# Patient Record
Sex: Female | Born: 1940 | Race: White | Hispanic: No | Marital: Single | Smoking: Former smoker
Health system: Southern US, Community
[De-identification: ages and names within clinical notes are randomized; demographics above are authoritative.]

## PROBLEM LIST (undated history)

## (undated) DIAGNOSIS — I10 Essential (primary) hypertension: Secondary | ICD-10-CM

## (undated) DIAGNOSIS — C801 Malignant (primary) neoplasm, unspecified: Secondary | ICD-10-CM

## (undated) DIAGNOSIS — C3490 Malignant neoplasm of unspecified part of unspecified bronchus or lung: Secondary | ICD-10-CM

## (undated) DIAGNOSIS — M1990 Unspecified osteoarthritis, unspecified site: Secondary | ICD-10-CM

## (undated) DIAGNOSIS — I509 Heart failure, unspecified: Secondary | ICD-10-CM

## (undated) DIAGNOSIS — J449 Chronic obstructive pulmonary disease, unspecified: Secondary | ICD-10-CM

## (undated) DIAGNOSIS — H9190 Unspecified hearing loss, unspecified ear: Secondary | ICD-10-CM

## (undated) DIAGNOSIS — K219 Gastro-esophageal reflux disease without esophagitis: Secondary | ICD-10-CM

## (undated) HISTORY — DX: Malignant (primary) neoplasm, unspecified: C80.1

## (undated) HISTORY — DX: Heart failure, unspecified: I50.9

## (undated) HISTORY — PX: CORONARY ARTERY BYPASS GRAFT: SHX141

## (undated) HISTORY — DX: Chronic obstructive pulmonary disease, unspecified: J44.9

## (undated) HISTORY — PX: CYST REMOVAL HAND: SHX6279

---

## 1991-08-01 HISTORY — PX: CARDIAC SURGERY: SHX584

## 2015-10-14 DIAGNOSIS — H905 Unspecified sensorineural hearing loss: Secondary | ICD-10-CM | POA: Insufficient documentation

## 2015-10-14 DIAGNOSIS — I25810 Atherosclerosis of coronary artery bypass graft(s) without angina pectoris: Secondary | ICD-10-CM | POA: Insufficient documentation

## 2015-10-14 DIAGNOSIS — J410 Simple chronic bronchitis: Secondary | ICD-10-CM | POA: Insufficient documentation

## 2015-12-28 DIAGNOSIS — K219 Gastro-esophageal reflux disease without esophagitis: Secondary | ICD-10-CM | POA: Insufficient documentation

## 2015-12-28 DIAGNOSIS — E669 Obesity, unspecified: Secondary | ICD-10-CM | POA: Insufficient documentation

## 2015-12-28 DIAGNOSIS — E782 Mixed hyperlipidemia: Secondary | ICD-10-CM | POA: Insufficient documentation

## 2015-12-28 DIAGNOSIS — G2581 Restless legs syndrome: Secondary | ICD-10-CM | POA: Insufficient documentation

## 2015-12-28 DIAGNOSIS — I10 Essential (primary) hypertension: Secondary | ICD-10-CM | POA: Insufficient documentation

## 2015-12-31 DIAGNOSIS — R1319 Other dysphagia: Secondary | ICD-10-CM | POA: Insufficient documentation

## 2015-12-31 DIAGNOSIS — R1310 Dysphagia, unspecified: Secondary | ICD-10-CM | POA: Insufficient documentation

## 2016-02-04 DIAGNOSIS — K449 Diaphragmatic hernia without obstruction or gangrene: Secondary | ICD-10-CM | POA: Insufficient documentation

## 2016-02-20 DIAGNOSIS — R5383 Other fatigue: Secondary | ICD-10-CM | POA: Insufficient documentation

## 2016-02-20 DIAGNOSIS — Z951 Presence of aortocoronary bypass graft: Secondary | ICD-10-CM | POA: Insufficient documentation

## 2016-02-21 DIAGNOSIS — E041 Nontoxic single thyroid nodule: Secondary | ICD-10-CM | POA: Insufficient documentation

## 2016-02-24 DIAGNOSIS — R269 Unspecified abnormalities of gait and mobility: Secondary | ICD-10-CM | POA: Insufficient documentation

## 2016-03-01 DIAGNOSIS — N183 Chronic kidney disease, stage 3 unspecified: Secondary | ICD-10-CM | POA: Insufficient documentation

## 2016-03-03 DIAGNOSIS — C7951 Secondary malignant neoplasm of bone: Secondary | ICD-10-CM | POA: Insufficient documentation

## 2016-03-08 ENCOUNTER — Other Ambulatory Visit: Payer: Self-pay

## 2016-03-08 DIAGNOSIS — C8590 Non-Hodgkin lymphoma, unspecified, unspecified site: Secondary | ICD-10-CM | POA: Insufficient documentation

## 2016-03-08 DIAGNOSIS — C8190 Hodgkin lymphoma, unspecified, unspecified site: Secondary | ICD-10-CM | POA: Insufficient documentation

## 2016-03-08 DIAGNOSIS — C8339 Diffuse large B-cell lymphoma, extranodal and solid organ sites: Secondary | ICD-10-CM | POA: Insufficient documentation

## 2016-03-28 ENCOUNTER — Encounter: Payer: Self-pay | Admitting: *Deleted

## 2016-03-30 ENCOUNTER — Inpatient Hospital Stay: Payer: Medicare HMO | Attending: Hematology and Oncology | Admitting: Hematology and Oncology

## 2016-03-30 ENCOUNTER — Inpatient Hospital Stay: Payer: Medicare HMO

## 2016-03-30 ENCOUNTER — Encounter (INDEPENDENT_AMBULATORY_CARE_PROVIDER_SITE_OTHER): Payer: Self-pay

## 2016-03-30 ENCOUNTER — Encounter: Payer: Self-pay | Admitting: Hematology and Oncology

## 2016-03-30 DIAGNOSIS — Z801 Family history of malignant neoplasm of trachea, bronchus and lung: Secondary | ICD-10-CM | POA: Diagnosis not present

## 2016-03-30 DIAGNOSIS — R410 Disorientation, unspecified: Secondary | ICD-10-CM

## 2016-03-30 DIAGNOSIS — C8510 Unspecified B-cell lymphoma, unspecified site: Secondary | ICD-10-CM | POA: Diagnosis present

## 2016-03-30 DIAGNOSIS — I2510 Atherosclerotic heart disease of native coronary artery without angina pectoris: Secondary | ICD-10-CM | POA: Diagnosis not present

## 2016-03-30 DIAGNOSIS — E041 Nontoxic single thyroid nodule: Secondary | ICD-10-CM

## 2016-03-30 DIAGNOSIS — Z87891 Personal history of nicotine dependence: Secondary | ICD-10-CM

## 2016-03-30 DIAGNOSIS — Z79899 Other long term (current) drug therapy: Secondary | ICD-10-CM

## 2016-03-30 DIAGNOSIS — R630 Anorexia: Secondary | ICD-10-CM

## 2016-03-30 DIAGNOSIS — R918 Other nonspecific abnormal finding of lung field: Secondary | ICD-10-CM | POA: Diagnosis not present

## 2016-03-30 DIAGNOSIS — M129 Arthropathy, unspecified: Secondary | ICD-10-CM | POA: Diagnosis not present

## 2016-03-30 DIAGNOSIS — Z7982 Long term (current) use of aspirin: Secondary | ICD-10-CM | POA: Diagnosis not present

## 2016-03-30 DIAGNOSIS — C8330 Diffuse large B-cell lymphoma, unspecified site: Secondary | ICD-10-CM

## 2016-03-30 DIAGNOSIS — N19 Unspecified kidney failure: Secondary | ICD-10-CM | POA: Diagnosis not present

## 2016-03-30 DIAGNOSIS — C8580 Other specified types of non-Hodgkin lymphoma, unspecified site: Secondary | ICD-10-CM

## 2016-03-30 DIAGNOSIS — R634 Abnormal weight loss: Secondary | ICD-10-CM

## 2016-03-30 DIAGNOSIS — D649 Anemia, unspecified: Secondary | ICD-10-CM

## 2016-03-30 DIAGNOSIS — E279 Disorder of adrenal gland, unspecified: Secondary | ICD-10-CM | POA: Diagnosis not present

## 2016-03-30 LAB — CBC WITH DIFFERENTIAL/PLATELET
Basophils Absolute: 0.1 10*3/uL (ref 0–0.1)
Basophils Relative: 1 %
Eosinophils Absolute: 0 10*3/uL (ref 0–0.7)
Eosinophils Relative: 0 %
HCT: 28.4 % — ABNORMAL LOW (ref 35.0–47.0)
Hemoglobin: 9.8 g/dL — ABNORMAL LOW (ref 12.0–16.0)
Lymphocytes Relative: 19 %
Lymphs Abs: 2.3 10*3/uL (ref 1.0–3.6)
MCH: 30.8 pg (ref 26.0–34.0)
MCHC: 34.4 g/dL (ref 32.0–36.0)
MCV: 89.3 fL (ref 80.0–100.0)
Monocytes Absolute: 1.2 10*3/uL — ABNORMAL HIGH (ref 0.2–0.9)
Monocytes Relative: 10 %
Neutro Abs: 8.7 10*3/uL — ABNORMAL HIGH (ref 1.4–6.5)
Neutrophils Relative %: 70 %
Platelets: 666 10*3/uL — ABNORMAL HIGH (ref 150–440)
RBC: 3.18 MIL/uL — ABNORMAL LOW (ref 3.80–5.20)
RDW: 14.6 % — ABNORMAL HIGH (ref 11.5–14.5)
WBC: 12.2 10*3/uL — ABNORMAL HIGH (ref 3.6–11.0)

## 2016-03-30 LAB — COMPREHENSIVE METABOLIC PANEL
ALT: 36 U/L (ref 14–54)
AST: 38 U/L (ref 15–41)
Albumin: 3 g/dL — ABNORMAL LOW (ref 3.5–5.0)
Alkaline Phosphatase: 116 U/L (ref 38–126)
Anion gap: 12 (ref 5–15)
BUN: 28 mg/dL — ABNORMAL HIGH (ref 6–20)
CO2: 22 mmol/L (ref 22–32)
Calcium: 10.6 mg/dL — ABNORMAL HIGH (ref 8.9–10.3)
Chloride: 97 mmol/L — ABNORMAL LOW (ref 101–111)
Creatinine, Ser: 1.2 mg/dL — ABNORMAL HIGH (ref 0.44–1.00)
GFR calc Af Amer: 51 mL/min — ABNORMAL LOW (ref >60)
GFR calc non Af Amer: 44 mL/min — ABNORMAL LOW (ref >60)
Glucose, Bld: 105 mg/dL — ABNORMAL HIGH (ref 65–99)
Potassium: 3.4 mmol/L — ABNORMAL LOW (ref 3.5–5.1)
Sodium: 131 mmol/L — ABNORMAL LOW (ref 135–145)
Total Bilirubin: 0.7 mg/dL (ref 0.3–1.2)
Total Protein: 6.8 g/dL (ref 6.5–8.1)

## 2016-03-30 LAB — LACTATE DEHYDROGENASE: LDH: 122 U/L (ref 98–192)

## 2016-03-30 LAB — APTT: aPTT: 36 seconds (ref 24–36)

## 2016-03-30 LAB — PROTIME-INR
INR: 1.01
Prothrombin Time: 13.3 seconds (ref 11.4–15.2)

## 2016-03-30 LAB — URIC ACID: Uric Acid, Serum: 4.5 mg/dL (ref 2.3–6.6)

## 2016-03-30 NOTE — Progress Notes (Signed)
.[facility] day:  03/30/2016  Chief Complaint: Toni Griffith is a 75 y.o. female with B cell lymphoma, unclassifiable, who is referred by Dr. Gwenevere Ghazi for assessment and management.  HPI:  The patient began to lose weight in 09/2015. She has lost about 52 pounds (25 - 30 pounds since 09/2015).  She denied any fevers or sweats.  She described a general decline in health.    She was admitted to [facility] in [place] from 02/20/2016 - 02/26/2016. She presented with hypercalcemia, hyperbilirubinemia, unintentional weight loss, renal failure and a 3.3 cm mass on chest x-ray.  She was felt to have lung cancer.  She had increased confusion and difficulty keeping food down for 1 1/2 months prior to admission. Initial calcium was 12. She was treated with IV fluids, calcitonin, discontinuation of hydrochlorothiazide, and Zometa.  At discharge, creatinine was 1.77, calcium 9.4, and albumin 2.9.  CBC revealed a hematocrit 26.4, hemoglobin 9.1, platelets 247,000, white count 9100.  Chest, abdomen and pelvic CT scan without contrast on 02/20/2016 revealed a 3.7 cm subpleural mass within the left lower lobe. There was a 1.1 cm right lower lobe subpleural nodule as well as a 9 mm nodule within the right upper lobe and 4 mm nodule within the lingula.  Between the left second, third, and fourth ribs there was 1.9 cm soft tissue mass. There was a 2.5 cm low-attenuation nodule in the inferior aspect of the left lobe of the thyroid.   There were prominent left axillary nodes measuring up to 1.0 cm.   There was no definite mediastinal adenopathy. There was a 5.5 x 4.6 cm cystic lesion in the left hepatic lobe.  There was a 3.1 cm nodule in the right adrenal gland. There was a 1.2 cm right retrocaval lymph node with additional prominent subcentimeter retroperitoneal nodes. There was 1.0 cm left common iliac node. There was a 6.2 x 5.2 cm soft tissue  mass within the right ilium.  Head MRI on 02/21/2016 revealed no evidence of malignancy. Thyroid ultrasound on 02/21/2016 revealed a 2.4 cm solid nodule in the inferior aspect of the left lobe  Following discharge, she was seen by Dr. Gwenevere Ghazi, pulmonologist.  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 A large pleural lesion invaded the anterior left chest wall.   There was metastasis to the superior left ocular orbit.  There was metastasis with a large lesion centered in the right iliac wing with soft tissue extension.  There was nodal metastasis with hypermetabolic right external iliac lymph nodes.  Right iliac wing bone/bone marrow biopsy on 03/08/2016 revealed B-cell lymphoma, unclassifiable, with features intermediate between diffuse large B-cell lymphoma and classical Hodgkin's lymphoma ("gray zone" lymphoma).  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  She cannot stand for long periods of time. She is somewhat unsteady on her feet, but has not fallen. She denies any balance or coordination issues. She needs help getting into the shower. She uses a shower chair.  She can dress on her own, but sometimes needs help with her pants. She spends 11 hours a day sleeping (she gets up at 8 AM and goes to sleep at 10 PM; she has an hour nap during the day). She sits most of the day and watches television.   Past Medical History:  Diagnosis Date  .  Cancer [facility])     Past Surgical History:  Procedure Laterality Date  . CARDIAC SURGERY  1993    Family History  Problem Relation Age of Onset  . Lung cancer Mother   . Lung cancer Father   . Diabetes Maternal Uncle   . Myasthenia gravis Maternal Grandmother   . Leukemia Maternal Grandfather     Social History:  reports that she has quit smoking. She has never used smokeless tobacco. She reports that she  drinks alcohol. Her drug history is not on file.  She smoked 1 1/2 packs per day for 20 years (30 pack year smoking history) until 3 months ago.  She is hearing impaired (deaf).  She lives in [place] with her boyfriend.  Her daughter lives in [place]. The patient is accompanied by her daughter [name]; mobile: [phone]; home: [phone]), son [name]), boyfriend [name]), and interpreter (sign language) today.  Allergies: No Known Allergies  Current Medications: Current Outpatient Prescriptions  Medication Sig Dispense Refill  . acetaminophen (TYLENOL) 325 MG tablet Take 650 mg by mouth.    Marland Kitchen atorvastatin (LIPITOR) 80 MG tablet Take 80 mg by mouth.    . Cholecalciferol (VITAMIN D3) 2000 units capsule Take by mouth.    Marland Kitchen FLUoxetine (PROZAC) 40 MG capsule Take 40 mg by mouth.    . Garlic 4982 MG CAPS Take by mouth.    . naproxen sodium (ANAPROX) 220 MG tablet Take 220 mg by mouth as needed.    . pantoprazole (PROTONIX) 40 MG tablet Take 40 mg by mouth.    . triamterene-hydrochlorothiazide (DYAZIDE) 37.5-25 MG capsule Take by mouth.    . vitamin E 400 UNIT capsule Take by mouth.    Marland Kitchen aspirin (GOODSENSE ASPIRIN) 325 MG tablet Take 325 mg by mouth.    Marland Kitchen aspirin EC 81 MG tablet Take by mouth.     No current facility-administered medications for this visit.     Review of Systems:  GENERAL:  Fatigue.  Minimally active.  No fevers or sweats.  Weight loss of 52 pounds (25-30 pound since 09/2015). PERFORMANCE STATUS (ECOG):  2-3 HEENT:  No visual changes, runny nose, sore throat, mouth sores or tenderness. Lungs: No shortness of breath or cough.  No hemoptysis. Cardiac:  No chest pain, palpitations, orthopnea, or PND. GI:  Nausea and vomiting, resolved.  No diarrhea, constipation, melena or hematochezia. GU:  No urgency, frequency, dysuria, or hematuria. Musculoskeletal:  Bilateral leg pain (left > right).  Pain in knees and feet.  No back pain.  No muscle tenderness. Extremities:   No pain or swelling. Skin:  No rashes or skin changes. Neuro:  No headache, numbness or weakness, balance or coordination issues. Endocrine:  No diabetes, thyroid issues, hot flashes or night sweats. Psych:  No mood changes, depression or anxiety. Pain:  No focal pain. Review of systems:  All other systems reviewed and found to be negative.  Physical Exam: Blood pressure 126/81, pulse 79, temperature 98.3 F (36.8 C), temperature source Oral, resp. rate 18, height '5\' 2"'  (1.575 m), weight 145 lb 15.1 oz (66.2 kg). GENERAL:  Elderly sitting comfortably in a wheelchair in the exam room in no acute distress.  Slow gait.  She needs assistance onto the table. MENTAL STATUS:  Alert and oriented to person, place and time. HEAD: Curly gray hair.  Normocephalic, atraumatic, face symmetric, no Cushingoid features. EYES:  Hazel eyes.  Pupils equal round and reactive to light and accomodation.  No conjunctivitis or scleral icterus.  ENT:  Oropharynx clear without lesion.  Upper dentures.  Tongue normal. Mucous membranes moist.  RESPIRATORY:  Clear to auscultation without rales, wheezes or rhonchi. CARDIOVASCULAR:  Regular rate and rhythm without murmur, rub or gallop. ABDOMEN:  Soft, non-tender, with active bowel sounds, and no appreciable hepatosplenomegaly.  No masses. SKIN:  No rashes, ulcers or lesions. EXTREMITIES: No edema, no skin discoloration or tenderness.  No palpable cords. LYMPH NODES: Small < 1 cm axillary nodes.  Shotty inguinal adenopathy.  No palpable cervical, supraclavicular adenopathy  NEUROLOGICAL: Alert & oriented, cranial nerves II-XII intact; motor strength 5/5 throughout; sensation intact; finger to nose and RAM normal; slow gait. PSYCH:  Appropriate.   No visits with results within 3 Day(s) from this visit.  Latest known visit with results is:  No results found for any previous visit.    Assessment:  Toni Griffith is a 75 y.o. female with stage IVB B-cell lymphoma,  unclassifiable, with features intermediate between diffuse large B-cell lymphoma and classical Hodgkin's lymphoma ("gray zone" lymphoma).  She underwent right iliac wing bone/bone marrow biopsy on 03/08/2016.  Chest, abdomen and pelvic CT scan without contrast on 02/20/2016 revealed a 3.7 cm subpleural mass within the left lower lobe, a 1.1 cm right lower lobe subpleural nodule , 9 mm RUL nodule, and 0.4 cm lingular nodule.  Between the left second, third, and fourth ribs there was 1.9 cm soft tissue mass. There was a 2.5 cm low-attenuation nodule in the left lobe of the thyroid.   There were prominent left axillary nodes (up to 1.0 cm).   There was no definite mediastinal adenopathy. There was a 5.5 x 4.6 cm cystic lesion in the left hepatic lobe.  There was a 3.1 cm nodule in the right adrenal gland. There was a 1.2 cm right retrocaval lymph node with additional prominent subcentimeter retroperitoneal nodes. There was 1.0 cm left common iliac node. There was a 6.2 x 5.2 cm soft tissue mass within the right ilium.  Head MRI on 02/21/2016 revealed no evidence of malignancy. 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   There was activity in the superior left ocular orbit.  There was a large lesion centered in the right iliac wing with soft tissue extension.  There were hypermetabolic right external iliac lymph nodes  She was admitted to [facility].  She had renal failure and hypercalcemia. She was treated with IVF, calcitonin and Zometa.The patient has a history of coronary artery disease s/p CABG in 1993.  She has no history of heart failure.  Symptomatically, she has lost 52 pounds (25 - 30 pounds since 09/2015).  She has had a  general decline in health.  Plan: 1.  Review diagnosis, staging, and management of lymphoma.  Discuss unusual pathology.  Slides have been requested for review and confirmation of diagnosis.  Based on bone and bone marrow disease and symptoms (weight loss), she has stage IVB disease.  Discuss treatment with RCHOP or EPOCH.  Given patient's age and performance status, discuss consideration of mini-RCHOP with dose advancement as tolerated.  Discuss possible LP to assess for CNS involvement (given diffuse large B cell lymphoma with marrow involvement) and consideration of CNS prophylaxis (MTX).    Discuss reviewing outside imaging.  Discuss  RCHOP/mini-RCHOP.  Discuss chemotherapy every 3 weeks with Neulasta support.  Side effects of chemotherapy reviewed in detail.  Schedule chemotherapy teaching.  Discuss port-a-cath placement.  Discuss issues with tumor lysis syndrome.  Discuss importance of hydration and allopurinol.  Discuss management of nausea.  2.  Labs today:  CBC with diff, CMP, LDH, uric acid, G6PD assay, hepatitis B core antibody total, hepatitis B surface antigen, hepatitis C antibody, PT, PTT. 3.  Schedule echo:  assess EF prior to chemo. 4.  Schedule port-a-cath placement 5.  Chemo class- mini RCHOP 6.  Pathology request from [facility]. 7.  RTC in 1 week for MD assess, review of testing, and RCHOP the next day.   Lequita Asal, MD  03/30/2016, 12:32 PM

## 2016-03-31 ENCOUNTER — Encounter: Payer: Self-pay | Admitting: *Deleted

## 2016-03-31 ENCOUNTER — Telehealth: Payer: Self-pay | Admitting: *Deleted

## 2016-03-31 ENCOUNTER — Other Ambulatory Visit: Payer: Self-pay | Admitting: Hematology and Oncology

## 2016-03-31 LAB — GLUCOSE 6 PHOSPHATE DEHYDROGENASE
G6PDH: 10.4 U/g{Hb} (ref 4.6–13.5)
Hemoglobin: 9.8 g/dL — ABNORMAL LOW (ref 11.1–15.9)

## 2016-03-31 LAB — HEPATITIS B SURFACE ANTIGEN: Hepatitis B Surface Ag: NEGATIVE

## 2016-03-31 LAB — HEPATITIS B CORE ANTIBODY, TOTAL: Hep B Core Total Ab: NEGATIVE

## 2016-03-31 LAB — HEPATITIS C ANTIBODY: HCV Ab: <0.1 s/co ratio (ref 0.0–0.9)

## 2016-03-31 NOTE — Telephone Encounter (Signed)
Called to report that her correct dose of ASA is 325 mg daily

## 2016-04-04 ENCOUNTER — Inpatient Hospital Stay: Payer: Medicare HMO | Attending: Hematology and Oncology

## 2016-04-04 ENCOUNTER — Other Ambulatory Visit: Payer: Self-pay | Admitting: Vascular Surgery

## 2016-04-04 ENCOUNTER — Ambulatory Visit
Admission: RE | Admit: 2016-04-04 | Discharge: 2016-04-04 | Disposition: A | Discharge status: Home / Self Care | Payer: Medicare HMO | Source: Ambulatory Visit | Attending: Hematology and Oncology | Admitting: Hematology and Oncology

## 2016-04-04 DIAGNOSIS — R509 Fever, unspecified: Secondary | ICD-10-CM | POA: Insufficient documentation

## 2016-04-04 DIAGNOSIS — C8580 Other specified types of non-Hodgkin lymphoma, unspecified site: Secondary | ICD-10-CM

## 2016-04-04 DIAGNOSIS — Z87891 Personal history of nicotine dependence: Secondary | ICD-10-CM | POA: Diagnosis not present

## 2016-04-04 DIAGNOSIS — I517 Cardiomegaly: Secondary | ICD-10-CM | POA: Insufficient documentation

## 2016-04-04 DIAGNOSIS — Z7982 Long term (current) use of aspirin: Secondary | ICD-10-CM | POA: Insufficient documentation

## 2016-04-04 DIAGNOSIS — R05 Cough: Secondary | ICD-10-CM | POA: Insufficient documentation

## 2016-04-04 DIAGNOSIS — C8518 Unspecified B-cell lymphoma, lymph nodes of multiple sites: Secondary | ICD-10-CM | POA: Insufficient documentation

## 2016-04-04 DIAGNOSIS — I2510 Atherosclerotic heart disease of native coronary artery without angina pectoris: Secondary | ICD-10-CM | POA: Insufficient documentation

## 2016-04-04 DIAGNOSIS — I340 Nonrheumatic mitral (valve) insufficiency: Secondary | ICD-10-CM | POA: Insufficient documentation

## 2016-04-04 DIAGNOSIS — I071 Rheumatic tricuspid insufficiency: Secondary | ICD-10-CM | POA: Insufficient documentation

## 2016-04-04 DIAGNOSIS — C8330 Diffuse large B-cell lymphoma, unspecified site: Secondary | ICD-10-CM

## 2016-04-04 DIAGNOSIS — Z5111 Encounter for antineoplastic chemotherapy: Secondary | ICD-10-CM | POA: Insufficient documentation

## 2016-04-04 DIAGNOSIS — H9193 Unspecified hearing loss, bilateral: Secondary | ICD-10-CM | POA: Insufficient documentation

## 2016-04-04 DIAGNOSIS — N19 Unspecified kidney failure: Secondary | ICD-10-CM | POA: Insufficient documentation

## 2016-04-04 DIAGNOSIS — Z5112 Encounter for antineoplastic immunotherapy: Secondary | ICD-10-CM | POA: Insufficient documentation

## 2016-04-04 DIAGNOSIS — Z79899 Other long term (current) drug therapy: Secondary | ICD-10-CM | POA: Insufficient documentation

## 2016-04-04 DIAGNOSIS — R918 Other nonspecific abnormal finding of lung field: Secondary | ICD-10-CM | POA: Insufficient documentation

## 2016-04-04 DIAGNOSIS — R634 Abnormal weight loss: Secondary | ICD-10-CM | POA: Insufficient documentation

## 2016-04-04 DIAGNOSIS — Z801 Family history of malignant neoplasm of trachea, bronchus and lung: Secondary | ICD-10-CM | POA: Insufficient documentation

## 2016-04-04 DIAGNOSIS — D649 Anemia, unspecified: Secondary | ICD-10-CM | POA: Insufficient documentation

## 2016-04-04 DIAGNOSIS — E876 Hypokalemia: Secondary | ICD-10-CM | POA: Insufficient documentation

## 2016-04-04 NOTE — Progress Notes (Signed)
  Echocardiogram 2D Echocardiogram has been performed.  Jennette Dubin 04/04/2016, 12:09 PM

## 2016-04-04 NOTE — Patient Instructions (Signed)
Rituximab injection What is this medicine? RITUXIMAB (ri TUX i mab) is a monoclonal antibody. It is used commonly to treat non-Hodgkin lymphoma and other conditions. It is also used to treat rheumatoid arthritis (RA). In RA, this medicine slows the inflammatory process and help reduce joint pain and swelling. This medicine is often used with other cancer or arthritis medications. This medicine may be used for other purposes; ask your health care provider or pharmacist if you have questions. What should I tell my health care provider before I take this medicine? They need to know if you have any of these conditions: -blood disorders -heart disease -history of hepatitis B -infection (especially a virus infection such as chickenpox, cold sores, or herpes) -irregular heartbeat -kidney disease -lung or breathing disease, like asthma -lupus -an unusual or allergic reaction to rituximab, mouse proteins, other medicines, foods, dyes, or preservatives -pregnant or trying to get pregnant -breast-feeding How should I use this medicine? This medicine is for infusion into a vein. It is administered in a hospital or clinic by a specially trained health care professional. A special MedGuide will be given to you by the pharmacist with each prescription and refill. Be sure to read this information carefully each time. Talk to your pediatrician regarding the use of this medicine in children. This medicine is not approved for use in children. Overdosage: If you think you have taken too much of this medicine contact a poison control center or emergency room at once. NOTE: This medicine is only for you. Do not share this medicine with others. What if I miss a dose? It is important not to miss a dose. Call your doctor or health care professional if you are unable to keep an appointment. What may interact with this medicine? -cisplatin -medicines for blood pressure -some other medicines for  arthritis -vaccines This list may not describe all possible interactions. Give your health care provider a list of all the medicines, herbs, non-prescription drugs, or dietary supplements you use. Also tell them if you smoke, drink alcohol, or use illegal drugs. Some items may interact with your medicine. What should I watch for while using this medicine? Report any side effects that you notice during your treatment right away, such as changes in your breathing, fever, chills, dizziness or lightheadedness. These effects are more common with the first dose. Visit your prescriber or health care professional for checks on your progress. You will need to have regular blood work. Report any other side effects. The side effects of this medicine can continue after you finish your treatment. Continue your course of treatment even though you feel ill unless your doctor tells you to stop. Call your doctor or health care professional for advice if you get a fever, chills or sore throat, or other symptoms of a cold or flu. Do not treat yourself. This drug decreases your body's ability to fight infections. Try to avoid being around people who are sick. This medicine may increase your risk to bruise or bleed. Call your doctor or health care professional if you notice any unusual bleeding. Be careful brushing and flossing your teeth or using a toothpick because you may get an infection or bleed more easily. If you have any dental work done, tell your dentist you are receiving this medicine. Avoid taking products that contain aspirin, acetaminophen, ibuprofen, naproxen, or ketoprofen unless instructed by your doctor. These medicines may hide a fever. Do not become pregnant while taking this medicine. Women should inform their doctor if  they wish to become pregnant or think they might be pregnant. There is a potential for serious side effects to an unborn child. Talk to your health care professional or pharmacist for more  information. Do not breast-feed an infant while taking this medicine. What side effects may I notice from receiving this medicine? Side effects that you should report to your doctor or health care professional as soon as possible: -allergic reactions like skin rash, itching or hives, swelling of the face, lips, or tongue -low blood counts - this medicine may decrease the number of white blood cells, red blood cells and platelets. You may be at increased risk for infections and bleeding. -signs of infection - fever or chills, cough, sore throat, pain or difficulty passing urine -signs of decreased platelets or bleeding - bruising, pinpoint red spots on the skin, black, tarry stools, blood in the urine -signs of decreased red blood cells - unusually weak or tired, fainting spells, lightheadedness -breathing problems -confused, not responsive -chest pain -fast, irregular heartbeat -feeling faint or lightheaded, falls -mouth sores -redness, blistering, peeling or loosening of the skin, including inside the mouth -stomach pain -swelling of the ankles, feet, or hands -trouble passing urine or change in the amount of urine Side effects that usually do not require medical attention (report to your doctor or other health care professional if they continue or are bothersome): -anxiety -headache -loss of appetite -muscle aches -nausea -night sweats This list may not describe all possible side effects. Call your doctor for medical advice about side effects. You may report side effects to FDA at 1-800-FDA-1088. Where should I keep my medicine? This drug is given in a hospital or clinic and will not be stored at home. NOTE: This sheet is a summary. It may not cover all possible information. If you have questions about this medicine, talk to your doctor, pharmacist, or health care provider.    2016, [employer]. (2014-09-23 22:30:56) Vincristine injection What is this  medicine? VINCRISTINE (vin KRIS teen) is a chemotherapy drug. It slows the growth of cancer cells. This medicine is used to treat many types of cancer like Hodgkin's disease, leukemia, non-Hodgkin's lymphoma, neuroblastoma (brain cancer), rhabdomyosarcoma, and Wilms' tumor. This medicine may be used for other purposes; ask your health care provider or pharmacist if you have questions. What should I tell my health care provider before I take this medicine? They need to know if you have any of these conditions: -blood disorders -gout -infection (especially chickenpox, cold sores, or herpes) -kidney disease -liver disease -lung disease -nervous system disease like Charcot-Marie-Tooth (CMT) -recent or ongoing radiation therapy -an unusual or allergic reaction to vincristine, other chemotherapy agents, other medicines, foods, dyes, or preservatives -pregnant or trying to get pregnant -breast-feeding How should I use this medicine? This drug is given as an infusion into a vein. It is administered in a hospital or clinic by a specially trained health care professional. If you have pain, swelling, burning, or any unusual feeling around the site of your injection, tell your health care professional right away. Talk to your pediatrician regarding the use of this medicine in children. While this drug may be prescribed for selected conditions, precautions do apply. Overdosage: If you think you have taken too much of this medicine contact a poison control center or emergency room at once. NOTE: This medicine is only for you. Do not share this medicine with others. What if I miss a dose? It is important not to miss your dose. Call  your doctor or health care professional if you are unable to keep an appointment. What may interact with this medicine? Do not take this medicine with any of the following medications: -itraconazole -mibefradil -voriconazole This medicine may also interact with the following  medications: -cyclosporine -erythromycin -fluconazole -ketoconazole -medicines for HIV like delavirdine, efavirenz, nevirapine -medicines for seizures like ethotoin, fosphenotoin, phenytoin -medicines to increase blood counts like filgrastim, pegfilgrastim, sargramostim -other chemotherapy drugs like cisplatin, L-asparaginase, methotrexate, mitomycin, paclitaxel -pegaspargase -vaccines -zalcitabine, ddC Talk to your doctor or health care professional before taking any of these medicines: -acetaminophen -aspirin -ibuprofen -ketoprofen -naproxen This list may not describe all possible interactions. Give your health care provider a list of all the medicines, herbs, non-prescription drugs, or dietary supplements you use. Also tell them if you smoke, drink alcohol, or use illegal drugs. Some items may interact with your medicine. What should I watch for while using this medicine? Your condition will be monitored carefully while you are receiving this medicine. You will need important blood work done while you are taking this medicine. This drug may make you feel generally unwell. This is not uncommon, as chemotherapy can affect healthy cells as well as cancer cells. Report any side effects. Continue your course of treatment even though you feel ill unless your doctor tells you to stop. In some cases, you may be given additional medicines to help with side effects. Follow all directions for their use. Call your doctor or health care professional for advice if you get a fever, chills or sore throat, or other symptoms of a cold or flu. Do not treat yourself. Avoid taking products that contain aspirin, acetaminophen, ibuprofen, naproxen, or ketoprofen unless instructed by your doctor. These medicines may hide a fever. Do not become pregnant while taking this medicine. Women should inform their doctor if they wish to become pregnant or think they might be pregnant. There is a potential for serious  side effects to an unborn child. Talk to your health care professional or pharmacist for more information. Do not breast-feed an infant while taking this medicine. Men may have a lower sperm count while taking this medicine. Talk to your doctor if you plan to father a child. What side effects may I notice from receiving this medicine? Side effects that you should report to your doctor or health care professional as soon as possible: -allergic reactions like skin rash, itching or hives, swelling of the face, lips, or tongue -breathing problems -confusion or changes in emotions or moods -constipation -cough -mouth sores -muscle weakness -nausea and vomiting -pain, swelling, redness or irritation at the injection site -pain, tingling, numbness in the hands or feet -problems with balance, talking, walking -seizures -stomach pain -trouble passing urine or change in the amount of urine Side effects that usually do not require medical attention (report to your doctor or health care professional if they continue or are bothersome): -diarrhea -hair loss -jaw pain -loss of appetite This list may not describe all possible side effects. Call your doctor for medical advice about side effects. You may report side effects to FDA at 1-800-FDA-1088. Where should I keep my medicine? This drug is given in a hospital or clinic and will not be stored at home. NOTE: This sheet is a summary. It may not cover all possible information. If you have questions about this medicine, talk to your doctor, pharmacist, or health care provider.    2016, [employer] Standard. (2008-04-13 17:17:13) Doxorubicin injection What is this medicine? DOXORUBICIN (dox oh  ROO bi sin) is a chemotherapy drug. It is used to treat many kinds of cancer like Hodgkin's disease, leukemia, non-Hodgkin's lymphoma, neuroblastoma, sarcoma, and Wilms' tumor. It is also used to treat bladder cancer, breast cancer, lung cancer, ovarian cancer,  stomach cancer, and thyroid cancer. This medicine may be used for other purposes; ask your health care provider or pharmacist if you have questions. What should I tell my health care provider before I take this medicine? They need to know if you have any of these conditions: -blood disorders -heart disease, recent heart attack -infection (especially a virus infection such as chickenpox, cold sores, or herpes) -irregular heartbeat -liver disease -recent or ongoing radiation therapy -an unusual or allergic reaction to doxorubicin, other chemotherapy agents, other medicines, foods, dyes, or preservatives -pregnant or trying to get pregnant -breast-feeding How should I use this medicine? This drug is given as an infusion into a vein. It is administered in a hospital or clinic by a specially trained health care professional. If you have pain, swelling, burning or any unusual feeling around the site of your injection, tell your health care professional right away. Talk to your pediatrician regarding the use of this medicine in children. Special care may be needed. Overdosage: If you think you have taken too much of this medicine contact a poison control center or emergency room at once. NOTE: This medicine is only for you. Do not share this medicine with others. What if I miss a dose? It is important not to miss your dose. Call your doctor or health care professional if you are unable to keep an appointment. What may interact with this medicine? Do not take this medicine with any of the following medications: -cisapride -droperidol -halofantrine -pimozide -zidovudine This medicine may also interact with the following medications: -chloroquine -chlorpromazine -clarithromycin -cyclophosphamide -cyclosporine -erythromycin -medicines for depression, anxiety, or psychotic disturbances -medicines for irregular heart beat like amiodarone, bepridil, dofetilide, encainide, flecainide,  propafenone, quinidine -medicines for seizures like ethotoin, fosphenytoin, phenytoin -medicines for nausea, vomiting like dolasetron, ondansetron, palonosetron -medicines to increase blood counts like filgrastim, pegfilgrastim, sargramostim -methadone -methotrexate -pentamidine -progesterone -vaccines -verapamil Talk to your doctor or health care professional before taking any of these medicines: -acetaminophen -aspirin -ibuprofen -ketoprofen -naproxen This list may not describe all possible interactions. Give your health care provider a list of all the medicines, herbs, non-prescription drugs, or dietary supplements you use. Also tell them if you smoke, drink alcohol, or use illegal drugs. Some items may interact with your medicine. What should I watch for while using this medicine? Your condition will be monitored carefully while you are receiving this medicine. You will need important blood work done while you are taking this medicine. This drug may make you feel generally unwell. This is not uncommon, as chemotherapy can affect healthy cells as well as cancer cells. Report any side effects. Continue your course of treatment even though you feel ill unless your doctor tells you to stop. Your urine may turn red for a few days after your dose. This is not blood. If your urine is dark or brown, call your doctor. In some cases, you may be given additional medicines to help with side effects. Follow all directions for their use. Call your doctor or health care professional for advice if you get a fever, chills or sore throat, or other symptoms of a cold or flu. Do not treat yourself. This drug decreases your body's ability to fight infections. Try to avoid being around people who  are sick. This medicine may increase your risk to bruise or bleed. Call your doctor or health care professional if you notice any unusual bleeding. Be careful brushing and flossing your teeth or using a toothpick  because you may get an infection or bleed more easily. If you have any dental work done, tell your dentist you are receiving this medicine. Avoid taking products that contain aspirin, acetaminophen, ibuprofen, naproxen, or ketoprofen unless instructed by your doctor. These medicines may hide a fever. Men and women of childbearing age should use effective birth control methods while using taking this medicine. Do not become pregnant while taking this medicine. There is a potential for serious side effects to an unborn child. Talk to your health care professional or pharmacist for more information. Do not breast-feed an infant while taking this medicine. Do not let others touch your urine or other body fluids for 5 days after each treatment with this medicine. Caregivers should wear latex gloves to avoid touching body fluids during this time. There is a maximum amount of this medicine you should receive throughout your life. The amount depends on the medical condition being treated and your overall health. Your doctor will watch how much of this medicine you receive in your lifetime. Tell your doctor if you have taken this medicine before. What side effects may I notice from receiving this medicine? Side effects that you should report to your doctor or health care professional as soon as possible: -allergic reactions like skin rash, itching or hives, swelling of the face, lips, or tongue -low blood counts - this medicine may decrease the number of white blood cells, red blood cells and platelets. You may be at increased risk for infections and bleeding. -signs of infection - fever or chills, cough, sore throat, pain or difficulty passing urine -signs of decreased platelets or bleeding - bruising, pinpoint red spots on the skin, black, tarry stools, blood in the urine -signs of decreased red blood cells - unusually weak or tired, fainting spells, lightheadedness -breathing problems -chest pain -fast,  irregular heartbeat -mouth sores -nausea, vomiting -pain, swelling, redness at site where injected -pain, tingling, numbness in the hands or feet -swelling of ankles, feet, or hands -unusual bleeding or bruising Side effects that usually do not require medical attention (report to your doctor or health care professional if they continue or are bothersome): -diarrhea -facial flushing -hair loss -loss of appetite -missed menstrual periods -nail discoloration or damage -red or watery eyes -red colored urine -stomach upset This list may not describe all possible side effects. Call your doctor for medical advice about side effects. You may report side effects to FDA at 1-800-FDA-1088. Where should I keep my medicine? This drug is given in a hospital or clinic and will not be stored at home. NOTE: This sheet is a summary. It may not cover all possible information. If you have questions about this medicine, talk to your doctor, pharmacist, or health care provider.    2016, [employer] Standard. (2012-11-12 09:54:34) Cyclophosphamide injection What is this medicine? CYCLOPHOSPHAMIDE (sye kloe FOSS fa mide) is a chemotherapy drug. It slows the growth of cancer cells. This medicine is used to treat many types of cancer like lymphoma, myeloma, leukemia, breast cancer, and ovarian cancer, to name a few. This medicine may be used for other purposes; ask your health care provider or pharmacist if you have questions. What should I tell my health care provider before I take this medicine? They need to know if you  have any of these conditions: -blood disorders -history of other chemotherapy -infection -kidney disease -liver disease -recent or ongoing radiation therapy -tumors in the bone marrow -an unusual or allergic reaction to cyclophosphamide, other chemotherapy, other medicines, foods, dyes, or preservatives -pregnant or trying to get pregnant -breast-feeding How should I use this  medicine? This drug is usually given as an injection into a vein or muscle or by infusion into a vein. It is administered in a hospital or clinic by a specially trained health care professional. Talk to your pediatrician regarding the use of this medicine in children. Special care may be needed. Overdosage: If you think you have taken too much of this medicine contact a poison control center or emergency room at once. NOTE: This medicine is only for you. Do not share this medicine with others. What if I miss a dose? It is important not to miss your dose. Call your doctor or health care professional if you are unable to keep an appointment. What may interact with this medicine? This medicine may interact with the following medications: -amiodarone -amphotericin B -azathioprine -certain antiviral medicines for HIV or AIDS such as protease inhibitors (e.g., indinavir, ritonavir) and zidovudine -certain blood pressure medications such as benazepril, captopril, enalapril, fosinopril, lisinopril, moexipril, monopril, perindopril, quinapril, ramipril, trandolapril -certain cancer medications such as anthracyclines (e.g., daunorubicin, doxorubicin), busulfan, cytarabine, paclitaxel, pentostatin, tamoxifen, trastuzumab -certain diuretics such as chlorothiazide, chlorthalidone, hydrochlorothiazide, indapamide, metolazone -certain medicines that treat or prevent blood clots like warfarin -certain muscle relaxants such as succinylcholine -cyclosporine -etanercept -indomethacin -medicines to increase blood counts like filgrastim, pegfilgrastim, sargramostim -medicines used as general anesthesia -metronidazole -natalizumab This list may not describe all possible interactions. Give your health care provider a list of all the medicines, herbs, non-prescription drugs, or dietary supplements you use. Also tell them if you smoke, drink alcohol, or use illegal drugs. Some items may interact with your  medicine. What should I watch for while using this medicine? Visit your doctor for checks on your progress. This drug may make you feel generally unwell. This is not uncommon, as chemotherapy can affect healthy cells as well as cancer cells. Report any side effects. Continue your course of treatment even though you feel ill unless your doctor tells you to stop. Drink water or other fluids as directed. Urinate often, even at night. In some cases, you may be given additional medicines to help with side effects. Follow all directions for their use. Call your doctor or health care professional for advice if you get a fever, chills or sore throat, or other symptoms of a cold or flu. Do not treat yourself. This drug decreases your body's ability to fight infections. Try to avoid being around people who are sick. This medicine may increase your risk to bruise or bleed. Call your doctor or health care professional if you notice any unusual bleeding. Be careful brushing and flossing your teeth or using a toothpick because you may get an infection or bleed more easily. If you have any dental work done, tell your dentist you are receiving this medicine. You may get drowsy or dizzy. Do not drive, use machinery, or do anything that needs mental alertness until you know how this medicine affects you. Do not become pregnant while taking this medicine or for 1 year after stopping it. Women should inform their doctor if they wish to become pregnant or think they might be pregnant. Men should not father a child while taking this medicine and for 4 months  after stopping it. There is a potential for serious side effects to an unborn child. Talk to your health care professional or pharmacist for more information. Do not breast-feed an infant while taking this medicine. This medicine may interfere with the ability to have a child. This medicine has caused ovarian failure in some women. This medicine has caused reduced sperm  counts in some men. You should talk with your doctor or health care professional if you are concerned about your fertility. If you are going to have surgery, tell your doctor or health care professional that you have taken this medicine. What side effects may I notice from receiving this medicine? Side effects that you should report to your doctor or health care professional as soon as possible: -allergic reactions like skin rash, itching or hives, swelling of the face, lips, or tongue -low blood counts - this medicine may decrease the number of white blood cells, red blood cells and platelets. You may be at increased risk for infections and bleeding. -signs of infection - fever or chills, cough, sore throat, pain or difficulty passing urine -signs of decreased platelets or bleeding - bruising, pinpoint red spots on the skin, black, tarry stools, blood in the urine -signs of decreased red blood cells - unusually weak or tired, fainting spells, lightheadedness -breathing problems -dark urine -dizziness -palpitations -swelling of the ankles, feet, hands -trouble passing urine or change in the amount of urine -weight gain -yellowing of the eyes or skin Side effects that usually do not require medical attention (report to your doctor or health care professional if they continue or are bothersome): -changes in nail or skin color -hair loss -missed menstrual periods -mouth sores -nausea, vomiting This list may not describe all possible side effects. Call your doctor for medical advice about side effects. You may report side effects to FDA at 1-800-FDA-1088. Where should I keep my medicine? This drug is given in a hospital or clinic and will not be stored at home. NOTE: This sheet is a summary. It may not cover all possible information. If you have questions about this medicine, talk to your doctor, pharmacist, or health care provider.    2016, Elsevier/Gold Standard. (2012-05-31 16:22:58)

## 2016-04-05 ENCOUNTER — Other Ambulatory Visit: Payer: Self-pay | Admitting: *Deleted

## 2016-04-05 DIAGNOSIS — C8580 Other specified types of non-Hodgkin lymphoma, unspecified site: Principal | ICD-10-CM

## 2016-04-05 DIAGNOSIS — C8330 Diffuse large B-cell lymphoma, unspecified site: Secondary | ICD-10-CM

## 2016-04-06 ENCOUNTER — Telehealth: Payer: Self-pay | Admitting: *Deleted

## 2016-04-06 ENCOUNTER — Other Ambulatory Visit: Payer: Self-pay | Admitting: *Deleted

## 2016-04-06 ENCOUNTER — Encounter: Payer: Self-pay | Admitting: Hematology and Oncology

## 2016-04-06 ENCOUNTER — Inpatient Hospital Stay (HOSPITAL_BASED_OUTPATIENT_CLINIC_OR_DEPARTMENT_OTHER): Payer: Medicare HMO | Admitting: Hematology and Oncology

## 2016-04-06 ENCOUNTER — Encounter: Payer: Self-pay | Admitting: *Deleted

## 2016-04-06 ENCOUNTER — Encounter
Admission: RE | Disposition: A | Discharge status: Home / Self Care | Payer: Self-pay | Source: Ambulatory Visit | Attending: Vascular Surgery

## 2016-04-06 ENCOUNTER — Inpatient Hospital Stay: Payer: Medicare HMO

## 2016-04-06 ENCOUNTER — Ambulatory Visit
Admission: RE | Admit: 2016-04-06 | Discharge: 2016-04-06 | Disposition: A | Discharge status: Home / Self Care | Payer: Medicare HMO | Source: Ambulatory Visit | Attending: Vascular Surgery | Admitting: Vascular Surgery

## 2016-04-06 VITALS — BP 133/79 | HR 77 | Temp 95.3°F | Resp 18 | Wt 144.0 lb

## 2016-04-06 DIAGNOSIS — Z87891 Personal history of nicotine dependence: Secondary | ICD-10-CM | POA: Insufficient documentation

## 2016-04-06 DIAGNOSIS — C8330 Diffuse large B-cell lymphoma, unspecified site: Secondary | ICD-10-CM

## 2016-04-06 DIAGNOSIS — R509 Fever, unspecified: Secondary | ICD-10-CM | POA: Diagnosis not present

## 2016-04-06 DIAGNOSIS — N19 Unspecified kidney failure: Secondary | ICD-10-CM | POA: Diagnosis not present

## 2016-04-06 DIAGNOSIS — Z8489 Family history of other specified conditions: Secondary | ICD-10-CM | POA: Diagnosis not present

## 2016-04-06 DIAGNOSIS — F17210 Nicotine dependence, cigarettes, uncomplicated: Secondary | ICD-10-CM | POA: Diagnosis not present

## 2016-04-06 DIAGNOSIS — I2510 Atherosclerotic heart disease of native coronary artery without angina pectoris: Secondary | ICD-10-CM

## 2016-04-06 DIAGNOSIS — C8198 Hodgkin lymphoma, unspecified, lymph nodes of multiple sites: Secondary | ICD-10-CM

## 2016-04-06 DIAGNOSIS — C8190 Hodgkin lymphoma, unspecified, unspecified site: Secondary | ICD-10-CM | POA: Insufficient documentation

## 2016-04-06 DIAGNOSIS — Z7982 Long term (current) use of aspirin: Secondary | ICD-10-CM

## 2016-04-06 DIAGNOSIS — C8518 Unspecified B-cell lymphoma, lymph nodes of multiple sites: Secondary | ICD-10-CM

## 2016-04-06 DIAGNOSIS — C8580 Other specified types of non-Hodgkin lymphoma, unspecified site: Principal | ICD-10-CM

## 2016-04-06 DIAGNOSIS — R918 Other nonspecific abnormal finding of lung field: Secondary | ICD-10-CM | POA: Diagnosis not present

## 2016-04-06 DIAGNOSIS — D649 Anemia, unspecified: Secondary | ICD-10-CM

## 2016-04-06 DIAGNOSIS — R634 Abnormal weight loss: Secondary | ICD-10-CM | POA: Diagnosis not present

## 2016-04-06 DIAGNOSIS — Z5112 Encounter for antineoplastic immunotherapy: Secondary | ICD-10-CM | POA: Diagnosis not present

## 2016-04-06 DIAGNOSIS — Z79899 Other long term (current) drug therapy: Secondary | ICD-10-CM

## 2016-04-06 DIAGNOSIS — Z806 Family history of leukemia: Secondary | ICD-10-CM | POA: Diagnosis not present

## 2016-04-06 DIAGNOSIS — R05 Cough: Secondary | ICD-10-CM | POA: Diagnosis not present

## 2016-04-06 DIAGNOSIS — Z833 Family history of diabetes mellitus: Secondary | ICD-10-CM | POA: Diagnosis not present

## 2016-04-06 DIAGNOSIS — Z801 Family history of malignant neoplasm of trachea, bronchus and lung: Secondary | ICD-10-CM | POA: Insufficient documentation

## 2016-04-06 DIAGNOSIS — E876 Hypokalemia: Secondary | ICD-10-CM | POA: Diagnosis not present

## 2016-04-06 DIAGNOSIS — H9193 Unspecified hearing loss, bilateral: Secondary | ICD-10-CM

## 2016-04-06 DIAGNOSIS — Z5111 Encounter for antineoplastic chemotherapy: Secondary | ICD-10-CM | POA: Diagnosis present

## 2016-04-06 HISTORY — PX: PERIPHERAL VASCULAR CATHETERIZATION: SHX172C

## 2016-04-06 LAB — CBC WITH DIFFERENTIAL/PLATELET
Basophils Absolute: 0.1 10*3/uL (ref 0–0.1)
Basophils Relative: 1 %
Eosinophils Absolute: 0.1 10*3/uL (ref 0–0.7)
Eosinophils Relative: 1 %
HCT: 26.3 % — ABNORMAL LOW (ref 35.0–47.0)
Hemoglobin: 9.2 g/dL — ABNORMAL LOW (ref 12.0–16.0)
Lymphocytes Relative: 22 %
Lymphs Abs: 2.4 10*3/uL (ref 1.0–3.6)
MCH: 30.9 pg (ref 26.0–34.0)
MCHC: 34.8 g/dL (ref 32.0–36.0)
MCV: 88.8 fL (ref 80.0–100.0)
Monocytes Absolute: 1.2 10*3/uL — ABNORMAL HIGH (ref 0.2–0.9)
Monocytes Relative: 10 %
Neutro Abs: 7.5 10*3/uL — ABNORMAL HIGH (ref 1.4–6.5)
Neutrophils Relative %: 66 %
Platelets: 640 10*3/uL — ABNORMAL HIGH (ref 150–440)
RBC: 2.97 MIL/uL — ABNORMAL LOW (ref 3.80–5.20)
RDW: 15 % — ABNORMAL HIGH (ref 11.5–14.5)
WBC: 11.3 10*3/uL — ABNORMAL HIGH (ref 3.6–11.0)

## 2016-04-06 LAB — COMPREHENSIVE METABOLIC PANEL
ALT: 38 U/L (ref 14–54)
AST: 40 U/L (ref 15–41)
Albumin: 2.8 g/dL — ABNORMAL LOW (ref 3.5–5.0)
Alkaline Phosphatase: 126 U/L (ref 38–126)
Anion gap: 9 (ref 5–15)
BUN: 19 mg/dL (ref 6–20)
CO2: 22 mmol/L (ref 22–32)
Calcium: 10.2 mg/dL (ref 8.9–10.3)
Chloride: 96 mmol/L — ABNORMAL LOW (ref 101–111)
Creatinine, Ser: 1.15 mg/dL — ABNORMAL HIGH (ref 0.44–1.00)
GFR calc Af Amer: 54 mL/min — ABNORMAL LOW (ref >60)
GFR calc non Af Amer: 46 mL/min — ABNORMAL LOW (ref >60)
Glucose, Bld: 98 mg/dL (ref 65–99)
Potassium: 3.6 mmol/L (ref 3.5–5.1)
Sodium: 127 mmol/L — ABNORMAL LOW (ref 135–145)
Total Bilirubin: 0.8 mg/dL (ref 0.3–1.2)
Total Protein: 6.6 g/dL (ref 6.5–8.1)

## 2016-04-06 LAB — URIC ACID: Uric Acid, Serum: 4.7 mg/dL (ref 2.3–6.6)

## 2016-04-06 LAB — SLIDE CONSULT, PATHOLOGY ARMC

## 2016-04-06 LAB — LACTATE DEHYDROGENASE: LDH: 122 U/L (ref 98–192)

## 2016-04-06 SURGERY — PORTA CATH INSERTION
Anesthesia: Moderate Sedation

## 2016-04-06 MED ORDER — FENTANYL CITRATE (PF) 100 MCG/2ML IJ SOLN
INTRAMUSCULAR | Status: AC
  Filled 2016-04-06: qty 2

## 2016-04-06 MED ORDER — LIDOCAINE-EPINEPHRINE (PF) 1 %-1:200000 IJ SOLN
INTRAMUSCULAR | Status: AC
  Filled 2016-04-06: qty 10

## 2016-04-06 MED ORDER — SODIUM CHLORIDE 0.9 % IR SOLN
Freq: Once | Status: DC
  Filled 2016-04-06 (×2): qty 2

## 2016-04-06 MED ORDER — ONDANSETRON HCL 4 MG/2ML IJ SOLN: 4.0000 mg | Freq: Four times a day (QID) | INTRAMUSCULAR | Status: DC | PRN

## 2016-04-06 MED ORDER — MIDAZOLAM HCL 2 MG/2ML IJ SOLN
INTRAMUSCULAR | Status: DC | PRN
  Administered 2016-04-06 (×2): 1 mg via INTRAVENOUS
  Administered 2016-04-06: 2 mg via INTRAVENOUS

## 2016-04-06 MED ORDER — ALLOPURINOL 300 MG PO TABS: 300.0000 mg | ORAL_TABLET | Freq: Every day | ORAL | 3 refills | Status: DC

## 2016-04-06 MED ORDER — LIDOCAINE-EPINEPHRINE (PF) 1 %-1:200000 IJ SOLN
INTRAMUSCULAR | Status: AC
  Filled 2016-04-06: qty 20

## 2016-04-06 MED ORDER — LIDOCAINE-PRILOCAINE 2.5-2.5 % EX CREA: TOPICAL_CREAM | CUTANEOUS | 1 refills | Status: DC

## 2016-04-06 MED ORDER — MIDAZOLAM HCL 5 MG/5ML IJ SOLN
INTRAMUSCULAR | Status: AC
  Filled 2016-04-06: qty 5

## 2016-04-06 MED ORDER — FENTANYL CITRATE (PF) 100 MCG/2ML IJ SOLN
INTRAMUSCULAR | Status: DC | PRN
  Administered 2016-04-06 (×2): 50 ug via INTRAVENOUS

## 2016-04-06 MED ORDER — ONDANSETRON HCL 8 MG PO TABS: 8.0000 mg | ORAL_TABLET | Freq: Two times a day (BID) | ORAL | 1 refills | Status: DC | PRN

## 2016-04-06 MED ORDER — HYDROMORPHONE HCL 1 MG/ML IJ SOLN: 1.0000 mg | Freq: Once | INTRAMUSCULAR | Status: DC

## 2016-04-06 MED ORDER — SODIUM CHLORIDE 0.9 % IV SOLN: INTRAVENOUS | Status: DC

## 2016-04-06 MED ORDER — HEPARIN (PORCINE) IN NACL 2-0.9 UNIT/ML-% IJ SOLN
INTRAMUSCULAR | Status: AC
Start: 2016-04-06 — End: 2016-04-06
  Filled 2016-04-06: qty 500

## 2016-04-06 MED ORDER — PREDNISONE 20 MG PO TABS: 40.0000 mg/m2 | ORAL_TABLET | Freq: Every day | ORAL | 5 refills | Status: DC

## 2016-04-06 MED ORDER — DEXTROSE 5 % IV SOLN: 1.5000 g | INTRAVENOUS | Status: DC

## 2016-04-06 SURGICAL SUPPLY — 10 items
BAG DECANTER STRL (MISCELLANEOUS) ×3 IMPLANT
KIT PORT POWER 8FR ISP CVUE (Catheter) ×3 IMPLANT
PACK ANGIOGRAPHY (CUSTOM PROCEDURE TRAY) ×3 IMPLANT
PAD GROUND ADULT SPLIT (MISCELLANEOUS) ×3 IMPLANT
PENCIL ELECTRO HAND CTR (MISCELLANEOUS) ×3 IMPLANT
PREP CHG 10.5 TEAL (MISCELLANEOUS) ×3 IMPLANT
SUT MNCRL AB 4-0 PS2 18 (SUTURE) ×3 IMPLANT
SUT PROLENE 0 CT 1 30 (SUTURE) ×3 IMPLANT
SUTURE VIC 3-0 (SUTURE) ×3 IMPLANT
TOWEL OR 17X26 4PK STRL BLUE (TOWEL DISPOSABLE) ×3 IMPLANT

## 2016-04-06 NOTE — Progress Notes (Signed)
Patient accompanied by her daughter and interpreter.  Daughter states patient got winded last night while taking a shower.  She is not eating well.  Also states she ran a temperature last night - 100.3, 101, 102.  Took ibuprofen and temp decreased to 99.  No temp this morning.

## 2016-04-06 NOTE — Telephone Encounter (Signed)
Called daughter to let her know that the pathologist agreed with dx. And she is ok for treatment in am. She was thankful and will be here tom.  Then dr Mike Gip said that na is low and to call daughter and let her know that pt needs to drink smart water, water with electrolytes, gatorade. When she eats let her put salt on it to get na level up. She was currently drinking gingerale and potato chips and that is good and she will get her gatorade.  Ebony Hail the rn in specials got on the phone and said that allopurinol got sent to wrong pharmacy and I called and [facility] [place].  Also she needs emla cream and it was sent in electronically and the [facility]an [facility] [place].

## 2016-04-06 NOTE — H&P (Signed)
.[facility] day:  04/06/2016  Chief Complaint: Toni Griffith is a 75 y.o. female with B cell lymphoma, unclassifiable, who is seen for review of work-up and discussion regarding direction of therapy.  HPI:  The patient was last seen in the medical oncology clinic on 03/30/2016.  At that time, she was seen for initial consultation.  Echo on 04/04/2016 revealed an EF of 55-60%.  Port-a-cath is to be placed today.    Past Medical History:  Diagnosis Date  . Cancer [facility])     Past Surgical History:  Procedure Laterality Date  . CARDIAC SURGERY  1993    Family History  Problem Relation Age of Onset  . Lung cancer Mother   . Lung cancer Father   . Diabetes Maternal Uncle   . Myasthenia gravis Maternal Grandmother   . Leukemia Maternal Grandfather     Social History:  reports that she has quit smoking. She has never used smokeless tobacco. She reports that she drinks alcohol. Her drug history is not on file.  She smoked 1 1/2 packs per day for 20 years (30 pack year smoking history) until 3 months ago.  She is hearing impaired (deaf).  She lives in [place] with her boyfriend.  Her daughter lives in [place]. The patient is accompanied by her daughter [name]; mobile: [phone]; home: [phone]), son [name]), boyfriend [name]), and interpreter (sign language) today.  Allergies: No Known Allergies  Current Medications: Current Outpatient Prescriptions  Medication Sig Dispense Refill  . acetaminophen (TYLENOL) 325 MG tablet Take 650 mg by mouth.    Marland Kitchen aspirin (GOODSENSE ASPIRIN) 325 MG tablet Take 325 mg by mouth.    Marland Kitchen atorvastatin (LIPITOR) 80 MG tablet Take 80 mg by mouth.    . B Complex Vitamins (B COMPLEX 1 PO) Take by mouth.    Marland Kitchen FLUoxetine (PROZAC) 40 MG capsule Take 40 mg by mouth.    . Garlic 2694 MG CAPS Take by mouth.    . naproxen sodium (ANAPROX) 220 MG tablet Take 220 mg by mouth as needed.    . pantoprazole  (PROTONIX) 40 MG tablet Take 40 mg by mouth.    . triamterene-hydrochlorothiazide (DYAZIDE) 37.5-25 MG capsule Take by mouth.    . vitamin B-12 (CYANOCOBALAMIN) 500 MCG tablet Take 500 mcg by mouth daily.    . vitamin E 400 UNIT capsule Take by mouth.     No current facility-administered medications for this visit.    Facility-Administered Medications Ordered in Other Visits  Medication Dose Route Frequency Provider Last Rate Last Dose  . cefUROXime (ZINACEF) 1.5 g in dextrose 5 % 50 mL IVPB  1.5 g Intravenous 30 min Pre-Op Sela Hua, PA-C        Review of Systems:  GENERAL:  Fatigue.  Minimally active.  No fevers or sweats.  Weight loss of 52 pounds (25-30 pound since 09/2015). PERFORMANCE STATUS (ECOG):  2-3 HEENT:  No visual changes, runny nose, sore throat, mouth sores or tenderness. Lungs: No shortness of breath or cough.  No hemoptysis. Cardiac:  No chest pain, palpitations, orthopnea, or PND. GI:  Nausea and vomiting, resolved.  No diarrhea, constipation, melena or hematochezia. GU:  No urgency, frequency, dysuria, or hematuria. Musculoskeletal:  Bilateral leg pain (left > right).  Pain in knees and feet.  No back pain.  No muscle tenderness. Extremities:  No pain or swelling. Skin:  No rashes or skin changes. Neuro:  No headache, numbness or  weakness, balance or coordination issues. Endocrine:  No diabetes, thyroid issues, hot flashes or night sweats. Psych:  No mood changes, depression or anxiety. Pain:  No focal pain. Review of systems:  All other systems reviewed and found to be negative.  Physical Exam: Blood pressure 133/79, pulse 77, temperature (!) 95.3 F (35.2 C), temperature source Tympanic, resp. rate 18, weight 143 lb 15.4 oz (65.3 kg). GENERAL:  Elderly sitting comfortably in a wheelchair in the exam room in no acute distress.  Slow gait.  She needs assistance onto the table. MENTAL STATUS:  Alert and oriented to person, place and time. HEAD: Curly  gray hair.  Normocephalic, atraumatic, face symmetric, no Cushingoid features. EYES:  Hazel eyes.  Pupils equal round and reactive to light and accomodation.  No conjunctivitis or scleral icterus. ENT:  Oropharynx clear without lesion.  Upper dentures.  Tongue normal. Mucous membranes moist.  RESPIRATORY:  Clear to auscultation without rales, wheezes or rhonchi. CARDIOVASCULAR:  Regular rate and rhythm without murmur, rub or gallop. ABDOMEN:  Soft, non-tender, with active bowel sounds, and no appreciable hepatosplenomegaly.  No masses. SKIN:  No rashes, ulcers or lesions. EXTREMITIES: No edema, no skin discoloration or tenderness.  No palpable cords. LYMPH NODES: Small < 1 cm axillary nodes.  Shotty inguinal adenopathy.  No palpable cervical, supraclavicular adenopathy  NEUROLOGICAL: Alert & oriented, cranial nerves II-XII intact; motor strength 5/5 throughout; sensation intact; finger to nose and RAM normal; slow gait. PSYCH:  Appropriate.   No visits with results within 3 Day(s) from this visit.  Latest known visit with results is:  Appointment on 03/30/2016  Component Date Value Ref Range Status  . WBC 03/30/2016 12.2* 3.6 - 11.0 K/uL Final  . RBC 03/30/2016 3.18* 3.80 - 5.20 MIL/uL Final  . Hemoglobin 03/30/2016 9.8* 12.0 - 16.0 g/dL Final  . HCT 03/30/2016 28.4* 35.0 - 47.0 % Final  . MCV 03/30/2016 89.3  80.0 - 100.0 fL Final  . MCH 03/30/2016 30.8  26.0 - 34.0 pg Final  . MCHC 03/30/2016 34.4  32.0 - 36.0 g/dL Final  . RDW 03/30/2016 14.6* 11.5 - 14.5 % Final  . Platelets 03/30/2016 666* 150 - 440 K/uL Final  . Neutrophils Relative % 03/30/2016 70  % Final  . Neutro Abs 03/30/2016 8.7* 1.4 - 6.5 K/uL Final  . Lymphocytes Relative 03/30/2016 19  % Final  . Lymphs Abs 03/30/2016 2.3  1.0 - 3.6 K/uL Final  . Monocytes Relative 03/30/2016 10  % Final  . Monocytes Absolute 03/30/2016 1.2* 0.2 - 0.9 K/uL Final  . Eosinophils Relative 03/30/2016 0  % Final  . Eosinophils Absolute  03/30/2016 0.0  0 - 0.7 K/uL Final  . Basophils Relative 03/30/2016 1  % Final  . Basophils Absolute 03/30/2016 0.1  0 - 0.1 K/uL Final  . Sodium 03/30/2016 131* 135 - 145 mmol/L Final  . Potassium 03/30/2016 3.4* 3.5 - 5.1 mmol/L Final  . Chloride 03/30/2016 97* 101 - 111 mmol/L Final  . CO2 03/30/2016 22  22 - 32 mmol/L Final  . Glucose, Bld 03/30/2016 105* 65 - 99 mg/dL Final  . BUN 03/30/2016 28* 6 - 20 mg/dL Final  . Creatinine, Ser 03/30/2016 1.20* 0.44 - 1.00 mg/dL Final  . Calcium 03/30/2016 10.6* 8.9 - 10.3 mg/dL Final  . Total Protein 03/30/2016 6.8  6.5 - 8.1 g/dL Final  . Albumin 03/30/2016 3.0* 3.5 - 5.0 g/dL Final  . AST 03/30/2016 38  15 - 41 U/L Final  .  ALT 03/30/2016 36  14 - 54 U/L Final  . Alkaline Phosphatase 03/30/2016 116  38 - 126 U/L Final  . Total Bilirubin 03/30/2016 0.7  0.3 - 1.2 mg/dL Final  . GFR calc non Af Amer 03/30/2016 44* >60 mL/min Final  . GFR calc Af Amer 03/30/2016 51* >60 mL/min Final   Comment: (NOTE) The eGFR has been calculated using the CKD EPI equation. This calculation has not been validated in all clinical situations. eGFR's persistently <60 mL/min signify possible Chronic Kidney Disease.   . Anion gap 03/30/2016 12  5 - 15 Final  . LDH 03/30/2016 122  98 - 192 U/L Final  . Uric Acid, Serum 03/30/2016 4.5  2.3 - 6.6 mg/dL Final  . Hemoglobin 03/31/2016 9.8* 11.1 - 15.9 g/dL Final  . G6PDH 03/31/2016 10.4  4.6 - 13.5 U/g Hb Final   Comment: (NOTE) When decreased, G-6-PD, Quant. values are associated with acute hemolytic anemia when deficient individuals are exposed to oxidative stress, such as with certain medications (e.g., primaquine), infection, or ingestion of fava beans. Caution: In patients with acute hemolysis (e.g., abnormally low RBC values), testing for G-6-PD may be falsely normal because older erythrocytes with a higher enzyme deficiency have been hemolyzed. Young erythrocytes and reticulocytes have normal or  near-normal enzyme activity. Normal values of G-6-PD may be measured for several weeks following a hemolytic event. Performed At: [facility] [place], [place] [id number] Lindon Romp MD IO:[id number]   . Hepatitis B Surface Ag 03/31/2016 Negative  Negative Final   Comment: (NOTE) Performed At: [facility] [place], [place] [id number] Lindon Romp MD [id number]   . Hep B Core Total Ab 03/31/2016 Negative  Negative Final   Comment: (NOTE) Performed At: [facility] [place], [place] [id number] Lindon Romp MD [id number]   . HCV Ab 03/31/2016 <0.1  0.0 - 0.9 s/co ratio Final   Comment: (NOTE)                                  Negative:     < 0.8                             Indeterminate: 0.8 - 0.9                                  Positive:     > 0.9 The CDC recommends that a positive HCV antibody result be followed up with a HCV Nucleic Acid Amplification test (349179). Performed At: [facility] [place], [place] [id number] Lindon Romp MD [id number]   . aPTT 03/30/2016 36  24 - 36 seconds Final  . Prothrombin Time 03/30/2016 13.3  11.4 - 15.2 seconds Final  . INR 03/30/2016 1.01   Final    Assessment:  Toni Griffith is a 75 y.o. female with stage IVB B-cell lymphoma, unclassifiable, with features intermediate between diffuse large B-cell lymphoma and classical Hodgkin's lymphoma ("gray zone" lymphoma).  She underwent right iliac wing bone/bone marrow biopsy on 03/08/2016.  Chest, abdomen and pelvic CT scan without contrast on 02/20/2016 revealed a 3.7 cm subpleural mass within the left lower lobe, a 1.1 cm right lower lobe subpleural nodule , 9 mm RUL nodule, and 0.4 cm lingular nodule.  Between the left second, third, and fourth ribs there was 1.9 cm soft tissue mass. There was a 2.5 cm low-attenuation nodule in the left lobe of the thyroid.   There were prominent  left axillary nodes (up to 1.0 cm).   There was no definite mediastinal adenopathy. There was a 5.5 x 4.6 cm cystic lesion in the left hepatic lobe.  There was a 3.1 cm nodule in the right adrenal gland. There was a 1.2 cm right retrocaval lymph node with additional prominent subcentimeter retroperitoneal nodes. There was 1.0 cm left common iliac node. There was a 6.2 x 5.2 cm soft tissue mass within the right ilium.  Head MRI on 02/21/2016 revealed no evidence of malignancy. 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   There was activity in the superior left ocular orbit.  There was a large lesion centered in the right iliac wing with soft tissue extension.  There were hypermetabolic right external iliac lymph nodes  She was admitted to [facility].  She had renal failure and hypercalcemia. She was treated with IVF, calcitonin and Zometa.The patient has a history of coronary artery disease s/p CABG in 1993.  She has no history of heart failure.  Symptomatically, she has lost 52 pounds (25 - 30 pounds since 09/2015).  She has had a general decline in health.  Plan: 1.  Review diagnosis, staging, and management of lymphoma.  Discuss unusual pathology.  Slides have been requested for review and confirmation of diagnosis.  Based on bone and bone marrow disease and symptoms (weight loss), she has stage IVB disease.  Discuss treatment with RCHOP or EPOCH.  Given patient's age and performance status, discuss consideration of mini-RCHOP with dose advancement as tolerated.  Discuss possible LP to assess for CNS involvement (given diffuse large B cell lymphoma with marrow involvement) and consideration of CNS prophylaxis (MTX).    Discuss  reviewing outside imaging.  Discuss RCHOP/mini-RCHOP.  Discuss chemotherapy every 3 weeks with Neulasta support.  Side effects of chemotherapy reviewed in detail.  Schedule chemotherapy teaching.  Discuss port-a-cath placement.  Discuss issues with tumor lysis syndrome.  Discuss importance of hydration and allopurinol.  Discuss management of nausea.  2.  Labs today:  CBC with diff, CMP, LDH, uric acid, G6PD assay, hepatitis B core antibody total, hepatitis B surface antigen, hepatitis C antibody, PT, PTT. 3.  Schedule echo:  assess EF prior to chemo. 4.  Schedule port-a-cath placement 5.  Chemo class- mini RCHOP 6.  Pathology request from [facility]. 7.  RTC in 1 week for MD assess, review of testing, and RCHOP the next day.   Lequita Asal, MD  04/06/2016, 9:33 AM

## 2016-04-06 NOTE — Op Note (Signed)
      [facility]       Operative Note  Date: 04/06/2016  Preoperative diagnosis:  1. Hodgkin's Lymphoma  Postoperative diagnosis:  Same as above  Procedures: #1. Ultrasound guidance for vascular access to the right internal jugular vein. #2. Fluoroscopic guidance for placement of catheter. #3. Placement of CT compatible Port-A-Cath, right internal jugular vein.  Surgeon: Leotis Pain, MD.   Anesthesia: Local with moderate conscious sedation for approximately 20  minutes using 4 mg of Versed and 100 mcg of Fentanyl  Fluoroscopy time: less than 1 minute  Contrast used: 0  Estimated blood loss: minimal  Indication for the procedure:  The patient is a 75 y.o.female with Hodgkin's lymphoma.  The patient needs a Port-A-Cath for durable venous access, chemotherapy, lab draws, and CT scans. We are asked to place this. Risks and benefits were discussed and informed consent was obtained.  Description of procedure: The patient was brought to the vascular and interventional radiology suite.  Moderate conscious sedation was administered throughout the procedure during a face to face encounter with the patient with my supervision of the RN administering medicines and monitoring the patient's vital signs, pulse oximetry, telemetry and mental status throughout from the start of the procedure until the patient was taken to the recovery room. The right neck chest and shoulder were sterilely prepped and draped, and a sterile surgical field was created. Ultrasound was used to help visualize a patent right internal jugular vein. This was then accessed under direct ultrasound guidance without difficulty with the Seldinger needle and a permanent image was recorded. A J-wire was placed. After skin nick and dilatation, the peel-away sheath was then placed over the wire. I then anesthetized an area under the clavicle approximately 1-2 fingerbreadths. A transverse incision was created and an  inferior pocket was created with electrocautery and blunt dissection. The port was then brought onto the field, placed into the pocket and secured to the chest wall with 2 Prolene sutures. The catheter was connected to the port and tunneled from the subclavicular incision to the access site. Fluoroscopic guidance was then used to cut the catheter to an appropriate length. The catheter was then placed through the peel-away sheath and the peel-away sheath was removed. The catheter tip was parked in excellent location under fluorocoscopic guidance in the cavoatrial junction area. The pocket was then irrigated with antibiotic impregnated saline and the wound was closed with a running 3-0 Vicryl and a 4-0 Monocryl. The access incision was closed with a single 4-0 Monocryl. The Huber needle was used to withdraw blood and flush the port with heparinized saline. Dermabond was then placed as a dressing. The patient tolerated the procedure well and was taken to the recovery room in stable condition.   DEW,JASON 04/06/2016 1:14 PM

## 2016-04-06 NOTE — H&P (Signed)
  [facility] History & Physical Update  The patient was interviewed and re-examined.  The patient's previous History and Physical has been reviewed and is unchanged.  There is no change in the plan of care. We plan to proceed with the scheduled procedure.  DEW,JASON, MD  04/06/2016, 12:03 PM

## 2016-04-06 NOTE — Discharge Instructions (Signed)

## 2016-04-07 ENCOUNTER — Encounter: Payer: Self-pay | Admitting: Vascular Surgery

## 2016-04-07 ENCOUNTER — Other Ambulatory Visit: Payer: Self-pay | Admitting: Hematology and Oncology

## 2016-04-07 ENCOUNTER — Inpatient Hospital Stay: Payer: Medicare HMO

## 2016-04-07 VITALS — BP 115/68 | HR 85 | Temp 96.6°F | Resp 18

## 2016-04-07 DIAGNOSIS — C8580 Other specified types of non-Hodgkin lymphoma, unspecified site: Principal | ICD-10-CM

## 2016-04-07 DIAGNOSIS — Z5111 Encounter for antineoplastic chemotherapy: Secondary | ICD-10-CM | POA: Diagnosis not present

## 2016-04-07 DIAGNOSIS — C8330 Diffuse large B-cell lymphoma, unspecified site: Secondary | ICD-10-CM

## 2016-04-07 MED ORDER — SODIUM CHLORIDE 0.9 % IV SOLN
Freq: Once | INTRAVENOUS | Status: AC
  Administered 2016-04-07: 10:00:00 via INTRAVENOUS
  Filled 2016-04-07: qty 1000

## 2016-04-07 MED ORDER — HEPARIN SOD (PORK) LOCK FLUSH 100 UNIT/ML IV SOLN
500.0000 [IU] | Freq: Once | INTRAVENOUS | Status: AC | PRN
  Administered 2016-04-07: 500 [IU]
  Filled 2016-04-07: qty 5

## 2016-04-07 MED ORDER — VINCRISTINE SULFATE CHEMO INJECTION 1 MG/ML
1.0000 mg | Freq: Once | INTRAVENOUS | Status: AC
  Administered 2016-04-07: 1 mg via INTRAVENOUS
  Filled 2016-04-07: qty 1

## 2016-04-07 MED ORDER — SODIUM CHLORIDE 0.9% FLUSH
10.0000 mL | INTRAVENOUS | Status: DC | PRN
  Administered 2016-04-07: 10 mL
  Filled 2016-04-07: qty 10

## 2016-04-07 MED ORDER — SODIUM CHLORIDE 0.9 % IV SOLN
375.0000 mg/m2 | Freq: Once | INTRAVENOUS | Status: AC
  Administered 2016-04-07: 600 mg via INTRAVENOUS
  Filled 2016-04-07: qty 50

## 2016-04-07 MED ORDER — DOXORUBICIN HCL CHEMO IV INJECTION 2 MG/ML
25.0000 mg/m2 | Freq: Once | INTRAVENOUS | Status: AC
Start: 2016-04-07 — End: 2016-04-07
  Administered 2016-04-07: 42 mg via INTRAVENOUS
  Filled 2016-04-07: qty 21

## 2016-04-07 MED ORDER — DIPHENHYDRAMINE HCL 25 MG PO CAPS
50.0000 mg | ORAL_CAPSULE | Freq: Once | ORAL | Status: AC
  Administered 2016-04-07: 50 mg via ORAL
  Filled 2016-04-07: qty 2

## 2016-04-07 MED ORDER — SODIUM CHLORIDE 0.9 % IV SOLN
400.0000 mg/m2 | Freq: Once | INTRAVENOUS | Status: AC
  Administered 2016-04-07: 680 mg via INTRAVENOUS
  Filled 2016-04-07: qty 34

## 2016-04-07 MED ORDER — ACETAMINOPHEN 325 MG PO TABS
650.0000 mg | ORAL_TABLET | Freq: Once | ORAL | Status: AC
  Administered 2016-04-07: 650 mg via ORAL
  Filled 2016-04-07: qty 2

## 2016-04-07 MED ORDER — PROMETHAZINE HCL 12.5 MG PO TABS: 12.5000 mg | ORAL_TABLET | Freq: Four times a day (QID) | ORAL | 0 refills | Status: DC | PRN

## 2016-04-07 MED ORDER — SODIUM CHLORIDE 0.9 % IV SOLN
10.0000 mg | Freq: Once | INTRAVENOUS | Status: AC
  Administered 2016-04-07: 10 mg via INTRAVENOUS
  Filled 2016-04-07: qty 1

## 2016-04-07 MED ORDER — PALONOSETRON HCL INJECTION 0.25 MG/5ML
0.2500 mg | Freq: Once | INTRAVENOUS | Status: AC
  Administered 2016-04-07: 0.25 mg via INTRAVENOUS
  Filled 2016-04-07: qty 5

## 2016-04-10 ENCOUNTER — Inpatient Hospital Stay: Payer: Medicare HMO

## 2016-04-10 ENCOUNTER — Other Ambulatory Visit: Payer: Self-pay

## 2016-04-10 ENCOUNTER — Other Ambulatory Visit: Payer: Self-pay | Admitting: *Deleted

## 2016-04-10 DIAGNOSIS — E876 Hypokalemia: Secondary | ICD-10-CM

## 2016-04-10 DIAGNOSIS — C8330 Diffuse large B-cell lymphoma, unspecified site: Secondary | ICD-10-CM

## 2016-04-10 DIAGNOSIS — Z5111 Encounter for antineoplastic chemotherapy: Secondary | ICD-10-CM | POA: Diagnosis not present

## 2016-04-10 DIAGNOSIS — C8580 Other specified types of non-Hodgkin lymphoma, unspecified site: Principal | ICD-10-CM

## 2016-04-10 LAB — CBC WITH DIFFERENTIAL/PLATELET
Basophils Absolute: 0 10*3/uL (ref 0–0.1)
Basophils Relative: 0 %
Eosinophils Absolute: 0 10*3/uL (ref 0–0.7)
Eosinophils Relative: 0 %
HCT: 26 % — ABNORMAL LOW (ref 35.0–47.0)
Hemoglobin: 9 g/dL — ABNORMAL LOW (ref 12.0–16.0)
Lymphocytes Relative: 18 %
Lymphs Abs: 2 10*3/uL (ref 1.0–3.6)
MCH: 30.6 pg (ref 26.0–34.0)
MCHC: 34.5 g/dL (ref 32.0–36.0)
MCV: 88.6 fL (ref 80.0–100.0)
Monocytes Absolute: 0.9 10*3/uL (ref 0.2–0.9)
Monocytes Relative: 8 %
Neutro Abs: 8.6 10*3/uL — ABNORMAL HIGH (ref 1.4–6.5)
Neutrophils Relative %: 74 %
Platelets: 575 10*3/uL — ABNORMAL HIGH (ref 150–440)
RBC: 2.93 MIL/uL — ABNORMAL LOW (ref 3.80–5.20)
RDW: 14.8 % — ABNORMAL HIGH (ref 11.5–14.5)
WBC: 11.6 10*3/uL — ABNORMAL HIGH (ref 3.6–11.0)

## 2016-04-10 LAB — COMPREHENSIVE METABOLIC PANEL
ALT: 49 U/L (ref 14–54)
AST: 44 U/L — ABNORMAL HIGH (ref 15–41)
Albumin: 2.8 g/dL — ABNORMAL LOW (ref 3.5–5.0)
Alkaline Phosphatase: 113 U/L (ref 38–126)
Anion gap: 6 (ref 5–15)
BUN: 33 mg/dL — ABNORMAL HIGH (ref 6–20)
CO2: 23 mmol/L (ref 22–32)
Calcium: 9.7 mg/dL (ref 8.9–10.3)
Chloride: 101 mmol/L (ref 101–111)
Creatinine, Ser: 1.02 mg/dL — ABNORMAL HIGH (ref 0.44–1.00)
GFR calc Af Amer: >60 mL/min (ref >60)
GFR calc non Af Amer: 54 mL/min — ABNORMAL LOW (ref >60)
Glucose, Bld: 110 mg/dL — ABNORMAL HIGH (ref 65–99)
Potassium: 3.2 mmol/L — ABNORMAL LOW (ref 3.5–5.1)
Sodium: 130 mmol/L — ABNORMAL LOW (ref 135–145)
Total Bilirubin: 0.7 mg/dL (ref 0.3–1.2)
Total Protein: 6.2 g/dL — ABNORMAL LOW (ref 6.5–8.1)

## 2016-04-10 LAB — URIC ACID: Uric Acid, Serum: 3.3 mg/dL (ref 2.3–6.6)

## 2016-04-10 MED ORDER — HEPARIN SOD (PORK) LOCK FLUSH 100 UNIT/ML IV SOLN: 500.0000 [IU] | Freq: Once | INTRAVENOUS | Status: DC | PRN

## 2016-04-10 MED ORDER — SODIUM CHLORIDE 0.9 % IV SOLN
Freq: Once | INTRAVENOUS | Status: DC
Start: 2016-04-10 — End: 2016-04-10

## 2016-04-10 MED ORDER — ZOLEDRONIC ACID 4 MG/100ML IV SOLN: 4.0000 mg | Freq: Once | INTRAVENOUS | Status: DC

## 2016-04-10 MED ORDER — POTASSIUM CHLORIDE ER 10 MEQ PO TBCR: 10.0000 meq | EXTENDED_RELEASE_TABLET | Freq: Every day | ORAL | 0 refills | Status: DC

## 2016-04-10 MED ORDER — SODIUM CHLORIDE 0.9% FLUSH
10.0000 mL | INTRAVENOUS | Status: DC | PRN
  Filled 2016-04-10: qty 10

## 2016-04-10 NOTE — Progress Notes (Signed)
Ca 9.7. No Zometa needed per Dr Mike Gip

## 2016-04-10 NOTE — Progress Notes (Signed)
Pt came over today for labs and poss. zometa but her labs did not indicate the need for zometa today, but her Potassium low and she and her son was told using the electronic sign language to let her know that she will get potassium 10 meq tablets 1 a day for 4 days and she will be given extra incase the potassium is an issue in the future that we will direct her for the future and son and pt understands directions

## 2016-04-11 LAB — PATHOLOGY

## 2016-04-13 DIAGNOSIS — C801 Malignant (primary) neoplasm, unspecified: Secondary | ICD-10-CM | POA: Insufficient documentation

## 2016-04-13 DIAGNOSIS — G63 Polyneuropathy in diseases classified elsewhere: Secondary | ICD-10-CM

## 2016-04-14 ENCOUNTER — Other Ambulatory Visit: Payer: Self-pay | Admitting: *Deleted

## 2016-04-14 DIAGNOSIS — C8330 Diffuse large B-cell lymphoma, unspecified site: Secondary | ICD-10-CM

## 2016-04-14 DIAGNOSIS — C8580 Other specified types of non-Hodgkin lymphoma, unspecified site: Principal | ICD-10-CM

## 2016-04-16 NOTE — Progress Notes (Signed)
.[facility] day:  04/06/2016  Chief Complaint: T[facility] is a 75 y.o. female with B cell lymphoma, unclassifiable.  HPI:  Patient returns to [facility] today for repeat laboratory work, further evaluation, and to assess her toleration of cycle 1 of treatment. Patient states she tolerated her treatment well without significant side effects. She does admit to some mouth sores, which are improving and nearly resolved. She has no neurologic complaints. She denies any fevers. She denies any chest pain or shortness of breath. She denies any nausea, vomiting, constipation, or diarrhea. She has no urinary complaint. Patient offers no further specific complaints today.     Past Medical History:  Diagnosis Date  . Cancer [facility])     Past Surgical History:  Procedure Laterality Date  . CARDIAC SURGERY  1993  . PERIPHERAL VASCULAR CATHETERIZATION N/A 04/06/2016   Procedure: Glori Luis Cath Insertion;  Surgeon: Algernon Huxley, MD;  Location: [facility];  Service: Cardiovascular;  Laterality: N/A;    Family History  Problem Relation Age of Onset  . Lung cancer Mother   . Lung cancer Father   . Diabetes Maternal Uncle   . Myasthenia gravis Maternal Grandmother   . Leukemia Maternal Grandfather     Social History:  reports that she has quit smoking. She has never used smokeless tobacco. She reports that she drinks alcohol. Her drug history is not on file.  She smoked 1 1/2 packs per day for 20 years (30 pack year smoking history) until 3 months ago.  She is hearing impaired (deaf).  She lives in [place] with her boyfriend.  Her daughter lives in [place]. The patient is accompanied by her daughter [name]; mobile: [phone]; home: [phone]), son [name]), boyfriend [name]), and interpreter (sign language) today.  Allergies: No Known Allergies  Current Medications: Current Outpatient Prescriptions  Medication Sig Dispense Refill  .  acetaminophen (TYLENOL) 325 MG tablet Take 650 mg by mouth.    Marland Kitchen allopurinol (ZYLOPRIM) 300 MG tablet Take 1 tablet (300 mg total) by mouth daily. 30 tablet 3  . aspirin (GOODSENSE ASPIRIN) 325 MG tablet Take 325 mg by mouth.    Marland Kitchen atorvastatin (LIPITOR) 80 MG tablet Take 80 mg by mouth.    . B Complex Vitamins (B COMPLEX 1 PO) Take by mouth.    Marland Kitchen FLUoxetine (PROZAC) 40 MG capsule Take 40 mg by mouth.    . Garlic 0630 MG CAPS Take by mouth.    . lidocaine-prilocaine (EMLA) cream Apply cream 1 hour before chemotherapy treatment and place small peive of saran wrap over cream to protect clothing 30 g 1  . naproxen sodium (ANAPROX) 220 MG tablet Take 220 mg by mouth as needed.    . ondansetron (ZOFRAN) 8 MG tablet Take 1 tablet (8 mg total) by mouth 2 (two) times daily as needed for refractory nausea / vomiting. Start on day 3 after cyclophosphamide chemotherapy. 30 tablet 1  . pantoprazole (PROTONIX) 40 MG tablet Take 40 mg by mouth.    . potassium chloride (K-DUR) 10 MEQ tablet Take 1 tablet (10 mEq total) by mouth daily. Take 1 tablet daily for 4 days and then take as directed per [name] 20 tablet 0  . predniSONE (DELTASONE) 20 MG tablet Take 3.5 tablets (70 mg total) by mouth daily with breakfast. Take on days 1-5 of chemotherapy. 20 tablet 5  . promethazine (PHENERGAN) 12.5 MG tablet Take 1 tablet (12.5 mg total) by mouth  every 6 (six) hours as needed for nausea or vomiting. 20 tablet 0  . triamterene-hydrochlorothiazide (DYAZIDE) 37.5-25 MG capsule Take by mouth.    . vitamin B-12 (CYANOCOBALAMIN) 500 MCG tablet Take 500 mcg by mouth daily.    . vitamin E 400 UNIT capsule Take by mouth.     No current facility-administered medications for this visit.     Review of Systems:  GENERAL:  Fatigue.  Minimally active.   PERFORMANCE STATUS (ECOG):  2 HEENT:  Deaf.  No visual changes, runny nose, sore throat, mouth sores or tenderness. Lungs: No shortness of breath.  Little cough.  No  hemoptysis. Cardiac:  No chest pain, palpitations, orthopnea, or PND. GI:  No nausea, vomiting, diarrhea, constipation, melena or hematochezia. GU:  No urgency, frequency, dysuria, or hematuria. Musculoskeletal:  Bilateral leg pain (left > right).  Pain in knees and feet.  No back pain.  No muscle tenderness. Extremities:  No pain or swelling. Skin:  No rashes or skin changes. Neuro:  No headache, numbness or weakness, balance or coordination issues. Endocrine:  No diabetes, thyroid issues, hot flashes or night sweats. Psych:  No mood changes, depression or anxiety. Pain:  No focal pain.  Review of systems:  All other systems reviewed and found to be negative.  Physical Exam: Blood pressure (!) 145/82, pulse (!) 103, temperature 97.4 F (36.3 C), temperature source Tympanic, resp. rate 18, weight 143 lb 15.4 oz (65.3 kg).   GENERAL:  Elderly sitting comfortably in a wheelchair in the exam room in no acute distress.  Slow gait.  MENTAL STATUS:  Alert and oriented to person, place and time. HEAD: short curly gray hair.  Normocephalic, atraumatic, face symmetric, no Cushingoid features. EYES:  Hazel eyes.  Pupils equal round and reactive to light and accomodation.  No conjunctivitis or scleral icterus. ENT:  Oropharynx clear without lesion.  Upper dentures.  Tongue normal. Mucous membranes moist.  RESPIRATORY:  Clear to auscultation without rales, wheezes or rhonchi. CARDIOVASCULAR:  Regular rate and rhythm without murmur, rub or gallop. ABDOMEN:  Soft, non-tender, with active bowel sounds, and no appreciable hepatosplenomegaly.  No masses. SKIN:  No rashes, ulcers or lesions. EXTREMITIES: No edema, no skin discoloration or tenderness.  No palpable cords. LYMPH NODES: Small < 1 cm axillary nodes.  Shotty inguinal adenopathy.  No palpable cervical, supraclavicular adenopathy  NEUROLOGICAL:  Appropriate. PSYCH:  Appropriate.   Orders Only on 04/17/2016  Component Date Value Ref Range  Status  . WBC 04/17/2016 4.1  3.6 - 11.0 K/uL Final  . RBC 04/17/2016 2.67* 3.80 - 5.20 MIL/uL Final  . Hemoglobin 04/17/2016 8.2* 12.0 - 16.0 g/dL Final  . HCT 04/17/2016 23.9* 35.0 - 47.0 % Final  . MCV 04/17/2016 89.3  80.0 - 100.0 fL Final  . MCH 04/17/2016 30.7  26.0 - 34.0 pg Final  . MCHC 04/17/2016 34.4  32.0 - 36.0 g/dL Final  . RDW 04/17/2016 14.9* 11.5 - 14.5 % Final  . Platelets 04/17/2016 265  150 - 440 K/uL Final  . Neutrophils Relative % 04/17/2016 61  % Final  . Neutro Abs 04/17/2016 2.5  1.4 - 6.5 K/uL Final  . Lymphocytes Relative 04/17/2016 28  % Final  . Lymphs Abs 04/17/2016 1.1  1.0 - 3.6 K/uL Final  . Monocytes Relative 04/17/2016 7  % Final  . Monocytes Absolute 04/17/2016 0.3  0.2 - 0.9 K/uL Final  . Eosinophils Relative 04/17/2016 2  % Final  . Eosinophils Absolute 04/17/2016 0.1  0 - 0.7 K/uL Final  . Basophils Relative 04/17/2016 2  % Final  . Basophils Absolute 04/17/2016 0.1  0 - 0.1 K/uL Final  . Ferritin 04/17/2016 444* 11 - 307 ng/mL Final  . Iron 04/17/2016 27* 28 - 170 ug/dL Final  . TIBC 04/17/2016 240* 250 - 450 ug/dL Final  . Saturation Ratios 04/17/2016 11  10.4 - 31.8 % Final  . UIBC 04/17/2016 213  ug/dL Final  . Folate 04/17/2016 10.8  >5.9 ng/mL Final  . Sodium 04/17/2016 133* 135 - 145 mmol/L Final  . Potassium 04/17/2016 3.3* 3.5 - 5.1 mmol/L Final  . Chloride 04/17/2016 102  101 - 111 mmol/L Final  . CO2 04/17/2016 23  22 - 32 mmol/L Final  . Glucose, Bld 04/17/2016 132* 65 - 99 mg/dL Final  . BUN 04/17/2016 21* 6 - 20 mg/dL Final  . Creatinine, Ser 04/17/2016 1.05* 0.44 - 1.00 mg/dL Final  . Calcium 04/17/2016 7.9* 8.9 - 10.3 mg/dL Final  . GFR calc non Af Amer 04/17/2016 52* >60 mL/min Final  . GFR calc Af Amer 04/17/2016 60* >60 mL/min Final   Comment: (NOTE) The eGFR has been calculated using the CKD EPI equation. This calculation has not been validated in all clinical situations. eGFR's persistently <60 mL/min signify possible  Chronic Kidney Disease.   . Anion gap 04/17/2016 8  5 - 15 Final  . Albumin 04/17/2016 2.9* 3.5 - 5.0 g/dL Final    Assessment:  Camilah Spillman is a 75 y.o. female with stage IVB B-cell lymphoma, unclassifiable, with features intermediate between diffuse large B-cell lymphoma and classical Hodgkin's lymphoma ("gray zone" lymphoma).  She underwent right iliac wing bone/bone marrow biopsy on 03/08/2016.  Chest, abdomen and pelvic CT scan without contrast on 02/20/2016 revealed a 3.7 cm subpleural mass within the left lower lobe, a 1.1 cm right lower lobe subpleural nodule , 9 mm RUL nodule, and 0.4 cm lingular nodule.  Between the left second, third, and fourth ribs there was 1.9 cm soft tissue mass. There was a 2.5 cm low-attenuation nodule in the left lobe of the thyroid.   There were prominent left axillary nodes (up to 1.0 cm).   There was no definite mediastinal adenopathy. There was a 5.5 x 4.6 cm cystic lesion in the left hepatic lobe.  There was a 3.1 cm nodule in the right adrenal gland. There was a 1.2 cm right retrocaval lymph node with additional prominent subcentimeter retroperitoneal nodes. There was 1.0 cm left common iliac node. There was a 6.2 x 5.2 cm soft tissue mass within the right ilium.  Head MRI on 02/21/2016 revealed no evidence of malignancy. 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   There was activity in the superior left ocular orbit.  There was a large lesion centered in the right iliac wing with soft tissue extension.  There were hypermetabolic right external iliac lymph nodes  She was [facility] to [facility].  She had renal failure and hypercalcemia. She was treated with IVF, calcitonin and  Zometa.The patient has a history of coronary artery disease s/p CABG in 1993.  She has no history of heart failure.  She is deaf and requires an interpreter.  Echo on 04/04/2016 revealed an EF of  55-60%.  Hepatitis B and C testing was negative on 03/30/2016.   G6PD assay was normal.    Plan:  1. Lymphoma: Patient tolerated her treatment well without significant side effects. Return to [facility] on April 28, 2016 for consideration of cycle 2. Plan is to give mini R-CHOP every 3 weeks with advancement of dose as tolerated. Patient will also continue 5 days of steroids with each cycle. Decision for lumbar puncture and intrathecal methotrexate has been postponed given her multiple comorbidities. 2. Hypokalemia: Patient was given dietary recommendations. 3. Hypocalcemia: Patient's calcium level corrects nearly to normal given her low albumin. 3. Anemia: Patient's hemoglobin has decreased 8.2, monitor. She may benefit from a blood transfusion and near future.   Lloyd Huger, MD  04/06/2016

## 2016-04-16 NOTE — Progress Notes (Addendum)
.[facility] day:  04/06/2016  Chief Complaint: Toni Griffith is a 75 y.o. female with B cell lymphoma, unclassifiable, who is seen prior to cycle #1 mini-RCHOP.  HPI:  The patient was last seen in the medical oncology clinic on 03/30/2016.  At that time, she was seen for initial assessment.  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  He had extensive disease in lymph nodes, bone, bone marrow, and lung.  We discussed chemotherapy options.  Decision was made to proceed with mini-CHOP and advance dose as tolerated.    She underwent laboratory testing.  CBC included a hematocrit 28.4, hemoglobin 9.8, platelets 666,000, white count 12,200 with an Donora of 8700.  Comprehensive metabolic panel included a serum of 3.4, BUN 28, creatinine 1.2, calcium 10.6 and albumin 3.0. Creatinine clearance was 44 ml/min.  LDH was 22, uric acid are 4.5, G6PD 10.4 (normal), hepatitis B surface antigen negative, hepatitis B core antibody negative, hepatitis C antibody negative. PT was 13.3 and PTT 36.  Echo on 04/04/2016 revealed an EF of 55-60%.  She has her port-a-cath placement today with Dr. Lucky Cowboy.  She attended the chemotherapy class.    Symptomatically, she has any new problems. Yesterday, she had a fever of 100.3-101.2. Feel did not feel good. She took ibuprofen which improved her temperature to 99.9. She notes no energy and a little bit listlessness. She has a little cough. She has no diarrhea. She has no other symptoms.   Past Medical History:  Diagnosis Date  . Cancer [facility])     Past Surgical History:  Procedure Laterality Date  . CARDIAC SURGERY  1993  . PERIPHERAL VASCULAR CATHETERIZATION N/A 04/06/2016   Procedure: Glori Luis Cath Insertion;  Surgeon: Algernon Huxley, MD;  Location: [facility];  Service: Cardiovascular;  Laterality: N/A;    Family History  Problem Relation Age of Onset  . Lung cancer Mother   . Lung cancer Father   . Diabetes  Maternal Uncle   . Myasthenia gravis Maternal Grandmother   . Leukemia Maternal Grandfather     Social History:  reports that she has quit smoking. She has never used smokeless tobacco. She reports that she drinks alcohol. Her drug history is not on file.  She smoked 1 1/2 packs per day for 20 years (30 pack year smoking history) until 3 months ago.  She is hearing impaired (deaf).  She lives in [place] with her boyfriend.  Her daughter lives in [place]. The patient is accompanied by her daughter [name]; mobile: [phone]; home: [phone]), son [name]), boyfriend [name]), and interpreter (sign language) today.  Allergies: No Known Allergies  Current Medications: Current Outpatient Prescriptions  Medication Sig Dispense Refill  . acetaminophen (TYLENOL) 325 MG tablet Take 650 mg by mouth.    Marland Kitchen aspirin (GOODSENSE ASPIRIN) 325 MG tablet Take 325 mg by mouth.    Marland Kitchen atorvastatin (LIPITOR) 80 MG tablet Take 80 mg by mouth.    . B Complex Vitamins (B COMPLEX 1 PO) Take by mouth.    Marland Kitchen FLUoxetine (PROZAC) 40 MG capsule Take 40 mg by mouth.    . Garlic 0254 MG CAPS Take by mouth.    . naproxen sodium (ANAPROX) 220 MG tablet Take 220 mg by mouth as needed.    . pantoprazole (PROTONIX) 40 MG tablet Take 40 mg by mouth.    . triamterene-hydrochlorothiazide (DYAZIDE) 37.5-25 MG capsule Take by mouth.    . vitamin B-12 (CYANOCOBALAMIN)  500 MCG tablet Take 500 mcg by mouth daily.    . vitamin E 400 UNIT capsule Take by mouth.    Marland Kitchen allopurinol (ZYLOPRIM) 300 MG tablet Take 1 tablet (300 mg total) by mouth daily. 30 tablet 3  . lidocaine-prilocaine (EMLA) cream Apply cream 1 hour before chemotherapy treatment and place small peive of saran wrap over cream to protect clothing 30 g 1  . ondansetron (ZOFRAN) 8 MG tablet Take 1 tablet (8 mg total) by mouth 2 (two) times daily as needed for refractory nausea / vomiting. Start on day 3 after cyclophosphamide chemotherapy. 30 tablet 1  . potassium  chloride (K-DUR) 10 MEQ tablet Take 1 tablet (10 mEq total) by mouth daily. Take 1 tablet daily for 4 days and then take as directed per [name] 20 tablet 0  . predniSONE (DELTASONE) 20 MG tablet Take 3.5 tablets (70 mg total) by mouth daily with breakfast. Take on days 1-5 of chemotherapy. 20 tablet 5  . promethazine (PHENERGAN) 12.5 MG tablet Take 1 tablet (12.5 mg total) by mouth every 6 (six) hours as needed for nausea or vomiting. 20 tablet 0   No current facility-administered medications for this visit.     Review of Systems:  GENERAL:  Fatigue.  Minimally active.  Temperature yesterday.  Weight loss of 2 pounds. PERFORMANCE STATUS (ECOG):  2-3 HEENT:  Deaf.  No visual changes, runny nose, sore throat, mouth sores or tenderness. Lungs: No shortness of breath.  Little cough.  No hemoptysis. Cardiac:  No chest pain, palpitations, orthopnea, or PND. GI:  No nausea, vomiting, diarrhea, constipation, melena or hematochezia. GU:  No urgency, frequency, dysuria, or hematuria. Musculoskeletal:  Bilateral leg pain (left > right).  Pain in knees and feet.  No back pain.  No muscle tenderness. Extremities:  No pain or swelling. Skin:  No rashes or skin changes. Neuro:  No headache, numbness or weakness, balance or coordination issues. Endocrine:  No diabetes, thyroid issues, hot flashes or night sweats. Psych:  No mood changes, depression or anxiety. Pain:  No focal pain. Review of systems:  All other systems reviewed and found to be negative.  Physical Exam: Blood pressure 133/79, pulse 77, temperature (!) 95.3 F (35.2 C), temperature source Tympanic, resp. rate 18, weight 143 lb 15.4 oz (65.3 kg). GENERAL:  Elderly sitting comfortably in a wheelchair in the exam room in no acute distress.  Slow gait.  She needs assistance onto the table. MENTAL STATUS:  Alert and oriented to person, place and time. HEAD: short curly gray hair.  Normocephalic, atraumatic, face symmetric, no Cushingoid  features. EYES:  Hazel eyes.  Pupils equal round and reactive to light and accomodation.  No conjunctivitis or scleral icterus. ENT:  Oropharynx clear without lesion.  Upper dentures.  Tongue normal. Mucous membranes moist.  RESPIRATORY:  Clear to auscultation without rales, wheezes or rhonchi. CARDIOVASCULAR:  Regular rate and rhythm without murmur, rub or gallop. ABDOMEN:  Soft, non-tender, with active bowel sounds, and no appreciable hepatosplenomegaly.  No masses. SKIN:  No rashes, ulcers or lesions. EXTREMITIES: No edema, no skin discoloration or tenderness.  No palpable cords. LYMPH NODES: Small < 1 cm axillary nodes.  Shotty inguinal adenopathy.  No palpable cervical, supraclavicular adenopathy  NEUROLOGICAL:  Appropriate. PSYCH:  Appropriate.   Appointment on 04/06/2016  Component Date Value Ref Range Status  . WBC 04/06/2016 11.3* 3.6 - 11.0 K/uL Final  . RBC 04/06/2016 2.97* 3.80 - 5.20 MIL/uL Final  . Hemoglobin 04/06/2016 9.2*  12.0 - 16.0 g/dL Final  . HCT 04/06/2016 26.3* 35.0 - 47.0 % Final  . MCV 04/06/2016 88.8  80.0 - 100.0 fL Final  . MCH 04/06/2016 30.9  26.0 - 34.0 pg Final  . MCHC 04/06/2016 34.8  32.0 - 36.0 g/dL Final  . RDW 04/06/2016 15.0* 11.5 - 14.5 % Final  . Platelets 04/06/2016 640* 150 - 440 K/uL Final  . Neutrophils Relative % 04/06/2016 66  % Final  . Neutro Abs 04/06/2016 7.5* 1.4 - 6.5 K/uL Final  . Lymphocytes Relative 04/06/2016 22  % Final  . Lymphs Abs 04/06/2016 2.4  1.0 - 3.6 K/uL Final  . Monocytes Relative 04/06/2016 10  % Final  . Monocytes Absolute 04/06/2016 1.2* 0.2 - 0.9 K/uL Final  . Eosinophils Relative 04/06/2016 1  % Final  . Eosinophils Absolute 04/06/2016 0.1  0 - 0.7 K/uL Final  . Basophils Relative 04/06/2016 1  % Final  . Basophils Absolute 04/06/2016 0.1  0 - 0.1 K/uL Final  . Sodium 04/06/2016 127* 135 - 145 mmol/L Final  . Potassium 04/06/2016 3.6  3.5 - 5.1 mmol/L Final  . Chloride 04/06/2016 96* 101 - 111 mmol/L Final   . CO2 04/06/2016 22  22 - 32 mmol/L Final  . Glucose, Bld 04/06/2016 98  65 - 99 mg/dL Final  . BUN 04/06/2016 19  6 - 20 mg/dL Final  . Creatinine, Ser 04/06/2016 1.15* 0.44 - 1.00 mg/dL Final  . Calcium 04/06/2016 10.2  8.9 - 10.3 mg/dL Final  . Total Protein 04/06/2016 6.6  6.5 - 8.1 g/dL Final  . Albumin 04/06/2016 2.8* 3.5 - 5.0 g/dL Final  . AST 04/06/2016 40  15 - 41 U/L Final  . ALT 04/06/2016 38  14 - 54 U/L Final  . Alkaline Phosphatase 04/06/2016 126  38 - 126 U/L Final  . Total Bilirubin 04/06/2016 0.8  0.3 - 1.2 mg/dL Final  . GFR calc non Af Amer 04/06/2016 46* >60 mL/min Final  . GFR calc Af Amer 04/06/2016 54* >60 mL/min Final   Comment: (NOTE) The eGFR has been calculated using the CKD EPI equation. This calculation has not been validated in all clinical situations. eGFR's persistently <60 mL/min signify possible Chronic Kidney Disease.   . Anion gap 04/06/2016 9  5 - 15 Final  . LDH 04/06/2016 122  98 - 192 U/L Final  . Uric Acid, Serum 04/06/2016 4.7  2.3 - 6.6 mg/dL Final  Office Visit on 04/06/2016  Component Date Value Ref Range Status  . Slide consult, pathology [facility] 04/06/2016    Final                   Value:Slide Consult, Pahtology [facility] CASE: [id number] PATIENT: Pearline Branum Surgical Consult Pathology Report     SPECIMEN SUBMITTED: A. Bone Core  CLINICAL HISTORY: Weight loss and hypercalcemia, found to have lung mass, PET scan shows multiple hypermetabolic lesions with involvement of lung, pleura, soft tissue of orbit, chest wall, retroperitoneum, iliac lymph node, 6 cm right iliac wing mass (site of biopsy)  PRE-OPERATIVE DIAGNOSIS: None provided  POST-OPERATIVE DIAGNOSIS: None provided  OUTSIDE ACCESSION #: [id number]  NUMBER OF BLOCKS RECEIVED: 0  NUMBER OF SLIDES RECEIVED 2     DIAGNOSIS: A. BONE, RIGHT ILIAC WING, NEEDLE CORE BIOPSIES (OUTSIDE SLIDES [id number], TAKEN 03/08/2016): - B-CELL LYMPHOMA, UNCLASSIFIABLE,  WITH FEATURES INTERMEDIATE BETWEEN DIFFUSE LARGE B-CELL LYMPHOMA AND CLASSICAL HODGKIN LYMPHOMA. - SEE HEMATOPATHOLOGY CONSULTATION REPORT MPLN [id number] FOR HISTOLOGIC DESCRIPTION, IHC AND ISH  RESULTS, AND ADDITIONAL COMMENTS                         .  Comment: The case was previously reviewed in consultation by Drs. Salome Spotted, and Dola at [facility] ([place] [place], [place].  Dr. Reuel Derby and I are in agreement that the submitted slides show histologic features consistent with the above diagnosis. No IHC slides were submitted. By report the large neoplastic cells are positive for CD20, PAX-5, EMA, CD45, CD79a, and CD30. They are negative for CD15, ALK, CD3, and pankeratin. ISH for EBER is negative. These results support the above diagnosis.   GROSS DESCRIPTION: Received from [facility], [place], [place], [place]  [address], are 2 hematoxylin and eosin-stained glass slides labeled [id number] A1-9 and A2-9, and accompanying pathology report. The slides are returned after review.  Final Diagnosis performed by Bryan Lemma, MD.  Electronically signed 04/06/2016 11:43:21AM    The electronic signature indicates that the named Attending Pathologist has evalu                         ated the specimen  Technical component performed at [facility], [address] [place], [place] [address] Lab: [phone] Dir: Darrick Penna. Evette Doffing, MD  Professional component performed at [facility], [facility], [place], [place], [place] [address] Lab: [phone] Dir: Dellia Nims. Rubinas, MD      Assessment:  Nyeshia Mysliwiec is a 75 y.o. female with stage IVB B-cell lymphoma, unclassifiable, with features intermediate between diffuse large B-cell lymphoma and classical Hodgkin's lymphoma ("gray zone" lymphoma).  She underwent right iliac wing bone/bone marrow biopsy on 03/08/2016.  Chest, abdomen and pelvic CT scan without  contrast on 02/20/2016 revealed a 3.7 cm subpleural mass within the left lower lobe, a 1.1 cm right lower lobe subpleural nodule , 9 mm RUL nodule, and 0.4 cm lingular nodule.  Between the left second, third, and fourth ribs there was 1.9 cm soft tissue mass. There was a 2.5 cm low-attenuation nodule in the left lobe of the thyroid.   There were prominent left axillary nodes (up to 1.0 cm).   There was no definite mediastinal adenopathy. There was a 5.5 x 4.6 cm cystic lesion in the left hepatic lobe.  There was a 3.1 cm nodule in the right adrenal gland. There was a 1.2 cm right retrocaval lymph node with additional prominent subcentimeter retroperitoneal nodes. There was 1.0 cm left common iliac node. There was a 6.2 x 5.2 cm soft tissue mass within the right ilium.  Head MRI on 02/21/2016 revealed no evidence of malignancy. 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   There was activity in the superior left ocular orbit.  There was a large lesion centered in the right iliac wing with soft tissue extension.  There were hypermetabolic right external iliac lymph nodes  She was admitted to [facility] in [facility].  She had renal failure and hypercalcemia. She was treated with IVF, calcitonin and Zometa.The patient has a history of coronary artery disease s/p CABG in 1993.  She has no history of heart failure.  She is deaf and requires an interpreter.  Echo on  04/04/2016 revealed an EF of 55-60%.  Hepatitis B and C testing was negative on 03/30/2016.   G6PD assay was normal.  Symptomatically, she is fatigued.  She had an isolated fever yesterday possibly secondary to tumor fever.  Exam is stable.  Plan: 1.  Labs today:  CBC with diff, CMP, LDH,  uric acid. 2.  Review echocardiogram and laboratory testing from last visit. Discuss plan for port-a-cath placement today. Await review of outside pathology regarding confirmation of diagnosis. We reviewed plan for mini-RCHOP chemotherapy every 3 weeks with advancement in dose as tolerated. She will receive Neulasta support with advancing doses if counts decline.  She will receive 5 days of steroids with each cycle (prescription sent).  Discuss tumor lysis syndrome.  Given multitude of current issues, we will postpone a lumbar puncture with intrathecal methotrexate with cycle #1.  Side effects of chemotherapy reviewed in detail. Numerous questions asked and answered.  3.  Rx:  EMLA cream, ondansetron, allopurinol, Phenergan. 4.  Rx:  prednisone x 5 days with each cycle. 5.  Phone follow-up with pathology today regarding diagnosis confirmation. 6.  RTC tomorrow for cycle #1 mini-RCHOP. 7.  RTC on 04/10/2016 for labs (BMP, uric acid). 8.  RTC on 04/17/2016 for MD assess and labs (CBC with diff, BMP, uric acid, ferritin, iron studies, B12, folate).  Addendum:  Over 40 minutes was spent with the patient.  Pathology in agreement with diagnosis.  OK to proceed with chemotherapy.   Lequita Asal, MD  04/06/2016

## 2016-04-17 ENCOUNTER — Other Ambulatory Visit: Payer: Self-pay | Admitting: *Deleted

## 2016-04-17 ENCOUNTER — Other Ambulatory Visit: Payer: Self-pay

## 2016-04-17 ENCOUNTER — Inpatient Hospital Stay (HOSPITAL_BASED_OUTPATIENT_CLINIC_OR_DEPARTMENT_OTHER): Payer: Medicare HMO | Admitting: Oncology

## 2016-04-17 ENCOUNTER — Inpatient Hospital Stay: Payer: Medicare HMO

## 2016-04-17 VITALS — BP 145/82 | HR 103 | Temp 97.4°F | Resp 18 | Wt 144.0 lb

## 2016-04-17 DIAGNOSIS — R05 Cough: Secondary | ICD-10-CM

## 2016-04-17 DIAGNOSIS — C8580 Other specified types of non-Hodgkin lymphoma, unspecified site: Principal | ICD-10-CM

## 2016-04-17 DIAGNOSIS — Z7982 Long term (current) use of aspirin: Secondary | ICD-10-CM

## 2016-04-17 DIAGNOSIS — D649 Anemia, unspecified: Secondary | ICD-10-CM | POA: Diagnosis not present

## 2016-04-17 DIAGNOSIS — Z79899 Other long term (current) drug therapy: Secondary | ICD-10-CM

## 2016-04-17 DIAGNOSIS — C8518 Unspecified B-cell lymphoma, lymph nodes of multiple sites: Secondary | ICD-10-CM

## 2016-04-17 DIAGNOSIS — C8330 Diffuse large B-cell lymphoma, unspecified site: Secondary | ICD-10-CM

## 2016-04-17 DIAGNOSIS — R918 Other nonspecific abnormal finding of lung field: Secondary | ICD-10-CM

## 2016-04-17 DIAGNOSIS — R509 Fever, unspecified: Secondary | ICD-10-CM

## 2016-04-17 DIAGNOSIS — Z5111 Encounter for antineoplastic chemotherapy: Secondary | ICD-10-CM | POA: Diagnosis not present

## 2016-04-17 DIAGNOSIS — Z87891 Personal history of nicotine dependence: Secondary | ICD-10-CM

## 2016-04-17 DIAGNOSIS — I2510 Atherosclerotic heart disease of native coronary artery without angina pectoris: Secondary | ICD-10-CM

## 2016-04-17 DIAGNOSIS — F17210 Nicotine dependence, cigarettes, uncomplicated: Secondary | ICD-10-CM

## 2016-04-17 DIAGNOSIS — H9193 Unspecified hearing loss, bilateral: Secondary | ICD-10-CM

## 2016-04-17 DIAGNOSIS — Z801 Family history of malignant neoplasm of trachea, bronchus and lung: Secondary | ICD-10-CM

## 2016-04-17 DIAGNOSIS — N19 Unspecified kidney failure: Secondary | ICD-10-CM

## 2016-04-17 DIAGNOSIS — E876 Hypokalemia: Secondary | ICD-10-CM

## 2016-04-17 DIAGNOSIS — R634 Abnormal weight loss: Secondary | ICD-10-CM

## 2016-04-17 LAB — CBC WITH DIFFERENTIAL/PLATELET
BASOS PCT: 2 %
Basophils Absolute: 0.1 10*3/uL (ref 0–0.1)
Eosinophils Absolute: 0.1 10*3/uL (ref 0–0.7)
Eosinophils Relative: 2 %
HEMATOCRIT: 23.9 % — AB (ref 35.0–47.0)
HEMOGLOBIN: 8.2 g/dL — AB (ref 12.0–16.0)
LYMPHS ABS: 1.1 10*3/uL (ref 1.0–3.6)
Lymphocytes Relative: 28 %
MCH: 30.7 pg (ref 26.0–34.0)
MCHC: 34.4 g/dL (ref 32.0–36.0)
MCV: 89.3 fL (ref 80.0–100.0)
MONOS PCT: 7 %
Monocytes Absolute: 0.3 10*3/uL (ref 0.2–0.9)
NEUTROS ABS: 2.5 10*3/uL (ref 1.4–6.5)
NEUTROS PCT: 61 %
Platelets: 265 10*3/uL (ref 150–440)
RBC: 2.67 MIL/uL — AB (ref 3.80–5.20)
RDW: 14.9 % — ABNORMAL HIGH (ref 11.5–14.5)
WBC: 4.1 10*3/uL (ref 3.6–11.0)

## 2016-04-17 LAB — IRON AND TIBC
Iron: 27 ug/dL — ABNORMAL LOW (ref 28–170)
Saturation Ratios: 11 % (ref 10.4–31.8)
TIBC: 240 ug/dL — ABNORMAL LOW (ref 250–450)
UIBC: 213 ug/dL

## 2016-04-17 LAB — VITAMIN B12: Vitamin B-12: 2409 pg/mL — ABNORMAL HIGH (ref 180–914)

## 2016-04-17 LAB — BASIC METABOLIC PANEL
Anion gap: 8 (ref 5–15)
BUN: 21 mg/dL — ABNORMAL HIGH (ref 6–20)
CO2: 23 mmol/L (ref 22–32)
Calcium: 7.9 mg/dL — ABNORMAL LOW (ref 8.9–10.3)
Chloride: 102 mmol/L (ref 101–111)
Creatinine, Ser: 1.05 mg/dL — ABNORMAL HIGH (ref 0.44–1.00)
GFR calc Af Amer: 60 mL/min — ABNORMAL LOW (ref >60)
GFR calc non Af Amer: 52 mL/min — ABNORMAL LOW (ref >60)
Glucose, Bld: 132 mg/dL — ABNORMAL HIGH (ref 65–99)
Potassium: 3.3 mmol/L — ABNORMAL LOW (ref 3.5–5.1)
Sodium: 133 mmol/L — ABNORMAL LOW (ref 135–145)

## 2016-04-17 LAB — FOLATE: Folate: 10.8 ng/mL (ref >5.9)

## 2016-04-17 LAB — ALBUMIN: Albumin: 2.9 g/dL — ABNORMAL LOW (ref 3.5–5.0)

## 2016-04-17 LAB — FERRITIN: Ferritin: 444 ng/mL — ABNORMAL HIGH (ref 11–307)

## 2016-04-17 NOTE — Progress Notes (Signed)
Patient accompanied by interpreter and caretaker who states patient had nausea yesterday along with diarrhea X 4.  Also had temp yesterday of 99.3.  States after last chemo treatment she had a "knot" come up in her stomach for about 2 days..  It has since resolved.  States she is doing well.

## 2016-04-23 ENCOUNTER — Emergency Department: Payer: Medicare HMO

## 2016-04-23 ENCOUNTER — Emergency Department
Admission: EM | Admit: 2016-04-23 | Discharge: 2016-04-24 | Disposition: A | Discharge status: Home / Self Care | Payer: Medicare HMO | Attending: Emergency Medicine | Admitting: Emergency Medicine

## 2016-04-23 ENCOUNTER — Encounter: Payer: Self-pay | Admitting: Emergency Medicine

## 2016-04-23 DIAGNOSIS — Z8572 Personal history of non-Hodgkin lymphomas: Secondary | ICD-10-CM | POA: Insufficient documentation

## 2016-04-23 DIAGNOSIS — Z791 Long term (current) use of non-steroidal anti-inflammatories (NSAID): Secondary | ICD-10-CM | POA: Insufficient documentation

## 2016-04-23 DIAGNOSIS — E860 Dehydration: Secondary | ICD-10-CM | POA: Insufficient documentation

## 2016-04-23 DIAGNOSIS — I10 Essential (primary) hypertension: Secondary | ICD-10-CM | POA: Diagnosis not present

## 2016-04-23 DIAGNOSIS — R509 Fever, unspecified: Secondary | ICD-10-CM

## 2016-04-23 DIAGNOSIS — Z7982 Long term (current) use of aspirin: Secondary | ICD-10-CM | POA: Insufficient documentation

## 2016-04-23 DIAGNOSIS — Z79899 Other long term (current) drug therapy: Secondary | ICD-10-CM | POA: Diagnosis not present

## 2016-04-23 DIAGNOSIS — Z87891 Personal history of nicotine dependence: Secondary | ICD-10-CM | POA: Insufficient documentation

## 2016-04-23 DIAGNOSIS — R112 Nausea with vomiting, unspecified: Secondary | ICD-10-CM

## 2016-04-23 HISTORY — DX: Essential (primary) hypertension: I10

## 2016-04-23 HISTORY — DX: Unspecified osteoarthritis, unspecified site: M19.90

## 2016-04-23 LAB — URINALYSIS COMPLETE WITH MICROSCOPIC (ARMC ONLY)
Bilirubin Urine: NEGATIVE
GLUCOSE, UA: NEGATIVE mg/dL
HGB URINE DIPSTICK: NEGATIVE
KETONES UR: NEGATIVE mg/dL
LEUKOCYTES UA: NEGATIVE
NITRITE: NEGATIVE
Protein, ur: 100 mg/dL — AB
SPECIFIC GRAVITY, URINE: 1.015 (ref 1.005–1.030)
pH: 5 (ref 5.0–8.0)

## 2016-04-23 LAB — COMPREHENSIVE METABOLIC PANEL
ALT: 29 U/L (ref 14–54)
AST: 30 U/L (ref 15–41)
Albumin: 3.3 g/dL — ABNORMAL LOW (ref 3.5–5.0)
Alkaline Phosphatase: 94 U/L (ref 38–126)
Anion gap: 10 (ref 5–15)
BUN: 24 mg/dL — ABNORMAL HIGH (ref 6–20)
CHLORIDE: 99 mmol/L — AB (ref 101–111)
CO2: 23 mmol/L (ref 22–32)
Calcium: 8.5 mg/dL — ABNORMAL LOW (ref 8.9–10.3)
Creatinine, Ser: 1.34 mg/dL — ABNORMAL HIGH (ref 0.44–1.00)
GFR, EST AFRICAN AMERICAN: 44 mL/min — AB (ref >60)
GFR, EST NON AFRICAN AMERICAN: 38 mL/min — AB (ref >60)
Glucose, Bld: 147 mg/dL — ABNORMAL HIGH (ref 65–99)
POTASSIUM: 2.9 mmol/L — AB (ref 3.5–5.1)
Sodium: 132 mmol/L — ABNORMAL LOW (ref 135–145)
Total Bilirubin: 1.3 mg/dL — ABNORMAL HIGH (ref 0.3–1.2)
Total Protein: 6.8 g/dL (ref 6.5–8.1)

## 2016-04-23 LAB — CBC WITH DIFFERENTIAL/PLATELET
BASOS ABS: 0.1 10*3/uL (ref 0–0.1)
Basophils Relative: 1 %
EOS PCT: 0 %
Eosinophils Absolute: 0 10*3/uL (ref 0–0.7)
HCT: 30.6 % — ABNORMAL LOW (ref 35.0–47.0)
Hemoglobin: 10.5 g/dL — ABNORMAL LOW (ref 12.0–16.0)
LYMPHS PCT: 17 %
Lymphs Abs: 0.9 10*3/uL — ABNORMAL LOW (ref 1.0–3.6)
MCH: 30.7 pg (ref 26.0–34.0)
MCHC: 34.3 g/dL (ref 32.0–36.0)
MCV: 89.4 fL (ref 80.0–100.0)
MONO ABS: 1.2 10*3/uL — AB (ref 0.2–0.9)
Monocytes Relative: 23 %
Neutro Abs: 3.2 10*3/uL (ref 1.4–6.5)
Neutrophils Relative %: 59 %
PLATELETS: 365 10*3/uL (ref 150–440)
RBC: 3.42 MIL/uL — ABNORMAL LOW (ref 3.80–5.20)
RDW: 16.1 % — AB (ref 11.5–14.5)
WBC: 5.4 10*3/uL (ref 3.6–11.0)

## 2016-04-23 MED ORDER — ACETAMINOPHEN 500 MG PO TABS
1000.0000 mg | ORAL_TABLET | Freq: Once | ORAL | Status: AC
  Administered 2016-04-23: 1000 mg via ORAL

## 2016-04-23 MED ORDER — SODIUM CHLORIDE 0.9 % IV BOLUS (SEPSIS)
1000.0000 mL | Freq: Once | INTRAVENOUS | Status: AC
  Administered 2016-04-24: 1000 mL via INTRAVENOUS

## 2016-04-23 MED ORDER — ACETAMINOPHEN 500 MG PO TABS
ORAL_TABLET | ORAL | Status: AC
  Administered 2016-04-23: 1000 mg via ORAL
  Filled 2016-04-23: qty 2

## 2016-04-23 NOTE — ED Triage Notes (Signed)
Pt presents to ED with fever of 102.8 and vomiting X2 today. Took antinausea medication about 2 hours ago which seems to be helping. Currently being tx for lymphoma at this hospital. Started 2.5 weeks ago; 1 chemo treatment so far and next is this Friday. Denies any headache, abd pain, or chest pain currently. Pt alert and calm at this time. No increased work of breathing or acute distress noted.

## 2016-04-23 NOTE — ED Notes (Signed)
Pt reports hx of lymphoma. Pt received first chemo treatment on 04/07/21. Pt due for next 04/28/16

## 2016-04-24 ENCOUNTER — Encounter: Payer: Self-pay | Admitting: Emergency Medicine

## 2016-04-24 ENCOUNTER — Telehealth: Payer: Self-pay | Admitting: *Deleted

## 2016-04-24 MED ORDER — ONDANSETRON 4 MG PO TBDP: 4.0000 mg | ORAL_TABLET | Freq: Three times a day (TID) | ORAL | 0 refills | Status: DC | PRN

## 2016-04-24 NOTE — ED Notes (Signed)
Reviewed d/c instructions, follow-up care, and prescriptions with pt. Pt communicated understanding

## 2016-04-24 NOTE — ED Notes (Signed)
Pt tolerated food/fluids with no issue. MD informed

## 2016-04-24 NOTE — Telephone Encounter (Signed)
I spoke with Toni Griffith and gave her instructions for potassium use and she read it back to me.

## 2016-04-24 NOTE — ED Provider Notes (Signed)
[facility] Emergency Department Provider Note   ____________________________________________   First MD Initiated Contact with Patient 04/23/16 2310     (approximate)  I have reviewed the triage vital signs and the nursing notes.   HISTORY  Chief Complaint Fever and Vomiting    HPI Toni Griffith is a 75 y.o. female who comes into the hospital today with fever and vomiting. The patient has a history of non-Hodgkin's lymphoma. At home she had a temperature to 102. She checked her temperature again and it was 101. They called their oncology physician on-call was told to come in and get checked out. The patient had a chemotherapy treatment on September 8 and then another one September 29 which is coming up this week. She denies any diarrhea or abdominal pain. She does have a grandson who was coughing and with runny nose but the patient has not had any sick contacts with vomiting and fever. The patient was out and about though on Thursday and Friday. She vomited twice today and it was liquid and whenever she has tried to drink. The patient hasn't eaten at all today. After her second episode of vomiting she was able to keep down some in short. She reports that when she gets up and occasionally she does feel dizzy. The patient denies any chest pain, shortness of breath, headache, blurred vision, pain with urination. She has had some dark urine today. The patient also has some occasional mild back pain but nothing worse today. She is here for evaluation.   Past Medical History:  Diagnosis Date  . Arthritis   . Cancer ([place])   . Hypertension     Patient Active Problem List   Diagnosis Date Noted  . Hypercalcemia 03/30/2016  . Anemia 03/30/2016  . 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 Hodgkin's lymphoma ([place]) 03/08/2016    Past Surgical History:  Procedure Laterality Date  . CARDIAC SURGERY  1993  . CYST REMOVAL HAND    . PERIPHERAL  VASCULAR CATHETERIZATION N/A 04/06/2016   Procedure: Glori Luis Cath Insertion;  Surgeon: Algernon Huxley, MD;  Location: [place] CV [facility];  Service: Cardiovascular;  Laterality: N/A;    Prior to Admission medications   Medication Sig Start Date End Date Taking? Authorizing Provider  acetaminophen (TYLENOL) 325 MG tablet Take 650 mg by mouth.    Historical Provider, MD  allopurinol (ZYLOPRIM) 300 MG tablet Take 1 tablet (300 mg total) by mouth daily. 04/06/16   Lequita Asal, MD  aspirin (GOODSENSE ASPIRIN) 325 MG tablet Take 325 mg by mouth.    Historical Provider, MD  atorvastatin (LIPITOR) 80 MG tablet Take 80 mg by mouth. 10/14/15 10/13/16  Historical Provider, MD  B Complex Vitamins (B COMPLEX 1 PO) Take by mouth.    Historical Provider, MD  FLUoxetine (PROZAC) 40 MG capsule Take 40 mg by mouth. 10/14/15 10/13/16  Historical Provider, MD  Garlic 0349 MG CAPS Take by mouth.    Historical Provider, MD  lidocaine-prilocaine (EMLA) cream Apply cream 1 hour before chemotherapy treatment and place small peive of saran wrap over cream to protect clothing 04/06/16   Lequita Asal, MD  naproxen sodium (ANAPROX) 220 MG tablet Take 220 mg by mouth as needed.    Historical Provider, MD  ondansetron (ZOFRAN ODT) 4 MG disintegrating tablet Take 1 tablet (4 mg total) by mouth every 8 (eight) hours as needed for nausea or vomiting. 04/24/16   Loney Hering, MD  ondansetron [facility])  8 MG tablet Take 1 tablet (8 mg total) by mouth 2 (two) times daily as needed for refractory nausea / vomiting. Start on day 3 after cyclophosphamide chemotherapy. 04/06/16   Lequita Asal, MD  pantoprazole (PROTONIX) 40 MG tablet Take 40 mg by mouth. 02/04/16   Historical Provider, MD  potassium chloride (K-DUR) 10 MEQ tablet Take 1 tablet (10 mEq total) by mouth daily. Take 1 tablet daily for 4 days and then take as directed per [name] 04/10/16   Lequita Asal, MD  predniSONE (DELTASONE) 20 MG tablet Take 3.5 tablets  (70 mg total) by mouth daily with breakfast. Take on days 1-5 of chemotherapy. 04/06/16   Lequita Asal, MD  promethazine (PHENERGAN) 12.5 MG tablet Take 1 tablet (12.5 mg total) by mouth every 6 (six) hours as needed for nausea or vomiting. 04/07/16   Lequita Asal, MD  triamterene-hydrochlorothiazide (DYAZIDE) 37.5-25 MG capsule Take by mouth. 10/14/15 10/13/16  Historical Provider, MD  vitamin B-12 (CYANOCOBALAMIN) 500 MCG tablet Take 500 mcg by mouth daily.    Historical Provider, MD  vitamin E 400 UNIT capsule Take by mouth.    Historical Provider, MD    Allergies Review of patient's allergies indicates no known allergies.  Family History  Problem Relation Age of Onset  . Lung cancer Mother   . Lung cancer Father   . Diabetes Maternal Uncle   . Myasthenia gravis Maternal Grandmother   . Leukemia Maternal Grandfather     Social History Social History  Substance Use Topics  . Smoking status: Former Research scientist (life sciences)  . Smokeless tobacco: Never Used  . Alcohol use Yes    Review of Systems Constitutional: fever Eyes: No visual changes. ENT: No sore throat. Cardiovascular: Denies chest pain. Respiratory: Denies shortness of breath. Gastrointestinal: Nausea and vomiting with No abdominal pain. No diarrhea.  No constipation. Genitourinary: Negative for dysuria. Musculoskeletal: Occasional back pain. Skin: Negative for rash. Neurological: Negative for headaches, focal weakness or numbness.  10-point ROS otherwise negative.  ____________________________________________   PHYSICAL EXAM:  VITAL SIGNS: ED Triage Vitals  Enc Vitals Group     BP 04/23/16 2032 116/62     Pulse Rate 04/23/16 2032 97     Resp 04/23/16 2032 18     Temp 04/23/16 2032 (!) 101.8 F (38.8 C)     Temp Source 04/23/16 2032 Oral     SpO2 04/23/16 2032 97 %     Weight 04/23/16 2033 144 lb (65.3 kg)     Height 04/23/16 2033 '5\' 3"'$  (1.6 m)     Head Circumference --      Peak Flow --      Pain Score  04/23/16 2033 0     Pain Loc --      Pain Edu? --      Excl. in [place]? --     Constitutional: Alert and oriented. Well appearing and in no acute distress. Eyes: Conjunctivae are normal. PERRL. EOMI. Head: Atraumatic. Nose: No congestion/rhinnorhea. Mouth/Throat: Mucous membranes are moist.  Oropharynx non-erythematous. Cardiovascular: Normal rate, regular rhythm. Grossly normal heart sounds.  Good peripheral circulation. Respiratory: Normal respiratory effort.  No retractions. Mild crackles in left base Gastrointestinal: Soft and nontender. No distention. Positive bowel sounds Musculoskeletal: No lower extremity tenderness nor edema.  . Neurologic:  Normal speech and language.  Skin:  Skin is warm, dry and intact. Marland Kitchen Psychiatric: Mood and affect are normal.   ____________________________________________   LABS (all labs ordered are listed, but only  abnormal results are displayed)  Labs Reviewed  CBC WITH DIFFERENTIAL/PLATELET - Abnormal; Notable for the following:       Result Value   RBC 3.42 (*)    Hemoglobin 10.5 (*)    HCT 30.6 (*)    RDW 16.1 (*)    Lymphs Abs 0.9 (*)    Monocytes Absolute 1.2 (*)    All other components within normal limits  COMPREHENSIVE METABOLIC PANEL - Abnormal; Notable for the following:    Sodium 132 (*)    Potassium 2.9 (*)    Chloride 99 (*)    Glucose, Bld 147 (*)    BUN 24 (*)    Creatinine, Ser 1.34 (*)    Calcium 8.5 (*)    Albumin 3.3 (*)    Total Bilirubin 1.3 (*)    GFR calc non Af Amer 38 (*)    GFR calc Af Amer 44 (*)    All other components within normal limits  URINALYSIS COMPLETEWITH MICROSCOPIC ([facility] ONLY) - Abnormal; Notable for the following:    Color, Urine YELLOW (*)    APPearance HAZY (*)    Protein, ur 100 (*)    Bacteria, UA RARE (*)    Squamous Epithelial / LPF 0-5 (*)    All other components within normal limits  URINE CULTURE  CULTURE, BLOOD (ROUTINE X 2)  CULTURE, BLOOD (ROUTINE X 2)    ____________________________________________  EKG  none ____________________________________________  RADIOLOGY  CXR ____________________________________________   PROCEDURES  Procedure(s) performed: None  Procedures  Critical Care performed: No  ____________________________________________   INITIAL IMPRESSION / ASSESSMENT AND PLAN / ED COURSE  Pertinent labs & imaging results that were available during my care of the patient were reviewed by me and considered in my medical decision making (see chart for details).  This is a 75 year old female who comes into the hospital today with a fever and vomiting. The patient has a history of lymphoma. I will give the patient a liter of normal saline as she has had some dark urine and has complained of some dizziness with standing. I will check some blood work as well as a chest x-ray. I will reassess the patient once I received the results of her blood work.  Clinical Course  Value Comment By Time  DG Chest 2 View Stable cardiomegaly. Pulmonary vascular congestion. Persistent though less conspicuous posterior lung mass.   Loney Hering, MD 09/24 2344    Patient does appear to have some dehydration with some mild elevation of her creatinine and some mild electrolyte abnormalities. The patient does not have an elevated white blood cell count and her H&H are higher than previous. I will contact the oncology physician to determine what further steps I need to take in this patient's care. Her chest x-ray does not show any pneumonia at this time.  I contacted [name] the patient's oncologist. The patient appears well and is able to take by mouth. She feels that if the patient is looking well that she does not need any antibiotics but she can follow-up in the office. She also feels that since the patient's blood work is unremarkable she can be discharged home. I discussed this with the patient's family and they have no further  questions or concerns. The patient be discharged home. ____________________________________________   FINAL CLINICAL IMPRESSION(S) / ED DIAGNOSES  Final diagnoses:  Fever, unspecified fever cause  Non-intractable vomiting with nausea, vomiting of unspecified type  Dehydration      NEW  MEDICATIONS STARTED DURING THIS VISIT:  New Prescriptions   ONDANSETRON (ZOFRAN ODT) 4 MG DISINTEGRATING TABLET    Take 1 tablet (4 mg total) by mouth every 8 (eight) hours as needed for nausea or vomiting.     Note:  This document was prepared using Dragon voice recognition software and may include unintentional dictation errors.    Loney Hering, MD 04/24/16 [phone]

## 2016-04-24 NOTE — Telephone Encounter (Signed)
Called to report that she was in ER last night and her K+ is low and she was instructed to contact Dr Mike Gip regarding this matter.   Per Dr Mike Gip, have her take 2 tabs (20 meq) today then 2 tabs (20 meq twice tomorrow for total dose of 40 meq on 9/26) the 2 tabs (20 meq) daily. I have returned call to daughter, but had to leave VM to return my call

## 2016-04-25 LAB — URINE CULTURE

## 2016-04-28 ENCOUNTER — Other Ambulatory Visit: Payer: Self-pay | Admitting: *Deleted

## 2016-04-28 ENCOUNTER — Inpatient Hospital Stay: Payer: Medicare HMO

## 2016-04-28 ENCOUNTER — Encounter: Payer: Self-pay | Admitting: Hematology and Oncology

## 2016-04-28 ENCOUNTER — Other Ambulatory Visit: Payer: Self-pay | Admitting: Hematology and Oncology

## 2016-04-28 ENCOUNTER — Inpatient Hospital Stay (HOSPITAL_BASED_OUTPATIENT_CLINIC_OR_DEPARTMENT_OTHER): Payer: Medicare HMO | Admitting: Hematology and Oncology

## 2016-04-28 VITALS — BP 109/69 | HR 83 | Temp 97.6°F | Resp 18

## 2016-04-28 VITALS — BP 117/73 | HR 81 | Temp 98.5°F | Resp 18 | Wt 147.0 lb

## 2016-04-28 DIAGNOSIS — D649 Anemia, unspecified: Secondary | ICD-10-CM | POA: Diagnosis not present

## 2016-04-28 DIAGNOSIS — E876 Hypokalemia: Secondary | ICD-10-CM | POA: Diagnosis not present

## 2016-04-28 DIAGNOSIS — R918 Other nonspecific abnormal finding of lung field: Secondary | ICD-10-CM

## 2016-04-28 DIAGNOSIS — I2510 Atherosclerotic heart disease of native coronary artery without angina pectoris: Secondary | ICD-10-CM

## 2016-04-28 DIAGNOSIS — N19 Unspecified kidney failure: Secondary | ICD-10-CM

## 2016-04-28 DIAGNOSIS — C8518 Unspecified B-cell lymphoma, lymph nodes of multiple sites: Secondary | ICD-10-CM

## 2016-04-28 DIAGNOSIS — C8580 Other specified types of non-Hodgkin lymphoma, unspecified site: Principal | ICD-10-CM

## 2016-04-28 DIAGNOSIS — C7951 Secondary malignant neoplasm of bone: Secondary | ICD-10-CM

## 2016-04-28 DIAGNOSIS — Z87891 Personal history of nicotine dependence: Secondary | ICD-10-CM

## 2016-04-28 DIAGNOSIS — R8290 Unspecified abnormal findings in urine: Secondary | ICD-10-CM

## 2016-04-28 DIAGNOSIS — Z5111 Encounter for antineoplastic chemotherapy: Secondary | ICD-10-CM | POA: Diagnosis not present

## 2016-04-28 DIAGNOSIS — C8330 Diffuse large B-cell lymphoma, unspecified site: Secondary | ICD-10-CM

## 2016-04-28 DIAGNOSIS — Z7982 Long term (current) use of aspirin: Secondary | ICD-10-CM

## 2016-04-28 DIAGNOSIS — C8198 Hodgkin lymphoma, unspecified, lymph nodes of multiple sites: Secondary | ICD-10-CM

## 2016-04-28 DIAGNOSIS — R05 Cough: Secondary | ICD-10-CM

## 2016-04-28 DIAGNOSIS — H9193 Unspecified hearing loss, bilateral: Secondary | ICD-10-CM

## 2016-04-28 DIAGNOSIS — R634 Abnormal weight loss: Secondary | ICD-10-CM

## 2016-04-28 DIAGNOSIS — Z79899 Other long term (current) drug therapy: Secondary | ICD-10-CM

## 2016-04-28 DIAGNOSIS — R509 Fever, unspecified: Secondary | ICD-10-CM

## 2016-04-28 DIAGNOSIS — Z801 Family history of malignant neoplasm of trachea, bronchus and lung: Secondary | ICD-10-CM

## 2016-04-28 LAB — COMPREHENSIVE METABOLIC PANEL
ALBUMIN: 2.5 g/dL — AB (ref 3.5–5.0)
ALK PHOS: 94 U/L (ref 38–126)
ALT: 33 U/L (ref 14–54)
AST: 31 U/L (ref 15–41)
Anion gap: 5 (ref 5–15)
BILIRUBIN TOTAL: 0.8 mg/dL (ref 0.3–1.2)
BUN: 17 mg/dL (ref 6–20)
CALCIUM: 8 mg/dL — AB (ref 8.9–10.3)
CO2: 22 mmol/L (ref 22–32)
Chloride: 104 mmol/L (ref 101–111)
Creatinine, Ser: 1.05 mg/dL — ABNORMAL HIGH (ref 0.44–1.00)
GFR calc Af Amer: 60 mL/min — ABNORMAL LOW (ref >60)
GFR, EST NON AFRICAN AMERICAN: 51 mL/min — AB (ref >60)
Glucose, Bld: 108 mg/dL — ABNORMAL HIGH (ref 65–99)
POTASSIUM: 3.8 mmol/L (ref 3.5–5.1)
Sodium: 131 mmol/L — ABNORMAL LOW (ref 135–145)
TOTAL PROTEIN: 5.7 g/dL — AB (ref 6.5–8.1)

## 2016-04-28 LAB — CULTURE, BLOOD (ROUTINE X 2): Culture: NO GROWTH

## 2016-04-28 LAB — CBC WITH DIFFERENTIAL/PLATELET
Basophils Absolute: 0.1 10*3/uL (ref 0–0.1)
Basophils Relative: 1 %
Eosinophils Absolute: 0.1 10*3/uL (ref 0–0.7)
Eosinophils Relative: 1 %
HEMATOCRIT: 23.3 % — AB (ref 35.0–47.0)
HEMOGLOBIN: 8 g/dL — AB (ref 12.0–16.0)
LYMPHS ABS: 1.8 10*3/uL (ref 1.0–3.6)
LYMPHS PCT: 18 %
MCH: 30.2 pg (ref 26.0–34.0)
MCHC: 34.3 g/dL (ref 32.0–36.0)
MCV: 88 fL (ref 80.0–100.0)
MONOS PCT: 13 %
Monocytes Absolute: 1.3 10*3/uL — ABNORMAL HIGH (ref 0.2–0.9)
NEUTROS ABS: 6.8 10*3/uL — AB (ref 1.4–6.5)
NEUTROS PCT: 67 %
Platelets: 408 10*3/uL (ref 150–440)
RBC: 2.65 MIL/uL — AB (ref 3.80–5.20)
RDW: 16 % — ABNORMAL HIGH (ref 11.5–14.5)
WBC: 10.1 10*3/uL (ref 3.6–11.0)

## 2016-04-28 LAB — URINALYSIS COMPLETE WITH MICROSCOPIC (ARMC ONLY)
Bilirubin Urine: NEGATIVE
Glucose, UA: >500 mg/dL — AB
Hgb urine dipstick: NEGATIVE
Ketones, ur: NEGATIVE mg/dL
Leukocytes, UA: NEGATIVE
Nitrite: NEGATIVE
Protein, ur: 30 mg/dL — AB
Specific Gravity, Urine: 1.014 (ref 1.005–1.030)
pH: 5 (ref 5.0–8.0)

## 2016-04-28 MED ORDER — SODIUM CHLORIDE 0.9 % IV SOLN
375.0000 mg/m2 | Freq: Once | INTRAVENOUS | Status: AC
  Administered 2016-04-28: 600 mg via INTRAVENOUS
  Filled 2016-04-28: qty 50

## 2016-04-28 MED ORDER — ACETAMINOPHEN 325 MG PO TABS
650.0000 mg | ORAL_TABLET | Freq: Once | ORAL | Status: AC
  Administered 2016-04-28: 650 mg via ORAL
  Filled 2016-04-28: qty 2

## 2016-04-28 MED ORDER — CYCLOPHOSPHAMIDE CHEMO INJECTION 1 GM
400.0000 mg/m2 | Freq: Once | INTRAMUSCULAR | Status: AC
  Administered 2016-04-28: 680 mg via INTRAVENOUS
  Filled 2016-04-28: qty 34

## 2016-04-28 MED ORDER — DOXORUBICIN HCL CHEMO IV INJECTION 2 MG/ML
25.0000 mg/m2 | Freq: Once | INTRAVENOUS | Status: AC
  Administered 2016-04-28: 42 mg via INTRAVENOUS
  Filled 2016-04-28: qty 21

## 2016-04-28 MED ORDER — PALONOSETRON HCL INJECTION 0.25 MG/5ML
0.2500 mg | Freq: Once | INTRAVENOUS | Status: AC
  Administered 2016-04-28: 0.25 mg via INTRAVENOUS
  Filled 2016-04-28: qty 5

## 2016-04-28 MED ORDER — VINCRISTINE SULFATE CHEMO INJECTION 1 MG/ML
1.0000 mg | Freq: Once | INTRAVENOUS | Status: AC
  Administered 2016-04-28: 1 mg via INTRAVENOUS
  Filled 2016-04-28: qty 1

## 2016-04-28 MED ORDER — SODIUM CHLORIDE 0.9 % IV SOLN: 375.0000 mg/m2 | Freq: Once | INTRAVENOUS | Status: DC

## 2016-04-28 MED ORDER — SODIUM CHLORIDE 0.9% FLUSH
10.0000 mL | Freq: Once | INTRAVENOUS | Status: AC
  Administered 2016-04-28: 10 mL via INTRAVENOUS
  Filled 2016-04-28: qty 10

## 2016-04-28 MED ORDER — SODIUM CHLORIDE 0.9 % IV SOLN
10.0000 mg | Freq: Once | INTRAVENOUS | Status: AC
  Administered 2016-04-28: 10 mg via INTRAVENOUS
  Filled 2016-04-28: qty 1

## 2016-04-28 MED ORDER — HEPARIN SOD (PORK) LOCK FLUSH 100 UNIT/ML IV SOLN: 500.0000 [IU] | Freq: Once | INTRAVENOUS | Status: DC | PRN

## 2016-04-28 MED ORDER — SODIUM CHLORIDE 0.9% FLUSH
10.0000 mL | INTRAVENOUS | Status: DC | PRN
  Administered 2016-04-28: 10 mL
  Filled 2016-04-28: qty 10

## 2016-04-28 MED ORDER — HEPARIN SOD (PORK) LOCK FLUSH 100 UNIT/ML IV SOLN
500.0000 [IU] | Freq: Once | INTRAVENOUS | Status: AC
  Administered 2016-04-28: 500 [IU] via INTRAVENOUS
  Filled 2016-04-28: qty 5

## 2016-04-28 MED ORDER — SODIUM CHLORIDE 0.9 % IV SOLN
Freq: Once | INTRAVENOUS | Status: AC
  Administered 2016-04-28: 12:00:00 via INTRAVENOUS
  Filled 2016-04-28: qty 1000

## 2016-04-28 MED ORDER — DIPHENHYDRAMINE HCL 25 MG PO CAPS
50.0000 mg | ORAL_CAPSULE | Freq: Once | ORAL | Status: AC
  Administered 2016-04-28: 50 mg via ORAL
  Filled 2016-04-28: qty 2

## 2016-04-28 NOTE — Progress Notes (Signed)
Patient is here for follow up.    She declines flu shot.

## 2016-04-28 NOTE — Progress Notes (Signed)
.Toni Griffith day:  04/28/2016   Chief Complaint: Toni Griffith is a 75 y.o. 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  HPI:  The patient was last seen in the medical oncology Griffith by me on 04/06/2016.  At that time, she was fatigued.  She had a low grade fever the day prior.  She had no evidence of infection.  She received cycle #1 mini-RCHOP.  She saw Dr. Grayland Ormond in my absence on 04/17/2016.  She described transient mouth sores.  CBC included a hematocrit of 23.9, hemoglobin 8.2, MCV 89.3, platelets 265,000, WBC 4100 [place] 175[address].  Potassium was 3.3.  She was seen in the ER on 04/23/2016 with fever and vomiting.  CBC revealed a hematocrit of 30.6, hemoglobin 10.5, WBC 5400 with an ANC of 3200.  She received IVF.  CXR was negative.  Urinalysis was contaminated.  Cultures were drawn (negative).  Symptomatically, she feels a little bit better than she did prior to her first cycle of chemotherapy. She denies any significant problems with her chemotherapy. She denies any urinary symptoms. She been taking her potassium.  She has some leg pain and arthritis.   Past Medical History:  Diagnosis Date  . Arthritis   . Cancer ([place])   . Hypertension     Past Surgical History:  Procedure Laterality Date  . CARDIAC SURGERY  1993  . CYST REMOVAL HAND    . PERIPHERAL VASCULAR CATHETERIZATION N/A 04/06/2016   Procedure: Glori Luis Cath Insertion;  Surgeon: Algernon Huxley, MD;  Location: [place] CV LAB;  Service: Cardiovascular;  Laterality: N/A;    Family History  Problem Relation Age of Onset  . Lung cancer Mother   . Lung cancer Father   . Diabetes Maternal Uncle   . Myasthenia gravis Maternal Grandmother   . Leukemia Maternal Grandfather     Social History:  reports that she has quit smoking. She has never used smokeless tobacco. She reports that she drinks alcohol. She reports that she does not  use drugs.  She smoked 1 1/2 packs per day for 20 years (30 pack year smoking history) until 3 months ago.  She is hearing impaired (deaf).  She lives in [place] with her boyfriend.  Her daughter lives in [place]. The patient is accompanied by her son Toni Griffith) and the interpreter (sign language) today.  Allergies: No Known Allergies  Current Medications: Current Outpatient Prescriptions  Medication Sig Dispense Refill  . acetaminophen (TYLENOL) 325 MG tablet Take 650 mg by mouth.    Marland Kitchen allopurinol (ZYLOPRIM) 300 MG tablet Take 1 tablet (300 mg total) by mouth daily. 30 tablet 3  . aspirin (GOODSENSE ASPIRIN) 325 MG tablet Take 325 mg by mouth.    Marland Kitchen atorvastatin (LIPITOR) 80 MG tablet Take 80 mg by mouth.    . B Complex Vitamins (B COMPLEX 1 PO) Take by mouth.    Marland Kitchen FLUoxetine (PROZAC) 40 MG capsule Take 40 mg by mouth.    . Garlic 2585 MG CAPS Take by mouth.    . lidocaine-prilocaine (EMLA) cream Apply cream 1 hour before chemotherapy treatment and place small peive of saran wrap over cream to protect clothing 30 g 1  . naproxen sodium (ANAPROX) 220 MG tablet Take 220 mg by mouth as needed.    . ondansetron (ZOFRAN) 8 MG tablet Take 1 tablet (8 mg total) by mouth 2 (two) times daily  as needed for refractory nausea / vomiting. Start on day 3 after cyclophosphamide chemotherapy. 30 tablet 1  . pantoprazole (PROTONIX) 40 MG tablet Take 40 mg by mouth.    . potassium chloride (K-DUR) 10 MEQ tablet Take 1 tablet (10 mEq total) by mouth daily. Take 1 tablet daily for 4 days and then take as directed per [name] 20 tablet 0  . predniSONE (DELTASONE) 20 MG tablet Take 3.5 tablets (70 mg total) by mouth daily with breakfast. Take on days 1-5 of chemotherapy. 20 tablet 5  . promethazine (PHENERGAN) 12.5 MG tablet Take 1 tablet (12.5 mg total) by mouth every 6 (six) hours as needed for nausea or vomiting. 20 tablet 0  . triamterene-hydrochlorothiazide (DYAZIDE) 37.5-25 MG capsule Take by mouth.    .  vitamin B-12 (CYANOCOBALAMIN) 500 MCG tablet Take 500 mcg by mouth daily.    . vitamin E 400 UNIT capsule Take by mouth.    . gabapentin (NEURONTIN) 100 MG capsule     . ondansetron (ZOFRAN ODT) 4 MG disintegrating tablet Take 1 tablet (4 mg total) by mouth every 8 (eight) hours as needed for nausea or vomiting. (Patient not taking: Reported on 04/28/2016) 20 tablet 0   No current facility-administered medications for this visit.    Facility-Administered Medications Ordered in Other Visits  Medication Dose Route Frequency Provider Last Rate Last Dose  . heparin lock flush 100 unit/mL  500 Units Intravenous Once Lequita Asal, MD        Review of Systems:  GENERAL:  Feels a little better.  Minimally active.  No fever since 4 days.  Weight up 4 pounds. PERFORMANCE STATUS (ECOG):  2-3 HEENT:  Deaf.  No visual changes, runny nose, sore throat, mouth sores or tenderness. Lungs: No shortness of breath or cough.  No hemoptysis. Cardiac:  No chest pain, palpitations, orthopnea, or PND. GI:  No nausea, vomiting, diarrhea, constipation, melena or hematochezia. GU:  No urgency, frequency, dysuria, or hematuria. Musculoskeletal:  Bilateral leg pain (left > right).  Pain in knees.  No back pain.  No muscle tenderness. Extremities:  No pain or swelling. Skin:  No rashes or skin changes. Neuro:  No headache, numbness or weakness, balance or coordination issues. Endocrine:  No diabetes, thyroid issues, hot flashes or night sweats. Psych:  No mood changes, depression or anxiety. Pain:  No focal pain. Review of systems:  All other systems reviewed and found to be negative.  Physical Exam: Blood pressure 117/73, pulse 81, temperature 98.5 F (36.9 C), temperature source Tympanic, resp. rate 18, weight 147 lb (66.7 kg). GENERAL:  Elderly sitting comfortably in a wheelchair in the exam room in no acute distress.  Slow gait.  She needs assistance onto the table. MENTAL STATUS:  Alert and oriented to  person, place and time. HEAD:  Wearing a head wrap with short gray hairs protruding.  Normocephalic, atraumatic, face symmetric, no Cushingoid features. EYES:  Hazel eyes.  Pupils equal round and reactive to light and accomodation.  No conjunctivitis or scleral icterus. ENT:  Oropharynx clear without lesion.  Upper dentures.  Tongue normal. Mucous membranes moist.  RESPIRATORY:  Clear to auscultation without rales, wheezes or rhonchi. CARDIOVASCULAR:  Regular rate and rhythm without murmur, rub or gallop. ABDOMEN:  Soft, non-tender, with active bowel sounds, and no appreciable hepatosplenomegaly.  No masses. SKIN:  No rashes, ulcers or lesions. EXTREMITIES: No edema, no skin discoloration or tenderness.  No palpable cords. LYMPH NODES: No palpable cervical, supraclavicular, axillary  or inguinal adenopathy  NEUROLOGICAL:  Appropriate. PSYCH:  Appropriate.   Infusion on 04/28/2016  Component Date Value Ref Range Status  . WBC 04/28/2016 10.1  3.6 - 11.0 K/uL Final  . RBC 04/28/2016 2.65* 3.80 - 5.20 MIL/uL Final  . Hemoglobin 04/28/2016 8.0* 12.0 - 16.0 g/dL Final  . HCT 04/28/2016 23.3* 35.0 - 47.0 % Final  . MCV 04/28/2016 88.0  80.0 - 100.0 fL Final  . MCH 04/28/2016 30.2  26.0 - 34.0 pg Final  . MCHC 04/28/2016 34.3  32.0 - 36.0 g/dL Final  . RDW 04/28/2016 16.0* 11.5 - 14.5 % Final  . Platelets 04/28/2016 408  150 - 440 K/uL Final  . Neutrophils Relative % 04/28/2016 67  % Final  . Neutro Abs 04/28/2016 6.8* 1.4 - 6.5 K/uL Final  . Lymphocytes Relative 04/28/2016 18  % Final  . Lymphs Abs 04/28/2016 1.8  1.0 - 3.6 K/uL Final  . Monocytes Relative 04/28/2016 13  % Final  . Monocytes Absolute 04/28/2016 1.3* 0.2 - 0.9 K/uL Final  . Eosinophils Relative 04/28/2016 1  % Final  . Eosinophils Absolute 04/28/2016 0.1  0 - 0.7 K/uL Final  . Basophils Relative 04/28/2016 1  % Final  . Basophils Absolute 04/28/2016 0.1  0 - 0.1 K/uL Final  . Sodium 04/28/2016 131* 135 - 145 mmol/L Final   . Potassium 04/28/2016 3.8  3.5 - 5.1 mmol/L Final  . Chloride 04/28/2016 104  101 - 111 mmol/L Final  . CO2 04/28/2016 22  22 - 32 mmol/L Final  . Glucose, Bld 04/28/2016 108* 65 - 99 mg/dL Final  . BUN 04/28/2016 17  6 - 20 mg/dL Final  . Creatinine, Ser 04/28/2016 1.05* 0.44 - 1.00 mg/dL Final  . Calcium 04/28/2016 8.0* 8.9 - 10.3 mg/dL Final  . Total Protein 04/28/2016 5.7* 6.5 - 8.1 g/dL Final  . Albumin 04/28/2016 2.5* 3.5 - 5.0 g/dL Final  . AST 04/28/2016 31  15 - 41 U/L Final  . ALT 04/28/2016 33  14 - 54 U/L Final  . Alkaline Phosphatase 04/28/2016 94  38 - 126 U/L Final  . Total Bilirubin 04/28/2016 0.8  0.3 - 1.2 mg/dL Final  . GFR calc non Af Amer 04/28/2016 51* >60 mL/min Final  . GFR calc Af Amer 04/28/2016 60* >60 mL/min Final   Comment: (NOTE) The eGFR has been calculated using the CKD EPI equation. This calculation has not been validated in all clinical situations. eGFR's persistently <60 mL/min signify possible Chronic Kidney Disease.   . Anion gap 04/28/2016 5  5 - 15 Final    Assessment:  Kamaryn Grimley is a 75 y.o. female with stage IVB B-cell lymphoma, unclassifiable, with features intermediate between diffuse large B-cell lymphoma and classical Hodgkin's lymphoma ("gray zone" lymphoma).  She underwent right iliac wing bone/bone marrow biopsy on 03/08/2016.  Chest, abdomen and pelvic CT scan without contrast on 02/20/2016 revealed a 3.7 cm subpleural mass within the left lower lobe, a 1.1 cm right lower lobe subpleural nodule , 9 mm RUL nodule, and 0.4 cm lingular nodule.  Between the left second, third, and fourth ribs there was 1.9 cm soft tissue mass. There was a 2.5 cm low-attenuation nodule in the left lobe of the thyroid.   There were prominent left axillary nodes (up to 1.0 cm).   There was no definite mediastinal adenopathy. There was a 5.5 x 4.6 cm cystic lesion in the left hepatic lobe.  There was a 3.1 cm nodule in the  right adrenal gland. There was a 1.2  cm right retrocaval lymph node with additional prominent subcentimeter retroperitoneal nodes. There was 1.0 cm left common iliac node. There was a 6.2 x 5.2 cm soft tissue mass within the right ilium.  Head MRI on 02/21/2016 revealed no evidence of malignancy. 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   There was activity in the superior left ocular orbit.  There was a large lesion centered in the right iliac wing with soft tissue extension.  There were hypermetabolic right external iliac lymph nodes  She was admitted to [facility] in [facility].  She had renal failure and hypercalcemia. She was treated with IVF, calcitonin and Zometa.The patient has a history of coronary artery disease s/p CABG in 1993.  She has no history of heart failure.  She is deaf and requires an interpreter.  Echo on 04/04/2016 revealed an EF of 55-60%.  Hepatitis B and C testing was negative on 03/30/2016.   G6PD assay was normal.  She is s/p 1 cycle of mini-RCHOP (04/07/2016).  She tolerated it well.  Counts were good.  Symptomatically, she is fatigued.  She had an isolated fever yesterday possibly secondary to tumor fever.  Exam is stable.  Plan: 1.  Labs today:  CBC with diff, CMP, LDH, uric acid. 2.  Cycle #2 mini-RCHOP today. 3.  Remind patient to take prednisone for 5 days. 4.  Continue allopurinol. 5.  Discuss patient's thoughts about increasing chemotherapy dosing slightly (10%).  Patient felt uncomfortable with any increases in dose.  Discus readdrressing at next visit. 6.  Discuss patient's thoughts about LP + intrathecal MTX for prophylaxis.  Patient not interested.  Discuss readdressing at next visit. 7.  Discuss borderline anemia needing  transfusion.  Patent feels fine.  Discuss recheck next week and possible transfusion. 8.  Discuss recheck UA and culture given contaminated specimen from ER. 9.  Decrease potassium to 1 pill a day. 10.  Discuss plan for Xgeva at next visit (nurse discussed). 11.  RTC on 10/04 for labs (CBC, type and hold, potassium). 12.  RTC on 10/05 for possible PRBC transfusion. 13.  RTC in 3 weeks for MD assessment, labs (CBC with diff, CMP, LDH, uric acid), Xgeva, cycle #3 mini-RCHOP.   Lequita Asal, MD  04/28/2016, 10:55 AM

## 2016-04-29 ENCOUNTER — Other Ambulatory Visit: Payer: Self-pay | Admitting: Hematology and Oncology

## 2016-04-29 DIAGNOSIS — E876 Hypokalemia: Secondary | ICD-10-CM | POA: Insufficient documentation

## 2016-04-29 LAB — URINE CULTURE: Culture: NO GROWTH

## 2016-05-03 ENCOUNTER — Encounter (INDEPENDENT_AMBULATORY_CARE_PROVIDER_SITE_OTHER): Payer: Self-pay

## 2016-05-03 ENCOUNTER — Inpatient Hospital Stay: Payer: Medicare HMO | Attending: Hematology and Oncology

## 2016-05-03 ENCOUNTER — Telehealth: Payer: Self-pay | Admitting: *Deleted

## 2016-05-03 DIAGNOSIS — R630 Anorexia: Secondary | ICD-10-CM | POA: Insufficient documentation

## 2016-05-03 DIAGNOSIS — R200 Anesthesia of skin: Secondary | ICD-10-CM | POA: Insufficient documentation

## 2016-05-03 DIAGNOSIS — I2510 Atherosclerotic heart disease of native coronary artery without angina pectoris: Secondary | ICD-10-CM | POA: Diagnosis not present

## 2016-05-03 DIAGNOSIS — Z5112 Encounter for antineoplastic immunotherapy: Secondary | ICD-10-CM | POA: Diagnosis not present

## 2016-05-03 DIAGNOSIS — D649 Anemia, unspecified: Secondary | ICD-10-CM | POA: Insufficient documentation

## 2016-05-03 DIAGNOSIS — C8580 Other specified types of non-Hodgkin lymphoma, unspecified site: Secondary | ICD-10-CM

## 2016-05-03 DIAGNOSIS — C8338 Diffuse large B-cell lymphoma, lymph nodes of multiple sites: Secondary | ICD-10-CM | POA: Insufficient documentation

## 2016-05-03 DIAGNOSIS — M129 Arthropathy, unspecified: Secondary | ICD-10-CM | POA: Insufficient documentation

## 2016-05-03 DIAGNOSIS — H9190 Unspecified hearing loss, unspecified ear: Secondary | ICD-10-CM | POA: Insufficient documentation

## 2016-05-03 DIAGNOSIS — M79606 Pain in leg, unspecified: Secondary | ICD-10-CM | POA: Insufficient documentation

## 2016-05-03 DIAGNOSIS — Z87891 Personal history of nicotine dependence: Secondary | ICD-10-CM | POA: Insufficient documentation

## 2016-05-03 DIAGNOSIS — Z5111 Encounter for antineoplastic chemotherapy: Secondary | ICD-10-CM | POA: Diagnosis present

## 2016-05-03 DIAGNOSIS — E041 Nontoxic single thyroid nodule: Secondary | ICD-10-CM | POA: Insufficient documentation

## 2016-05-03 DIAGNOSIS — R42 Dizziness and giddiness: Secondary | ICD-10-CM | POA: Insufficient documentation

## 2016-05-03 DIAGNOSIS — Z79899 Other long term (current) drug therapy: Secondary | ICD-10-CM | POA: Insufficient documentation

## 2016-05-03 DIAGNOSIS — F419 Anxiety disorder, unspecified: Secondary | ICD-10-CM | POA: Insufficient documentation

## 2016-05-03 DIAGNOSIS — I10 Essential (primary) hypertension: Secondary | ICD-10-CM | POA: Insufficient documentation

## 2016-05-03 DIAGNOSIS — Z801 Family history of malignant neoplasm of trachea, bronchus and lung: Secondary | ICD-10-CM | POA: Insufficient documentation

## 2016-05-03 DIAGNOSIS — C8330 Diffuse large B-cell lymphoma, unspecified site: Secondary | ICD-10-CM

## 2016-05-03 LAB — CBC WITH DIFFERENTIAL/PLATELET
Basophils Absolute: 0 10*3/uL (ref 0–0.1)
Basophils Relative: 0 %
Eosinophils Absolute: 0 10*3/uL (ref 0–0.7)
Eosinophils Relative: 0 %
HCT: 26.3 % — ABNORMAL LOW (ref 35.0–47.0)
Hemoglobin: 9 g/dL — ABNORMAL LOW (ref 12.0–16.0)
Lymphocytes Relative: 21 %
Lymphs Abs: 1.9 10*3/uL (ref 1.0–3.6)
MCH: 29.9 pg (ref 26.0–34.0)
MCHC: 34.1 g/dL (ref 32.0–36.0)
MCV: 87.6 fL (ref 80.0–100.0)
Monocytes Absolute: 0.2 10*3/uL (ref 0.2–0.9)
Monocytes Relative: 2 %
Neutro Abs: 7 10*3/uL — ABNORMAL HIGH (ref 1.4–6.5)
Neutrophils Relative %: 77 %
Platelets: 530 10*3/uL — ABNORMAL HIGH (ref 150–440)
RBC: 3.01 MIL/uL — ABNORMAL LOW (ref 3.80–5.20)
RDW: 16 % — ABNORMAL HIGH (ref 11.5–14.5)
WBC: 9.1 10*3/uL (ref 3.6–11.0)

## 2016-05-03 LAB — SAMPLE TO BLOOD BANK

## 2016-05-03 NOTE — Telephone Encounter (Addendum)
Called patient's daughter, [name] and LVM that patient's HGB is 9.0  Patient will not need blood transfusion tomorrow. [facility] appointment has been cancelled.

## 2016-05-04 ENCOUNTER — Other Ambulatory Visit: Payer: Self-pay | Admitting: *Deleted

## 2016-05-04 ENCOUNTER — Inpatient Hospital Stay: Payer: Medicare HMO

## 2016-05-04 MED ORDER — POTASSIUM CHLORIDE ER 10 MEQ PO TBCR: 10.0000 meq | EXTENDED_RELEASE_TABLET | Freq: Every day | ORAL | 0 refills | Status: DC

## 2016-05-04 NOTE — Telephone Encounter (Signed)
    Ref Range & Units 6d ago 11d ago   Sodium 135 - 145 mmol/L 131   132     Potassium 3.5 - 5.1 mmol/L 3.8  2.9

## 2016-05-15 ENCOUNTER — Inpatient Hospital Stay
Admission: RE | Admit: 2016-05-15 | Discharge: 2016-05-15 | Disposition: A | Discharge status: Home / Self Care | Payer: Self-pay | Source: Ambulatory Visit | Attending: Hematology and Oncology | Admitting: Hematology and Oncology

## 2016-05-15 ENCOUNTER — Other Ambulatory Visit: Payer: Self-pay | Admitting: Hematology and Oncology

## 2016-05-15 DIAGNOSIS — C3490 Malignant neoplasm of unspecified part of unspecified bronchus or lung: Secondary | ICD-10-CM

## 2016-05-19 ENCOUNTER — Inpatient Hospital Stay: Payer: Medicare HMO

## 2016-05-19 ENCOUNTER — Other Ambulatory Visit: Payer: Self-pay | Admitting: Hematology and Oncology

## 2016-05-19 ENCOUNTER — Inpatient Hospital Stay (HOSPITAL_BASED_OUTPATIENT_CLINIC_OR_DEPARTMENT_OTHER): Payer: Medicare HMO | Admitting: Internal Medicine

## 2016-05-19 ENCOUNTER — Inpatient Hospital Stay (HOSPITAL_BASED_OUTPATIENT_CLINIC_OR_DEPARTMENT_OTHER): Payer: Medicare HMO

## 2016-05-19 VITALS — BP 131/74 | HR 90 | Temp 98.2°F | Resp 18 | Wt 147.0 lb

## 2016-05-19 DIAGNOSIS — I2510 Atherosclerotic heart disease of native coronary artery without angina pectoris: Secondary | ICD-10-CM

## 2016-05-19 DIAGNOSIS — E041 Nontoxic single thyroid nodule: Secondary | ICD-10-CM | POA: Diagnosis not present

## 2016-05-19 DIAGNOSIS — I10 Essential (primary) hypertension: Secondary | ICD-10-CM

## 2016-05-19 DIAGNOSIS — M79606 Pain in leg, unspecified: Secondary | ICD-10-CM | POA: Diagnosis not present

## 2016-05-19 DIAGNOSIS — Z801 Family history of malignant neoplasm of trachea, bronchus and lung: Secondary | ICD-10-CM

## 2016-05-19 DIAGNOSIS — D649 Anemia, unspecified: Secondary | ICD-10-CM

## 2016-05-19 DIAGNOSIS — C8330 Diffuse large B-cell lymphoma, unspecified site: Secondary | ICD-10-CM

## 2016-05-19 DIAGNOSIS — C8338 Diffuse large B-cell lymphoma, lymph nodes of multiple sites: Secondary | ICD-10-CM | POA: Diagnosis not present

## 2016-05-19 DIAGNOSIS — R200 Anesthesia of skin: Secondary | ICD-10-CM

## 2016-05-19 DIAGNOSIS — Z5111 Encounter for antineoplastic chemotherapy: Secondary | ICD-10-CM | POA: Diagnosis not present

## 2016-05-19 DIAGNOSIS — F419 Anxiety disorder, unspecified: Secondary | ICD-10-CM

## 2016-05-19 DIAGNOSIS — Z87891 Personal history of nicotine dependence: Secondary | ICD-10-CM

## 2016-05-19 DIAGNOSIS — C8580 Other specified types of non-Hodgkin lymphoma, unspecified site: Secondary | ICD-10-CM

## 2016-05-19 DIAGNOSIS — C801 Malignant (primary) neoplasm, unspecified: Secondary | ICD-10-CM

## 2016-05-19 DIAGNOSIS — R630 Anorexia: Secondary | ICD-10-CM

## 2016-05-19 DIAGNOSIS — H9190 Unspecified hearing loss, unspecified ear: Secondary | ICD-10-CM

## 2016-05-19 DIAGNOSIS — C8198 Hodgkin lymphoma, unspecified, lymph nodes of multiple sites: Secondary | ICD-10-CM

## 2016-05-19 DIAGNOSIS — Z79899 Other long term (current) drug therapy: Secondary | ICD-10-CM

## 2016-05-19 DIAGNOSIS — M129 Arthropathy, unspecified: Secondary | ICD-10-CM

## 2016-05-19 LAB — COMPREHENSIVE METABOLIC PANEL
ALT: 27 U/L (ref 14–54)
AST: 38 U/L (ref 15–41)
Albumin: 2.7 g/dL — ABNORMAL LOW (ref 3.5–5.0)
Alkaline Phosphatase: 97 U/L (ref 38–126)
Anion gap: 9 (ref 5–15)
BUN: 22 mg/dL — ABNORMAL HIGH (ref 6–20)
CO2: 22 mmol/L (ref 22–32)
Calcium: 8.3 mg/dL — ABNORMAL LOW (ref 8.9–10.3)
Chloride: 100 mmol/L — ABNORMAL LOW (ref 101–111)
Creatinine, Ser: 0.93 mg/dL (ref 0.44–1.00)
GFR calc Af Amer: >60 mL/min (ref >60)
GFR calc non Af Amer: 60 mL/min — ABNORMAL LOW (ref >60)
Glucose, Bld: 138 mg/dL — ABNORMAL HIGH (ref 65–99)
Potassium: 3.4 mmol/L — ABNORMAL LOW (ref 3.5–5.1)
Sodium: 131 mmol/L — ABNORMAL LOW (ref 135–145)
Total Bilirubin: 1 mg/dL (ref 0.3–1.2)
Total Protein: 6.2 g/dL — ABNORMAL LOW (ref 6.5–8.1)

## 2016-05-19 LAB — CBC WITH DIFFERENTIAL/PLATELET
Basophils Absolute: 0.1 10*3/uL (ref 0–0.1)
Basophils Relative: 1 %
Eosinophils Absolute: 0 10*3/uL (ref 0–0.7)
Eosinophils Relative: 0 %
HCT: 25.2 % — ABNORMAL LOW (ref 35.0–47.0)
Hemoglobin: 8.7 g/dL — ABNORMAL LOW (ref 12.0–16.0)
Lymphocytes Relative: 16 %
Lymphs Abs: 1.7 10*3/uL (ref 1.0–3.6)
MCH: 30.3 pg (ref 26.0–34.0)
MCHC: 34.7 g/dL (ref 32.0–36.0)
MCV: 87.1 fL (ref 80.0–100.0)
Monocytes Absolute: 1.9 10*3/uL — ABNORMAL HIGH (ref 0.2–0.9)
Monocytes Relative: 18 %
Neutro Abs: 6.9 10*3/uL — ABNORMAL HIGH (ref 1.4–6.5)
Neutrophils Relative %: 65 %
Platelets: 405 10*3/uL (ref 150–440)
RBC: 2.89 MIL/uL — ABNORMAL LOW (ref 3.80–5.20)
RDW: 18.5 % — ABNORMAL HIGH (ref 11.5–14.5)
WBC: 10.7 10*3/uL (ref 3.6–11.0)

## 2016-05-19 LAB — URIC ACID: Uric Acid, Serum: 3.2 mg/dL (ref 2.3–6.6)

## 2016-05-19 LAB — LACTATE DEHYDROGENASE: LDH: 113 U/L (ref 98–192)

## 2016-05-19 MED ORDER — VINCRISTINE SULFATE CHEMO INJECTION 1 MG/ML
1.0000 mg | Freq: Once | INTRAVENOUS | Status: AC
  Administered 2016-05-19: 1 mg via INTRAVENOUS
  Filled 2016-05-19: qty 1

## 2016-05-19 MED ORDER — SODIUM CHLORIDE 0.9 % IV SOLN
375.0000 mg/m2 | Freq: Once | INTRAVENOUS | Status: AC
  Administered 2016-05-19: 600 mg via INTRAVENOUS
  Filled 2016-05-19: qty 50

## 2016-05-19 MED ORDER — DIPHENHYDRAMINE HCL 25 MG PO CAPS
50.0000 mg | ORAL_CAPSULE | Freq: Once | ORAL | Status: AC
  Administered 2016-05-19: 50 mg via ORAL
  Filled 2016-05-19: qty 2

## 2016-05-19 MED ORDER — DEXAMETHASONE SODIUM PHOSPHATE 10 MG/ML IJ SOLN
10.0000 mg | Freq: Once | INTRAMUSCULAR | Status: AC
  Administered 2016-05-19: 10 mg via INTRAVENOUS
  Filled 2016-05-19: qty 1

## 2016-05-19 MED ORDER — DOXORUBICIN HCL CHEMO IV INJECTION 2 MG/ML
25.0000 mg/m2 | Freq: Once | INTRAVENOUS | Status: AC
  Administered 2016-05-19: 42 mg via INTRAVENOUS
  Filled 2016-05-19: qty 21

## 2016-05-19 MED ORDER — SODIUM CHLORIDE 0.9 % IV SOLN: 375.0000 mg/m2 | Freq: Once | INTRAVENOUS | Status: DC

## 2016-05-19 MED ORDER — SODIUM CHLORIDE 0.9 % IJ SOLN
10.0000 mL | Freq: Once | INTRAMUSCULAR | Status: AC
  Administered 2016-05-19: 10 mL via INTRAVENOUS
  Filled 2016-05-19: qty 10

## 2016-05-19 MED ORDER — POTASSIUM CHLORIDE ER 10 MEQ PO TBCR: 10.0000 meq | EXTENDED_RELEASE_TABLET | Freq: Every day | ORAL | 0 refills | Status: DC

## 2016-05-19 MED ORDER — PALONOSETRON HCL INJECTION 0.25 MG/5ML
0.2500 mg | Freq: Once | INTRAVENOUS | Status: AC
  Administered 2016-05-19: 0.25 mg via INTRAVENOUS
  Filled 2016-05-19: qty 5

## 2016-05-19 MED ORDER — HEPARIN SOD (PORK) LOCK FLUSH 100 UNIT/ML IV SOLN
500.0000 [IU] | Freq: Once | INTRAVENOUS | Status: AC
  Administered 2016-05-19: 500 [IU] via INTRAVENOUS
  Filled 2016-05-19: qty 5

## 2016-05-19 MED ORDER — SODIUM CHLORIDE 0.9 % IV SOLN: 10.0000 mg | Freq: Once | INTRAVENOUS | Status: DC

## 2016-05-19 MED ORDER — ACETAMINOPHEN 325 MG PO TABS
650.0000 mg | ORAL_TABLET | Freq: Once | ORAL | Status: AC
  Administered 2016-05-19: 650 mg via ORAL
  Filled 2016-05-19: qty 2

## 2016-05-19 MED ORDER — SODIUM CHLORIDE 0.9 % IV SOLN
400.0000 mg/m2 | Freq: Once | INTRAVENOUS | Status: AC
  Administered 2016-05-19: 680 mg via INTRAVENOUS
  Filled 2016-05-19: qty 34

## 2016-05-19 MED ORDER — SODIUM CHLORIDE 0.9 % IV SOLN
Freq: Once | INTRAVENOUS | Status: AC
  Administered 2016-05-19: 11:00:00 via INTRAVENOUS
  Filled 2016-05-19: qty 1000

## 2016-05-19 NOTE — Progress Notes (Signed)
Patient is here for follow up, has leg pain at night that wakes her up. She does have arthritis

## 2016-05-26 ENCOUNTER — Ambulatory Visit: Payer: Medicare HMO

## 2016-05-26 ENCOUNTER — Inpatient Hospital Stay: Payer: Medicare HMO

## 2016-05-26 ENCOUNTER — Inpatient Hospital Stay (HOSPITAL_BASED_OUTPATIENT_CLINIC_OR_DEPARTMENT_OTHER): Payer: Medicare HMO | Admitting: Hematology and Oncology

## 2016-05-26 VITALS — BP 102/60 | HR 95 | Temp 96.7°F | Resp 18 | Wt 151.5 lb

## 2016-05-26 DIAGNOSIS — M129 Arthropathy, unspecified: Secondary | ICD-10-CM

## 2016-05-26 DIAGNOSIS — C8338 Diffuse large B-cell lymphoma, lymph nodes of multiple sites: Secondary | ICD-10-CM

## 2016-05-26 DIAGNOSIS — R630 Anorexia: Secondary | ICD-10-CM

## 2016-05-26 DIAGNOSIS — D649 Anemia, unspecified: Secondary | ICD-10-CM | POA: Diagnosis not present

## 2016-05-26 DIAGNOSIS — H9190 Unspecified hearing loss, unspecified ear: Secondary | ICD-10-CM

## 2016-05-26 DIAGNOSIS — M79606 Pain in leg, unspecified: Secondary | ICD-10-CM

## 2016-05-26 DIAGNOSIS — E041 Nontoxic single thyroid nodule: Secondary | ICD-10-CM

## 2016-05-26 DIAGNOSIS — R42 Dizziness and giddiness: Secondary | ICD-10-CM

## 2016-05-26 DIAGNOSIS — R200 Anesthesia of skin: Secondary | ICD-10-CM

## 2016-05-26 DIAGNOSIS — F419 Anxiety disorder, unspecified: Secondary | ICD-10-CM

## 2016-05-26 DIAGNOSIS — D6481 Anemia due to antineoplastic chemotherapy: Secondary | ICD-10-CM

## 2016-05-26 DIAGNOSIS — I10 Essential (primary) hypertension: Secondary | ICD-10-CM

## 2016-05-26 DIAGNOSIS — I2510 Atherosclerotic heart disease of native coronary artery without angina pectoris: Secondary | ICD-10-CM

## 2016-05-26 DIAGNOSIS — C8580 Other specified types of non-Hodgkin lymphoma, unspecified site: Principal | ICD-10-CM

## 2016-05-26 DIAGNOSIS — C7951 Secondary malignant neoplasm of bone: Secondary | ICD-10-CM

## 2016-05-26 DIAGNOSIS — C8198 Hodgkin lymphoma, unspecified, lymph nodes of multiple sites: Secondary | ICD-10-CM

## 2016-05-26 DIAGNOSIS — C8330 Diffuse large B-cell lymphoma, unspecified site: Secondary | ICD-10-CM

## 2016-05-26 DIAGNOSIS — Z87891 Personal history of nicotine dependence: Secondary | ICD-10-CM

## 2016-05-26 DIAGNOSIS — Z801 Family history of malignant neoplasm of trachea, bronchus and lung: Secondary | ICD-10-CM

## 2016-05-26 DIAGNOSIS — Z79899 Other long term (current) drug therapy: Secondary | ICD-10-CM

## 2016-05-26 DIAGNOSIS — T451X5A Adverse effect of antineoplastic and immunosuppressive drugs, initial encounter: Secondary | ICD-10-CM

## 2016-05-26 DIAGNOSIS — E876 Hypokalemia: Secondary | ICD-10-CM

## 2016-05-26 DIAGNOSIS — Z5111 Encounter for antineoplastic chemotherapy: Secondary | ICD-10-CM | POA: Diagnosis not present

## 2016-05-26 LAB — CBC WITH DIFFERENTIAL/PLATELET
BASOS ABS: 0.1 10*3/uL (ref 0–0.1)
BASOS PCT: 1 %
EOS PCT: 3 %
Eosinophils Absolute: 0.3 10*3/uL (ref 0–0.7)
HCT: 27.2 % — ABNORMAL LOW (ref 35.0–47.0)
Hemoglobin: 9.3 g/dL — ABNORMAL LOW (ref 12.0–16.0)
Lymphocytes Relative: 23 %
Lymphs Abs: 2.1 10*3/uL (ref 1.0–3.6)
MCH: 29.7 pg (ref 26.0–34.0)
MCHC: 34.2 g/dL (ref 32.0–36.0)
MCV: 86.7 fL (ref 80.0–100.0)
MONO ABS: 0.1 10*3/uL — AB (ref 0.2–0.9)
Monocytes Relative: 1 %
NEUTROS ABS: 6.5 10*3/uL (ref 1.4–6.5)
Neutrophils Relative %: 72 %
PLATELETS: 454 10*3/uL — AB (ref 150–440)
RBC: 3.14 MIL/uL — AB (ref 3.80–5.20)
RDW: 18.4 % — AB (ref 11.5–14.5)
WBC: 9.2 10*3/uL (ref 3.6–11.0)

## 2016-05-26 LAB — SAMPLE TO BLOOD BANK

## 2016-05-26 NOTE — Progress Notes (Signed)
.[facility] day:  05/26/2016   Chief Complaint: Toni Griffith is a 75 y.o. female with a gray zone lymphoma who is seen for assessment on day 8 of cycle #3 mini-RCHOP.  HPI:  The patient was last seen in the medical oncology clinic by me on 04/28/2016.  At that time, she was feeling a little bit better than prior to her first cycle of chemotherapy. She had some leg pain and arthritis.  She was seen by Dr. Bangladesh on 05/19/2016.  She received cycle #3.  During the interim, she notes that her legs have felt stiff and not comfortable.  She denies any foot drop.  She feels a little lightheaded this morning. Specifically, she notes dizziness on standing. Her oral intake in the morning is poor. She has had some lip and gum sensitivity.  She has had a little bit of numbness in the tips of her fingers.  She denies any nausea or vomiting.   Past Medical History:  Diagnosis Date  . Arthritis   . Cancer ([place])   . Hypertension     Past Surgical History:  Procedure Laterality Date  . CARDIAC SURGERY  1993  . CYST REMOVAL HAND    . PERIPHERAL VASCULAR CATHETERIZATION N/A 04/06/2016   Procedure: Glori Luis Cath Insertion;  Surgeon: Algernon Huxley, MD;  Location: [facility];  Service: Cardiovascular;  Laterality: N/A;    Family History  Problem Relation Age of Onset  . Lung cancer Mother   . Lung cancer Father   . Diabetes Maternal Uncle   . Myasthenia gravis Maternal Grandmother   . Leukemia Maternal Grandfather     Social History:  reports that she has quit smoking. She has never used smokeless tobacco. She reports that she drinks alcohol. She reports that she does not use drugs.  She smoked 1 1/2 packs per day for 20 years (30 pack year smoking history) until 3 months ago.  She is hearing impaired (deaf).  She lives in [place] with her boyfriend.  Her daughter lives in [place]. The patient is accompanied by her son [name]) and the interpreter (sign  language) today.  Allergies: No Known Allergies  Current Medications: Current Outpatient Prescriptions  Medication Sig Dispense Refill  . acetaminophen (TYLENOL) 325 MG tablet Take 650 mg by mouth.    Marland Kitchen allopurinol (ZYLOPRIM) 300 MG tablet Take 1 tablet (300 mg total) by mouth daily. 30 tablet 3  . atorvastatin (LIPITOR) 80 MG tablet Take 80 mg by mouth.    . B Complex Vitamins (B COMPLEX 1 PO) Take by mouth.    Marland Kitchen FLUoxetine (PROZAC) 40 MG capsule Take 40 mg by mouth.    . gabapentin (NEURONTIN) 100 MG capsule     . Garlic 1884 MG CAPS Take by mouth.    . lidocaine-prilocaine (EMLA) cream Apply cream 1 hour before chemotherapy treatment and place small peive of saran wrap over cream to protect clothing 30 g 1  . naproxen sodium (ANAPROX) 220 MG tablet Take 220 mg by mouth as needed.    . ondansetron (ZOFRAN ODT) 4 MG disintegrating tablet Take 1 tablet (4 mg total) by mouth every 8 (eight) hours as needed for nausea or vomiting. 20 tablet 0  . ondansetron (ZOFRAN) 8 MG tablet Take 1 tablet (8 mg total) by mouth 2 (two) times daily as needed for refractory nausea / vomiting. Start on day 3 after cyclophosphamide chemotherapy. 30 tablet 1  . pantoprazole (  PROTONIX) 40 MG tablet Take 40 mg by mouth.    . potassium chloride (K-DUR) 10 MEQ tablet Take 1 tablet (10 mEq total) by mouth daily. 30 tablet 0  . predniSONE (DELTASONE) 20 MG tablet Take 3.5 tablets (70 mg total) by mouth daily with breakfast. Take on days 1-5 of chemotherapy. 20 tablet 5  . promethazine (PHENERGAN) 12.5 MG tablet Take 1 tablet (12.5 mg total) by mouth every 6 (six) hours as needed for nausea or vomiting. 20 tablet 0  . triamterene-hydrochlorothiazide (DYAZIDE) 37.5-25 MG capsule Take by mouth.    . vitamin B-12 (CYANOCOBALAMIN) 500 MCG tablet Take 500 mcg by mouth daily.    . vitamin E 400 UNIT capsule Take by mouth.     No current facility-administered medications for this visit.     Review of Systems:  GENERAL:   Feels"ok".  No fever or chills.  Weight up 4 pounds. PERFORMANCE STATUS (ECOG):  2-3 HEENT:  Deaf.  Lips and gum sore during the interim (resolved).  No visual changes, runny nose, sore throat, mouth sores or tenderness. Lungs: No shortness of breath or cough.  No hemoptysis. Cardiac:  No chest pain, palpitations, orthopnea, or PND.  Little light headed when standing up. GI:  No nausea, vomiting, diarrhea, constipation, melena or hematochezia. GU:  No urgency, frequency, dysuria, or hematuria. Musculoskeletal: Legs feel stiff.  No back pain.  No muscle tenderness. Extremities:  No pain or swelling. Skin:  No rashes or skin changes. Neuro:  Fingertips numb (transient).  No headache, numbness or weakness, balance or coordination issues. Endocrine:  No diabetes, thyroid issues, hot flashes or night sweats. Psych:  Feels anxious when standing up.  No mood changes, depression or anxiety. Pain:  No focal pain. Review of systems:  All other systems reviewed and found to be negative.  Physical Exam: Blood pressure 102/68, pulse 78, temperature (!) 96.7 F (35.9 C), temperature source Tympanic, resp. rate 18, weight 151 lb 7.3 oz (68.7 kg). GENERAL:  Elderly sitting comfortably in a wheelchair in the exam room in no acute distress.  Slow gait.  She needs assistance onto the table. MENTAL STATUS:  Alert and oriented to person, place and time. HEAD:  Normocephalic, atraumatic, face symmetric, no Cushingoid features. EYES:  Hazel eyes.  Pupils equal round and reactive to light and accomodation.  No conjunctivitis or scleral icterus. ENT:  Oropharynx clear without lesion.  Upper dentures.  Tongue normal. Mucous membranes moist.  RESPIRATORY:  Clear to auscultation without rales, wheezes or rhonchi. CARDIOVASCULAR:  Regular rate and rhythm without murmur, rub or gallop. ABDOMEN:  Soft, non-tender, with active bowel sounds, and no appreciable hepatosplenomegaly.  No masses. SKIN:  No rashes, ulcers or  lesions. EXTREMITIES: No edema, no skin discoloration or tenderness.  No palpable cords. LYMPH NODES: No palpable cervical, supraclavicular, axillary or inguinal adenopathy  NEUROLOGICAL:  Lower extremity strength and sensation ? slightly decreased in right leg.  Bilateral patellar reflexes symmetric.  No foot drop (able to stand on heels). PSYCH:  Appropriate.   Appointment on 05/26/2016  Component Date Value Ref Range Status  . WBC 05/26/2016 9.2  3.6 - 11.0 K/uL Final  . RBC 05/26/2016 3.14* 3.80 - 5.20 MIL/uL Final  . Hemoglobin 05/26/2016 9.3* 12.0 - 16.0 g/dL Final  . HCT 05/26/2016 27.2* 35.0 - 47.0 % Final  . MCV 05/26/2016 86.7  80.0 - 100.0 fL Final  . MCH 05/26/2016 29.7  26.0 - 34.0 pg Final  . MCHC 05/26/2016 34.2  32.0 - 36.0 g/dL Final  . RDW 05/26/2016 18.4* 11.5 - 14.5 % Final  . Platelets 05/26/2016 454* 150 - 440 K/uL Final  . Neutrophils Relative % 05/26/2016 72  % Final  . Neutro Abs 05/26/2016 6.5  1.4 - 6.5 K/uL Final  . Lymphocytes Relative 05/26/2016 23  % Final  . Lymphs Abs 05/26/2016 2.1  1.0 - 3.6 K/uL Final  . Monocytes Relative 05/26/2016 1  % Final  . Monocytes Absolute 05/26/2016 0.1* 0.2 - 0.9 K/uL Final  . Eosinophils Relative 05/26/2016 3  % Final  . Eosinophils Absolute 05/26/2016 0.3  0 - 0.7 K/uL Final  . Basophils Relative 05/26/2016 1  % Final  . Basophils Absolute 05/26/2016 0.1  0 - 0.1 K/uL Final    Assessment:  Alaura Schippers is a 75 y.o. female with stage IVB B-cell lymphoma, unclassifiable, with features intermediate between diffuse large B-cell lymphoma and classical Hodgkin's lymphoma ("gray zone" lymphoma).  She underwent right iliac wing bone/bone marrow biopsy on 03/08/2016.  Chest, abdomen and pelvic CT scan without contrast on 02/20/2016 revealed a 3.7 cm subpleural mass within the left lower lobe, a 1.1 cm right lower lobe subpleural nodule , 9 mm RUL nodule, and 0.4 cm lingular nodule.  Between the left second, third, and fourth  ribs there was 1.9 cm soft tissue mass. There was a 2.5 cm low-attenuation nodule in the left lobe of the thyroid.   There were prominent left axillary nodes (up to 1.0 cm).   There was no definite mediastinal adenopathy. There was a 5.5 x 4.6 cm cystic lesion in the left hepatic lobe.  There was a 3.1 cm nodule in the right adrenal gland. There was a 1.2 cm right retrocaval lymph node with additional prominent subcentimeter retroperitoneal nodes. There was 1.0 cm left common iliac node. There was a 6.2 x 5.2 cm soft tissue mass within the right ilium.  Head MRI on 02/21/2016 revealed no evidence of malignancy. 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   There was activity in the superior left ocular orbit.  There was a large lesion centered in the right iliac wing with soft tissue extension.  There were hypermetabolic right external iliac lymph nodes  She was admitted to [facility] in [facility].  She had renal failure and hypercalcemia. She was treated with IVF, calcitonin and Zometa.The patient has a history of coronary artery disease s/p CABG in 1993.  She has no history of heart failure.  She is deaf and requires an interpreter.  Echo on 04/04/2016 revealed an EF of 55-60%.  Hepatitis B and C testing was negative on 03/30/2016.   G6PD assay was normal.  She declined LP with MTX prophylaxis.  She is day 8 s/p cycle #3 mini-RCHOP (04/07/2016 - 05/19/2016).  She has had some interval sensitivity in her gums and lip.  She has a normocytic anemia.  Labs on 04/17/2016 revealed an elevated ferritin (444), iron saturation (11%), low TIBC (240), B12 (2409), and folate (10.8).  Symptomatically, she is dizzy on standing ("feels anxious").  Exam  reveals questionable slight right lower extremity strength and sensation decrease.  Bilateral patellar reflexes are symmetric.  She has no foot drop (able to stand on heels).  Hematocrit is 27.2 (hemoglobin  9.3).  White count and ANC are normal.  Potassium was 3.4 on 05/19/2016.  Plan: 1.  Labs today:  CBC with diff, type and hold. 2.  Discuss blood counts.  Hematocrit has improved. 3.  Discuss patient's thoughts about LP for intrathecal MTX.  Patient declines for the second time. 4.  Discuss patient's thoughts about increasing chemotherapy dose.  Son interested in response to chemotherapy.  Discuss response versus durability of response.  Patient undecided. 5.  Continue allopurinol. 6.  Refill potassium 10 meq po q day. 7.  Discuss plans for Xgeva if calcium adequate. 8.  PET scan for restaging. 9.  RTC on 06/09/2016 for MD assessment, labs (CBC with diff, CMP, LDH, uric acid), review PET, and cycle #4 RCHOP   Lequita Asal, MD  05/26/2016, 9:50 AM

## 2016-05-28 ENCOUNTER — Encounter: Payer: Self-pay | Admitting: Hematology and Oncology

## 2016-05-29 ENCOUNTER — Inpatient Hospital Stay: Payer: Medicare HMO

## 2016-05-30 NOTE — Progress Notes (Signed)
  Cc: f/u for cycle 2 chemo for gray zone lymphoma- B cell lymphoma with features of both Non hodgkin and hodgkin lymphoma.   HPI:  Toni Griffith is a 75 y.o. female with stage IVB B-cell lymphoma, unclassifiable, with features intermediate between diffuse large B-cell lymphoma and classical Hodgkin's lymphoma ("gray zone" lymphoma).    Measurable disease as follows (as reviewed by [name])  Chest, abdomen and pelvic CT scan without contrast on 02/20/2016 revealed a 3.7 cm subpleural mass within the left lower lobe, a 1.1 cm right lower lobe subpleural nodule , 9 mm RUL nodule, and 0.4 cm lingular nodule.  Between the left second, third, and fourth ribs there was 1.9 cm soft tissue mass. There was a 2.5 cm low-attenuation nodule in the left lobe of the thyroid.   There were prominent left axillary nodes (up to 1.0 cm).   There was no definite mediastinal adenopathy. There was a 5.5 x 4.6 cm cystic lesion in the left hepatic lobe.  There was a 3.1 cm nodule in the right adrenal gland. There was a 1.2 cm right retrocaval lymph node with additional prominent subcentimeter retroperitoneal nodes. There was 1.0 cm left common iliac node. There was a 6.2 x 5.2 cm soft tissue mass within the right ilium.  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  There was activity in the superior left ocular orbit.  There was a large lesion centered in the right iliac wing with soft tissue extension.  There were hypermetabolic right external iliac lymph nodes  INTERVAL HISTORY: Obtained thru sign language interpretor  She received  1 cycle of mini-RCHOP on( 04/07/2016 and tolerated well  Doing well, complains of "tightness" aruund the knees, improves with  walking, no pareshesias in extremities, no nausea or vomitng, or SOB, or cough No fever, no diarrhea.  Exam shows her to be comfortable, ambulalting with help, mild pallor, no icterus, no pedal edema  Vitals are stable. Hgb 8.7, wbc 10.7 Uric acid 3.2 Cr 0.93 calcium 8.3 adjusted to albumin is wnl K 3.4 Na 131 ldh 113   Impression and Plan:  Proceed with Cycle 2 mini CHOP -r TODAY- no dose adjustments are needed today. Cbc shows mild anemia, no signs of tumor lysis. Encouraged to drink plenty of fluids by mouth No signs of peripheral neuropathy.  RTC next week for repeat cbc and possible transfusion if hgb drops further.

## 2016-06-06 ENCOUNTER — Ambulatory Visit
Admission: RE | Admit: 2016-06-06 | Discharge: 2016-06-06 | Disposition: A | Discharge status: Home / Self Care | Payer: Medicare HMO | Source: Ambulatory Visit | Attending: Hematology and Oncology | Admitting: Hematology and Oncology

## 2016-06-06 DIAGNOSIS — R911 Solitary pulmonary nodule: Secondary | ICD-10-CM | POA: Insufficient documentation

## 2016-06-06 DIAGNOSIS — C8330 Diffuse large B-cell lymphoma, unspecified site: Secondary | ICD-10-CM

## 2016-06-06 DIAGNOSIS — C8580 Other specified types of non-Hodgkin lymphoma, unspecified site: Secondary | ICD-10-CM

## 2016-06-06 DIAGNOSIS — C8198 Hodgkin lymphoma, unspecified, lymph nodes of multiple sites: Secondary | ICD-10-CM | POA: Insufficient documentation

## 2016-06-06 LAB — GLUCOSE, CAPILLARY: Glucose-Capillary: 87 mg/dL (ref 65–99)

## 2016-06-06 MED ORDER — FLUDEOXYGLUCOSE F - 18 (FDG) INJECTION
12.0000 | Freq: Once | INTRAVENOUS | Status: AC | PRN
  Administered 2016-06-06: 12.33 via INTRAVENOUS

## 2016-06-09 ENCOUNTER — Inpatient Hospital Stay: Payer: Medicare HMO

## 2016-06-09 ENCOUNTER — Other Ambulatory Visit: Payer: Self-pay | Admitting: Hematology and Oncology

## 2016-06-09 ENCOUNTER — Inpatient Hospital Stay: Payer: Medicare HMO | Attending: Hematology and Oncology | Admitting: Hematology and Oncology

## 2016-06-09 VITALS — BP 130/72 | HR 84 | Temp 96.3°F | Wt 155.6 lb

## 2016-06-09 DIAGNOSIS — I10 Essential (primary) hypertension: Secondary | ICD-10-CM | POA: Insufficient documentation

## 2016-06-09 DIAGNOSIS — C8330 Diffuse large B-cell lymphoma, unspecified site: Secondary | ICD-10-CM

## 2016-06-09 DIAGNOSIS — C8198 Hodgkin lymphoma, unspecified, lymph nodes of multiple sites: Secondary | ICD-10-CM

## 2016-06-09 DIAGNOSIS — D649 Anemia, unspecified: Secondary | ICD-10-CM | POA: Insufficient documentation

## 2016-06-09 DIAGNOSIS — R911 Solitary pulmonary nodule: Secondary | ICD-10-CM

## 2016-06-09 DIAGNOSIS — R200 Anesthesia of skin: Secondary | ICD-10-CM | POA: Diagnosis not present

## 2016-06-09 DIAGNOSIS — Z7982 Long term (current) use of aspirin: Secondary | ICD-10-CM | POA: Diagnosis not present

## 2016-06-09 DIAGNOSIS — F17210 Nicotine dependence, cigarettes, uncomplicated: Secondary | ICD-10-CM | POA: Diagnosis not present

## 2016-06-09 DIAGNOSIS — M129 Arthropathy, unspecified: Secondary | ICD-10-CM | POA: Diagnosis not present

## 2016-06-09 DIAGNOSIS — F419 Anxiety disorder, unspecified: Secondary | ICD-10-CM

## 2016-06-09 DIAGNOSIS — R42 Dizziness and giddiness: Secondary | ICD-10-CM | POA: Insufficient documentation

## 2016-06-09 DIAGNOSIS — Z79899 Other long term (current) drug therapy: Secondary | ICD-10-CM | POA: Diagnosis not present

## 2016-06-09 DIAGNOSIS — I2510 Atherosclerotic heart disease of native coronary artery without angina pectoris: Secondary | ICD-10-CM | POA: Diagnosis not present

## 2016-06-09 DIAGNOSIS — C8580 Other specified types of non-Hodgkin lymphoma, unspecified site: Secondary | ICD-10-CM | POA: Diagnosis not present

## 2016-06-09 DIAGNOSIS — Z5111 Encounter for antineoplastic chemotherapy: Secondary | ICD-10-CM | POA: Insufficient documentation

## 2016-06-09 LAB — CBC WITH DIFFERENTIAL/PLATELET
Basophils Absolute: 0.1 10*3/uL (ref 0–0.1)
Basophils Relative: 2 %
Eosinophils Absolute: 0.2 10*3/uL (ref 0–0.7)
Eosinophils Relative: 3 %
HCT: 27.9 % — ABNORMAL LOW (ref 35.0–47.0)
Hemoglobin: 9.4 g/dL — ABNORMAL LOW (ref 12.0–16.0)
Lymphocytes Relative: 25 %
Lymphs Abs: 1.7 10*3/uL (ref 1.0–3.6)
MCH: 29.7 pg (ref 26.0–34.0)
MCHC: 33.7 g/dL (ref 32.0–36.0)
MCV: 88.1 fL (ref 80.0–100.0)
Monocytes Absolute: 1 10*3/uL — ABNORMAL HIGH (ref 0.2–0.9)
Monocytes Relative: 15 %
Neutro Abs: 3.7 10*3/uL (ref 1.4–6.5)
Neutrophils Relative %: 55 %
Platelets: 354 10*3/uL (ref 150–440)
RBC: 3.17 MIL/uL — ABNORMAL LOW (ref 3.80–5.20)
RDW: 20.8 % — ABNORMAL HIGH (ref 11.5–14.5)
WBC: 6.7 10*3/uL (ref 3.6–11.0)

## 2016-06-09 LAB — LACTATE DEHYDROGENASE: LDH: 112 U/L (ref 98–192)

## 2016-06-09 LAB — COMPREHENSIVE METABOLIC PANEL
ALT: 14 U/L (ref 14–54)
AST: 21 U/L (ref 15–41)
Albumin: 3.1 g/dL — ABNORMAL LOW (ref 3.5–5.0)
Alkaline Phosphatase: 77 U/L (ref 38–126)
Anion gap: 7 (ref 5–15)
BUN: 19 mg/dL (ref 6–20)
CO2: 22 mmol/L (ref 22–32)
Calcium: 8.6 mg/dL — ABNORMAL LOW (ref 8.9–10.3)
Chloride: 104 mmol/L (ref 101–111)
Creatinine, Ser: 1.07 mg/dL — ABNORMAL HIGH (ref 0.44–1.00)
GFR calc Af Amer: 58 mL/min — ABNORMAL LOW (ref >60)
GFR calc non Af Amer: 50 mL/min — ABNORMAL LOW (ref >60)
Glucose, Bld: 111 mg/dL — ABNORMAL HIGH (ref 65–99)
Potassium: 3.6 mmol/L (ref 3.5–5.1)
Sodium: 133 mmol/L — ABNORMAL LOW (ref 135–145)
Total Bilirubin: 0.7 mg/dL (ref 0.3–1.2)
Total Protein: 6.2 g/dL — ABNORMAL LOW (ref 6.5–8.1)

## 2016-06-09 LAB — URIC ACID: Uric Acid, Serum: 3.8 mg/dL (ref 2.3–6.6)

## 2016-06-09 MED ORDER — SODIUM CHLORIDE 0.9 % IV SOLN
Freq: Once | INTRAVENOUS | Status: AC
  Administered 2016-06-09: 11:00:00 via INTRAVENOUS
  Filled 2016-06-09: qty 1000

## 2016-06-09 MED ORDER — SODIUM CHLORIDE 0.9 % IV SOLN
600.0000 mg | Freq: Once | INTRAVENOUS | Status: AC
  Administered 2016-06-09: 600 mg via INTRAVENOUS
  Filled 2016-06-09: qty 50

## 2016-06-09 MED ORDER — DOXORUBICIN HCL CHEMO IV INJECTION 2 MG/ML
25.0000 mg/m2 | Freq: Once | INTRAVENOUS | Status: AC
  Administered 2016-06-09: 42 mg via INTRAVENOUS
  Filled 2016-06-09: qty 21

## 2016-06-09 MED ORDER — PALONOSETRON HCL INJECTION 0.25 MG/5ML
0.2500 mg | Freq: Once | INTRAVENOUS | Status: AC
  Administered 2016-06-09: 0.25 mg via INTRAVENOUS
  Filled 2016-06-09: qty 5

## 2016-06-09 MED ORDER — SODIUM CHLORIDE 0.9 % IV SOLN: 10.0000 mg | Freq: Once | INTRAVENOUS | Status: DC

## 2016-06-09 MED ORDER — SODIUM CHLORIDE 0.9 % IV SOLN
400.0000 mg/m2 | Freq: Once | INTRAVENOUS | Status: AC
  Administered 2016-06-09: 680 mg via INTRAVENOUS
  Filled 2016-06-09: qty 34

## 2016-06-09 MED ORDER — SODIUM CHLORIDE 0.9% FLUSH
10.0000 mL | INTRAVENOUS | Status: DC | PRN
  Administered 2016-06-09: 10 mL
  Filled 2016-06-09: qty 10

## 2016-06-09 MED ORDER — DEXAMETHASONE SODIUM PHOSPHATE 10 MG/ML IJ SOLN
10.0000 mg | Freq: Once | INTRAMUSCULAR | Status: AC
  Administered 2016-06-09: 10 mg via INTRAVENOUS
  Filled 2016-06-09: qty 1

## 2016-06-09 MED ORDER — ACETAMINOPHEN 325 MG PO TABS
650.0000 mg | ORAL_TABLET | Freq: Once | ORAL | Status: AC
  Administered 2016-06-09: 650 mg via ORAL
  Filled 2016-06-09: qty 2

## 2016-06-09 MED ORDER — VINCRISTINE SULFATE CHEMO INJECTION 1 MG/ML
1.0000 mg | Freq: Once | INTRAVENOUS | Status: AC
  Administered 2016-06-09: 1 mg via INTRAVENOUS
  Filled 2016-06-09: qty 1

## 2016-06-09 MED ORDER — HEPARIN SOD (PORK) LOCK FLUSH 100 UNIT/ML IV SOLN
500.0000 [IU] | Freq: Once | INTRAVENOUS | Status: AC | PRN
  Administered 2016-06-09: 500 [IU]
  Filled 2016-06-09: qty 5

## 2016-06-09 MED ORDER — DIPHENHYDRAMINE HCL 25 MG PO CAPS
50.0000 mg | ORAL_CAPSULE | Freq: Once | ORAL | Status: AC
  Administered 2016-06-09: 50 mg via ORAL
  Filled 2016-06-09: qty 2

## 2016-06-09 MED ORDER — SODIUM CHLORIDE 0.9 % IV SOLN: 375.0000 mg/m2 | Freq: Once | INTRAVENOUS | Status: DC

## 2016-06-09 NOTE — Progress Notes (Signed)
.[facility] day:  06/09/2016   Chief Complaint: Toni Griffith is a 75 y.o. female with a gray zone lymphoma who is seen for assessment prior to cycle #4 mini-RCHOP.  HPI:  The patient was last seen in the medical oncology clinic on 05/26/2016.  At that time, she was day 8 s/p cycle #3 mini-RCHOP.  She was dizzy on standing ("feels anxious").  Exam revealed questionable slight right lower extremity strength and sensation decrease.  Hematocrit was 27.2 (hemoglobin 9.3).  White count and ANC were normal.  We discussed restaging PET scan.  PET scan on 06/06/2016 revealed significant interval response to therapy.  There was significant decrease in size and FDG uptake associated with left lower lobe lung lesion.  There was resolution of left-sided pleural base metastasis and left axillary and anterior mediastinal nodal metastasis.  There was resolution of previous hypermetabolic soft tissue metastasis/nodal metastasis posterior to the IVC.  There was marked improvement in hypermetabolic metastasis involving the right iliac wing.  There was persistent right upper lobe lung nodule which exhibits intense radiotracer uptake which may represent a focus of metastatic disease or synchronous primary lung neoplasm.  Symptomatically, she has done well.  She notes some "trouble with her legs".  She denies any numbness or weakness.  She notes that the cold bothers her arthritis.  She notes a little numbness in the tips of her 1st and 2nd fingers.   Past Medical History:  Diagnosis Date  . Arthritis   . Cancer ([place])   . Hypertension     Past Surgical History:  Procedure Laterality Date  . CARDIAC SURGERY  1993  . CYST REMOVAL HAND    . PERIPHERAL VASCULAR CATHETERIZATION N/A 04/06/2016   Procedure: Glori Luis Cath Insertion;  Surgeon: Algernon Huxley, MD;  Location: [facility];  Service: Cardiovascular;  Laterality: N/A;    Family History  Problem Relation Age of  Onset  . Lung cancer Mother   . Lung cancer Father   . Diabetes Maternal Uncle   . Myasthenia gravis Maternal Grandmother   . Leukemia Maternal Grandfather     Social History:  reports that she has quit smoking. She has never used smokeless tobacco. She reports that she drinks alcohol. She reports that she does not use drugs.  She smoked 1 1/2 packs per day for 20 years (30 pack year smoking history) until 3 months ago.  She is hearing impaired (deaf).  She lives in [place] with her boyfriend.  Her daughter lives in [place]. The patient is accompanied by her daughter-in-law and the interpreter (sign language) today.  Allergies: No Known Allergies  Current Medications: Current Outpatient Prescriptions  Medication Sig Dispense Refill  . acetaminophen (TYLENOL) 325 MG tablet Take 650 mg by mouth.    Marland Kitchen allopurinol (ZYLOPRIM) 300 MG tablet Take 1 tablet (300 mg total) by mouth daily. 30 tablet 3  . aspirin 325 MG tablet Take 325 mg by mouth daily.    Marland Kitchen atorvastatin (LIPITOR) 80 MG tablet Take 80 mg by mouth.    . B Complex Vitamins (B COMPLEX 1 PO) Take by mouth.    Marland Kitchen FLUoxetine (PROZAC) 40 MG capsule Take 40 mg by mouth.    . gabapentin (NEURONTIN) 100 MG capsule     . lidocaine-prilocaine (EMLA) cream Apply cream 1 hour before chemotherapy treatment and place small peive of saran wrap over cream to protect clothing 30 g 1  . naproxen sodium (ANAPROX)  220 MG tablet Take 220 mg by mouth as needed.    . ondansetron (ZOFRAN ODT) 4 MG disintegrating tablet Take 1 tablet (4 mg total) by mouth every 8 (eight) hours as needed for nausea or vomiting. 20 tablet 0  . pantoprazole (PROTONIX) 40 MG tablet Take 40 mg by mouth.    . potassium chloride (K-DUR) 10 MEQ tablet Take 1 tablet (10 mEq total) by mouth daily. 30 tablet 0  . predniSONE (DELTASONE) 20 MG tablet Take 3.5 tablets (70 mg total) by mouth daily with breakfast. Take on days 1-5 of chemotherapy. 20 tablet 5  . promethazine (PHENERGAN)  12.5 MG tablet Take 1 tablet (12.5 mg total) by mouth every 6 (six) hours as needed for nausea or vomiting. 20 tablet 0  . triamterene-hydrochlorothiazide (DYAZIDE) 37.5-25 MG capsule Take by mouth.    . vitamin B-12 (CYANOCOBALAMIN) 500 MCG tablet Take 500 mcg by mouth daily.    . vitamin E 400 UNIT capsule Take by mouth.    . Garlic 4315 MG CAPS Take by mouth.    . ondansetron (ZOFRAN) 8 MG tablet Take 1 tablet (8 mg total) by mouth 2 (two) times daily as needed for refractory nausea / vomiting. Start on day 3 after cyclophosphamide chemotherapy. (Patient not taking: Reported on 06/09/2016) 30 tablet 1   No current facility-administered medications for this visit.     Review of Systems:  GENERAL:  Feels "good".  No fever or chills.  Weight up 4 pounds. PERFORMANCE STATUS (ECOG):  2-3 HEENT:  Deaf.  No visual changes, runny nose, sore throat, mouth sores or tenderness. Lungs: No shortness of breath or cough.  No hemoptysis. Cardiac:  No chest pain, palpitations, orthopnea, or PND.  GI:  No nausea, vomiting, diarrhea, constipation, melena or hematochezia. GU:  No urgency, frequency, dysuria, or hematuria. Musculoskeletal: Legs feel stiff.  Arthritis flare with cold weather.  No back pain.  No muscle tenderness. Extremities:  No pain or swelling. Skin:  No rashes or skin changes. Neuro:  Fingertips numb (transient).  No headache, numbness or weakness, balance or coordination issues. Endocrine:  No diabetes, thyroid issues, hot flashes or night sweats. Psych:  No mood changes, depression or anxiety. Pain:  No focal pain. Review of systems:  All other systems reviewed and found to be negative.  Physical Exam: Blood pressure 130/72, pulse 84, temperature (!) 96.3 F (35.7 C), temperature source Tympanic, weight 155 lb 10.3 oz (70.6 kg). GENERAL:  Elderly sitting comfortably in a wheelchair in the exam room in no acute distress.  Slow gait.  She needs assistance onto the table. MENTAL  STATUS:  Alert and oriented to person, place and time. HEAD:  Normocephalic, atraumatic, face symmetric, no Cushingoid features. EYES:  Hazel eyes.  Pupils equal round and reactive to light and accomodation.  No conjunctivitis or scleral icterus. ENT:  Oropharynx clear without lesion.  Upper dentures.  Tongue normal. Mucous membranes moist.  RESPIRATORY:  Clear to auscultation without rales, wheezes or rhonchi. CARDIOVASCULAR:  Regular rate and rhythm without murmur, rub or gallop. ABDOMEN:  Soft, non-tender, with active bowel sounds, and no appreciable hepatosplenomegaly.  No masses. SKIN:  No rashes, ulcers or lesions. EXTREMITIES: No edema, no skin discoloration or tenderness.  No palpable cords. LYMPH NODES: No palpable cervical, supraclavicular, axillary or inguinal adenopathy  NEUROLOGICAL:  Lower extremity strength and sensation symmetric.  Bilateral patellar reflexes symmetric.  No foot drop (able to stand on heels). PSYCH:  Appropriate.   Appointment on 06/09/2016  Component Date Value Ref Range Status  . WBC 06/09/2016 6.7  3.6 - 11.0 K/uL Final  . RBC 06/09/2016 3.17* 3.80 - 5.20 MIL/uL Final  . Hemoglobin 06/09/2016 9.4* 12.0 - 16.0 g/dL Final  . HCT 06/09/2016 27.9* 35.0 - 47.0 % Final  . MCV 06/09/2016 88.1  80.0 - 100.0 fL Final  . MCH 06/09/2016 29.7  26.0 - 34.0 pg Final  . MCHC 06/09/2016 33.7  32.0 - 36.0 g/dL Final  . RDW 06/09/2016 20.8* 11.5 - 14.5 % Final  . Platelets 06/09/2016 354  150 - 440 K/uL Final  . Neutrophils Relative % 06/09/2016 55  % Final  . Neutro Abs 06/09/2016 3.7  1.4 - 6.5 K/uL Final  . Lymphocytes Relative 06/09/2016 25  % Final  . Lymphs Abs 06/09/2016 1.7  1.0 - 3.6 K/uL Final  . Monocytes Relative 06/09/2016 15  % Final  . Monocytes Absolute 06/09/2016 1.0* 0.2 - 0.9 K/uL Final  . Eosinophils Relative 06/09/2016 3  % Final  . Eosinophils Absolute 06/09/2016 0.2  0 - 0.7 K/uL Final  . Basophils Relative 06/09/2016 2  % Final  . Basophils  Absolute 06/09/2016 0.1  0 - 0.1 K/uL Final  . Sodium 06/09/2016 133* 135 - 145 mmol/L Final  . Potassium 06/09/2016 3.6  3.5 - 5.1 mmol/L Final  . Chloride 06/09/2016 104  101 - 111 mmol/L Final  . CO2 06/09/2016 22  22 - 32 mmol/L Final  . Glucose, Bld 06/09/2016 111* 65 - 99 mg/dL Final  . BUN 06/09/2016 19  6 - 20 mg/dL Final  . Creatinine, Ser 06/09/2016 1.07* 0.44 - 1.00 mg/dL Final  . Calcium 06/09/2016 8.6* 8.9 - 10.3 mg/dL Final  . Total Protein 06/09/2016 6.2* 6.5 - 8.1 g/dL Final  . Albumin 06/09/2016 3.1* 3.5 - 5.0 g/dL Final  . AST 06/09/2016 21  15 - 41 U/L Final  . ALT 06/09/2016 14  14 - 54 U/L Final  . Alkaline Phosphatase 06/09/2016 77  38 - 126 U/L Final  . Total Bilirubin 06/09/2016 0.7  0.3 - 1.2 mg/dL Final  . GFR calc non Af Amer 06/09/2016 50* >60 mL/min Final  . GFR calc Af Amer 06/09/2016 58* >60 mL/min Final   Comment: (NOTE) The eGFR has been calculated using the CKD EPI equation. This calculation has not been validated in all clinical situations. eGFR's persistently <60 mL/min signify possible Chronic Kidney Disease.   . Anion gap 06/09/2016 7  5 - 15 Final  . LDH 06/09/2016 112  98 - 192 U/L Final  . Uric Acid, Serum 06/09/2016 3.8  2.3 - 6.6 mg/dL Final    Assessment:  Toni Griffith is a 75 y.o. female with stage IVB B-cell lymphoma, unclassifiable, with features intermediate between diffuse large B-cell lymphoma and classical Hodgkin's lymphoma ("gray zone" lymphoma).  She underwent right iliac wing bone/bone marrow biopsy on 03/08/2016.  Chest, abdomen and pelvic CT scan without contrast on 02/20/2016 revealed a 3.7 cm subpleural mass within the left lower lobe, a 1.1 cm right lower lobe subpleural nodule , 9 mm RUL nodule, and 0.4 cm lingular nodule.  Between the left second, third, and fourth ribs there was 1.9 cm soft tissue mass. There was a 2.5 cm low-attenuation nodule in the left lobe of the thyroid.   There were prominent left axillary nodes (up  to 1.0 cm).   There was no definite mediastinal adenopathy. There was a 5.5 x 4.6 cm cystic lesion in the left hepatic  lobe.  There was a 3.1 cm nodule in the right adrenal gland. There was a 1.2 cm right retrocaval lymph node with additional prominent subcentimeter retroperitoneal nodes. There was 1.0 cm left common iliac node. There was a 6.2 x 5.2 cm soft tissue mass within the right ilium.  Head MRI on 02/21/2016 revealed no evidence of malignancy. 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   There was activity in the superior left ocular orbit.  There was a large lesion centered in the right iliac wing with soft tissue extension.  There were hypermetabolic right external iliac lymph nodes  She was admitted to [facility] in [facility].  She had renal failure and hypercalcemia. She was treated with IVF, calcitonin and Zometa.The patient has a history of coronary artery disease s/p CABG in 1993.  She has no history of heart failure.  She is deaf and requires an interpreter.  Echo on 04/04/2016 revealed an EF of 55-60%.  Hepatitis B and C testing was negative on 03/30/2016.   G6PD assay was normal.  She declined LP with MTX prophylaxis.  She is s/p 3 cycles of mini-RCHOP (04/07/2016 - 05/19/2016).  She has had some transient fingertip numbness.  She has a normocytic anemia.  Labs on 04/17/2016 revealed an elevated ferritin (444), iron saturation (11%), low TIBC (240), B12 (2409), and folate (10.8).  Symptomatically, she feels good.  Exam is unremarkable.  Plan: 1.  Labs today:  CBC with diff,CMP, LDH, uric acid. 2.  Discuss results of PET scan.  She is having an excellent response to therapy.  Discuss continuation of therapy and  restaging after cycle #6. 3.  Discuss right upper lobe lung nodule.  This may represent a primary lung cancer.  Discuss re-evaluating with next PET and possible future biopsy. 4.  Cycle #4 mini-RCHOP today. 5.  RTC on 06/19/2016 for labs (CBC with diff, type and hold) 6.  RTC on 06/30/2016 for MD assessment, labs (CBC with diff, CMP, LDH, uric acid), and cycle #5 mini-RCHOP.   Lequita Asal, MD  06/09/2016, 10:14 AM

## 2016-06-09 NOTE — Progress Notes (Signed)
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 Also having ringing in her ears and joint pain.  Patient is deaf.  Accompanied by daughter and sign language interpreter.

## 2016-06-10 ENCOUNTER — Encounter: Payer: Self-pay | Admitting: Hematology and Oncology

## 2016-06-19 ENCOUNTER — Inpatient Hospital Stay: Payer: Medicare HMO

## 2016-06-19 DIAGNOSIS — C8580 Other specified types of non-Hodgkin lymphoma, unspecified site: Principal | ICD-10-CM

## 2016-06-19 DIAGNOSIS — Z5111 Encounter for antineoplastic chemotherapy: Secondary | ICD-10-CM | POA: Diagnosis not present

## 2016-06-19 DIAGNOSIS — C8330 Diffuse large B-cell lymphoma, unspecified site: Secondary | ICD-10-CM

## 2016-06-19 LAB — CBC WITH DIFFERENTIAL/PLATELET
Basophils Absolute: 0.1 10*3/uL (ref 0–0.1)
Basophils Relative: 2 %
Eosinophils Absolute: 0.2 10*3/uL (ref 0–0.7)
Eosinophils Relative: 4 %
HCT: 26.9 % — ABNORMAL LOW (ref 35.0–47.0)
Hemoglobin: 9 g/dL — ABNORMAL LOW (ref 12.0–16.0)
Lymphocytes Relative: 20 %
Lymphs Abs: 1 10*3/uL (ref 1.0–3.6)
MCH: 29.5 pg (ref 26.0–34.0)
MCHC: 33.5 g/dL (ref 32.0–36.0)
MCV: 87.9 fL (ref 80.0–100.0)
Monocytes Absolute: 0.4 10*3/uL (ref 0.2–0.9)
Monocytes Relative: 8 %
Neutro Abs: 3.4 10*3/uL (ref 1.4–6.5)
Neutrophils Relative %: 66 %
Platelets: 277 10*3/uL (ref 150–440)
RBC: 3.07 MIL/uL — ABNORMAL LOW (ref 3.80–5.20)
RDW: 20.6 % — ABNORMAL HIGH (ref 11.5–14.5)
WBC: 5.2 10*3/uL (ref 3.6–11.0)

## 2016-06-19 LAB — SAMPLE TO BLOOD BANK

## 2016-06-20 ENCOUNTER — Inpatient Hospital Stay: Payer: Medicare HMO

## 2016-06-21 ENCOUNTER — Other Ambulatory Visit: Payer: Self-pay | Admitting: Hematology and Oncology

## 2016-06-29 ENCOUNTER — Other Ambulatory Visit: Payer: Self-pay | Admitting: Hematology and Oncology

## 2016-06-29 DIAGNOSIS — C8580 Other specified types of non-Hodgkin lymphoma, unspecified site: Principal | ICD-10-CM

## 2016-06-29 DIAGNOSIS — C8330 Diffuse large B-cell lymphoma, unspecified site: Secondary | ICD-10-CM

## 2016-06-30 ENCOUNTER — Inpatient Hospital Stay: Payer: Medicare HMO | Attending: Hematology and Oncology | Admitting: Hematology and Oncology

## 2016-06-30 ENCOUNTER — Inpatient Hospital Stay: Payer: Medicare HMO

## 2016-06-30 ENCOUNTER — Other Ambulatory Visit: Payer: Self-pay | Admitting: *Deleted

## 2016-06-30 ENCOUNTER — Encounter: Payer: Self-pay | Admitting: Hematology and Oncology

## 2016-06-30 ENCOUNTER — Other Ambulatory Visit: Payer: Self-pay | Admitting: Hematology and Oncology

## 2016-06-30 VITALS — BP 153/81 | HR 98 | Temp 96.7°F | Resp 18 | Wt 160.5 lb

## 2016-06-30 DIAGNOSIS — R112 Nausea with vomiting, unspecified: Secondary | ICD-10-CM | POA: Diagnosis not present

## 2016-06-30 DIAGNOSIS — Z5112 Encounter for antineoplastic immunotherapy: Secondary | ICD-10-CM | POA: Diagnosis not present

## 2016-06-30 DIAGNOSIS — C8330 Diffuse large B-cell lymphoma, unspecified site: Secondary | ICD-10-CM

## 2016-06-30 DIAGNOSIS — Z5111 Encounter for antineoplastic chemotherapy: Secondary | ICD-10-CM | POA: Diagnosis not present

## 2016-06-30 DIAGNOSIS — Z79899 Other long term (current) drug therapy: Secondary | ICD-10-CM

## 2016-06-30 DIAGNOSIS — I10 Essential (primary) hypertension: Secondary | ICD-10-CM

## 2016-06-30 DIAGNOSIS — R200 Anesthesia of skin: Secondary | ICD-10-CM | POA: Diagnosis not present

## 2016-06-30 DIAGNOSIS — E876 Hypokalemia: Secondary | ICD-10-CM | POA: Insufficient documentation

## 2016-06-30 DIAGNOSIS — Z7982 Long term (current) use of aspirin: Secondary | ICD-10-CM | POA: Diagnosis not present

## 2016-06-30 DIAGNOSIS — Z806 Family history of leukemia: Secondary | ICD-10-CM

## 2016-06-30 DIAGNOSIS — C8198 Hodgkin lymphoma, unspecified, lymph nodes of multiple sites: Secondary | ICD-10-CM

## 2016-06-30 DIAGNOSIS — M129 Arthropathy, unspecified: Secondary | ICD-10-CM | POA: Diagnosis not present

## 2016-06-30 DIAGNOSIS — C7951 Secondary malignant neoplasm of bone: Secondary | ICD-10-CM

## 2016-06-30 DIAGNOSIS — C8580 Other specified types of non-Hodgkin lymphoma, unspecified site: Principal | ICD-10-CM

## 2016-06-30 DIAGNOSIS — E041 Nontoxic single thyroid nodule: Secondary | ICD-10-CM | POA: Diagnosis not present

## 2016-06-30 DIAGNOSIS — Z801 Family history of malignant neoplasm of trachea, bronchus and lung: Secondary | ICD-10-CM | POA: Diagnosis not present

## 2016-06-30 DIAGNOSIS — D649 Anemia, unspecified: Secondary | ICD-10-CM

## 2016-06-30 DIAGNOSIS — Z87891 Personal history of nicotine dependence: Secondary | ICD-10-CM | POA: Diagnosis not present

## 2016-06-30 LAB — CBC WITH DIFFERENTIAL/PLATELET
Basophils Absolute: 0.1 10*3/uL (ref 0–0.1)
Basophils Relative: 1 %
Eosinophils Absolute: 0.3 10*3/uL (ref 0–0.7)
Eosinophils Relative: 4 %
HCT: 29.4 % — ABNORMAL LOW (ref 35.0–47.0)
Hemoglobin: 9.9 g/dL — ABNORMAL LOW (ref 12.0–16.0)
Lymphocytes Relative: 21 %
Lymphs Abs: 1.5 10*3/uL (ref 1.0–3.6)
MCH: 30 pg (ref 26.0–34.0)
MCHC: 33.6 g/dL (ref 32.0–36.0)
MCV: 89.3 fL (ref 80.0–100.0)
Monocytes Absolute: 1 10*3/uL — ABNORMAL HIGH (ref 0.2–0.9)
Monocytes Relative: 15 %
Neutro Abs: 4.1 10*3/uL (ref 1.4–6.5)
Neutrophils Relative %: 59 %
Platelets: 306 10*3/uL (ref 150–440)
RBC: 3.29 MIL/uL — ABNORMAL LOW (ref 3.80–5.20)
RDW: 21.4 % — ABNORMAL HIGH (ref 11.5–14.5)
WBC: 7 10*3/uL (ref 3.6–11.0)

## 2016-06-30 LAB — COMPREHENSIVE METABOLIC PANEL
ALT: 18 U/L (ref 14–54)
AST: 29 U/L (ref 15–41)
Albumin: 3.2 g/dL — ABNORMAL LOW (ref 3.5–5.0)
Alkaline Phosphatase: 67 U/L (ref 38–126)
Anion gap: 6 (ref 5–15)
BUN: 20 mg/dL (ref 6–20)
CO2: 25 mmol/L (ref 22–32)
Calcium: 9 mg/dL (ref 8.9–10.3)
Chloride: 103 mmol/L (ref 101–111)
Creatinine, Ser: 0.88 mg/dL (ref 0.44–1.00)
GFR calc Af Amer: >60 mL/min (ref >60)
GFR calc non Af Amer: >60 mL/min (ref >60)
Glucose, Bld: 110 mg/dL — ABNORMAL HIGH (ref 65–99)
Potassium: 3.5 mmol/L (ref 3.5–5.1)
Sodium: 134 mmol/L — ABNORMAL LOW (ref 135–145)
Total Bilirubin: 0.6 mg/dL (ref 0.3–1.2)
Total Protein: 6.1 g/dL — ABNORMAL LOW (ref 6.5–8.1)

## 2016-06-30 LAB — URIC ACID: Uric Acid, Serum: 3.5 mg/dL (ref 2.3–6.6)

## 2016-06-30 LAB — LACTATE DEHYDROGENASE: LDH: 118 U/L (ref 98–192)

## 2016-06-30 MED ORDER — ALLOPURINOL 300 MG PO TABS: 300.0000 mg | ORAL_TABLET | Freq: Every day | ORAL | 2 refills | Status: DC

## 2016-06-30 MED ORDER — SODIUM CHLORIDE 0.9 % IV SOLN
1.0000 mg | Freq: Once | INTRAVENOUS | Status: AC
  Administered 2016-06-30: 1 mg via INTRAVENOUS
  Filled 2016-06-30: qty 1

## 2016-06-30 MED ORDER — PALONOSETRON HCL INJECTION 0.25 MG/5ML
0.2500 mg | Freq: Once | INTRAVENOUS | Status: AC
  Administered 2016-06-30: 0.25 mg via INTRAVENOUS
  Filled 2016-06-30: qty 5

## 2016-06-30 MED ORDER — HEPARIN SOD (PORK) LOCK FLUSH 100 UNIT/ML IV SOLN
500.0000 [IU] | Freq: Once | INTRAVENOUS | Status: AC
  Administered 2016-06-30: 500 [IU] via INTRAVENOUS
  Filled 2016-06-30: qty 5

## 2016-06-30 MED ORDER — SODIUM CHLORIDE 0.9% FLUSH
10.0000 mL | INTRAVENOUS | Status: DC | PRN
  Administered 2016-06-30: 10 mL via INTRAVENOUS
  Filled 2016-06-30: qty 10

## 2016-06-30 MED ORDER — DEXAMETHASONE SODIUM PHOSPHATE 10 MG/ML IJ SOLN
10.0000 mg | Freq: Once | INTRAMUSCULAR | Status: AC
  Administered 2016-06-30: 10 mg via INTRAVENOUS
  Filled 2016-06-30: qty 1

## 2016-06-30 MED ORDER — ACETAMINOPHEN 325 MG PO TABS
650.0000 mg | ORAL_TABLET | Freq: Once | ORAL | Status: AC
  Administered 2016-06-30: 650 mg via ORAL
  Filled 2016-06-30: qty 2

## 2016-06-30 MED ORDER — SODIUM CHLORIDE 0.9 % IV SOLN: 10.0000 mg | Freq: Once | INTRAVENOUS | Status: DC

## 2016-06-30 MED ORDER — SODIUM CHLORIDE 0.9 % IV SOLN
400.0000 mg/m2 | Freq: Once | INTRAVENOUS | Status: AC
  Administered 2016-06-30: 680 mg via INTRAVENOUS
  Filled 2016-06-30: qty 34

## 2016-06-30 MED ORDER — DIPHENHYDRAMINE HCL 25 MG PO CAPS
50.0000 mg | ORAL_CAPSULE | Freq: Once | ORAL | Status: AC
  Administered 2016-06-30: 50 mg via ORAL
  Filled 2016-06-30: qty 2

## 2016-06-30 MED ORDER — SODIUM CHLORIDE 0.9 % IV SOLN
Freq: Once | INTRAVENOUS | Status: AC
  Administered 2016-06-30: 11:00:00 via INTRAVENOUS
  Filled 2016-06-30: qty 1000

## 2016-06-30 MED ORDER — DOXORUBICIN HCL CHEMO IV INJECTION 2 MG/ML
25.0000 mg/m2 | Freq: Once | INTRAVENOUS | Status: AC
  Administered 2016-06-30: 42 mg via INTRAVENOUS
  Filled 2016-06-30: qty 21

## 2016-06-30 MED ORDER — SODIUM CHLORIDE 0.9 % IV SOLN: 375.0000 mg/m2 | Freq: Once | INTRAVENOUS | Status: DC

## 2016-06-30 MED ORDER — SODIUM CHLORIDE 0.9 % IV SOLN
600.0000 mg | Freq: Once | INTRAVENOUS | Status: AC
  Administered 2016-06-30: 600 mg via INTRAVENOUS
  Filled 2016-06-30: qty 50

## 2016-06-30 NOTE — Progress Notes (Signed)
Patient was having nausea vomiting last night and is feeling fine this morning.

## 2016-06-30 NOTE — Progress Notes (Signed)
.[facility] day:  06/30/2016   Chief Complaint: Toni Griffith is a 75 y.o. female with a gray zone lymphoma who is seen for assessment prior to cycle #5 mini-RCHOP.  HPI:  The patient was last seen in the medical oncology clinic on 06/09/2016.  At that time, she was doing well.  She noted a little numbness in the tips of her 1st and 2nd fingers.  Interval PET scan had revealed an excellent response to therapy.  Exam was stable.  She received cycle #4 mini-RCHOP.  CBC on 06/19/2016 revealed a hematocrit of 26.9, hemoglobin 9, platelets 277,000, white count 5200 with an ANC of 3400.  The patient has done well. She had some nausea and vomiting yesterday.  She took ondansetron.  She had no diarrhea.  Today she feels fine. She notes that her legs felt stiff one day after chemotherapy. She complains of some arthritis secondary to the cold.   Past Medical History:  Diagnosis Date  . Arthritis   . Cancer ([place])   . Hypertension     Past Surgical History:  Procedure Laterality Date  . CARDIAC SURGERY  1993  . CYST REMOVAL HAND    . PERIPHERAL VASCULAR CATHETERIZATION N/A 04/06/2016   Procedure: Glori Luis Cath Insertion;  Surgeon: Algernon Huxley, MD;  Location: [facility];  Service: Cardiovascular;  Laterality: N/A;    Family History  Problem Relation Age of Onset  . Lung cancer Mother   . Lung cancer Father   . Diabetes Maternal Uncle   . Myasthenia gravis Maternal Grandmother   . Leukemia Maternal Grandfather     Social History:  reports that she has quit smoking. She has never used smokeless tobacco. She reports that she drinks alcohol. She reports that she does not use drugs.  She smoked 1 1/2 packs per day for 20 years (30 pack year smoking history) until 3 months ago.  She is hearing impaired (deaf).  She lives in [place] with her boyfriend.  Her daughter lives in [place]. The patient is accompanied by her daughter-in-law and the  interpreter (sign language) today.  Allergies: No Known Allergies  Current Medications: Current Outpatient Prescriptions  Medication Sig Dispense Refill  . acetaminophen (TYLENOL) 325 MG tablet Take 650 mg by mouth.    Marland Kitchen allopurinol (ZYLOPRIM) 300 MG tablet Take 1 tablet (300 mg total) by mouth daily. 30 tablet 2  . aspirin 325 MG tablet Take 325 mg by mouth daily.    Marland Kitchen atorvastatin (LIPITOR) 80 MG tablet Take 80 mg by mouth.    . B Complex Vitamins (B COMPLEX 1 PO) Take by mouth.    Marland Kitchen FLUoxetine (PROZAC) 40 MG capsule Take 40 mg by mouth.    . gabapentin (NEURONTIN) 100 MG capsule     . Garlic 5643 MG CAPS Take by mouth.    Marland Kitchen KLOR-CON 10 10 MEQ tablet TAKE 1 TABLET (10 MEQ TOTAL) BY MOUTH DAILY. 30 tablet 0  . lidocaine-prilocaine (EMLA) cream Apply cream 1 hour before chemotherapy treatment and place small peive of saran wrap over cream to protect clothing 30 g 1  . naproxen sodium (ANAPROX) 220 MG tablet Take 220 mg by mouth as needed.    . ondansetron (ZOFRAN ODT) 4 MG disintegrating tablet Take 1 tablet (4 mg total) by mouth every 8 (eight) hours as needed for nausea or vomiting. 20 tablet 0  . pantoprazole (PROTONIX) 40 MG tablet Take 40 mg by mouth.    Marland Kitchen  predniSONE (DELTASONE) 20 MG tablet Take 3.5 tablets (70 mg total) by mouth daily with breakfast. Take on days 1-5 of chemotherapy. 20 tablet 5  . promethazine (PHENERGAN) 12.5 MG tablet Take 1 tablet (12.5 mg total) by mouth every 6 (six) hours as needed for nausea or vomiting. 20 tablet 0  . triamterene-hydrochlorothiazide (DYAZIDE) 37.5-25 MG capsule Take by mouth.    . vitamin B-12 (CYANOCOBALAMIN) 500 MCG tablet Take 500 mcg by mouth daily.    . vitamin E 400 UNIT capsule Take by mouth.    . ondansetron (ZOFRAN) 8 MG tablet Take 1 tablet (8 mg total) by mouth 2 (two) times daily as needed for refractory nausea / vomiting. Start on day 3 after cyclophosphamide chemotherapy. (Patient not taking: Reported on 06/30/2016) 30 tablet 1    No current facility-administered medications for this visit.    Facility-Administered Medications Ordered in Other Visits  Medication Dose Route Frequency Provider Last Rate Last Dose  . heparin lock flush 100 unit/mL  500 Units Intravenous Once Lequita Asal, MD      . sodium chloride flush (NS) 0.9 % injection 10 mL  10 mL Intravenous PRN Lequita Asal, MD   10 mL at 06/30/16 0856    Review of Systems:  GENERAL:  Feels fine.  No fever or chills.  Weight up 5 pounds. PERFORMANCE STATUS (ECOG):  2 HEENT:  Deaf.  No visual changes, runny nose, sore throat, mouth sores or tenderness. Lungs: No shortness of breath or cough.  No hemoptysis. Cardiac:  No chest pain, palpitations, orthopnea, or PND.  GI:  Nausea and vomiting yesterday.  No diarrhea, constipation, melena or hematochezia. GU:  No urgency, frequency, dysuria, or hematuria. Musculoskeletal: Legs feel stiff after chemo.  Arthritis flare with cold weather.  No back pain.  No muscle tenderness. Extremities:  No pain or swelling. Skin:  No rashes or skin changes. Neuro:  Fingertips numb (transient).  No headache, numbness or weakness, balance or coordination issues. Endocrine:  No diabetes, thyroid issues, hot flashes or night sweats. Psych:  No mood changes, depression or anxiety. Pain:  No focal pain. Review of systems:  All other systems reviewed and found to be negative.  Physical Exam: Blood pressure (!) 153/81, pulse 98, temperature (!) 96.7 F (35.9 C), temperature source Tympanic, resp. rate 18, weight 160 lb 7.9 oz (72.8 kg). GENERAL:  Elderly sitting comfortably in a wheelchair in the exam room in no acute distress.  Slow gait.  She needs assistance onto the table. MENTAL STATUS:  Alert and oriented to person, place and time. HEAD:  Normocephalic, atraumatic, face symmetric, no Cushingoid features. EYES:  Hazel eyes.  Pupils equal round and reactive to light and accomodation.  No conjunctivitis or scleral  icterus. ENT:  Oropharynx clear without lesion.  Upper dentures.  Tongue normal. Mucous membranes moist.  RESPIRATORY:  Clear to auscultation without rales, wheezes or rhonchi. CARDIOVASCULAR:  Regular rate and rhythm without murmur, rub or gallop. ABDOMEN:  Soft, non-tender, with active bowel sounds, and no appreciable hepatosplenomegaly.  No masses. SKIN:  No rashes, ulcers or lesions. EXTREMITIES: No edema, no skin discoloration or tenderness.  No palpable cords. LYMPH NODES: No palpable cervical, supraclavicular, axillary or inguinal adenopathy  NEUROLOGICAL:  Lower extremity strength and sensation symmetric.  Bilateral patellar reflexes symmetric.  No foot drop (able to stand on heels). PSYCH:  Appropriate.   Infusion on 06/30/2016  Component Date Value Ref Range Status  . WBC 06/30/2016 7.0  3.6 -  11.0 K/uL Final  . RBC 06/30/2016 3.29* 3.80 - 5.20 MIL/uL Final  . Hemoglobin 06/30/2016 9.9* 12.0 - 16.0 g/dL Final  . HCT 06/30/2016 29.4* 35.0 - 47.0 % Final  . MCV 06/30/2016 89.3  80.0 - 100.0 fL Final  . MCH 06/30/2016 30.0  26.0 - 34.0 pg Final  . MCHC 06/30/2016 33.6  32.0 - 36.0 g/dL Final  . RDW 06/30/2016 21.4* 11.5 - 14.5 % Final  . Platelets 06/30/2016 306  150 - 440 K/uL Final  . Neutrophils Relative % 06/30/2016 59  % Final  . Neutro Abs 06/30/2016 4.1  1.4 - 6.5 K/uL Final  . Lymphocytes Relative 06/30/2016 21  % Final  . Lymphs Abs 06/30/2016 1.5  1.0 - 3.6 K/uL Final  . Monocytes Relative 06/30/2016 15  % Final  . Monocytes Absolute 06/30/2016 1.0* 0.2 - 0.9 K/uL Final  . Eosinophils Relative 06/30/2016 4  % Final  . Eosinophils Absolute 06/30/2016 0.3  0 - 0.7 K/uL Final  . Basophils Relative 06/30/2016 1  % Final  . Basophils Absolute 06/30/2016 0.1  0 - 0.1 K/uL Final  . Sodium 06/30/2016 134* 135 - 145 mmol/L Final  . Potassium 06/30/2016 3.5  3.5 - 5.1 mmol/L Final  . Chloride 06/30/2016 103  101 - 111 mmol/L Final  . CO2 06/30/2016 25  22 - 32 mmol/L Final   . Glucose, Bld 06/30/2016 110* 65 - 99 mg/dL Final  . BUN 06/30/2016 20  6 - 20 mg/dL Final  . Creatinine, Ser 06/30/2016 0.88  0.44 - 1.00 mg/dL Final  . Calcium 06/30/2016 9.0  8.9 - 10.3 mg/dL Final  . Total Protein 06/30/2016 6.1* 6.5 - 8.1 g/dL Final  . Albumin 06/30/2016 3.2* 3.5 - 5.0 g/dL Final  . AST 06/30/2016 29  15 - 41 U/L Final  . ALT 06/30/2016 18  14 - 54 U/L Final  . Alkaline Phosphatase 06/30/2016 67  38 - 126 U/L Final  . Total Bilirubin 06/30/2016 0.6  0.3 - 1.2 mg/dL Final  . GFR calc non Af Amer 06/30/2016 >60  >60 mL/min Final  . GFR calc Af Amer 06/30/2016 >60  >60 mL/min Final   Comment: (NOTE) The eGFR has been calculated using the CKD EPI equation. This calculation has not been validated in all clinical situations. eGFR's persistently <60 mL/min signify possible Chronic Kidney Disease.   . Anion gap 06/30/2016 6  5 - 15 Final  . LDH 06/30/2016 118  98 - 192 U/L Final  . Uric Acid, Serum 06/30/2016 3.5  2.3 - 6.6 mg/dL Final    Assessment:  Asiana Benninger is a 75 y.o. female with stage IVB B-cell lymphoma, unclassifiable, with features intermediate between diffuse large B-cell lymphoma and classical Hodgkin's lymphoma ("gray zone" lymphoma).  She underwent right iliac wing bone/bone marrow biopsy on 03/08/2016.  Chest, abdomen and pelvic CT scan without contrast on 02/20/2016 revealed a 3.7 cm subpleural mass within the left lower lobe, a 1.1 cm right lower lobe subpleural nodule , 9 mm RUL nodule, and 0.4 cm lingular nodule.  Between the left second, third, and fourth ribs there was 1.9 cm soft tissue mass. There was a 2.5 cm low-attenuation nodule in the left lobe of the thyroid.   There were prominent left axillary nodes (up to 1.0 cm).   There was no definite mediastinal adenopathy. There was a 5.5 x 4.6 cm cystic lesion in the left hepatic lobe.  There was a 3.1 cm nodule in the right  adrenal gland. There was a 1.2 cm right retrocaval lymph node with additional  prominent subcentimeter retroperitoneal nodes. There was 1.0 cm left common iliac node. There was a 6.2 x 5.2 cm soft tissue mass within the right ilium.  Head MRI on 02/21/2016 revealed no evidence of malignancy. 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   There was activity in the superior left ocular orbit.  There was a large lesion centered in the right iliac wing with soft tissue extension.  There were hypermetabolic right external iliac lymph nodes  She was admitted to [facility] in [facility].  She had renal failure and hypercalcemia. She was treated with IVF, calcitonin and Zometa.The patient has a history of coronary artery disease s/p CABG in 1993.  She has no history of heart failure.  She is deaf and requires an interpreter.  Echo on 04/04/2016 revealed an EF of 55-60%.  Hepatitis B and C testing was negative on 03/30/2016.   G6PD assay was normal.  She declined LP with MTX prophylaxis.  She is s/p 4 cycles of mini-RCHOP (04/07/2016 - 06/09/2016).  She has had some transient fingertip numbness.  PET scan on 06/06/2016 revealed significant interval response to therapy.  There was significant decrease in size and FDG uptake associated with left lower lobe lung lesion.  There was resolution of left-sided pleural base metastasis and left axillary and anterior mediastinal nodal metastasis.  There was resolution of previous hypermetabolic soft tissue metastasis/nodal metastasis posterior to the IVC.  There was marked improvement in hypermetabolic metastasis involving the right iliac wing.  There was persistent right upper lobe lung nodule which exhibits intense radiotracer uptake which may represent a focus of metastatic disease or  synchronous primary lung neoplasm.  She has a normocytic anemia.  Labs on 04/17/2016 revealed an elevated ferritin (444), iron saturation (11%), low TIBC (240), B12 (2409), and folate (10.8).  Symptomatically, she feels good.  She had some transient nausea and vomiting yesterday.  Exam is unremarkable.  Plan: 1.  Labs today:  CBC with diff,CMP, LDH, uric acid. 2.  Cycle #5 mini-RCHOP today. 3.  Anticipate restaging PET scan after 6 cycles of chemotherapy. 4.  RTC on 07/10/2016 for labs (CBC with diff, hold tube) 5.  RTC on 07/21/2016 for MD assessment, labs (CBC with diff, CMP, LDH, uric acid), cycle #6 mini-RCHOP.   Lequita Asal, MD  06/30/2016, 10:00 AM

## 2016-07-10 ENCOUNTER — Inpatient Hospital Stay: Payer: Medicare HMO

## 2016-07-10 DIAGNOSIS — C7951 Secondary malignant neoplasm of bone: Secondary | ICD-10-CM

## 2016-07-10 DIAGNOSIS — C8580 Other specified types of non-Hodgkin lymphoma, unspecified site: Principal | ICD-10-CM

## 2016-07-10 DIAGNOSIS — Z5111 Encounter for antineoplastic chemotherapy: Secondary | ICD-10-CM | POA: Diagnosis not present

## 2016-07-10 DIAGNOSIS — C8198 Hodgkin lymphoma, unspecified, lymph nodes of multiple sites: Secondary | ICD-10-CM

## 2016-07-10 DIAGNOSIS — C8330 Diffuse large B-cell lymphoma, unspecified site: Secondary | ICD-10-CM

## 2016-07-10 LAB — CBC WITH DIFFERENTIAL/PLATELET
Basophils Absolute: 0.1 10*3/uL (ref 0–0.1)
Basophils Relative: 1 %
Eosinophils Absolute: 0.2 10*3/uL (ref 0–0.7)
Eosinophils Relative: 4 %
HCT: 30.4 % — ABNORMAL LOW (ref 35.0–47.0)
Hemoglobin: 10.2 g/dL — ABNORMAL LOW (ref 12.0–16.0)
Lymphocytes Relative: 19 %
Lymphs Abs: 0.9 10*3/uL — ABNORMAL LOW (ref 1.0–3.6)
MCH: 30.2 pg (ref 26.0–34.0)
MCHC: 33.7 g/dL (ref 32.0–36.0)
MCV: 89.4 fL (ref 80.0–100.0)
Monocytes Absolute: 0.3 10*3/uL (ref 0.2–0.9)
Monocytes Relative: 5 %
Neutro Abs: 3.5 10*3/uL (ref 1.4–6.5)
Neutrophils Relative %: 71 %
Platelets: 246 10*3/uL (ref 150–440)
RBC: 3.39 MIL/uL — ABNORMAL LOW (ref 3.80–5.20)
RDW: 20.6 % — ABNORMAL HIGH (ref 11.5–14.5)
WBC: 4.9 10*3/uL (ref 3.6–11.0)

## 2016-07-10 LAB — SAMPLE TO BLOOD BANK

## 2016-07-11 ENCOUNTER — Inpatient Hospital Stay: Payer: Medicare HMO

## 2016-07-20 ENCOUNTER — Other Ambulatory Visit: Payer: Self-pay | Admitting: Hematology and Oncology

## 2016-07-21 ENCOUNTER — Other Ambulatory Visit: Payer: Self-pay | Admitting: Hematology and Oncology

## 2016-07-21 ENCOUNTER — Ambulatory Visit: Payer: Medicare HMO

## 2016-07-21 ENCOUNTER — Inpatient Hospital Stay: Payer: Medicare HMO

## 2016-07-21 ENCOUNTER — Inpatient Hospital Stay (HOSPITAL_BASED_OUTPATIENT_CLINIC_OR_DEPARTMENT_OTHER): Payer: Medicare HMO | Admitting: Oncology

## 2016-07-21 VITALS — BP 121/72 | HR 87 | Temp 98.0°F | Resp 18 | Wt 163.0 lb

## 2016-07-21 DIAGNOSIS — C8330 Diffuse large B-cell lymphoma, unspecified site: Secondary | ICD-10-CM

## 2016-07-21 DIAGNOSIS — Z87891 Personal history of nicotine dependence: Secondary | ICD-10-CM | POA: Diagnosis not present

## 2016-07-21 DIAGNOSIS — R112 Nausea with vomiting, unspecified: Secondary | ICD-10-CM | POA: Diagnosis not present

## 2016-07-21 DIAGNOSIS — Z801 Family history of malignant neoplasm of trachea, bronchus and lung: Secondary | ICD-10-CM | POA: Diagnosis not present

## 2016-07-21 DIAGNOSIS — E876 Hypokalemia: Secondary | ICD-10-CM | POA: Diagnosis not present

## 2016-07-21 DIAGNOSIS — Z79899 Other long term (current) drug therapy: Secondary | ICD-10-CM | POA: Diagnosis not present

## 2016-07-21 DIAGNOSIS — Z5111 Encounter for antineoplastic chemotherapy: Secondary | ICD-10-CM | POA: Diagnosis not present

## 2016-07-21 DIAGNOSIS — C8198 Hodgkin lymphoma, unspecified, lymph nodes of multiple sites: Secondary | ICD-10-CM

## 2016-07-21 DIAGNOSIS — C8580 Other specified types of non-Hodgkin lymphoma, unspecified site: Secondary | ICD-10-CM

## 2016-07-21 DIAGNOSIS — I10 Essential (primary) hypertension: Secondary | ICD-10-CM

## 2016-07-21 DIAGNOSIS — Z7982 Long term (current) use of aspirin: Secondary | ICD-10-CM | POA: Diagnosis not present

## 2016-07-21 DIAGNOSIS — R200 Anesthesia of skin: Secondary | ICD-10-CM | POA: Diagnosis not present

## 2016-07-21 DIAGNOSIS — E041 Nontoxic single thyroid nodule: Secondary | ICD-10-CM | POA: Diagnosis not present

## 2016-07-21 DIAGNOSIS — D649 Anemia, unspecified: Secondary | ICD-10-CM | POA: Diagnosis not present

## 2016-07-21 DIAGNOSIS — M129 Arthropathy, unspecified: Secondary | ICD-10-CM

## 2016-07-21 DIAGNOSIS — C7951 Secondary malignant neoplasm of bone: Secondary | ICD-10-CM

## 2016-07-21 DIAGNOSIS — Z806 Family history of leukemia: Secondary | ICD-10-CM

## 2016-07-21 LAB — CBC WITH DIFFERENTIAL/PLATELET
Basophils Absolute: 0.1 10*3/uL (ref 0–0.1)
Basophils Relative: 2 %
Eosinophils Absolute: 0.3 10*3/uL (ref 0–0.7)
Eosinophils Relative: 5 %
HCT: 31.4 % — ABNORMAL LOW (ref 35.0–47.0)
Hemoglobin: 10.5 g/dL — ABNORMAL LOW (ref 12.0–16.0)
Lymphocytes Relative: 24 %
Lymphs Abs: 1.2 10*3/uL (ref 1.0–3.6)
MCH: 29.8 pg (ref 26.0–34.0)
MCHC: 33.3 g/dL (ref 32.0–36.0)
MCV: 89.4 fL (ref 80.0–100.0)
Monocytes Absolute: 0.7 10*3/uL (ref 0.2–0.9)
Monocytes Relative: 14 %
Neutro Abs: 2.7 10*3/uL (ref 1.4–6.5)
Neutrophils Relative %: 55 %
Platelets: 333 10*3/uL (ref 150–440)
RBC: 3.51 MIL/uL — ABNORMAL LOW (ref 3.80–5.20)
RDW: 19.7 % — ABNORMAL HIGH (ref 11.5–14.5)
WBC: 5 10*3/uL (ref 3.6–11.0)

## 2016-07-21 LAB — COMPREHENSIVE METABOLIC PANEL
ALT: 16 U/L (ref 14–54)
AST: 25 U/L (ref 15–41)
Albumin: 3.1 g/dL — ABNORMAL LOW (ref 3.5–5.0)
Alkaline Phosphatase: 71 U/L (ref 38–126)
Anion gap: 6 (ref 5–15)
BUN: 21 mg/dL — ABNORMAL HIGH (ref 6–20)
CO2: 22 mmol/L (ref 22–32)
Calcium: 8.6 mg/dL — ABNORMAL LOW (ref 8.9–10.3)
Chloride: 107 mmol/L (ref 101–111)
Creatinine, Ser: 1.05 mg/dL — ABNORMAL HIGH (ref 0.44–1.00)
GFR calc Af Amer: 60 mL/min — ABNORMAL LOW (ref >60)
GFR calc non Af Amer: 51 mL/min — ABNORMAL LOW (ref >60)
Glucose, Bld: 150 mg/dL — ABNORMAL HIGH (ref 65–99)
Potassium: 3.3 mmol/L — ABNORMAL LOW (ref 3.5–5.1)
Sodium: 135 mmol/L (ref 135–145)
Total Bilirubin: 0.6 mg/dL (ref 0.3–1.2)
Total Protein: 6.1 g/dL — ABNORMAL LOW (ref 6.5–8.1)

## 2016-07-21 LAB — URIC ACID: Uric Acid, Serum: 3.9 mg/dL (ref 2.3–6.6)

## 2016-07-21 LAB — LACTATE DEHYDROGENASE: LDH: 119 U/L (ref 98–192)

## 2016-07-21 MED ORDER — RITUXIMAB CHEMO INJECTION 500 MG/50ML
680.0000 mg | Freq: Once | INTRAVENOUS | Status: AC
  Administered 2016-07-21: 700 mg via INTRAVENOUS
  Filled 2016-07-21: qty 50

## 2016-07-21 MED ORDER — VINCRISTINE SULFATE CHEMO INJECTION 1 MG/ML
1.0000 mg | Freq: Once | INTRAVENOUS | Status: AC
  Administered 2016-07-21: 1 mg via INTRAVENOUS
  Filled 2016-07-21: qty 1

## 2016-07-21 MED ORDER — POTASSIUM CHLORIDE ER 10 MEQ PO TBCR: 10.0000 meq | EXTENDED_RELEASE_TABLET | Freq: Every day | ORAL | 1 refills | Status: DC

## 2016-07-21 MED ORDER — DEXAMETHASONE SODIUM PHOSPHATE 10 MG/ML IJ SOLN
10.0000 mg | Freq: Once | INTRAMUSCULAR | Status: AC
  Administered 2016-07-21: 10 mg via INTRAVENOUS
  Filled 2016-07-21: qty 1

## 2016-07-21 MED ORDER — SODIUM CHLORIDE 0.9 % IV SOLN
Freq: Once | INTRAVENOUS | Status: AC
  Administered 2016-07-21: 10:00:00 via INTRAVENOUS
  Filled 2016-07-21: qty 1000

## 2016-07-21 MED ORDER — PALONOSETRON HCL INJECTION 0.25 MG/5ML
0.2500 mg | Freq: Once | INTRAVENOUS | Status: AC
Start: 2016-07-21 — End: 2016-07-21
  Administered 2016-07-21: 0.25 mg via INTRAVENOUS
  Filled 2016-07-21: qty 5

## 2016-07-21 MED ORDER — DOXORUBICIN HCL CHEMO IV INJECTION 2 MG/ML
25.0000 mg/m2 | Freq: Once | INTRAVENOUS | Status: AC
  Administered 2016-07-21: 42 mg via INTRAVENOUS
  Filled 2016-07-21: qty 21

## 2016-07-21 MED ORDER — RITUXIMAB CHEMO INJECTION 500 MG/50ML: 400.0000 mg/m2 | Freq: Once | INTRAVENOUS | Status: DC

## 2016-07-21 MED ORDER — ACETAMINOPHEN 325 MG PO TABS
650.0000 mg | ORAL_TABLET | Freq: Once | ORAL | Status: AC
  Administered 2016-07-21: 650 mg via ORAL
  Filled 2016-07-21: qty 2

## 2016-07-21 MED ORDER — DIPHENHYDRAMINE HCL 25 MG PO CAPS
50.0000 mg | ORAL_CAPSULE | Freq: Once | ORAL | Status: AC
  Administered 2016-07-21: 50 mg via ORAL
  Filled 2016-07-21: qty 2

## 2016-07-21 MED ORDER — SODIUM CHLORIDE 0.9 % IV SOLN: 10.0000 mg | Freq: Once | INTRAVENOUS | Status: DC

## 2016-07-21 MED ORDER — SODIUM CHLORIDE 0.9% FLUSH
10.0000 mL | INTRAVENOUS | Status: DC | PRN
  Administered 2016-07-21: 10 mL
  Filled 2016-07-21: qty 10

## 2016-07-21 MED ORDER — SODIUM CHLORIDE 0.9 % IV SOLN: 600.0000 mg | Freq: Once | INTRAVENOUS | Status: DC

## 2016-07-21 MED ORDER — CYCLOPHOSPHAMIDE CHEMO INJECTION 1 GM
400.0000 mg/m2 | Freq: Once | INTRAMUSCULAR | Status: AC
  Administered 2016-07-21: 680 mg via INTRAVENOUS
  Filled 2016-07-21: qty 34

## 2016-07-21 MED ORDER — SODIUM CHLORIDE 0.9 % IV SOLN
INTRAVENOUS | Status: DC
  Administered 2016-07-21: 10:00:00 via INTRAVENOUS
  Filled 2016-07-21 (×2): qty 100

## 2016-07-21 MED ORDER — HEPARIN SOD (PORK) LOCK FLUSH 100 UNIT/ML IV SOLN
500.0000 [IU] | Freq: Once | INTRAVENOUS | Status: AC | PRN
  Administered 2016-07-21: 500 [IU]
  Filled 2016-07-21: qty 5

## 2016-07-21 NOTE — Progress Notes (Signed)
Hematology/Oncology Consult note [facility]  Telephone:([phone] [phone]  Patient Care Team: Laurel Dimmer. Enid Derry, MD as PCP - General (Internal Medicine)   Name of the patient: Toni Griffith  [id number]  11/11/1940   Date of visit: 07/21/16  Diagnosis- gray zone lymphoma currently undergoing R CHOP chemotherapy  Chief complaint/ Reason for visit- on treatment assessment for chemotherapy prior to cycle #6 of R CHOP for gray zone lymphoma  Heme/Onc history:  Toni Griffith is a 75 y.o. female with stage IVB B-cell lymphoma, unclassifiable, with features intermediate between diffuse large B-cell lymphoma and classical Hodgkin's lymphoma ("gray zone" lymphoma).  She underwent right iliac wing bone/bone marrow biopsy on 03/08/2016.  Chest, abdomen and pelvic CT scan without contrast on 02/20/2016 revealed a 3.7 cm subpleural mass within the left lower lobe, a 1.1 cm right lower lobe subpleural nodule , 9 mm RUL nodule, and 0.4 cm lingular nodule.  Between the left second, third, and fourth ribs there was 1.9 cm soft tissue mass. There was a 2.5 cm low-attenuation nodule in the left lobe of the thyroid.   There were prominent left axillary nodes (up to 1.0 cm).   There was no definite mediastinal adenopathy. There was a 5.5 x 4.6 cm cystic lesion in the left hepatic lobe.  There was a 3.1 cm nodule in the right adrenal gland. There was a 1.2 cm right retrocaval lymph node with additional prominent subcentimeter retroperitoneal nodes. There was 1.0 cm left common iliac node. There was a 6.2 x 5.2 cm soft tissue mass within the right ilium.  Head MRI on 02/21/2016 revealed no evidence of malignancy. 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   There was activity in the superior left ocular orbit.  There was a large lesion centered in the right iliac wing with soft tissue extension.  There were hypermetabolic right external iliac lymph nodes  She was admitted to [facility] in [facility].  She had renal failure and hypercalcemia. She was treated with IVF, calcitonin and Zometa.The patient has a history of coronary artery disease s/p CABG in 1993.  She has no history of heart failure.  She is deaf and requires an interpreter.  Echo on 04/04/2016 revealed an EF of 55-60%.  Hepatitis B and C testing was negative on 03/30/2016.   G6PD assay was normal.  She declined LP with MTX prophylaxis.  She is s/p 4 cycles of mini-RCHOP (04/07/2016 - 06/09/2016).  She has had some transient fingertip numbness.  PET scan on 06/06/2016 revealed significant interval response to therapy.  There was significant decrease in size and FDG uptake associated with left lower lobe lung lesion.  There was resolution of left-sided pleural base metastasis and left axillary and anterior mediastinal nodal metastasis.  There was resolution of previous hypermetabolic soft tissue metastasis/nodal metastasis posterior to the IVC.  There was marked improvement in hypermetabolic metastasis involving the right iliac wing.  There was persistent right upper lobe lung nodule which exhibits intense radiotracer uptake which may represent a focus of metastatic disease or synchronous primary lung neoplasm.  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 History is obtained with the help of her daughter who used to sign  language to communicate with her today   Overall she reports doing well. Energy levels and appetite is stable. She has mild tingling and numbness in her bilateral hands but that has remained stable.  ECOG PS- 2 Pain scale- 3-chronic back pain  Opioid associated constipation-  no  Review of systems- Review of Systems  Constitutional: Negative for chills, fever, malaise/fatigue and weight loss.  HENT: Negative for congestion, ear discharge and nosebleeds.   Eyes: Negative for blurred vision.  Respiratory: Negative for cough, hemoptysis, sputum production, shortness of breath and wheezing.   Cardiovascular: Negative for chest pain, palpitations, orthopnea and claudication.  Gastrointestinal: Negative for abdominal pain, blood in stool, constipation, diarrhea, heartburn, melena, nausea and vomiting.  Genitourinary: Negative for dysuria, flank pain, frequency, hematuria and urgency.  Musculoskeletal: Positive for back pain. Negative for joint pain and myalgias.  Skin: Negative for rash.  Neurological: Negative for dizziness, tingling, focal weakness, seizures, weakness and headaches.  Endo/Heme/Allergies: Does not bruise/bleed easily.  Psychiatric/Behavioral: Negative for depression and suicidal ideas. The patient does not have insomnia.      Current treatment- status post 5 cycles of mini R CHOP  No Known Allergies   Past Medical History:  Diagnosis Date  . Arthritis   . Cancer ([place])   . Hypertension      Past Surgical History:  Procedure Laterality Date  . CARDIAC SURGERY  1993  . CYST REMOVAL HAND    . PERIPHERAL VASCULAR CATHETERIZATION N/A 04/06/2016   Procedure: Glori Luis Cath Insertion;  Surgeon: Algernon Huxley, MD;  Location: [place] [facility];  Service: Cardiovascular;  Laterality: N/A;    Social History   Social History  . Marital status: Single    Spouse name: N/A  . Number of children: N/A  . Years of education: N/A   Occupational History  . Not on file.   Social History Main Topics  . Smoking status: Former Research scientist (life sciences)  . Smokeless tobacco: Never Used  . Alcohol use Yes  . Drug use: No  . Sexual activity: Not on file   Other Topics Concern  . Not on file   Social History Narrative  . No narrative on file    Family History   Problem Relation Age of Onset  . Lung cancer Mother   . Lung cancer Father   . Diabetes Maternal Uncle   . Myasthenia gravis Maternal Grandmother   . Leukemia Maternal Grandfather      Current Outpatient Prescriptions:  .  acetaminophen (TYLENOL) 325 MG tablet, Take 650 mg by mouth., Disp: , Rfl:  .  allopurinol (ZYLOPRIM) 300 MG tablet, Take 1 tablet (300 mg total) by mouth daily., Disp: 30 tablet, Rfl: 2 .  aspirin 325 MG tablet, Take 325 mg by mouth daily., Disp: , Rfl:  .  atorvastatin (LIPITOR) 80 MG tablet, Take 80 mg by mouth., Disp: , Rfl:  .  B Complex Vitamins (B COMPLEX 1 PO), Take by mouth., Disp: , Rfl:  .  FLUoxetine (PROZAC) 40 MG capsule, Take 40 mg by mouth., Disp: , Rfl:  .  gabapentin (NEURONTIN) 100 MG capsule, , Disp: , Rfl:  .  Garlic 6767 MG CAPS, Take by mouth., Disp: , Rfl:  .  KLOR-CON 10 10 MEQ tablet, TAKE 1 TABLET (10 MEQ TOTAL) BY MOUTH DAILY., Disp: 30 tablet, Rfl: 0 .  lidocaine-prilocaine (EMLA) cream, Apply cream 1 hour before chemotherapy treatment and place small peive of saran wrap over cream to protect clothing, Disp: 30 g, Rfl:  1 .  naproxen sodium (ANAPROX) 220 MG tablet, Take 220 mg by mouth as needed., Disp: , Rfl:  .  ondansetron (ZOFRAN ODT) 4 MG disintegrating tablet, Take 1 tablet (4 mg total) by mouth every 8 (eight) hours as needed for nausea or vomiting., Disp: 20 tablet, Rfl: 0 .  ondansetron (ZOFRAN) 8 MG tablet, Take 1 tablet (8 mg total) by mouth 2 (two) times daily as needed for refractory nausea / vomiting. Start on day 3 after cyclophosphamide chemotherapy. (Patient not taking: Reported on 06/30/2016), Disp: 30 tablet, Rfl: 1 .  pantoprazole (PROTONIX) 40 MG tablet, Take 40 mg by mouth., Disp: , Rfl:  .  predniSONE (DELTASONE) 20 MG tablet, Take 3.5 tablets (70 mg total) by mouth daily with breakfast. Take on days 1-5 of chemotherapy., Disp: 20 tablet, Rfl: 5 .  promethazine (PHENERGAN) 12.5 MG tablet, Take 1 tablet (12.5 mg total) by  mouth every 6 (six) hours as needed for nausea or vomiting., Disp: 20 tablet, Rfl: 0 .  triamterene-hydrochlorothiazide (DYAZIDE) 37.5-25 MG capsule, Take by mouth., Disp: , Rfl:  .  vitamin B-12 (CYANOCOBALAMIN) 500 MCG tablet, Take 500 mcg by mouth daily., Disp: , Rfl:  .  vitamin E 400 UNIT capsule, Take by mouth., Disp: , Rfl:   Physical exam:  Vitals:   07/21/16 0857  BP: 121/72  Pulse: 87  Resp: 18  Temp: 98 F (36.7 C)  TempSrc: Tympanic  Weight: 163 lb 0.5 oz (73.9 kg)   Physical Exam  Constitutional: She is oriented to person, place, and time and well-developed, well-nourished, and in no distress.  Patient is sitting on a wheelchair but able to ambulate at baseline  HENT:  Head: Normocephalic and atraumatic.  Eyes: EOM are normal. Pupils are equal, round, and reactive to light.  Neck: Normal range of motion.  Cardiovascular: Normal rate, regular rhythm and normal heart sounds.   Pulmonary/Chest: Effort normal and breath sounds normal.  Abdominal: Soft. Bowel sounds are normal.  Neurological: She is alert and oriented to person, place, and time.  Skin: Skin is warm and dry.     CMP Latest Ref Rng & Units 06/30/2016  Glucose 65 - 99 mg/dL 110(H)  BUN 6 - 20 mg/dL 20  Creatinine 0.44 - 1.00 mg/dL 0.88  Sodium 135 - 145 mmol/L 134(L)  Potassium 3.5 - 5.1 mmol/L 3.5  Chloride 101 - 111 mmol/L 103  CO2 22 - 32 mmol/L 25  Calcium 8.9 - 10.3 mg/dL 9.0  Total Protein 6.5 - 8.1 g/dL 6.1(L)  Total Bilirubin 0.3 - 1.2 mg/dL 0.6  Alkaline Phos 38 - 126 U/L 67  AST 15 - 41 U/L 29  ALT 14 - 54 U/L 18   CBC Latest Ref Rng & Units 07/10/2016  WBC 3.6 - 11.0 K/uL 4.9  Hemoglobin 12.0 - 16.0 g/dL 10.2(L)  Hematocrit 35.0 - 47.0 % 30.4(L)  Platelets 150 - 440 K/uL 246      Assessment and plan- Patient is a 75 y.o. female with a history of gray zone lymphoma status post 5 cycles of mini R CHOP  1. Gray zone lymphoma- counts are okay to proceed with cycle #6 of mini R CHOP  today. CBC CMP LDH 10 days from now. I will order a repeat PET CT scan 2 weeks after cycle #6 of chemotherapy and she will see [name] 3 weeks from now  2. Hypokalemia likely from chemotherapy. We will refill her potassium prescription today as well as add some IV potassium  along with her chemotherapy    Visit Diagnosis 1. 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 Randa Evens, MD, MPH [facility] at [facility] Pager- [phone] 07/21/2016 7:52 AM

## 2016-07-21 NOTE — Progress Notes (Signed)
Patient is here for follow up, she is doing well, she does need a refill on her potassium

## 2016-08-01 ENCOUNTER — Encounter
Admission: RE | Admit: 2016-08-01 | Discharge: 2016-08-01 | Disposition: A | Discharge status: Home / Self Care | Payer: Medicare HMO | Source: Ambulatory Visit | Attending: Oncology | Admitting: Oncology

## 2016-08-01 ENCOUNTER — Inpatient Hospital Stay: Payer: Medicare HMO | Attending: Hematology and Oncology

## 2016-08-01 DIAGNOSIS — C8580 Other specified types of non-Hodgkin lymphoma, unspecified site: Secondary | ICD-10-CM | POA: Insufficient documentation

## 2016-08-01 DIAGNOSIS — R911 Solitary pulmonary nodule: Secondary | ICD-10-CM | POA: Insufficient documentation

## 2016-08-01 DIAGNOSIS — E041 Nontoxic single thyroid nodule: Secondary | ICD-10-CM | POA: Insufficient documentation

## 2016-08-01 DIAGNOSIS — Z7982 Long term (current) use of aspirin: Secondary | ICD-10-CM | POA: Insufficient documentation

## 2016-08-01 DIAGNOSIS — D649 Anemia, unspecified: Secondary | ICD-10-CM | POA: Diagnosis not present

## 2016-08-01 DIAGNOSIS — R200 Anesthesia of skin: Secondary | ICD-10-CM | POA: Insufficient documentation

## 2016-08-01 DIAGNOSIS — I10 Essential (primary) hypertension: Secondary | ICD-10-CM | POA: Diagnosis not present

## 2016-08-01 DIAGNOSIS — R590 Localized enlarged lymph nodes: Secondary | ICD-10-CM | POA: Insufficient documentation

## 2016-08-01 DIAGNOSIS — Z79899 Other long term (current) drug therapy: Secondary | ICD-10-CM | POA: Diagnosis not present

## 2016-08-01 DIAGNOSIS — G8929 Other chronic pain: Secondary | ICD-10-CM | POA: Diagnosis not present

## 2016-08-01 DIAGNOSIS — I2510 Atherosclerotic heart disease of native coronary artery without angina pectoris: Secondary | ICD-10-CM | POA: Insufficient documentation

## 2016-08-01 DIAGNOSIS — C8198 Hodgkin lymphoma, unspecified, lymph nodes of multiple sites: Secondary | ICD-10-CM | POA: Diagnosis present

## 2016-08-01 DIAGNOSIS — M549 Dorsalgia, unspecified: Secondary | ICD-10-CM | POA: Insufficient documentation

## 2016-08-01 DIAGNOSIS — M129 Arthropathy, unspecified: Secondary | ICD-10-CM | POA: Diagnosis not present

## 2016-08-01 DIAGNOSIS — E876 Hypokalemia: Secondary | ICD-10-CM | POA: Insufficient documentation

## 2016-08-01 DIAGNOSIS — C8588 Other specified types of non-Hodgkin lymphoma, lymph nodes of multiple sites: Secondary | ICD-10-CM | POA: Diagnosis not present

## 2016-08-01 DIAGNOSIS — Z801 Family history of malignant neoplasm of trachea, bronchus and lung: Secondary | ICD-10-CM | POA: Diagnosis not present

## 2016-08-01 DIAGNOSIS — C8330 Diffuse large B-cell lymphoma, unspecified site: Secondary | ICD-10-CM

## 2016-08-01 DIAGNOSIS — R112 Nausea with vomiting, unspecified: Secondary | ICD-10-CM | POA: Insufficient documentation

## 2016-08-01 DIAGNOSIS — Z87891 Personal history of nicotine dependence: Secondary | ICD-10-CM | POA: Diagnosis not present

## 2016-08-01 DIAGNOSIS — R202 Paresthesia of skin: Secondary | ICD-10-CM | POA: Insufficient documentation

## 2016-08-01 LAB — CBC WITH DIFFERENTIAL/PLATELET
Basophils Absolute: 0.1 10*3/uL (ref 0–0.1)
Basophils Relative: 2 %
Eosinophils Absolute: 0.1 10*3/uL (ref 0–0.7)
Eosinophils Relative: 3 %
HEMATOCRIT: 31.8 % — AB (ref 35.0–47.0)
HEMOGLOBIN: 10.6 g/dL — AB (ref 12.0–16.0)
Lymphocytes Relative: 25 %
Lymphs Abs: 1.1 10*3/uL (ref 1.0–3.6)
MCH: 29.5 pg (ref 26.0–34.0)
MCHC: 33.3 g/dL (ref 32.0–36.0)
MCV: 88.5 fL (ref 80.0–100.0)
MONOS PCT: 9 %
Monocytes Absolute: 0.4 10*3/uL (ref 0.2–0.9)
NEUTROS ABS: 2.8 10*3/uL (ref 1.4–6.5)
NEUTROS PCT: 61 %
Platelets: 241 10*3/uL (ref 150–440)
RBC: 3.6 MIL/uL — AB (ref 3.80–5.20)
RDW: 18.8 % — ABNORMAL HIGH (ref 11.5–14.5)
WBC: 4.5 10*3/uL (ref 3.6–11.0)

## 2016-08-01 LAB — SAMPLE TO BLOOD BANK

## 2016-08-01 LAB — GLUCOSE, CAPILLARY: Glucose-Capillary: 100 mg/dL — ABNORMAL HIGH (ref 65–99)

## 2016-08-01 MED ORDER — FLUDEOXYGLUCOSE F - 18 (FDG) INJECTION
13.0000 | Freq: Once | INTRAVENOUS | Status: AC | PRN
  Administered 2016-08-01: 13 via INTRAVENOUS

## 2016-08-06 ENCOUNTER — Other Ambulatory Visit: Payer: Self-pay | Admitting: Hematology and Oncology

## 2016-08-06 NOTE — Progress Notes (Signed)
.[facility] day:  08/07/2016   Chief Complaint: Toni Griffith is a 76 y.o. female with a gray zone lymphoma who is seen for review of interval PET scan and assessment following 6 cycles of  mini-RCHOP.  HPI:  The patient was last seen in the medical oncology clinic by me on 06/30/2016.  At that time, she felt good.  She had some transient nausea and vomiting.  Exam was unremarkable.  She received cycle #5 mini-RCHOP.  She saw Dr. Janese Banks in my absence on 07/21/2016.  At that time, she was doing well.  She had some mild tingling and numbness in her hands.  She had chronic back pain.  She received cycle #6 mini-RCHOP.  She underwent PET scan on 08/01/2016.  Imaging revealed no new or progressive findings.  The 9 mm right upper lobe pulmonary nodule remained hypermetabolic with a similar degree of FDG accumulation as before and no substantial interval change in size.  The previously described index lesion posterior left lower lobe measured minimally smaller on CT imaging with very low level FDG uptake.  There was no change in the appearance at site of previous disease at the right iliac wing with FDG uptake stable to slightly decreased in the interval.  Symptomatically, she feels fine.  She has been more active.  Her fingertips are a little numb.   Past Medical History:  Diagnosis Date  . Arthritis   . Cancer ([place])   . Hypertension     Past Surgical History:  Procedure Laterality Date  . CARDIAC SURGERY  1993  . CYST REMOVAL HAND    . PERIPHERAL VASCULAR CATHETERIZATION N/A 04/06/2016   Procedure: Glori Luis Cath Insertion;  Surgeon: Algernon Huxley, MD;  Location: [facility];  Service: Cardiovascular;  Laterality: N/A;    Family History  Problem Relation Age of Onset  . Lung cancer Mother   . Lung cancer Father   . Diabetes Maternal Uncle   . Myasthenia gravis Maternal Grandmother   . Leukemia Maternal Grandfather     Social History:  reports  that she has quit smoking. She has never used smokeless tobacco. She reports that she drinks alcohol. She reports that she does not use drugs.  She smoked 1 1/2 packs per day for 20 years (30 pack year smoking history) until 3 months ago.  She is hearing impaired (deaf).  She lives in [place] with her boyfriend.  Her daughter lives in [place]. The patient is accompanied by her daughter-in-law and the interpreter (sign language) today.  Allergies: No Known Allergies  Current Medications: Current Outpatient Prescriptions  Medication Sig Dispense Refill  . acetaminophen (TYLENOL) 325 MG tablet Take 650 mg by mouth.    Marland Kitchen allopurinol (ZYLOPRIM) 300 MG tablet Take 1 tablet (300 mg total) by mouth daily. 30 tablet 2  . aspirin 325 MG tablet Take 325 mg by mouth daily.    Marland Kitchen atorvastatin (LIPITOR) 80 MG tablet Take 80 mg by mouth.    . B Complex Vitamins (B COMPLEX 1 PO) Take by mouth.    Marland Kitchen FLUoxetine (PROZAC) 40 MG capsule Take 40 mg by mouth.    . gabapentin (NEURONTIN) 100 MG capsule     . KLOR-CON 10 10 MEQ tablet TAKE 1 TABLET (10 MEQ TOTAL) BY MOUTH DAILY. 30 tablet 0  . lidocaine-prilocaine (EMLA) cream Apply cream 1 hour before chemotherapy treatment and place small peive of saran wrap over cream to protect clothing  30 g 1  . naproxen sodium (ANAPROX) 220 MG tablet Take 220 mg by mouth as needed.    . pantoprazole (PROTONIX) 40 MG tablet Take 40 mg by mouth.    . potassium chloride (KLOR-CON 10) 10 MEQ tablet Take 1 tablet (10 mEq total) by mouth daily. 30 tablet 1  . predniSONE (DELTASONE) 20 MG tablet Take 3.5 tablets (70 mg total) by mouth daily with breakfast. Take on days 1-5 of chemotherapy. 20 tablet 5  . promethazine (PHENERGAN) 12.5 MG tablet Take 1 tablet (12.5 mg total) by mouth every 6 (six) hours as needed for nausea or vomiting. 20 tablet 0  . triamterene-hydrochlorothiazide (DYAZIDE) 37.5-25 MG capsule Take by mouth.    . vitamin B-12 (CYANOCOBALAMIN) 500 MCG tablet Take 500 mcg  by mouth daily.    . vitamin E 400 UNIT capsule Take by mouth.    . ondansetron (ZOFRAN ODT) 4 MG disintegrating tablet Take 1 tablet (4 mg total) by mouth every 8 (eight) hours as needed for nausea or vomiting. (Patient not taking: Reported on 08/07/2016) 20 tablet 0  . ondansetron (ZOFRAN) 8 MG tablet Take 1 tablet (8 mg total) by mouth 2 (two) times daily as needed for refractory nausea / vomiting. Start on day 3 after cyclophosphamide chemotherapy. (Patient not taking: Reported on 08/07/2016) 30 tablet 1   No current facility-administered medications for this visit.     Review of Systems:  GENERAL:  Feels fine.  More active.  No fever, chills or sweats.  Weight up 5 pounds. PERFORMANCE STATUS (ECOG):  2 HEENT:  Deaf.  No visual changes, runny nose, sore throat, mouth sores or tenderness. Lungs: No shortness of breath or cough.  No hemoptysis. Cardiac:  No chest pain, palpitations, orthopnea, or PND.  GI: No nausea, vomiting, diarrhea, constipation, melena or hematochezia. GU:  No urgency, frequency, dysuria, or hematuria. Musculoskeletal:  Arthritis flare with cold weather.  No back pain.  No muscle tenderness. Extremities:  No pain or swelling. Skin:  No rashes or skin changes. Neuro:  Little numbness in fingertips. No headache, numbness or weakness, balance or coordination issues. Endocrine:  No diabetes, thyroid issues, hot flashes or night sweats. Psych:  No mood changes, depression or anxiety. Pain:  No focal pain. Review of systems:  All other systems reviewed and found to be negative.  Physical Exam: Blood pressure 140/82, pulse 89, temperature 97.7 F (36.5 C), temperature source Tympanic, resp. rate 18, weight 165 lb 5.5 oz (75 kg). GENERAL:  Elderly woman sitting comfortably in a wheelchair in the exam room in no acute distress.  Slow gait.  She needs assistance onto the table. MENTAL STATUS:  Alert and oriented to person, place and time. HEAD:  Pearline Cables hair.  Normocephalic,  atraumatic, face symmetric, no Cushingoid features. EYES:  Hazel eyes.  Pupils equal round and reactive to light and accomodation.  No conjunctivitis or scleral icterus. ENT:  Oropharynx clear without lesion.  Upper dentures.  Tongue normal. Mucous membranes moist.  RESPIRATORY:  Clear to auscultation without rales, wheezes or rhonchi. CARDIOVASCULAR:  Regular rate and rhythm without murmur, rub or gallop. ABDOMEN:  Soft, non-tender, with active bowel sounds, and no appreciable hepatosplenomegaly.  No masses. SKIN:  No rashes, ulcers or lesions. EXTREMITIES: No edema, no skin discoloration or tenderness.  No palpable cords. LYMPH NODES: No palpable cervical, supraclavicular, axillary or inguinal adenopathy  NEUROLOGICAL:  Lower extremity strength and sensation symmetric.  Bilateral patellar reflexes symmetric.  No foot drop (able to stand  on heels). PSYCH:  Appropriate.   Appointment on 08/07/2016  Component Date Value Ref Range Status  . WBC 08/07/2016 3.8  3.6 - 11.0 K/uL Final  . RBC 08/07/2016 3.52* 3.80 - 5.20 MIL/uL Final  . Hemoglobin 08/07/2016 10.5* 12.0 - 16.0 g/dL Final  . HCT 08/07/2016 31.6* 35.0 - 47.0 % Final  . MCV 08/07/2016 89.9  80.0 - 100.0 fL Final  . MCH 08/07/2016 29.8  26.0 - 34.0 pg Final  . MCHC 08/07/2016 33.1  32.0 - 36.0 g/dL Final  . RDW 08/07/2016 19.2* 11.5 - 14.5 % Final  . Platelets 08/07/2016 318  150 - 440 K/uL Final  . Neutrophils Relative % 08/07/2016 43  % Final  . Neutro Abs 08/07/2016 1.7  1.4 - 6.5 K/uL Final  . Lymphocytes Relative 08/07/2016 31  % Final  . Lymphs Abs 08/07/2016 1.2  1.0 - 3.6 K/uL Final  . Monocytes Relative 08/07/2016 20  % Final  . Monocytes Absolute 08/07/2016 0.8  0.2 - 0.9 K/uL Final  . Eosinophils Relative 08/07/2016 3  % Final  . Eosinophils Absolute 08/07/2016 0.1  0 - 0.7 K/uL Final  . Basophils Relative 08/07/2016 3  % Final  . Basophils Absolute 08/07/2016 0.1  0 - 0.1 K/uL Final  . Sodium 08/07/2016 137  135 -  145 mmol/L Final  . Potassium 08/07/2016 3.3* 3.5 - 5.1 mmol/L Final  . Chloride 08/07/2016 105  101 - 111 mmol/L Final  . CO2 08/07/2016 25  22 - 32 mmol/L Final  . Glucose, Bld 08/07/2016 110* 65 - 99 mg/dL Final  . BUN 08/07/2016 22* 6 - 20 mg/dL Final  . Creatinine, Ser 08/07/2016 1.01* 0.44 - 1.00 mg/dL Final  . Calcium 08/07/2016 8.9  8.9 - 10.3 mg/dL Final  . Total Protein 08/07/2016 6.2* 6.5 - 8.1 g/dL Final  . Albumin 08/07/2016 3.3* 3.5 - 5.0 g/dL Final  . AST 08/07/2016 28  15 - 41 U/L Final  . ALT 08/07/2016 19  14 - 54 U/L Final  . Alkaline Phosphatase 08/07/2016 62  38 - 126 U/L Final  . Total Bilirubin 08/07/2016 0.7  0.3 - 1.2 mg/dL Final  . GFR calc non Af Amer 08/07/2016 54* >60 mL/min Final  . GFR calc Af Amer 08/07/2016 >60  >60 mL/min Final   Comment: (NOTE) The eGFR has been calculated using the CKD EPI equation. This calculation has not been validated in all clinical situations. eGFR's persistently <60 mL/min signify possible Chronic Kidney Disease.   . Anion gap 08/07/2016 7  5 - 15 Final  . LDH 08/07/2016 117  98 - 192 U/L Final    Assessment:  Toni Griffith is a 75 y.o. female with stage IVB B-cell lymphoma, unclassifiable, with features intermediate between diffuse large B-cell lymphoma and classical Hodgkin's lymphoma ("gray zone" lymphoma).  She underwent right iliac wing bone/bone marrow biopsy on 03/08/2016.  Chest, abdomen and pelvic CT scan without contrast on 02/20/2016 revealed a 3.7 cm subpleural mass within the left lower lobe, a 1.1 cm right lower lobe subpleural nodule , 9 mm RUL nodule, and 0.4 cm lingular nodule.  Between the left second, third, and fourth ribs there was 1.9 cm soft tissue mass. There was a 2.5 cm low-attenuation nodule in the left lobe of the thyroid.   There were prominent left axillary nodes (up to 1.0 cm).   There was no definite mediastinal adenopathy. There was a 5.5 x 4.6 cm cystic lesion in the left hepatic lobe.  There was  a 3.1 cm nodule in the right adrenal gland. There was a 1.2 cm right retrocaval lymph node with additional prominent subcentimeter retroperitoneal nodes. There was 1.0 cm left common iliac node. There was a 6.2 x 5.2 cm soft tissue mass within the right ilium.   Head MRI on 02/21/2016 revealed no evidence of malignancy. 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   There was activity in the superior left ocular orbit.  There was a large lesion centered in the right iliac wing with soft tissue extension.  There were hypermetabolic right external iliac lymph nodes  [facility].  She had renal failure and hypercalcemia. She was treated with IVF, calcitonin and Zometa.The patient has a history of coronary artery disease s/p CABG in 1993.  She has no history of heart failure.  She is deaf and requires an interpreter.  Echo on 04/04/2016 revealed an EF of 55-60%.  Hepatitis B and C testing was negative on 03/30/2016.   G6PD assay was normal.  She declined LP with MTX prophylaxis.  She is s/p 6 cycles of mini-RCHOP (04/07/2016 - 07/21/2016).  She has had some transient fingertip numbness.  PET scan on 06/06/2016 revealed significant interval response to therapy.  There was significant decrease in size and FDG uptake associated with left lower lobe lung lesion.  There was resolution of left-sided pleural base metastasis and left axillary and anterior mediastinal nodal metastasis.  There was resolution of previous hypermetabolic soft tissue metastasis/nodal metastasis posterior to the IVC.  There was marked improvement in hypermetabolic metastasis involving the right iliac wing.  There was persistent right upper lobe lung  nodule which exhibits intense radiotracer uptake which may represent a focus of metastatic disease or synchronous primary lung neoplasm.  PET scan on 08/01/2016 revealed no new or progressive findings.  The 9 mm RUL pulmonary nodule remained hypermetabolic (SUV 16.1, previously 7.3) and no change in size.  The index lesion in the posterior left lower lobe was minimally smaller on CT imaging with very low level FDG uptake (SUV 1.6).  There was no change in the appearance at site of previous disease at the right iliac wing (SUV 1.9 compared to 2.6 previously).  She has a normocytic anemia.  Labs on 04/17/2016 revealed an elevated ferritin (444), iron saturation (11%), low TIBC (240), B12 (2409), and folate (10.8).  Symptomatically, she feels good.  She denies any B symptoms.  She has some numbness and tingling in her fingertips.  Exam is unremarkable. She has hypokalemia (3.3).  Plan: 1.  Labs today:  CBC with diff,CMP, LDH, uric acid. 2.  Review PET scan.  Excellent response to therapy.  Disease relatively stable.  No clear benefit for additional cycles of mini-CHOP.  Unclear 9 mm RUL lesion (stable since diagnosis; doubt lymphoma; suspect different etiology-? low grade lung cancer).  Consider consolidation radiation therapy to right iliac wing. 3.  Increase potassium to 10 meq BID. 4.  Present at tumor board on 08/10/2016. 5.  Contact [facility] regarding second opinion. 6.  Contact patient with discussion from tumor board: [phone]. 7.  RTC after above.  Addendum:  Tumor board on 08/10/2016 recommended following pulmonary lesion (too small to biopsy).  Radiation to residual  bone disease (iliac wing).   Lequita Asal, MD  08/07/2016, 3:16 PM

## 2016-08-07 ENCOUNTER — Inpatient Hospital Stay: Payer: Medicare HMO

## 2016-08-07 ENCOUNTER — Inpatient Hospital Stay (HOSPITAL_BASED_OUTPATIENT_CLINIC_OR_DEPARTMENT_OTHER): Payer: Medicare HMO | Admitting: Hematology and Oncology

## 2016-08-07 VITALS — BP 117/64 | HR 89 | Temp 97.7°F | Resp 18 | Wt 165.3 lb

## 2016-08-07 DIAGNOSIS — Z7982 Long term (current) use of aspirin: Secondary | ICD-10-CM

## 2016-08-07 DIAGNOSIS — E876 Hypokalemia: Secondary | ICD-10-CM

## 2016-08-07 DIAGNOSIS — C8198 Hodgkin lymphoma, unspecified, lymph nodes of multiple sites: Secondary | ICD-10-CM

## 2016-08-07 DIAGNOSIS — I10 Essential (primary) hypertension: Secondary | ICD-10-CM

## 2016-08-07 DIAGNOSIS — D649 Anemia, unspecified: Secondary | ICD-10-CM | POA: Diagnosis not present

## 2016-08-07 DIAGNOSIS — M549 Dorsalgia, unspecified: Secondary | ICD-10-CM

## 2016-08-07 DIAGNOSIS — C8580 Other specified types of non-Hodgkin lymphoma, unspecified site: Principal | ICD-10-CM

## 2016-08-07 DIAGNOSIS — G8929 Other chronic pain: Secondary | ICD-10-CM

## 2016-08-07 DIAGNOSIS — R911 Solitary pulmonary nodule: Secondary | ICD-10-CM

## 2016-08-07 DIAGNOSIS — R590 Localized enlarged lymph nodes: Secondary | ICD-10-CM

## 2016-08-07 DIAGNOSIS — R112 Nausea with vomiting, unspecified: Secondary | ICD-10-CM | POA: Diagnosis not present

## 2016-08-07 DIAGNOSIS — C7951 Secondary malignant neoplasm of bone: Secondary | ICD-10-CM

## 2016-08-07 DIAGNOSIS — M129 Arthropathy, unspecified: Secondary | ICD-10-CM

## 2016-08-07 DIAGNOSIS — C8588 Other specified types of non-Hodgkin lymphoma, lymph nodes of multiple sites: Secondary | ICD-10-CM

## 2016-08-07 DIAGNOSIS — C8330 Diffuse large B-cell lymphoma, unspecified site: Secondary | ICD-10-CM

## 2016-08-07 DIAGNOSIS — R200 Anesthesia of skin: Secondary | ICD-10-CM

## 2016-08-07 DIAGNOSIS — I2510 Atherosclerotic heart disease of native coronary artery without angina pectoris: Secondary | ICD-10-CM

## 2016-08-07 DIAGNOSIS — Z79899 Other long term (current) drug therapy: Secondary | ICD-10-CM

## 2016-08-07 DIAGNOSIS — Z87891 Personal history of nicotine dependence: Secondary | ICD-10-CM

## 2016-08-07 DIAGNOSIS — Z801 Family history of malignant neoplasm of trachea, bronchus and lung: Secondary | ICD-10-CM

## 2016-08-07 DIAGNOSIS — E041 Nontoxic single thyroid nodule: Secondary | ICD-10-CM

## 2016-08-07 DIAGNOSIS — R202 Paresthesia of skin: Secondary | ICD-10-CM

## 2016-08-07 LAB — CBC WITH DIFFERENTIAL/PLATELET
BASOS PCT: 3 %
Basophils Absolute: 0.1 10*3/uL (ref 0–0.1)
Eosinophils Absolute: 0.1 10*3/uL (ref 0–0.7)
Eosinophils Relative: 3 %
HEMATOCRIT: 31.6 % — AB (ref 35.0–47.0)
HEMOGLOBIN: 10.5 g/dL — AB (ref 12.0–16.0)
LYMPHS ABS: 1.2 10*3/uL (ref 1.0–3.6)
LYMPHS PCT: 31 %
MCH: 29.8 pg (ref 26.0–34.0)
MCHC: 33.1 g/dL (ref 32.0–36.0)
MCV: 89.9 fL (ref 80.0–100.0)
MONO ABS: 0.8 10*3/uL (ref 0.2–0.9)
MONOS PCT: 20 %
NEUTROS PCT: 43 %
Neutro Abs: 1.7 10*3/uL (ref 1.4–6.5)
Platelets: 318 10*3/uL (ref 150–440)
RBC: 3.52 MIL/uL — ABNORMAL LOW (ref 3.80–5.20)
RDW: 19.2 % — ABNORMAL HIGH (ref 11.5–14.5)
WBC: 3.8 10*3/uL (ref 3.6–11.0)

## 2016-08-07 LAB — COMPREHENSIVE METABOLIC PANEL
ALBUMIN: 3.3 g/dL — AB (ref 3.5–5.0)
ALK PHOS: 62 U/L (ref 38–126)
ALT: 19 U/L (ref 14–54)
AST: 28 U/L (ref 15–41)
Anion gap: 7 (ref 5–15)
BILIRUBIN TOTAL: 0.7 mg/dL (ref 0.3–1.2)
BUN: 22 mg/dL — AB (ref 6–20)
CALCIUM: 8.9 mg/dL (ref 8.9–10.3)
CO2: 25 mmol/L (ref 22–32)
Chloride: 105 mmol/L (ref 101–111)
Creatinine, Ser: 1.01 mg/dL — ABNORMAL HIGH (ref 0.44–1.00)
GFR calc Af Amer: >60 mL/min (ref >60)
GFR, EST NON AFRICAN AMERICAN: 54 mL/min — AB (ref >60)
GLUCOSE: 110 mg/dL — AB (ref 65–99)
Potassium: 3.3 mmol/L — ABNORMAL LOW (ref 3.5–5.1)
Sodium: 137 mmol/L (ref 135–145)
TOTAL PROTEIN: 6.2 g/dL — AB (ref 6.5–8.1)

## 2016-08-07 LAB — LACTATE DEHYDROGENASE: LDH: 117 U/L (ref 98–192)

## 2016-08-07 MED ORDER — POTASSIUM CHLORIDE ER 10 MEQ PO TBCR: 10.0000 meq | EXTENDED_RELEASE_TABLET | Freq: Two times a day (BID) | ORAL | 0 refills | Status: DC

## 2016-08-07 NOTE — Progress Notes (Signed)
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  Also wants to know if she is to continue K+?

## 2016-08-11 ENCOUNTER — Telehealth: Payer: Self-pay | Admitting: *Deleted

## 2016-08-11 ENCOUNTER — Other Ambulatory Visit: Payer: Medicare HMO

## 2016-08-11 ENCOUNTER — Other Ambulatory Visit: Payer: Self-pay | Admitting: *Deleted

## 2016-08-11 ENCOUNTER — Ambulatory Visit: Payer: Medicare HMO | Admitting: Hematology and Oncology

## 2016-08-11 NOTE — Telephone Encounter (Signed)
Called patient's daughter [name] per [name]

## 2016-08-11 NOTE — Telephone Encounter (Signed)
Called and said that she was supposed to get a call from [place] yesterday after conference and has not heard from [place]. Pease advise

## 2016-08-11 NOTE — Telephone Encounter (Signed)
Daughter notified of tumor board recommendations of consolidated radiation therapy to the bone and to follow the lung with serial scans r/t it is too small at this time to perform biopsy.  Voiced understanding, instructed that [name] would call them back after she hears back from [facility]. Voiced understanding.

## 2016-08-21 ENCOUNTER — Telehealth: Payer: Self-pay | Admitting: *Deleted

## 2016-08-21 NOTE — Telephone Encounter (Signed)
Patients daughter called to inquire about the plan for referral to [facility].

## 2016-08-21 NOTE — Telephone Encounter (Signed)
  Toni Griffith,  Please call patient.  I am waiting to hear back from Dr. Evelene Croon. I mentioned this patient to you this morning.  M

## 2016-08-21 NOTE — Telephone Encounter (Signed)
Daughter called to inform her that [name] has called Dr. Evelene Croon at [facility] and that we will call her back at that time, voiced understanding.

## 2016-08-21 NOTE — Telephone Encounter (Signed)
[name] please advise

## 2016-08-21 NOTE — Telephone Encounter (Signed)
Called daughter and let her know that we will call her when [name] hears back from Dr. Evelene Croon.

## 2016-08-24 ENCOUNTER — Telehealth: Payer: Self-pay | Admitting: *Deleted

## 2016-08-24 NOTE — Telephone Encounter (Signed)
Called to ask Dr Mike Gip "what is the next step?" She cancelled her last appt and has nothing scheduled for the future

## 2016-08-24 NOTE — Telephone Encounter (Signed)
Called daughter to discuss that [name] hasn't heard back from [facility] and she would EMail them again and let her know about [facility] information.

## 2016-08-24 NOTE — Telephone Encounter (Signed)
Daughter informed that [name] has not heard from [facility] and that she would email again.  We will call daughter back with information from [place].

## 2016-08-30 ENCOUNTER — Telehealth: Payer: Self-pay | Admitting: *Deleted

## 2016-08-30 NOTE — Telephone Encounter (Signed)
Asking about appt for treatments, Have you heard back from [facility] yet?

## 2016-08-30 NOTE — Telephone Encounter (Signed)
Called Daughter [name] and discussed that Toni Griffith had forwarded her moms information to the [place] specialist and [name] was hoping to hear back from them soon.  We will call back with the information when [name] hears back from them.

## 2016-09-01 ENCOUNTER — Telehealth: Payer: Self-pay | Admitting: *Deleted

## 2016-09-01 NOTE — Telephone Encounter (Signed)
Asking if we have heard anything from [facility] yet. Please return call [phone]

## 2016-09-01 NOTE — Telephone Encounter (Signed)
[name] to call patient's daughter

## 2016-09-03 ENCOUNTER — Other Ambulatory Visit: Payer: Self-pay | Admitting: Hematology and Oncology

## 2016-09-04 ENCOUNTER — Telehealth: Payer: Self-pay | Admitting: Hematology and Oncology

## 2016-09-04 ENCOUNTER — Telehealth: Payer: Self-pay | Admitting: *Deleted

## 2016-09-04 NOTE — Telephone Encounter (Signed)
Called patient's daughter, [name], to ask if patient is willing to see Dr. Thera Flake @ [facility] for second opinion.  Per [name] tumor board recommends radiating the bone lesion but not the lung.  Daughter is in agreement to going to [facility] for second opinion so we can go ahead and send referral.  She wanted to know if they have an office in [place] they can go to, if so please let them know when appointment is made.  Also asked that we contact her via cell phone [phone].

## 2016-09-04 NOTE — Telephone Encounter (Signed)
Re:  [facility] Consult  I called the number for [facility], [phone], regarding the second opinion at [facility].  She was not available.  I believe I spoke to her husband.  I have been unable to connect directly to Dr. Thera Flake.  She would be willing to see the patient for second opinion.    She agrees the lung lesion likely something different.  Would observe.  She did not address consolidative radiation to remaining bone lesion. She would not pursue transplant at this point.  Lequita Asal, MD

## 2016-09-05 ENCOUNTER — Other Ambulatory Visit: Payer: Self-pay | Admitting: *Deleted

## 2016-09-05 ENCOUNTER — Encounter: Payer: Self-pay | Admitting: Hematology and Oncology

## 2016-09-05 DIAGNOSIS — C8580 Other specified types of non-Hodgkin lymphoma, unspecified site: Principal | ICD-10-CM

## 2016-09-05 DIAGNOSIS — C8330 Diffuse large B-cell lymphoma, unspecified site: Secondary | ICD-10-CM

## 2016-09-05 DIAGNOSIS — C3490 Malignant neoplasm of unspecified part of unspecified bronchus or lung: Secondary | ICD-10-CM | POA: Insufficient documentation

## 2016-09-05 NOTE — Telephone Encounter (Signed)
  She is on the main campus.  M

## 2016-09-06 NOTE — Telephone Encounter (Signed)
Melinda with [facility] called to say that Dr. Reita Chard can see patient this Friday.  Patient arrival time should be 9 am.  Rip Harbour is sending package out to patient.  Also has left message with daughter, [name] on her cell phone.

## 2016-09-06 NOTE — Telephone Encounter (Signed)
Referral faxed to Dr. Thera Flake Tuesday, February 6th.

## 2016-09-08 ENCOUNTER — Inpatient Hospital Stay: Payer: Medicare HMO

## 2016-09-14 ENCOUNTER — Other Ambulatory Visit: Payer: Self-pay | Admitting: Hematology and Oncology

## 2016-09-14 ENCOUNTER — Telehealth: Payer: Self-pay | Admitting: Hematology and Oncology

## 2016-09-14 DIAGNOSIS — C8580 Other specified types of non-Hodgkin lymphoma, unspecified site: Principal | ICD-10-CM

## 2016-09-14 DIAGNOSIS — C8330 Diffuse large B-cell lymphoma, unspecified site: Secondary | ICD-10-CM

## 2016-09-14 DIAGNOSIS — C7951 Secondary malignant neoplasm of bone: Secondary | ICD-10-CM

## 2016-09-14 NOTE — Telephone Encounter (Signed)
Re:  Direction of therapy  Message left for Hazle Quant [phone]) to return call.  Patient seen by Dr. Reita Chard.  Plan is in agreement with out prior discussions in clinic.  Lung nodule too small to biopsy.  Plan for repeat chest CT in 3 months.  Consolidative radiation planned for small area still positive on PET scan on right iliac wing.  Data is controversial with some studies showing improved overall survival (OS), while others not.    Radiation limited to the area of involvement would be unlikely to have sufficient increased toxicities.  Care to be taken to protect rest of pelvic bone to decrease the chance of MDS, hypoplastic marrow or difficulty harvesting stem cells if transplant pursued in the future.  Lequita Asal, MD

## 2016-09-15 ENCOUNTER — Telehealth: Payer: Self-pay | Admitting: *Deleted

## 2016-09-15 NOTE — Telephone Encounter (Signed)
Discussed with daughter the information from [facility] and Dr. Kem Parkinson decision to make an appt with dr. Donella Stade, daughter given information about appt, voiced understanding.

## 2016-09-26 ENCOUNTER — Ambulatory Visit
Admission: RE | Admit: 2016-09-26 | Discharge: 2016-09-26 | Disposition: A | Discharge status: Home / Self Care | Payer: Medicare HMO | Source: Ambulatory Visit | Attending: Radiation Oncology | Admitting: Radiation Oncology

## 2016-09-26 ENCOUNTER — Encounter: Payer: Self-pay | Admitting: Radiation Oncology

## 2016-09-26 VITALS — BP 143/90 | HR 94 | Temp 96.0°F | Wt 172.3 lb

## 2016-09-26 DIAGNOSIS — Z806 Family history of leukemia: Secondary | ICD-10-CM | POA: Diagnosis not present

## 2016-09-26 DIAGNOSIS — Z79899 Other long term (current) drug therapy: Secondary | ICD-10-CM | POA: Diagnosis not present

## 2016-09-26 DIAGNOSIS — C8580 Other specified types of non-Hodgkin lymphoma, unspecified site: Secondary | ICD-10-CM | POA: Insufficient documentation

## 2016-09-26 DIAGNOSIS — I10 Essential (primary) hypertension: Secondary | ICD-10-CM | POA: Insufficient documentation

## 2016-09-26 DIAGNOSIS — M129 Arthropathy, unspecified: Secondary | ICD-10-CM | POA: Insufficient documentation

## 2016-09-26 DIAGNOSIS — R634 Abnormal weight loss: Secondary | ICD-10-CM | POA: Insufficient documentation

## 2016-09-26 DIAGNOSIS — Z510 Encounter for antineoplastic radiation therapy: Secondary | ICD-10-CM | POA: Diagnosis present

## 2016-09-26 DIAGNOSIS — Z801 Family history of malignant neoplasm of trachea, bronchus and lung: Secondary | ICD-10-CM | POA: Insufficient documentation

## 2016-09-26 DIAGNOSIS — C8330 Diffuse large B-cell lymphoma, unspecified site: Secondary | ICD-10-CM

## 2016-09-26 DIAGNOSIS — Z87891 Personal history of nicotine dependence: Secondary | ICD-10-CM | POA: Diagnosis not present

## 2016-09-26 DIAGNOSIS — Z7951 Long term (current) use of inhaled steroids: Secondary | ICD-10-CM | POA: Insufficient documentation

## 2016-09-26 DIAGNOSIS — C7951 Secondary malignant neoplasm of bone: Secondary | ICD-10-CM

## 2016-09-26 DIAGNOSIS — Z7982 Long term (current) use of aspirin: Secondary | ICD-10-CM | POA: Insufficient documentation

## 2016-09-26 NOTE — Consult Note (Signed)
NEW PATIENT EVALUATION  Name: Nell Gales  MRN: [id number]  Date:   09/26/2016     DOB: 19-Aug-1940   This 76 y.o. 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  REFERRING PHYSICIAN: Dionne Bucy., MD  CHIEF COMPLAINT:  Chief Complaint  Patient presents with  . 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 On initial presentation on 02/20/2016 showed a 3.7 cm subpleural mass within the left lower lobe. There is also 1.1 cm right lower lobe subpleural nodule a 9 mm nodule in the right upper lobe. PET CT scan showing hypermetabolic left lower lobe mass with bilateral pleural and pulmonary parenchymal nodular lesions. 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 She was started on an completed 6 cycles of many R CHOP. Repeat PET CT scan on Jennifer second 2018 showed excellent response there was residual 9 mm right upper lobe hypermetabolic mass which may be a separate pulmonary carcinoma. She is seen today and is doing well she was presented at our weekly tumor conference with recommendation to treat the right iliac wing. PET CT scan showed no  change in appearance at the site of previous disease of the right iliac wing. She specifically denies fever chills or night sweats. Weight has been stable.   PLANNED TREATMENT REGIMEN: Radiation to right iliac wing  PAST MEDICAL HISTORY:  has a past medical history of Arthritis; Cancer ([place]); and Hypertension.    PAST SURGICAL HISTORY:  Past Surgical History:  Procedure Laterality Date  . CARDIAC SURGERY  1993  . CYST REMOVAL HAND    . PERIPHERAL VASCULAR CATHETERIZATION N/A 04/06/2016   Procedure: Glori Luis Cath Insertion;  Surgeon: Algernon Huxley, MD;  Location: [place] [facility];  Service: Cardiovascular;  Laterality: N/A;    FAMILY HISTORY: family history includes Diabetes in her maternal uncle; Leukemia in her maternal grandfather; Lung cancer in her father and mother; Myasthenia gravis in her maternal grandmother.  SOCIAL HISTORY:  reports that she has quit smoking. She has never used smokeless tobacco. She reports that she drinks alcohol. She reports that she does not use drugs.  ALLERGIES: Patient has no known allergies.  MEDICATIONS:  Current Outpatient Prescriptions  Medication Sig Dispense Refill  . acetaminophen (TYLENOL) 325 MG tablet Take 650 mg by mouth.    Marland Kitchen allopurinol (ZYLOPRIM) 300 MG tablet Take 1 tablet (300 mg total) by mouth daily. 30 tablet 2  . aspirin 325 MG tablet Take 325 mg by mouth daily.    Marland Kitchen atorvastatin (LIPITOR) 80 MG tablet Take 80 mg by mouth.    . B Complex Vitamins (B COMPLEX 1 PO) Take by mouth.    Marland Kitchen FLUoxetine (PROZAC) 40 MG capsule Take 40 mg by mouth.    . gabapentin (  NEURONTIN) 100 MG capsule     . KLOR-CON 10 10 MEQ tablet TAKE 1 TABLET (10 MEQ TOTAL) BY MOUTH 2 (TWO) TIMES DAILY. 60 tablet 0  . lidocaine-prilocaine (EMLA) cream Apply cream 1 hour before chemotherapy treatment and place small peive of saran wrap over cream to protect clothing 30 g 1  . naproxen sodium (ANAPROX) 220 MG tablet Take 220 mg by mouth as needed.    . ondansetron  (ZOFRAN ODT) 4 MG disintegrating tablet Take 1 tablet (4 mg total) by mouth every 8 (eight) hours as needed for nausea or vomiting. (Patient not taking: Reported on 08/07/2016) 20 tablet 0  . ondansetron (ZOFRAN) 8 MG tablet Take 1 tablet (8 mg total) by mouth 2 (two) times daily as needed for refractory nausea / vomiting. Start on day 3 after cyclophosphamide chemotherapy. (Patient not taking: Reported on 08/07/2016) 30 tablet 1  . pantoprazole (PROTONIX) 40 MG tablet Take 40 mg by mouth.    . potassium chloride (KLOR-CON 10) 10 MEQ tablet Take 1 tablet (10 mEq total) by mouth daily. 30 tablet 1  . predniSONE (DELTASONE) 20 MG tablet Take 3.5 tablets (70 mg total) by mouth daily with breakfast. Take on days 1-5 of chemotherapy. 20 tablet 5  . promethazine (PHENERGAN) 12.5 MG tablet Take 1 tablet (12.5 mg total) by mouth every 6 (six) hours as needed for nausea or vomiting. 20 tablet 0  . triamterene-hydrochlorothiazide (DYAZIDE) 37.5-25 MG capsule Take by mouth.    . vitamin B-12 (CYANOCOBALAMIN) 500 MCG tablet Take 500 mcg by mouth daily.    . vitamin E 400 UNIT capsule Take by mouth.     No current facility-administered medications for this encounter.     ECOG PERFORMANCE STATUS:  0 - Asymptomatic  REVIEW OF SYSTEMS:  Patient denies any weight loss, fatigue, weakness, fever, chills or night sweats. Patient denies any loss of vision, blurred vision. Patient denies any ringing  of the ears or hearing loss. No irregular heartbeat. Patient denies heart murmur or history of fainting. Patient denies any chest pain or pain radiating to her upper extremities. Patient denies any shortness of breath, difficulty breathing at night, cough or hemoptysis. Patient denies any swelling in the lower legs. Patient denies any nausea vomiting, vomiting of blood, or coffee ground material in the vomitus. Patient denies any stomach pain. Patient states has had normal bowel movements no significant constipation or diarrhea.  Patient denies any dysuria, hematuria or significant nocturia. Patient denies any problems walking, swelling in the joints or loss of balance. Patient denies any skin changes, loss of hair or loss of weight. Patient denies any excessive worrying or anxiety or significant depression. Patient denies any problems with insomnia. Patient denies excessive thirst, polyuria, polydipsia. Patient denies any swollen glands, patient denies easy bruising or easy bleeding. Patient denies any recent infections, allergies or URI. Patient "s visual fields have not changed significantly in recent time.    PHYSICAL EXAM: BP (!) 143/90   Pulse 94   Temp (!) 96 F (35.6 C)   Wt 172 lb 4.6 oz (78.1 kg)   BMI 30.52 kg/m  Well-developed well-nourished patient in NAD. HEENT reveals PERLA, EOMI, discs not visualized.  Oral cavity is clear. No oral mucosal lesions are identified. Neck is clear without evidence of cervical or supraclavicular adenopathy. Lungs are clear to A&P. Cardiac examination is essentially unremarkable with regular rate and rhythm without murmur rub or thrill. Abdomen is benign with no organomegaly or masses noted. Motor sensory  and DTR levels are equal and symmetric in the upper and lower extremities. Cranial nerves II through XII are grossly intact. Proprioception is intact. No peripheral adenopathy or edema is identified. No motor or sensory levels are noted. Crude visual fields are within normal range.  LABORATORY DATA: Pathology reports reviewed    RADIOLOGY RESULTS: Serial PET/CT scans reviewed   IMPRESSION: [place] zone lymphoma diffuse in 76 year old female with excellent response to R CHOP chemotherapy  PLAN: At this time I to go ahead with revision therapy to area of previous involvement of the large mass in the right iliac wing. I believe a dose of 3000 cGy over 3 weeks would be sufficient to control disease in this region. Also believe observing her right upper lobe hyperbolic mass is  indicated based on its small size. If after 6 months increases in size a needle biopsy can be obtained and it was can be treated with SB RT should be a primary lung cancer. Risks and benefits of treatment including possible diarrhea dysuria secondary to slight radiation cystitis fatigue alteration of blood counts skin reaction all were discussed in detail with the patient and her family. She has consented to treatment 5 personally ordered CT simulation.There will be extra effort by both professional staff as well as technical staff to coordinate and manage concurrent chemoradiation and ensuing side effects during her treatments.  I would like to take this opportunity to thank you for allowing me to participate in the care of your patient.Armstead Peaks., MD

## 2016-10-03 ENCOUNTER — Other Ambulatory Visit: Payer: Self-pay | Admitting: Hematology and Oncology

## 2016-10-03 ENCOUNTER — Other Ambulatory Visit: Payer: Self-pay | Admitting: *Deleted

## 2016-10-03 ENCOUNTER — Ambulatory Visit
Admission: RE | Admit: 2016-10-03 | Discharge: 2016-10-03 | Disposition: A | Discharge status: Home / Self Care | Payer: Medicare HMO | Source: Ambulatory Visit | Attending: Radiation Oncology | Admitting: Radiation Oncology

## 2016-10-03 DIAGNOSIS — C8330 Diffuse large B-cell lymphoma, unspecified site: Secondary | ICD-10-CM

## 2016-10-03 DIAGNOSIS — C8580 Other specified types of non-Hodgkin lymphoma, unspecified site: Principal | ICD-10-CM

## 2016-10-03 DIAGNOSIS — Z510 Encounter for antineoplastic radiation therapy: Secondary | ICD-10-CM | POA: Diagnosis not present

## 2016-10-04 ENCOUNTER — Other Ambulatory Visit: Payer: Self-pay | Admitting: *Deleted

## 2016-10-04 ENCOUNTER — Telehealth: Payer: Self-pay | Admitting: *Deleted

## 2016-10-04 DIAGNOSIS — C8330 Diffuse large B-cell lymphoma, unspecified site: Secondary | ICD-10-CM

## 2016-10-04 DIAGNOSIS — C8580 Other specified types of non-Hodgkin lymphoma, unspecified site: Principal | ICD-10-CM

## 2016-10-04 NOTE — Telephone Encounter (Signed)
Comprehensive metabolic panel  Order: [id number]  Status:  Final result Visible to patient:  No (Not Released) Next appt:  10/10/2016 at 11:00 AM in Radiation Oncology ([facility]) Dx:  Diffuse large cell non-Hodgkin's lymp...    Ref Range & Units 37moago 234mogo 76m49moo   Potassium 3.5 - 5.1 mmol/L 3.3   3.3   3.5

## 2016-10-05 ENCOUNTER — Telehealth: Payer: Self-pay | Admitting: *Deleted

## 2016-10-05 NOTE — Telephone Encounter (Signed)
attempted to call patient and daughter, no answer.

## 2016-10-06 ENCOUNTER — Other Ambulatory Visit: Payer: Self-pay | Admitting: *Deleted

## 2016-10-06 NOTE — Telephone Encounter (Signed)
Left message for Toni Griffith patients daughter that the refill for kclor con had been processed.

## 2016-10-09 DIAGNOSIS — Z510 Encounter for antineoplastic radiation therapy: Secondary | ICD-10-CM | POA: Diagnosis not present

## 2016-10-10 ENCOUNTER — Ambulatory Visit
Admission: RE | Admit: 2016-10-10 | Discharge: 2016-10-10 | Disposition: A | Discharge status: Home / Self Care | Payer: Medicare HMO | Source: Ambulatory Visit | Attending: Radiation Oncology | Admitting: Radiation Oncology

## 2016-10-10 DIAGNOSIS — Z510 Encounter for antineoplastic radiation therapy: Secondary | ICD-10-CM | POA: Diagnosis not present

## 2016-10-11 ENCOUNTER — Ambulatory Visit
Admission: RE | Admit: 2016-10-11 | Discharge: 2016-10-11 | Disposition: A | Discharge status: Home / Self Care | Payer: Medicare HMO | Source: Ambulatory Visit | Attending: Radiation Oncology | Admitting: Radiation Oncology

## 2016-10-11 DIAGNOSIS — Z510 Encounter for antineoplastic radiation therapy: Secondary | ICD-10-CM | POA: Diagnosis not present

## 2016-10-12 ENCOUNTER — Ambulatory Visit
Admission: RE | Admit: 2016-10-12 | Discharge: 2016-10-12 | Disposition: A | Discharge status: Home / Self Care | Payer: Medicare HMO | Source: Ambulatory Visit | Attending: Radiation Oncology | Admitting: Radiation Oncology

## 2016-10-12 DIAGNOSIS — Z510 Encounter for antineoplastic radiation therapy: Secondary | ICD-10-CM | POA: Diagnosis not present

## 2016-10-13 ENCOUNTER — Ambulatory Visit
Admission: RE | Admit: 2016-10-13 | Discharge: 2016-10-13 | Disposition: A | Discharge status: Home / Self Care | Payer: Medicare HMO | Source: Ambulatory Visit | Attending: Radiation Oncology | Admitting: Radiation Oncology

## 2016-10-13 DIAGNOSIS — Z510 Encounter for antineoplastic radiation therapy: Secondary | ICD-10-CM | POA: Diagnosis not present

## 2016-10-16 ENCOUNTER — Ambulatory Visit
Admission: RE | Admit: 2016-10-16 | Discharge: 2016-10-16 | Disposition: A | Discharge status: Home / Self Care | Payer: Medicare HMO | Source: Ambulatory Visit | Attending: Radiation Oncology | Admitting: Radiation Oncology

## 2016-10-16 DIAGNOSIS — Z510 Encounter for antineoplastic radiation therapy: Secondary | ICD-10-CM | POA: Diagnosis not present

## 2016-10-17 ENCOUNTER — Ambulatory Visit
Admission: RE | Admit: 2016-10-17 | Discharge: 2016-10-17 | Disposition: A | Discharge status: Home / Self Care | Payer: Medicare HMO | Source: Ambulatory Visit | Attending: Radiation Oncology | Admitting: Radiation Oncology

## 2016-10-17 DIAGNOSIS — Z510 Encounter for antineoplastic radiation therapy: Secondary | ICD-10-CM | POA: Diagnosis not present

## 2016-10-18 ENCOUNTER — Ambulatory Visit
Admission: RE | Admit: 2016-10-18 | Discharge: 2016-10-18 | Disposition: A | Discharge status: Home / Self Care | Payer: Medicare HMO | Source: Ambulatory Visit | Attending: Radiation Oncology | Admitting: Radiation Oncology

## 2016-10-18 DIAGNOSIS — Z510 Encounter for antineoplastic radiation therapy: Secondary | ICD-10-CM | POA: Diagnosis not present

## 2016-10-19 ENCOUNTER — Telehealth: Payer: Self-pay | Admitting: *Deleted

## 2016-10-19 ENCOUNTER — Inpatient Hospital Stay: Payer: Medicare HMO | Attending: Radiation Oncology

## 2016-10-19 ENCOUNTER — Ambulatory Visit
Admission: RE | Admit: 2016-10-19 | Discharge: 2016-10-19 | Disposition: A | Discharge status: Home / Self Care | Payer: Medicare HMO | Source: Ambulatory Visit | Attending: Radiation Oncology | Admitting: Radiation Oncology

## 2016-10-19 DIAGNOSIS — C8330 Diffuse large B-cell lymphoma, unspecified site: Secondary | ICD-10-CM | POA: Diagnosis not present

## 2016-10-19 DIAGNOSIS — C8580 Other specified types of non-Hodgkin lymphoma, unspecified site: Secondary | ICD-10-CM

## 2016-10-19 DIAGNOSIS — Z510 Encounter for antineoplastic radiation therapy: Secondary | ICD-10-CM | POA: Diagnosis not present

## 2016-10-19 DIAGNOSIS — Z95828 Presence of other vascular implants and grafts: Secondary | ICD-10-CM

## 2016-10-19 LAB — CBC
HEMATOCRIT: 32.5 % — AB (ref 35.0–47.0)
Hemoglobin: 11.1 g/dL — ABNORMAL LOW (ref 12.0–16.0)
MCH: 30.7 pg (ref 26.0–34.0)
MCHC: 34.1 g/dL (ref 32.0–36.0)
MCV: 90 fL (ref 80.0–100.0)
Platelets: 326 10*3/uL (ref 150–440)
RBC: 3.61 MIL/uL — AB (ref 3.80–5.20)
RDW: 17.3 % — AB (ref 11.5–14.5)
WBC: 7 10*3/uL (ref 3.6–11.0)

## 2016-10-19 LAB — BASIC METABOLIC PANEL
Anion gap: 5 (ref 5–15)
BUN: 21 mg/dL — ABNORMAL HIGH (ref 6–20)
CO2: 26 mmol/L (ref 22–32)
Calcium: 8.8 mg/dL — ABNORMAL LOW (ref 8.9–10.3)
Chloride: 105 mmol/L (ref 101–111)
Creatinine, Ser: 1.08 mg/dL — ABNORMAL HIGH (ref 0.44–1.00)
GFR calc Af Amer: 58 mL/min — ABNORMAL LOW (ref >60)
GFR calc non Af Amer: 50 mL/min — ABNORMAL LOW (ref >60)
Glucose, Bld: 119 mg/dL — ABNORMAL HIGH (ref 65–99)
Potassium: 3.6 mmol/L (ref 3.5–5.1)
Sodium: 136 mmol/L (ref 135–145)

## 2016-10-19 MED ORDER — HEPARIN SOD (PORK) LOCK FLUSH 100 UNIT/ML IV SOLN
500.0000 [IU] | Freq: Once | INTRAVENOUS | Status: AC
  Administered 2016-10-19: 500 [IU] via INTRAVENOUS

## 2016-10-19 MED ORDER — SODIUM CHLORIDE 0.9% FLUSH
10.0000 mL | INTRAVENOUS | Status: DC | PRN
  Administered 2016-10-19: 10 mL via INTRAVENOUS
  Filled 2016-10-19: qty 10

## 2016-10-19 NOTE — Telephone Encounter (Signed)
Returned call to [place], advised of Dr [employer] response and he stated when she went to [facility] they told her to wean off the potassium, I explained that  Dr Mike Gip felt she needs to continue it to keep it nml. He stated he will have her restart it

## 2016-10-19 NOTE — Telephone Encounter (Signed)
  Potassium normal.  If she is still taking potassium, she should continue because it is keeping her potassium normal.  Jerilynn Mages

## 2016-10-19 NOTE — Telephone Encounter (Signed)
Asking for Potassium results form today. Any Changes? Please advise   Basic metabolic panel  Order: [id number]  Status:  Final result  Visible to patient:  No (Not Released)  Next appt:  10/20/2016 at 08:15 AM in Radiation Oncology ([facility])  Dx:  Diffuse large cell non-Hodgkin's lymp...   Ref Range & Units 08:40  Sodium 135 - 145 mmol/L 136   Potassium 3.5 - 5.1 mmol/L 3.6   Chloride 101 - 111 mmol/L 105   CO2 22 - 32 mmol/L 26   Glucose, Bld 65 - 99 mg/dL 119    BUN 6 - 20 mg/dL 21    Creatinine, Ser 0.44 - 1.00 mg/dL 1.08    Calcium 8.9 - 10.3 mg/dL 8.8    GFR calc non Af Amer >60 mL/min 50    GFR calc Af Amer >60 mL/min 58

## 2016-10-20 ENCOUNTER — Ambulatory Visit
Admission: RE | Admit: 2016-10-20 | Discharge: 2016-10-20 | Disposition: A | Discharge status: Home / Self Care | Payer: Medicare HMO | Source: Ambulatory Visit | Attending: Radiation Oncology | Admitting: Radiation Oncology

## 2016-10-20 DIAGNOSIS — Z510 Encounter for antineoplastic radiation therapy: Secondary | ICD-10-CM | POA: Diagnosis not present

## 2016-10-21 ENCOUNTER — Other Ambulatory Visit: Payer: Self-pay | Admitting: Hematology and Oncology

## 2016-10-21 DIAGNOSIS — C8330 Diffuse large B-cell lymphoma, unspecified site: Secondary | ICD-10-CM

## 2016-10-21 DIAGNOSIS — C8580 Other specified types of non-Hodgkin lymphoma, unspecified site: Principal | ICD-10-CM

## 2016-10-23 ENCOUNTER — Ambulatory Visit: Payer: Medicare HMO

## 2016-10-24 ENCOUNTER — Ambulatory Visit
Admission: RE | Admit: 2016-10-24 | Discharge: 2016-10-24 | Disposition: A | Discharge status: Home / Self Care | Payer: Medicare HMO | Source: Ambulatory Visit | Attending: Radiation Oncology | Admitting: Radiation Oncology

## 2016-10-24 DIAGNOSIS — Z510 Encounter for antineoplastic radiation therapy: Secondary | ICD-10-CM | POA: Diagnosis not present

## 2016-10-25 ENCOUNTER — Ambulatory Visit
Admission: RE | Admit: 2016-10-25 | Discharge: 2016-10-25 | Disposition: A | Discharge status: Home / Self Care | Payer: Medicare HMO | Source: Ambulatory Visit | Attending: Radiation Oncology | Admitting: Radiation Oncology

## 2016-10-25 DIAGNOSIS — Z510 Encounter for antineoplastic radiation therapy: Secondary | ICD-10-CM | POA: Diagnosis not present

## 2016-10-26 ENCOUNTER — Ambulatory Visit
Admission: RE | Admit: 2016-10-26 | Discharge: 2016-10-26 | Disposition: A | Discharge status: Home / Self Care | Payer: Medicare HMO | Source: Ambulatory Visit | Attending: Radiation Oncology | Admitting: Radiation Oncology

## 2016-10-26 DIAGNOSIS — Z510 Encounter for antineoplastic radiation therapy: Secondary | ICD-10-CM | POA: Diagnosis not present

## 2016-10-27 ENCOUNTER — Ambulatory Visit
Admission: RE | Admit: 2016-10-27 | Discharge: 2016-10-27 | Disposition: A | Discharge status: Home / Self Care | Payer: Medicare HMO | Source: Ambulatory Visit | Attending: Radiation Oncology | Admitting: Radiation Oncology

## 2016-10-27 DIAGNOSIS — Z510 Encounter for antineoplastic radiation therapy: Secondary | ICD-10-CM | POA: Diagnosis not present

## 2016-10-30 ENCOUNTER — Ambulatory Visit
Admission: RE | Admit: 2016-10-30 | Discharge: 2016-10-30 | Disposition: A | Discharge status: Home / Self Care | Payer: Medicare HMO | Source: Ambulatory Visit | Attending: Radiation Oncology | Admitting: Radiation Oncology

## 2016-10-30 DIAGNOSIS — Z510 Encounter for antineoplastic radiation therapy: Secondary | ICD-10-CM | POA: Diagnosis not present

## 2016-10-31 ENCOUNTER — Ambulatory Visit: Payer: Medicare HMO

## 2016-10-31 ENCOUNTER — Ambulatory Visit
Admission: RE | Admit: 2016-10-31 | Discharge: 2016-10-31 | Disposition: A | Discharge status: Home / Self Care | Payer: Medicare HMO | Source: Ambulatory Visit | Attending: Radiation Oncology | Admitting: Radiation Oncology

## 2016-10-31 DIAGNOSIS — Z510 Encounter for antineoplastic radiation therapy: Secondary | ICD-10-CM | POA: Diagnosis not present

## 2016-11-01 ENCOUNTER — Ambulatory Visit
Admission: RE | Admit: 2016-11-01 | Discharge: 2016-11-01 | Disposition: A | Discharge status: Home / Self Care | Payer: Medicare HMO | Source: Ambulatory Visit | Attending: Radiation Oncology | Admitting: Radiation Oncology

## 2016-11-01 DIAGNOSIS — Z510 Encounter for antineoplastic radiation therapy: Secondary | ICD-10-CM | POA: Diagnosis not present

## 2016-11-21 ENCOUNTER — Telehealth: Payer: Self-pay | Admitting: *Deleted

## 2016-11-21 DIAGNOSIS — E876 Hypokalemia: Secondary | ICD-10-CM

## 2016-11-21 DIAGNOSIS — C8580 Other specified types of non-Hodgkin lymphoma, unspecified site: Secondary | ICD-10-CM

## 2016-11-21 NOTE — Telephone Encounter (Signed)
  Check BMP and uric acid  M

## 2016-11-21 NOTE — Telephone Encounter (Signed)
Asking if Toni Griffith needs to continue taking the Allopurinol and Potassium pills. If so, she will need a refill on each. Please advise

## 2016-11-21 NOTE — Telephone Encounter (Signed)
Appt added to 5/7 for lab draw, pt living in [facility] now and has appt 5/7 already.

## 2016-11-24 ENCOUNTER — Other Ambulatory Visit: Payer: Medicare HMO

## 2016-12-04 ENCOUNTER — Telehealth: Payer: Self-pay | Admitting: *Deleted

## 2016-12-04 ENCOUNTER — Other Ambulatory Visit: Payer: Self-pay | Admitting: *Deleted

## 2016-12-04 ENCOUNTER — Ambulatory Visit
Admission: RE | Admit: 2016-12-04 | Discharge: 2016-12-04 | Disposition: A | Discharge status: Home / Self Care | Payer: Medicare HMO | Source: Ambulatory Visit | Attending: Radiation Oncology | Admitting: Radiation Oncology

## 2016-12-04 ENCOUNTER — Inpatient Hospital Stay: Payer: Medicare HMO | Attending: Hematology and Oncology

## 2016-12-04 ENCOUNTER — Encounter: Payer: Self-pay | Admitting: Radiation Oncology

## 2016-12-04 ENCOUNTER — Ambulatory Visit: Payer: Medicare HMO | Admitting: Hematology and Oncology

## 2016-12-04 VITALS — BP 159/88 | HR 82 | Temp 97.5°F | Wt 175.5 lb

## 2016-12-04 DIAGNOSIS — C8580 Other specified types of non-Hodgkin lymphoma, unspecified site: Principal | ICD-10-CM

## 2016-12-04 DIAGNOSIS — M129 Arthropathy, unspecified: Secondary | ICD-10-CM | POA: Insufficient documentation

## 2016-12-04 DIAGNOSIS — C8510 Unspecified B-cell lymphoma, unspecified site: Secondary | ICD-10-CM | POA: Diagnosis not present

## 2016-12-04 DIAGNOSIS — I2510 Atherosclerotic heart disease of native coronary artery without angina pectoris: Secondary | ICD-10-CM | POA: Diagnosis not present

## 2016-12-04 DIAGNOSIS — R5383 Other fatigue: Secondary | ICD-10-CM | POA: Insufficient documentation

## 2016-12-04 DIAGNOSIS — Z801 Family history of malignant neoplasm of trachea, bronchus and lung: Secondary | ICD-10-CM | POA: Diagnosis not present

## 2016-12-04 DIAGNOSIS — R918 Other nonspecific abnormal finding of lung field: Secondary | ICD-10-CM | POA: Insufficient documentation

## 2016-12-04 DIAGNOSIS — Z7982 Long term (current) use of aspirin: Secondary | ICD-10-CM | POA: Diagnosis not present

## 2016-12-04 DIAGNOSIS — C8330 Diffuse large B-cell lymphoma, unspecified site: Secondary | ICD-10-CM

## 2016-12-04 DIAGNOSIS — Z79899 Other long term (current) drug therapy: Secondary | ICD-10-CM | POA: Diagnosis not present

## 2016-12-04 DIAGNOSIS — E041 Nontoxic single thyroid nodule: Secondary | ICD-10-CM | POA: Insufficient documentation

## 2016-12-04 DIAGNOSIS — Z923 Personal history of irradiation: Secondary | ICD-10-CM | POA: Diagnosis not present

## 2016-12-04 DIAGNOSIS — R200 Anesthesia of skin: Secondary | ICD-10-CM | POA: Insufficient documentation

## 2016-12-04 DIAGNOSIS — Z87891 Personal history of nicotine dependence: Secondary | ICD-10-CM | POA: Insufficient documentation

## 2016-12-04 DIAGNOSIS — R202 Paresthesia of skin: Secondary | ICD-10-CM | POA: Insufficient documentation

## 2016-12-04 DIAGNOSIS — E876 Hypokalemia: Secondary | ICD-10-CM | POA: Diagnosis not present

## 2016-12-04 DIAGNOSIS — Z806 Family history of leukemia: Secondary | ICD-10-CM | POA: Diagnosis not present

## 2016-12-04 DIAGNOSIS — I10 Essential (primary) hypertension: Secondary | ICD-10-CM | POA: Insufficient documentation

## 2016-12-04 DIAGNOSIS — D649 Anemia, unspecified: Secondary | ICD-10-CM | POA: Diagnosis not present

## 2016-12-04 DIAGNOSIS — C8335 Diffuse large B-cell lymphoma, lymph nodes of inguinal region and lower limb: Secondary | ICD-10-CM | POA: Diagnosis present

## 2016-12-04 LAB — BASIC METABOLIC PANEL
Anion gap: 8 (ref 5–15)
BUN: 26 mg/dL — ABNORMAL HIGH (ref 6–20)
CO2: 23 mmol/L (ref 22–32)
Calcium: 8.9 mg/dL (ref 8.9–10.3)
Chloride: 102 mmol/L (ref 101–111)
Creatinine, Ser: 1.15 mg/dL — ABNORMAL HIGH (ref 0.44–1.00)
GFR calc Af Amer: 53 mL/min — ABNORMAL LOW (ref >60)
GFR calc non Af Amer: 46 mL/min — ABNORMAL LOW (ref >60)
Glucose, Bld: 88 mg/dL (ref 65–99)
Potassium: 3.1 mmol/L — ABNORMAL LOW (ref 3.5–5.1)
Sodium: 133 mmol/L — ABNORMAL LOW (ref 135–145)

## 2016-12-04 LAB — URIC ACID: Uric Acid, Serum: 4.4 mg/dL (ref 2.3–6.6)

## 2016-12-04 MED ORDER — POTASSIUM CHLORIDE ER 10 MEQ PO TBCR: 10.0000 meq | EXTENDED_RELEASE_TABLET | Freq: Every day | ORAL | 1 refills | Status: DC

## 2016-12-04 MED ORDER — SODIUM CHLORIDE 0.9% FLUSH
10.0000 mL | INTRAVENOUS | Status: DC | PRN
  Administered 2016-12-04: 10 mL via INTRAVENOUS
  Filled 2016-12-04: qty 10

## 2016-12-04 MED ORDER — HEPARIN SOD (PORK) LOCK FLUSH 100 UNIT/ML IV SOLN
500.0000 [IU] | Freq: Once | INTRAVENOUS | Status: AC
  Administered 2016-12-04: 500 [IU] via INTRAVENOUS

## 2016-12-04 NOTE — Telephone Encounter (Signed)
Called daughter patty and discussed low potassium and need for supplement. kcl sent to pharmacy per orders, voiced understanding.

## 2016-12-04 NOTE — Progress Notes (Signed)
Radiation Oncology Follow up Note  Name: Toni Griffith   Date:   12/04/2016 MRN:  [id number] DOB: 11-Apr-1941    This 76 y.o. female presents to the clinic today for follow-up for involved field radiation therapy for diffuse large cell non-Hodgkin's lymphoma treated to her right iliac wing.  REFERRING PROVIDER: Dionne Bucy., MD  HPI: Patient is a 76 year old deaf female now 1 month out having been treated with involved field radiation therapy for diffuse B-cell lymphoma with a large right iliac wing lesion. Seen today in routine follow-up she states she's felt better now than she has in a long time she specifically denies diarrhea dysuria or any other GI/GU complaints. She does have a right upper lobe 9 mm lesion we are following. She specifically denies cough hemoptysis or chest tightness..  COMPLICATIONS OF TREATMENT: none  FOLLOW UP COMPLIANCE: keeps appointments   PHYSICAL EXAM:  BP (!) 159/88   Pulse 82   Temp 97.5 F (36.4 C)   Wt 175 lb 7.8 oz (79.6 kg)   BMI 31.09 kg/m  No evidence of peripheral adenopathy is identified. Well-developed well-nourished patient in NAD. HEENT reveals PERLA, EOMI, discs not visualized.  Oral cavity is clear. No oral mucosal lesions are identified. Neck is clear without evidence of cervical or supraclavicular adenopathy. Lungs are clear to A&P. Cardiac examination is essentially unremarkable with regular rate and rhythm without murmur rub or thrill. Abdomen is benign with no organomegaly or masses noted. Motor sensory and DTR levels are equal and symmetric in the upper and lower extremities. Cranial nerves II through XII are grossly intact. Proprioception is intact. No peripheral adenopathy or edema is identified. No motor or sensory levels are noted. Crude visual fields are within normal range.  RADIOLOGY RESULTS: No current films for review  PLAN: Present time patient is doing well. I'll turn follow-up care over to medical oncology. Patient will  probably need a repeat PET CT scan in the next several months and I will keep an eye on her right upper lobe nodule. I believe this is determined to be 8 lung cancer we can always do SB RT on it. Patient and family are comfortable followed by medical oncology. We'll be happy to reevaluate patient at any time should further treatment be indicated.  I would like to take this opportunity to thank you for allowing me to participate in the care of your patient.Armstead Peaks., MD

## 2016-12-18 ENCOUNTER — Encounter: Payer: Self-pay | Admitting: Hematology and Oncology

## 2016-12-18 ENCOUNTER — Inpatient Hospital Stay: Payer: Medicare HMO

## 2016-12-18 ENCOUNTER — Telehealth: Payer: Self-pay | Admitting: *Deleted

## 2016-12-18 ENCOUNTER — Inpatient Hospital Stay (HOSPITAL_BASED_OUTPATIENT_CLINIC_OR_DEPARTMENT_OTHER): Payer: Medicare HMO | Admitting: Hematology and Oncology

## 2016-12-18 VITALS — BP 131/82 | HR 80 | Temp 98.6°F | Resp 18 | Wt 176.3 lb

## 2016-12-18 DIAGNOSIS — I2510 Atherosclerotic heart disease of native coronary artery without angina pectoris: Secondary | ICD-10-CM

## 2016-12-18 DIAGNOSIS — C8510 Unspecified B-cell lymphoma, unspecified site: Secondary | ICD-10-CM

## 2016-12-18 DIAGNOSIS — M129 Arthropathy, unspecified: Secondary | ICD-10-CM

## 2016-12-18 DIAGNOSIS — Z806 Family history of leukemia: Secondary | ICD-10-CM

## 2016-12-18 DIAGNOSIS — Z7982 Long term (current) use of aspirin: Secondary | ICD-10-CM

## 2016-12-18 DIAGNOSIS — E876 Hypokalemia: Secondary | ICD-10-CM

## 2016-12-18 DIAGNOSIS — Z87891 Personal history of nicotine dependence: Secondary | ICD-10-CM | POA: Diagnosis not present

## 2016-12-18 DIAGNOSIS — R918 Other nonspecific abnormal finding of lung field: Secondary | ICD-10-CM | POA: Diagnosis not present

## 2016-12-18 DIAGNOSIS — Z79899 Other long term (current) drug therapy: Secondary | ICD-10-CM

## 2016-12-18 DIAGNOSIS — R911 Solitary pulmonary nodule: Secondary | ICD-10-CM

## 2016-12-18 DIAGNOSIS — E041 Nontoxic single thyroid nodule: Secondary | ICD-10-CM

## 2016-12-18 DIAGNOSIS — C8580 Other specified types of non-Hodgkin lymphoma, unspecified site: Principal | ICD-10-CM

## 2016-12-18 DIAGNOSIS — C8330 Diffuse large B-cell lymphoma, unspecified site: Secondary | ICD-10-CM

## 2016-12-18 DIAGNOSIS — I10 Essential (primary) hypertension: Secondary | ICD-10-CM | POA: Diagnosis not present

## 2016-12-18 DIAGNOSIS — R200 Anesthesia of skin: Secondary | ICD-10-CM | POA: Diagnosis not present

## 2016-12-18 DIAGNOSIS — R202 Paresthesia of skin: Secondary | ICD-10-CM | POA: Diagnosis not present

## 2016-12-18 DIAGNOSIS — Z801 Family history of malignant neoplasm of trachea, bronchus and lung: Secondary | ICD-10-CM

## 2016-12-18 DIAGNOSIS — R5383 Other fatigue: Secondary | ICD-10-CM | POA: Diagnosis not present

## 2016-12-18 DIAGNOSIS — C7951 Secondary malignant neoplasm of bone: Secondary | ICD-10-CM

## 2016-12-18 DIAGNOSIS — D649 Anemia, unspecified: Secondary | ICD-10-CM

## 2016-12-18 DIAGNOSIS — C8198 Hodgkin lymphoma, unspecified, lymph nodes of multiple sites: Secondary | ICD-10-CM

## 2016-12-18 LAB — CBC WITH DIFFERENTIAL/PLATELET
Basophils Absolute: 0.1 10*3/uL (ref 0–0.1)
Basophils Relative: 1 %
Eosinophils Absolute: 0.2 10*3/uL (ref 0–0.7)
Eosinophils Relative: 2 %
HCT: 34.4 % — ABNORMAL LOW (ref 35.0–47.0)
Hemoglobin: 11.9 g/dL — ABNORMAL LOW (ref 12.0–16.0)
Lymphocytes Relative: 16 %
Lymphs Abs: 1.4 10*3/uL (ref 1.0–3.6)
MCH: 31 pg (ref 26.0–34.0)
MCHC: 34.5 g/dL (ref 32.0–36.0)
MCV: 89.7 fL (ref 80.0–100.0)
Monocytes Absolute: 1 10*3/uL — ABNORMAL HIGH (ref 0.2–0.9)
Monocytes Relative: 12 %
Neutro Abs: 5.9 10*3/uL (ref 1.4–6.5)
Neutrophils Relative %: 69 %
Platelets: 330 10*3/uL (ref 150–440)
RBC: 3.83 MIL/uL (ref 3.80–5.20)
RDW: 16 % — ABNORMAL HIGH (ref 11.5–14.5)
WBC: 8.7 10*3/uL (ref 3.6–11.0)

## 2016-12-18 LAB — COMPREHENSIVE METABOLIC PANEL
ALT: 24 U/L (ref 14–54)
AST: 27 U/L (ref 15–41)
Albumin: 3.6 g/dL (ref 3.5–5.0)
Alkaline Phosphatase: 84 U/L (ref 38–126)
Anion gap: 9 (ref 5–15)
BUN: 23 mg/dL — ABNORMAL HIGH (ref 6–20)
CO2: 25 mmol/L (ref 22–32)
Calcium: 9.3 mg/dL (ref 8.9–10.3)
Chloride: 101 mmol/L (ref 101–111)
Creatinine, Ser: 1.3 mg/dL — ABNORMAL HIGH (ref 0.44–1.00)
GFR calc Af Amer: 46 mL/min — ABNORMAL LOW (ref >60)
GFR calc non Af Amer: 40 mL/min — ABNORMAL LOW (ref >60)
Glucose, Bld: 103 mg/dL — ABNORMAL HIGH (ref 65–99)
Potassium: 3.9 mmol/L (ref 3.5–5.1)
Sodium: 135 mmol/L (ref 135–145)
Total Bilirubin: 1 mg/dL (ref 0.3–1.2)
Total Protein: 6.8 g/dL (ref 6.5–8.1)

## 2016-12-18 LAB — URIC ACID: Uric Acid, Serum: 5.5 mg/dL (ref 2.3–6.6)

## 2016-12-18 LAB — LACTATE DEHYDROGENASE: LDH: 122 U/L (ref 98–192)

## 2016-12-18 MED ORDER — ALLOPURINOL 300 MG PO TABS: 300.0000 mg | ORAL_TABLET | Freq: Every day | ORAL | 5 refills | Status: DC

## 2016-12-18 NOTE — Progress Notes (Signed)
.[facility] day:  12/18/2016   Chief Complaint: Toni Griffith is a 76 y.o. female with a gray zone lymphoma who is seen for review of interval PET scan and assessment following 6 cycles of  mini-RCHOP.  HPI:  The patient was last seen in the medical oncology clinic by me on 08/07/2016.  At that time, she felt fine.  She denied any B symptoms.  She had some numbness and tingling in her fingertips.  Exam was unremarkable. She had hypokalemia (3.3).  PET scan revealed an excellent response to therapy.  We discussed the unclear 9 mm RUL lesion (stable since diagnosis; doubt lymphoma; suspect different etiology-? low grade lung cancer).  We discussed consideration of consolidation radiation therapy to right iliac wing.  She was seen at [facility] by Dr. Corey Harold for second opinion on 09/08/2016.    She saw Dr. Baruch Gouty in consultation on 09/26/2016.  Discussions were held regarding treatment of the right iliac wing with a dose of 3000 cGy over 3 weeks.  She received radiation from 10/11/2016 - 11/01/2016.  Symptomatically, she notes a little fatigue. She states she stopped her potassium after she met with Dr.Beaven. She notes a little bit of numbness and tingling in her fingertips.  She denies any foot drop. She denies any bone pain. She denies any respiratory symptoms.  She had her port flushed 2 weeks ago.   Past Medical History:  Diagnosis Date  . Arthritis   . Cancer ([place])   . Hypertension     Past Surgical History:  Procedure Laterality Date  . CARDIAC SURGERY  1993  . CYST REMOVAL HAND    . PERIPHERAL VASCULAR CATHETERIZATION N/A 04/06/2016   Procedure: Glori Luis Cath Insertion;  Surgeon: Algernon Huxley, MD;  Location: [place] [facility];  Service: Cardiovascular;  Laterality: N/A;    Family History  Problem Relation Age of Onset  . Lung cancer Mother   . Lung cancer Father   . Diabetes Maternal Uncle   . Myasthenia gravis Maternal Grandmother   .  Leukemia Maternal Grandfather     Social History:  reports that she has quit smoking. She has never used smokeless tobacco. She reports that she drinks alcohol. She reports that she does not use drugs.  She smoked 1 1/2 packs per day for 20 years (30 pack year smoking history) until 10/2015.  She is hearing impaired (deaf).  She lives in [place] with her boyfriend.  Her daughter lives in [place]. The patient is accompanied by her daughter-in-law and the interpreter (sign language) today.  Allergies: No Known Allergies  Current Medications: Current Outpatient Prescriptions  Medication Sig Dispense Refill  . aspirin 325 MG tablet Take 325 mg by mouth daily.    Marland Kitchen atorvastatin (LIPITOR) 80 MG tablet Take 80 mg by mouth.    . B Complex Vitamins (B COMPLEX 1 PO) Take by mouth.    . Cholecalciferol (VITAMIN D3) 2000 units capsule Take 2,000 Units by mouth daily.    Marland Kitchen FLUoxetine (PROZAC) 40 MG capsule Take 40 mg by mouth.    Marland Kitchen KLOR-CON 10 10 MEQ tablet TAKE 1 TABLET (10 MEQ TOTAL) BY MOUTH 2 (TWO) TIMES DAILY. 60 tablet 0  . lidocaine-prilocaine (EMLA) cream Apply cream 1 hour before chemotherapy treatment and place small peive of saran wrap over cream to protect clothing 30 g 1  . pantoprazole (PROTONIX) 40 MG tablet Take 40 mg by mouth.    . potassium  chloride (K-DUR) 10 MEQ tablet Take 1 tablet (10 mEq total) by mouth daily. Take twice daily for 3 days then once daily 30 tablet 1  . triamterene-hydrochlorothiazide (DYAZIDE) 37.5-25 MG capsule Take by mouth.    . vitamin B-12 (CYANOCOBALAMIN) 500 MCG tablet Take 500 mcg by mouth daily.    . vitamin E 400 UNIT capsule Take by mouth.    Marland Kitchen acetaminophen (TYLENOL) 325 MG tablet Take 650 mg by mouth.    Marland Kitchen allopurinol (ZYLOPRIM) 300 MG tablet Take 1 tablet (300 mg total) by mouth daily. (Patient not taking: Reported on 12/18/2016) 30 tablet 2  . allopurinol (ZYLOPRIM) 300 MG tablet TAKE 1 TABLET (300 MG TOTAL) BY MOUTH DAILY. (Patient not taking:  Reported on 12/18/2016) 30 tablet 0  . gabapentin (NEURONTIN) 100 MG capsule     . naproxen sodium (ANAPROX) 220 MG tablet Take 220 mg by mouth as needed.    . ondansetron (ZOFRAN ODT) 4 MG disintegrating tablet Take 1 tablet (4 mg total) by mouth every 8 (eight) hours as needed for nausea or vomiting. (Patient not taking: Reported on 08/07/2016) 20 tablet 0  . ondansetron (ZOFRAN) 8 MG tablet Take 1 tablet (8 mg total) by mouth 2 (two) times daily as needed for refractory nausea / vomiting. Start on day 3 after cyclophosphamide chemotherapy. (Patient not taking: Reported on 08/07/2016) 30 tablet 1  . potassium chloride (KLOR-CON 10) 10 MEQ tablet Take 1 tablet (10 mEq total) by mouth daily. (Patient not taking: Reported on 12/18/2016) 30 tablet 1  . predniSONE (DELTASONE) 20 MG tablet Take 3.5 tablets (70 mg total) by mouth daily with breakfast. Take on days 1-5 of chemotherapy. (Patient not taking: Reported on 12/18/2016) 20 tablet 5  . promethazine (PHENERGAN) 12.5 MG tablet Take 1 tablet (12.5 mg total) by mouth every 6 (six) hours as needed for nausea or vomiting. (Patient not taking: Reported on 12/18/2016) 20 tablet 0   No current facility-administered medications for this visit.     Review of Systems:  GENERAL:  Feels "a little fatigued".  No fever, chills or sweats.  Weight up 11 pounds. PERFORMANCE STATUS (ECOG):  2 HEENT:  Deaf.  No visual changes, runny nose, sore throat, mouth sores or tenderness. Lungs: No shortness of breath or cough.  No hemoptysis. Cardiac:  No chest pain, palpitations, orthopnea, or PND.  GI: No nausea, vomiting, diarrhea, constipation, melena or hematochezia. GU:  No urgency, frequency, dysuria, or hematuria. Musculoskeletal:  Arthritis.  No back pain.  No muscle tenderness. Extremities:  No pain or swelling. Skin:  No rashes or skin changes. Neuro:  Little numbness in fingertips. No foot drop.  No headache, numbness or weakness, balance or coordination  issues. Endocrine:  No diabetes, thyroid issues, hot flashes or night sweats. Psych:  No mood changes, depression or anxiety. Pain:  No focal pain. Review of systems:  All other systems reviewed and found to be negative.  Physical Exam:  Blood pressure 131/82, pulse 80, temperature 98.6 F (37 C), temperature source Tympanic, resp. rate 18, weight 176 lb 5 oz (80 kg). GENERAL:  Elderly woman sitting comfortably in a wheelchair in the exam room in no acute distress.  Slow gait.  She needs assistance onto the table. MENTAL STATUS:  Alert and oriented to person, place and time. HEAD:  Curly gray hair.  Normocephalic, atraumatic, face symmetric, no Cushingoid features. EYES:  Hazel eyes.  Pupils equal round and reactive to light and accomodation.  No conjunctivitis or scleral icterus.  ENT:  Oropharynx clear without lesion.  Upper dentures.  Tongue normal. Mucous membranes moist.  RESPIRATORY:  Clear to auscultation without rales, wheezes or rhonchi. CARDIOVASCULAR:  Regular rate and rhythm without murmur, rub or gallop. ABDOMEN:  Soft, non-tender, with active bowel sounds, and no appreciable hepatosplenomegaly.  No masses. SKIN:  No rashes, ulcers or lesions. EXTREMITIES: No edema, no skin discoloration or tenderness.  No palpable cords. LYMPH NODES: No palpable cervical, supraclavicular, axillary or inguinal adenopathy  NEUROLOGICAL:  Lower extremity strength and sensation symmetric.  Bilateral patellar reflexes symmetric.  No foot drop (able to stand on heels). PSYCH:  Appropriate.   No visits with results within 3 Day(s) from this visit.  Latest known visit with results is:  Infusion on 12/04/2016  Component Date Value Ref Range Status  . Sodium 12/04/2016 133* 135 - 145 mmol/L Final  . Potassium 12/04/2016 3.1* 3.5 - 5.1 mmol/L Final  . Chloride 12/04/2016 102  101 - 111 mmol/L Final  . CO2 12/04/2016 23  22 - 32 mmol/L Final  . Glucose, Bld 12/04/2016 88  65 - 99 mg/dL Final  . BUN  12/04/2016 26* 6 - 20 mg/dL Final  . Creatinine, Ser 12/04/2016 1.15* 0.44 - 1.00 mg/dL Final  . Calcium 12/04/2016 8.9  8.9 - 10.3 mg/dL Final  . GFR calc non Af Amer 12/04/2016 46* >60 mL/min Final  . GFR calc Af Amer 12/04/2016 53* >60 mL/min Final   Comment: (NOTE) The eGFR has been calculated using the CKD EPI equation. This calculation has not been validated in all clinical situations. eGFR's persistently <60 mL/min signify possible Chronic Kidney Disease.   . Anion gap 12/04/2016 8  5 - 15 Final  . Uric Acid, Serum 12/04/2016 4.4  2.3 - 6.6 mg/dL Final    Assessment:  Chela Sutphen is a 76 y.o. female with stage IVB B-cell lymphoma, unclassifiable, with features intermediate between diffuse large B-cell lymphoma and classical Hodgkin's lymphoma ("gray zone" lymphoma).  She underwent right iliac wing bone/bone marrow biopsy on 03/08/2016.  Chest, abdomen and pelvic CT scan without contrast on 02/20/2016 revealed a 3.7 cm subpleural mass within the left lower lobe, a 1.1 cm right lower lobe subpleural nodule , 9 mm RUL nodule, and 0.4 cm lingular nodule.  Between the left second, third, and fourth ribs there was 1.9 cm soft tissue mass. There was a 2.5 cm low-attenuation nodule in the left lobe of the thyroid.   There were prominent left axillary nodes (up to 1.0 cm).   There was no definite mediastinal adenopathy. There was a 5.5 x 4.6 cm cystic lesion in the left hepatic lobe.  There was a 3.1 cm nodule in the right adrenal gland. There was a 1.2 cm right retrocaval lymph node with additional prominent subcentimeter retroperitoneal nodes. There was 1.0 cm left common iliac node. There was a 6.2 x 5.2 cm soft tissue mass within the right ilium.   Head MRI on 02/21/2016 revealed no evidence of malignancy. 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   There was activity in the superior left ocular orbit.  There was a large lesion centered in the right iliac wing with soft tissue extension.  There were hypermetabolic right external iliac lymph nodes  She was  admitted to [facility].  She had renal failure and hypercalcemia. She was treated with IVF, calcitonin and Zometa.The patient has a history of coronary artery disease s/p CABG in 1993.  She has no history of heart failure.  She is deaf and requires an interpreter.  Echo on 04/04/2016 revealed an EF of 55-60%.  Hepatitis B and C testing was negative on 03/30/2016.   G6PD assay was normal.  She declined LP with MTX prophylaxis.  She received 6 cycles of mini-RCHOP (04/07/2016 - 07/21/2016).  She has had some transient fingertip numbness.  PET scan on 06/06/2016 revealed significant interval response to therapy.  There was significant decrease in size and FDG uptake associated with left lower lobe lung lesion.  There was resolution of left-sided pleural base metastasis and left axillary and anterior mediastinal nodal metastasis.  There was resolution of previous hypermetabolic soft tissue metastasis/nodal metastasis posterior to the IVC.  There was marked improvement in hypermetabolic metastasis involving the right iliac wing.  There was persistent right upper lobe lung nodule which exhibits intense radiotracer uptake which may represent a focus of metastatic disease or synchronous primary lung neoplasm.  PET scan on 08/01/2016 revealed no new or progressive findings.  The 9 mm RUL pulmonary nodule remained hypermetabolic (SUV 81.8, previously 7.3) and no change in size.  The index lesion in the posterior left lower lobe was minimally smaller on CT imaging with very low level FDG uptake (SUV 1.6).  There was no change in the appearance at site of previous disease at  the right iliac wing (SUV 1.9 compared to 2.6 previously).  She received radiation to the right iliac wing,  from 10/11/2016 - 11/01/2016.  She received 3000 cGy over 3 weeks.  She has a normocytic anemia.  Labs on 04/17/2016 revealed an elevated ferritin (444), iron saturation (11%), low TIBC (240), B12 (2409), and folate (10.8).  She has chronic hypokalemia.  She stopped her potassium supplementation.  Symptomatically, she feels good.  She denies any B symptoms.  She has some numbness and tingling in her fingertips.  Exam is unremarkable. She has hypokalemia (3.1).  Plan: 1.  Labs today:  CBC with diff,CMP, LDH, uric acid. 2.  PET scan on 01/29/2017 3.  Potassium 20 meq x 2 days then 10 meq a day. 4.  Port flush every 6-8 weeks (last 2 weeks ago). 5.  RTC 2nd week in 01/2017 for MD assess, labs (BMP) and review of PET scan.   Lequita Asal, MD  12/18/2016, 12:04 PM

## 2016-12-18 NOTE — Progress Notes (Signed)
Patient states her knees hurt a lot because of gout.  Asking for refill for allopurinol

## 2016-12-18 NOTE — Telephone Encounter (Signed)
-----   Message from Lequita Asal, MD sent at 12/18/2016  1:49 PM EDT ----- Regarding: Please call patient  Continue current dose of potassium (1 pill a day) as her potassium is normal.  M  ----- Message ----- From: Interface, [facility] In [place] Sent: 12/18/2016  12:51 PM To: Lequita Asal, MD

## 2016-12-18 NOTE — Telephone Encounter (Signed)
Called patient's daughter, [name] and LVM that K+ is normal.  Patient should continue current dosage of K+.  Also informed her that appointments have been mailed.

## 2017-01-27 ENCOUNTER — Other Ambulatory Visit: Payer: Self-pay | Admitting: Hematology and Oncology

## 2017-01-27 DIAGNOSIS — C8580 Other specified types of non-Hodgkin lymphoma, unspecified site: Principal | ICD-10-CM

## 2017-01-27 DIAGNOSIS — C8330 Diffuse large B-cell lymphoma, unspecified site: Secondary | ICD-10-CM

## 2017-01-29 ENCOUNTER — Ambulatory Visit
Admission: RE | Admit: 2017-01-29 | Discharge: 2017-01-29 | Disposition: A | Discharge status: Home / Self Care | Payer: Medicare HMO | Source: Ambulatory Visit | Attending: Hematology and Oncology | Admitting: Hematology and Oncology

## 2017-01-29 DIAGNOSIS — R918 Other nonspecific abnormal finding of lung field: Secondary | ICD-10-CM | POA: Insufficient documentation

## 2017-01-29 DIAGNOSIS — C8330 Diffuse large B-cell lymphoma, unspecified site: Secondary | ICD-10-CM

## 2017-01-29 DIAGNOSIS — I700 Atherosclerosis of aorta: Secondary | ICD-10-CM | POA: Insufficient documentation

## 2017-01-29 DIAGNOSIS — C8198 Hodgkin lymphoma, unspecified, lymph nodes of multiple sites: Secondary | ICD-10-CM | POA: Diagnosis present

## 2017-01-29 DIAGNOSIS — R911 Solitary pulmonary nodule: Secondary | ICD-10-CM | POA: Diagnosis present

## 2017-01-29 DIAGNOSIS — C7951 Secondary malignant neoplasm of bone: Secondary | ICD-10-CM | POA: Diagnosis not present

## 2017-01-29 DIAGNOSIS — E876 Hypokalemia: Secondary | ICD-10-CM

## 2017-01-29 DIAGNOSIS — C8580 Other specified types of non-Hodgkin lymphoma, unspecified site: Secondary | ICD-10-CM | POA: Diagnosis present

## 2017-01-29 LAB — GLUCOSE, CAPILLARY: Glucose-Capillary: 102 mg/dL — ABNORMAL HIGH (ref 65–99)

## 2017-01-29 MED ORDER — FLUDEOXYGLUCOSE F - 18 (FDG) INJECTION
13.0600 | Freq: Once | INTRAVENOUS | Status: AC | PRN
  Administered 2017-01-29: 13.06 via INTRAVENOUS

## 2017-01-29 NOTE — Telephone Encounter (Signed)
Dx:  Hypokalemia; Diffuse large cell non-H...   Ref Range & Units 69mo ago  Potassium 3.5 - 5.1 mmol/L 3.9

## 2017-02-05 ENCOUNTER — Encounter: Payer: Self-pay | Admitting: Hematology and Oncology

## 2017-02-05 ENCOUNTER — Inpatient Hospital Stay: Payer: Medicare HMO | Attending: Hematology and Oncology

## 2017-02-05 ENCOUNTER — Inpatient Hospital Stay (HOSPITAL_BASED_OUTPATIENT_CLINIC_OR_DEPARTMENT_OTHER): Payer: Medicare HMO | Admitting: Hematology and Oncology

## 2017-02-05 VITALS — BP 146/89 | HR 83 | Temp 97.9°F | Resp 18 | Wt 186.4 lb

## 2017-02-05 DIAGNOSIS — E876 Hypokalemia: Secondary | ICD-10-CM

## 2017-02-05 DIAGNOSIS — R200 Anesthesia of skin: Secondary | ICD-10-CM

## 2017-02-05 DIAGNOSIS — Z951 Presence of aortocoronary bypass graft: Secondary | ICD-10-CM | POA: Diagnosis not present

## 2017-02-05 DIAGNOSIS — I10 Essential (primary) hypertension: Secondary | ICD-10-CM | POA: Diagnosis not present

## 2017-02-05 DIAGNOSIS — R918 Other nonspecific abnormal finding of lung field: Secondary | ICD-10-CM | POA: Insufficient documentation

## 2017-02-05 DIAGNOSIS — Z87891 Personal history of nicotine dependence: Secondary | ICD-10-CM

## 2017-02-05 DIAGNOSIS — Z7982 Long term (current) use of aspirin: Secondary | ICD-10-CM | POA: Diagnosis not present

## 2017-02-05 DIAGNOSIS — C3432 Malignant neoplasm of lower lobe, left bronchus or lung: Secondary | ICD-10-CM | POA: Diagnosis not present

## 2017-02-05 DIAGNOSIS — C8580 Other specified types of non-Hodgkin lymphoma, unspecified site: Secondary | ICD-10-CM | POA: Diagnosis not present

## 2017-02-05 DIAGNOSIS — E041 Nontoxic single thyroid nodule: Secondary | ICD-10-CM | POA: Diagnosis not present

## 2017-02-05 DIAGNOSIS — Z923 Personal history of irradiation: Secondary | ICD-10-CM

## 2017-02-05 DIAGNOSIS — C8198 Hodgkin lymphoma, unspecified, lymph nodes of multiple sites: Secondary | ICD-10-CM

## 2017-02-05 DIAGNOSIS — M129 Arthropathy, unspecified: Secondary | ICD-10-CM | POA: Insufficient documentation

## 2017-02-05 DIAGNOSIS — Z806 Family history of leukemia: Secondary | ICD-10-CM | POA: Diagnosis not present

## 2017-02-05 DIAGNOSIS — C8330 Diffuse large B-cell lymphoma, unspecified site: Secondary | ICD-10-CM

## 2017-02-05 DIAGNOSIS — C7951 Secondary malignant neoplasm of bone: Secondary | ICD-10-CM

## 2017-02-05 DIAGNOSIS — I2510 Atherosclerotic heart disease of native coronary artery without angina pectoris: Secondary | ICD-10-CM | POA: Diagnosis not present

## 2017-02-05 DIAGNOSIS — Z801 Family history of malignant neoplasm of trachea, bronchus and lung: Secondary | ICD-10-CM | POA: Diagnosis not present

## 2017-02-05 DIAGNOSIS — Z79899 Other long term (current) drug therapy: Secondary | ICD-10-CM

## 2017-02-05 DIAGNOSIS — D649 Anemia, unspecified: Secondary | ICD-10-CM | POA: Diagnosis not present

## 2017-02-05 DIAGNOSIS — R911 Solitary pulmonary nodule: Secondary | ICD-10-CM

## 2017-02-05 LAB — BASIC METABOLIC PANEL
Anion gap: 8 (ref 5–15)
BUN: 30 mg/dL — ABNORMAL HIGH (ref 6–20)
CO2: 24 mmol/L (ref 22–32)
Calcium: 9 mg/dL (ref 8.9–10.3)
Chloride: 105 mmol/L (ref 101–111)
Creatinine, Ser: 1.36 mg/dL — ABNORMAL HIGH (ref 0.44–1.00)
GFR calc Af Amer: 44 mL/min — ABNORMAL LOW (ref >60)
GFR calc non Af Amer: 38 mL/min — ABNORMAL LOW (ref >60)
Glucose, Bld: 111 mg/dL — ABNORMAL HIGH (ref 65–99)
Potassium: 3.8 mmol/L (ref 3.5–5.1)
Sodium: 137 mmol/L (ref 135–145)

## 2017-02-05 MED ORDER — HEPARIN SOD (PORK) LOCK FLUSH 100 UNIT/ML IV SOLN
500.0000 [IU] | Freq: Once | INTRAVENOUS | Status: AC
  Administered 2017-02-05: 500 [IU] via INTRAVENOUS

## 2017-02-05 MED ORDER — SODIUM CHLORIDE 0.9% FLUSH
10.0000 mL | INTRAVENOUS | Status: DC | PRN
  Administered 2017-02-05: 10 mL via INTRAVENOUS
  Filled 2017-02-05: qty 10

## 2017-02-05 NOTE — Progress Notes (Signed)
.[facility] day:  02/05/2017   Chief Complaint: Toni Griffith is a 76 y.o. female with a gray zone lymphoma who is seen for review of interval PET scan and assessment following 6 cycles of mini-RCHOP.  HPI:  The patient was last seen in the medical oncology clinic by me on 12/18/2016.  At that time, she was doing well.  She denied any B symptoms.   Potassium was low.  Exam revealed no adenopathy or hepatosplenomegaly.  PET scan on 01/29/2017 revealed interval progression of hypermetabolic nodules in the right upper (13 mm compared to 9 mm; SUV 10) and left lower lobes (16 mm compared to 11 mm; SUV 7.5).  There was new hypermetabolic focus of activity along the anterior left pleura although no underlying pleural or lung mass was evident.  During the interim, she has felt fine. She denies any problems. She notes a knee problem for which she had a cortisone shot on 01/27/2017.   Past Medical History:  Diagnosis Date  . Arthritis   . Cancer ([facility])   . Hypertension     Past Surgical History:  Procedure Laterality Date  . CARDIAC SURGERY  1993  . CYST REMOVAL HAND    . PERIPHERAL VASCULAR CATHETERIZATION N/A 04/06/2016   Procedure: Glori Luis Cath Insertion;  Surgeon: Algernon Huxley, MD;  Location: [facility];  Service: Cardiovascular;  Laterality: N/A;    Family History  Problem Relation Age of Onset  . Lung cancer Mother   . Lung cancer Father   . Diabetes Maternal Uncle   . Myasthenia gravis Maternal Grandmother   . Leukemia Maternal Grandfather     Social History:  reports that she has quit smoking. She has never used smokeless tobacco. She reports that she drinks alcohol. She reports that she does not use drugs.  She smoked 1 1/2 packs per day for 20 years (30 pack year smoking history) until 10/2015.  She is hearing impaired (deaf).  She lives in [place] with her boyfriend.  Her daughter lives in [place]. The patient is accompanied by  her son, daughter, ans the interpreter (sign language) today.  Allergies: No Known Allergies  Current Medications: Current Outpatient Prescriptions  Medication Sig Dispense Refill  . acetaminophen (TYLENOL) 325 MG tablet Take 650 mg by mouth.    Marland Kitchen allopurinol (ZYLOPRIM) 300 MG tablet Take 1 tablet (300 mg total) by mouth daily. 30 tablet 5  . aspirin 325 MG tablet Take 325 mg by mouth daily.    Marland Kitchen atorvastatin (LIPITOR) 80 MG tablet Take 80 mg by mouth.    . B Complex Vitamins (B COMPLEX 1 PO) Take by mouth.    . Cholecalciferol (VITAMIN D3) 2000 units capsule Take 2,000 Units by mouth daily.    Marland Kitchen gabapentin (NEURONTIN) 100 MG capsule     . KLOR-CON 10 10 MEQ tablet TAKE 1 TABLET (10 MEQ TOTAL) BY MOUTH 2 (TWO) TIMES DAILY. 60 tablet 0  . lidocaine-prilocaine (EMLA) cream Apply cream 1 hour before chemotherapy treatment and place small peive of saran wrap over cream to protect clothing 30 g 1  . pantoprazole (PROTONIX) 40 MG tablet Take 40 mg by mouth.    . potassium chloride (KLOR-CON 10) 10 MEQ tablet Take 1 tablet (10 mEq total) by mouth daily. 30 tablet 1  . triamterene-hydrochlorothiazide (DYAZIDE) 37.5-25 MG capsule Take by mouth.    . vitamin B-12 (CYANOCOBALAMIN) 500 MCG tablet Take 500 mcg by mouth daily.    Marland Kitchen  vitamin E 400 UNIT capsule Take by mouth.    Marland Kitchen allopurinol (ZYLOPRIM) 300 MG tablet TAKE 1 TABLET (300 MG TOTAL) BY MOUTH DAILY. (Patient not taking: Reported on 12/18/2016) 30 tablet 0  . naproxen sodium (ANAPROX) 220 MG tablet Take 220 mg by mouth as needed.    . ondansetron (ZOFRAN ODT) 4 MG disintegrating tablet Take 1 tablet (4 mg total) by mouth every 8 (eight) hours as needed for nausea or vomiting. (Patient not taking: Reported on 08/07/2016) 20 tablet 0  . ondansetron (ZOFRAN) 8 MG tablet Take 1 tablet (8 mg total) by mouth 2 (two) times daily as needed for refractory nausea / vomiting. Start on day 3 after cyclophosphamide chemotherapy. (Patient not taking: Reported on  08/07/2016) 30 tablet 1   No current facility-administered medications for this visit.     Review of Systems:  GENERAL:  Feels fine. Active.  No fever, chills or sweats.  Weight up 14 pounds. PERFORMANCE STATUS (ECOG):  2 HEENT:  Deaf.  No visual changes, runny nose, sore throat, mouth sores or tenderness. Lungs: No shortness of breath or cough.  No hemoptysis. Cardiac:  No chest pain, palpitations, orthopnea, or PND.  GI: No nausea, vomiting, diarrhea, constipation, melena or hematochezia. GU:  No urgency, frequency, dysuria, or hematuria. Musculoskeletal:  Knee problems s/p cortisone injection.  No back pain.  No muscle tenderness. Extremities:  No pain or swelling. Skin:  No rashes or skin changes. Neuro:  Little numbness in fingertips. No headache, numbness or weakness, balance or coordination issues. Endocrine:  No diabetes, thyroid issues, hot flashes or night sweats. Psych:  No mood changes, depression or anxiety. Pain:  No focal pain. Review of systems:  All other systems reviewed and found to be negative.  Physical Exam:  Blood pressure (!) 146/89, pulse 83, temperature 97.9 F (36.6 C), temperature source Tympanic, resp. rate 18, weight 186 lb 6 oz (84.5 kg). GENERAL:  Elderly woman sitting comfortably in a wheelchair in the exam room in no acute distress.  Slow gait.  She needs assistance onto the table. MENTAL STATUS:  Alert and oriented to person, place and time. HEAD:  Pearline Cables hair.  Normocephalic, atraumatic, face symmetric, no Cushingoid features. EYES:  Hazel eyes.  Pupils equal round and reactive to light and accomodation.  No conjunctivitis or scleral icterus. ENT:  Oropharynx clear without lesion.  Upper dentures.  Tongue normal. Mucous membranes moist.  RESPIRATORY:  Clear to auscultation without rales, wheezes or rhonchi. CARDIOVASCULAR:  Regular rate and rhythm without murmur, rub or gallop. ABDOMEN:  Soft, non-tender, with active bowel sounds, and no appreciable  hepatosplenomegaly.  No masses. SKIN:  No rashes, ulcers or lesions. EXTREMITIES: No edema, no skin discoloration or tenderness.  No palpable cords. LYMPH NODES: No palpable cervical, supraclavicular, axillary or inguinal adenopathy  NEUROLOGICAL:  Lower extremity strength and sensation symmetric.  Bilateral patellar reflexes symmetric.  No foot drop (able to stand on heels). PSYCH:  Appropriate.   Infusion on 02/05/2017  Component Date Value Ref Range Status  . Sodium 02/05/2017 137  135 - 145 mmol/L Final  . Potassium 02/05/2017 3.8  3.5 - 5.1 mmol/L Final  . Chloride 02/05/2017 105  101 - 111 mmol/L Final  . CO2 02/05/2017 24  22 - 32 mmol/L Final  . Glucose, Bld 02/05/2017 111* 65 - 99 mg/dL Final  . BUN 02/05/2017 30* 6 - 20 mg/dL Final  . Creatinine, Ser 02/05/2017 1.36* 0.44 - 1.00 mg/dL Final  . Calcium 02/05/2017 9.0  8.9 - 10.3 mg/dL Final  . GFR calc non Af Amer 02/05/2017 38* >60 mL/min Final  . GFR calc Af Amer 02/05/2017 44* >60 mL/min Final   Comment: (NOTE) The eGFR has been calculated using the CKD EPI equation. This calculation has not been validated in all clinical situations. eGFR's persistently <60 mL/min signify possible Chronic Kidney Disease.   . Anion gap 02/05/2017 8  5 - 15 Final    Radiology studies: 02/20/2016:  Chest, abdomen and pelvic CT revealed a 3.7 cm subpleural mass within the left lower lobe, a 1.1 cm right lower lobe subpleural nodule , 9 mm RUL nodule, and 0.4 cm lingular nodule.  Between the left second, third, and fourth ribs there was 1.9 cm soft tissue mass. There was a 2.5 cm low-attenuation nodule in the left lobe of the thyroid.   There were prominent left axillary nodes (up to 1.0 cm).   There was no definite mediastinal adenopathy. There was a 5.5 x 4.6 cm cystic lesion in the left hepatic lobe.  There was a 3.1 cm nodule in the right adrenal gland. There was a 1.2 cm right retrocaval lymph node with additional prominent subcentimeter  retroperitoneal nodes. There was 1.0 cm left common iliac node. There was a 6.2 x 5.2 cm soft tissue mass within the right ilium.  02/21/2016:  Head MRI revealed no evidence of malignancy. Thyroid ultrasound on 02/21/2016 revealed a 2.4 cm solid nodule in the inferior aspect of the left lobe 03/03/2016:  PET scan revealed a hypermetabolic left lower lobe mass, bilateral pleural and pulmonary parenchymal nodular metastasis, a large pleural lesion invading the anterior left chest wall.   There was activity in the superior left ocular orbit.  There was a large lesion centered in the right iliac wing with soft tissue extension.  There were hypermetabolic right external iliac lymph nodes 06/06/2016:  PET scan revealed significant interval response to therapy.  There was significant decrease in size and FDG uptake associated with left lower lobe lung lesion.  There was resolution of left-sided pleural base metastasis and left axillary and anterior mediastinal nodal metastasis.  There was resolution of previous hypermetabolic soft tissue metastasis/nodal metastasis posterior to the IVC.  There was marked improvement in hypermetabolic metastasis involving the right iliac wing.  There was persistent right upper lobe lung nodule which exhibits intense radiotracer uptake which may represent a focus of metastatic disease or synchronous primary lung neoplasm. 06/01/2017:  PET scan revealed no new or progressive findings.  The 9 mm RUL pulmonary nodule remained hypermetabolic (SUV 85.2, previously 7.3) and no change in size.  The index lesion in the posterior left lower lobe was minimally smaller on CT imaging with very low level FDG uptake (SUV 1.6).  There was no change in the appearance at site of previous disease at the right iliac wing (SUV 1.9 compared to 2.6 previously). 01/29/2017:  PET scan revealed interval progression of hypermetabolic nodules in the right upper (13 mm compared to 9 mm; SUV 10) and left lower  lobes (16 mm compared to 11 mm; SUV 7.5).  There was new hypermetabolic focus of activity along the anterior left pleura although no underlying pleural or lung mass was evident.   Assessment:  Toni Griffith is a 76 y.o. female with stage IVB B-cell lymphoma, unclassifiable, with features intermediate between diffuse large B-cell lymphoma and classical Hodgkin's lymphoma ("gray zone" lymphoma).  She underwent right iliac wing bone/bone marrow biopsy on 03/08/2016.  Head MRI on 02/21/2016  revealed no evidence of malignancy. 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   There was activity in the superior left ocular orbit.  There was a large lesion centered in the right iliac wing with soft tissue extension.  There were hypermetabolic right external iliac lymph nodes  She was admitted to [facility] in [facility].  She had renal failure and hypercalcemia. She was treated with IVF, calcitonin and Zometa.The patient has a history of coronary artery disease s/p CABG in 1993.  She has no history of heart failure.  She is deaf and requires an interpreter.  Echo on 04/04/2016 revealed an EF of 55-60%.  Hepatitis B and C testing was negative on 03/30/2016.   G6PD assay was normal.  She declined LP with MTX prophylaxis.  She received 6 cycles of mini-RCHOP (04/07/2016 - 07/21/2016).  She has had some transient fingertip numbness.  She received radiation to the right iliac wing,  from 10/11/2016 - 11/01/2016.  She received 3000 cGy over 3 weeks.  PET scan on 01/29/2017 revealed interval progression of hypermetabolic nodules in the right upper (13 mm compared to 9 mm; SUV 10) and left lower lobes (16 mm compared to 11 mm; SUV 7.5).  There was new  hypermetabolic focus of activity along the anterior left pleura although no underlying pleural or lung mass was evident.  She has a normocytic anemia.  Labs on 04/17/2016 revealed an elevated ferritin (444), iron saturation (11%), low TIBC (240), B12 (2409), and folate (10.8).  Symptomatically, she feels good.  She denies any B symptoms.  She has some numbness and tingling in her fingertips.  Exam is unremarkable. Scans reveal 2 enlarging pulmonary nodules.  Potassium is 3.8.  Plan: 1.  Labs today:  CBC with diff,CMP, LDH, uric acid. 2.  Discuss interval PET scan and progressive disease.  Etiology of pulmonary nodules unclear. Nodules may represent recurrent lymphoma. Given her smoking history, she may have lung cancer. Discuseds obtaining a CT guided biopsy. Patient became quite upset. We spent considerable amount of time (>40 minutes) talking about her thoughts about therapy. We discussed treatment for relapsed lymphoma.  We discussed treatment of lung cancer in general term.  She stated that she had been through so much and is not sure that she wants additional treatment. After some time, she made the decision to pursue a biopsy. She will be presented at tumor board. 3.  Contact patient's daughter [name] [phone]) after tumor board. 4.  Anticipate CT guided lung biopsy. 5.  RTC after biopsy to discussion direction of therapy.    Lequita Asal, MD  02/05/2017, 4:27 PM

## 2017-02-05 NOTE — Progress Notes (Signed)
Patient wants to know if she is to continue allopurinol or should she stop it since her chemo is complete.  Patient also recently had a cortisone injection in her left knee.  Patient states she has some exertional SOB.  Patient here today for PET results.  Accompanied by her daughter, son and boyfriend as well as her interpreter.

## 2017-02-12 ENCOUNTER — Telehealth: Payer: Self-pay | Admitting: *Deleted

## 2017-02-12 NOTE — Telephone Encounter (Signed)
Called daughter to report to her that her moms ct biopsy was for Monday 7-23 at 0800 and to be npo after midnight, voiced understanding.

## 2017-02-16 ENCOUNTER — Other Ambulatory Visit: Payer: Self-pay | Admitting: Radiology

## 2017-02-19 ENCOUNTER — Other Ambulatory Visit (HOSPITAL_COMMUNITY): Payer: Self-pay | Admitting: Interventional Radiology

## 2017-02-19 ENCOUNTER — Ambulatory Visit
Admission: RE | Admit: 2017-02-19 | Discharge: 2017-02-19 | Disposition: A | Discharge status: Home / Self Care | Payer: Medicare HMO | Source: Ambulatory Visit | Attending: Interventional Radiology | Admitting: Interventional Radiology

## 2017-02-19 ENCOUNTER — Ambulatory Visit
Admission: RE | Admit: 2017-02-19 | Discharge: 2017-02-19 | Disposition: A | Discharge status: Home / Self Care | Payer: Medicare HMO | Source: Ambulatory Visit | Attending: Hematology and Oncology | Admitting: Hematology and Oncology

## 2017-02-19 DIAGNOSIS — C8580 Other specified types of non-Hodgkin lymphoma, unspecified site: Secondary | ICD-10-CM | POA: Diagnosis present

## 2017-02-19 DIAGNOSIS — Z87891 Personal history of nicotine dependence: Secondary | ICD-10-CM | POA: Diagnosis not present

## 2017-02-19 DIAGNOSIS — I10 Essential (primary) hypertension: Secondary | ICD-10-CM | POA: Insufficient documentation

## 2017-02-19 DIAGNOSIS — Z801 Family history of malignant neoplasm of trachea, bronchus and lung: Secondary | ICD-10-CM | POA: Diagnosis not present

## 2017-02-19 DIAGNOSIS — Z7982 Long term (current) use of aspirin: Secondary | ICD-10-CM | POA: Insufficient documentation

## 2017-02-19 DIAGNOSIS — Z833 Family history of diabetes mellitus: Secondary | ICD-10-CM | POA: Diagnosis not present

## 2017-02-19 DIAGNOSIS — C8260 Cutaneous follicle center lymphoma, unspecified site: Secondary | ICD-10-CM | POA: Insufficient documentation

## 2017-02-19 DIAGNOSIS — R911 Solitary pulmonary nodule: Secondary | ICD-10-CM | POA: Diagnosis not present

## 2017-02-19 DIAGNOSIS — C3432 Malignant neoplasm of lower lobe, left bronchus or lung: Secondary | ICD-10-CM | POA: Insufficient documentation

## 2017-02-19 DIAGNOSIS — Z9889 Other specified postprocedural states: Secondary | ICD-10-CM

## 2017-02-19 DIAGNOSIS — C8330 Diffuse large B-cell lymphoma, unspecified site: Secondary | ICD-10-CM

## 2017-02-19 DIAGNOSIS — Z951 Presence of aortocoronary bypass graft: Secondary | ICD-10-CM | POA: Diagnosis not present

## 2017-02-19 DIAGNOSIS — Z806 Family history of leukemia: Secondary | ICD-10-CM | POA: Diagnosis not present

## 2017-02-19 DIAGNOSIS — C8198 Hodgkin lymphoma, unspecified, lymph nodes of multiple sites: Secondary | ICD-10-CM

## 2017-02-19 DIAGNOSIS — Z79899 Other long term (current) drug therapy: Secondary | ICD-10-CM | POA: Diagnosis not present

## 2017-02-19 HISTORY — DX: Unspecified hearing loss, unspecified ear: H91.90

## 2017-02-19 LAB — CBC
HCT: 31.1 % — ABNORMAL LOW (ref 35.0–47.0)
HEMOGLOBIN: 10.7 g/dL — AB (ref 12.0–16.0)
MCH: 30.7 pg (ref 26.0–34.0)
MCHC: 34.3 g/dL (ref 32.0–36.0)
MCV: 89.4 fL (ref 80.0–100.0)
Platelets: 311 10*3/uL (ref 150–440)
RBC: 3.48 MIL/uL — AB (ref 3.80–5.20)
RDW: 17.3 % — ABNORMAL HIGH (ref 11.5–14.5)
WBC: 8.6 10*3/uL (ref 3.6–11.0)

## 2017-02-19 LAB — PROTIME-INR
INR: 1.01
Prothrombin Time: 13.3 seconds (ref 11.4–15.2)

## 2017-02-19 LAB — APTT: aPTT: 43 seconds — ABNORMAL HIGH (ref 24–36)

## 2017-02-19 MED ORDER — HYDROCODONE-ACETAMINOPHEN 5-325 MG PO TABS
1.0000 | ORAL_TABLET | Freq: Once | ORAL | Status: AC
  Administered 2017-02-19 (×2): 1 via ORAL
  Filled 2017-02-19: qty 2

## 2017-02-19 MED ORDER — HYDROCODONE-ACETAMINOPHEN 5-325 MG PO TABS
ORAL_TABLET | ORAL | Status: AC
  Filled 2017-02-19: qty 1

## 2017-02-19 MED ORDER — HEPARIN SOD (PORK) LOCK FLUSH 100 UNIT/ML IV SOLN
INTRAVENOUS | Status: AC
  Filled 2017-02-19: qty 5

## 2017-02-19 MED ORDER — MIDAZOLAM HCL 5 MG/5ML IJ SOLN
INTRAMUSCULAR | Status: AC
  Filled 2017-02-19: qty 5

## 2017-02-19 MED ORDER — MIDAZOLAM HCL 5 MG/5ML IJ SOLN
INTRAMUSCULAR | Status: AC | PRN
  Administered 2017-02-19 (×2): 0.5 mg via INTRAVENOUS
  Administered 2017-02-19: 1 mg via INTRAVENOUS

## 2017-02-19 MED ORDER — FENTANYL CITRATE (PF) 100 MCG/2ML IJ SOLN
INTRAMUSCULAR | Status: AC | PRN
  Administered 2017-02-19 (×3): 25 ug via INTRAVENOUS

## 2017-02-19 MED ORDER — FENTANYL CITRATE (PF) 100 MCG/2ML IJ SOLN
INTRAMUSCULAR | Status: AC
  Filled 2017-02-19: qty 4

## 2017-02-19 MED ORDER — SODIUM CHLORIDE 0.9 % IV SOLN
INTRAVENOUS | Status: DC
  Administered 2017-02-19: 09:00:00 via INTRAVENOUS

## 2017-02-19 NOTE — Procedures (Signed)
Interventional Radiology Procedure Note  Procedure: CT guided biopsy of LLL lung nodule  Complications: None   Estimated Blood Loss: < 10 mL  CT guided core biopsy performed of 2.8 cm LLL lung nodule.  18 G core biopsy x 3 via 17 G needle.  No PTX or hemorrhage post biopsy. Biosentry device utilized.  Venetia Night. Kathlene Cote, M.D Pager:  [phone]

## 2017-02-19 NOTE — H&P (Signed)
Chief Complaint: Patient was seen in consultation today for lung biopsy at the request of [place] C  Referring Physician(s): [place] C  Patient Status: [facility] - Out-pt  History of Present Illness: Toni Griffith is a 76 y.o. female with a history of lymphoma with enlarging bilateral hypermetabolic pulmonary nodules of RUL and LLL. LLL nodule is larger, peripheral, and measures 2.6-2.7 cm.  Presents for biopsy to determine if pulmonary nodule is lymphoma recurrence or possibly lung carcinoma.    Past Medical History:  Diagnosis Date  . Arthritis   . 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 Deaf   . Hypertension     Past Surgical History:  Procedure Laterality Date  . CARDIAC SURGERY  1993  . CORONARY ARTERY BYPASS GRAFT    . CYST REMOVAL HAND    . PERIPHERAL VASCULAR CATHETERIZATION N/A 04/06/2016   Procedure: Glori Luis Cath Insertion;  Surgeon: Algernon Huxley, MD;  Location: [facility];  Service: Cardiovascular;  Laterality: N/A;    Allergies: Patient has no known allergies.  Medications: Prior to Admission medications   Medication Sig Start Date End Date Taking? Authorizing Provider  allopurinol (ZYLOPRIM) 300 MG tablet TAKE 1 TABLET (300 MG TOTAL) BY MOUTH DAILY. 10/21/16  Yes Lequita Asal, MD  aspirin 325 MG tablet Take 325 mg by mouth daily.   Yes [provider]  atorvastatin (LIPITOR) 80 MG tablet Take 80 mg by mouth. 10/14/15 02/19/17 Yes [provider]  B Complex Vitamins (B COMPLEX 1 PO) Take by mouth.   Yes [provider]  Cholecalciferol (VITAMIN D3) 2000 units capsule Take 2,000 Units by mouth daily.   Yes [provider]  gabapentin (NEURONTIN) 100 MG capsule  04/13/16  Yes [provider]  KLOR-CON 10 10 MEQ tablet TAKE 1 TABLET (10 MEQ TOTAL) BY MOUTH 2 (TWO) TIMES DAILY. 10/06/16  Yes Lequita Asal, MD  lidocaine-prilocaine (EMLA) cream Apply cream 1 hour before chemotherapy treatment and  place small peive of saran wrap over cream to protect clothing 04/06/16  Yes Corcoran, Melissa C, MD  naproxen sodium (ANAPROX) 220 MG tablet Take 220 mg by mouth as needed.   Yes [provider]  pantoprazole (PROTONIX) 40 MG tablet Take 40 mg by mouth. 02/04/16  Yes [provider]  triamterene-hydrochlorothiazide (DYAZIDE) 37.5-25 MG capsule Take by mouth. 10/14/15 02/19/17 Yes [provider]  vitamin B-12 (CYANOCOBALAMIN) 500 MCG tablet Take 500 mcg by mouth daily.   Yes [provider]  vitamin E 400 UNIT capsule Take by mouth.   Yes [provider]  acetaminophen (TYLENOL) 325 MG tablet Take 650 mg by mouth.    [provider]  allopurinol (ZYLOPRIM) 300 MG tablet Take 1 tablet (300 mg total) by mouth daily. 12/18/16   Lequita Asal, MD  ondansetron (ZOFRAN ODT) 4 MG disintegrating tablet Take 1 tablet (4 mg total) by mouth every 8 (eight) hours as needed for nausea or vomiting. Patient not taking: Reported on 08/07/2016 04/24/16   Loney Hering, MD  ondansetron (ZOFRAN) 8 MG tablet Take 1 tablet (8 mg total) by mouth 2 (two) times daily as needed for refractory nausea / vomiting. Start on day 3 after cyclophosphamide chemotherapy. Patient not taking: Reported on 08/07/2016 04/06/16   Lequita Asal, MD  potassium chloride (KLOR-CON 10) 10 MEQ tablet Take 1 tablet (10 mEq total) by mouth daily. 07/21/16   Sindy Guadeloupe, MD     Family History  Problem Relation Age of Onset  . Lung cancer Mother   . Lung cancer Father   . Diabetes Maternal Uncle   . Myasthenia gravis Maternal Grandmother   . Leukemia Maternal Grandfather     Social History   Social History  . Marital status: Single    Spouse name: N/A  . Number of children: N/A  . Years of education: N/A   Social History Main Topics  . Smoking status: Former Smoker    Quit date: 11/21/2015  . Smokeless tobacco: Never Used  . Alcohol use Yes     Comment: rare  . Drug  use: No  . Sexual activity: No   Other Topics Concern  . None   Social History Narrative  . None    ECOG Status: 0 - Asymptomatic  Review of Systems: A 12 point ROS discussed and pertinent positives are indicated in the HPI above.  All other systems are negative.  Review of Systems  Constitutional: Negative.   HENT: Negative.   Respiratory: Negative.   Cardiovascular: Negative.   Gastrointestinal: Negative.   Genitourinary: Negative.   Musculoskeletal: Positive for arthralgias. Negative for back pain, myalgias and neck pain.       Chronic bilateral knee pain  Neurological: Negative.     Vital Signs: BP (!) 146/74   Pulse 83   Temp 98.6 F (37 C) (Oral)   Resp (!) 22   Ht 5\' 3"  (1.6 m)   SpO2 96%   Physical Exam  Constitutional: She is oriented to person, place, and time. She appears well-developed and well-nourished. No distress.  HENT:  Head: Normocephalic and atraumatic.  Neck: Neck supple. No JVD present. No tracheal deviation present.  Cardiovascular: Normal rate, regular rhythm and normal heart sounds.  Exam reveals no gallop and no friction rub.   No murmur heard. Pulmonary/Chest: Effort normal and breath sounds normal. No stridor. No respiratory distress. She has no wheezes. She has no rales.  Abdominal: Soft. Bowel sounds are normal. She exhibits no distension and no mass. There is no tenderness. There is no rebound and no guarding.  Musculoskeletal: She exhibits no edema.  Lymphadenopathy:    She has no cervical adenopathy.  Neurological: She is alert and oriented to person, place, and time.  Skin: Skin is warm and dry. She is not diaphoretic.  Vitals reviewed.   Mallampati Score:  MD Evaluation Airway: WNL Heart: WNL Abdomen: WNL Chest/ Lungs: WNL ASA  Classification: 3 Mallampati/Airway Score: One  Imaging: Nm Pet Image Restag (ps) Skull Base To Thigh  Result Date: 01/29/2017 CLINICAL DATA:  Subsequent treatment strategy for diffuse large  cell non-Hodgkin's lymphoma. EXAM: NUCLEAR MEDICINE PET SKULL BASE TO THIGH TECHNIQUE: 13.1 mCi F-18 FDG was injected intravenously. Full-ring PET imaging was performed from the skull base to thigh after the radiotracer. CT data was obtained and used for attenuation correction and anatomic localization. FASTING BLOOD GLUCOSE:  Value: 102 mg/dl COMPARISON:  08/01/2016. FINDINGS: NECK No hypermetabolic lymph nodes in the neck. CHEST Right upper lobe hypermetabolic pulmonary nodule has increased in size measuring 13 mm today compared to 9 mm on the prior study. SUV max = 10 on today's exam compared to 7 previously. Index lesion in the peripheral left lower lobe has progressed substantially in the interval. This lesion was an 11 mm cavitary abnormality on the prior study. On today's exam it is a 2.6 cm confluent soft tissue lesion with SUV max = 7.5 compared to 2.2 on the prior study. On  today's study, a focal area of hypermetabolic accumulation is identified along the antro lateral left pleura (between the anterior left third and fourth ribs. No underlying soft tissue abnormality is evident although FDG uptake in this region is 3.4. No hypermetabolic mediastinal or hilar nodes. No suspicious pulmonary nodules on the CT scan. ABDOMEN/PELVIS No abnormal hypermetabolic activity within the liver, pancreas, or spleen. Small to moderate hiatal hernia again noted. 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 There is abdominal aortic atherosclerosis without aneurysm. SKELETON No focal hypermetabolic activity to suggest skeletal metastasis. No hypermetabolic activity in the right iliac bone as noted on prior studies. Stable advanced degenerative changes in each hip. IMPRESSION: 1. Interval progression of hypermetabolic nodules in the right upper and left lower lobes. 2. New hypermetabolic focus of activity along the anterior left pleura although no underlying pleural or lung mass is evident. 3. Stable  appearance right iliac region. 4.  Aortic Atherosclerois (ICD10-170.0) Electronically Signed   By: Misty Stanley M.D.   On: 01/29/2017 13:36    Labs:  CBC:  Recent Labs  08/07/16 1437 10/19/16 0840 12/18/16 1234 02/19/17 0816  WBC 3.8 7.0 8.7 8.6  HGB 10.5* 11.1* 11.9* 10.7*  HCT 31.6* 32.5* 34.4* 31.1*  PLT 318 326 330 311    COAGS:  Recent Labs  03/30/16 1347 02/19/17 0816  INR 1.01 1.01  APTT 36 43*    BMP:  Recent Labs  10/19/16 0840 12/04/16 0950 12/18/16 1234 02/05/17 1535  NA 136 133* 135 137  K 3.6 3.1* 3.9 3.8  CL 105 102 101 105  CO2 26 23 25 24   GLUCOSE 119* 88 103* 111*  BUN 21* 26* 23* 30*  CALCIUM 8.8* 8.9 9.3 9.0  CREATININE 1.08* 1.15* 1.30* 1.36*  GFRNONAA 50* 46* 40* 38*  GFRAA 58* 53* 46* 44*    LIVER FUNCTION TESTS:  Recent Labs  06/30/16 0856 07/21/16 0820 08/07/16 1437 12/18/16 1234  BILITOT 0.6 0.6 0.7 1.0  AST 29 25 28 27   ALT 18 16 19 24   ALKPHOS 67 71 62 84  PROT 6.1* 6.1* 6.2* 6.8  ALBUMIN 3.2* 3.1* 3.3* 3.6    Assessment and Plan:  For CT guided biopsy of LLL lung nodule today.  Risks and benefits discussed with the patient and her family including, but not limited to bleeding, hemoptysis, respiratory failure requiring intubation, infection, pneumothorax requiring chest tube placement, stroke from air embolism or even death. All of the patient's questions were answered, patient is agreeable to proceed. Consent signed and in chart. A sign language interpretor was utilized today for consent and will also be utilized during the procedure.  Thank you for this interesting consult.  I greatly enjoyed meeting Toni Griffith and look forward to participating in their care.  A copy of this report was sent to the requesting provider on this date.  Electronically Signed: Azzie Roup, MD 02/19/2017, 9:28 AM   I spent a total of  30 Minutes in face to face in clinical consultation, greater than 50% of which was  counseling/coordinating care for lung biopsy.

## 2017-02-20 ENCOUNTER — Other Ambulatory Visit: Payer: Self-pay | Admitting: Pathology

## 2017-02-20 LAB — SURGICAL PATHOLOGY

## 2017-02-23 ENCOUNTER — Encounter: Payer: Self-pay | Admitting: *Deleted

## 2017-02-23 ENCOUNTER — Inpatient Hospital Stay (HOSPITAL_BASED_OUTPATIENT_CLINIC_OR_DEPARTMENT_OTHER): Payer: Medicare HMO | Admitting: Hematology and Oncology

## 2017-02-23 ENCOUNTER — Encounter: Payer: Self-pay | Admitting: Hematology and Oncology

## 2017-02-23 VITALS — BP 112/70 | HR 87 | Temp 96.2°F | Resp 18 | Wt 190.0 lb

## 2017-02-23 DIAGNOSIS — I2510 Atherosclerotic heart disease of native coronary artery without angina pectoris: Secondary | ICD-10-CM

## 2017-02-23 DIAGNOSIS — C8330 Diffuse large B-cell lymphoma, unspecified site: Secondary | ICD-10-CM

## 2017-02-23 DIAGNOSIS — R200 Anesthesia of skin: Secondary | ICD-10-CM

## 2017-02-23 DIAGNOSIS — E876 Hypokalemia: Secondary | ICD-10-CM | POA: Diagnosis not present

## 2017-02-23 DIAGNOSIS — Z801 Family history of malignant neoplasm of trachea, bronchus and lung: Secondary | ICD-10-CM

## 2017-02-23 DIAGNOSIS — C8580 Other specified types of non-Hodgkin lymphoma, unspecified site: Secondary | ICD-10-CM | POA: Diagnosis not present

## 2017-02-23 DIAGNOSIS — C3432 Malignant neoplasm of lower lobe, left bronchus or lung: Secondary | ICD-10-CM

## 2017-02-23 DIAGNOSIS — Z923 Personal history of irradiation: Secondary | ICD-10-CM

## 2017-02-23 DIAGNOSIS — Z79899 Other long term (current) drug therapy: Secondary | ICD-10-CM

## 2017-02-23 DIAGNOSIS — Z87891 Personal history of nicotine dependence: Secondary | ICD-10-CM | POA: Diagnosis not present

## 2017-02-23 DIAGNOSIS — Z951 Presence of aortocoronary bypass graft: Secondary | ICD-10-CM

## 2017-02-23 DIAGNOSIS — E041 Nontoxic single thyroid nodule: Secondary | ICD-10-CM | POA: Diagnosis not present

## 2017-02-23 DIAGNOSIS — D649 Anemia, unspecified: Secondary | ICD-10-CM

## 2017-02-23 DIAGNOSIS — I10 Essential (primary) hypertension: Secondary | ICD-10-CM

## 2017-02-23 DIAGNOSIS — M129 Arthropathy, unspecified: Secondary | ICD-10-CM | POA: Diagnosis not present

## 2017-02-23 DIAGNOSIS — R918 Other nonspecific abnormal finding of lung field: Secondary | ICD-10-CM | POA: Diagnosis not present

## 2017-02-23 DIAGNOSIS — Z7982 Long term (current) use of aspirin: Secondary | ICD-10-CM

## 2017-02-23 DIAGNOSIS — C3492 Malignant neoplasm of unspecified part of left bronchus or lung: Secondary | ICD-10-CM

## 2017-02-23 DIAGNOSIS — Z806 Family history of leukemia: Secondary | ICD-10-CM

## 2017-02-23 DIAGNOSIS — C8198 Hodgkin lymphoma, unspecified, lymph nodes of multiple sites: Secondary | ICD-10-CM

## 2017-02-23 NOTE — Progress Notes (Signed)
Patient offers no complaints today.  No interpreter today.  Will have to use telecommunication computer.  In order to have interpreter we now need it stated in MD wrap up note.

## 2017-02-23 NOTE — Progress Notes (Signed)
.[facility] day:  02/23/2017   Chief Complaint: Laryn Toni Griffith is a 76 y.o. female with a gray zone lymphoma who is seen for review of interval CT guided biopsy and discussion regarding direction of therapy.  HPI:  The patient was last seen in the medical oncology clinic on 02/05/2017.  At that time, she felt fine.  She denied any B symptoms.   PET scan revealed interval progression of hypermetabolic nodules in the right upper (13 mm compared to 9 mm; SUV 10) and left lower lobes (26 mm compared to 22 mm; SUV 7.5).  We discussed obtaining a biopsy of the larger lesion.  She underwent CT guided biopsy of the left lung nodule on 02/19/2017.  Pathology confirmed squamous cell carcinoma.  Symptomatically, she denies any complaint.   Past Medical History:  Diagnosis Date  . Arthritis   . Cancer [facility])    lymphoma-right hip  . Deaf   . Hypertension     Past Surgical History:  Procedure Laterality Date  . CARDIAC SURGERY  1993  . CORONARY ARTERY BYPASS GRAFT    . CYST REMOVAL HAND    . PERIPHERAL VASCULAR CATHETERIZATION N/A 04/06/2016   Procedure: Glori Luis Cath Insertion;  Surgeon: Algernon Huxley, MD;  Location: [facility];  Service: Cardiovascular;  Laterality: N/A;    Family History  Problem Relation Age of Onset  . Lung cancer Mother   . Lung cancer Father   . Diabetes Maternal Uncle   . Myasthenia gravis Maternal Grandmother   . Leukemia Maternal Grandfather     Social History:  reports that she quit smoking about 15 months ago. She has never used smokeless tobacco. She reports that she drinks alcohol. She reports that she does not use drugs.  She smoked 1 1/2 packs per day for 20 years (30 pack year smoking history) until 10/2015.  She is hearing impaired (deaf).  She lives in [place] with her boyfriend.  Her daughter lives in [place]. The patient is accompanied by her son, daughter, and the interpreter (sign language)  today.  Allergies: No Known Allergies  Current Medications: Current Outpatient Prescriptions  Medication Sig Dispense Refill  . acetaminophen (TYLENOL) 325 MG tablet Take 650 mg by mouth.    Marland Kitchen allopurinol (ZYLOPRIM) 300 MG tablet TAKE 1 TABLET (300 MG TOTAL) BY MOUTH DAILY. 30 tablet 0  . allopurinol (ZYLOPRIM) 300 MG tablet Take 1 tablet (300 mg total) by mouth daily. 30 tablet 5  . aspirin 325 MG tablet Take 325 mg by mouth daily.    . B Complex Vitamins (B COMPLEX 1 PO) Take by mouth.    . Cholecalciferol (VITAMIN D3) 2000 units capsule Take 2,000 Units by mouth daily.    Marland Kitchen gabapentin (NEURONTIN) 100 MG capsule     . KLOR-CON 10 10 MEQ tablet TAKE 1 TABLET (10 MEQ TOTAL) BY MOUTH 2 (TWO) TIMES DAILY. 60 tablet 0  . lidocaine-prilocaine (EMLA) cream Apply cream 1 hour before chemotherapy treatment and place small peive of saran wrap over cream to protect clothing 30 g 1  . naproxen sodium (ANAPROX) 220 MG tablet Take 220 mg by mouth as needed.    . pantoprazole (PROTONIX) 40 MG tablet Take 40 mg by mouth.    . potassium chloride (KLOR-CON 10) 10 MEQ tablet Take 1 tablet (10 mEq total) by mouth daily. 30 tablet 1  . vitamin B-12 (CYANOCOBALAMIN) 500 MCG tablet Take 500 mcg by mouth daily.    Marland Kitchen  vitamin E 400 UNIT capsule Take by mouth.    Marland Kitchen atorvastatin (LIPITOR) 80 MG tablet Take 80 mg by mouth.    . ondansetron (ZOFRAN ODT) 4 MG disintegrating tablet Take 1 tablet (4 mg total) by mouth every 8 (eight) hours as needed for nausea or vomiting. (Patient not taking: Reported on 08/07/2016) 20 tablet 0  . ondansetron (ZOFRAN) 8 MG tablet Take 1 tablet (8 mg total) by mouth 2 (two) times daily as needed for refractory nausea / vomiting. Start on day 3 after cyclophosphamide chemotherapy. (Patient not taking: Reported on 08/07/2016) 30 tablet 1  . triamterene-hydrochlorothiazide (DYAZIDE) 37.5-25 MG capsule Take by mouth.     No current facility-administered medications for this visit.     Review  of Systems:  GENERAL:  Feels fine. No fever, chills or sweats.  Weight up 4 pounds. PERFORMANCE STATUS (ECOG):  2 HEENT:  Deaf.  No visual changes, runny nose, sore throat, mouth sores or tenderness. Lungs: No shortness of breath or cough.  No hemoptysis. Cardiac:  No chest pain, palpitations, orthopnea, or PND.  GI: No nausea, vomiting, diarrhea, constipation, melena or hematochezia. GU:  No urgency, frequency, dysuria, or hematuria. Musculoskeletal:  Chronic knee problems.  No back pain.  No muscle tenderness. Extremities:  No pain or swelling. Skin:  No rashes or skin changes. Neuro:  Little numbness in fingertips. No headache, numbness or weakness, balance or coordination issues. Endocrine:  No diabetes, thyroid issues, hot flashes or night sweats. Psych:  No mood changes, depression or anxiety. Pain:  No focal pain. Review of systems:  All other systems reviewed and found to be negative.  Physical Exam:  Blood pressure 112/70, pulse 87, temperature (!) 96.2 F (35.7 C), temperature source Tympanic, resp. rate 18, weight 190 lb (86.2 kg). GENERAL:  Elderly woman sitting comfortably in a wheelchair in the exam room in no acute distress.  Slow gait.  She needs assistance onto the table. MENTAL STATUS:  Alert and oriented to person, place and time. HEAD:  Pearline Cables hair.  Normocephalic, atraumatic, face symmetric, no Cushingoid fe Eyes: Hazel eyes.  No conjunctivitis or scleral icterus.  RESPIRATORY:  Clear to auscultation without rales, wheezes or rhonchi. CARDIOVASCULAR:  Regular rate and rhythm without murmur, rub or gallop. EXTREMITIES: No edema, no skin discoloration or tenderness.  No palpable cords. LYMPH NODES: No palpable cervical, supraclavicular, or axillary adenopathy  NEUROLOGICAL:  Appropriate. PSYCH:  Appropriate.   No visits with results within 3 Day(s) from this visit.  Latest known visit with results is:  Hospital Outpatient Visit on 02/19/2017  Component Date Value  Ref Range Status  . aPTT 02/19/2017 43* 24 - 36 seconds Final   Comment:        IF BASELINE aPTT IS ELEVATED, SUGGEST PATIENT RISK ASSESSMENT BE USED TO DETERMINE APPROPRIATE ANTICOAGULANT THERAPY.   . WBC 02/19/2017 8.6  3.6 - 11.0 K/uL Final  . RBC 02/19/2017 3.48* 3.80 - 5.20 MIL/uL Final  . Hemoglobin 02/19/2017 10.7* 12.0 - 16.0 g/dL Final  . HCT 02/19/2017 31.1* 35.0 - 47.0 % Final  . MCV 02/19/2017 89.4  80.0 - 100.0 fL Final  . MCH 02/19/2017 30.7  26.0 - 34.0 pg Final  . MCHC 02/19/2017 34.3  32.0 - 36.0 g/dL Final  . RDW 02/19/2017 17.3* 11.5 - 14.5 % Final  . Platelets 02/19/2017 311  150 - 440 K/uL Final  . Prothrombin Time 02/19/2017 13.3  11.4 - 15.2 seconds Final  . INR 02/19/2017 1.01  Final  . SURGICAL PATHOLOGY 02/19/2017    Final                   Value:Surgical Pathology CASE: [id number] PATIENT: Brett Canales Surgical Pathology Report     SPECIMEN SUBMITTED: A. Lung, left nodule  CLINICAL HISTORY: History of treated lymphoma with enlarging RUL and LLL lung nodules, hypermetabolic by PET scan  PRE-OPERATIVE DIAGNOSIS: Pulmonary recurrence of lymphoma versus primary lung carcinoma or metastatic carcinoma  POST-OPERATIVE DIAGNOSIS: None provided.     DIAGNOSIS: A. LUNG NODULE, LEFT LOWER LOBE; CT-GUIDED BIOPSY: - SQUAMOUS CELL CARCINOMA.  Note: Sufficient material is available for additional studies.   GROSS DESCRIPTION:  A. Labeled: left lung biopsy  Tissue fragment(s): multiple  Size: aggregate, 0.8 x 0.1 x 0.1 cm  Description: pink to tan fragments in formalin, wrapped in lens paper marked orange and submitted in a mesh bag  Entirely submitted in 1 cassette(s).   B. Labeled: left lung biopsy  Tissue fragment(s): 1 core fresh  Size: 1.0 x 0.1 x 0.1 cm  Description: tan core, touch                          prep prepared, following touch preparation entirely formalin fixed, wrapped in lens paper marked orange  and submitted in a mesh bag  Entirely submitted in 1 cassette(s).    Final Diagnosis performed by Delorse Lek, MD.  Electronically signed 02/20/2017 5:03:08PM    The electronic signature indicates that the named Attending Pathologist has evaluated the specimen  Technical component performed at [facility], [address] [place], [place] [address] Lab: [phone] Dir: Darrick Penna. Evette Doffing, MD  Professional component performed at [facility], [employer], [place], [place], [place] [address] Lab: [phone] Dir: Dellia Nims. Rubinas, MD      Radiology studies: 02/20/2016:  Chest, abdomen and pelvic CT revealed a 3.7 cm subpleural mass within the left lower lobe, a 1.1 cm right lower lobe subpleural nodule , 9 mm RUL nodule, and 0.4 cm lingular nodule.  Between the left second, third, and fourth ribs there was 1.9 cm soft tissue mass. There was a 2.5 cm low-attenuation nodule in the left lobe of the thyroid.   There were prominent left axillary nodes (up to 1.0 cm).   There was no definite mediastinal adenopathy. There was a 5.5 x 4.6 cm cystic lesion in the left hepatic lobe.  There was a 3.1 cm nodule in the right adrenal gland. There was a 1.2 cm right retrocaval lymph node with additional prominent subcentimeter retroperitoneal nodes. There was 1.0 cm left common iliac node. There was a 6.2 x 5.2 cm soft tissue mass within the right ilium.  02/21/2016:  Head MRI revealed no evidence of malignancy. Thyroid ultrasound on 02/21/2016 revealed a 2.4 cm solid nodule in the inferior aspect of the left lobe 03/03/2016:  PET scan revealed a hypermetabolic left lower lobe mass, bilateral pleural and pulmonary parenchymal nodular metastasis, a large pleural lesion invading the anterior left chest wall.   There was activity in the superior left ocular orbit.  There was a large lesion centered in the right iliac wing with soft tissue extension.  There were hypermetabolic right external  iliac lymph nodes 06/06/2016:  PET scan revealed significant interval response to therapy.  There was significant decrease in size and FDG uptake associated with left lower lobe lung lesion.  There was resolution of left-sided pleural base metastasis and left axillary  and anterior mediastinal nodal metastasis.  There was resolution of previous hypermetabolic soft tissue metastasis/nodal metastasis posterior to the IVC.  There was marked improvement in hypermetabolic metastasis involving the right iliac wing.  There was persistent right upper lobe lung nodule which exhibits intense radiotracer uptake which may represent a focus of metastatic disease or synchronous primary lung neoplasm. 06/01/2017:  PET scan revealed no new or progressive findings.  The 9 mm RUL pulmonary nodule remained hypermetabolic (SUV 82.9, previously 7.3) and no change in size.  The index lesion in the posterior left lower lobe was minimally smaller on CT imaging with very low level FDG uptake (SUV 1.6).  There was no change in the appearance at site of previous disease at the right iliac wing (SUV 1.9 compared to 2.6 previously). 01/29/2017:  PET scan revealed interval progression of hypermetabolic nodules in the right upper (13 mm compared to 9 mm; SUV 10) and left lower lobes (26 mm compared to 22 mm; SUV 7.5).  There was new hypermetabolic focus of activity along the anterior left pleura although no underlying pleural or lung mass was evident.   Assessment:  Latosha Gaylord is a 76 y.o. female with stage IVB B-cell lymphoma, unclassifiable, with features intermediate between diffuse large B-cell lymphoma and classical Hodgkin's lymphoma ("gray zone" lymphoma).  She underwent right iliac wing bone/bone marrow biopsy on 03/08/2016.  Head MRI on 02/21/2016 revealed no evidence of malignancy. Thyroid ultrasound on 02/21/2016 revealed a 2.4 cm solid nodule in the inferior aspect of the left lobe  PET scan on 03/03/2016 revealed a  hypermetabolic left lower lobe mass, bilateral pleural and pulmonary parenchymal nodular metastasis, a large pleural lesion invading the anterior left chest wall.   There was activity in the superior left ocular orbit.  There was a large lesion centered in the right iliac wing with soft tissue extension.  There were hypermetabolic right external iliac lymph nodes  She was admitted to [facility] in [facility].  She had renal failure and hypercalcemia. She was treated with IVF, calcitonin and Zometa.The patient has a history of coronary artery disease s/p CABG in 1993.  She has no history of heart failure.  She is deaf and requires an interpreter.  Echo on 04/04/2016 revealed an EF of 55-60%.  Hepatitis B and C testing was negative on 03/30/2016.   G6PD assay was normal.  She declined LP with MTX prophylaxis.  She received 6 cycles of mini-RCHOP (04/07/2016 - 07/21/2016).  She has had some transient fingertip numbness.  She received radiation to the right iliac wing,  from 10/11/2016 - 11/01/2016.  She received 3000 cGy over 3 weeks.  PET scan on 01/29/2017 revealed interval progression of hypermetabolic nodules in the right upper (13 mm compared to 9 mm; SUV 10) and left lower lobes (26 mm compared to 22 mm; SUV 7.5).  There was new hypermetabolic focus of activity along the anterior left pleura although no underlying pleural or lung mass was evident.  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  She has a normocytic anemia.  Labs on 04/17/2016 revealed an elevated ferritin (444), iron saturation (11%), low TIBC (240), B12 (2409), and folate (10.8).  Symptomatically, she feels good.  She denies any B symptoms.  Exam is unremarkable. Scans reveal 2 enlarging pulmonary nodules.  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  Plan: 1.  Request video interpreter for sign language. 2.  Discuss results  of left lower lobe  lung nodule biopsy.   Pathology revealed squamous cell lung cancer. 3.  Discuss inability to biopsy right upper lobe lesion per interventional radiology.  Discuss tumor board discussions.  Recommendation for stereotactic radiosurgery.  Unclear if she is a surgical candidate given the unclear right lung nodule and a history of stage IV lymphoma.  Offered evaluation by thoracic surgery.  Patient declines.  Consult radiation oncology. 4.  Consult radiation oncology. 5.  Port flush every 4-6 weeks. 6.  RTC for MD assessment after completion of radiation. 7.  At next port flush, check labs (CBC with diff, BMP, uric acid). 8.  Anticipate next PET scan on 08/01/2017.   Lequita Asal, MD  02/23/2017, 10:46 AM

## 2017-02-23 NOTE — Progress Notes (Signed)
  Oncology Nurse Navigator Documentation  Navigator Location: [facility] (02/23/17 1100)   )Navigator Encounter Type: Follow-up Appt;Diagnostic Results (02/23/17 1100)   Abnormal Finding Date: 01/29/17 (02/23/17 1100) Confirmed Diagnosis Date: 02/20/17 (02/23/17 1100)                 Treatment Phase: Pre-Tx/Tx Discussion (02/23/17 1100) Barriers/Navigation Needs: No barriers at this time (02/23/17 1100)   Interventions: Coordination of Care (02/23/17 1100)   Coordination of Care: Appts (02/23/17 1100)        Acuity: Level 2 (02/23/17 1100)   Acuity Level 2: Initial guidance, education and coordination as needed;Educational needs (02/23/17 1100)    met with patient during follow up visit with [name] to review pathology results and discuss treatment planning. All questions answered at the time of visit via video interpreter. Education materials and info regarding supportive services given to pt and family. Reviewed upcoming appts and confirmed interpreter will be present for next appt. Contact info given and informed pt and family to contact me if has any further questions or needs. Pt and family verbalized understanding.   Time Spent with Patient: 60 (02/23/17 1100)

## 2017-02-26 LAB — PATHOLOGY

## 2017-03-07 ENCOUNTER — Encounter: Payer: Self-pay | Admitting: *Deleted

## 2017-03-07 ENCOUNTER — Ambulatory Visit
Admission: RE | Admit: 2017-03-07 | Discharge: 2017-03-07 | Disposition: A | Discharge status: Home / Self Care | Payer: Medicare HMO | Source: Ambulatory Visit | Attending: Radiation Oncology | Admitting: Radiation Oncology

## 2017-03-07 ENCOUNTER — Encounter: Payer: Self-pay | Admitting: Radiation Oncology

## 2017-03-07 VITALS — BP 130/89 | HR 82 | Temp 97.9°F | Resp 18 | Wt 192.7 lb

## 2017-03-07 DIAGNOSIS — C3432 Malignant neoplasm of lower lobe, left bronchus or lung: Secondary | ICD-10-CM | POA: Insufficient documentation

## 2017-03-07 DIAGNOSIS — Z923 Personal history of irradiation: Secondary | ICD-10-CM | POA: Diagnosis not present

## 2017-03-07 DIAGNOSIS — C8332 Diffuse large B-cell lymphoma, intrathoracic lymph nodes: Secondary | ICD-10-CM | POA: Diagnosis not present

## 2017-03-07 DIAGNOSIS — Z510 Encounter for antineoplastic radiation therapy: Secondary | ICD-10-CM | POA: Diagnosis not present

## 2017-03-07 DIAGNOSIS — Z87891 Personal history of nicotine dependence: Secondary | ICD-10-CM | POA: Diagnosis not present

## 2017-03-07 DIAGNOSIS — C3492 Malignant neoplasm of unspecified part of left bronchus or lung: Secondary | ICD-10-CM

## 2017-03-07 NOTE — Progress Notes (Signed)
Radiation Oncology Follow up Note Old patient new area  Name: Toni Griffith   Date:   03/07/2017 MRN:  [id number] DOB: 05/24/1941    This 76 y.o. female presents to the clinic today for evaluation for SB RT to right lung.  REFERRING PROVIDER: Dionne Bucy., MD  HPI: patient is a 76 year old deaf female well-known to department having been treated back in April 2018 with involved field radiation therapy to reduce for diffuse B-cell lymphoma with a large right iliac wing lesion..she is been followed with PET/CT scans and is doing fairly well and is asymptomatic. She's been noticed to have progression of hypermetabolic nodules in her right and left lung and recently CT-guided biopsy of left lower lobe lesion was positive for squamous cell carcinoma.does have a smaller lesion in the right upper lobe.treatment options have been discussed with medical oncology and she seen today for consideration of SB RT to the left lower lobe lesion.patient specifically denies cough hemoptysis or chest tightness.she is really having no B symptoms at this time or residual problems withlower urinary tract symptoms or diarrhea.  COMPLICATIONS OF TREATMENT: none  FOLLOW UP COMPLIANCE: keeps appointments   PHYSICAL EXAM:  BP 130/89   Pulse 82   Temp 97.9 F (36.6 C)   Resp 18   Wt 192 lb 10.9 oz (87.4 kg)   BMI 34.13 kg/m  Well-developed well-nourished patient in NAD. HEENT reveals PERLA, EOMI, discs not visualized.  Oral cavity is clear. No oral mucosal lesions are identified. Neck is clear without evidence of cervical or supraclavicular adenopathy. Lungs are clear to A&P. Cardiac examination is essentially unremarkable with regular rate and rhythm without murmur rub or thrill. Abdomen is benign with no organomegaly or masses noted. Motor sensory and DTR levels are equal and symmetric in the upper and lower extremities. Cranial nerves II through XII are grossly intact. Proprioception is intact. No peripheral  adenopathy or edema is identified. No motor or sensory levels are noted. Crude visual fields are within normal range.  RADIOLOGY RESULTS: serial PET/CT scans are reviewed  PLAN: at this time like to go ahead with SB RT treatments to her left lower lobe squamous cell carcinoma biopsy proven. Would plan on delivering 5000 cGy in 5 fractions using for D treatment planning. I personally ordered CT simulation for early next week. Once I have completed SB RT will give the patient short treatment break and would go ahead and treat her right upper lobe lesion again with SB RT treatment. Based on the hypermetabolic activity and review with radiology believe also this is a non-small cell lung cancer and would be amenable to treatment. Patient and her family through interpreter of sign language agree with my treatment plan.  I would like to take this opportunity to thank you for allowing me to participate in the care of your patient.Armstead Peaks., MD

## 2017-03-08 NOTE — Progress Notes (Signed)
  Oncology Nurse Navigator Documentation  Navigator Location: [facility] (03/07/17 1100)   )Navigator Encounter Type: Initial RadOnc (03/07/17 1100)                     Patient Visit Type: PCHEKB (03/07/17 1100) Treatment Phase: Pre-Tx/Tx Discussion (03/07/17 1100) Barriers/Navigation Needs: Coordination of Care (03/07/17 1100)   Interventions: Coordination of Care (03/07/17 1100)   Coordination of Care: Appts (03/07/17 1100)     met with patient during initial radiation consultation with Dr. Baruch Gouty. All questions answered at the time of visit. Interpreter present during visit. No further questions or needs. Family and patient have contact info and were instructed to call me with further needs or questions. Pt and family verbalized understanding.             Time Spent with Patient: 60 (03/07/17 1100)

## 2017-03-13 ENCOUNTER — Ambulatory Visit
Admission: RE | Admit: 2017-03-13 | Discharge: 2017-03-13 | Disposition: A | Discharge status: Home / Self Care | Payer: Medicare HMO | Source: Ambulatory Visit | Attending: Radiation Oncology | Admitting: Radiation Oncology

## 2017-03-13 DIAGNOSIS — Z510 Encounter for antineoplastic radiation therapy: Secondary | ICD-10-CM | POA: Diagnosis not present

## 2017-03-22 DIAGNOSIS — Z510 Encounter for antineoplastic radiation therapy: Secondary | ICD-10-CM | POA: Diagnosis not present

## 2017-03-26 ENCOUNTER — Ambulatory Visit
Admission: RE | Admit: 2017-03-26 | Discharge: 2017-03-26 | Disposition: A | Discharge status: Home / Self Care | Payer: Medicare HMO | Source: Ambulatory Visit | Attending: Radiation Oncology | Admitting: Radiation Oncology

## 2017-03-26 DIAGNOSIS — Z510 Encounter for antineoplastic radiation therapy: Secondary | ICD-10-CM | POA: Diagnosis not present

## 2017-03-28 ENCOUNTER — Ambulatory Visit
Admission: RE | Admit: 2017-03-28 | Discharge: 2017-03-28 | Disposition: A | Discharge status: Home / Self Care | Payer: Medicare HMO | Source: Ambulatory Visit | Attending: Radiation Oncology | Admitting: Radiation Oncology

## 2017-03-28 DIAGNOSIS — Z510 Encounter for antineoplastic radiation therapy: Secondary | ICD-10-CM | POA: Diagnosis not present

## 2017-04-03 ENCOUNTER — Ambulatory Visit
Admission: RE | Admit: 2017-04-03 | Discharge: 2017-04-03 | Disposition: A | Discharge status: Home / Self Care | Payer: Medicare HMO | Source: Ambulatory Visit | Attending: Radiation Oncology | Admitting: Radiation Oncology

## 2017-04-03 DIAGNOSIS — Z510 Encounter for antineoplastic radiation therapy: Secondary | ICD-10-CM | POA: Diagnosis not present

## 2017-04-04 ENCOUNTER — Ambulatory Visit: Payer: Medicare HMO

## 2017-04-05 ENCOUNTER — Inpatient Hospital Stay: Payer: Medicare HMO | Attending: Hematology and Oncology

## 2017-04-05 ENCOUNTER — Ambulatory Visit
Admission: RE | Admit: 2017-04-05 | Discharge: 2017-04-05 | Disposition: A | Discharge status: Home / Self Care | Payer: Medicare HMO | Source: Ambulatory Visit | Attending: Radiation Oncology | Admitting: Radiation Oncology

## 2017-04-05 DIAGNOSIS — R918 Other nonspecific abnormal finding of lung field: Secondary | ICD-10-CM | POA: Diagnosis not present

## 2017-04-05 DIAGNOSIS — I10 Essential (primary) hypertension: Secondary | ICD-10-CM | POA: Insufficient documentation

## 2017-04-05 DIAGNOSIS — M129 Arthropathy, unspecified: Secondary | ICD-10-CM | POA: Diagnosis not present

## 2017-04-05 DIAGNOSIS — D649 Anemia, unspecified: Secondary | ICD-10-CM | POA: Insufficient documentation

## 2017-04-05 DIAGNOSIS — I2510 Atherosclerotic heart disease of native coronary artery without angina pectoris: Secondary | ICD-10-CM | POA: Diagnosis not present

## 2017-04-05 DIAGNOSIS — Z7982 Long term (current) use of aspirin: Secondary | ICD-10-CM | POA: Diagnosis not present

## 2017-04-05 DIAGNOSIS — C3432 Malignant neoplasm of lower lobe, left bronchus or lung: Secondary | ICD-10-CM | POA: Insufficient documentation

## 2017-04-05 DIAGNOSIS — Z79899 Other long term (current) drug therapy: Secondary | ICD-10-CM | POA: Insufficient documentation

## 2017-04-05 DIAGNOSIS — Z510 Encounter for antineoplastic radiation therapy: Secondary | ICD-10-CM | POA: Diagnosis not present

## 2017-04-05 DIAGNOSIS — Z923 Personal history of irradiation: Secondary | ICD-10-CM | POA: Insufficient documentation

## 2017-04-05 DIAGNOSIS — Z8572 Personal history of non-Hodgkin lymphomas: Secondary | ICD-10-CM | POA: Diagnosis not present

## 2017-04-05 DIAGNOSIS — Z87891 Personal history of nicotine dependence: Secondary | ICD-10-CM | POA: Diagnosis not present

## 2017-04-05 DIAGNOSIS — Z95828 Presence of other vascular implants and grafts: Secondary | ICD-10-CM

## 2017-04-05 DIAGNOSIS — C8580 Other specified types of non-Hodgkin lymphoma, unspecified site: Secondary | ICD-10-CM

## 2017-04-05 DIAGNOSIS — R200 Anesthesia of skin: Secondary | ICD-10-CM | POA: Diagnosis not present

## 2017-04-05 DIAGNOSIS — C3492 Malignant neoplasm of unspecified part of left bronchus or lung: Secondary | ICD-10-CM

## 2017-04-05 DIAGNOSIS — C8330 Diffuse large B-cell lymphoma, unspecified site: Secondary | ICD-10-CM

## 2017-04-05 DIAGNOSIS — C8198 Hodgkin lymphoma, unspecified, lymph nodes of multiple sites: Secondary | ICD-10-CM

## 2017-04-05 LAB — BASIC METABOLIC PANEL
Anion gap: 8 (ref 5–15)
BUN: 32 mg/dL — ABNORMAL HIGH (ref 6–20)
CO2: 24 mmol/L (ref 22–32)
Calcium: 9.4 mg/dL (ref 8.9–10.3)
Chloride: 102 mmol/L (ref 101–111)
Creatinine, Ser: 1.2 mg/dL — ABNORMAL HIGH (ref 0.44–1.00)
GFR calc Af Amer: 51 mL/min — ABNORMAL LOW (ref >60)
GFR calc non Af Amer: 44 mL/min — ABNORMAL LOW (ref >60)
Glucose, Bld: 105 mg/dL — ABNORMAL HIGH (ref 65–99)
Potassium: 4 mmol/L (ref 3.5–5.1)
Sodium: 134 mmol/L — ABNORMAL LOW (ref 135–145)

## 2017-04-05 LAB — CBC WITH DIFFERENTIAL/PLATELET
Basophils Absolute: 0.1 10*3/uL (ref 0–0.1)
Basophils Relative: 1 %
Eosinophils Absolute: 0.3 10*3/uL (ref 0–0.7)
Eosinophils Relative: 4 %
HCT: 33.3 % — ABNORMAL LOW (ref 35.0–47.0)
Hemoglobin: 11.2 g/dL — ABNORMAL LOW (ref 12.0–16.0)
Lymphocytes Relative: 16 %
Lymphs Abs: 1.4 10*3/uL (ref 1.0–3.6)
MCH: 29.8 pg (ref 26.0–34.0)
MCHC: 33.7 g/dL (ref 32.0–36.0)
MCV: 88.5 fL (ref 80.0–100.0)
Monocytes Absolute: 1.2 10*3/uL — ABNORMAL HIGH (ref 0.2–0.9)
Monocytes Relative: 14 %
Neutro Abs: 5.5 10*3/uL (ref 1.4–6.5)
Neutrophils Relative %: 65 %
Platelets: 320 10*3/uL (ref 150–440)
RBC: 3.76 MIL/uL — ABNORMAL LOW (ref 3.80–5.20)
RDW: 17.4 % — ABNORMAL HIGH (ref 11.5–14.5)
WBC: 8.4 10*3/uL (ref 3.6–11.0)

## 2017-04-05 LAB — URIC ACID: Uric Acid, Serum: 4.2 mg/dL (ref 2.3–6.6)

## 2017-04-05 MED ORDER — HEPARIN SOD (PORK) LOCK FLUSH 100 UNIT/ML IV SOLN
500.0000 [IU] | Freq: Once | INTRAVENOUS | Status: AC
  Administered 2017-04-05: 500 [IU] via INTRAVENOUS

## 2017-04-05 MED ORDER — SODIUM CHLORIDE 0.9% FLUSH
10.0000 mL | INTRAVENOUS | Status: AC | PRN
  Administered 2017-04-05: 10 mL via INTRAVENOUS
  Filled 2017-04-05: qty 10

## 2017-04-06 ENCOUNTER — Inpatient Hospital Stay: Payer: Medicare HMO

## 2017-04-09 ENCOUNTER — Ambulatory Visit
Admission: RE | Admit: 2017-04-09 | Discharge: 2017-04-09 | Disposition: A | Discharge status: Home / Self Care | Payer: Medicare HMO | Source: Ambulatory Visit | Attending: Radiation Oncology | Admitting: Radiation Oncology

## 2017-04-09 DIAGNOSIS — Z510 Encounter for antineoplastic radiation therapy: Secondary | ICD-10-CM | POA: Diagnosis not present

## 2017-04-11 ENCOUNTER — Ambulatory Visit: Payer: Medicare HMO

## 2017-04-16 ENCOUNTER — Other Ambulatory Visit: Payer: Self-pay | Admitting: *Deleted

## 2017-04-16 ENCOUNTER — Inpatient Hospital Stay (HOSPITAL_BASED_OUTPATIENT_CLINIC_OR_DEPARTMENT_OTHER): Payer: Medicare HMO | Admitting: Hematology and Oncology

## 2017-04-16 ENCOUNTER — Encounter: Payer: Self-pay | Admitting: Hematology and Oncology

## 2017-04-16 VITALS — BP 140/82 | HR 93 | Temp 97.7°F | Resp 18 | Wt 194.7 lb

## 2017-04-16 DIAGNOSIS — C3432 Malignant neoplasm of lower lobe, left bronchus or lung: Secondary | ICD-10-CM

## 2017-04-16 DIAGNOSIS — M129 Arthropathy, unspecified: Secondary | ICD-10-CM

## 2017-04-16 DIAGNOSIS — R918 Other nonspecific abnormal finding of lung field: Secondary | ICD-10-CM | POA: Diagnosis not present

## 2017-04-16 DIAGNOSIS — Z79899 Other long term (current) drug therapy: Secondary | ICD-10-CM

## 2017-04-16 DIAGNOSIS — Z7982 Long term (current) use of aspirin: Secondary | ICD-10-CM

## 2017-04-16 DIAGNOSIS — C8580 Other specified types of non-Hodgkin lymphoma, unspecified site: Principal | ICD-10-CM

## 2017-04-16 DIAGNOSIS — I2510 Atherosclerotic heart disease of native coronary artery without angina pectoris: Secondary | ICD-10-CM | POA: Diagnosis not present

## 2017-04-16 DIAGNOSIS — Z8572 Personal history of non-Hodgkin lymphomas: Secondary | ICD-10-CM

## 2017-04-16 DIAGNOSIS — I10 Essential (primary) hypertension: Secondary | ICD-10-CM | POA: Diagnosis not present

## 2017-04-16 DIAGNOSIS — Z87891 Personal history of nicotine dependence: Secondary | ICD-10-CM | POA: Diagnosis not present

## 2017-04-16 DIAGNOSIS — C3492 Malignant neoplasm of unspecified part of left bronchus or lung: Secondary | ICD-10-CM

## 2017-04-16 DIAGNOSIS — C8198 Hodgkin lymphoma, unspecified, lymph nodes of multiple sites: Secondary | ICD-10-CM

## 2017-04-16 DIAGNOSIS — C8330 Diffuse large B-cell lymphoma, unspecified site: Secondary | ICD-10-CM

## 2017-04-16 DIAGNOSIS — Z923 Personal history of irradiation: Secondary | ICD-10-CM

## 2017-04-16 DIAGNOSIS — D649 Anemia, unspecified: Secondary | ICD-10-CM | POA: Diagnosis not present

## 2017-04-16 DIAGNOSIS — R200 Anesthesia of skin: Secondary | ICD-10-CM | POA: Diagnosis not present

## 2017-04-16 DIAGNOSIS — C8190 Hodgkin lymphoma, unspecified, unspecified site: Secondary | ICD-10-CM

## 2017-04-16 NOTE — Progress Notes (Signed)
.[facility] day:  04/16/2017   Chief Complaint: Toni Griffith is a 76 y.o. female with a gray zone lymphoma who is seen for assessment after completion of radiation.  HPI:  The patient was last seen in the medical oncology clinic on 02/23/2017.  At that time, she felt good.  She denied any B symptoms.  Exam was unremarkable. Scans revealed 2 enlarging pulmonary nodules.  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  She was referred to radiation oncology.  She underwent SBRT 03/26/2017 - 04/09/2017.  She received 5000 cGy in 5 fractions to the left lower lobe lesion.  Plan was for her to have a break then receive SBRT to the right upper lobe lesion. Patient planned to see Dr. Donella Stade on 05/18/2017 for a follow up visit to discuss further treatment.   Symptomatically, she is doing well. She denies fevers, sweats, and weight loss.  Her energy is "good". She reports intermittent difficulties with her breathing. This is mostly exertional. Patient eating well; weight up 4 pounds since last visit.    Past Medical History:  Diagnosis Date  . Arthritis   . Cancer [facility])    lymphoma-right hip  . Deaf   . Hypertension     Past Surgical History:  Procedure Laterality Date  . CARDIAC SURGERY  1993  . CORONARY ARTERY BYPASS GRAFT    . CYST REMOVAL HAND    . PERIPHERAL VASCULAR CATHETERIZATION N/A 04/06/2016   Procedure: Glori Luis Cath Insertion;  Surgeon: Algernon Huxley, MD;  Location: [facility];  Service: Cardiovascular;  Laterality: N/A;    Family History  Problem Relation Age of Onset  . Lung cancer Mother   . Lung cancer Father   . Diabetes Maternal Uncle   . Myasthenia gravis Maternal Grandmother   . Leukemia Maternal Grandfather     Social History:  reports that she quit smoking about 16 months ago. She has never used smokeless tobacco. She reports that she drinks alcohol. She reports that she does not use  drugs.  She smoked 1 1/2 packs per day for 20 years (30 pack year smoking history) until 10/2015.  She is hearing impaired (deaf).  She lives in [place] with her boyfriend.  Her daughter lives in [place]. The patient is accompanied by her boyfriend, son, daughter, and the interpreter (sign language) today.  Allergies: No Known Allergies  Current Medications: Current Outpatient Prescriptions  Medication Sig Dispense Refill  . acetaminophen (TYLENOL) 325 MG tablet Take 650 mg by mouth.    Marland Kitchen allopurinol (ZYLOPRIM) 300 MG tablet Take 1 tablet (300 mg total) by mouth daily. 30 tablet 5  . aspirin 325 MG tablet Take 325 mg by mouth daily.    . B Complex Vitamins (B COMPLEX 1 PO) Take by mouth.    . Cholecalciferol (VITAMIN D3) 2000 units capsule Take 2,000 Units by mouth daily.    Marland Kitchen KLOR-CON 10 10 MEQ tablet TAKE 1 TABLET (10 MEQ TOTAL) BY MOUTH 2 (TWO) TIMES DAILY. 60 tablet 0  . pantoprazole (PROTONIX) 40 MG tablet Take 40 mg by mouth.    . potassium chloride (KLOR-CON M10) 10 MEQ tablet TAKE 1 TABLET TWICE DAILY FOR 3 DAYS THEN ONCE DAILY    . vitamin B-12 (CYANOCOBALAMIN) 500 MCG tablet Take 500 mcg by mouth daily.    . vitamin E 400 UNIT capsule Take by mouth.    Marland Kitchen atorvastatin (LIPITOR) 80 MG  tablet Take 80 mg by mouth.    . gabapentin (NEURONTIN) 100 MG capsule     . lidocaine-prilocaine (EMLA) cream Apply cream 1 hour before chemotherapy treatment and place small peive of saran wrap over cream to protect clothing (Patient not taking: Reported on 04/16/2017) 30 g 1  . naproxen sodium (ANAPROX) 220 MG tablet Take 220 mg by mouth as needed.    . ondansetron (ZOFRAN ODT) 4 MG disintegrating tablet Take 1 tablet (4 mg total) by mouth every 8 (eight) hours as needed for nausea or vomiting. (Patient not taking: Reported on 08/07/2016) 20 tablet 0  . ondansetron (ZOFRAN) 8 MG tablet Take 1 tablet (8 mg total) by mouth 2 (two) times daily as needed for refractory nausea / vomiting. Start on day 3 after  cyclophosphamide chemotherapy. (Patient not taking: Reported on 08/07/2016) 30 tablet 1  . triamterene-hydrochlorothiazide (DYAZIDE) 37.5-25 MG capsule Take by mouth.     No current facility-administered medications for this visit.    Facility-Administered Medications Ordered in Other Visits  Medication Dose Route Frequency Provider Last Rate Last Dose  . sodium chloride flush (NS) 0.9 % injection 10 mL  10 mL Intravenous PRN Lequita Asal, MD   10 mL at 04/05/17 1034    Review of Systems:  GENERAL:  Feels good. No fever, chills or sweats.  Weight up 4 pounds. PERFORMANCE STATUS (ECOG):  2 HEENT:  Deaf.  No visual changes, runny nose, sore throat, mouth sores or tenderness. Lungs: Shortness of breath with exertion.  No cough.  No hemoptysis. Cardiac:  No chest pain, palpitations, orthopnea, or PND.  GI:  Eating well.  No nausea, vomiting, diarrhea, constipation, melena or hematochezia. GU:  No urgency, frequency, dysuria, or hematuria. Musculoskeletal:  Chronic knee problems.  No back pain.  No muscle tenderness. Extremities:  No pain or swelling. Skin:  No rashes or skin changes. Neuro:  Little numbness in fingertips. No headache, numbness or weakness, balance or coordination issues. Endocrine:  No diabetes, thyroid issues, hot flashes or night sweats. Psych:  No mood changes, depression or anxiety. Pain:  No focal pain. Review of systems:  All other systems reviewed and found to be negative.  Physical Exam:  Blood pressure 140/82, pulse 93, temperature 97.7 F (36.5 C), resp. rate 18, weight 194 lb 11.2 oz (88.3 kg). GENERAL:  Elderly woman sitting comfortably in the exam room in no acute distress. She needs assistance onto the table. MENTAL STATUS:  Alert and oriented to person, place and time. HEAD:  Pearline Cables hair.  Normocephalic, atraumatic, face symmetric, no Cushingoid features. EYES:  Hazel eyes.  Pupils equal round and reactive to light and accomodation.  No conjunctivitis  or scleral icterus. ENT:  Oropharynx clear without lesion.  Upper dentures.  Tongue normal. Mucous membranes moist.  RESPIRATORY:  Clear to auscultation without rales, wheezes or rhonchi. CARDIOVASCULAR:  Regular rate and rhythm without murmur, rub or gallop. ABDOMEN:  Ventral hernia.  Soft, non-tender, with active bowel sounds, and no appreciable hepatosplenomegaly.  No masses. SKIN:  No rashes, ulcers or lesions. EXTREMITIES: No edema, no skin discoloration or tenderness.  No palpable cords. LYMPH NODES: No palpable cervical, supraclavicular, axillary or inguinal adenopathy  NEUROLOGICAL:  Lower extremity strength and sensation symmetric.  No foot drop. PSYCH:  Appropriate.   No visits with results within 3 Day(s) from this visit.  Latest known visit with results is:  Infusion on 04/05/2017  Component Date Value Ref Range Status  . WBC 04/05/2017 8.4  3.6 - 11.0 K/uL Final  . RBC 04/05/2017 3.76* 3.80 - 5.20 MIL/uL Final  . Hemoglobin 04/05/2017 11.2* 12.0 - 16.0 g/dL Final  . HCT 04/05/2017 33.3* 35.0 - 47.0 % Final  . MCV 04/05/2017 88.5  80.0 - 100.0 fL Final  . MCH 04/05/2017 29.8  26.0 - 34.0 pg Final  . MCHC 04/05/2017 33.7  32.0 - 36.0 g/dL Final  . RDW 04/05/2017 17.4* 11.5 - 14.5 % Final  . Platelets 04/05/2017 320  150 - 440 K/uL Final  . Neutrophils Relative % 04/05/2017 65  % Final  . Neutro Abs 04/05/2017 5.5  1.4 - 6.5 K/uL Final  . Lymphocytes Relative 04/05/2017 16  % Final  . Lymphs Abs 04/05/2017 1.4  1.0 - 3.6 K/uL Final  . Monocytes Relative 04/05/2017 14  % Final  . Monocytes Absolute 04/05/2017 1.2* 0.2 - 0.9 K/uL Final  . Eosinophils Relative 04/05/2017 4  % Final  . Eosinophils Absolute 04/05/2017 0.3  0 - 0.7 K/uL Final  . Basophils Relative 04/05/2017 1  % Final  . Basophils Absolute 04/05/2017 0.1  0 - 0.1 K/uL Final  . Sodium 04/05/2017 134* 135 - 145 mmol/L Final  . Potassium 04/05/2017 4.0  3.5 - 5.1 mmol/L Final  . Chloride 04/05/2017 102  101 -  111 mmol/L Final  . CO2 04/05/2017 24  22 - 32 mmol/L Final  . Glucose, Bld 04/05/2017 105* 65 - 99 mg/dL Final  . BUN 04/05/2017 32* 6 - 20 mg/dL Final  . Creatinine, Ser 04/05/2017 1.20* 0.44 - 1.00 mg/dL Final  . Calcium 04/05/2017 9.4  8.9 - 10.3 mg/dL Final  . GFR calc non Af Amer 04/05/2017 44* >60 mL/min Final  . GFR calc Af Amer 04/05/2017 51* >60 mL/min Final   Comment: (NOTE) The eGFR has been calculated using the CKD EPI equation. This calculation has not been validated in all clinical situations. eGFR's persistently <60 mL/min signify possible Chronic Kidney Disease.   . Anion gap 04/05/2017 8  5 - 15 Final  . Uric Acid, Serum 04/05/2017 4.2  2.3 - 6.6 mg/dL Final    Radiology studies: 02/20/2016:  Chest, abdomen and pelvic CT revealed a 3.7 cm subpleural mass within the left lower lobe, a 1.1 cm right lower lobe subpleural nodule , 9 mm RUL nodule, and 0.4 cm lingular nodule.  Between the left second, third, and fourth ribs there was 1.9 cm soft tissue mass. There was a 2.5 cm low-attenuation nodule in the left lobe of the thyroid.   There were prominent left axillary nodes (up to 1.0 cm).   There was no definite mediastinal adenopathy. There was a 5.5 x 4.6 cm cystic lesion in the left hepatic lobe.  There was a 3.1 cm nodule in the right adrenal gland. There was a 1.2 cm right retrocaval lymph node with additional prominent subcentimeter retroperitoneal nodes. There was 1.0 cm left common iliac node. There was a 6.2 x 5.2 cm soft tissue mass within the right ilium.  02/21/2016:  Head MRI revealed no evidence of malignancy. Thyroid ultrasound on 02/21/2016 revealed a 2.4 cm solid nodule in the inferior aspect of the left lobe 03/03/2016:  PET scan revealed a hypermetabolic left lower lobe mass, bilateral pleural and pulmonary parenchymal nodular metastasis, a large pleural lesion invading the anterior left chest wall.   There was activity in the superior left ocular orbit.  There  was a large lesion centered in the right iliac wing with soft tissue extension.  There were hypermetabolic right external iliac lymph nodes 06/06/2016:  PET scan revealed significant interval response to therapy.  There was significant decrease in size and FDG uptake associated with left lower lobe lung lesion.  There was resolution of left-sided pleural base metastasis and left axillary and anterior mediastinal nodal metastasis.  There was resolution of previous hypermetabolic soft tissue metastasis/nodal metastasis posterior to the IVC.  There was marked improvement in hypermetabolic metastasis involving the right iliac wing.  There was persistent right upper lobe lung nodule which exhibits intense radiotracer uptake which may represent a focus of metastatic disease or synchronous primary lung neoplasm. 06/01/2017:  PET scan revealed no new or progressive findings.  The 9 mm RUL pulmonary nodule remained hypermetabolic (SUV 65.4, previously 7.3) and no change in size.  The index lesion in the posterior left lower lobe was minimally smaller on CT imaging with very low level FDG uptake (SUV 1.6).  There was no change in the appearance at site of previous disease at the right iliac wing (SUV 1.9 compared to 2.6 previously). 01/29/2017:  PET scan revealed interval progression of hypermetabolic nodules in the right upper (13 mm compared to 9 mm; SUV 10) and left lower lobes (26 mm compared to 22 mm; SUV 7.5).  There was new hypermetabolic focus of activity along the anterior left pleura although no underlying pleural or lung mass was evident.   Assessment:  Toni Griffith is a 76 y.o. female with stage IVB B-cell lymphoma, unclassifiable, with features intermediate between diffuse large B-cell lymphoma and classical Hodgkin's lymphoma ("gray zone" lymphoma).  She underwent right iliac wing bone/bone marrow biopsy on 03/08/2016.  Head MRI on 02/21/2016 revealed no evidence of malignancy. Thyroid ultrasound on  02/21/2016 revealed a 2.4 cm solid nodule in the inferior aspect of the left lobe  PET scan on 03/03/2016 revealed a hypermetabolic left lower lobe mass, bilateral pleural and pulmonary parenchymal nodular metastasis, a large pleural lesion invading the anterior left chest wall.   There was activity in the superior left ocular orbit.  There was a large lesion centered in the right iliac wing with soft tissue extension.  There were hypermetabolic right external iliac lymph nodes  She was [facility] to [facility] in [facility].  She had renal failure and hypercalcemia. She was treated with IVF, calcitonin and Zometa.The patient has a history of coronary artery disease s/p CABG in 1993.  She has no history of heart failure.  She is deaf and requires an interpreter.  Echo on 04/04/2016 revealed an EF of 55-60%.  Hepatitis B and C testing was negative on 03/30/2016.   G6PD assay was normal.  She declined LP with MTX prophylaxis.  She received 6 cycles of mini-RCHOP (04/07/2016 - 07/21/2016).  She has had some transient fingertip numbness.  She received radiation to the right iliac wing,  from 10/11/2016 - 11/01/2016.  She received 3000 cGy over 3 weeks.  PET scan on 01/29/2017 revealed interval progression of hypermetabolic nodules in the right upper (13 mm compared to 9 mm; SUV 10) and left lower lobes (26 mm compared to 22 mm; SUV 7.5).  There was new hypermetabolic focus of activity along the anterior left pleura although no underlying pleural or lung mass was evident.  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  She underwent SBRT 03/26/2017 - 04/09/2017.  She received 5000 cGy in 5 fractions to the left lower lobe lesion.  She has a normocytic anemia.  Labs on 04/17/2016 revealed an elevated ferritin (444), iron saturation (11%), low TIBC (240), B12 (2409), and folate (10.8).  Symptomatically, she feels good.  She denies any B symptoms.  Exam is  unremarkable.   Plan: 1.  Obtain radiation oncology summary. 2.  Anticipate follow up PET scan 3 months after completion of radiation treatments; tentatively this will be the first part of February 2019. 3.  Continue port flushes every 4-6 weeks. 4.  Discuss flu and pneumonia vaccinations. Patient's counts are stable. Ok to proceed with both injections at this time.   5.  Follow up with Dr. Baruch Gouty on 05/18/2017 as scheduled for continued SBRT treatments.  6.  Reviewed labs from 04/05/2017; results unremarkable.  7.  Discuss plan for likely treatment of RUL lung lesion with SBRT. 8.  RTC on 05/18/2017 for labs (BMP) - already seeing Dr. Baruch Gouty and getting port flushed this day.  9.  RTC in 3 months for MD assessment and labs (CBC with diff, CMP, LDH, uric acid).   Honor Loh, NP  04/16/2017, 3:45 PM   I saw and evaluated the patient, participating in the key portions of the service and reviewing pertinent diagnostic studies and records.  I reviewed the nurse practitioner's note and agree with the findings and the plan.  The assessment and plan were discussed with the patient.  Additional diagnostic studies of PET scan are needed to assess activity of RUL lung lesion and restage lymphoma.  Imaging would change the clinical management.  Multiple questions were asked by the patient and answered.   Lequita Asal, MD 04/16/2017, 3:45 PM

## 2017-04-16 NOTE — Progress Notes (Signed)
Here for follow up. Pt is deaf and [employer] loss [employer]

## 2017-04-25 ENCOUNTER — Telehealth: Payer: Self-pay | Admitting: *Deleted

## 2017-04-25 NOTE — Telephone Encounter (Signed)
[name] advised of doctor response

## 2017-04-25 NOTE — Telephone Encounter (Signed)
Spoke with [employer]. Creatinine 1.2. Ultimately it is up to the PCP, however there is no reason from a hematological/oncological standpoint that she should not be able to take this medication.

## 2017-04-25 NOTE — Telephone Encounter (Signed)
Patient having knee pain PCP wants to know if she can take Celebrex 200 mg daily. Please advise

## 2017-04-27 DIAGNOSIS — M1712 Unilateral primary osteoarthritis, left knee: Secondary | ICD-10-CM | POA: Insufficient documentation

## 2017-05-15 ENCOUNTER — Telehealth: Payer: Self-pay | Admitting: *Deleted

## 2017-05-15 NOTE — Telephone Encounter (Signed)
tty  To report that patient is complaining of tingling in her hand over the past several days and is asking if it could be form the chemotherapy she had almost a year ago, when I told her most likely not, she asked if radiation therapy could cause it. We spoke with Toni Griffith who said not typically and suggested she call her PCP or take her to ER if her face is drooping or other symptoms are present. She said she will try to call her mother tonight on Face Time and see if there are other symptoms. I suggested she do it now and she states she will try to call her now.

## 2017-05-15 NOTE — Telephone Encounter (Signed)
  I agree.  Symptoms not due to chemotherapy or radiation.  She needs to see and or call her PCP.  She may have carpal tunnel or something more serious.  Does she have any other neurologic symptom?  M

## 2017-05-18 ENCOUNTER — Ambulatory Visit
Admission: RE | Admit: 2017-05-18 | Discharge: 2017-05-18 | Disposition: A | Discharge status: Home / Self Care | Payer: Medicare HMO | Source: Ambulatory Visit | Attending: Radiation Oncology | Admitting: Radiation Oncology

## 2017-05-18 ENCOUNTER — Encounter: Payer: Self-pay | Admitting: Radiation Oncology

## 2017-05-18 ENCOUNTER — Telehealth: Payer: Self-pay | Admitting: *Deleted

## 2017-05-18 ENCOUNTER — Inpatient Hospital Stay: Payer: Medicare HMO | Attending: Hematology and Oncology

## 2017-05-18 VITALS — BP 160/90 | HR 85 | Temp 97.2°F | Resp 20 | Wt 190.4 lb

## 2017-05-18 DIAGNOSIS — M129 Arthropathy, unspecified: Secondary | ICD-10-CM | POA: Insufficient documentation

## 2017-05-18 DIAGNOSIS — C7801 Secondary malignant neoplasm of right lung: Secondary | ICD-10-CM | POA: Insufficient documentation

## 2017-05-18 DIAGNOSIS — C8190 Hodgkin lymphoma, unspecified, unspecified site: Secondary | ICD-10-CM

## 2017-05-18 DIAGNOSIS — Z923 Personal history of irradiation: Secondary | ICD-10-CM | POA: Insufficient documentation

## 2017-05-18 DIAGNOSIS — Z8572 Personal history of non-Hodgkin lymphomas: Secondary | ICD-10-CM | POA: Diagnosis not present

## 2017-05-18 DIAGNOSIS — C3432 Malignant neoplasm of lower lobe, left bronchus or lung: Secondary | ICD-10-CM | POA: Diagnosis not present

## 2017-05-18 DIAGNOSIS — C3492 Malignant neoplasm of unspecified part of left bronchus or lung: Secondary | ICD-10-CM

## 2017-05-18 DIAGNOSIS — R200 Anesthesia of skin: Secondary | ICD-10-CM | POA: Diagnosis not present

## 2017-05-18 DIAGNOSIS — Z87891 Personal history of nicotine dependence: Secondary | ICD-10-CM | POA: Insufficient documentation

## 2017-05-18 DIAGNOSIS — Z510 Encounter for antineoplastic radiation therapy: Secondary | ICD-10-CM | POA: Diagnosis not present

## 2017-05-18 DIAGNOSIS — C8338 Diffuse large B-cell lymphoma, lymph nodes of multiple sites: Secondary | ICD-10-CM | POA: Diagnosis present

## 2017-05-18 DIAGNOSIS — Z95828 Presence of other vascular implants and grafts: Secondary | ICD-10-CM

## 2017-05-18 LAB — BASIC METABOLIC PANEL
Anion gap: 10 (ref 5–15)
BUN: 23 mg/dL — ABNORMAL HIGH (ref 6–20)
CO2: 23 mmol/L (ref 22–32)
Calcium: 8.9 mg/dL (ref 8.9–10.3)
Chloride: 97 mmol/L — ABNORMAL LOW (ref 101–111)
Creatinine, Ser: 0.94 mg/dL (ref 0.44–1.00)
GFR calc Af Amer: >60 mL/min (ref >60)
GFR calc non Af Amer: 58 mL/min — ABNORMAL LOW (ref >60)
Glucose, Bld: 111 mg/dL — ABNORMAL HIGH (ref 65–99)
Potassium: 3.9 mmol/L (ref 3.5–5.1)
Sodium: 130 mmol/L — ABNORMAL LOW (ref 135–145)

## 2017-05-18 MED ORDER — SODIUM CHLORIDE 0.9% FLUSH
10.0000 mL | INTRAVENOUS | Status: AC | PRN
  Administered 2017-05-18: 10 mL
  Filled 2017-05-18: qty 10

## 2017-05-18 MED ORDER — HEPARIN SOD (PORK) LOCK FLUSH 100 UNIT/ML IV SOLN
500.0000 [IU] | INTRAVENOUS | Status: AC | PRN
  Administered 2017-05-18: 500 [IU]

## 2017-05-18 NOTE — Progress Notes (Signed)
Radiation Oncology Follow up Note  Name: Toni Griffith   Date:   05/18/2017 MRN:  [id number] DOB: [birth year]    This 76 y.o. female presents to the clinic today for one-month follow-up status post SB RT to her left lower lobe for squamous cell carcinoma.  REFERRING PROVIDER: Dionne Bucy., MD  HPI: Patient is a deaf 76 year old female previously treated for diffuse B-cell lymphoma with a large right iliac wing lesion. She is now 1 month out SB RT to her left lower lobe for squamous cell carcinoma. She is doing well she specifically denies cough dysphasia cranial or side effects. She's having some numbness in her fingers may be related to her recent medication for arthritis. She also has a lesion in her right upper lobe which we are considering today for again SB RT.Marland Kitchen  COMPLICATIONS OF TREATMENT: none  FOLLOW UP COMPLIANCE: keeps appointments   PHYSICAL EXAM:  BP (!) 160/90   Pulse 85   Temp (!) 97.2 F (36.2 C)   Resp 20   Wt 190 lb 5.9 oz (86.4 kg)   BMI 33.72 kg/m  Well-developed well-nourished patient in NAD. HEENT reveals PERLA, EOMI, discs not visualized.  Oral cavity is clear. No oral mucosal lesions are identified. Neck is clear without evidence of cervical or supraclavicular adenopathy. Lungs are clear to A&P. Cardiac examination is essentially unremarkable with regular rate and rhythm without murmur rub or thrill. Abdomen is benign with no organomegaly or masses noted. Motor sensory and DTR levels are equal and symmetric in the upper and lower extremities. Cranial nerves II through XII are grossly intact. Proprioception is intact. No peripheral adenopathy or edema is identified. No motor or sensory levels are noted. Crude visual fields are within normal range.  RADIOLOGY RESULTS: PET CT scan is reviewed  PLAN: At this time like to go ahead with SB RT to her right upper lobe lesion. Wheeze for the motion study and I have personally ordered and scheduled CT simulation for next  week. I've again through an interpreter gone over the risks and benefits of SB RT. She is done extremely well with no side effects from prior treatment. We will proceed with second round of SB RT as described above.  I would like to take this opportunity to thank you for allowing me to participate in the care of your patient.Armstead Peaks., MD

## 2017-05-18 NOTE — Telephone Encounter (Signed)
-----   Message from Lequita Asal, MD sent at 05/18/2017 12:10 PM EDT ----- Regarding: Please call patient and send BMP to PCP  Creatinine improved.  Potassium stable.  Sodium low (130).  M  ----- Message ----- From: Interface, Lab In [place] Sent: 05/18/2017  11:48 AM To: Lequita Asal, MD

## 2017-05-18 NOTE — Telephone Encounter (Signed)
Contacted patient - spoke with daughter [name]. Lab Results provided. Will fwd labs to pcp.

## 2017-05-23 ENCOUNTER — Ambulatory Visit
Admission: RE | Admit: 2017-05-23 | Discharge: 2017-05-23 | Disposition: A | Discharge status: Home / Self Care | Payer: Medicare HMO | Source: Ambulatory Visit | Attending: Radiation Oncology | Admitting: Radiation Oncology

## 2017-05-23 DIAGNOSIS — Z510 Encounter for antineoplastic radiation therapy: Secondary | ICD-10-CM | POA: Diagnosis not present

## 2017-06-01 DIAGNOSIS — Z510 Encounter for antineoplastic radiation therapy: Secondary | ICD-10-CM | POA: Diagnosis not present

## 2017-06-03 ENCOUNTER — Other Ambulatory Visit: Payer: Self-pay | Admitting: Hematology and Oncology

## 2017-06-03 DIAGNOSIS — E876 Hypokalemia: Secondary | ICD-10-CM

## 2017-06-03 DIAGNOSIS — R911 Solitary pulmonary nodule: Secondary | ICD-10-CM

## 2017-06-03 DIAGNOSIS — C8580 Other specified types of non-Hodgkin lymphoma, unspecified site: Principal | ICD-10-CM

## 2017-06-03 DIAGNOSIS — C8330 Diffuse large B-cell lymphoma, unspecified site: Secondary | ICD-10-CM

## 2017-06-03 DIAGNOSIS — C7951 Secondary malignant neoplasm of bone: Secondary | ICD-10-CM

## 2017-06-03 DIAGNOSIS — C8198 Hodgkin lymphoma, unspecified, lymph nodes of multiple sites: Secondary | ICD-10-CM

## 2017-06-04 ENCOUNTER — Ambulatory Visit
Admission: RE | Admit: 2017-06-04 | Discharge: 2017-06-04 | Disposition: A | Discharge status: Home / Self Care | Payer: Medicare HMO | Source: Ambulatory Visit | Attending: Radiation Oncology | Admitting: Radiation Oncology

## 2017-06-04 DIAGNOSIS — Z510 Encounter for antineoplastic radiation therapy: Secondary | ICD-10-CM | POA: Diagnosis not present

## 2017-06-06 ENCOUNTER — Ambulatory Visit
Admission: RE | Admit: 2017-06-06 | Discharge: 2017-06-06 | Disposition: A | Discharge status: Home / Self Care | Payer: Medicare HMO | Source: Ambulatory Visit | Attending: Radiation Oncology | Admitting: Radiation Oncology

## 2017-06-06 DIAGNOSIS — Z510 Encounter for antineoplastic radiation therapy: Secondary | ICD-10-CM | POA: Diagnosis not present

## 2017-06-11 ENCOUNTER — Ambulatory Visit
Admission: RE | Admit: 2017-06-11 | Discharge: 2017-06-11 | Disposition: A | Discharge status: Home / Self Care | Payer: Medicare HMO | Source: Ambulatory Visit | Attending: Radiation Oncology | Admitting: Radiation Oncology

## 2017-06-11 DIAGNOSIS — Z510 Encounter for antineoplastic radiation therapy: Secondary | ICD-10-CM | POA: Diagnosis not present

## 2017-06-13 ENCOUNTER — Other Ambulatory Visit: Payer: Self-pay | Admitting: *Deleted

## 2017-06-13 ENCOUNTER — Ambulatory Visit
Admission: RE | Admit: 2017-06-13 | Discharge: 2017-06-13 | Disposition: A | Discharge status: Home / Self Care | Payer: Medicare HMO | Source: Ambulatory Visit | Attending: Radiation Oncology | Admitting: Radiation Oncology

## 2017-06-13 DIAGNOSIS — Z510 Encounter for antineoplastic radiation therapy: Secondary | ICD-10-CM | POA: Diagnosis not present

## 2017-06-13 MED ORDER — PROMETHAZINE HCL 25 MG PO TABS: 25.0000 mg | ORAL_TABLET | Freq: Four times a day (QID) | ORAL | 0 refills | Status: DC | PRN

## 2017-06-18 ENCOUNTER — Ambulatory Visit
Admission: RE | Admit: 2017-06-18 | Discharge: 2017-06-18 | Disposition: A | Discharge status: Home / Self Care | Payer: Medicare HMO | Source: Ambulatory Visit | Attending: Radiation Oncology | Admitting: Radiation Oncology

## 2017-06-18 DIAGNOSIS — Z510 Encounter for antineoplastic radiation therapy: Secondary | ICD-10-CM | POA: Diagnosis not present

## 2017-06-27 ENCOUNTER — Telehealth: Payer: Self-pay | Admitting: Hematology and Oncology

## 2017-06-27 NOTE — Telephone Encounter (Signed)
Port Flushes every 6 to 8 weeks scheduled, per Rosa/schd msg/ via [place]. Updated appts mailed to patient.   Left msg on patient's vm with updated appointments. A copy of the updated appointment schedule also mailed to patient.

## 2017-07-18 NOTE — Progress Notes (Signed)
.Toni Griffith day:  07/19/2017   Chief Complaint: Toni Griffith is a 76 y.o. female with a Toni Griffith zone lymphoma who is seen for a 3 month assessment after completion of radiation.  HPI:  The patient was last seen in the medical oncology Griffith on 04/16/2017.  At that time, patient was doing well. She denies any B symptoms or recurrent infections. Her energy had improved. She notes some intermittent difficulties with her breathing. Eating well; weight up 4 pounds. Labs from 04/05/2017 reviewed - WBC 8.4 with an [place] of 5500. Hemoglobin 11.2, hematocrit 33.3, and platelets 320,000.   Patient underwent SBRT 03/26/2017 - 04/09/2017.  She received 5000 cGy in 5 fractions to the LEFT lower lobe lesion.  Patient returned on 05/23/2017 for CT simulation related to her RIGHT upper lobe lesion. She underwent SBRT in 5 fractions from 06/04/2017 - 06/18/2017.  Symptomatically, patient doing well overall. 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 She complains of bilateral knee pain that adversely affects her quality of sleep. Patient continues to experience exertional shortness of breath, however states that is no worse than it has been in the past. Patient denies any B symptoms. There have been no interval infections. Patient continues to eat well. She denies nausea, vomiting, and changes in her bowel habits. Weight remains relatively stable; down 2 pounds since last Griffith visit.  Patient reports pain 7/10 in the Griffith today. She notes that she did not  Take all of her morning medications prior to her visit. Blood pressure slightly elevated at 156/92.  Past Medical History:  Diagnosis Date  . Arthritis   . Cancer [facility])    lymphoma-right hip  . Deaf   . Hypertension     Past Surgical History:  Procedure Laterality Date  . CARDIAC SURGERY  1993  . CORONARY ARTERY BYPASS GRAFT    . CYST REMOVAL HAND    . PERIPHERAL VASCULAR CATHETERIZATION N/A  04/06/2016   Procedure: Toni Griffith Cath Insertion;  Surgeon: Algernon Huxley, MD;  Location: [facility];  Service: Cardiovascular;  Laterality: N/A;    Family History  Problem Relation Age of Onset  . Lung cancer Mother   . Lung cancer Father   . Diabetes Maternal Uncle   . Myasthenia gravis Maternal Grandmother   . Leukemia Maternal Grandfather     Social History:  reports that she quit smoking about 19 months ago. she has never used smokeless tobacco. She reports that she drinks alcohol. She reports that she does not use drugs.  She smoked 1 1/2 packs per day for 20 years (30 pack year smoking history) until 10/2015.  She is hearing impaired (deaf).  She lives in [place] with her boyfriend.  Her daughter lives in [place]. The patient is accompanied by her boyfriend, son, daughter, and the interpreter (sign language) today.  Allergies: No Known Allergies  Current Medications: Current Outpatient Medications  Medication Sig Dispense Refill  . acetaminophen (TYLENOL) 325 MG tablet Take 650 mg by mouth.    Marland Kitchen allopurinol (ZYLOPRIM) 300 MG tablet TAKE 1 TABLET BY MOUTH EVERY DAY 30 tablet 5  . aspirin 325 MG tablet Take 325 mg by mouth daily.    Marland Kitchen atorvastatin (LIPITOR) 80 MG tablet Take 80 mg by mouth.    . B Complex Vitamins (B COMPLEX 1 PO) Take by mouth.    Marland Kitchen FLUoxetine (PROZAC) 40 MG capsule     . gabapentin (NEURONTIN) 100  MG capsule     . lidocaine-prilocaine (EMLA) cream Apply cream 1 hour before chemotherapy treatment and place small peive of saran wrap over cream to protect clothing 30 g 1  . naproxen sodium (ANAPROX) 220 MG tablet Take 220 mg by mouth as needed.    . pantoprazole (PROTONIX) 40 MG tablet Take 40 mg by mouth.    . promethazine (PHENERGAN) 25 MG tablet Take 1 tablet (25 mg total) every 6 (six) hours as needed by mouth for nausea or vomiting. 10 tablet 0  . triamterene-hydrochlorothiazide (DYAZIDE) 37.5-25 MG capsule Take by mouth.    . vitamin B-12  (CYANOCOBALAMIN) 500 MCG tablet Take 500 mcg by mouth daily.    . vitamin E 400 UNIT capsule Take by mouth.    . celecoxib (CELEBREX) 200 MG capsule     . Cholecalciferol (VITAMIN D3) 2000 units capsule Take 2,000 Units by mouth daily.    Marland Kitchen KLOR-CON 10 10 MEQ tablet TAKE 1 TABLET (10 MEQ TOTAL) BY MOUTH 2 (TWO) TIMES DAILY. (Patient not taking: Reported on 07/19/2017) 60 tablet 0  . ondansetron (ZOFRAN ODT) 4 MG disintegrating tablet Take 1 tablet (4 mg total) by mouth every 8 (eight) hours as needed for nausea or vomiting. (Patient not taking: Reported on 07/19/2017) 20 tablet 0  . ondansetron (ZOFRAN) 8 MG tablet Take 1 tablet (8 mg total) by mouth 2 (two) times daily as needed for refractory nausea / vomiting. Start on day 3 after cyclophosphamide chemotherapy. (Patient not taking: Reported on 08/07/2016) 30 tablet 1   No current facility-administered medications for this visit.    Facility-Administered Medications Ordered in Other Visits  Medication Dose Route Frequency Provider Last Rate Last Dose  . sodium chloride flush (NS) 0.9 % injection 10 mL  10 mL Intravenous PRN Lequita Asal, MD   10 mL at 04/05/17 1034    Review of Systems:  GENERAL:  Feels good. No fever, chills or sweats.  Weight down 2 pounds.  PERFORMANCE STATUS (ECOG):  2 HEENT:  Deaf.  No visual changes, runny nose, sore throat, mouth sores or tenderness. Lungs: Shortness of breath with exertion.  No cough.  No hemoptysis. Cardiac:  No chest pain, palpitations, orthopnea, or PND.  GI:  Eating well.  No nausea, vomiting, diarrhea, constipation, melena or hematochezia. GU:  No urgency, frequency, dysuria, or hematuria. Musculoskeletal:  Chronic knee problems.  No back pain.  No muscle tenderness. Extremities:  No pain or swelling. Skin:  No rashes or skin changes. Neuro:  Little numbness in fingertips. No headache, numbness or weakness, balance or coordination issues. Endocrine:  No diabetes, thyroid issues, hot  flashes or night sweats. Psych:  No mood changes, depression or anxiety. Pain:  7/10 - knees Review of systems:  All other systems reviewed and found to be negative.  Physical Exam:  Blood pressure (!) 156/92, pulse 81, temperature (!) 96.2 F (35.7 C), temperature source Tympanic, resp. rate 20, weight 192 lb 8 oz (87.3 kg). GENERAL:  Elderly woman sitting comfortably in the exam room in no acute distress. She needs assistance onto the table. MENTAL STATUS:  Alert and oriented to person, place and time. HEAD:  Pearline Cables hair.  Normocephalic, atraumatic, face symmetric, no Cushingoid features. EYES:  Hazel eyes.  Pupils equal round and reactive to light and accomodation.  No conjunctivitis or scleral icterus. ENT:  Oropharynx clear without lesion.  Upper dentures.  Tongue normal. Mucous membranes moist.  RESPIRATORY:  Clear to auscultation without rales, wheezes or  rhonchi. CARDIOVASCULAR:  Regular rate and rhythm without murmur, rub or gallop. ABDOMEN:  Ventral hernia.  Soft, non-tender, with active bowel sounds, and no appreciable hepatosplenomegaly.  No masses. SKIN:  No rashes, ulcers or lesions. EXTREMITIES: No edema, no skin discoloration or tenderness.  No palpable cords. LYMPH NODES: No palpable cervical, supraclavicular, axillary or inguinal adenopathy  NEUROLOGICAL:  Lower extremity strength and sensation symmetric.  No foot drop. PSYCH:  Appropriate.   Infusion on 07/19/2017  Component Date Value Ref Range Status  . LDH 07/19/2017 128  98 - 192 U/L Final  . Sodium 07/19/2017 133* 135 - 145 mmol/L Final  . Potassium 07/19/2017 4.0  3.5 - 5.1 mmol/L Final  . Chloride 07/19/2017 102  101 - 111 mmol/L Final  . CO2 07/19/2017 23  22 - 32 mmol/L Final  . Glucose, Bld 07/19/2017 99  65 - 99 mg/dL Final  . BUN 07/19/2017 29* 6 - 20 mg/dL Final  . Creatinine, Ser 07/19/2017 1.19* 0.44 - 1.00 mg/dL Final  . Calcium 07/19/2017 9.0  8.9 - 10.3 mg/dL Final  . Total Protein 07/19/2017 6.8   6.5 - 8.1 g/dL Final  . Albumin 07/19/2017 3.5  3.5 - 5.0 g/dL Final  . AST 07/19/2017 24  15 - 41 U/L Final  . ALT 07/19/2017 17  14 - 54 U/L Final  . Alkaline Phosphatase 07/19/2017 78  38 - 126 U/L Final  . Total Bilirubin 07/19/2017 1.0  0.3 - 1.2 mg/dL Final  . GFR calc non Af Amer 07/19/2017 44* >60 mL/min Final  . GFR calc Af Amer 07/19/2017 51* >60 mL/min Final   Comment: (NOTE) The eGFR has been calculated using the CKD EPI equation. This calculation has not been validated in all clinical situations. eGFR's persistently <60 mL/min signify possible Chronic Kidney Disease.   . Anion gap 07/19/2017 8  5 - 15 Final  . WBC 07/19/2017 8.7  3.6 - 11.0 K/uL Final  . RBC 07/19/2017 3.72* 3.80 - 5.20 MIL/uL Final  . Hemoglobin 07/19/2017 11.0* 12.0 - 16.0 g/dL Final  . HCT 07/19/2017 33.3* 35.0 - 47.0 % Final  . MCV 07/19/2017 89.7  80.0 - 100.0 fL Final  . MCH 07/19/2017 29.6  26.0 - 34.0 pg Final  . MCHC 07/19/2017 33.0  32.0 - 36.0 g/dL Final  . RDW 07/19/2017 19.2* 11.5 - 14.5 % Final  . Platelets 07/19/2017 338  150 - 440 K/uL Final  . Neutrophils Relative % 07/19/2017 66  % Final  . Neutro Abs 07/19/2017 5.7  1.4 - 6.5 K/uL Final  . Lymphocytes Relative 07/19/2017 16  % Final  . Lymphs Abs 07/19/2017 1.4  1.0 - 3.6 K/uL Final  . Monocytes Relative 07/19/2017 13  % Final  . Monocytes Absolute 07/19/2017 1.1* 0.2 - 0.9 K/uL Final  . Eosinophils Relative 07/19/2017 4  % Final  . Eosinophils Absolute 07/19/2017 0.4  0 - 0.7 K/uL Final  . Basophils Relative 07/19/2017 1  % Final  . Basophils Absolute 07/19/2017 0.1  0 - 0.1 K/uL Final    Radiology studies: 02/20/2016:  Chest, abdomen and pelvic CT revealed a 3.7 cm subpleural mass within the left lower lobe, a 1.1 cm right lower lobe subpleural nodule , 9 mm RUL nodule, and 0.4 cm lingular nodule.  Between the left second, third, and fourth ribs there was 1.9 cm soft tissue mass. There was a 2.5 cm low-attenuation nodule in the  left lobe of the thyroid.   There were prominent  left axillary nodes (up to 1.0 cm).   There was no definite mediastinal adenopathy. There was a 5.5 x 4.6 cm cystic lesion in the left hepatic lobe.  There was a 3.1 cm nodule in the right adrenal gland. There was a 1.2 cm right retrocaval lymph node with additional prominent subcentimeter retroperitoneal nodes. There was 1.0 cm left common iliac node. There was a 6.2 x 5.2 cm soft tissue mass within the right ilium.  02/21/2016:  Head MRI revealed no evidence of malignancy. Thyroid ultrasound on 02/21/2016 revealed a 2.4 cm solid nodule in the inferior aspect of the left lobe 03/03/2016:  PET scan revealed a hypermetabolic left lower lobe mass, bilateral pleural and pulmonary parenchymal nodular metastasis, a large pleural lesion invading the anterior left chest wall.   There was activity in the superior left ocular orbit.  There was a large lesion centered in the right iliac wing with soft tissue extension.  There were hypermetabolic right external iliac lymph nodes 06/06/2016:  PET scan revealed significant interval response to therapy.  There was significant decrease in size and FDG uptake associated with left lower lobe lung lesion.  There was resolution of left-sided pleural base metastasis and left axillary and anterior mediastinal nodal metastasis.  There was resolution of previous hypermetabolic soft tissue metastasis/nodal metastasis posterior to the IVC.  There was marked improvement in hypermetabolic metastasis involving the right iliac wing.  There was persistent right upper lobe lung nodule which exhibits intense radiotracer uptake which may represent a focus of metastatic disease or synchronous primary lung neoplasm. 06/01/2017:  PET scan revealed no new or progressive findings.  The 9 mm RUL pulmonary nodule remained hypermetabolic (SUV 53.6, previously 7.3) and no change in size.  The index lesion in the posterior left lower lobe was minimally  smaller on CT imaging with very low level FDG uptake (SUV 1.6).  There was no change in the appearance at site of previous disease at the right iliac wing (SUV 1.9 compared to 2.6 previously). 01/29/2017:  PET scan revealed interval progression of hypermetabolic nodules in the right upper (13 mm compared to 9 mm; SUV 10) and left lower lobes (26 mm compared to 22 mm; SUV 7.5).  There was new hypermetabolic focus of activity along the anterior left pleura although no underlying pleural or lung mass was evident.  Assessment:  Toni Griffith is a 76 y.o. 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  She underwent right iliac wing bone/bone marrow biopsy on 03/08/2016.  Head MRI on 02/21/2016 revealed no evidence of malignancy. 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   There was activity in the superior left ocular orbit.  There was a large lesion centered in the right iliac wing with soft tissue extension.  There were hypermetabolic right external iliac lymph nodes  She was admitted to [facility] in [facility].  She had renal failure and hypercalcemia. She was treated with IVF, calcitonin and Zometa.The patient has a history of coronary artery disease s/p CABG in 1993.  She has no history of heart failure.  She is deaf and requires an interpreter.  Echo on 04/04/2016 revealed an EF of 55-60%.  Hepatitis B and C testing was negative on 03/30/2016.   G6PD assay was normal.  She declined LP with MTX prophylaxis.  She received 6 cycles of mini-RCHOP (04/07/2016 - 07/21/2016).  She has had some transient fingertip  numbness.  She received radiation to the right iliac wing,  from 10/11/2016 - 11/01/2016.  She received 3000 cGy over 3 weeks.  PET scan on 01/29/2017 revealed interval progression of hypermetabolic nodules in the right upper (13 mm compared to 9 mm; SUV 10) and left lower lobes (26 mm compared to 22 mm; SUV 7.5).  There was new hypermetabolic focus of activity along the anterior left pleura although no underlying pleural or lung mass was evident.  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  She underwent SBRT 03/26/2017 - 04/09/2017.  She received 5000 cGy in 5 fractions to the left lower lobe lesion. She underwent SBRT in 5 fractions from 06/04/2017 - 06/18/2017.  She has a normocytic anemia.  Labs on 04/17/2016 revealed an elevated ferritin (444), iron saturation (11%), low TIBC (240), B12 (2409), and folate (10.8).  Symptomatically, patient feels "good" for the most part. She notes increasing neuropathy despite daily Gabapentin 100 mg. He denies any B symptoms or interval infections. Exam is unremarkable. she feels good.  She denies any B symptoms.  Exam is unremarkable. WBC 8.7 with an [place] of 5700. Hemoglobin 11.0, hematocrit 33.3, and platelets 338,000. Renal function stable; BUN 29 with a creatinine of 1.19 (CrCl 43.5). Sodium low at 133. Potassium normal at 4.0. LDH and uric acid are both normal.  Plan: 1.  Labs today: CBC with diff, CMP, LDH, uric acid   2.  Obtain radiation oncology summary. 3.  Schedule post treatment PET scan on 09/18/2017. 4.  Continue port flushes every 4-6 weeks. 5.  Discuss increasing neuropathy in hands. Will increase Gabapentin BID. New Rx sent for Gabapentin 100 mg BID (Disp #60 with 3 refills) 6.  Discuss arthritic knee pain. Pain controlled with APAP. Encouraged patient to take dose at bedtime to help with pain that is preventing sleep.  7.  Follow up with radiation oncology as already scheduled.  8.  RTC on  09/20/2017 for MD assessment, labs (CBC with diff, CMP, LDH, uric acid), and to review PET scan   Honor Loh, NP  07/19/2017, 11:47 AM

## 2017-07-19 ENCOUNTER — Inpatient Hospital Stay: Payer: Medicare HMO | Attending: Hematology and Oncology

## 2017-07-19 ENCOUNTER — Inpatient Hospital Stay: Payer: Medicare HMO | Admitting: Urgent Care

## 2017-07-19 VITALS — BP 156/92 | HR 81 | Temp 96.2°F | Resp 20 | Wt 192.5 lb

## 2017-07-19 DIAGNOSIS — I2510 Atherosclerotic heart disease of native coronary artery without angina pectoris: Secondary | ICD-10-CM

## 2017-07-19 DIAGNOSIS — M25561 Pain in right knee: Secondary | ICD-10-CM | POA: Insufficient documentation

## 2017-07-19 DIAGNOSIS — M25562 Pain in left knee: Secondary | ICD-10-CM

## 2017-07-19 DIAGNOSIS — Z923 Personal history of irradiation: Secondary | ICD-10-CM | POA: Insufficient documentation

## 2017-07-19 DIAGNOSIS — M129 Arthropathy, unspecified: Secondary | ICD-10-CM | POA: Diagnosis not present

## 2017-07-19 DIAGNOSIS — Z7982 Long term (current) use of aspirin: Secondary | ICD-10-CM | POA: Diagnosis not present

## 2017-07-19 DIAGNOSIS — I10 Essential (primary) hypertension: Secondary | ICD-10-CM | POA: Diagnosis not present

## 2017-07-19 DIAGNOSIS — Z87891 Personal history of nicotine dependence: Secondary | ICD-10-CM

## 2017-07-19 DIAGNOSIS — G629 Polyneuropathy, unspecified: Secondary | ICD-10-CM | POA: Insufficient documentation

## 2017-07-19 DIAGNOSIS — Z79899 Other long term (current) drug therapy: Secondary | ICD-10-CM

## 2017-07-19 DIAGNOSIS — R0602 Shortness of breath: Secondary | ICD-10-CM | POA: Insufficient documentation

## 2017-07-19 DIAGNOSIS — C8190 Hodgkin lymphoma, unspecified, unspecified site: Secondary | ICD-10-CM

## 2017-07-19 DIAGNOSIS — M159 Polyosteoarthritis, unspecified: Secondary | ICD-10-CM

## 2017-07-19 DIAGNOSIS — E871 Hypo-osmolality and hyponatremia: Secondary | ICD-10-CM

## 2017-07-19 DIAGNOSIS — T451X5A Adverse effect of antineoplastic and immunosuppressive drugs, initial encounter: Secondary | ICD-10-CM

## 2017-07-19 DIAGNOSIS — C8580 Other specified types of non-Hodgkin lymphoma, unspecified site: Secondary | ICD-10-CM

## 2017-07-19 DIAGNOSIS — Z801 Family history of malignant neoplasm of trachea, bronchus and lung: Secondary | ICD-10-CM

## 2017-07-19 DIAGNOSIS — D649 Anemia, unspecified: Secondary | ICD-10-CM | POA: Insufficient documentation

## 2017-07-19 DIAGNOSIS — G620 Drug-induced polyneuropathy: Secondary | ICD-10-CM

## 2017-07-19 DIAGNOSIS — C8330 Diffuse large B-cell lymphoma, unspecified site: Secondary | ICD-10-CM

## 2017-07-19 LAB — URIC ACID: Uric Acid, Serum: 4.6 mg/dL (ref 2.3–6.6)

## 2017-07-19 LAB — COMPREHENSIVE METABOLIC PANEL
ALT: 17 U/L (ref 14–54)
AST: 24 U/L (ref 15–41)
Albumin: 3.5 g/dL (ref 3.5–5.0)
Alkaline Phosphatase: 78 U/L (ref 38–126)
Anion gap: 8 (ref 5–15)
BUN: 29 mg/dL — ABNORMAL HIGH (ref 6–20)
CO2: 23 mmol/L (ref 22–32)
Calcium: 9 mg/dL (ref 8.9–10.3)
Chloride: 102 mmol/L (ref 101–111)
Creatinine, Ser: 1.19 mg/dL — ABNORMAL HIGH (ref 0.44–1.00)
GFR calc Af Amer: 51 mL/min — ABNORMAL LOW (ref >60)
GFR calc non Af Amer: 44 mL/min — ABNORMAL LOW (ref >60)
Glucose, Bld: 99 mg/dL (ref 65–99)
Potassium: 4 mmol/L (ref 3.5–5.1)
Sodium: 133 mmol/L — ABNORMAL LOW (ref 135–145)
Total Bilirubin: 1 mg/dL (ref 0.3–1.2)
Total Protein: 6.8 g/dL (ref 6.5–8.1)

## 2017-07-19 LAB — CBC WITH DIFFERENTIAL/PLATELET
Basophils Absolute: 0.1 10*3/uL (ref 0–0.1)
Basophils Relative: 1 %
Eosinophils Absolute: 0.4 10*3/uL (ref 0–0.7)
Eosinophils Relative: 4 %
HCT: 33.3 % — ABNORMAL LOW (ref 35.0–47.0)
Hemoglobin: 11 g/dL — ABNORMAL LOW (ref 12.0–16.0)
Lymphocytes Relative: 16 %
Lymphs Abs: 1.4 10*3/uL (ref 1.0–3.6)
MCH: 29.6 pg (ref 26.0–34.0)
MCHC: 33 g/dL (ref 32.0–36.0)
MCV: 89.7 fL (ref 80.0–100.0)
Monocytes Absolute: 1.1 10*3/uL — ABNORMAL HIGH (ref 0.2–0.9)
Monocytes Relative: 13 %
Neutro Abs: 5.7 10*3/uL (ref 1.4–6.5)
Neutrophils Relative %: 66 %
Platelets: 338 10*3/uL (ref 150–440)
RBC: 3.72 MIL/uL — ABNORMAL LOW (ref 3.80–5.20)
RDW: 19.2 % — ABNORMAL HIGH (ref 11.5–14.5)
WBC: 8.7 10*3/uL (ref 3.6–11.0)

## 2017-07-19 LAB — LACTATE DEHYDROGENASE: LDH: 128 U/L (ref 98–192)

## 2017-07-19 MED ORDER — SODIUM CHLORIDE 0.9% FLUSH
10.0000 mL | INTRAVENOUS | Status: AC | PRN
  Administered 2017-07-19: 10 mL
  Filled 2017-07-19: qty 10

## 2017-07-19 MED ORDER — GABAPENTIN 100 MG PO CAPS: 100.0000 mg | ORAL_CAPSULE | Freq: Two times a day (BID) | ORAL | 3 refills | Status: DC

## 2017-07-19 MED ORDER — HEPARIN SOD (PORK) LOCK FLUSH 100 UNIT/ML IV SOLN
500.0000 [IU] | INTRAVENOUS | Status: AC | PRN
  Administered 2017-07-19: 500 [IU]

## 2017-07-19 NOTE — Progress Notes (Signed)
Patient states sometimes when she goes to BR to urinate she also will have BM that "just runs out".  Otherwise no complaints today.

## 2017-08-03 ENCOUNTER — Ambulatory Visit: Payer: Medicare HMO | Admitting: Radiation Oncology

## 2017-09-18 ENCOUNTER — Encounter
Admission: RE | Admit: 2017-09-18 | Discharge: 2017-09-18 | Disposition: A | Discharge status: Home / Self Care | Payer: Medicare HMO | Source: Ambulatory Visit | Attending: Urgent Care | Admitting: Urgent Care

## 2017-09-18 DIAGNOSIS — C8330 Diffuse large B-cell lymphoma, unspecified site: Secondary | ICD-10-CM

## 2017-09-18 DIAGNOSIS — C8580 Other specified types of non-Hodgkin lymphoma, unspecified site: Secondary | ICD-10-CM | POA: Diagnosis present

## 2017-09-18 LAB — GLUCOSE, CAPILLARY: GLUCOSE-CAPILLARY: 105 mg/dL — AB (ref 65–99)

## 2017-09-18 MED ORDER — FLUDEOXYGLUCOSE F - 18 (FDG) INJECTION
11.9000 | Freq: Once | INTRAVENOUS | Status: AC
  Administered 2017-09-18: 11.9 via INTRAVENOUS

## 2017-09-20 ENCOUNTER — Inpatient Hospital Stay (HOSPITAL_BASED_OUTPATIENT_CLINIC_OR_DEPARTMENT_OTHER): Payer: Medicare HMO | Admitting: Hematology and Oncology

## 2017-09-20 ENCOUNTER — Inpatient Hospital Stay: Payer: Medicare HMO | Attending: Hematology and Oncology

## 2017-09-20 ENCOUNTER — Other Ambulatory Visit: Payer: Self-pay

## 2017-09-20 ENCOUNTER — Ambulatory Visit
Admission: RE | Admit: 2017-09-20 | Discharge: 2017-09-20 | Disposition: A | Discharge status: Home / Self Care | Payer: Medicare HMO | Source: Ambulatory Visit | Attending: Radiation Oncology | Admitting: Radiation Oncology

## 2017-09-20 ENCOUNTER — Encounter: Payer: Self-pay | Admitting: Hematology and Oncology

## 2017-09-20 VITALS — BP 141/86 | HR 80 | Temp 95.6°F | Resp 18 | Wt 191.1 lb

## 2017-09-20 VITALS — BP 122/78 | Resp 16 | Ht 63.0 in | Wt 190.7 lb

## 2017-09-20 DIAGNOSIS — D649 Anemia, unspecified: Secondary | ICD-10-CM | POA: Insufficient documentation

## 2017-09-20 DIAGNOSIS — Z79899 Other long term (current) drug therapy: Secondary | ICD-10-CM

## 2017-09-20 DIAGNOSIS — C8330 Diffuse large B-cell lymphoma, unspecified site: Secondary | ICD-10-CM | POA: Insufficient documentation

## 2017-09-20 DIAGNOSIS — C3432 Malignant neoplasm of lower lobe, left bronchus or lung: Secondary | ICD-10-CM

## 2017-09-20 DIAGNOSIS — Z87891 Personal history of nicotine dependence: Secondary | ICD-10-CM | POA: Insufficient documentation

## 2017-09-20 DIAGNOSIS — R200 Anesthesia of skin: Secondary | ICD-10-CM

## 2017-09-20 DIAGNOSIS — R918 Other nonspecific abnormal finding of lung field: Secondary | ICD-10-CM

## 2017-09-20 DIAGNOSIS — Z7982 Long term (current) use of aspirin: Secondary | ICD-10-CM | POA: Insufficient documentation

## 2017-09-20 DIAGNOSIS — C8580 Other specified types of non-Hodgkin lymphoma, unspecified site: Secondary | ICD-10-CM

## 2017-09-20 DIAGNOSIS — C3492 Malignant neoplasm of unspecified part of left bronchus or lung: Secondary | ICD-10-CM

## 2017-09-20 DIAGNOSIS — Z801 Family history of malignant neoplasm of trachea, bronchus and lung: Secondary | ICD-10-CM | POA: Diagnosis not present

## 2017-09-20 DIAGNOSIS — Z923 Personal history of irradiation: Secondary | ICD-10-CM | POA: Diagnosis not present

## 2017-09-20 DIAGNOSIS — Z95828 Presence of other vascular implants and grafts: Secondary | ICD-10-CM

## 2017-09-20 DIAGNOSIS — C8518 Unspecified B-cell lymphoma, lymph nodes of multiple sites: Secondary | ICD-10-CM

## 2017-09-20 LAB — CBC WITH DIFFERENTIAL/PLATELET
Basophils Absolute: 0.1 10*3/uL (ref 0–0.1)
Basophils Relative: 1 %
Eosinophils Absolute: 0.3 10*3/uL (ref 0–0.7)
Eosinophils Relative: 3 %
HCT: 33.3 % — ABNORMAL LOW (ref 35.0–47.0)
Hemoglobin: 11 g/dL — ABNORMAL LOW (ref 12.0–16.0)
Lymphocytes Relative: 19 %
Lymphs Abs: 1.7 10*3/uL (ref 1.0–3.6)
MCH: 29.7 pg (ref 26.0–34.0)
MCHC: 33.1 g/dL (ref 32.0–36.0)
MCV: 89.7 fL (ref 80.0–100.0)
Monocytes Absolute: 1.3 10*3/uL — ABNORMAL HIGH (ref 0.2–0.9)
Monocytes Relative: 15 %
Neutro Abs: 5.8 10*3/uL (ref 1.4–6.5)
Neutrophils Relative %: 62 %
Platelets: 317 10*3/uL (ref 150–440)
RBC: 3.71 MIL/uL — ABNORMAL LOW (ref 3.80–5.20)
RDW: 17.1 % — ABNORMAL HIGH (ref 11.5–14.5)
WBC: 9.2 10*3/uL (ref 3.6–11.0)

## 2017-09-20 LAB — COMPREHENSIVE METABOLIC PANEL
ALT: 16 U/L (ref 14–54)
AST: 24 U/L (ref 15–41)
Albumin: 3.5 g/dL (ref 3.5–5.0)
Alkaline Phosphatase: 69 U/L (ref 38–126)
Anion gap: 7 (ref 5–15)
BUN: 24 mg/dL — ABNORMAL HIGH (ref 6–20)
CO2: 24 mmol/L (ref 22–32)
Calcium: 9.2 mg/dL (ref 8.9–10.3)
Chloride: 101 mmol/L (ref 101–111)
Creatinine, Ser: 1.08 mg/dL — ABNORMAL HIGH (ref 0.44–1.00)
GFR calc Af Amer: 57 mL/min — ABNORMAL LOW (ref >60)
GFR calc non Af Amer: 49 mL/min — ABNORMAL LOW (ref >60)
Glucose, Bld: 117 mg/dL — ABNORMAL HIGH (ref 65–99)
Potassium: 4.1 mmol/L (ref 3.5–5.1)
Sodium: 132 mmol/L — ABNORMAL LOW (ref 135–145)
Total Bilirubin: 1 mg/dL (ref 0.3–1.2)
Total Protein: 6.4 g/dL — ABNORMAL LOW (ref 6.5–8.1)

## 2017-09-20 LAB — URIC ACID: Uric Acid, Serum: 4.2 mg/dL (ref 2.3–6.6)

## 2017-09-20 LAB — LACTATE DEHYDROGENASE: LDH: 120 U/L (ref 98–192)

## 2017-09-20 MED ORDER — SODIUM CHLORIDE 0.9% FLUSH
10.0000 mL | INTRAVENOUS | Status: AC | PRN
  Administered 2017-09-20: 10 mL
  Filled 2017-09-20: qty 10

## 2017-09-20 MED ORDER — HEPARIN SOD (PORK) LOCK FLUSH 100 UNIT/ML IV SOLN
500.0000 [IU] | INTRAVENOUS | Status: AC | PRN
  Administered 2017-09-20: 500 [IU]

## 2017-09-20 NOTE — Progress Notes (Signed)
.[facility] day:  09/20/2017   Chief Complaint: Toni Griffith is a 77 y.o. 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  HPI:  The patient was last seen in the medical oncology clinic on 04/16/2017.  At that time, she felt good.  She denied any B symptoms.  Exam was unremarkable.   She saw Dr. Baruch Gouty on 05/18/2017.  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  She described numbness in her fingers.  Plan was for treatment of the the RUL lesion.  She underwent SBRT (5 treatments from 05/23/2017 - 06/18/2017).  PET scan on 09/18/2017 revealed a mixed appearance, with marked improvement in the prior 2 pulmonary nodules; but with new Deauville 5 left common iliac lymph node enlargement; new Deauville 5 right paraspinal lesion; and new Deauville 4 left infrahilar nodal activity.  There was a focus of accentuated metabolic activity which had been present along the right lower submandibular gland but currently seemed to project over the subcutaneous tissues without CT correlate. Current maximum SUV 10.5, previously 9.7. I am uncertain whether this is artifact or simply a finding of lymphoma which remains relatively occult on the CT data.  There was increased accentuated activity along the left lateral spurring at L2-3. While typically such activity is inflammatory due to spurring, this had increased significantly since the prior exam and merit surveillance. There was a new 8 by 5 mm right upper lobe pulmonary nodule, Deauville 2, which also merits surveillance.  Symptomatically, patient is doing well. She denies any acute concerns today. Patient denies respiratory symptoms. She has not experienced any B symptoms or recent infections. Patient denies any appreciable adenopathy. 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 Patient notes that her LEFT flank "throbs" when she is in bed, however the pain is "fine" when she walks around.   She continues to eat well. Her weight is down 1 pound.  Patient denies pain in the clinic today.    Past Medical History:  Diagnosis Date  . Arthritis   . 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 Deaf   . Hypertension     Past Surgical History:  Procedure Laterality Date  . CARDIAC SURGERY  1993  . CORONARY ARTERY BYPASS GRAFT    . CYST REMOVAL HAND    . PERIPHERAL VASCULAR CATHETERIZATION N/A 04/06/2016   Procedure: Glori Luis Cath Insertion;  Surgeon: Algernon Huxley, MD;  Location: [facility];  Service: Cardiovascular;  Laterality: N/A;    Family History  Problem Relation Age of Onset  . Lung cancer Mother   . Lung cancer Father   . Diabetes Maternal Uncle   . Myasthenia gravis Maternal Grandmother   . Leukemia Maternal Grandfather     Social History:  reports that she quit smoking about 22 months ago. she has never used smokeless tobacco. She reports that she drinks alcohol. She reports that she does not use drugs.  She smoked 1 1/2 packs per day for 20 years (30 pack year smoking history) until 10/2015.  She is hearing impaired (deaf).  She lives in [place] with her boyfriend.  Her daughter lives in [place].  Her daughter, [name]'s contact number is [phone].  The patient is accompanied [name] and the interpreter (sign language)  today.  She has other family members on FaceTime during the visit.  Allergies: No Known Allergies  Current Medications: Current Outpatient Medications  Medication Sig Dispense Refill  . acetaminophen (TYLENOL) 325 MG tablet Take 650 mg by mouth.    Marland Kitchen allopurinol (ZYLOPRIM) 300 MG tablet TAKE 1 TABLET BY MOUTH EVERY DAY 30 tablet 5  . aspirin 325 MG tablet Take 325 mg by mouth daily.    Marland Kitchen atorvastatin (LIPITOR) 80 MG tablet Take 80 mg by mouth.    . B Complex Vitamins (B COMPLEX 1 PO) Take by  mouth.    . celecoxib (CELEBREX) 200 MG capsule     . Cholecalciferol (VITAMIN D3) 2000 units capsule Take 2,000 Units by mouth daily.    Marland Kitchen FLUoxetine (PROZAC) 40 MG capsule     . gabapentin (NEURONTIN) 100 MG capsule Take 1 capsule (100 mg total) by mouth 2 (two) times daily. 60 capsule 3  . KLOR-CON 10 10 MEQ tablet TAKE 1 TABLET (10 MEQ TOTAL) BY MOUTH 2 (TWO) TIMES DAILY. 60 tablet 0  . lidocaine-prilocaine (EMLA) cream Apply cream 1 hour before chemotherapy treatment and place small peive of saran wrap over cream to protect clothing 30 g 1  . naproxen sodium (ANAPROX) 220 MG tablet Take 220 mg by mouth as needed.    . ondansetron (ZOFRAN ODT) 4 MG disintegrating tablet Take 1 tablet (4 mg total) by mouth every 8 (eight) hours as needed for nausea or vomiting. 20 tablet 0  . ondansetron (ZOFRAN) 8 MG tablet Take 1 tablet (8 mg total) by mouth 2 (two) times daily as needed for refractory nausea / vomiting. Start on day 3 after cyclophosphamide chemotherapy. 30 tablet 1  . pantoprazole (PROTONIX) 40 MG tablet Take 40 mg by mouth.    . promethazine (PHENERGAN) 25 MG tablet Take 1 tablet (25 mg total) every 6 (six) hours as needed by mouth for nausea or vomiting. 10 tablet 0  . triamterene-hydrochlorothiazide (DYAZIDE) 37.5-25 MG capsule Take by mouth.    . vitamin B-12 (CYANOCOBALAMIN) 500 MCG tablet Take 500 mcg by mouth daily.    . vitamin E 400 UNIT capsule Take by mouth.     No current facility-administered medications for this visit.    Facility-Administered Medications Ordered in Other Visits  Medication Dose Route Frequency Provider Last Rate Last Dose  . sodium chloride flush (NS) 0.9 % injection 10 mL  10 mL Intravenous PRN Lequita Asal, MD   10 mL at 04/05/17 1034    Review of Systems:  GENERAL:  Feels good. No fever, chills or sweats.  Weight down 1 pound. PERFORMANCE STATUS (ECOG):  2 HEENT:  Deaf.  No visual changes, runny nose, sore throat, mouth sores or  tenderness. Lungs: Shortness of breath with exertion.  No cough.  No hemoptysis. Cardiac:  No chest pain, palpitations, orthopnea, or PND.  GI:  Eating well.  No nausea, vomiting, diarrhea, constipation, melena or hematochezia. GU:  No urgency, frequency, dysuria, or hematuria. Musculoskeletal:  Chronic left knee problems.  No back pain.  No muscle tenderness. Extremities:  No pain or swelling. Skin:  No rashes or skin changes. Neuro:  Little numbness in fingertips (stable). No headache, numbness or weakness, balance or coordination issues. Endocrine:  No diabetes, thyroid issues, hot flashes or night sweats. Psych:  No mood changes, depression or anxiety. Pain:  No focal pain. Review of systems:  All other systems reviewed and found to be negative.  Physical Exam:  Blood pressure Marland Kitchen)  141/86, pulse 80, temperature (!) 95.6 F (35.3 C), temperature source Tympanic, resp. rate 18, weight 191 lb 2 oz (86.7 kg). GENERAL:  Elderly woman sitting comfortably in the exam room in no acute distress. She needs assistance onto the table. MENTAL STATUS:  Alert and oriented to person, place and time. HEAD:  Curly gray hair.  Normocephalic, atraumatic, face symmetric, no Cushingoid features. EYES:  Hazel eyes.  Pupils equal round and reactive to light and accomodation.  No conjunctivitis or scleral icterus. ENT:  Oropharynx clear without lesion.  Upper dentures.  Tongue normal. Mucous membranes moist.  RESPIRATORY:  Clear to auscultation without rales, wheezes or rhonchi. CARDIOVASCULAR:  Regular rate and rhythm without murmur, rub or gallop. ABDOMEN:  Ventral hernia.  Soft, non-tender, with active bowel sounds, and no appreciable hepatosplenomegaly.  No masses. SKIN:  No rashes, ulcers or lesions. EXTREMITIES: No edema, no skin discoloration or tenderness.  No palpable cords. LYMPH NODES: Palpable 1-2 cm LEFT axillary adenopathy. No palpable cervical, supraclavicular, or inguinal adenopathy   NEUROLOGICAL:  Lower extremity strength and sensation symmetric.  No foot drop. PSYCH:  Appropriate.   Infusion on 09/20/2017  Component Date Value Ref Range Status  . WBC 09/20/2017 9.2  3.6 - 11.0 K/uL Final  . RBC 09/20/2017 3.71* 3.80 - 5.20 MIL/uL Final  . Hemoglobin 09/20/2017 11.0* 12.0 - 16.0 g/dL Final  . HCT 09/20/2017 33.3* 35.0 - 47.0 % Final  . MCV 09/20/2017 89.7  80.0 - 100.0 fL Final  . MCH 09/20/2017 29.7  26.0 - 34.0 pg Final  . MCHC 09/20/2017 33.1  32.0 - 36.0 g/dL Final  . RDW 09/20/2017 17.1* 11.5 - 14.5 % Final  . Platelets 09/20/2017 317  150 - 440 K/uL Final  . Neutrophils Relative % 09/20/2017 62  % Final  . Neutro Abs 09/20/2017 5.8  1.4 - 6.5 K/uL Final  . Lymphocytes Relative 09/20/2017 19  % Final  . Lymphs Abs 09/20/2017 1.7  1.0 - 3.6 K/uL Final  . Monocytes Relative 09/20/2017 15  % Final  . Monocytes Absolute 09/20/2017 1.3* 0.2 - 0.9 K/uL Final  . Eosinophils Relative 09/20/2017 3  % Final  . Eosinophils Absolute 09/20/2017 0.3  0 - 0.7 K/uL Final  . Basophils Relative 09/20/2017 1  % Final  . Basophils Absolute 09/20/2017 0.1  0 - 0.1 K/uL Final   Performed at [facility], [address] [place], [place] [address]  Hospital Outpatient Visit on 09/18/2017  Component Date Value Ref Range Status  . Glucose-Capillary 09/18/2017 105* 65 - 99 mg/dL Final    Radiology studies: 02/20/2016:  Chest, abdomen and pelvic CT revealed a 3.7 cm subpleural mass within the left lower lobe, a 1.1 cm right lower lobe subpleural nodule , 9 mm RUL nodule, and 0.4 cm lingular nodule.  Between the left second, third, and fourth ribs there was 1.9 cm soft tissue mass. There was a 2.5 cm low-attenuation nodule in the left lobe of the thyroid.   There were prominent left axillary nodes (up to 1.0 cm).   There was no definite mediastinal adenopathy. There was a 5.5 x 4.6 cm cystic lesion in the left hepatic lobe.  There was a 3.1 cm nodule in the right adrenal  gland. There was a 1.2 cm right retrocaval lymph node with additional prominent subcentimeter retroperitoneal nodes. There was 1.0 cm left common iliac node. There was a 6.2 x 5.2 cm soft tissue mass within the right ilium.  02/21/2016:  Head  MRI revealed no evidence of malignancy. Thyroid ultrasound on 02/21/2016 revealed a 2.4 cm solid nodule in the inferior aspect of the left lobe 03/03/2016:  PET scan revealed a hypermetabolic left lower lobe mass, bilateral pleural and pulmonary parenchymal nodular metastasis, a large pleural lesion invading the anterior left chest wall.   There was activity in the superior left ocular orbit.  There was a large lesion centered in the right iliac wing with soft tissue extension.  There were hypermetabolic right external iliac lymph nodes 06/06/2016:  PET scan revealed significant interval response to therapy.  There was significant decrease in size and FDG uptake associated with left lower lobe lung lesion.  There was resolution of left-sided pleural base metastasis and left axillary and anterior mediastinal nodal metastasis.  There was resolution of previous hypermetabolic soft tissue metastasis/nodal metastasis posterior to the IVC.  There was marked improvement in hypermetabolic metastasis involving the right iliac wing.  There was persistent right upper lobe lung nodule which exhibits intense radiotracer uptake which may represent a focus of metastatic disease or synchronous primary lung neoplasm. 06/01/2017:  PET scan revealed no new or progressive findings.  The 9 mm RUL pulmonary nodule remained hypermetabolic (SUV 40.0, previously 7.3) and no change in size.  The index lesion in the posterior left lower lobe was minimally smaller on CT imaging with very low level FDG uptake (SUV 1.6).  There was no change in the appearance at site of previous disease at the right iliac wing (SUV 1.9 compared to 2.6 previously). 01/29/2017:  PET scan revealed interval progression of  hypermetabolic nodules in the right upper (13 mm compared to 9 mm; SUV 10) and left lower lobes (26 mm compared to 22 mm; SUV 7.5).  There was new hypermetabolic focus of activity along the anterior left pleura although no underlying pleural or lung mass was evident. 09/18/2017:  PET scan revealed a mixed appearance, with marked improvement in the prior 2 pulmonary nodules; but with new Deauville 5 left common iliac lymph node enlargement; new Deauville 5 right paraspinal lesion; and new Deauville 4 left infrahilar nodal activity.  There was a focus of accentuated metabolic activity which had been present along the right lower submandibular gland but currently seemed to project over the subcutaneous tissues without CT correlate. Current maximum SUV 10.5, previously 9.7.  There was increased accentuated activity along the left lateral spurring at L2-3. While typically such activity is inflammatory due to spurring, this had increased significantly since the prior exam and merit surveillance. There was a new 8 by 5 mm right upper lobe pulmonary nodule, Deauville 2, which also merits surveillance.   Assessment:  Ilsa Bonello is a 77 y.o. female with stage IVB B-cell lymphoma, unclassifiable, with features intermediate between diffuse large B-cell lymphoma and classical Hodgkin's lymphoma ("gray zone" lymphoma).  She underwent right iliac wing bone/bone marrow biopsy on 03/08/2016.  Head MRI on 02/21/2016 revealed no evidence of malignancy. 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   There was activity in the superior left ocular orbit.  There was a large lesion centered in the right iliac wing with soft tissue extension.  There were hypermetabolic right  external iliac lymph nodes  She was admitted to [facility] in [facility].  She  had renal failure and hypercalcemia. She was treated with IVF, calcitonin and Zometa.The patient has a history of coronary artery disease s/p CABG in 1993.  She has no history of heart failure.  She is deaf and requires an interpreter.  Echo on 04/04/2016 revealed an EF of 55-60%.  Hepatitis B and C testing was negative on 03/30/2016.   G6PD assay was normal.  She declined LP with MTX prophylaxis.  She received 6 cycles of mini-RCHOP (04/07/2016 - 07/21/2016).  She has had some transient fingertip numbness.  She received radiation to the right iliac wing,  from 10/11/2016 - 11/01/2016.  She received 3000 cGy over 3 weeks.  PET scan on 01/29/2017 revealed interval progression of hypermetabolic nodules in the right upper (13 mm compared to 9 mm; SUV 10) and left lower lobes (26 mm compared to 22 mm; SUV 7.5).  There was new hypermetabolic focus of activity along the anterior left pleura although no underlying pleural or lung mass was evident.  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  She underwent SBRT 03/26/2017 - 04/09/2017.  She received 5000 cGy in 5 fractions to the left lower lobe lesion.  She underwent SBRT to a RUL lesion from 05/23/2017 - 06/18/2017.  PET scan on 09/18/2017 revealed a mixed appearance, with marked improvement in the prior 2 pulmonary nodules; but with new Deauville 5 left common iliac lymph node enlargement; new Deauville 5 right paraspinal lesion; and new Deauville 4 left infrahilar nodal activity.  There was a focus of accentuated metabolic activity which had been present along the right lower submandibular gland but currently seemed to project over the subcutaneous tissues without CT correlate. Current maximum SUV 10.5, previously 9.7. There was increased accentuated activity along the left lateral spurring at L2-3. While typically such  activity is inflammatory due to spurring, this had increased significantly since the prior exam and merit surveillance. There was a new 8 by 5 mm right upper lobe pulmonary nodule, Deauville 2.  She has a normocytic anemia.  Labs on 04/17/2016 revealed an elevated ferritin (444), iron saturation (11%), low TIBC (240), B12 (2409), and folate (10.8).  Symptomatically, she feels good.  She denies any B symptoms.  Exam reveals a single palpable LEFT axillary lymph node. Sodium low at 132.   Plan: 1.  Labs today:  CBC with diff, CMP, LDH, uric acid. 2.  Discuss interval SBRT to RUL pulmonary nodule. 3.  Discuss interval PET scan. Discuss improvement with regards to patient's SCC of the lung. Discussed progression of lymphoma and need for further treatment. Reviewed with patient that another biopsy may be required to document progressive disease.  Discuss possible treatment options that she could tolerate (Rituxan + gemcitabine or Revlimid).  Consider RCEPP, or GemOx + Rituxan or GDP + Rituxan.  Discuss referral to [facility] (Dr. Lonia Blood) for second opinion. [facility] may have protocol options available. Patient ok with her information being sent to [facility]. Asks that [facility] contact her daughter [name]) at [phone]. 4.  Continue port flushes every 4-6 weeks. 5.  RTC after patient sees Dr. Lonia Blood.  We will call her with an appointment.    Honor Loh, NP  09/20/2017, 10:57 AM   I saw and evaluated the patient, participating in the key portions of the service and reviewing pertinent diagnostic studies and records.  I reviewed the nurse practitioner's note and agree with the findings and the plan.  The assessment and plan were discussed with the patient.  More than 40 minutes were spent with the patient and her family.  Multiple questions were asked and answered.   Lequita Asal, MD  09/20/2017, 10:57 AM

## 2017-09-20 NOTE — Progress Notes (Signed)
Patient here for follow up no changes since last appt.

## 2017-09-20 NOTE — Progress Notes (Signed)
Patient here today for PET results.  Offers no complaints. Accompanied by daughter and interpreter.

## 2017-09-20 NOTE — Progress Notes (Signed)
Radiation Oncology Follow up Note  Name: Toni Griffith   Date:   09/20/2017 MRN:  [id number] DOB: 1941/01/06    This 77 y.o. female presents to the clinic today for three-month follow-up status post SB RT to her left lower lobe squamous cell carcinoma in patient also previously treated with palliation to her pelvis for diffuse B-cell lymphoma.  REFERRING PROVIDER: Dionne Bucy., MD  HPI: Patient is a 77 year old female now 3 months out having completed SB RT to her left lower lobe for a squamous cell carcinoma. She also had been previously treated to her pelvis for palliation with involvement of diffuse B-cell lymphoma. Patient is deaf she seen today and is doing well she specifically denies cough hemoptysis chest tightness fever chills or night sweats. She is accompanied by an interpreter today. She recently had a PET CT scan. Showing marked improvement in 2 pulmonary nodules. There is hypermetabolic activity in the common iliac lymph node and right paraspinal lesion and left infrahilar nodal activity. The right upper lobe nodule is not \\r  hypermetabolic on this exam.  COMPLICATIONS OF TREATMENT: none  FOLLOW UP COMPLIANCE: keeps appointments   PHYSICAL EXAM:  BP 122/78 (BP Location: Right Wrist, Patient Position: Sitting, Cuff Size: Small)   Resp 16   Ht 5\' 3"  (1.6 m)   Wt 190 lb 11.2 oz (86.5 kg)   BMI 33.78 kg/m  Well-developed well-nourished patient in NAD. HEENT reveals PERLA, EOMI, discs not visualized.  Oral cavity is clear. No oral mucosal lesions are identified. Neck is clear without evidence of cervical or supraclavicular adenopathy. Lungs are clear to A&P. Cardiac examination is essentially unremarkable with regular rate and rhythm without murmur rub or thrill. Abdomen is benign with no organomegaly or masses noted. Motor sensory and DTR levels are equal and symmetric in the upper and lower extremities. Cranial nerves II through XII are grossly intact. Proprioception is intact.  No peripheral adenopathy or edema is identified. No motor or sensory levels are noted. Crude visual fields are within normal range.  RADIOLOGY RESULTS: PET CT scan reviewed and compatible with the above-stated findings  PLAN: At this time patient has probable slight progression of her lymphoma with multiple nodal areas. The right upper lobe lung lesion I would opt to follow at this time rather than treat with SB RT. Otherwise from a chest standpoint she is stable and doing well. I've asked to see her back in 6 months for follow-up will review her CT scan of the chest at that time. Will further discuss the case with medical oncology and patient is seeing medical oncology after my appointment today. Family and patient will comprehend my treatment plan well.  I would like to take this opportunity to thank you for allowing me to participate in the care of your patient.Toni Filbert, MD

## 2017-09-21 ENCOUNTER — Telehealth: Payer: Self-pay | Admitting: *Deleted

## 2017-09-21 NOTE — Telephone Encounter (Signed)
Called patient's daughter to let her know we made the referral to [facility] today.

## 2017-09-21 NOTE — Telephone Encounter (Signed)
-----   Message from Wilburn Cornelia sent at 09/21/2017  1:44 PM EST ----- Regarding: [facility] appt? Contact: [phone] Pt daughter called and ask if we or she was to make [facility] appt for pt from yesterday visit.

## 2017-09-25 ENCOUNTER — Telehealth: Payer: Self-pay | Admitting: *Deleted

## 2017-09-25 NOTE — Telephone Encounter (Signed)
Belinda called from [facility] to let [name] know that patient has already been seen in their office (September 17, 2016) by Dr. Reita Chard.  She has made patient an appointment with him for Thursday, March 7th for a 1:30 arrival time.  She will call the patient.

## 2017-10-08 ENCOUNTER — Inpatient Hospital Stay: Payer: Medicare HMO | Admitting: Hematology and Oncology

## 2017-10-08 ENCOUNTER — Encounter: Payer: Self-pay | Admitting: Urgent Care

## 2017-10-09 ENCOUNTER — Other Ambulatory Visit: Payer: Self-pay | Admitting: Urgent Care

## 2017-10-09 DIAGNOSIS — Z8579 Personal history of other malignant neoplasms of lymphoid, hematopoietic and related tissues: Secondary | ICD-10-CM

## 2017-10-10 ENCOUNTER — Telehealth: Payer: Self-pay | Admitting: *Deleted

## 2017-10-10 NOTE — Telephone Encounter (Signed)
Called to inquire about appointment for biopsy, they have not heard anything about appointment. Please advise

## 2017-10-10 NOTE — Telephone Encounter (Signed)
[name] informed of NP response and will await return call

## 2017-10-10 NOTE — Telephone Encounter (Signed)
They have not given me an appointment as of yet. The radiologist has to "approve" the procedure before they will schedule it. We will let them know when this happens.

## 2017-10-12 ENCOUNTER — Encounter: Payer: Self-pay | Admitting: Urgent Care

## 2017-10-15 ENCOUNTER — Telehealth: Payer: Self-pay | Admitting: *Deleted

## 2017-10-15 ENCOUNTER — Other Ambulatory Visit: Payer: Self-pay | Admitting: Student

## 2017-10-15 NOTE — Telephone Encounter (Signed)
Daughter called asking of patient is to be seen by doctor this week or just have the biopsy this week and see her later.Please advise

## 2017-10-15 NOTE — Telephone Encounter (Signed)
We will plan on seeing her AFTER the biopsy. We will call her for an appointment when all of the results have been received.

## 2017-10-15 NOTE — Telephone Encounter (Signed)
[name] informed

## 2017-10-16 ENCOUNTER — Other Ambulatory Visit (HOSPITAL_COMMUNITY): Payer: Self-pay | Admitting: Interventional Radiology

## 2017-10-16 ENCOUNTER — Ambulatory Visit
Admission: RE | Admit: 2017-10-16 | Discharge: 2017-10-16 | Disposition: A | Discharge status: Home / Self Care | Payer: Medicare HMO | Source: Ambulatory Visit | Attending: Urgent Care | Admitting: Urgent Care

## 2017-10-16 DIAGNOSIS — Z8579 Personal history of other malignant neoplasms of lymphoid, hematopoietic and related tissues: Secondary | ICD-10-CM | POA: Insufficient documentation

## 2017-10-16 LAB — CBC
HCT: 34.9 % — ABNORMAL LOW (ref 35.0–47.0)
HEMOGLOBIN: 11.5 g/dL — AB (ref 12.0–16.0)
MCH: 29.3 pg (ref 26.0–34.0)
MCHC: 33.1 g/dL (ref 32.0–36.0)
MCV: 88.5 fL (ref 80.0–100.0)
PLATELETS: 356 10*3/uL (ref 150–440)
RBC: 3.94 MIL/uL (ref 3.80–5.20)
RDW: 17 % — ABNORMAL HIGH (ref 11.5–14.5)
WBC: 8.8 10*3/uL (ref 3.6–11.0)

## 2017-10-16 LAB — PROTIME-INR
INR: 0.99
Prothrombin Time: 13 seconds (ref 11.4–15.2)

## 2017-10-16 LAB — APTT: aPTT: 35 seconds (ref 24–36)

## 2017-10-16 MED ORDER — MIDAZOLAM HCL 2 MG/2ML IJ SOLN
INTRAMUSCULAR | Status: AC
  Filled 2017-10-16: qty 4

## 2017-10-16 MED ORDER — HEPARIN SOD (PORK) LOCK FLUSH 100 UNIT/ML IV SOLN
INTRAVENOUS | Status: AC
  Administered 2017-10-16: 17:00:00
  Filled 2017-10-16: qty 5

## 2017-10-16 MED ORDER — FENTANYL CITRATE (PF) 100 MCG/2ML IJ SOLN
INTRAMUSCULAR | Status: AC | PRN
  Administered 2017-10-16 (×2): 50 ug via INTRAVENOUS

## 2017-10-16 MED ORDER — ONDANSETRON 4 MG PO TBDP
4.0000 mg | ORAL_TABLET | Freq: Once | ORAL | Status: AC
  Administered 2017-10-16: 4 mg via ORAL

## 2017-10-16 MED ORDER — OXYCODONE-ACETAMINOPHEN 5-325 MG PO TABS
1.0000 | ORAL_TABLET | Freq: Once | ORAL | Status: AC
  Administered 2017-10-16: 1 via ORAL

## 2017-10-16 MED ORDER — OXYCODONE-ACETAMINOPHEN 5-325 MG PO TABS
ORAL_TABLET | ORAL | Status: AC
  Filled 2017-10-16: qty 1

## 2017-10-16 MED ORDER — FENTANYL CITRATE (PF) 100 MCG/2ML IJ SOLN
INTRAMUSCULAR | Status: AC
  Filled 2017-10-16: qty 4

## 2017-10-16 MED ORDER — ONDANSETRON 4 MG PO TBDP
ORAL_TABLET | ORAL | Status: AC
  Filled 2017-10-16: qty 1

## 2017-10-16 MED ORDER — SODIUM CHLORIDE 0.9 % IV SOLN: INTRAVENOUS | Status: DC

## 2017-10-16 MED ORDER — MIDAZOLAM HCL 2 MG/2ML IJ SOLN
INTRAMUSCULAR | Status: AC | PRN
  Administered 2017-10-16 (×2): 1 mg via INTRAVENOUS

## 2017-10-16 NOTE — OR Nursing (Signed)
Pt reports pain in lower back which is chronic which is exacerbated by procedure, shooting down left leg. Dr Barbie Banner informed. Pt also reports nausea. Percocet one tablet and Zofran under tongue dissolving tablet given per order.

## 2017-10-16 NOTE — OR Nursing (Signed)
Nausea improved.

## 2017-10-16 NOTE — Procedures (Signed)
Right paraspinal mass Bx 18 g core times three EBL 0 Comp 0

## 2017-10-16 NOTE — Discharge Instructions (Signed)
Moderate Conscious Sedation, Adult, Care After These instructions provide you with information about caring for yourself after your procedure. Your health care provider may also give you more specific instructions. Your treatment has been planned according to current medical practices, but problems sometimes occur. Call your health care provider if you have any problems or questions after your procedure. What can I expect after the procedure? After your procedure, it is common:  To feel sleepy for several hours.  To feel clumsy and have poor balance for several hours.  To have poor judgment for several hours.  To vomit if you eat too soon.  Follow these instructions at home: For at least 24 hours after the procedure:   Do not: ? Participate in activities where you could fall or become injured. ? Drive. ? Use heavy machinery. ? Drink alcohol. ? Take sleeping pills or medicines that cause drowsiness. ? Make important decisions or sign legal documents. ? Take care of children on your own.  Rest. Eating and drinking  Follow the diet recommended by your health care provider.  If you vomit: ? Drink water, juice, or soup when you can drink without vomiting. ? Make sure you have little or no nausea before eating solid foods. General instructions  Have a responsible adult stay with you until you are awake and alert.  Take over-the-counter and prescription medicines only as told by your health care provider.  If you smoke, do not smoke without supervision.  Keep all follow-up visits as told by your health care provider. This is important. Contact a health care provider if:  You keep feeling nauseous or you keep vomiting.  You feel light-headed.  You develop a rash.  You have a fever. Get help right away if:  You have trouble breathing. This information is not intended to replace advice given to you by your health care provider. Make sure you discuss any questions you have  with your health care provider. Document Released: 05/07/2013 Document Revised: 12/20/2015 Document Reviewed: 11/06/2015 Elsevier Interactive Patient Education  2018 [place]. Needle Biopsy, Care After Refer to this sheet in the next few weeks. These instructions provide you with information about caring for yourself after your procedure. Your health care provider may also give you more specific instructions. Your treatment has been planned according to current medical practices, but problems sometimes occur. Call your health care provider if you have any problems or questions after your procedure. What can I expect after the procedure? After your procedure, it is common to have soreness, bruising, or mild pain at the biopsy site. This should go away in a few days. Follow these instructions at home:  Rest as directed by your health care provider.  Take medicines only as directed by your health care provider.  There are many different ways to close and cover the biopsy site, including stitches (sutures), skin glue, and adhesive strips. Follow your health care provider's instructions about: ? Biopsy site care. ? Bandage (dressing) changes and removal. ? Biopsy site closure removal.  Check your biopsy site every day for signs of infection. Watch for: ? Redness, swelling, or pain. ? Fluid, blood, or pus. Contact a health care provider if:  You have a fever.  You have redness, swelling, or pain at the biopsy site that lasts longer than a few days.  You have fluid, blood, or pus coming from the biopsy site.  You feel nauseous.  You vomit. Get help right away if:  You have shortness of  breath.  You have trouble breathing.  You have chest pain.  You feel dizzy or you faint.  You have bleeding that does not stop with pressure or a bandage.  You cough up blood.  You have pain in your abdomen. This information is not intended to replace advice given to you by your health care  provider. Make sure you discuss any questions you have with your health care provider. Document Released: 12/01/2014 Document Revised: 12/23/2015 Document Reviewed: 07/13/2014 Elsevier Interactive Patient Education  [employer].

## 2017-10-16 NOTE — Telephone Encounter (Signed)
  Was the procedure approved?  M

## 2017-10-16 NOTE — Telephone Encounter (Signed)
Yes. The patient has her biopsy today (10/16/2017).

## 2017-10-16 NOTE — Consult Note (Signed)
Chief Complaint: Patient was seen in consultation today for No chief complaint on file.  at the request of Terrell,Grace E.  Referring Physician(s): Terrell,Grace E.  Supervising Physician: Marybelle Killings  Patient Status: [facility] - Out-pt  History of Present Illness: Toni Griffith is a 77 y.o. 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 She is referred for biopsy. Dr Annamaria Boots reviewed images with [name] and agreed to pursue a right paraspinal lesion biopsy.  Past Medical History:  Diagnosis Date  . Arthritis   . Cancer [facility])    lymphoma-right hip  . Deaf   . Hypertension     Past Surgical History:  Procedure Laterality Date  . CARDIAC SURGERY  1993  . CORONARY ARTERY BYPASS GRAFT    . CYST REMOVAL HAND    . PERIPHERAL VASCULAR CATHETERIZATION N/A 04/06/2016   Procedure: Glori Luis Cath Insertion;  Surgeon: Algernon Huxley, MD;  Location: [facility];  Service: Cardiovascular;  Laterality: N/A;    Allergies: Patient has no known allergies.  Medications: Prior to Admission medications   Medication Sig Start Date End Date Taking? Authorizing Provider  acetaminophen (TYLENOL) 325 MG tablet Take 650 mg by mouth.   Yes [provider]  allopurinol (ZYLOPRIM) 300 MG tablet TAKE 1 TABLET BY MOUTH EVERY DAY 06/03/17  Yes Corcoran, Drue Second, MD  aspirin 325 MG tablet Take 325 mg by mouth daily.   Yes [provider]  B Complex Vitamins (B COMPLEX 1 PO) Take by mouth.   Yes [provider]  Cholecalciferol (VITAMIN D3) 2000 units capsule Take 2,000 Units by mouth daily.   Yes [provider]  FLUoxetine (PROZAC) 40 MG capsule  04/20/17  Yes [provider]  gabapentin (NEURONTIN) 100 MG capsule Take 1 capsule (100 mg total) by mouth 2 (two) times daily. 07/19/17  Yes Karen Kitchens, NP  KLOR-CON 10 10 MEQ tablet TAKE 1 TABLET (10 MEQ TOTAL) BY MOUTH 2 (TWO) TIMES  DAILY. 10/06/16  Yes Lequita Asal, MD  lidocaine-prilocaine (EMLA) cream Apply cream 1 hour before chemotherapy treatment and place small peive of saran wrap over cream to protect clothing 04/06/16  Yes Corcoran, Melissa C, MD  pantoprazole (PROTONIX) 40 MG tablet Take 40 mg by mouth. 02/04/16  Yes [provider]  triamterene-hydrochlorothiazide (DYAZIDE) 37.5-25 MG capsule Take by mouth. 10/14/15 10/16/17 Yes [provider]  vitamin B-12 (CYANOCOBALAMIN) 500 MCG tablet Take 500 mcg by mouth daily.   Yes [provider]  vitamin E 400 UNIT capsule Take by mouth.   Yes [provider]  atorvastatin (LIPITOR) 80 MG tablet Take 80 mg by mouth. 10/14/15 09/20/17  [provider]  celecoxib (CELEBREX) 200 MG capsule  04/27/17   [provider]  naproxen sodium (ANAPROX) 220 MG tablet Take 220 mg by mouth as needed.    [provider]  ondansetron (ZOFRAN ODT) 4 MG disintegrating tablet Take 1 tablet (4 mg total) by mouth every 8 (eight) hours as needed for nausea or vomiting. Patient not taking: Reported on 10/16/2017 04/24/16   Loney Hering, MD  ondansetron (ZOFRAN) 8 MG tablet Take 1 tablet (8 mg total) by mouth 2 (two) times daily as needed for refractory nausea / vomiting. Start on day 3 after cyclophosphamide chemotherapy. Patient not taking: Reported on 10/16/2017 04/06/16   Lequita Asal, MD  promethazine (PHENERGAN) 25 MG tablet Take 1 tablet (25  mg total) every 6 (six) hours as needed by mouth for nausea or vomiting. Patient not taking: Reported on 10/16/2017 06/13/17   Noreene Filbert, MD     Family History  Problem Relation Age of Onset  . Lung cancer Mother   . Lung cancer Father   . Diabetes Maternal Uncle   . Myasthenia gravis Maternal Grandmother   . Leukemia Maternal Grandfather     Social History   Socioeconomic History  . Marital status: Single    Spouse name: Not on file  . Number of children: Not on file    . Years of education: Not on file  . Highest education level: Not on file  Social Needs  . Financial resource strain: Not on file  . Food insecurity - worry: Not on file  . Food insecurity - inability: Not on file  . Transportation needs - medical: Not on file  . Transportation needs - non-medical: Not on file  Occupational History  . Not on file  Tobacco Use  . Smoking status: Former Smoker    Last attempt to quit: 11/21/2015    Years since quitting: 1.9  . Smokeless tobacco: Never Used  Substance and Sexual Activity  . Alcohol use: Yes    Comment: rare  . Drug use: No  . Sexual activity: No  Other Topics Concern  . Not on file  Social History Narrative  . Not on file     Review of Systems: A 12 point ROS discussed and pertinent positives are indicated in the HPI above.  All other systems are negative.  Review of Systems  deferred  Vital Signs: BP (!) 155/90   Pulse 90   Temp 98.1 F (36.7 C) (Oral)   Resp 14   SpO2 97%   Physical Exam  Constitutional: She is oriented to person, place, and time. She appears well-developed and well-nourished.  HENT:  Head: Normocephalic and atraumatic.  Cardiovascular: Normal rate and regular rhythm.  Pulmonary/Chest: Effort normal and breath sounds normal.  Neurological: She is alert and oriented to person, place, and time.    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 EXAM: NUCLEAR MEDICINE PET SKULL BASE TO THIGH TECHNIQUE: 11.9 mCi F-18 FDG was injected intravenously. Full-ring PET imaging was performed from the skull base to thigh after the radiotracer. CT data was obtained and used for attenuation correction and anatomic localization. FASTING BLOOD GLUCOSE:  Value: 105 mg/dl COMPARISON:  Multiple exams, including 01/29/2017 FINDINGS: NECK A focus of accentuated metabolic activity in the vicinity of  the lower margin of the right submandibular gland there is a small focus of accentuated metabolic activity in the right lower neck currently projecting over the subcutaneous tissues adjacent to the lower right submandibular gland and previously in the vicinity of the lower right submandibular gland. Current maximum SUV 10.5, previous 9.7. No definite corresponding lesion on the CT data identified. Significance uncertain; if this represents otherwise occult neoplasm thinning corresponds to Deauville 5 disease. CHEST The previously hypermetabolic right upper lobe nodule measures 1.1 by 0.6 cm (formerly 1.3 by 0.9 cm) and has a maximum SUV of 1.4 (formerly 10.4). This is Deauville 2 disease. The previously hypermetabolic lingular nodule measures about 0.8 cm in diameter (formerly 2.4 by 2.3 cm) but is somewhat obscured by surrounding atelectasis. Maximum SUV 4.0, previously 7.5. This represents Deauville 4 disease. Small left infrahilar lymph nodes are  mildly hypermetabolic, maximum SUV 5.1 (previously maximum SUV in this region was 2.8). Currently compatible with Deauville 4 activity. New 0.8 by 0.5 cm right upper lobe nodule on image 71/3, maximum SUV 1.5. Compatible with Deauville 2 activity. Left thyroid goiter with prominent inferior portion of the left thyroid lobe without significant hypermetabolic activity. Moderate-sized hiatal hernia. Mild cardiomegaly. Right Port-A-Cath tip: Cavoatrial junction. Coronary, aortic arch, and branch vessel atherosclerotic vascular disease. Accentuated metabolic activity in the left subscapularis muscle, likely physiologic. Background mediastinal blood pool activity 2.6. Prior median sternotomy. ABDOMEN/PELVIS Left common iliac node 1.8 cm in diameter on image 189/3 (formerly 0.5 cm), with maximum SUV 17.0 (formerly 1.9). This represents Deauville 5 disease. A 1.3 cm focus of accentuated density in the right paraspinal region on image 165/3 adjacent to the transverse process of  L3 is newly hypermetabolic with a maximum SUV of 15.7 (Deauville 5). Background hepatic activity SUV 3.0. 5.8 by 5.2 cm partially septated cyst of the lateral segment left hepatic lobe. Bilateral adrenal adenomas, larger on the right than the left. Aortoiliac atherosclerotic vascular disease. SKELETON Activity eccentric to the left at the L2-3 level appears to correspond with localized spurring and has a maximum SUV of 6.2. Previously this region had a maximum SUV of 2.3. Prominent degenerative bilateral hip arthropathy. Bridging spurring of the right sacroiliac joint. Prominent thoracic and lumbar spondylosis. Prior median sternotomy. IMPRESSION: 1. Mixed appearance, with marked improvement in the prior 2 pulmonary nodules; but with new Deauville 5 left common iliac lymph node enlargement; new Deauville 5 right paraspinal lesion; and new Deauville 4 left infrahilar nodal activity. 2. Focus of accentuated metabolic activity has been present along the right lower submandibular gland but currently seems to project over the subcutaneous tissues without CT correlate. Current maximum SUV 10.5, previously 9.7. I am uncertain whether this is artifact or simply a finding of lymphoma which remains relatively occult on the CT data. 3. Increased accentuated activity along the left lateral spurring at L2-3. While typically such activity is inflammatory due to spurring, this has increased significantly since the prior exam and merit surveillance. 4. There is a new 8 by 5 mm right upper lobe pulmonary nodule, Deauville 2, which also merits surveillance. 5. Other imaging findings of potential clinical significance: Aortic Atherosclerosis (ICD10-I70.0). Coronary atherosclerosis. Moderate size hiatal hernia. Mild cardiomegaly. Photopenic left hepatic lobe septated cystic lesion. Bilateral adrenal adenomas. Prominent degenerative bilateral hip arthropathy. Thoracic and lumbar spondylosis. Electronically Signed   By: Van Clines M.D.   On: 09/18/2017 15:06    Labs:  CBC: Recent Labs    04/05/17 1028 07/19/17 1059 09/20/17 1031 10/16/17 1306  WBC 8.4 8.7 9.2 8.8  HGB 11.2* 11.0* 11.0* 11.5*  HCT 33.3* 33.3* 33.3* 34.9*  PLT 320 338 317 356    COAGS: Recent Labs    02/19/17 0816 10/16/17 1306  INR 1.01 0.99  APTT 43* 35    BMP: Recent Labs    04/05/17 1028 05/18/17 1116 07/19/17 1059 09/20/17 1031  NA 134* 130* 133* 132*  K 4.0 3.9 4.0 4.1  CL 102 97* 102 101  CO2 24 23 23 24   GLUCOSE 105* 111* 99 117*  BUN 32* 23* 29* 24*  CALCIUM 9.4 8.9 9.0 9.2  CREATININE 1.20* 0.94 1.19* 1.08*  GFRNONAA 44* 58* 44* 49*  GFRAA 51* >60 51* 57*    LIVER FUNCTION TESTS: Recent Labs    12/18/16 1234 07/19/17 1059 09/20/17 1031  BILITOT 1.0 1.0 1.0  AST 27  24 24  ALT 24 17 16   ALKPHOS 84 78 69  PROT 6.8 6.8 6.4*  ALBUMIN 3.6 3.5 3.5    TUMOR MARKERS: No results for input(s): AFPTM, CEA, CA199, CHROMGRNA in the last 8760 hours.  Assessment and Plan:  Right paraspinal mass. Biopsy to follow. Interpreter was present for consent.  Thank you for this interesting consult.  I greatly enjoyed meeting Toni Griffith and look forward to participating in their care.  A copy of this report was sent to the requesting provider on this date.  Electronically Signed: Deandra Goering, ART A, MD 10/16/2017, 1:49 PM   I spent a total of  40 Minutes   in face to face in clinical consultation, greater than 50% of which was counseling/coordinating care for biopsy.

## 2017-10-24 LAB — PATHOLOGY

## 2017-10-24 LAB — SURGICAL PATHOLOGY

## 2017-10-25 ENCOUNTER — Other Ambulatory Visit: Payer: Self-pay | Admitting: Hematology and Oncology

## 2017-10-25 ENCOUNTER — Telehealth: Payer: Self-pay | Admitting: *Deleted

## 2017-10-25 NOTE — Telephone Encounter (Signed)
Toni Griffith called and said that she sees on my chart that biopsy report is back and wants to know when she can get an appointment to go over them. Please advise

## 2017-10-25 NOTE — Telephone Encounter (Signed)
  Please schedule a follow-up appointment.  M

## 2017-10-26 ENCOUNTER — Telehealth: Payer: Self-pay | Admitting: *Deleted

## 2017-10-26 NOTE — Telephone Encounter (Signed)
Patients daughter called requesting results from her mother's testing this week.

## 2017-10-26 NOTE — Telephone Encounter (Signed)
  OK to provide results.  M

## 2017-10-27 ENCOUNTER — Encounter: Payer: Self-pay | Admitting: Urgent Care

## 2017-11-01 ENCOUNTER — Inpatient Hospital Stay: Payer: Medicare HMO | Attending: Hematology and Oncology | Admitting: Hematology and Oncology

## 2017-11-01 ENCOUNTER — Encounter: Payer: Self-pay | Admitting: Hematology and Oncology

## 2017-11-01 ENCOUNTER — Other Ambulatory Visit: Payer: Self-pay

## 2017-11-01 ENCOUNTER — Inpatient Hospital Stay: Payer: Medicare HMO

## 2017-11-01 VITALS — BP 141/88 | HR 85 | Temp 98.5°F | Resp 18 | Wt 191.6 lb

## 2017-11-01 DIAGNOSIS — G620 Drug-induced polyneuropathy: Secondary | ICD-10-CM | POA: Diagnosis not present

## 2017-11-01 DIAGNOSIS — C8518 Unspecified B-cell lymphoma, lymph nodes of multiple sites: Secondary | ICD-10-CM

## 2017-11-01 DIAGNOSIS — Z7189 Other specified counseling: Secondary | ICD-10-CM

## 2017-11-01 DIAGNOSIS — Z923 Personal history of irradiation: Secondary | ICD-10-CM | POA: Diagnosis not present

## 2017-11-01 DIAGNOSIS — E876 Hypokalemia: Secondary | ICD-10-CM | POA: Insufficient documentation

## 2017-11-01 DIAGNOSIS — Z801 Family history of malignant neoplasm of trachea, bronchus and lung: Secondary | ICD-10-CM | POA: Insufficient documentation

## 2017-11-01 DIAGNOSIS — Z5112 Encounter for antineoplastic immunotherapy: Secondary | ICD-10-CM | POA: Diagnosis present

## 2017-11-01 DIAGNOSIS — Z87891 Personal history of nicotine dependence: Secondary | ICD-10-CM

## 2017-11-01 DIAGNOSIS — Z7982 Long term (current) use of aspirin: Secondary | ICD-10-CM | POA: Insufficient documentation

## 2017-11-01 DIAGNOSIS — M25551 Pain in right hip: Secondary | ICD-10-CM | POA: Insufficient documentation

## 2017-11-01 DIAGNOSIS — C7951 Secondary malignant neoplasm of bone: Secondary | ICD-10-CM | POA: Insufficient documentation

## 2017-11-01 DIAGNOSIS — C3432 Malignant neoplasm of lower lobe, left bronchus or lung: Secondary | ICD-10-CM | POA: Insufficient documentation

## 2017-11-01 DIAGNOSIS — Z9221 Personal history of antineoplastic chemotherapy: Secondary | ICD-10-CM

## 2017-11-01 DIAGNOSIS — Z79899 Other long term (current) drug therapy: Secondary | ICD-10-CM | POA: Diagnosis not present

## 2017-11-01 DIAGNOSIS — Z95828 Presence of other vascular implants and grafts: Secondary | ICD-10-CM

## 2017-11-01 MED ORDER — HEPARIN SOD (PORK) LOCK FLUSH 100 UNIT/ML IV SOLN
500.0000 [IU] | INTRAVENOUS | Status: AC | PRN
  Administered 2017-11-01: 500 [IU]

## 2017-11-01 MED ORDER — SODIUM CHLORIDE 0.9% FLUSH
10.0000 mL | INTRAVENOUS | Status: AC | PRN
  Administered 2017-11-01: 10 mL
  Filled 2017-11-01: qty 10

## 2017-11-01 MED ORDER — ONDANSETRON 4 MG PO TBDP: 4.0000 mg | ORAL_TABLET | Freq: Three times a day (TID) | ORAL | 0 refills | Status: DC | PRN

## 2017-11-01 NOTE — Progress Notes (Signed)
Here for follow up. accomp by interpretor ( for deaf ) Carline  and daughter. Pt stated overall " doing pretty good, I feel normal "

## 2017-11-01 NOTE — Progress Notes (Signed)
.[facility] day:  11/01/2017   Chief Complaint: Toni Griffith is a 77 y.o. female with stage IVB gray zone lymphoma who is seen for review of interval CT guided biopsy, consult at [facility], and discussion regarding direction of therapy.  HPI:  The patient was last seen in the medical oncology clinic on 09/20/2017.  At that time, she felt good.  She denied any B symptoms.  Exam revealed a single palpable LEFT axillary lymph node. Sodium was low at 132.  PET scan suggested progression of lymphoma.  CT guided right paraspinal lesion biopsy on 10/16/2017 was compatible with involvement by the patient's previously diagnosed lymphoma.  The case was referred to [facility] hematopathology.  The neoplastic cells were largely CD30 negative.  Repeat CD30 immunohistochemistry faintly stained > 10% of the large atypical cells. CD20 stained the scattered large atypical cells and few small cells.   She was seen for second opinion at [facility] by Dr. Corey Griffith on 10/04/2017.  Treatment with brentuximab vedotin (Adcetris) was recommended.  During the interim, patient is doing well. She notes that she feels "normal". She has no acute complaints today. She denies any adenopathy. 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 Patient is eating well. Her weight remains stable. Patient denies pain in the clinic today.    Past Medical History:  Diagnosis Date  . Arthritis   . Cancer [facility])    lymphoma-right hip  . Deaf   . Hypertension     Past Surgical History:  Procedure Laterality Date  . CARDIAC SURGERY  1993  . CORONARY ARTERY BYPASS GRAFT    . CYST REMOVAL HAND    . PERIPHERAL VASCULAR CATHETERIZATION N/A 04/06/2016   Procedure: Glori Luis Cath Insertion;  Surgeon: Algernon Huxley, MD;  Location: [facility];  Service: Cardiovascular;  Laterality: N/A;    Family History  Problem Relation Age of Onset  . Lung cancer Mother   . Lung cancer Father   . Diabetes  Maternal Uncle   . Myasthenia gravis Maternal Grandmother   . Leukemia Maternal Grandfather     Social History:  reports that she quit smoking about 1 years ago. She has never used smokeless tobacco. She reports that she drinks alcohol. She reports that she does not use drugs.  She smoked 1 1/2 packs per day for 20 years (30 pack year smoking history) until 10/2015.  She is hearing impaired (deaf).  She lives in [place] with her boyfriend.  Her daughter lives in [place].  Her daughter, Toni Griffith's contact number is [phone].  The patient is accompanied Toni Griffith and the interpreter (sign language) today.  She has other family members on FaceTime during the visit.  Allergies: No Known Allergies  Current Medications: Current Outpatient Medications  Medication Sig Dispense Refill  . acetaminophen (TYLENOL) 325 MG tablet Take 650 mg by mouth.    Marland Kitchen allopurinol (ZYLOPRIM) 300 MG tablet TAKE 1 TABLET BY MOUTH EVERY DAY 30 tablet 5  . aspirin 325 MG tablet Take 325 mg by mouth daily.    Marland Kitchen atorvastatin (LIPITOR) 80 MG tablet Take 80 mg by mouth.    . B Complex Vitamins (B COMPLEX 1 PO) Take by mouth.     . Cholecalciferol (VITAMIN D3) 2000 units capsule Take 2,000 Units by mouth daily.    Marland Kitchen FLUoxetine (PROZAC) 40 MG capsule     . gabapentin (NEURONTIN) 100 MG capsule Take 1 capsule (100 mg total) by  mouth 2 (two) times daily. 60 capsule 3  . KLOR-CON 10 10 MEQ tablet TAKE 1 TABLET (10 MEQ TOTAL) BY MOUTH 2 (TWO) TIMES DAILY. 60 tablet 0  . lidocaine-prilocaine (EMLA) cream Apply cream 1 hour before chemotherapy treatment and place small peive of saran wrap over cream to protect clothing 30 g 1  . ondansetron (ZOFRAN ODT) 4 MG disintegrating tablet Take 1 tablet (4 mg total) by mouth every 8 (eight) hours as needed for nausea or vomiting. 20 tablet 0  . pantoprazole (PROTONIX) 40 MG tablet Take 40 mg by mouth.    . triamterene-hydrochlorothiazide (DYAZIDE) 37.5-25 MG capsule Take by mouth.    .  vitamin B-12 (CYANOCOBALAMIN) 500 MCG tablet Take 500 mcg by mouth daily.    . vitamin E 400 UNIT capsule Take by mouth.    . celecoxib (CELEBREX) 200 MG capsule     . naproxen sodium (ANAPROX) 220 MG tablet Take 220 mg by mouth as needed.    . promethazine (PHENERGAN) 25 MG tablet Take 1 tablet (25 mg total) every 6 (six) hours as needed by mouth for nausea or vomiting. (Patient not taking: Reported on 10/16/2017) 10 tablet 0   No current facility-administered medications for this visit.    Facility-Administered Medications Ordered in Other Visits  Medication Dose Route Frequency Provider Last Rate Last Dose  . sodium chloride flush (NS) 0.9 % injection 10 mL  10 mL Intravenous PRN Lequita Asal, MD   10 mL at 04/05/17 1034    Review of Systems:  GENERAL:  Feels "normal".  No fever, chills or sweats.  Weight stable. PERFORMANCE STATUS (ECOG):  2 HEENT:  Deaf.  No visual changes, runny nose, sore throat, mouth sores or tenderness. Lungs: Shortness of breath with exertion.  No cough.  No hemoptysis. Cardiac:  No chest pain, palpitations, orthopnea, or PND.  GI:  Eating well.  No nausea, vomiting, diarrhea, constipation, melena or hematochezia. GU:  No urgency, frequency, dysuria, or hematuria. Musculoskeletal:  Chronic left knee problems.  No back pain.  No muscle tenderness. Extremities:  No pain or swelling. Skin:  No rashes or skin changes. Neuro:  Little numbness in fingertips (stable). No headache, numbness or weakness, balance or coordination issues. Endocrine:  No diabetes, thyroid issues, hot flashes or night sweats. Psych:  No mood changes, depression or anxiety. Pain:  No focal pain. Review of systems:  All other systems reviewed and found to be negative.  Physical Exam:  Blood pressure (!) 141/88, pulse 85, temperature 98.5 F (36.9 C), temperature source Tympanic, resp. rate 18, weight 191 lb 9.6 oz (86.9 kg). GENERAL:  Elderly woman sitting comfortably in the exam  room in no acute distress. She needs assistance onto the table. MENTAL STATUS:  Alert and oriented to person, place and time. HEAD:  Curly gray hair.  Normocephalic, atraumatic, face symmetric, no Cushingoid features. EYES:  Hazel eyes.  Pupils equal round and reactive to light and accomodation.  No conjunctivitis or scleral icterus. ENT:  Oropharynx clear without lesion.  Upper dentures.  Tongue normal. Mucous membranes moist.  RESPIRATORY:  Clear to auscultation without rales, wheezes or rhonchi. CARDIOVASCULAR:  Regular rate and rhythm without murmur, rub or gallop. ABDOMEN:  Ventral hernia.  Soft, non-tender, with active bowel sounds, and no appreciable hepatosplenomegaly.  No masses. SKIN:  No rashes, ulcers or lesions. EXTREMITIES: No edema, no skin discoloration or tenderness.  No palpable cords. LYMPH NODES: Small axillary adenopathy.  No palpable cervical, supraclavicular, or inguinal  adenopathy  NEUROLOGICAL:  Appropriate. PSYCH:  Appropriate.   No visits with results within 3 Day(s) from this visit.  Latest known visit with results is:  Orders Only on 10/16/2017  Component Date Value Ref Range Status  . . 10/16/2017 Comment   Corrected   Comment: Material submitted:                                        Marland Kitchen Paraspinal lesion   . . 10/16/2017 Comment   Corrected   Comment: Clinician provided ICD-10: C85.89 - Oth types of non-hodg lymph  extrnod and solid orga   . . 10/16/2017 Comment   Corrected   Comment: Pre-operative diagnosis:                                        . None provided   . . 10/16/2017 Comment   Corrected   Comment: Post-operative diagnosis:                                       . None provided.   . . 10/16/2017 Comment   Corrected   Comment: Clinical history:                                          . History of "grey zone" lymphoma with good response to mini-RCHOP in 2017, with RT to right iliac wing in 2018. Also had lung squamous carcinoma rx with  SBRT in 2018. Now with new hypermetabolic right paraspinal lesion, left common iliac lymph node.   . . 10/16/2017 Comment   Corrected   Comment: Diagnosis: A. SOFT TISSUE MASS, RIGHT PARASPINAL; CT-GUIDED CORE BIOPSY: - COMPATIBLE WITH INVOLVEMENT BY PATIENT'S PREVIOUSLY DIAGNOSED LYMPHOMA. - SEE COMMENT. Comment: The sample contains fibroadipose tissue and muscle infiltrated by a mixed population of lymphocytes, macrophages, and scattered large cells with folded and sometimes hyperchromatic nuclei. There is no morphologic evidence of metastatic carcinoma. The case was referred to [facility] hematopathology for consultation. Dr. Trudee Kuster reviewed the slides, performed immunohistochemistry [facility]), and signed out the consultation report with the above diagnosis on 10/22/2017. She includes the following comment and IHC results in her report, [facility] accession [id number]: While the overall morphologic and immunophenotypic features are compatible with the patient's reported previously diagnosed "grey zone" lymphoma, the neoplastic cells in                           the current sample are largely CD30 negative. Repeat staining for CD30 to exclude technical artifact is pending and will be reported in an amendment. Special stains performed by [facility] on block A2: CD3: CD3 stains numerous background small cells and is negative in the large atypical cells. CD20: CD20 stains the scattered large atypical cells and few small cells. PAX-5: PAX-5 stains similarly to CD20, with generally strong staining in the large atypical cells. CD30: CD30 faintly stains few scattered larger cells. REPEAT STAIN is pending. EBV(ISH): Negative, with appropriate controls. Oct 2: Oct 2 positive in the large atypical cells and few scattered smaller cells. Bob-1: Bob-1  is faintly positive in the large atypical cells. AE1/AE3: Cytokeratin stain AE1/AE3 is negative for focal non-specific staining The  preliminary findings were discussed with [name] on 10/17/2017 and she was in agreement with the request for consultation. Flow cytometry was performed by [facility] [employer] [phone]), and by report there is no immunophenotypic evidence of a monoclonal B cell population detected; analysis was limited due to low number of B cells. MSO/10/24/2017   . . 10/16/2017 Comment:   Corrected   Comment: CASE: [id number] PATIENT: Toni Griffith Final Diagnosis performed by Bryan Lemma, MD.  Electronically signed 10/22/2017 2:50:45PM The electronic signature indicates that the named Attending Pathologist has evaluated the specimen Technical component performed at [facility], [address] [place], [place] [address] Lab: [phone] Dir: Rush Farmer, MD, MMM Professional component performed at [facility], [facility], [place], [place], [place] [address] Lab: [phone] Dir: Dellia Nims. Reuel Derby, MD ADDENDUM #1: Dr. Rodena Piety reviewed the repeat CD30 immunohistochemical stain and provided the following amended comment: The overall morphologic and immunophenotypic features are compatible with the patient's reported previously diagnosed "grey zone" lymphoma. CD30 faintly stains greater than 10% of the neoplastic cells. Addendum #1 performed by Bryan Lemma, MD.  Electronically signed 10/24/2017 12:06:29PM Technical component performed at [facility], [address], [place], [place] [address] Lab: [phone] Dir: Rush Farmer, MD, MMM Professional component performed at [facility], [facility], [place], [place], [place] [address] Lab: [phone] Dir: Dellia Nims. Rubinas, MD   . . 10/16/2017 Comment   Final   Comment: Electronically signed:                                     Marland Kitchen Vivia Ewing, MD, Pathologist   . . 10/16/2017 Comment   Corrected    Comment: Gross description:                                         . A. Labeled: Paraspinal lesion Tissue fragment(s): 3 Size: 0.6-0.8 cm in length and in diameter 0.1 Description: received in saline, 3 cores, touch prep prepared with Diff Quik stain, following a representative is submitted in RPMI media for flow cytometry, remaining tissue is formalin fixed wrapped in lens paper and submitted in a mesh bag Entirely submitted in 1-2 cassette(s). /MSO   . . 10/16/2017 Comment   Corrected   Comment: Pathologist provided ICD-10: C85.89   . . 10/16/2017 Comment   Corrected   Comment: CPT                                                        . [id number]     Radiology studies: 02/20/2016:  Chest, abdomen and pelvic CT revealed a 3.7 cm subpleural mass within the left lower lobe, a 1.1 cm right lower lobe subpleural  nodule , 9 mm RUL nodule, and 0.4 cm lingular nodule.  Between the left second, third, and fourth ribs there was 1.9 cm soft tissue mass. There was a 2.5 cm low-attenuation nodule in the left lobe of the thyroid.   There were prominent left axillary nodes (up to 1.0 cm).   There was no definite mediastinal adenopathy. There was a 5.5 x 4.6 cm cystic lesion in the left hepatic lobe.  There was a 3.1 cm nodule in the right adrenal gland. There was a 1.2 cm right retrocaval lymph node with additional prominent subcentimeter retroperitoneal nodes. There was 1.0 cm left common iliac node. There was a 6.2 x 5.2 cm soft tissue mass within the right ilium.  02/21/2016:  Head MRI revealed no evidence of malignancy. Thyroid ultrasound on 02/21/2016 revealed a 2.4 cm solid nodule in the inferior aspect of the left lobe 03/03/2016:  PET scan revealed a hypermetabolic left lower lobe mass, bilateral pleural and pulmonary parenchymal nodular metastasis, a large pleural lesion invading the anterior left chest wall.   There was activity in the superior left ocular orbit.  There was a large lesion  centered in the right iliac wing with soft tissue extension.  There were hypermetabolic right external iliac lymph nodes 06/06/2016:  PET scan revealed significant interval response to therapy.  There was significant decrease in size and FDG uptake associated with left lower lobe lung lesion.  There was resolution of left-sided pleural base metastasis and left axillary and anterior mediastinal nodal metastasis.  There was resolution of previous hypermetabolic soft tissue metastasis/nodal metastasis posterior to the IVC.  There was marked improvement in hypermetabolic metastasis involving the right iliac wing.  There was persistent right upper lobe lung nodule which exhibits intense radiotracer uptake which may represent a focus of metastatic disease or synchronous primary lung neoplasm. 06/01/2017:  PET scan revealed no new or progressive findings.  The 9 mm RUL pulmonary nodule remained hypermetabolic (SUV 85.6, previously 7.3) and no change in size.  The index lesion in the posterior left lower lobe was minimally smaller on CT imaging with very low level FDG uptake (SUV 1.6).  There was no change in the appearance at site of previous disease at the right iliac wing (SUV 1.9 compared to 2.6 previously). 01/29/2017:  PET scan revealed interval progression of hypermetabolic nodules in the right upper (13 mm compared to 9 mm; SUV 10) and left lower lobes (26 mm compared to 22 mm; SUV 7.5).  There was new hypermetabolic focus of activity along the anterior left pleura although no underlying pleural or lung mass was evident. 09/18/2017:  PET scan revealed a mixed appearance, with marked improvement in the prior 2 pulmonary nodules; but with new Deauville 5 left common iliac lymph node enlargement; new Deauville 5 right paraspinal lesion; and new Deauville 4 left infrahilar nodal activity.  There was a focus of accentuated metabolic activity which had been present along the right lower submandibular gland but  currently seemed to project over the subcutaneous tissues without CT correlate. Current maximum SUV 10.5, previously 9.7.  There was increased accentuated activity along the left lateral spurring at L2-3. While typically such activity is inflammatory due to spurring, this had increased significantly since the prior exam and merit surveillance. There was a new 8 by 5 mm right upper lobe pulmonary nodule, Deauville 2, which also merits surveillance.   Assessment:  Safa Derner is a 77 y.o. female with stage IVB B-cell lymphoma, unclassifiable, with features intermediate  between diffuse large B-cell lymphoma and classical Hodgkin's lymphoma ("gray zone" lymphoma).  She underwent right iliac wing bone/bone marrow biopsy on 03/08/2016.  Head MRI on 02/21/2016 revealed no evidence of malignancy. 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   There was activity in the superior left ocular orbit.  There was a large lesion centered in the right iliac wing with soft tissue extension.  There were hypermetabolic right external iliac lymph nodes  She was admitted to [facility] in [facility].  She had renal failure and hypercalcemia. She was treated with IVF, calcitonin and Zometa.The patient has a history of coronary artery disease s/p CABG in 1993.  She has no history of heart failure.  She is deaf and requires an interpreter.  Echo on 04/04/2016 revealed an EF of 55-60%.  Hepatitis B and C testing was negative on 03/30/2016.   G6PD assay was normal.  She declined LP with MTX prophylaxis.  She received 6 cycles of mini-RCHOP (04/07/2016 - 07/21/2016).  She has had some transient fingertip numbness.  She received radiation to the right iliac wing,  from  10/11/2016 - 11/01/2016.  She received 3000 cGy over 3 weeks.  PET scan on 01/29/2017 revealed interval progression of hypermetabolic nodules in the right upper (13 mm compared to 9 mm; SUV 10) and left lower lobes (26 mm compared to 22 mm; SUV 7.5).  There was new hypermetabolic focus of activity along the anterior left pleura although no underlying pleural or lung mass was evident.  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  She underwent SBRT 03/26/2017 - 04/09/2017.  She received 5000 cGy in 5 fractions to the left lower lobe lesion.  She underwent SBRT to a RUL lesion from 05/23/2017 - 06/18/2017.  PET scan on 09/18/2017 revealed a mixed appearance, with marked improvement in the prior 2 pulmonary nodules; but with new Deauville 5 left common iliac lymph node enlargement; new Deauville 5 right paraspinal lesion; and new Deauville 4 left infrahilar nodal activity.  There was a focus of accentuated metabolic activity which had been present along the right lower submandibular gland but currently seemed to project over the subcutaneous tissues without CT correlate. Current maximum SUV 10.5, previously 9.7. There was increased accentuated activity along the left lateral spurring at L2-3. While typically such activity is inflammatory due to spurring, this had increased significantly since the prior exam and merit surveillance. There was a new 8 by 5 mm right upper lobe pulmonary nodule, Deauville 2.  CT guided right paraspinal lesion biopsy on 10/16/2017 was compatible with involvement by the patient's previously diagnosed lymphoma.  The case was referred to [facility] hematopathology.  The neoplastic cells were largely CD30 negative.  Repeat CD30 immunohistochemistry faintly stained > 10% of the large atypical cells. CD20 stained the scattered large atypical cells and few small cells.   She has a normocytic anemia.  Labs on 04/17/2016 revealed an elevated ferritin  (444), iron saturation (11%), low TIBC (240), B12 (2409), and folate (10.8).  Symptomatically, she feels good.  She denies any B symptoms.  Exam reveals a single palpable LEFT axillary lymph node.   Plan: 1.  Labs today: HBV cAb, HBV sAg, HCV Ab, JC virus.  2.  Discuss interval parapspinal biopsy- lymphoma. 3.  Discuss [facility] consult with Dr. Corey Griffith.  Discussed recommendation of brentuximab vedotin.  No linear correlation with expression of CD30 and response to treatment. 4.  Discuss treatment using brentuximab vedotin (Adcetris) once a week every 3 weeks for 2-3 cycles, followed by re-imaging studies to determine effectiveness. If medication not demonstrating promising effects, alternate options will need to be considered. Discuss medication side effects. Written information provided today on patient's AVS for review at home.  5.  Pending approval for chemotherapy regimen. Will call patient for RTC once approved.    Honor Loh, NP  11/01/2017, 3:15 PM   I saw and evaluated the patient, participating in the key portions of the service and reviewing pertinent diagnostic studies and records.  I reviewed the nurse practitioner's note and agree with the findings and the plan.  The assessment and plan were discussed with the patient.  Multiple questions were asked and answered.   Lequita Asal, MD  11/01/2017, 3:15 PM

## 2017-11-01 NOTE — Patient Instructions (Signed)
Brentuximab vedotin solution for injection What is this medicine? BRENTUXIMAB VEDOTIN (bren TUX see mab ve DOE tin) is a monoclonal antibody and a chemotherapy drug. It is used for treating Hodgkin lymphoma and certain non-Hodgkin lymphomas, such as anaplastic large-cell lymphoma and mycosis fungoides. This medicine may be used for other purposes; ask your health care provider or pharmacist if you have questions. COMMON BRAND NAME(S): ADCETRIS What should I tell my health care provider before I take this medicine? They need to know if you have any of these conditions: -immune system problems -infection (especially a virus infection such as chickenpox, cold sores, or herpes) -kidney disease -liver disease -low blood counts, like low white cell, platelet, or red cell counts -tingling of the fingers or toes, or other nerve disorder -an unusual or allergic reaction to brentuximab vedotin, other medicines, foods, dyes, or preservatives -pregnant or trying to get pregnant -breast-feeding How should I use this medicine? This medicine is for infusion into a vein. It is given by a health care professional in a hospital or clinic setting. Talk to your pediatrician regarding the use of this medicine in children. Special care may be needed. Overdosage: If you think you have taken too much of this medicine contact a poison control center or emergency room at once. NOTE: This medicine is only for you. Do not share this medicine with others. What if I miss a dose? It is important not to miss your dose. Call your doctor or health care professional if you are unable to keep an appointment. What may interact with this medicine? This medicine may interact with the following medications: -ketoconazole -rifampin -St. John's wort; Hypericum perforatum This list may not describe all possible interactions. Give your health care provider a list of all the medicines, herbs, non-prescription drugs, or dietary  supplements you use. Also tell them if you smoke, drink alcohol, or use illegal drugs. Some items may interact with your medicine. What should I watch for while using this medicine? Visit your doctor for checks on your progress. This drug may make you feel generally unwell. Report any side effects. Continue your course of treatment even though you feel ill unless your doctor tells you to stop. Call your doctor or health care professional for advice if you get a fever, chills or sore throat, or other symptoms of a cold or flu. Do not treat yourself. This drug decreases your body's ability to fight infections. Try to avoid being around people who are sick. This medicine may increase your risk to bruise or bleed. Call your doctor or health care professional if you notice any unusual bleeding. In some patients, this medicine may cause a serious brain infection that may cause death. If you have any problems seeing, thinking, speaking, walking, or standing, tell your doctor right away. If you cannot reach your doctor, urgently seek other source of medical care. Do not become pregnant while taking this medicine or for 6 months after stopping it. Women should inform their doctor if they wish to become pregnant or think they might be pregnant. Men should not father a child while taking this medicine and for 6 months after stopping it. There is a potential for serious side effects to an unborn child. Talk to your health care professional or pharmacist for more information. Do not breast-feed an infant while taking this medicine. This may interfere with the ability to father a child. You should talk to your doctor or health care professional if you are concerned about your  fertility. What side effects may I notice from receiving this medicine? Side effects that you should report to your doctor or health care professional as soon as possible: -allergic reactions like skin rash, itching or hives, swelling of the face,  lips, or tongue -changes in emotions or moods -diarrhea -low blood counts - this medicine may decrease the number of white blood cells, red blood cells and platelets. You may be at increased risk for infections and bleeding. -pain, tingling, numbness in the hands or feet -redness, blistering, peeling or loosening of the skin, including inside the mouth -shortness of breath -signs of infection - fever or chills, cough, sore throat, pain or difficulty passing urine -signs of decreased platelets or bleeding - bruising, pinpoint red spots on the skin, black, tarry stools, blood in the urine -signs of decreased red blood cells - unusually weak or tired, fainting spells, lightheadedness -signs of liver injury like dark yellow or brown urine; general ill feeling or flu-like symptoms; light-colored stools; loss of appetite; nausea; right upper belly pain; yellowing of the eyes or skin -stomach pain -sudden numbness or weakness of the face, arm or leg -vomiting Side effects that usually do not require medical attention (report to your doctor or health care professional if they continue or are bothersome): -dizziness -headache -muscle pain -tiredness This list may not describe all possible side effects. Call your doctor for medical advice about side effects. You may report side effects to FDA at 1-800-FDA-1088. Where should I keep my medicine? This drug is given in a hospital or clinic and will not be stored at home. NOTE: This sheet is a summary. It may not cover all possible information. If you have questions about this medicine, talk to your doctor, pharmacist, or health care provider.  2018 [employer] (2016-06-09 18:06:58)

## 2017-11-01 NOTE — Progress Notes (Signed)
START OFF [facility] - Lymphoma and CLL   [id number]:Brentuximab vedotin:   A cycle is every 3 weeks:     Brentuximab vedotin   **Always confirm dose/schedule in your pharmacy ordering system**    Patient Characteristics: Disease Type: Not Applicable Disease Type: Not Applicable Disease Type: Not Applicable Intent of Therapy: Non-Curative / Palliative Intent, Discussed with Patient

## 2017-11-02 LAB — HEPATITIS B CORE ANTIBODY, TOTAL: HEP B C TOTAL AB: NEGATIVE

## 2017-11-02 LAB — HEPATITIS C ANTIBODY: HCV Ab: <0.1 s/co ratio (ref 0.0–0.9)

## 2017-11-02 LAB — HEPATITIS B SURFACE ANTIGEN: HEP B S AG: NEGATIVE

## 2017-11-05 ENCOUNTER — Encounter: Payer: Self-pay | Admitting: Urgent Care

## 2017-11-06 ENCOUNTER — Telehealth: Payer: Self-pay | Admitting: *Deleted

## 2017-11-06 NOTE — Telephone Encounter (Signed)
Per Gaspar Bidding 11/06/17 staff message,  Port labs (leave accessed)/NP/ *NEW* Brentuximab Vedotin to start on 11/16/17. Spoke with patient's daughter and made her aware of the appt. Date and Time. She stated date and time was prefect. Also a reminder letter will be mailed out.

## 2017-11-09 IMAGING — DX DG CHEST 1V PORT
1 series · 1 of 1 positions shown · non-contrast
Comparison: CT 02/19/2017 CT

CLINICAL DATA: Post left lung biopsy

EXAM:
PORTABLE CHEST 1 VIEW

[chest ap]
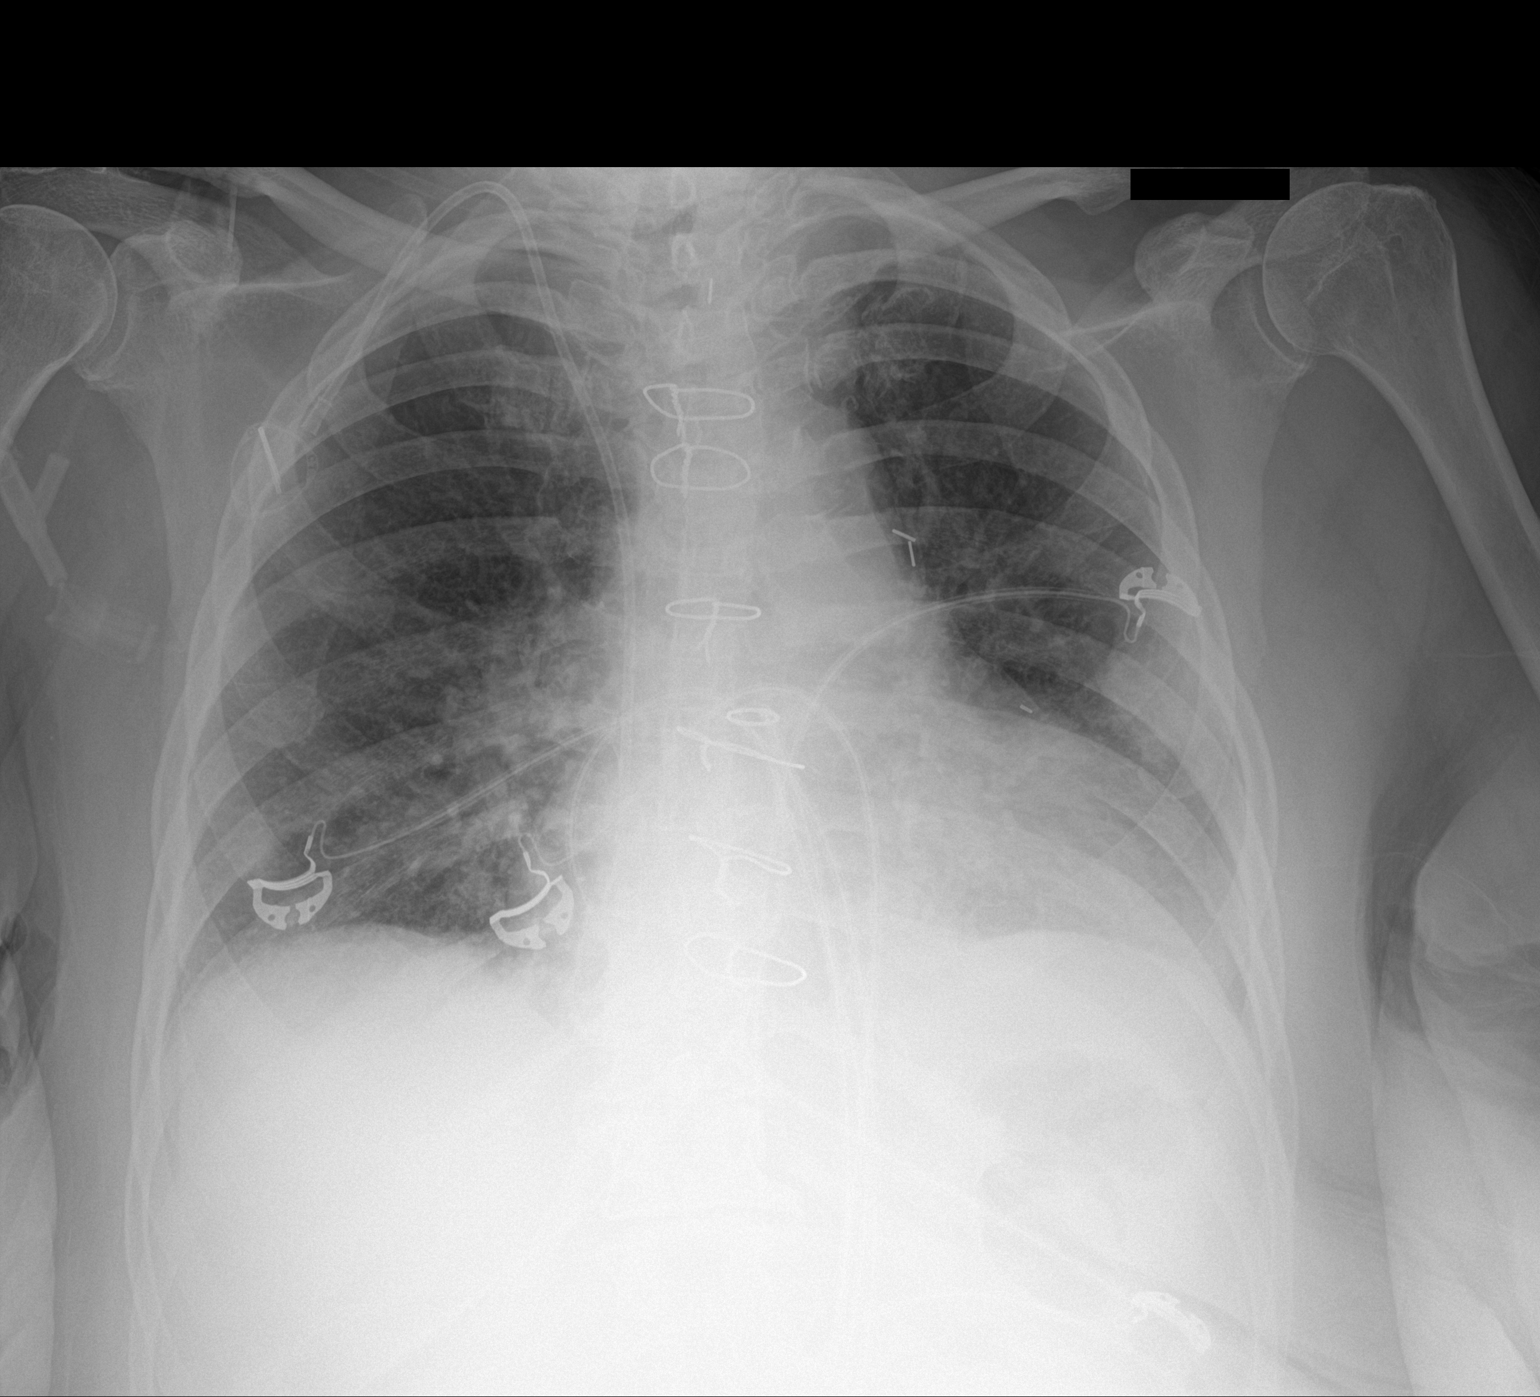

[1 of 1 positions shown; findings below may reference images not displayed]

FINDINGS: Peripheral left lung masses noted as seen on prior imaging. No
pneumothorax following biopsy. Port-A-Cath is in place with the tip
in the upper right atrium. Mild vascular congestion. Prior CABG.
IMPRESSION: No pneumothorax following left lung biopsy. Peripheral left lung
mass/ nodule again noted.

Vascular congestion.

## 2017-11-09 IMAGING — CT CT BIOPSY
2 of 3 series · 8 of 14 positions shown, 10 images · non-contrast
Comparison: none

CLINICAL DATA: History of lymphoma with enlarging right upper lobe
and left lower lobe pulmonary nodules demonstrating increased
metabolic activity by PET scan. The patient presents for biopsy of
the left lower lobe nodule.

[Series 2: i-spiral 3.0 b30f · axial · 0.77mm/px · z∈[-521,-422]mm · 5 of 51 slices shown, 7 images]
[im 9/51  soft-tissue]
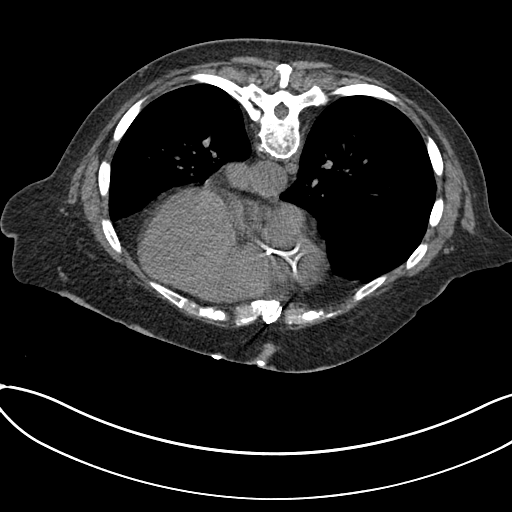
[im 9/51  bone]
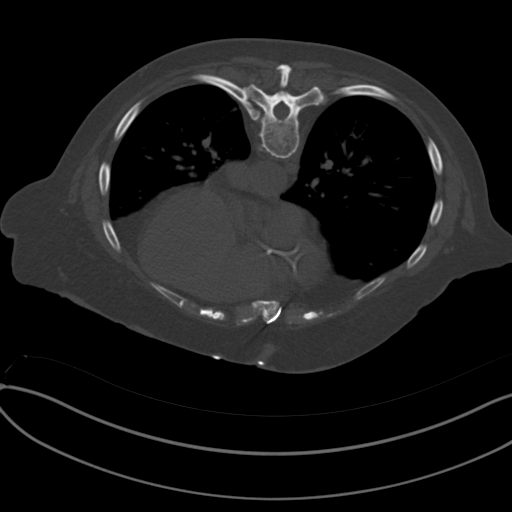
[im 17/51  bone]
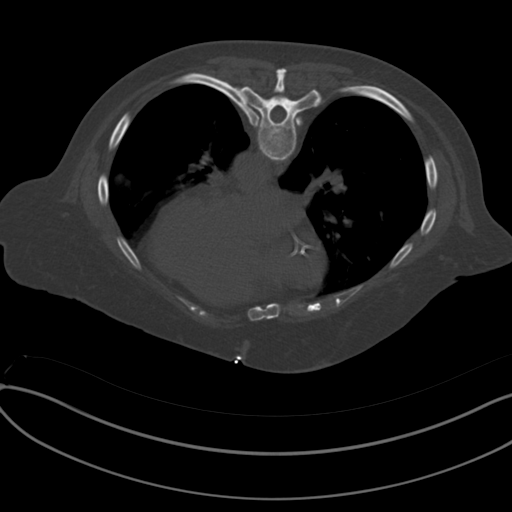
[im 26/51  bone]
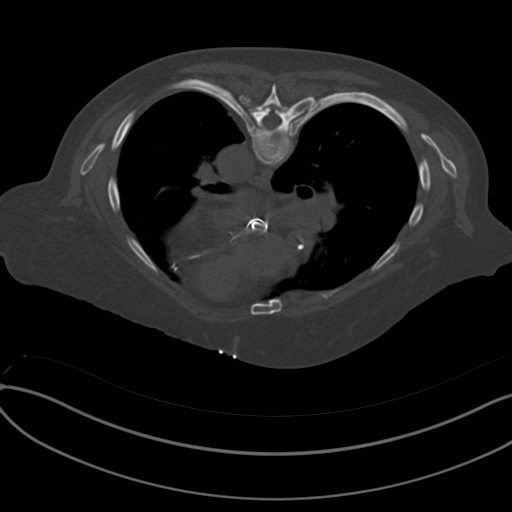
[im 34/51  bone]
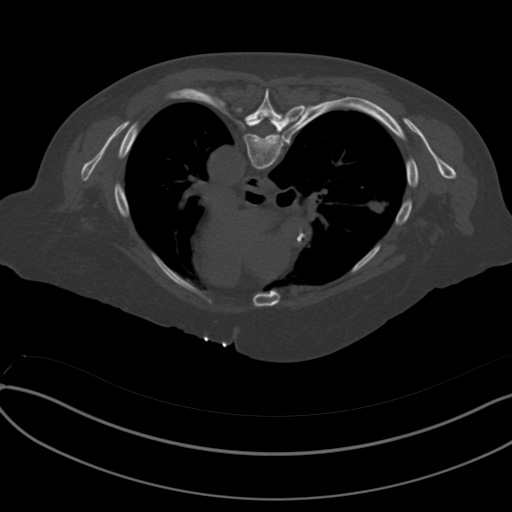
[im 42/51  soft-tissue]
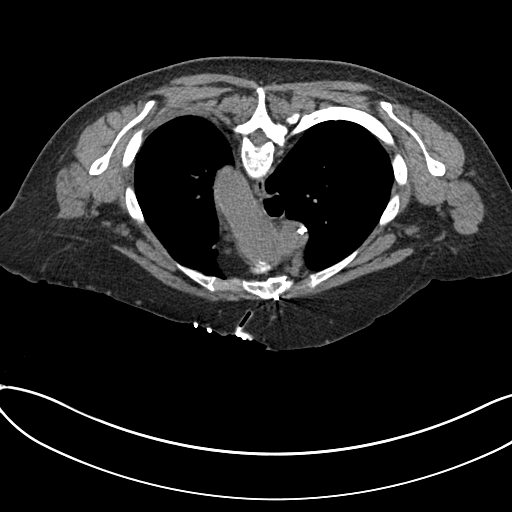
[im 42/51  bone]
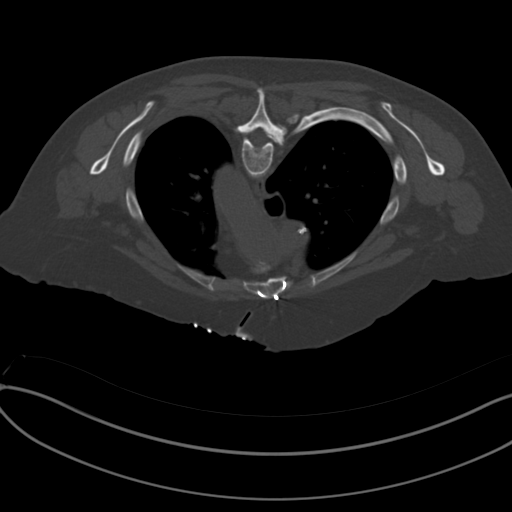

[Series 3: i-sequence 4.8 b30s · axial · 0.77mm/px · z∈[-489,-482]mm · 3 of 42 slices shown]
[im 11/42  bone]
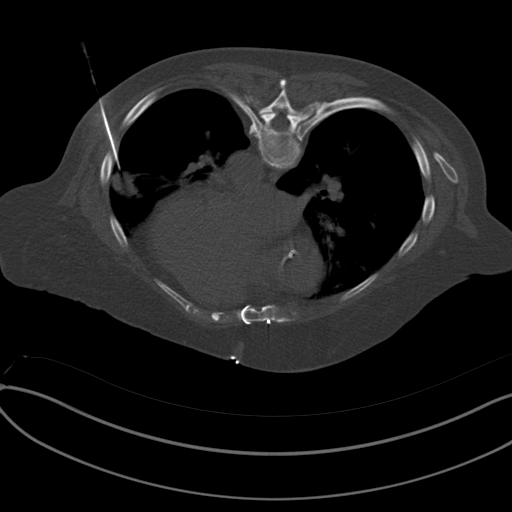
[im 21/42  bone]
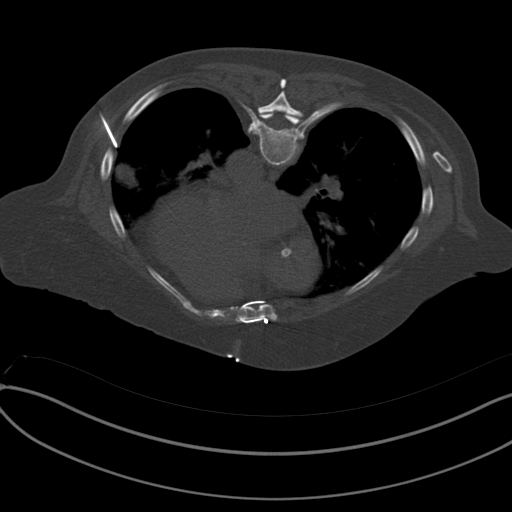
[im 31/42  bone]
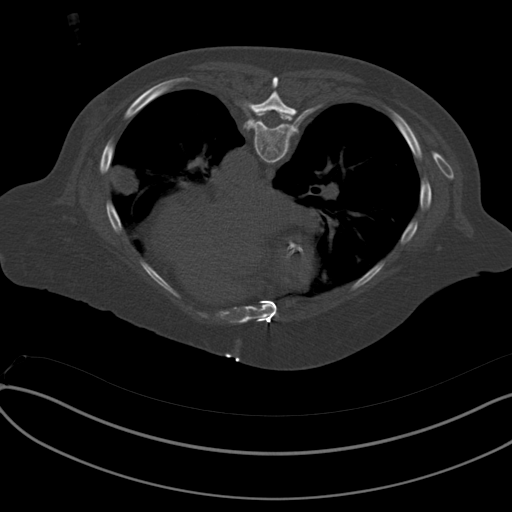

[8 of 14 positions shown; findings below may reference images not displayed]

EXAM:
CT GUIDED CORE BIOPSY OF LEFT LUNG NODULE

ANESTHESIA/SEDATION:
2.0 mg IV Versed; 75 mcg IV Fentanyl

Total Moderate Sedation Time:  26 minutes.

The patient's level of consciousness and physiologic status were
continuously monitored during the procedure by Radiology nursing.

PROCEDURE:
The procedure risks, benefits, and alternatives were explained to
the patient. Questions regarding the procedure were encouraged and
answered. The patient understands and consents to the procedure. A
time-out was performed prior to initiating the procedure.

The left posterior chest wall was prepped with chlorhexidine in a
sterile fashion, and a sterile drape was applied covering the
operative field. A sterile gown and sterile gloves were used for the
procedure. Local anesthesia was provided with 1% Lidocaine.

CT was performed in a prone position through the chest. Under CT
guidance, a 17 gauge needle was advanced into the posterolateral
aspect of the left lower lobe at the level of a pulmonary nodule.
After confirming needle tip position, a total of 3 separate core
biopsy samples were obtained utilizing an 18 gauge core biopsy
device. Two samples were submitted in formalin and a third sample
placed on Telfa soaked with saline for additional flow cytometry,
needed.

On completion of the procedure, the Biosentry device was utilized in
depositing a plug at the pleural entry site.

COMPLICATIONS:
None
FINDINGS: The 2.6 cm peripheral left lower lobe lung nodule was localized.
Solid tissue was obtained with core biopsy. Post biopsy imaging
shows no evidence of pneumothorax or pulmonary hemorrhage.
IMPRESSION: CT-guided core biopsy performed of a 2.6 cm left lower lobe lung
nodule.

## 2017-11-14 ENCOUNTER — Other Ambulatory Visit: Payer: Self-pay | Admitting: Hematology and Oncology

## 2017-11-14 ENCOUNTER — Other Ambulatory Visit: Payer: Self-pay | Admitting: *Deleted

## 2017-11-14 DIAGNOSIS — E876 Hypokalemia: Secondary | ICD-10-CM

## 2017-11-14 DIAGNOSIS — C8198 Hodgkin lymphoma, unspecified, lymph nodes of multiple sites: Secondary | ICD-10-CM

## 2017-11-14 DIAGNOSIS — R911 Solitary pulmonary nodule: Secondary | ICD-10-CM

## 2017-11-14 DIAGNOSIS — C8330 Diffuse large B-cell lymphoma, unspecified site: Secondary | ICD-10-CM

## 2017-11-14 DIAGNOSIS — C8580 Other specified types of non-Hodgkin lymphoma, unspecified site: Principal | ICD-10-CM

## 2017-11-14 DIAGNOSIS — C7951 Secondary malignant neoplasm of bone: Secondary | ICD-10-CM

## 2017-11-14 MED ORDER — GABAPENTIN 100 MG PO CAPS: 100.0000 mg | ORAL_CAPSULE | Freq: Two times a day (BID) | ORAL | 3 refills | Status: DC

## 2017-11-16 ENCOUNTER — Inpatient Hospital Stay: Payer: Medicare HMO | Admitting: Urgent Care

## 2017-11-16 ENCOUNTER — Other Ambulatory Visit: Payer: Self-pay | Admitting: Urgent Care

## 2017-11-16 ENCOUNTER — Other Ambulatory Visit: Payer: Self-pay | Admitting: Hematology and Oncology

## 2017-11-16 ENCOUNTER — Inpatient Hospital Stay: Payer: Medicare HMO | Admitting: *Deleted

## 2017-11-16 ENCOUNTER — Inpatient Hospital Stay: Payer: Medicare HMO

## 2017-11-16 DIAGNOSIS — C8518 Unspecified B-cell lymphoma, lymph nodes of multiple sites: Secondary | ICD-10-CM | POA: Diagnosis not present

## 2017-11-16 DIAGNOSIS — Z5111 Encounter for antineoplastic chemotherapy: Secondary | ICD-10-CM

## 2017-11-16 LAB — CBC WITH DIFFERENTIAL/PLATELET
BASOS ABS: 0.1 10*3/uL (ref 0–0.1)
BASOS PCT: 1 %
EOS ABS: 0.6 10*3/uL (ref 0–0.7)
EOS PCT: 8 %
HCT: 35.9 % (ref 35.0–47.0)
Hemoglobin: 11.8 g/dL — ABNORMAL LOW (ref 12.0–16.0)
LYMPHS PCT: 19 %
Lymphs Abs: 1.5 10*3/uL (ref 1.0–3.6)
MCH: 28.5 pg (ref 26.0–34.0)
MCHC: 32.8 g/dL (ref 32.0–36.0)
MCV: 87.1 fL (ref 80.0–100.0)
MONO ABS: 0.9 10*3/uL (ref 0.2–0.9)
Monocytes Relative: 11 %
Neutro Abs: 4.9 10*3/uL (ref 1.4–6.5)
Neutrophils Relative %: 61 %
PLATELETS: 406 10*3/uL (ref 150–440)
RBC: 4.12 MIL/uL (ref 3.80–5.20)
RDW: 17.7 % — AB (ref 11.5–14.5)
WBC: 7.9 10*3/uL (ref 3.6–11.0)

## 2017-11-16 LAB — COMPREHENSIVE METABOLIC PANEL
ALK PHOS: 86 U/L (ref 38–126)
ALT: 21 U/L (ref 14–54)
AST: 25 U/L (ref 15–41)
Albumin: 3.6 g/dL (ref 3.5–5.0)
Anion gap: 9 (ref 5–15)
BILIRUBIN TOTAL: 1.3 mg/dL — AB (ref 0.3–1.2)
BUN: 29 mg/dL — AB (ref 6–20)
CALCIUM: 9.3 mg/dL (ref 8.9–10.3)
CO2: 21 mmol/L — ABNORMAL LOW (ref 22–32)
Chloride: 101 mmol/L (ref 101–111)
Creatinine, Ser: 1.27 mg/dL — ABNORMAL HIGH (ref 0.44–1.00)
GFR calc Af Amer: 47 mL/min — ABNORMAL LOW (ref >60)
GFR calc non Af Amer: 41 mL/min — ABNORMAL LOW (ref >60)
Glucose, Bld: 114 mg/dL — ABNORMAL HIGH (ref 65–99)
POTASSIUM: 3.9 mmol/L (ref 3.5–5.1)
Sodium: 131 mmol/L — ABNORMAL LOW (ref 135–145)
TOTAL PROTEIN: 6.8 g/dL (ref 6.5–8.1)

## 2017-11-16 LAB — MAGNESIUM: Magnesium: 1.6 mg/dL — ABNORMAL LOW (ref 1.7–2.4)

## 2017-11-16 MED ORDER — HEPARIN SOD (PORK) LOCK FLUSH 100 UNIT/ML IV SOLN
INTRAVENOUS | Status: AC
  Filled 2017-11-16: qty 5

## 2017-11-16 NOTE — Progress Notes (Signed)
This encounter was created in error - please disregard.

## 2017-11-20 ENCOUNTER — Encounter: Payer: Self-pay | Admitting: Urgent Care

## 2017-11-20 LAB — MISC LABCORP TEST (SEND OUT)
LabCorp test name: 139370
Labcorp test code: 139370

## 2017-11-20 NOTE — Progress Notes (Signed)
[facility] day:  11/23/2017   Chief Complaint: Toni Griffith is a 77 y.o. female with stage IVB gray zone lymphoma who is seen for ssessment prior to cycle #1 brentuximab vedotin (Adcetris).  HPI:  The patient was last seen in the medical oncology clinic on 11/01/2017.  At that time, patient was doing well.  She noted that she felt "normal".  Patient denied acute complaints.  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 Exam reveals a single palpable LEFT axillary lymph node.  Discussed treatment using Adcetris.  Additional lab testing was ordered.  Patient had additional lab testing done on 11/01/2017 and preparations to begin Adcetris chemotherapy.  Hepatitis B and C serologies were found to be negative.  JC virus testing was inadvertently not sent. Blood sample recollected and sent for testing on 11/16/2017. Initiation of treatment was postponed.   JC virus testing resulted as negative on 11/20/2017. Patient was contacted to make her aware that we would be able to proceed with treatment as planned this week.   In the interim, patient has had some interval diarrhea. She notes that her diarrhea has resolved. She has had no fevers. Patient notes that she "thinks that it is her nerves". Patient has pain in her bilateral lower extremities, mostly in the thigh areas. Patient fatigues easily with ambulation. She denies recent falls. Patient denies numbness. Patient takes APAP on a PRN basis, with "sort of helps". Patient only takes 1-2 APAP tabs at bedtime citing that she "does not want to become addicted".   Patient denies B symptoms. She has no interval infections. Patient has some neuropathy in her hands (L>R) following previous chemotherapy. It is mainly in the tips of her fingers (first and second digit).  Neuropathy affects ability to sew and open jars. She is unable to comment on whether or she has issues with buttons citing that she does not wear button up  clothing. Patient is eating "ok".  Her weight is down 6 pound since her last clinic visit.    Past Medical History:  Diagnosis Date  . Arthritis   . Cancer [facility])    lymphoma-right hip  . Deaf   . Hypertension     Past Surgical History:  Procedure Laterality Date  . CARDIAC SURGERY  1993  . CORONARY ARTERY BYPASS GRAFT    . CYST REMOVAL HAND    . PERIPHERAL VASCULAR CATHETERIZATION N/A 04/06/2016   Procedure: Glori Luis Cath Insertion;  Surgeon: Algernon Huxley, MD;  Location: [facility];  Service: Cardiovascular;  Laterality: N/A;    Family History  Problem Relation Age of Onset  . Lung cancer Mother   . Lung cancer Father   . Diabetes Maternal Uncle   . Myasthenia gravis Maternal Grandmother   . Leukemia Maternal Grandfather     Social History:  reports that she quit smoking about 2 years ago. She has never used smokeless tobacco. She reports that she drinks alcohol. She reports that she does not use drugs.  She smoked 1 1/2 packs per day for 20 years (30 pack year smoking history) until 10/2015.  She is hearing impaired (deaf).  She lives in [place] with her boyfriend.  Her daughter lives in [place].  Her daughter, [name]'s contact number is [phone].  The patient is accompanied [name] and Rusty (sign language interpreter [phone]) today.   Allergies: No Known Allergies  Current Medications: Current Outpatient Medications  Medication Sig Dispense Refill  .  acetaminophen (TYLENOL) 325 MG tablet Take 650 mg by mouth.    Marland Kitchen allopurinol (ZYLOPRIM) 300 MG tablet TAKE 1 TABLET BY MOUTH EVERY DAY 30 tablet 3  . aspirin 325 MG tablet Take 325 mg by mouth daily.    . B Complex Vitamins (B COMPLEX 1 PO) Take by mouth.     . celecoxib (CELEBREX) 200 MG capsule     . Cholecalciferol (VITAMIN D3) 2000 units capsule Take 2,000 Units by mouth daily.    Marland Kitchen FLUoxetine (PROZAC) 40 MG capsule     . gabapentin (NEURONTIN) 100 MG capsule Take 1 capsule (100 mg total) by mouth 2 (two) times  daily. 60 capsule 3  . KLOR-CON 10 10 MEQ tablet TAKE 1 TABLET (10 MEQ TOTAL) BY MOUTH 2 (TWO) TIMES DAILY. 60 tablet 0  . lidocaine-prilocaine (EMLA) cream Apply cream 1 hour before chemotherapy treatment and place small peive of saran wrap over cream to protect clothing 30 g 1  . naproxen sodium (ANAPROX) 220 MG tablet Take 220 mg by mouth as needed.    . ondansetron (ZOFRAN ODT) 4 MG disintegrating tablet Take 1 tablet (4 mg total) by mouth every 8 (eight) hours as needed for nausea or vomiting. 20 tablet 0  . pantoprazole (PROTONIX) 40 MG tablet Take 40 mg by mouth.    . vitamin B-12 (CYANOCOBALAMIN) 500 MCG tablet Take 500 mcg by mouth daily.    . vitamin E 400 UNIT capsule Take by mouth.    Marland Kitchen atorvastatin (LIPITOR) 80 MG tablet Take 80 mg by mouth.    . promethazine (PHENERGAN) 25 MG tablet Take 1 tablet (25 mg total) every 6 (six) hours as needed by mouth for nausea or vomiting. (Patient not taking: Reported on 10/16/2017) 10 tablet 0  . triamterene-hydrochlorothiazide (DYAZIDE) 37.5-25 MG capsule Take by mouth.     No current facility-administered medications for this visit.    Facility-Administered Medications Ordered in Other Visits  Medication Dose Route Frequency Provider Last Rate Last Dose  . sodium chloride flush (NS) 0.9 % injection 10 mL  10 mL Intravenous PRN Lequita Asal, MD   10 mL at 04/05/17 1034    Review of Systems:  GENERAL:  Feels fine.  No fevers, sweats or weight loss.  Weight down 6 pounds. PERFORMANCE STATUS (ECOG):  2 HEENT:  Deaf.  No visual changes, runny nose, sore throat, mouth sores or tenderness. Lungs: No shortness of breath or cough.  No hemoptysis. Cardiac:  No chest pain, palpitations, orthopnea, or PND. GI:  Eating well.  Recent diarrhea, resolved.  No nausea, vomiting, diarrhea, constipation, melena or hematochezia. GU:  No urgency, frequency, dysuria, or hematuria. Musculoskeletal:  Chronic left knee problem.  Bilateral hip and upper thigh  pain (see HPI). No back pain.  No muscle tenderness. Extremities:  No pain or swelling. Skin:  No rashes or skin changes. Neuro:  Little numbness in 1st and 2nd digit (Left > right).  No headache, weakness, balance or coordination issues. Endocrine:  No diabetes, thyroid issues, hot flashes or night sweats. Psych:  No mood changes, depression or anxiety. Pain:  7/10- bilateral hips (see HPI). Review of systems:  All other systems reviewed and found to be negative.   Physical Exam:  Blood pressure (!) 142/79, pulse 94, temperature (!) 97.1 F (36.2 C), resp. rate 20, weight 185 lb 3 oz (84 kg). GENERAL:  Elderly woman man sitting comfortably in the exam room in no acute distress.  She needs assistance onto exam  table. MENTAL STATUS:  Alert and oriented to person, place and time. HEAD:  Curly gray hair.  Normocephalic, atraumatic, face symmetric, no Cushingoid features. EYES:  Hazel eyes.  Pupils equal round and reactive to light and accomodation.  No conjunctivitis or scleral icterus. ENT:  Oropharynx clear without lesion.  Tongue normal. Mucous membranes moist.  RESPIRATORY:  Clear to auscultation without rales, wheezes or rhonchi. CARDIOVASCULAR:  Regular rate and rhythm without murmur, rub or gallop. ABDOMEN:  Ventral hernia.  Soft, non-tender, with active bowel sounds, and no appreciable hepatosplenomegaly.  No masses. BACK:  No pain on palpation.  SKIN:  No rashes, ulcers or lesions. EXTREMITIES: Pain in hip joints bilaterally on movement.  No edema, no skin discoloration or tenderness.  No palpable cords. LYMPH NODES: No palpable cervical, supraclavicular, axillary or inguinal adenopathy  NEUROLOGICAL: Unremarkable. PSYCH:  Appropriate.    Results for orders placed or performed in visit on 11/23/17 (from the past 72 hour(s))  Magnesium     Status: Abnormal   Collection Time: 11/23/17  8:49 AM  Result Value Ref Range   Magnesium 1.5 (L) 1.7 - 2.4 mg/dL    Comment: Performed at  [facility], [address] [place], [place] [address]  Uric acid     Status: None   Collection Time: 11/23/17  8:49 AM  Result Value Ref Range   Uric Acid, Serum 4.2 2.3 - 6.6 mg/dL    Comment: Performed at [facility], [address] [place], [place] [address]  Lactate dehydrogenase     Status: None   Collection Time: 11/23/17  8:49 AM  Result Value Ref Range   LDH 121 98 - 192 U/L    Comment: Performed at [facility], [address] [place], [place] [address]  Comprehensive metabolic panel     Status: Abnormal   Collection Time: 11/23/17  8:49 AM  Result Value Ref Range   Sodium 137 135 - 145 mmol/L   Potassium 3.0 (L) 3.5 - 5.1 mmol/L   Chloride 105 101 - 111 mmol/L   CO2 22 22 - 32 mmol/L   Glucose, Bld 128 (H) 65 - 99 mg/dL   BUN 30 (H) 6 - 20 mg/dL   Creatinine, Ser 1.28 (H) 0.44 - 1.00 mg/dL   Calcium 9.2 8.9 - 10.3 mg/dL   Total Protein 6.6 6.5 - 8.1 g/dL   Albumin 3.6 3.5 - 5.0 g/dL   AST 30 15 - 41 U/L   ALT 19 14 - 54 U/L   Alkaline Phosphatase 84 38 - 126 U/L   Total Bilirubin 0.9 0.3 - 1.2 mg/dL   GFR calc non Af Amer 40 (L) >60 mL/min   GFR calc Af Amer 47 (L) >60 mL/min    Comment: (NOTE) The eGFR has been calculated using the CKD EPI equation. This calculation has not been validated in all clinical situations. eGFR's persistently <60 mL/min signify possible Chronic Kidney Disease.    Anion gap 10 5 - 15    Comment: Performed at [facility], [address] [place], [place] [address]  CBC with Differential/Platelet     Status: Abnormal   Collection Time: 11/23/17  8:49 AM  Result Value Ref Range   WBC 8.8 3.6 - 11.0 K/uL   RBC 3.89 3.80 - 5.20 MIL/uL   Hemoglobin 11.5 (L) 12.0 - 16.0 g/dL   HCT 34.0 (L) 35.0 - 47.0 %   MCV 87.4 80.0 - 100.0 fL   MCH 29.5 26.0 -  34.0 pg   MCHC 33.8 32.0 - 36.0 g/dL   RDW 17.5 (H) 11.5 - 14.5 %   Platelets 334 150 - 440 K/uL   Neutrophils Relative % 64 %   Neutro Abs 5.7 1.4 - 6.5  K/uL   Lymphocytes Relative 19 %   Lymphs Abs 1.6 1.0 - 3.6 K/uL   Monocytes Relative 11 %   Monocytes Absolute 1.0 (H) 0.2 - 0.9 K/uL   Eosinophils Relative 5 %   Eosinophils Absolute 0.4 0 - 0.7 K/uL   Basophils Relative 1 %   Basophils Absolute 0.1 0 - 0.1 K/uL    Comment: Performed at [facility], [address] [place], [place] [address]    Radiology studies: 02/20/2016:  Chest, abdomen and pelvic CT revealed a 3.7 cm subpleural mass within the left lower lobe, a 1.1 cm right lower lobe subpleural nodule , 9 mm RUL nodule, and 0.4 cm lingular nodule.  Between the left second, third, and fourth ribs there was 1.9 cm soft tissue mass. There was a 2.5 cm low-attenuation nodule in the left lobe of the thyroid.   There were prominent left axillary nodes (up to 1.0 cm).   There was no definite mediastinal adenopathy. There was a 5.5 x 4.6 cm cystic lesion in the left hepatic lobe.  There was a 3.1 cm nodule in the right adrenal gland. There was a 1.2 cm right retrocaval lymph node with additional prominent subcentimeter retroperitoneal nodes. There was 1.0 cm left common iliac node. There was a 6.2 x 5.2 cm soft tissue mass within the right ilium.  02/21/2016:  Head MRI revealed no evidence of malignancy. Thyroid ultrasound on 02/21/2016 revealed a 2.4 cm solid nodule in the inferior aspect of the left lobe 03/03/2016:  PET scan revealed a hypermetabolic left lower lobe mass, bilateral pleural and pulmonary parenchymal nodular metastasis, a large pleural lesion invading the anterior left chest wall.   There was activity in the superior left ocular orbit.  There was a large lesion centered in the right iliac wing with soft tissue extension.  There were hypermetabolic right external iliac lymph nodes 06/06/2016:  PET scan revealed significant interval response to therapy.  There was significant decrease in size and FDG uptake associated with left lower lobe lung lesion.  There was resolution of  left-sided pleural base metastasis and left axillary and anterior mediastinal nodal metastasis.  There was resolution of previous hypermetabolic soft tissue metastasis/nodal metastasis posterior to the IVC.  There was marked improvement in hypermetabolic metastasis involving the right iliac wing.  There was persistent right upper lobe lung nodule which exhibits intense radiotracer uptake which may represent a focus of metastatic disease or synchronous primary lung neoplasm. 06/01/2017:  PET scan revealed no new or progressive findings.  The 9 mm RUL pulmonary nodule remained hypermetabolic (SUV 10.2, previously 7.3) and no change in size.  The index lesion in the posterior left lower lobe was minimally smaller on CT imaging with very low level FDG uptake (SUV 1.6).  There was no change in the appearance at site of previous disease at the right iliac wing (SUV 1.9 compared to 2.6 previously). 01/29/2017:  PET scan revealed interval progression of hypermetabolic nodules in the right upper (13 mm compared to 9 mm; SUV 10) and left lower lobes (26 mm compared to 22 mm; SUV 7.5).  There was new hypermetabolic focus of activity along the anterior left pleura although no underlying pleural or lung mass was evident. 09/18/2017:  PET scan revealed a mixed appearance, with marked improvement in the prior 2 pulmonary nodules; but with new Deauville 5 left common iliac lymph node enlargement; new Deauville 5 right paraspinal lesion; and new Deauville 4 left infrahilar nodal activity.  There was a focus of accentuated metabolic activity which had been present along the right lower submandibular gland but currently seemed to project over the subcutaneous tissues without CT correlate. Current maximum SUV 10.5, previously 9.7.  There was increased accentuated activity along the left lateral spurring at L2-3. While typically such activity is inflammatory due to spurring, this had increased significantly since the prior exam and  merit surveillance. There was a new 8 by 5 mm right upper lobe pulmonary nodule, Deauville 2, which also merits surveillance.   Assessment:  Toni Griffith is a 77 y.o. female with stage IVB B-cell lymphoma, unclassifiable, with features intermediate between diffuse large B-cell lymphoma and classical Hodgkin's lymphoma ("gray zone" lymphoma).  She underwent right iliac wing bone/bone marrow biopsy on 03/08/2016.  Head MRI on 02/21/2016 revealed no evidence of malignancy. 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   There was activity in the superior left ocular orbit.  There was a large lesion centered in the right iliac wing with soft tissue extension.  There were hypermetabolic right external iliac lymph nodes  She was admitted to [facility] in [facility].  She had renal failure and hypercalcemia. She was treated with IVF, calcitonin and Zometa.The patient has a history of coronary artery disease s/p CABG in 1993.  She has no history of heart failure.  She is deaf and requires an interpreter.  Echo on 04/04/2016 revealed an EF of 55-60%.  Hepatitis B and C testing was negative on 03/30/2016.   G6PD assay was normal.  She declined LP with MTX prophylaxis.  She received 6 cycles of mini-RCHOP (04/07/2016 - 07/21/2016).  She has had some transient fingertip numbness.  She received radiation to the right iliac wing,  from 10/11/2016 - 11/01/2016.  She received 3000 cGy over 3 weeks.  PET scan on 01/29/2017 revealed interval progression of hypermetabolic nodules in the right upper (13 mm compared to 9 mm; SUV 10) and left lower lobes (26 mm compared to 22 mm; SUV 7.5).  There was new hypermetabolic focus of activity along the anterior left  pleura although no underlying pleural or lung mass was evident.  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  She underwent SBRT 03/26/2017 - 04/09/2017.  She received 5000 cGy in 5 fractions to the left lower lobe lesion.  She underwent SBRT to a RUL lesion from 05/23/2017 - 06/18/2017.  PET scan on 09/18/2017 revealed a mixed appearance, with marked improvement in the prior 2 pulmonary nodules; but with new Deauville 5 left common iliac lymph node enlargement; new Deauville 5 right paraspinal lesion; and new Deauville 4 left infrahilar nodal activity.  There was a focus of accentuated metabolic activity which had been present along the right lower submandibular gland but currently seemed to project over the subcutaneous tissues without CT correlate. Current maximum SUV 10.5, previously 9.7. There was increased accentuated activity along the left lateral spurring at L2-3. While typically such activity is inflammatory due to spurring, this had  increased significantly since the prior exam and merit surveillance. There was a new 8 by 5 mm right upper lobe pulmonary nodule, Deauville 2.  CT guided right paraspinal lesion biopsy on 10/16/2017 was compatible with involvement by the patient's previously diagnosed lymphoma.  The case was referred to [facility] hematopathology.  The neoplastic cells were largely CD30 negative.  Repeat CD30 immunohistochemistry faintly stained > 10% of the large atypical cells. CD20 stained the scattered large atypical cells and few small cells.   She has a normocytic anemia.  Labs on 04/17/2016 revealed an elevated ferritin (444), iron saturation (11%), low TIBC (240), B12 (2409), and folate (10.8).  Symptomatically, she has pain in her hips and upper thighs. 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  She notes some recent diarrhea, which has now resolved. She denies fevers. Exam is stable.  Labs are unremarkable.    Plan: 1.    Labs today: CBC with differential, CMP, Mg, LDH, uric acid.  2.    Review hepatitis testing and JC virus negative. 3.    Review plans for treatment using brentuximab vedotin (Adcetris) once a week every 3 weeks for 2-3 cycles, followed by re-imaging studies to determine effectiveness. If medication not demonstrating promising effects, alternate options will need to be considered. Reviewed potential medication side effects in detail including PML, rash including Stevens-Johnson syndrome, neuropathy, liver function abnormalities, pancreatitis, myelosuppression, infection, and cough/shortness of breath.  Written information also provided.  Patient provides consent, via sign language interpreter, to begin treatment today as planned.  4.    Labs reviewed. Blood counts stable and adequate enough for treatment. Will proceed with cycle # 1 Adcetris today as planned.  5.    Discuss symptom management.  Patient has antiemetics at home to use on a PRN basis. Will send in Rx for hip pain: tramadol 25 - 50 mg q6h PRN (Disp #30).  6.    Discuss neutropenic precautions. Encouraged to avoid crouds and close contacts with sick individuals. Reviewed handwashing precautions.  7.    Discuss HYPOkalemia. Potassium 3.0. Will replace with 20 mEq IV KCl today. Will also Rx: K-Dur 20 mEq daily x 2 days.  8.    Discuss bilateral hips pain on exam. Will obtain diagnostic plain films today.  Consider bone scan in future if pain persists given prior history of bone metastasis. 9.    RTC on 12/03/2017 for MD assessment and labs (CBC, BMP, Mg, uric acid). 10.  RTC on 12/14/2017 for MD assessment, labs (CBC with diff, CMP, Mg, LDH, uric acid), and cycle # 2 Adcetris.    Honor Loh, NP  11/23/2017, 10:19 AM   I saw and evaluated the patient, participating in the key portions of the service and reviewing pertinent diagnostic studies and records.  I reviewed the nurse practitioner's note and agree with the findings and  the plan.  The assessment and plan were discussed with the patient.  Additional diagnostic studies of plain films are needed to clarify hip pain and would change the clinical management.  Multiple questions were asked by the patient and answered.   Nolon Stalls, MD 11/23/2017,10:19 AM

## 2017-11-20 NOTE — Progress Notes (Signed)
This encounter was created in error - please disregard.

## 2017-11-20 NOTE — Progress Notes (Deleted)
This encounter was created in error - please disregard.

## 2017-11-20 NOTE — Addendum Note (Signed)
Addended by: Honor Loh on: 11/20/2017 01:51 PM   Modules accepted: Level of Service, SmartSet

## 2017-11-20 NOTE — Addendum Note (Signed)
Addended by: Honor Loh on: 11/20/2017 01:48 PM   Modules accepted: Level of Service, SmartSet

## 2017-11-23 ENCOUNTER — Ambulatory Visit
Admission: RE | Admit: 2017-11-23 | Discharge: 2017-11-23 | Disposition: A | Discharge status: Home / Self Care | Payer: Medicare HMO | Source: Ambulatory Visit | Attending: Hematology and Oncology | Admitting: Hematology and Oncology

## 2017-11-23 ENCOUNTER — Inpatient Hospital Stay: Payer: Medicare HMO

## 2017-11-23 ENCOUNTER — Encounter: Payer: Self-pay | Admitting: Hematology and Oncology

## 2017-11-23 ENCOUNTER — Inpatient Hospital Stay (HOSPITAL_BASED_OUTPATIENT_CLINIC_OR_DEPARTMENT_OTHER): Payer: Medicare HMO | Admitting: Hematology and Oncology

## 2017-11-23 ENCOUNTER — Other Ambulatory Visit: Payer: Self-pay | Admitting: Urgent Care

## 2017-11-23 ENCOUNTER — Other Ambulatory Visit: Payer: Self-pay | Admitting: Hematology and Oncology

## 2017-11-23 ENCOUNTER — Ambulatory Visit
Admission: RE | Admit: 2017-11-23 | Discharge: 2017-11-23 | Disposition: A | Discharge status: Home / Self Care | Payer: Medicare HMO | Source: Ambulatory Visit | Attending: Urgent Care | Admitting: Urgent Care

## 2017-11-23 VITALS — BP 142/79 | HR 94 | Temp 97.1°F | Resp 20 | Wt 185.2 lb

## 2017-11-23 DIAGNOSIS — C8518 Unspecified B-cell lymphoma, lymph nodes of multiple sites: Secondary | ICD-10-CM

## 2017-11-23 DIAGNOSIS — Z9221 Personal history of antineoplastic chemotherapy: Secondary | ICD-10-CM

## 2017-11-23 DIAGNOSIS — G620 Drug-induced polyneuropathy: Secondary | ICD-10-CM

## 2017-11-23 DIAGNOSIS — T451X5A Adverse effect of antineoplastic and immunosuppressive drugs, initial encounter: Secondary | ICD-10-CM

## 2017-11-23 DIAGNOSIS — C7951 Secondary malignant neoplasm of bone: Secondary | ICD-10-CM

## 2017-11-23 DIAGNOSIS — M25551 Pain in right hip: Secondary | ICD-10-CM | POA: Diagnosis not present

## 2017-11-23 DIAGNOSIS — Z7982 Long term (current) use of aspirin: Secondary | ICD-10-CM

## 2017-11-23 DIAGNOSIS — Z87891 Personal history of nicotine dependence: Secondary | ICD-10-CM

## 2017-11-23 DIAGNOSIS — G894 Chronic pain syndrome: Secondary | ICD-10-CM

## 2017-11-23 DIAGNOSIS — E876 Hypokalemia: Secondary | ICD-10-CM

## 2017-11-23 DIAGNOSIS — C8590 Non-Hodgkin lymphoma, unspecified, unspecified site: Secondary | ICD-10-CM

## 2017-11-23 DIAGNOSIS — Z5112 Encounter for antineoplastic immunotherapy: Secondary | ICD-10-CM | POA: Insufficient documentation

## 2017-11-23 DIAGNOSIS — M160 Bilateral primary osteoarthritis of hip: Secondary | ICD-10-CM | POA: Insufficient documentation

## 2017-11-23 DIAGNOSIS — Z923 Personal history of irradiation: Secondary | ICD-10-CM

## 2017-11-23 DIAGNOSIS — Z7189 Other specified counseling: Secondary | ICD-10-CM

## 2017-11-23 DIAGNOSIS — Z801 Family history of malignant neoplasm of trachea, bronchus and lung: Secondary | ICD-10-CM

## 2017-11-23 DIAGNOSIS — C3432 Malignant neoplasm of lower lobe, left bronchus or lung: Secondary | ICD-10-CM | POA: Diagnosis not present

## 2017-11-23 DIAGNOSIS — M25552 Pain in left hip: Secondary | ICD-10-CM

## 2017-11-23 DIAGNOSIS — Z79899 Other long term (current) drug therapy: Secondary | ICD-10-CM

## 2017-11-23 LAB — CBC WITH DIFFERENTIAL/PLATELET
Basophils Absolute: 0.1 10*3/uL (ref 0–0.1)
Basophils Relative: 1 %
Eosinophils Absolute: 0.4 10*3/uL (ref 0–0.7)
Eosinophils Relative: 5 %
HCT: 34 % — ABNORMAL LOW (ref 35.0–47.0)
Hemoglobin: 11.5 g/dL — ABNORMAL LOW (ref 12.0–16.0)
Lymphocytes Relative: 19 %
Lymphs Abs: 1.6 10*3/uL (ref 1.0–3.6)
MCH: 29.5 pg (ref 26.0–34.0)
MCHC: 33.8 g/dL (ref 32.0–36.0)
MCV: 87.4 fL (ref 80.0–100.0)
Monocytes Absolute: 1 10*3/uL — ABNORMAL HIGH (ref 0.2–0.9)
Monocytes Relative: 11 %
Neutro Abs: 5.7 10*3/uL (ref 1.4–6.5)
Neutrophils Relative %: 64 %
Platelets: 334 10*3/uL (ref 150–440)
RBC: 3.89 MIL/uL (ref 3.80–5.20)
RDW: 17.5 % — ABNORMAL HIGH (ref 11.5–14.5)
WBC: 8.8 10*3/uL (ref 3.6–11.0)

## 2017-11-23 LAB — COMPREHENSIVE METABOLIC PANEL
ALT: 19 U/L (ref 14–54)
AST: 30 U/L (ref 15–41)
Albumin: 3.6 g/dL (ref 3.5–5.0)
Alkaline Phosphatase: 84 U/L (ref 38–126)
Anion gap: 10 (ref 5–15)
BUN: 30 mg/dL — ABNORMAL HIGH (ref 6–20)
CO2: 22 mmol/L (ref 22–32)
Calcium: 9.2 mg/dL (ref 8.9–10.3)
Chloride: 105 mmol/L (ref 101–111)
Creatinine, Ser: 1.28 mg/dL — ABNORMAL HIGH (ref 0.44–1.00)
GFR calc Af Amer: 47 mL/min — ABNORMAL LOW (ref >60)
GFR calc non Af Amer: 40 mL/min — ABNORMAL LOW (ref >60)
Glucose, Bld: 128 mg/dL — ABNORMAL HIGH (ref 65–99)
Potassium: 3 mmol/L — ABNORMAL LOW (ref 3.5–5.1)
Sodium: 137 mmol/L (ref 135–145)
Total Bilirubin: 0.9 mg/dL (ref 0.3–1.2)
Total Protein: 6.6 g/dL (ref 6.5–8.1)

## 2017-11-23 LAB — URIC ACID: Uric Acid, Serum: 4.2 mg/dL (ref 2.3–6.6)

## 2017-11-23 LAB — LACTATE DEHYDROGENASE: LDH: 121 U/L (ref 98–192)

## 2017-11-23 LAB — MAGNESIUM: Magnesium: 1.5 mg/dL — ABNORMAL LOW (ref 1.7–2.4)

## 2017-11-23 MED ORDER — TRAMADOL HCL 50 MG PO TABS
25.0000 mg | ORAL_TABLET | Freq: Once | ORAL | Status: AC
  Administered 2017-11-23: 25 mg via ORAL
  Filled 2017-11-23: qty 1

## 2017-11-23 MED ORDER — POTASSIUM CHLORIDE 2 MEQ/ML IV SOLN: Freq: Once | INTRAVENOUS | Status: DC

## 2017-11-23 MED ORDER — DEXAMETHASONE SODIUM PHOSPHATE 10 MG/ML IJ SOLN
10.0000 mg | Freq: Once | INTRAMUSCULAR | Status: AC
  Administered 2017-11-23: 10 mg via INTRAVENOUS
  Filled 2017-11-23: qty 1

## 2017-11-23 MED ORDER — SODIUM CHLORIDE 0.9 % IV SOLN
150.0000 mg | Freq: Once | INTRAVENOUS | Status: AC
  Administered 2017-11-23: 150 mg via INTRAVENOUS
  Filled 2017-11-23: qty 30

## 2017-11-23 MED ORDER — ACETAMINOPHEN 325 MG PO TABS
650.0000 mg | ORAL_TABLET | Freq: Once | ORAL | Status: AC
  Administered 2017-11-23: 650 mg via ORAL
  Filled 2017-11-23: qty 2

## 2017-11-23 MED ORDER — SODIUM CHLORIDE 0.9 % IV SOLN
20.0000 meq | Freq: Once | INTRAVENOUS | Status: AC
  Administered 2017-11-23: 20 meq via INTRAVENOUS
  Filled 2017-11-23: qty 10

## 2017-11-23 MED ORDER — SODIUM CHLORIDE 0.9 % IV SOLN: 10.0000 mg | Freq: Once | INTRAVENOUS | Status: DC

## 2017-11-23 MED ORDER — SODIUM CHLORIDE 0.9 % IV SOLN
Freq: Once | INTRAVENOUS | Status: AC
  Administered 2017-11-23: 11:00:00 via INTRAVENOUS
  Filled 2017-11-23: qty 1000

## 2017-11-23 MED ORDER — DIPHENHYDRAMINE HCL 50 MG/ML IJ SOLN
50.0000 mg | Freq: Once | INTRAMUSCULAR | Status: AC
  Administered 2017-11-23: 50 mg via INTRAVENOUS
  Filled 2017-11-23: qty 1

## 2017-11-23 MED ORDER — TRAMADOL HCL 50 MG PO TABS: ORAL_TABLET | ORAL | 0 refills | Status: DC

## 2017-11-23 MED ORDER — POTASSIUM CHLORIDE CRYS ER 20 MEQ PO TBCR: 20.0000 meq | EXTENDED_RELEASE_TABLET | Freq: Every day | ORAL | 0 refills | Status: DC

## 2017-11-23 MED ORDER — HEPARIN SOD (PORK) LOCK FLUSH 100 UNIT/ML IV SOLN
500.0000 [IU] | Freq: Once | INTRAVENOUS | Status: AC | PRN
  Administered 2017-11-23: 500 [IU]
  Filled 2017-11-23: qty 5

## 2017-11-23 NOTE — Patient Instructions (Signed)
Brentuximab vedotin solution for injection What is this medicine? BRENTUXIMAB VEDOTIN (bren TUX see mab ve DOE tin) is a monoclonal antibody and a chemotherapy drug. It is used for treating Hodgkin lymphoma and certain non-Hodgkin lymphomas, such as anaplastic large-cell lymphoma and mycosis fungoides. This medicine may be used for other purposes; ask your health care provider or pharmacist if you have questions. COMMON BRAND NAME(S): ADCETRIS What should I tell my health care provider before I take this medicine? They need to know if you have any of these conditions: -immune system problems -infection (especially a virus infection such as chickenpox, cold sores, or herpes) -kidney disease -liver disease -low blood counts, like low white cell, platelet, or red cell counts -tingling of the fingers or toes, or other nerve disorder -an unusual or allergic reaction to brentuximab vedotin, other medicines, foods, dyes, or preservatives -pregnant or trying to get pregnant -breast-feeding How should I use this medicine? This medicine is for infusion into a vein. It is given by a health care professional in a hospital or clinic setting. Talk to your pediatrician regarding the use of this medicine in children. Special care may be needed. Overdosage: If you think you have taken too much of this medicine contact a poison control center or emergency room at once. NOTE: This medicine is only for you. Do not share this medicine with others. What if I miss a dose? It is important not to miss your dose. Call your doctor or health care professional if you are unable to keep an appointment. What may interact with this medicine? This medicine may interact with the following medications: -ketoconazole -rifampin -St. John's wort; Hypericum perforatum This list may not describe all possible interactions. Give your health care provider a list of all the medicines, herbs, non-prescription drugs, or dietary  supplements you use. Also tell them if you smoke, drink alcohol, or use illegal drugs. Some items may interact with your medicine. What should I watch for while using this medicine? Visit your doctor for checks on your progress. This drug may make you feel generally unwell. Report any side effects. Continue your course of treatment even though you feel ill unless your doctor tells you to stop. Call your doctor or health care professional for advice if you get a fever, chills or sore throat, or other symptoms of a cold or flu. Do not treat yourself. This drug decreases your body's ability to fight infections. Try to avoid being around people who are sick. This medicine may increase your risk to bruise or bleed. Call your doctor or health care professional if you notice any unusual bleeding. In some patients, this medicine may cause a serious brain infection that may cause death. If you have any problems seeing, thinking, speaking, walking, or standing, tell your doctor right away. If you cannot reach your doctor, urgently seek other source of medical care. Do not become pregnant while taking this medicine or for 6 months after stopping it. Women should inform their doctor if they wish to become pregnant or think they might be pregnant. Men should not father a child while taking this medicine and for 6 months after stopping it. There is a potential for serious side effects to an unborn child. Talk to your health care professional or pharmacist for more information. Do not breast-feed an infant while taking this medicine. This may interfere with the ability to father a child. You should talk to your doctor or health care professional if you are concerned about your  fertility. What side effects may I notice from receiving this medicine? Side effects that you should report to your doctor or health care professional as soon as possible: -allergic reactions like skin rash, itching or hives, swelling of the face,  lips, or tongue -changes in emotions or moods -diarrhea -low blood counts - this medicine may decrease the number of white blood cells, red blood cells and platelets. You may be at increased risk for infections and bleeding. -pain, tingling, numbness in the hands or feet -redness, blistering, peeling or loosening of the skin, including inside the mouth -shortness of breath -signs of infection - fever or chills, cough, sore throat, pain or difficulty passing urine -signs of decreased platelets or bleeding - bruising, pinpoint red spots on the skin, black, tarry stools, blood in the urine -signs of decreased red blood cells - unusually weak or tired, fainting spells, lightheadedness -signs of liver injury like dark yellow or brown urine; general ill feeling or flu-like symptoms; light-colored stools; loss of appetite; nausea; right upper belly pain; yellowing of the eyes or skin -stomach pain -sudden numbness or weakness of the face, arm or leg -vomiting Side effects that usually do not require medical attention (report to your doctor or health care professional if they continue or are bothersome): -dizziness -headache -muscle pain -tiredness This list may not describe all possible side effects. Call your doctor for medical advice about side effects. You may report side effects to FDA at 1-800-FDA-1088. Where should I keep my medicine? This drug is given in a hospital or clinic and will not be stored at home. NOTE: This sheet is a summary. It may not cover all possible information. If you have questions about this medicine, talk to your doctor, pharmacist, or health care provider.  2018 [employer] (2016-06-09 18:06:58)

## 2017-11-23 NOTE — Progress Notes (Signed)
Per pt request, please call with X-ray results, [name]., NP notified. Pt and her daughter also request that Benadryl dosage be decreased for next tx. Pt complained on restless legs/knees aching after administration, provider notified. Pt and daughter also informed via interpretor that they should discuss this with provider during next visit, understanding verbalized on both parties. Pt reports prior to completing tx today that her legs feel better after pain meds given.

## 2017-11-23 NOTE — Progress Notes (Signed)
Patient here today for initiation of new chemotherapy treatment.  Offers no complaints today except for bilateral upper leg pain.  Accompanied by her daughter today.  We used video interpreter today - Rusty - [phone].

## 2017-11-24 DIAGNOSIS — G620 Drug-induced polyneuropathy: Secondary | ICD-10-CM | POA: Insufficient documentation

## 2017-11-24 DIAGNOSIS — M25552 Pain in left hip: Secondary | ICD-10-CM

## 2017-11-24 DIAGNOSIS — M25551 Pain in right hip: Secondary | ICD-10-CM | POA: Insufficient documentation

## 2017-11-24 DIAGNOSIS — T451X5A Adverse effect of antineoplastic and immunosuppressive drugs, initial encounter: Secondary | ICD-10-CM

## 2017-11-25 ENCOUNTER — Other Ambulatory Visit: Payer: Self-pay | Admitting: Hematology and Oncology

## 2017-11-25 DIAGNOSIS — E876 Hypokalemia: Secondary | ICD-10-CM

## 2017-11-26 ENCOUNTER — Inpatient Hospital Stay: Payer: Medicare HMO

## 2017-11-26 ENCOUNTER — Encounter: Payer: Self-pay | Admitting: Urgent Care

## 2017-11-26 DIAGNOSIS — E876 Hypokalemia: Secondary | ICD-10-CM

## 2017-11-26 DIAGNOSIS — C8518 Unspecified B-cell lymphoma, lymph nodes of multiple sites: Secondary | ICD-10-CM | POA: Diagnosis not present

## 2017-11-26 LAB — BASIC METABOLIC PANEL
Anion gap: 8 (ref 5–15)
BUN: 25 mg/dL — ABNORMAL HIGH (ref 6–20)
CO2: 24 mmol/L (ref 22–32)
Calcium: 9.1 mg/dL (ref 8.9–10.3)
Chloride: 101 mmol/L (ref 101–111)
Creatinine, Ser: 1.34 mg/dL — ABNORMAL HIGH (ref 0.44–1.00)
GFR calc Af Amer: 44 mL/min — ABNORMAL LOW (ref >60)
GFR calc non Af Amer: 38 mL/min — ABNORMAL LOW (ref >60)
Glucose, Bld: 119 mg/dL — ABNORMAL HIGH (ref 65–99)
Potassium: 3.6 mmol/L (ref 3.5–5.1)
Sodium: 133 mmol/L — ABNORMAL LOW (ref 135–145)

## 2017-12-03 ENCOUNTER — Other Ambulatory Visit: Payer: Medicare HMO

## 2017-12-03 ENCOUNTER — Ambulatory Visit: Payer: Medicare HMO | Admitting: Hematology and Oncology

## 2017-12-06 ENCOUNTER — Telehealth: Payer: Self-pay | Admitting: *Deleted

## 2017-12-06 ENCOUNTER — Inpatient Hospital Stay (HOSPITAL_BASED_OUTPATIENT_CLINIC_OR_DEPARTMENT_OTHER): Payer: Medicare HMO | Admitting: Hematology and Oncology

## 2017-12-06 ENCOUNTER — Encounter: Payer: Self-pay | Admitting: Hematology and Oncology

## 2017-12-06 ENCOUNTER — Other Ambulatory Visit: Payer: Self-pay

## 2017-12-06 ENCOUNTER — Encounter: Payer: Self-pay | Admitting: Urgent Care

## 2017-12-06 ENCOUNTER — Other Ambulatory Visit: Payer: Self-pay | Admitting: Hematology and Oncology

## 2017-12-06 ENCOUNTER — Inpatient Hospital Stay: Payer: Medicare HMO | Attending: Hematology and Oncology

## 2017-12-06 ENCOUNTER — Inpatient Hospital Stay: Payer: Medicare HMO

## 2017-12-06 VITALS — BP 157/85 | HR 106 | Temp 96.7°F | Resp 18 | Wt 181.3 lb

## 2017-12-06 DIAGNOSIS — G620 Drug-induced polyneuropathy: Secondary | ICD-10-CM | POA: Insufficient documentation

## 2017-12-06 DIAGNOSIS — E876 Hypokalemia: Secondary | ICD-10-CM

## 2017-12-06 DIAGNOSIS — Z9221 Personal history of antineoplastic chemotherapy: Secondary | ICD-10-CM | POA: Insufficient documentation

## 2017-12-06 DIAGNOSIS — D649 Anemia, unspecified: Secondary | ICD-10-CM | POA: Diagnosis not present

## 2017-12-06 DIAGNOSIS — M25562 Pain in left knee: Secondary | ICD-10-CM | POA: Diagnosis not present

## 2017-12-06 DIAGNOSIS — Z7982 Long term (current) use of aspirin: Secondary | ICD-10-CM

## 2017-12-06 DIAGNOSIS — Z801 Family history of malignant neoplasm of trachea, bronchus and lung: Secondary | ICD-10-CM

## 2017-12-06 DIAGNOSIS — R197 Diarrhea, unspecified: Secondary | ICD-10-CM | POA: Diagnosis not present

## 2017-12-06 DIAGNOSIS — M25552 Pain in left hip: Secondary | ICD-10-CM | POA: Insufficient documentation

## 2017-12-06 DIAGNOSIS — G893 Neoplasm related pain (acute) (chronic): Secondary | ICD-10-CM | POA: Diagnosis not present

## 2017-12-06 DIAGNOSIS — C8518 Unspecified B-cell lymphoma, lymph nodes of multiple sites: Secondary | ICD-10-CM | POA: Insufficient documentation

## 2017-12-06 DIAGNOSIS — Z87891 Personal history of nicotine dependence: Secondary | ICD-10-CM | POA: Insufficient documentation

## 2017-12-06 DIAGNOSIS — Z5112 Encounter for antineoplastic immunotherapy: Secondary | ICD-10-CM | POA: Diagnosis present

## 2017-12-06 DIAGNOSIS — G629 Polyneuropathy, unspecified: Secondary | ICD-10-CM

## 2017-12-06 DIAGNOSIS — Z79899 Other long term (current) drug therapy: Secondary | ICD-10-CM

## 2017-12-06 DIAGNOSIS — M25551 Pain in right hip: Secondary | ICD-10-CM

## 2017-12-06 DIAGNOSIS — Z923 Personal history of irradiation: Secondary | ICD-10-CM | POA: Insufficient documentation

## 2017-12-06 LAB — BASIC METABOLIC PANEL
Anion gap: 11 (ref 5–15)
BUN: 21 mg/dL — ABNORMAL HIGH (ref 6–20)
CO2: 22 mmol/L (ref 22–32)
Calcium: 8.9 mg/dL (ref 8.9–10.3)
Chloride: 99 mmol/L — ABNORMAL LOW (ref 101–111)
Creatinine, Ser: 1.48 mg/dL — ABNORMAL HIGH (ref 0.44–1.00)
GFR calc Af Amer: 39 mL/min — ABNORMAL LOW (ref >60)
GFR calc non Af Amer: 34 mL/min — ABNORMAL LOW (ref >60)
Glucose, Bld: 118 mg/dL — ABNORMAL HIGH (ref 65–99)
Potassium: 3.3 mmol/L — ABNORMAL LOW (ref 3.5–5.1)
Sodium: 132 mmol/L — ABNORMAL LOW (ref 135–145)

## 2017-12-06 LAB — GASTROINTESTINAL PANEL BY PCR, STOOL (REPLACES STOOL CULTURE)
ADENOVIRUS F40/41: NOT DETECTED
Astrovirus: NOT DETECTED
CRYPTOSPORIDIUM: NOT DETECTED
CYCLOSPORA CAYETANENSIS: NOT DETECTED
Campylobacter species: NOT DETECTED
ENTAMOEBA HISTOLYTICA: NOT DETECTED
ENTEROPATHOGENIC E COLI (EPEC): NOT DETECTED
Enteroaggregative E coli (EAEC): NOT DETECTED
Enterotoxigenic E coli (ETEC): NOT DETECTED
Giardia lamblia: NOT DETECTED
Norovirus GI/GII: DETECTED — AB
Plesimonas shigelloides: NOT DETECTED
Rotavirus A: NOT DETECTED
SAPOVIRUS (I, II, IV, AND V): NOT DETECTED
Salmonella species: NOT DETECTED
Shiga like toxin producing E coli (STEC): NOT DETECTED
Shigella/Enteroinvasive E coli (EIEC): NOT DETECTED
VIBRIO CHOLERAE: NOT DETECTED
VIBRIO SPECIES: NOT DETECTED
YERSINIA ENTEROCOLITICA: NOT DETECTED

## 2017-12-06 LAB — CBC WITH DIFFERENTIAL/PLATELET
Basophils Absolute: 0.1 10*3/uL (ref 0–0.1)
Basophils Relative: 1 %
Eosinophils Absolute: 0.9 10*3/uL — ABNORMAL HIGH (ref 0–0.7)
Eosinophils Relative: 17 %
HCT: 32.9 % — ABNORMAL LOW (ref 35.0–47.0)
Hemoglobin: 11.2 g/dL — ABNORMAL LOW (ref 12.0–16.0)
Lymphocytes Relative: 22 %
Lymphs Abs: 1.2 10*3/uL (ref 1.0–3.6)
MCH: 29 pg (ref 26.0–34.0)
MCHC: 33.9 g/dL (ref 32.0–36.0)
MCV: 85.4 fL (ref 80.0–100.0)
Monocytes Absolute: 0.6 10*3/uL (ref 0.2–0.9)
Monocytes Relative: 11 %
Neutro Abs: 2.8 10*3/uL (ref 1.4–6.5)
Neutrophils Relative %: 49 %
Platelets: 451 10*3/uL — ABNORMAL HIGH (ref 150–440)
RBC: 3.86 MIL/uL (ref 3.80–5.20)
RDW: 17.8 % — ABNORMAL HIGH (ref 11.5–14.5)
WBC: 5.6 10*3/uL (ref 3.6–11.0)

## 2017-12-06 LAB — C DIFFICILE QUICK SCREEN W PCR REFLEX
C DIFFICLE (CDIFF) ANTIGEN: NEGATIVE
C Diff interpretation: NOT DETECTED
C Diff toxin: NEGATIVE

## 2017-12-06 LAB — URIC ACID: Uric Acid, Serum: 4.4 mg/dL (ref 2.3–6.6)

## 2017-12-06 LAB — VITAMIN B12: Vitamin B-12: 4121 pg/mL — ABNORMAL HIGH (ref 180–914)

## 2017-12-06 LAB — FOLATE: Folate: 12 ng/mL (ref >5.9)

## 2017-12-06 MED ORDER — POTASSIUM CHLORIDE CRYS ER 20 MEQ PO TBCR: 20.0000 meq | EXTENDED_RELEASE_TABLET | Freq: Every day | ORAL | 0 refills | Status: DC

## 2017-12-06 NOTE — Progress Notes (Signed)
Patient here for follow up. Accompanied by interpreter [place] (sign languge), son and boyfriend. States she is having Right knee pain that is keeping her up at night. Also, she is having diarrhea and is afraid to eat a lot because it might make her go more.

## 2017-12-06 NOTE — Telephone Encounter (Signed)
Called patient's daughter, [name], to inform her that her mother can stop her vitamin B-12, per [name].

## 2017-12-06 NOTE — Progress Notes (Signed)
[facility] day:  12/06/2017   Chief Complaint: Toni Griffith is a 77 y.o. female with stage IVB gray zone lymphoma who is seen for assessment on day 14 of cycle #1 brentuximab vedotin (Adcetris).  HPI:  The patient was last seen in the medical oncology clinic on 11/23/2017.  At that time, she described pain in her hips and upper thighs. 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  She noted recent diarrhea, which has had resolved. She denied fevers. Exam was stable. Potassium was 3.0.  She received cycle #1 brentuximab vedotin.  She received IV potassium as well as additional oral potassium at home.  Plain hip and pelvic films on 11/23/2017 revealed mild progressive bilateral hip osteoarthritis without evidence of acute abnormality.  During the interim, patient is doing "ok". She notes that she is not 100%, but close. She continues to have pain in her legs. Pain rated 6/10 today. Patient is using Tramadol 25 mg PRN., which improves her pain somewhat.   Patient experiencing 2-3 episodes of watery diarrhea daily; mainly after eating. 3 to 4 days prior to chemotherapy, patient had a "GI bug" that caused her to have nausea, vomiting, and diarrhea.  Patient denies bleeding; no hematochezia, melena, or gross hematuria.  Chemotherapy caused pain in her legs to worsen.   Patient is not having any B symptoms. She is eating ok. Weight is down 4 pounds.    Past Medical History:  Diagnosis Date  . Arthritis   . Cancer [facility])    lymphoma-right hip  . Deaf   . Hypertension     Past Surgical History:  Procedure Laterality Date  . CARDIAC SURGERY  1993  . CORONARY ARTERY BYPASS GRAFT    . CYST REMOVAL HAND    . PERIPHERAL VASCULAR CATHETERIZATION N/A 04/06/2016   Procedure: Glori Luis Cath Insertion;  Surgeon: Algernon Huxley, MD;  Location: [facility];  Service: Cardiovascular;  Laterality: N/A;    Family History  Problem Relation Age of  Onset  . Lung cancer Mother   . Lung cancer Father   . Diabetes Maternal Uncle   . Myasthenia gravis Maternal Grandmother   . Leukemia Maternal Grandfather     Social History:  reports that she quit smoking about 2 years ago. She has never used smokeless tobacco. She reports that she drinks alcohol. She reports that she does not use drugs.  She smoked 1 1/2 packs per day for 20 years (30 pack year smoking history) until 10/2015.  She is hearing impaired (deaf).  She lives in [place] with her boyfriend.  Her daughter lives in [place].  Her daughter, [name]'s contact number is [phone].  The patient is accompanied boyfriend, son, and ASL interpreter.   Allergies: No Known Allergies  Current Medications: Current Outpatient Medications  Medication Sig Dispense Refill  . acetaminophen (TYLENOL) 325 MG tablet Take 650 mg by mouth.    Marland Kitchen allopurinol (ZYLOPRIM) 300 MG tablet TAKE 1 TABLET BY MOUTH EVERY DAY 30 tablet 3  . aspirin 325 MG tablet Take 325 mg by mouth daily.    . B Complex Vitamins (B COMPLEX 1 PO) Take by mouth.     . celecoxib (CELEBREX) 200 MG capsule     . Cholecalciferol (VITAMIN D3) 2000 units capsule Take 2,000 Units by mouth daily.    Marland Kitchen FLUoxetine (PROZAC) 40 MG capsule     . gabapentin (NEURONTIN) 100 MG capsule  Take 1 capsule (100 mg total) by mouth 2 (two) times daily. 60 capsule 3  . KLOR-CON 10 10 MEQ tablet TAKE 1 TABLET (10 MEQ TOTAL) BY MOUTH 2 (TWO) TIMES DAILY. 60 tablet 0  . lidocaine-prilocaine (EMLA) cream Apply cream 1 hour before chemotherapy treatment and place small peive of saran wrap over cream to protect clothing 30 g 1  . naproxen sodium (ANAPROX) 220 MG tablet Take 220 mg by mouth as needed.    . ondansetron (ZOFRAN ODT) 4 MG disintegrating tablet Take 1 tablet (4 mg total) by mouth every 8 (eight) hours as needed for nausea or vomiting. 20 tablet 0  . pantoprazole (PROTONIX) 40 MG tablet Take 40 mg by mouth.    . promethazine (PHENERGAN) 25 MG  tablet Take 1 tablet (25 mg total) every 6 (six) hours as needed by mouth for nausea or vomiting. 10 tablet 0  . traMADol (ULTRAM) 50 MG tablet Take 1/2 (50m) to 1 (563m tab every 6 hours PRN pain. 30 tablet 0  . vitamin B-12 (CYANOCOBALAMIN) 500 MCG tablet Take 500 mcg by mouth daily.    . vitamin E 400 UNIT capsule Take by mouth.    . Marland Kitchentorvastatin (LIPITOR) 80 MG tablet Take 80 mg by mouth.    . potassium chloride SA (K-DUR,KLOR-CON) 20 MEQ tablet Take 1 tablet (20 mEq total) by mouth daily for 3 days. 3 tablet 0  . triamterene-hydrochlorothiazide (DYAZIDE) 37.5-25 MG capsule Take by mouth.     No current facility-administered medications for this visit.    Facility-Administered Medications Ordered in Other Visits  Medication Dose Route Frequency Provider Last Rate Last Dose  . sodium chloride flush (NS) 0.9 % injection 10 mL  10 mL Intravenous PRN CoLequita AsalMD   10 mL at 04/05/17 1034   Review of Systems  Constitutional: Negative for diaphoresis, fever, malaise/fatigue and weight loss.  HENT: Positive for hearing loss (Deaf). Negative for congestion, nosebleeds and sore throat.   Eyes: Negative.   Respiratory: Negative for cough, hemoptysis, sputum production and shortness of breath.   Cardiovascular: Negative for chest pain, palpitations, orthopnea, leg swelling and PND.  Gastrointestinal: Positive for diarrhea. Negative for abdominal pain, blood in stool, constipation, melena, nausea and vomiting.  Genitourinary: Negative for dysuria, frequency, hematuria and urgency.  Musculoskeletal: Positive for joint pain (hips and upper thighs; plain films show progressive OA). Negative for back pain, falls and myalgias.  Skin: Negative for itching and rash.  Neurological: Negative for dizziness, tremors, weakness and headaches.       Grade II neuropathy in her hands (right > left)  Endo/Heme/Allergies: Does not bruise/bleed easily.  Psychiatric/Behavioral: Negative for depression,  memory loss and suicidal ideas. The patient is not nervous/anxious and does not have insomnia.   All other systems reviewed and are negative.  Physical Exam:  Blood pressure (!) 157/85, pulse (!) 106, temperature (!) 96.7 F (35.9 C), temperature source Tympanic, resp. rate 18, weight 181 lb 4.8 oz (82.2 kg). GENERAL:  Elderly woman sitting comfortably in the exam room in no acute distress.  She needs assistance onto exam table. MENTAL STATUS:  Alert and oriented to person, place and time. HEAD:  Curly gray hair.  Normocephalic, atraumatic, face symmetric, no Cushingoid features. EYES:  Hazel eyes.  Pupils equal round and reactive to light and accomodation.  No conjunctivitis or scleral icterus. ENT:  Oropharynx clear without lesion.  Tongue normal. Mucous membranes moist.  RESPIRATORY:  Clear to auscultation without rales, wheezes or  rhonchi. CARDIOVASCULAR:  Regular rate and rhythm without murmur, rub or gallop. ABDOMEN:  Ventral hernia.  Soft, non-tender, with active bowel sounds, and no hepatosplenomegaly.  No masses. SKIN:  No rashes, ulcers or lesions. EXTREMITIES: No edema, no skin discoloration or tenderness.  No palpable cords. LYMPH NODES: No palpable cervical, supraclavicular, axillary or inguinal adenopathy  NEUROLOGICAL: Unremarkable. PSYCH:  Appropriate.    Results for orders placed or performed in visit on 12/06/17 (from the past 72 hour(s))  CBC with Differential     Status: Abnormal   Collection Time: 12/06/17  9:41 AM  Result Value Ref Range   WBC 5.6 3.6 - 11.0 K/uL   RBC 3.86 3.80 - 5.20 MIL/uL   Hemoglobin 11.2 (L) 12.0 - 16.0 g/dL   HCT 32.9 (L) 35.0 - 47.0 %   MCV 85.4 80.0 - 100.0 fL   MCH 29.0 26.0 - 34.0 pg   MCHC 33.9 32.0 - 36.0 g/dL   RDW 17.8 (H) 11.5 - 14.5 %   Platelets 451 (H) 150 - 440 K/uL   Neutrophils Relative % 49 %   Neutro Abs 2.8 1.4 - 6.5 K/uL   Lymphocytes Relative 22 %   Lymphs Abs 1.2 1.0 - 3.6 K/uL   Monocytes Relative 11 %    Monocytes Absolute 0.6 0.2 - 0.9 K/uL   Eosinophils Relative 17 %   Eosinophils Absolute 0.9 (H) 0 - 0.7 K/uL   Basophils Relative 1 %   Basophils Absolute 0.1 0 - 0.1 K/uL    Comment: Performed at [facility], [address] [place], [place] [address]  Basic metabolic panel     Status: Abnormal   Collection Time: 12/06/17  9:41 AM  Result Value Ref Range   Sodium 132 (L) 135 - 145 mmol/L   Potassium 3.3 (L) 3.5 - 5.1 mmol/L   Chloride 99 (L) 101 - 111 mmol/L   CO2 22 22 - 32 mmol/L   Glucose, Bld 118 (H) 65 - 99 mg/dL   BUN 21 (H) 6 - 20 mg/dL   Creatinine, Ser 1.48 (H) 0.44 - 1.00 mg/dL   Calcium 8.9 8.9 - 10.3 mg/dL   GFR calc non Af Amer 34 (L) >60 mL/min   GFR calc Af Amer 39 (L) >60 mL/min    Comment: (NOTE) The eGFR has been calculated using the CKD EPI equation. This calculation has not been validated in all clinical situations. eGFR's persistently <60 mL/min signify possible Chronic Kidney Disease.    Anion gap 11 5 - 15    Comment: Performed at [facility], [address], [place], [place] [address]    Radiology studies: 02/20/2016:  Chest, abdomen and pelvic CT revealed a 3.7 cm subpleural mass within the left lower lobe, a 1.1 cm right lower lobe subpleural nodule , 9 mm RUL nodule, and 0.4 cm lingular nodule.  Between the left second, third, and fourth ribs there was 1.9 cm soft tissue mass. There was a 2.5 cm low-attenuation nodule in the left lobe of the thyroid.   There were prominent left axillary nodes (up to 1.0 cm).   There was no definite mediastinal adenopathy. There was a 5.5 x 4.6 cm cystic lesion in the left hepatic lobe.  There was a 3.1 cm nodule in the right adrenal gland. There was a 1.2 cm right retrocaval lymph node with additional prominent subcentimeter retroperitoneal nodes. There was 1.0 cm left common iliac node. There was a 6.2 x 5.2 cm soft tissue mass  within the right ilium.  02/21/2016:  Head MRI revealed no evidence of  malignancy. Thyroid ultrasound on 02/21/2016 revealed a 2.4 cm solid nodule in the inferior aspect of the left lobe 03/03/2016:  PET scan revealed a hypermetabolic left lower lobe mass, bilateral pleural and pulmonary parenchymal nodular metastasis, a large pleural lesion invading the anterior left chest wall.   There was activity in the superior left ocular orbit.  There was a large lesion centered in the right iliac wing with soft tissue extension.  There were hypermetabolic right external iliac lymph nodes 06/06/2016:  PET scan revealed significant interval response to therapy.  There was significant decrease in size and FDG uptake associated with left lower lobe lung lesion.  There was resolution of left-sided pleural base metastasis and left axillary and anterior mediastinal nodal metastasis.  There was resolution of previous hypermetabolic soft tissue metastasis/nodal metastasis posterior to the IVC.  There was marked improvement in hypermetabolic metastasis involving the right iliac wing.  There was persistent right upper lobe lung nodule which exhibits intense radiotracer uptake which may represent a focus of metastatic disease or synchronous primary lung neoplasm. 06/01/2017:  PET scan revealed no new or progressive findings.  The 9 mm RUL pulmonary nodule remained hypermetabolic (SUV 51.7, previously 7.3) and no change in size.  The index lesion in the posterior left lower lobe was minimally smaller on CT imaging with very low level FDG uptake (SUV 1.6).  There was no change in the appearance at site of previous disease at the right iliac wing (SUV 1.9 compared to 2.6 previously). 01/29/2017:  PET scan revealed interval progression of hypermetabolic nodules in the right upper (13 mm compared to 9 mm; SUV 10) and left lower lobes (26 mm compared to 22 mm; SUV 7.5).  There was new hypermetabolic focus of activity along the anterior left pleura although no underlying pleural or lung mass was  evident. 09/18/2017:  PET scan revealed a mixed appearance, with marked improvement in the prior 2 pulmonary nodules; but with new Deauville 5 left common iliac lymph node enlargement; new Deauville 5 right paraspinal lesion; and new Deauville 4 left infrahilar nodal activity.  There was a focus of accentuated metabolic activity which had been present along the right lower submandibular gland but currently seemed to project over the subcutaneous tissues without CT correlate. Current maximum SUV 10.5, previously 9.7.  There was increased accentuated activity along the left lateral spurring at L2-3. While typically such activity is inflammatory due to spurring, this had increased significantly since the prior exam and merit surveillance. There was a new 8 by 5 mm right upper lobe pulmonary nodule, Deauville 2, which also merits surveillance.   Assessment:  Toni Griffith is a 77 y.o. female with stage IVB B-cell lymphoma, unclassifiable, with features intermediate between diffuse large B-cell lymphoma and classical Hodgkin's lymphoma ("gray zone" lymphoma).  She underwent right iliac wing bone/bone marrow biopsy on 03/08/2016.  Head MRI on 02/21/2016 revealed no evidence of malignancy. 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   There was activity in the superior left ocular orbit.  There was a large lesion centered in the right iliac wing with soft tissue extension.  There were hypermetabolic right external iliac lymph nodes  She was admitted  to [facility] in [place].  She had renal failure and hypercalcemia. She was treated with IVF, calcitonin and Zometa.The patient has a history of coronary artery disease s/p CABG in 1993.  She has no history  of heart failure.  She is deaf and requires an interpreter.  Echo on 04/04/2016 revealed an EF of 55-60%.  Hepatitis B and C testing was negative on 03/30/2016.   G6PD assay was normal.  She declined LP with MTX prophylaxis.  She received 6 cycles of mini-RCHOP (04/07/2016 - 07/21/2016).  She has had some transient fingertip numbness.  She received radiation to the right iliac wing,  from 10/11/2016 - 11/01/2016.  She received 3000 cGy over 3 weeks.  PET scan on 01/29/2017 revealed interval progression of hypermetabolic nodules in the right upper (13 mm compared to 9 mm; SUV 10) and left lower lobes (26 mm compared to 22 mm; SUV 7.5).  There was new hypermetabolic focus of activity along the anterior left pleura although no underlying pleural or lung mass was evident.  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  She underwent SBRT 03/26/2017 - 04/09/2017.  She received 5000 cGy in 5 fractions to the left lower lobe lesion.  She underwent SBRT to a RUL lesion from 05/23/2017 - 06/18/2017.  PET scan on 09/18/2017 revealed a mixed appearance, with marked improvement in the prior 2 pulmonary nodules; but with new Deauville 5 left common iliac lymph node enlargement; new Deauville 5 right paraspinal lesion; and new Deauville 4 left infrahilar nodal activity.  There was a focus of accentuated metabolic activity which had been present along the right lower submandibular gland but currently seemed to project over the subcutaneous tissues without CT correlate. Current maximum SUV 10.5, previously 9.7. There was increased accentuated activity along the left lateral spurring at L2-3. While typically such activity is inflammatory due to spurring, this had increased significantly since the prior exam and merit surveillance. There was a new 8 by 5 mm right upper lobe pulmonary nodule, Deauville 2.  CT guided right paraspinal lesion biopsy on 10/16/2017 was compatible  with involvement by the patient's previously diagnosed lymphoma.  The case was referred to [facility] hematopathology.  The neoplastic cells were largely CD30 negative.  Repeat CD30 immunohistochemistry faintly stained > 10% of the large atypical cells. CD20 stained the scattered large atypical cells and few small cells.   She is day 14 s/p cycle #1 brentuximab vedotin (11/23/2017).  She has a normocytic anemia.  Labs on 04/17/2016 revealed an elevated ferritin (444), iron saturation (11%), low TIBC (240), B12 (2409), and folate (10.8).  Symptomatically, she has pain in her hips and upper thighs. Plain films demonstrated progressive OA. 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  She notes recent diarrhea. She denies fevers.  Exam is stable.  Potassium is 3.3.    Plan: 1.    Labs today: CBC with diff, CMP, Mg, LDH, uric acid, B12, folate.  2.    Discuss diarrhea. Chemotherapy regimen carried a 25-30% chance of causing diarrhea. Will check stool studies (GI panel and C.diff).  If stool studies negative, patient will use loperamide PRN.  3.    Discuss pain control. Continues to have pain in hips and knees. She is using Tramadol 25 mg q6 PRN. Advised to increase to 50 mg q6 PRN.  4.    Discuss HYPOkalemia. Potassium is 3.3 today. Will increase oral KCl to 20 mEq  daily x 3 days.  5.    Discuss symptom management.  Patient has antiemetics at home to use on a PRN basis. Patient advising that the  prescribed interventions are adequate at this point. Continue all medications as previously prescribed.  6.    Discuss neutropenic precautions. Encouraged to avoid crouds and close contacts with sick individuals. Reviewed handwashing precautions.  7.    RTC on 12/14/2017 for MD assessment, labs (CBC with diff, CMP, Mg, LDH, uric acid), and cycle # 2 Adcetris.   ADDENDUM: C.diff negative. GI panel (+) for norovirus GI/GII.    Honor Loh, NP  12/06/2017, 10:23 AM   I saw and evaluated the patient,  participating in the key portions of the service and reviewing pertinent diagnostic studies and records.  I reviewed the nurse practitioner's note and agree with the findings and the plan.  The assessment and plan were discussed with the patient.  Additional diagnostic studies of plain films are needed to clarify hip pain and would change the clinical management.  Multiple questions were asked by the patient and answered.   Nolon Stalls, MD 12/06/2017,10:23 AM

## 2017-12-07 ENCOUNTER — Telehealth: Payer: Self-pay | Admitting: *Deleted

## 2017-12-07 NOTE — Telephone Encounter (Signed)
Patient's daughter called to clarify new K+ prescription.  Called her back to inform her that new rx is for 20 meq of K+ once a day for 3 days and then go back to original prescription.  Verbalized understanding.

## 2017-12-13 ENCOUNTER — Telehealth: Payer: Self-pay | Admitting: *Deleted

## 2017-12-13 NOTE — Telephone Encounter (Signed)
Called patient's daughter, Toni Griffith to inquire how her mother is feeling.  Last Friday, the patient came to the clinic with c/o nausea and diarrhea.  Stool cultures revealed noro-virus.  [name] states her mom is feeling much better.  She is eating well and having no diarrhea at this time.  I informed her that MD wants to reschedule her appointment for tomorrow, May 17 for one week to insure that the virus is completely resolved.  Daughter verbalized understanding.  Informed her that scheduling will reschedule for the following Friday and get in tough with her.

## 2017-12-14 ENCOUNTER — Telehealth: Payer: Self-pay | Admitting: *Deleted

## 2017-12-14 ENCOUNTER — Inpatient Hospital Stay: Payer: Medicare HMO

## 2017-12-14 ENCOUNTER — Inpatient Hospital Stay: Payer: Medicare HMO | Admitting: Urgent Care

## 2017-12-14 NOTE — Telephone Encounter (Signed)
Called patient's daughter, [name] to inform her that after looking @ the side effects of Adcentris, alopecia is listed as a less common side effect occurring in 10-29% of patients.   She verbalized understanding.

## 2017-12-14 NOTE — Telephone Encounter (Signed)
[name] called and reports that patient is losing her hair and that they had been told she should not lose her hair, she is asking for a return call. [phone]

## 2017-12-15 NOTE — Telephone Encounter (Signed)
  Please call patient.  Incidence is low regarding hair loss (approx 15%) with brentuximab vedotin.  M

## 2017-12-21 ENCOUNTER — Inpatient Hospital Stay: Payer: Medicare HMO

## 2017-12-21 ENCOUNTER — Encounter: Payer: Self-pay | Admitting: Hematology and Oncology

## 2017-12-21 ENCOUNTER — Inpatient Hospital Stay: Payer: Medicare HMO | Admitting: Hematology and Oncology

## 2017-12-21 VITALS — BP 124/82 | HR 88 | Temp 96.2°F | Resp 18 | Ht 63.0 in | Wt 182.3 lb

## 2017-12-21 DIAGNOSIS — C8518 Unspecified B-cell lymphoma, lymph nodes of multiple sites: Secondary | ICD-10-CM

## 2017-12-21 DIAGNOSIS — Z5112 Encounter for antineoplastic immunotherapy: Secondary | ICD-10-CM

## 2017-12-21 DIAGNOSIS — G620 Drug-induced polyneuropathy: Secondary | ICD-10-CM

## 2017-12-21 DIAGNOSIS — C8590 Non-Hodgkin lymphoma, unspecified, unspecified site: Secondary | ICD-10-CM

## 2017-12-21 DIAGNOSIS — M25552 Pain in left hip: Secondary | ICD-10-CM | POA: Diagnosis not present

## 2017-12-21 DIAGNOSIS — M25551 Pain in right hip: Secondary | ICD-10-CM

## 2017-12-21 DIAGNOSIS — Z7189 Other specified counseling: Secondary | ICD-10-CM

## 2017-12-21 DIAGNOSIS — T451X5A Adverse effect of antineoplastic and immunosuppressive drugs, initial encounter: Secondary | ICD-10-CM

## 2017-12-21 DIAGNOSIS — G893 Neoplasm related pain (acute) (chronic): Secondary | ICD-10-CM | POA: Diagnosis not present

## 2017-12-21 DIAGNOSIS — M25562 Pain in left knee: Secondary | ICD-10-CM

## 2017-12-21 DIAGNOSIS — G8929 Other chronic pain: Secondary | ICD-10-CM

## 2017-12-21 LAB — COMPREHENSIVE METABOLIC PANEL
ALT: 14 U/L (ref 14–54)
AST: 25 U/L (ref 15–41)
Albumin: 3.4 g/dL — ABNORMAL LOW (ref 3.5–5.0)
Alkaline Phosphatase: 73 U/L (ref 38–126)
Anion gap: 9 (ref 5–15)
BUN: 23 mg/dL — ABNORMAL HIGH (ref 6–20)
CO2: 23 mmol/L (ref 22–32)
Calcium: 9.1 mg/dL (ref 8.9–10.3)
Chloride: 100 mmol/L — ABNORMAL LOW (ref 101–111)
Creatinine, Ser: 1.18 mg/dL — ABNORMAL HIGH (ref 0.44–1.00)
GFR calc Af Amer: 51 mL/min — ABNORMAL LOW (ref >60)
GFR calc non Af Amer: 44 mL/min — ABNORMAL LOW (ref >60)
Glucose, Bld: 118 mg/dL — ABNORMAL HIGH (ref 65–99)
Potassium: 3.6 mmol/L (ref 3.5–5.1)
Sodium: 132 mmol/L — ABNORMAL LOW (ref 135–145)
Total Bilirubin: 1 mg/dL (ref 0.3–1.2)
Total Protein: 6.7 g/dL (ref 6.5–8.1)

## 2017-12-21 LAB — CBC WITH DIFFERENTIAL/PLATELET
Basophils Absolute: 0.1 10*3/uL (ref 0–0.1)
Basophils Relative: 1 %
Eosinophils Absolute: 0.2 10*3/uL (ref 0–0.7)
Eosinophils Relative: 3 %
HCT: 33.3 % — ABNORMAL LOW (ref 35.0–47.0)
Hemoglobin: 11.1 g/dL — ABNORMAL LOW (ref 12.0–16.0)
Lymphocytes Relative: 17 %
Lymphs Abs: 1.6 10*3/uL (ref 1.0–3.6)
MCH: 29.1 pg (ref 26.0–34.0)
MCHC: 33.4 g/dL (ref 32.0–36.0)
MCV: 87.2 fL (ref 80.0–100.0)
Monocytes Absolute: 1.4 10*3/uL — ABNORMAL HIGH (ref 0.2–0.9)
Monocytes Relative: 15 %
Neutro Abs: 6 10*3/uL (ref 1.4–6.5)
Neutrophils Relative %: 64 %
Platelets: 328 10*3/uL (ref 150–440)
RBC: 3.82 MIL/uL (ref 3.80–5.20)
RDW: 19 % — ABNORMAL HIGH (ref 11.5–14.5)
WBC: 9.4 10*3/uL (ref 3.6–11.0)

## 2017-12-21 LAB — LACTATE DEHYDROGENASE: LDH: 119 U/L (ref 98–192)

## 2017-12-21 LAB — MAGNESIUM: Magnesium: 1.7 mg/dL (ref 1.7–2.4)

## 2017-12-21 LAB — URIC ACID: Uric Acid, Serum: 4.3 mg/dL (ref 2.3–6.6)

## 2017-12-21 MED ORDER — ACYCLOVIR 5 % EX CREA: 1.0000 "application " | TOPICAL_CREAM | CUTANEOUS | 0 refills | Status: DC

## 2017-12-21 MED ORDER — SODIUM CHLORIDE 0.9% FLUSH
10.0000 mL | INTRAVENOUS | Status: DC | PRN
  Administered 2017-12-21: 10 mL via INTRAVENOUS
  Filled 2017-12-21: qty 10

## 2017-12-21 MED ORDER — DEXAMETHASONE SODIUM PHOSPHATE 10 MG/ML IJ SOLN
10.0000 mg | Freq: Once | INTRAMUSCULAR | Status: AC
  Administered 2017-12-21: 10 mg via INTRAVENOUS
  Filled 2017-12-21: qty 1

## 2017-12-21 MED ORDER — DIPHENHYDRAMINE HCL 50 MG/ML IJ SOLN
25.0000 mg | Freq: Once | INTRAMUSCULAR | Status: AC
  Administered 2017-12-21: 25 mg via INTRAVENOUS
  Filled 2017-12-21: qty 1

## 2017-12-21 MED ORDER — HEPARIN SOD (PORK) LOCK FLUSH 100 UNIT/ML IV SOLN
500.0000 [IU] | Freq: Once | INTRAVENOUS | Status: AC
  Administered 2017-12-21: 500 [IU] via INTRAVENOUS
  Filled 2017-12-21: qty 5

## 2017-12-21 MED ORDER — SODIUM CHLORIDE 0.9 % IV SOLN
Freq: Once | INTRAVENOUS | Status: AC
  Administered 2017-12-21: 10:00:00 via INTRAVENOUS
  Filled 2017-12-21: qty 1000

## 2017-12-21 MED ORDER — ACETAMINOPHEN 325 MG PO TABS
650.0000 mg | ORAL_TABLET | Freq: Once | ORAL | Status: AC
  Administered 2017-12-21: 650 mg via ORAL
  Filled 2017-12-21: qty 2

## 2017-12-21 MED ORDER — HYDROCODONE-ACETAMINOPHEN 5-325 MG PO TABS: 1.0000 | ORAL_TABLET | Freq: Four times a day (QID) | ORAL | 0 refills | Status: DC | PRN

## 2017-12-21 MED ORDER — SODIUM CHLORIDE 0.9 % IV SOLN
150.0000 mg | Freq: Once | INTRAVENOUS | Status: AC
Start: 2017-12-21 — End: 2017-12-21
  Administered 2017-12-21: 150 mg via INTRAVENOUS
  Filled 2017-12-21: qty 30

## 2017-12-21 NOTE — Progress Notes (Signed)
[facility] day:  12/21/2017   Chief Complaint: Toni Griffith is a 77 y.o. female with stage IVB gray zone lymphoma who is seen for assessment prior to cycle #2 brentuximab vedotin (Adcetris).  HPI:  The patient was last seen in the medical oncology clinic on 12/06/2017.  At that time, she was seen on day 14 s/p cycle #1 brentuximab vedotin.   She had pain in her hips and upper thighs. 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  She noted recent diarrhea.  She denied fevers.  Exam was stable.  Potassium was 3.3.    She was prescribed oral potassium x 3 days.   C.diff was negative. GI panel was (+) for norovirus GI/GII.   During the interim, she contacted the clinic regarding hair loss (incidence 15% with brentuximab vedotin). Patient is feeling "good". Her diarrhea has resolved. She denies nausea, and vomiting. Neuropathy persists in the tips of her fingers. Patient notes that she has difficulties with fine motor skills. She encounters difficulties picking up small objects. She can still manipulate buttons. She has a mouth sore.   Patient is eating well. Weight is up 1 pound.   Pain rated 7/10 in her hips and knees.    Past Medical History:  Diagnosis Date  . Arthritis   . Cancer [facility])    lymphoma-right hip  . Deaf   . Hypertension     Past Surgical History:  Procedure Laterality Date  . CARDIAC SURGERY  1993  . CORONARY ARTERY BYPASS GRAFT    . CYST REMOVAL HAND    . PERIPHERAL VASCULAR CATHETERIZATION N/A 04/06/2016   Procedure: Glori Luis Cath Insertion;  Surgeon: Algernon Huxley, MD;  Location: [facility];  Service: Cardiovascular;  Laterality: N/A;    Family History  Problem Relation Age of Onset  . Lung cancer Mother   . Lung cancer Father   . Diabetes Maternal Uncle   . Myasthenia gravis Maternal Grandmother   . Leukemia Maternal Grandfather     Social History:   reports that she quit smoking about 2 years ago. She has never used smokeless tobacco. She reports that she drinks alcohol. She reports that she does not use drugs.  She smoked 1 1/2 packs per day for 20 years (30 pack year smoking history) until 10/2015.  She is hearing impaired (deaf).  She lives in [place] with her boyfriend.  Her daughter lives in [place].  Her daughter, Toni Griffith's contact number is [phone].  The patient is accompanied boyfriend, son, and ASL interpreter.   Allergies: No Known Allergies  Current Medications: Current Outpatient Medications  Medication Sig Dispense Refill  . allopurinol (ZYLOPRIM) 300 MG tablet TAKE 1 TABLET BY MOUTH EVERY DAY 30 tablet 3  . aspirin 325 MG tablet Take 325 mg by mouth daily.    Marland Kitchen atorvastatin (LIPITOR) 80 MG tablet Take 80 mg by mouth.    . B Complex Vitamins (B COMPLEX 1 PO) Take by mouth.     . celecoxib (CELEBREX) 200 MG capsule     . Cholecalciferol (VITAMIN D3) 2000 units capsule Take 2,000 Units by mouth daily.    Marland Kitchen FLUoxetine (PROZAC) 40 MG capsule     . gabapentin (NEURONTIN) 100 MG capsule Take 1 capsule (100 mg total) by mouth 2 (two) times daily. 60 capsule 3  . pantoprazole (PROTONIX) 40 MG tablet Take 40 mg by mouth.    Marland Kitchen  potassium chloride SA (K-DUR,KLOR-CON) 20 MEQ tablet Take 1 tablet (20 mEq total) by mouth daily. Then as directed by oncology office. 10 tablet 0  . traMADol (ULTRAM) 50 MG tablet Take 1/2 (34m) to 1 (554m tab every 6 hours PRN pain. 30 tablet 0  . vitamin B-12 (CYANOCOBALAMIN) 500 MCG tablet Take 500 mcg by mouth daily.    . vitamin E 400 UNIT capsule Take by mouth.    . Marland Kitchencetaminophen (TYLENOL) 325 MG tablet Take 650 mg by mouth.    . Marland Kitchencyclovir cream (ZOVIRAX) 5 % Apply 1 application topically every 4 (four) hours. 5 g 0  . lidocaine-prilocaine (EMLA) cream Apply cream 1 hour before chemotherapy treatment and place small peive of saran wrap over cream to protect clothing (Patient not taking: Reported on  12/21/2017) 30 g 1  . naproxen sodium (ANAPROX) 220 MG tablet Take 220 mg by mouth as needed.    . ondansetron (ZOFRAN ODT) 4 MG disintegrating tablet Take 1 tablet (4 mg total) by mouth every 8 (eight) hours as needed for nausea or vomiting. (Patient not taking: Reported on 12/21/2017) 20 tablet 0  . potassium chloride SA (K-DUR,KLOR-CON) 20 MEQ tablet Take 1 tablet (20 mEq total) by mouth daily for 3 days. 3 tablet 0  . promethazine (PHENERGAN) 25 MG tablet Take 1 tablet (25 mg total) every 6 (six) hours as needed by mouth for nausea or vomiting. (Patient not taking: Reported on 12/21/2017) 10 tablet 0  . triamterene-hydrochlorothiazide (DYAZIDE) 37.5-25 MG capsule Take by mouth.     No current facility-administered medications for this visit.    Facility-Administered Medications Ordered in Other Visits  Medication Dose Route Frequency Provider Last Rate Last Dose  . heparin lock flush 100 unit/mL  500 Units Intravenous Once Jazmen Lindenbaum C, MD      . sodium chloride flush (NS) 0.9 % injection 10 mL  10 mL Intravenous PRN CoNolon Stalls, MD   10 mL at 04/05/17 1034  . sodium chloride flush (NS) 0.9 % injection 10 mL  10 mL Intravenous PRN CoLequita AsalMD   10 mL at 12/21/17 0841    Review of Systems  Constitutional: Negative for diaphoresis, fever, malaise/fatigue and weight loss (up 1 pound).  HENT: Positive for hearing loss (Deaf). Negative for congestion, nosebleeds and sore throat.   Eyes: Negative.  Negative for blurred vision, double vision, pain, discharge and redness.  Respiratory: Negative.  Negative for cough, hemoptysis, sputum production and shortness of breath.   Cardiovascular: Negative.  Negative for chest pain, palpitations, orthopnea, leg swelling and PND.  Gastrointestinal: Negative.  Negative for abdominal pain, blood in stool, constipation, diarrhea (resolved), melena, nausea and vomiting.  Genitourinary: Negative.  Negative for dysuria, frequency, hematuria  and urgency.  Musculoskeletal: Positive for joint pain (hips; left knee). Negative for back pain, falls and myalgias.  Skin: Negative for itching and rash.       Chemotherapy induced very mild hair loss  Neurological: Negative for dizziness, tremors, weakness and headaches.       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  Does not bruise/bleed easily.  Psychiatric/Behavioral: Negative for depression, memory loss and suicidal ideas. The patient is not nervous/anxious and does not have insomnia.   All other systems reviewed and are negative.  Performance status (ECOG): 2 - Symptomatic, <50% confined to bed  Physical Exam:  Blood pressure 124/82, pulse 88, temperature (!) 96.2 F (35.7 C), temperature source Tympanic, resp. rate  18, height _0  (1.6 m), weight 182 lb 4.8 oz (82.7 kg), SpO2 96 %. GENERAL:  Elderly woman sitting comfortably in the exam room in no acute distress.  She needs assistance onto the exam table. MENTAL STATUS:  Alert and oriented to person, place and time. HEAD:  Curly gray hair.  Normocephalic, atraumatic, face symmetric, no Cushingoid features. EYES:  Hazel eyes.  Pupils equal round and reactive to light and accomodation.  No conjunctivitis or scleral icterus. ENT:  Oropharynx clear without lesion.  Tongue normal. Mucous membranes moist.  RESPIRATORY:  Clear to auscultation without rales, wheezes or rhonchi. CARDIOVASCULAR:  Regular rate and rhythm without murmur, rub or gallop. ABDOMEN:  Ventral hernia.  Soft, non-tender, with active bowel sounds, and no hepatosplenomegaly.  No masses. SKIN:  No rashes, ulcers or lesions. EXTREMITIES: No edema, no skin discoloration or tenderness.  No palpable cords. LYMPH NODES: No palpable cervical, supraclavicular, axillary or inguinal adenopathy  NEUROLOGICAL: Unremarkable. PSYCH:  Appropriate.    Radiology studies: 02/20/2016:  Chest, abdomen and pelvic CT revealed a 3.7 cm subpleural  mass within the left lower lobe, a 1.1 cm right lower lobe subpleural nodule , 9 mm RUL nodule, and 0.4 cm lingular nodule.  Between the left second, third, and fourth ribs there was 1.9 cm soft tissue mass. There was a 2.5 cm low-attenuation nodule in the left lobe of the thyroid.   There were prominent left axillary nodes (up to 1.0 cm).   There was no definite mediastinal adenopathy. There was a 5.5 x 4.6 cm cystic lesion in the left hepatic lobe.  There was a 3.1 cm nodule in the right adrenal gland. There was a 1.2 cm right retrocaval lymph node with additional prominent subcentimeter retroperitoneal nodes. There was 1.0 cm left common iliac node. There was a 6.2 x 5.2 cm soft tissue mass within the right ilium.  02/21/2016:  Head MRI revealed no evidence of malignancy. Thyroid ultrasound on 02/21/2016 revealed a 2.4 cm solid nodule in the inferior aspect of the left lobe 03/03/2016:  PET scan revealed a hypermetabolic left lower lobe mass, bilateral pleural and pulmonary parenchymal nodular metastasis, a large pleural lesion invading the anterior left chest wall.   There was activity in the superior left ocular orbit.  There was a large lesion centered in the right iliac wing with soft tissue extension.  There were hypermetabolic right external iliac lymph nodes 06/06/2016:  PET scan revealed significant interval response to therapy.  There was significant decrease in size and FDG uptake associated with left lower lobe lung lesion.  There was resolution of left-sided pleural base metastasis and left axillary and anterior mediastinal nodal metastasis.  There was resolution of previous hypermetabolic soft tissue metastasis/nodal metastasis posterior to the IVC.  There was marked improvement in hypermetabolic metastasis involving the right iliac wing.  There was persistent right upper lobe lung nodule which exhibits intense radiotracer uptake which may represent a focus of metastatic disease or synchronous  primary lung neoplasm. 06/01/2017:  PET scan revealed no new or progressive findings.  The 9 mm RUL pulmonary nodule remained hypermetabolic (SUV 35.5, previously 7.3) and no change in size.  The index lesion in the posterior left lower lobe was minimally smaller on CT imaging with very low level FDG uptake (SUV 1.6).  There was no change in the appearance at site of previous disease at the right iliac wing (SUV 1.9 compared to 2.6 previously). 01/29/2017:  PET scan revealed interval progression of hypermetabolic  nodules in the right upper (13 mm compared to 9 mm; SUV 10) and left lower lobes (26 mm compared to 22 mm; SUV 7.5).  There was new hypermetabolic focus of activity along the anterior left pleura although no underlying pleural or lung mass was evident. 09/18/2017:  PET scan revealed a mixed appearance, with marked improvement in the prior 2 pulmonary nodules; but with new Deauville 5 left common iliac lymph node enlargement; new Deauville 5 right paraspinal lesion; and new Deauville 4 left infrahilar nodal activity.  There was a focus of accentuated metabolic activity which had been present along the right lower submandibular gland but currently seemed to project over the subcutaneous tissues without CT correlate. Current maximum SUV 10.5, previously 9.7.  There was increased accentuated activity along the left lateral spurring at L2-3. While typically such activity is inflammatory due to spurring, this had increased significantly since the prior exam and merit surveillance. There was a new 8 by 5 mm right upper lobe pulmonary nodule, Deauville 2, which also merits surveillance.   Assessment:  Suellyn Meenan is a 77 y.o. female with stage IVB B-cell lymphoma, unclassifiable, with features intermediate between diffuse large B-cell lymphoma and classical Hodgkin's lymphoma ("gray zone" lymphoma).  She underwent right iliac wing bone/bone marrow biopsy on 03/08/2016.  Head MRI on 02/21/2016 revealed no  evidence of malignancy. 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   There was activity in the superior left ocular orbit.  There was a large lesion centered in the right iliac wing with soft tissue extension.  There were hypermetabolic right external iliac lymph nodes  She was admitted to [facility].  She had renal failure and hypercalcemia. She was treated with IVF, calcitonin and Zometa.The patient has a history of coronary artery disease s/p CABG in 1993.  She has no history of heart failure.  She is deaf and requires an interpreter.  Echo on 04/04/2016 revealed an EF of 55-60%.  Hepatitis B and C testing was negative on 03/30/2016.   G6PD assay was normal.  She declined LP with MTX prophylaxis.  She received 6 cycles of mini-RCHOP (04/07/2016 - 07/21/2016).  She has had some transient fingertip numbness.  She received radiation to the right iliac wing,  from 10/11/2016 - 11/01/2016.  She received 3000 cGy over 3 weeks.  PET scan on 01/29/2017 revealed interval progression of hypermetabolic nodules in the right upper (13 mm compared to 9 mm; SUV 10) and left lower lobes (26 mm compared to 22 mm; SUV 7.5).  There was new hypermetabolic focus of activity along the anterior left pleura although no underlying pleural or lung mass was evident.  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  She underwent SBRT 03/26/2017 - 04/09/2017.  She received 5000 cGy in 5 fractions to the left lower lobe lesion.  She underwent SBRT to a RUL lesion from 05/23/2017 - 06/18/2017.  PET scan on 09/18/2017 revealed a mixed appearance, with marked improvement in the prior 2 pulmonary nodules; but with  new Deauville 5 left common iliac lymph node enlargement; new Deauville 5 right paraspinal lesion; and new Deauville 4 left infrahilar nodal activity.  There was a focus of accentuated metabolic activity which had  been present along the right lower submandibular gland but currently seemed to project over the subcutaneous tissues without CT correlate. Current maximum SUV 10.5, previously 9.7. There was increased accentuated activity along the left lateral spurring at L2-3. While typically such activity is inflammatory due to spurring, this had increased significantly since the prior exam and merit surveillance. There was a new 8 by 5 mm right upper lobe pulmonary nodule, Deauville 2.  CT guided right paraspinal lesion biopsy on 10/16/2017 was compatible with involvement by the patient's previously diagnosed lymphoma.  The case was referred to [facility] hematopathology.  The neoplastic cells were largely CD30 negative.  Repeat CD30 immunohistochemistry faintly stained > 10% of the large atypical cells. CD20 stained the scattered large atypical cells and few small cells.   She is day 29 s/p cycle #1 brentuximab vedotin (11/23/2017).  She has a normocytic anemia.  Labs on 04/17/2016 revealed an elevated ferritin (444), iron saturation (11%), low TIBC (240), B12 (2409), and folate (10.8).  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  B12 and folate were normal on 12/06/2017.  She was diagnosed with norovirus on 12/06/2017.  She presented with diarrhea.  Diarrhea has resolved.  Symptomatically, patient is doing well.  She has pain in her hips and left knee. 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  She denies fevers.  Exam is stable.    Plan: 1. Labs today: CBC with diff, CMP, Mg, LDH, uric acid.  2. Discuss interval norovirus.  Symptoms have resolved. 3. Begin cycle #2 brentuximab vedotin. 4. Discuss pain control. Continues to have pain in hips and knees. She is using Tramadol  50 mg q6 PRN, but it is not working. Discontinue Tramadol.   Given a current oncological diagnosis, this patient has the potential to experience significant cancer related pain. Pain 7/10 in clinic today. Benefits versus risks associated with  therapy considered. Will pursue short term pain management with opioids. Patient educated that medications should not be bitten, chewed, or crushed. Additionally, safety precautions reviewed. Patient verbalized understanding that medications should not be sold or shared, taken with alcohol, or used while driving.   Will send Rx for Norco 5/325 mg q6 PRN (Disp #30).  5. Discuss symptom management.  Patient has antiemetics at home to use on a PRN basis. Patient advising that the  prescribed interventions are adequate at this point. Continue all medications as previously prescribed.  6. Discuss neutropenic precautions. Encouraged to avoid crowds and close contacts with sick individuals. Reviewed handwashing precautions.  7. RTC on 01/11/2018 for MD assessment, labs (CBC with diff, CMP, Mg, LDH, uric acid), and cycle #3 Adcetris.    Honor Loh, NP  12/21/2017, 9:36 AM   I saw and evaluated the patient, participating in the key portions of the service and reviewing pertinent diagnostic studies and records.  I reviewed the nurse practitioner's note and agree with the findings and the plan.  The assessment and plan were discussed with the patient.  Multiple questions were asked by the patient and answered.   Nolon Stalls, MD 12/21/2017,9:36 AM

## 2017-12-23 DIAGNOSIS — M25562 Pain in left knee: Secondary | ICD-10-CM

## 2017-12-23 DIAGNOSIS — G8929 Other chronic pain: Secondary | ICD-10-CM | POA: Insufficient documentation

## 2017-12-28 ENCOUNTER — Ambulatory Visit: Payer: Medicare HMO

## 2017-12-28 ENCOUNTER — Ambulatory Visit: Payer: Medicare HMO | Admitting: Hematology and Oncology

## 2017-12-28 ENCOUNTER — Other Ambulatory Visit: Payer: Medicare HMO

## 2017-12-28 ENCOUNTER — Telehealth: Payer: Self-pay | Admitting: *Deleted

## 2017-12-28 NOTE — Telephone Encounter (Signed)
Patient's daughter called to inquire about future appointments.  Explained to her that all future appointments are listed on AVS that is given to them at the end of her last visit.

## 2018-01-01 ENCOUNTER — Encounter: Payer: Self-pay | Admitting: Nurse Practitioner

## 2018-01-01 ENCOUNTER — Inpatient Hospital Stay: Payer: Medicare HMO | Attending: Nurse Practitioner

## 2018-01-01 ENCOUNTER — Inpatient Hospital Stay: Payer: Medicare HMO

## 2018-01-01 ENCOUNTER — Other Ambulatory Visit: Payer: Self-pay

## 2018-01-01 ENCOUNTER — Telehealth: Payer: Self-pay | Admitting: *Deleted

## 2018-01-01 ENCOUNTER — Inpatient Hospital Stay (HOSPITAL_BASED_OUTPATIENT_CLINIC_OR_DEPARTMENT_OTHER): Payer: Medicare HMO | Admitting: Nurse Practitioner

## 2018-01-01 VITALS — BP 159/90 | HR 98 | Temp 98.7°F

## 2018-01-01 DIAGNOSIS — Z87891 Personal history of nicotine dependence: Secondary | ICD-10-CM | POA: Diagnosis not present

## 2018-01-01 DIAGNOSIS — Z5112 Encounter for antineoplastic immunotherapy: Secondary | ICD-10-CM | POA: Insufficient documentation

## 2018-01-01 DIAGNOSIS — R197 Diarrhea, unspecified: Secondary | ICD-10-CM | POA: Insufficient documentation

## 2018-01-01 DIAGNOSIS — D649 Anemia, unspecified: Secondary | ICD-10-CM | POA: Insufficient documentation

## 2018-01-01 DIAGNOSIS — R531 Weakness: Secondary | ICD-10-CM | POA: Diagnosis not present

## 2018-01-01 DIAGNOSIS — Z7982 Long term (current) use of aspirin: Secondary | ICD-10-CM

## 2018-01-01 DIAGNOSIS — F329 Major depressive disorder, single episode, unspecified: Secondary | ICD-10-CM | POA: Diagnosis not present

## 2018-01-01 DIAGNOSIS — R21 Rash and other nonspecific skin eruption: Secondary | ICD-10-CM | POA: Diagnosis not present

## 2018-01-01 DIAGNOSIS — R110 Nausea: Secondary | ICD-10-CM

## 2018-01-01 DIAGNOSIS — I951 Orthostatic hypotension: Secondary | ICD-10-CM | POA: Insufficient documentation

## 2018-01-01 DIAGNOSIS — E860 Dehydration: Secondary | ICD-10-CM

## 2018-01-01 DIAGNOSIS — R634 Abnormal weight loss: Secondary | ICD-10-CM | POA: Diagnosis not present

## 2018-01-01 DIAGNOSIS — C8518 Unspecified B-cell lymphoma, lymph nodes of multiple sites: Secondary | ICD-10-CM | POA: Insufficient documentation

## 2018-01-01 DIAGNOSIS — W19XXXA Unspecified fall, initial encounter: Secondary | ICD-10-CM

## 2018-01-01 DIAGNOSIS — Z79899 Other long term (current) drug therapy: Secondary | ICD-10-CM | POA: Diagnosis not present

## 2018-01-01 DIAGNOSIS — E876 Hypokalemia: Secondary | ICD-10-CM | POA: Diagnosis not present

## 2018-01-01 DIAGNOSIS — Z95828 Presence of other vascular implants and grafts: Secondary | ICD-10-CM

## 2018-01-01 LAB — CBC WITH DIFFERENTIAL/PLATELET
BASOS ABS: 0.1 10*3/uL (ref 0–0.1)
BASOS PCT: 1 %
EOS ABS: 0.1 10*3/uL (ref 0–0.7)
EOS PCT: 2 %
HEMATOCRIT: 34.5 % — AB (ref 35.0–47.0)
Hemoglobin: 11.8 g/dL — ABNORMAL LOW (ref 12.0–16.0)
Lymphocytes Relative: 21 %
Lymphs Abs: 1.3 10*3/uL (ref 1.0–3.6)
MCH: 29.4 pg (ref 26.0–34.0)
MCHC: 34.1 g/dL (ref 32.0–36.0)
MCV: 86.1 fL (ref 80.0–100.0)
MONO ABS: 0.6 10*3/uL (ref 0.2–0.9)
MONOS PCT: 10 %
NEUTROS ABS: 4.1 10*3/uL (ref 1.4–6.5)
Neutrophils Relative %: 66 %
PLATELETS: 423 10*3/uL (ref 150–440)
RBC: 4 MIL/uL (ref 3.80–5.20)
RDW: 18.4 % — AB (ref 11.5–14.5)
WBC: 6.3 10*3/uL (ref 3.6–11.0)

## 2018-01-01 LAB — COMPREHENSIVE METABOLIC PANEL
ALT: 40 U/L (ref 14–54)
ANION GAP: 12 (ref 5–15)
AST: 49 U/L — ABNORMAL HIGH (ref 15–41)
Albumin: 3.9 g/dL (ref 3.5–5.0)
Alkaline Phosphatase: 73 U/L (ref 38–126)
BUN: 20 mg/dL (ref 6–20)
CHLORIDE: 96 mmol/L — AB (ref 101–111)
CO2: 21 mmol/L — AB (ref 22–32)
CREATININE: 1.13 mg/dL — AB (ref 0.44–1.00)
Calcium: 9.6 mg/dL (ref 8.9–10.3)
GFR calc Af Amer: 54 mL/min — ABNORMAL LOW (ref >60)
GFR, EST NON AFRICAN AMERICAN: 47 mL/min — AB (ref >60)
Glucose, Bld: 140 mg/dL — ABNORMAL HIGH (ref 65–99)
POTASSIUM: 3.7 mmol/L (ref 3.5–5.1)
SODIUM: 129 mmol/L — AB (ref 135–145)
Total Bilirubin: 1.7 mg/dL — ABNORMAL HIGH (ref 0.3–1.2)
Total Protein: 7.5 g/dL (ref 6.5–8.1)

## 2018-01-01 LAB — MAGNESIUM: Magnesium: 1.9 mg/dL (ref 1.7–2.4)

## 2018-01-01 LAB — SAMPLE TO BLOOD BANK

## 2018-01-01 MED ORDER — ONDANSETRON HCL 4 MG/2ML IJ SOLN
INTRAMUSCULAR | Status: AC
  Filled 2018-01-01: qty 2

## 2018-01-01 MED ORDER — ONDANSETRON HCL 4 MG/2ML IJ SOLN
4.0000 mg | Freq: Once | INTRAMUSCULAR | Status: AC
  Administered 2018-01-01: 4 mg via INTRAVENOUS

## 2018-01-01 MED ORDER — SODIUM CHLORIDE 0.9 % IV SOLN
Freq: Once | INTRAVENOUS | Status: AC
Start: 2018-01-01 — End: 2018-01-01
  Administered 2018-01-01: 16:00:00 via INTRAVENOUS
  Filled 2018-01-01: qty 1000

## 2018-01-01 MED ORDER — HEPARIN SOD (PORK) LOCK FLUSH 100 UNIT/ML IV SOLN
500.0000 [IU] | Freq: Once | INTRAVENOUS | Status: AC
  Administered 2018-01-01: 500 [IU] via INTRAVENOUS
  Filled 2018-01-01: qty 5

## 2018-01-01 MED ORDER — SODIUM CHLORIDE 0.9% FLUSH
10.0000 mL | INTRAVENOUS | Status: DC | PRN
  Administered 2018-01-01: 10 mL via INTRAVENOUS
  Filled 2018-01-01: qty 10

## 2018-01-01 NOTE — Progress Notes (Signed)
Symptom Management [place]  Telephone:(336) [phone] Fax:(336) [phone]  Patient Care Team: Toni Griffith., MD as PCP - General (Internal Medicine) Toni Asal, MD as Medical Oncologist (Hematology and Oncology)   Name of the patient: Toni Griffith  [id number]  1940/10/22   Date of visit: 01/01/18  Diagnosis- [place]  Chief complaint/ Reason for visit- Weakness   Heme/Onc history:  Oncology History   Patient has history of stage IVB B-cell lymphoma, unclassifiable, with features intermediate between diffuse large B-cell lymphoma and classical Hodgkin's lymphoma ("gray zone" lymphoma).  She underwent right iliac wing bone/bone marrow biopsy on 03/08/2016.  Head MRI on 02/21/2016 revealed no evidence of malignancy. 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   There was activity in the superior left ocular orbit.  There was a large lesion centered in the right iliac wing with soft tissue extension.  There were hypermetabolic right external iliac lymph nodes  She was admitted to [facility] in [place].  She had renal failure and hypercalcemia. She was treated with IVF, calcitonin and Zometa.The patient has a history of coronary artery disease s/p CABG in 1993.  She has no history of heart failure.  She is deaf and requires an interpreter.  Echo on 04/04/2016 revealed an EF of 55-60%.  Hepatitis B and C testing was negative on 03/30/2016.   G6PD assay was normal.  She declined LP with MTX prophylaxis.  She received 6 cycles of mini-RCHOP (04/07/2016 - 07/21/2016).  She has had some transient fingertip numbness.  She received radiationto the right iliac wing,  from 10/11/2016 - 11/01/2016. She received 3000 cGy over 3 weeks.  PET scan on 01/29/2017 revealed interval progression of hypermetabolic nodules in the right upper (13 mm compared to 9 mm; SUV 10) and left lower lobes (26 mm compared to 22 mm; SUV 7.5).  There was new hypermetabolic focus of activity along the anterior left pleura although no underlying pleural or lung mass was evident.  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  She underwent SBRT 03/26/2017 - 04/09/2017.  She received 5000 cGy in 5 fractions to the left lower lobe lesion.  She underwent SBRT to a RUL lesion from 05/23/2017 - 06/18/2017.  PET scan on 09/18/2017 revealed a mixed appearance, with marked improvement in the prior 2 pulmonary nodules; but with new Deauville 5 left common iliac lymph node enlargement; new Deauville 5 right paraspinal lesion; and new Deauville 4 left infrahilar nodal activity.  There was a focus of accentuated metabolic activity which had been present along the right lower submandibular gland but currently seemed to project over the subcutaneous tissues without CT correlate. Current maximum SUV 10.5, previously 9.7. There was increased accentuated activity along the left lateral spurring at L2-3. While typically such activity is inflammatory due to spurring, this had increased significantly since the prior exam and merit surveillance. There was a new 8 by 5 mm right upper lobe pulmonary nodule, Deauville 2.  CT guided right paraspinal lesion biopsy on 10/16/2017 was compatible with involvement by the patient's previously diagnosed lymphoma.  The case was referred to [facility] hematopathology.  The neoplastic cells were largely CD30 negative.  Repeat CD30 immunohistochemistry  faintly stained > 10% of the large atypical cells. CD20 stained the scattered large atypical cells and few small cells.   She initiated brentuximab vedotin/Adcetris on 11/23/17.   She has a  normocytic anemia.  Labs on 04/17/2016 revealed an elevated ferritin (444), iron saturation (11%), low TIBC (240), B12 (2409), and folate (10.8).  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  B12 and folate were normal on 12/06/2017.      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[place])   11/01/2017 Initial Diagnosis    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[place])      11/04/2017 -  Chemotherapy    The patient had brentuximab vedotin (ADCETRIS) 150 mg in sodium chloride 0.9 % 100 mL chemo infusion, 155 mg, Intravenous,  Once, 2 of 4 cycles Administration: 150 mg (11/23/2017), 150 mg (12/21/2017)  for chemotherapy treatment.        Interval history- Toni Griffith presents with a report of weakness. Symptoms have been gradually worsening since her last treatment. Overnight she was ambulating to bathroom and felt her 'knee give out' while trying to sit on the toilet. She endorses a history of osteoarthritis of the left hip and is chronically weak. Associated symptoms: dizziness when changing positions. She did take her BP medicine this morning. Appetite has been reduced. Feels she's drinking normally but 'maybe not enough'.    ECOG FS:2 - Symptomatic, <50% confined to bed  Review of systems- Review of Systems  Constitutional: Positive for weight loss. Negative for chills, fever and malaise/fatigue.  HENT: Positive for hearing loss (Deaf). Negative for congestion, ear discharge, ear pain, sinus pain, sore throat and tinnitus.   Eyes: Negative.   Respiratory: Negative.  Negative for cough, sputum  production and shortness of breath.   Cardiovascular: Negative for chest pain, palpitations, orthopnea, claudication and leg swelling.  Gastrointestinal: Negative for abdominal pain, blood in stool, constipation, diarrhea, heartburn, nausea and vomiting.  Genitourinary: Negative.   Musculoskeletal: Positive for falls and joint pain (left hip (chronic and unchanged)).  Skin: Negative.   Neurological: Positive for weakness. Negative for dizziness, tingling and headaches.  Endo/Heme/Allergies: Negative.   Psychiatric/Behavioral: Negative.      Current treatment- Brentuximab Vedotin/Adcetris, D1C2 on 12/21/17  No Known Allergies   Past Medical History:  Diagnosis Date  . Arthritis   . Cancer [facility])    lymphoma-right hip  . Deaf   . Hypertension      Past Surgical History:  Procedure Laterality Date  . CARDIAC SURGERY  1993  . CORONARY ARTERY BYPASS GRAFT    . CYST REMOVAL HAND    . PERIPHERAL VASCULAR CATHETERIZATION N/A 04/06/2016   Procedure: Glori Luis Cath Insertion;  Surgeon: Algernon Huxley, MD;  Location: [facility];  Service: Cardiovascular;  Laterality: N/A;    Social History   Socioeconomic History  . Marital status: Single    Spouse name: Not on file  . Number of children: Not on file  . Years of education: Not on file  . Highest education level: Not  on file  Occupational History  . Not on file  Social Needs  . Financial resource strain: Not on file  . Food insecurity:    Worry: Not on file    Inability: Not on file  . Transportation needs:    Medical: Not on file    Non-medical: Not on file  Tobacco Use  . Smoking status: Former Smoker    Last attempt to quit: 11/21/2015    Years since quitting: 2.1  . Smokeless tobacco: Never Used  Substance and Sexual Activity  . Alcohol use: Yes    Comment: rare  . Drug use: No  . Sexual activity: Never  Lifestyle  . Physical activity:    Days per week: Not on file    Minutes per session: Not on file  .  Stress: Not on file  Relationships  . Social connections:    Talks on phone: Not on file    Gets together: Not on file    Attends religious service: Not on file    Active member of club or organization: Not on file    Attends meetings of clubs or organizations: Not on file    Relationship status: Not on file  . Intimate partner violence:    Fear of current or ex partner: Not on file    Emotionally abused: Not on file    Physically abused: Not on file    Forced sexual activity: Not on file  Other Topics Concern  . Not on file  Social History Narrative  . Not on file    Family History  Problem Relation Age of Onset  . Lung cancer Mother   . Lung cancer Father   . Diabetes Maternal Uncle   . Myasthenia gravis Maternal Grandmother   . Leukemia Maternal Grandfather      Current Outpatient Medications:  .  acetaminophen (TYLENOL) 325 MG tablet, Take 650 mg by mouth., Disp: , Rfl:  .  acyclovir cream (ZOVIRAX) 5 %, Apply 1 application topically every 4 (four) hours., Disp: 5 g, Rfl: 0 .  allopurinol (ZYLOPRIM) 300 MG tablet, TAKE 1 TABLET BY MOUTH EVERY DAY, Disp: 30 tablet, Rfl: 3 .  aspirin 325 MG tablet, Take 325 mg by mouth daily., Disp: , Rfl:  .  atorvastatin (LIPITOR) 80 MG tablet, Take 80 mg by mouth., Disp: , Rfl:  .  B Complex Vitamins (B COMPLEX 1 PO), Take by mouth. , Disp: , Rfl:  .  celecoxib (CELEBREX) 200 MG capsule, , Disp: , Rfl:  .  Cholecalciferol (VITAMIN D3) 2000 units capsule, Take 2,000 Units by mouth daily., Disp: , Rfl:  .  FLUoxetine (PROZAC) 40 MG capsule, , Disp: , Rfl:  .  gabapentin (NEURONTIN) 100 MG capsule, Take 1 capsule (100 mg total) by mouth 2 (two) times daily., Disp: 60 capsule, Rfl: 3 .  HYDROcodone-acetaminophen (NORCO) 5-325 MG tablet, Take 1 tablet by mouth every 6 (six) hours as needed for moderate pain., Disp: 30 tablet, Rfl: 0 .  lidocaine-prilocaine (EMLA) cream, Apply cream 1 hour before chemotherapy treatment and place small peive  of saran wrap over cream to protect clothing, Disp: 30 g, Rfl: 1 .  naproxen sodium (ANAPROX) 220 MG tablet, Take 220 mg by mouth as needed., Disp: , Rfl:  .  ondansetron (ZOFRAN ODT) 4 MG disintegrating tablet, Take 1 tablet (4 mg total) by mouth every 8 (eight) hours as needed for nausea or vomiting., Disp: 20 tablet, Rfl: 0 .  pantoprazole (PROTONIX) 40 MG  tablet, Take 40 mg by mouth., Disp: , Rfl:  .  potassium chloride SA (K-DUR,KLOR-CON) 20 MEQ tablet, Take 1 tablet (20 mEq total) by mouth daily. Then as directed by oncology office., Disp: 10 tablet, Rfl: 0 .  triamterene-hydrochlorothiazide (DYAZIDE) 37.5-25 MG capsule, Take by mouth., Disp: , Rfl:  .  vitamin B-12 (CYANOCOBALAMIN) 500 MCG tablet, Take 500 mcg by mouth daily., Disp: , Rfl:  .  vitamin E 400 UNIT capsule, Take by mouth., Disp: , Rfl:  No current facility-administered medications for this visit.   Facility-Administered Medications Ordered in Other Visits:  .  heparin lock flush 100 unit/mL, 500 Units, Intravenous, Once, Verlon Au, NP .  sodium chloride flush (NS) 0.9 % injection 10 mL, 10 mL, Intravenous, PRN, Mike Gip, Melissa C, MD, 10 mL at 04/05/17 1034 .  sodium chloride flush (NS) 0.9 % injection 10 mL, 10 mL, Intravenous, PRN, Verlon Au, NP, 10 mL at 01/01/18 1550  Physical exam:  Vitals:   01/01/18 1529 01/01/18 1650  BP: 118/80 (!) 159/90  Pulse: (!) 102 98  Temp: 98.7 F (37.1 C)   TempSrc: Tympanic   SpO2: 95%      Physical Exam  Constitutional: She is oriented to person, place, and time. She appears well-developed and well-nourished.  Accompanied by daughter and significant other  HENT:  Head: Atraumatic.  Nose: Nose normal.  Mouth/Throat: Oropharynx is clear and moist. No oropharyngeal exudate.  Deaf- sign language.   Eyes: Conjunctivae are normal. No scleral icterus.  Neck: Normal range of motion.  Cardiovascular: Normal rate, regular rhythm and normal heart sounds.    Pulmonary/Chest: Effort normal and breath sounds normal.  Abdominal: Soft. Bowel sounds are normal.  Musculoskeletal: She exhibits no edema, tenderness or deformity.       Left hip: She exhibits normal strength, no tenderness, no bony tenderness, no swelling and no deformity.  Neurological: She is alert and oriented to person, place, and time.  Skin: Skin is warm and dry.  Psychiatric: She has a normal mood and affect.     CMP Latest Ref Rng & Units 01/01/2018  Glucose 65 - 99 mg/dL 140(H)  BUN 6 - 20 mg/dL 20  Creatinine 0.44 - 1.00 mg/dL 1.13(H)  Sodium 135 - 145 mmol/L 129(L)  Potassium 3.5 - 5.1 mmol/L 3.7  Chloride 101 - 111 mmol/L 96(L)  CO2 22 - 32 mmol/L 21(L)  Calcium 8.9 - 10.3 mg/dL 9.6  Total Protein 6.5 - 8.1 g/dL 7.5  Total Bilirubin 0.3 - 1.2 mg/dL 1.7(H)  Alkaline Phos 38 - 126 U/L 73  AST 15 - 41 U/L 49(H)  ALT 14 - 54 U/L 40   CBC Latest Ref Rng & Units 01/01/2018  WBC 3.6 - 11.0 K/uL 6.3  Hemoglobin 12.0 - 16.0 g/dL 11.8(L)  Hematocrit 35.0 - 47.0 % 34.5(L)  Platelets 150 - 440 K/uL 423    No images are attached to the encounter.  No results found.   Assessment and plan- Patient is a 77 y.o. female with stage IVB gray zone lymphoma who presents to [facility] for weakness.    1.Stage IVB B-Cell Lymphoma- unclassifiable- features intermediate b/t diffuse large b-cell lymphoma and classical hodgkin's lymphoma (gray zone lymphoma). Currently s/p cycle 2 brentuximab vedotin.   2. Orthostatic Hypotension- significant change in vital signs from sitting to standing. Gave 1L NaCl. Vitals improved to : sitting: 160/89 (HR 106) and standing: 124/85 (97). Patient feels better. Will have her return to clinic tomorrow for additional  liter of fluids. Encouraged to increase fluid intake at home.   3. Diarrhea- Recent history of norovirus which resolved prior to recent cycle of chemotherapy. Diarrhea 1-2 times per day since recent chemo. Will re-check c diff and gi panel but low  suspicion. Likely r/t treatment. If negative would consider imodium or anti-diarrheals.   4. Osteoarthritis of left hip- fall/weakness likely due to OA of left hip. Low suspicion of fracture today. Patient is weight bearing, crossing and uncrossing legs, pulling legs to chest during exam. She was started on hydrocodone by [name] but has not been taking. Discussed refilling Tramadol but patient feels pain didn't improve with medication. Per patient, she prefers tylenol. Will defer further management to PCP and primary team.   rtc on 01/02/18 for IV fluids then again, as scheduled on 01/11/18 for labs and consideration of brentuximab vedotin by [name].   Patient advised to notify the clinic if there is no improvement in symptoms or if symptoms worsen in next 3-4 days.   Visit Diagnosis 1. B-cell lymphoma of lymph nodes of multiple regions, unspecified B-cell lymphoma type ([place])   2. Orthostatic hypotension   3. Diarrhea, unspecified type     Patient expressed understanding and was in agreement with this plan. She also understands that She can call clinic at any time with any questions, concerns, or complaints.   A total of (45) minutes of face-to-face time was spent with this patient with greater than 50% of that time in counseling and care-coordination.   Patient is deaf and uses sign language to communicate. Per patient and family request, daughter served as interpreter and was present during the history-taking and subsequent discussion (and for part of the physical exam) with this patient.  Beckey Rutter, DNP, AGNP-C [place] at [facility] [phone] (work cell) [phone] (office) 01/01/18 5:35 PM

## 2018-01-01 NOTE — Telephone Encounter (Signed)
Call from daughter asking for patient to be seen due to weakness and falling. Appointment given for 245 lab and 3 PM NP and accepted

## 2018-01-02 ENCOUNTER — Inpatient Hospital Stay: Payer: Medicare HMO

## 2018-01-02 ENCOUNTER — Other Ambulatory Visit: Payer: Self-pay

## 2018-01-02 VITALS — BP 125/77 | HR 100 | Temp 98.7°F | Resp 20

## 2018-01-02 DIAGNOSIS — R197 Diarrhea, unspecified: Secondary | ICD-10-CM

## 2018-01-02 DIAGNOSIS — E860 Dehydration: Secondary | ICD-10-CM

## 2018-01-02 DIAGNOSIS — C8518 Unspecified B-cell lymphoma, lymph nodes of multiple sites: Secondary | ICD-10-CM | POA: Diagnosis not present

## 2018-01-02 LAB — GASTROINTESTINAL PANEL BY PCR, STOOL (REPLACES STOOL CULTURE)
ADENOVIRUS F40/41: NOT DETECTED
Astrovirus: NOT DETECTED
CAMPYLOBACTER SPECIES: NOT DETECTED
CRYPTOSPORIDIUM: NOT DETECTED
Cyclospora cayetanensis: NOT DETECTED
ENTEROPATHOGENIC E COLI (EPEC): NOT DETECTED
Entamoeba histolytica: NOT DETECTED
Enteroaggregative E coli (EAEC): NOT DETECTED
Enterotoxigenic E coli (ETEC): NOT DETECTED
Giardia lamblia: NOT DETECTED
NOROVIRUS GI/GII: NOT DETECTED
PLESIMONAS SHIGELLOIDES: NOT DETECTED
ROTAVIRUS A: NOT DETECTED
SHIGELLA/ENTEROINVASIVE E COLI (EIEC): NOT DETECTED
Salmonella species: NOT DETECTED
Sapovirus (I, II, IV, and V): NOT DETECTED
Shiga like toxin producing E coli (STEC): NOT DETECTED
Vibrio cholerae: NOT DETECTED
Vibrio species: NOT DETECTED
YERSINIA ENTEROCOLITICA: NOT DETECTED

## 2018-01-02 LAB — C DIFFICILE QUICK SCREEN W PCR REFLEX
C Diff antigen: NEGATIVE
C Diff interpretation: NOT DETECTED
C Diff toxin: NEGATIVE

## 2018-01-02 MED ORDER — SODIUM CHLORIDE 0.9 % IV SOLN
Freq: Once | INTRAVENOUS | Status: AC
Start: 2018-01-02 — End: 2018-01-02
  Administered 2018-01-02: 14:00:00 via INTRAVENOUS
  Filled 2018-01-02: qty 1000

## 2018-01-02 MED ORDER — HEPARIN SOD (PORK) LOCK FLUSH 100 UNIT/ML IV SOLN
500.0000 [IU] | Freq: Once | INTRAVENOUS | Status: AC
  Administered 2018-01-02: 500 [IU] via INTRAVENOUS

## 2018-01-03 ENCOUNTER — Telehealth: Payer: Self-pay | Admitting: *Deleted

## 2018-01-03 NOTE — Telephone Encounter (Signed)
Toni Griffith called asking for results of lab done yesterday. Results negative, Toni Griffith informed

## 2018-01-07 ENCOUNTER — Ambulatory Visit
Admission: EM | Admit: 2018-01-07 | Discharge: 2018-01-07 | Disposition: A | Discharge status: Home / Self Care | Payer: Medicare HMO | Attending: Family Medicine | Admitting: Family Medicine

## 2018-01-07 ENCOUNTER — Ambulatory Visit (INDEPENDENT_AMBULATORY_CARE_PROVIDER_SITE_OTHER): Payer: Medicare HMO

## 2018-01-07 DIAGNOSIS — M19032 Primary osteoarthritis, left wrist: Secondary | ICD-10-CM | POA: Diagnosis not present

## 2018-01-07 DIAGNOSIS — S63502A Unspecified sprain of left wrist, initial encounter: Secondary | ICD-10-CM

## 2018-01-07 NOTE — ED Triage Notes (Signed)
Pt was getting up off the couch and hurts when she applied pressure to her wrist and was bent over in pain. No injury reported, hurts when she tries to move it. Never had a hx of this before. This happened about 30 mins ago. No otc meds tried.

## 2018-01-07 NOTE — ED Provider Notes (Signed)
[facility]    CSN: [id number] Arrival date & time: 01/07/18  1452     History   Chief Complaint Chief Complaint  Patient presents with  . Wrist Pain    HPI Toni Griffith is a 77 y.o. female.   77 yo female accompanied by daughter with a c/o acute left wrist pain since this morning when patient was getting up off the couch. Denies any falls, redness, rash , fevers, chills. Patient has a h/o lymphoma and current undergoing treatment for this.   The history is provided by the patient and a relative.  Wrist Pain     Past Medical History:  Diagnosis Date  . Arthritis   . Cancer [facility])    lymphoma-right hip  . Deaf   . Hypertension     Patient Active Problem List   Diagnosis Date Noted  . Chronic pain of left knee 12/23/2017  . Neuropathy 12/06/2017  . 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 Pain of both hip joints 11/24/2017  . Encounter for monoclonal antibody treatment for malignancy 11/23/2017  . Goals of care, counseling/discussion 11/01/2017  . 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[place]) 11/01/2017  . Squamous cell lung cancer ([place]) 09/05/2016  . Hypokalemia 04/29/2016  . Hypercalcemia 03/30/2016  . Anemia 03/30/2016  . Non-Hodgkin's lymphoma ([place]) 03/08/2016  . Hodgkin's lymphoma ([place]) 03/08/2016  . Bone metastasis ([place]) 03/03/2016    Past Surgical History:  Procedure Laterality Date  . CARDIAC SURGERY  1993  . CORONARY ARTERY BYPASS GRAFT    . CYST REMOVAL HAND    . PERIPHERAL VASCULAR CATHETERIZATION N/A 04/06/2016   Procedure: Glori Luis Cath Insertion;  Surgeon: Algernon Huxley, MD;  Location: [facility];  Service: Cardiovascular;  Laterality: N/A;    OB History   None      Home Medications    Prior to Admission medications   Medication Sig Start Date End Date Taking? Authorizing Provider  acetaminophen (TYLENOL) 325 MG tablet Take 650 mg by mouth.   Yes [provider]  allopurinol  (ZYLOPRIM) 300 MG tablet TAKE 1 TABLET BY MOUTH EVERY DAY 11/14/17  Yes Karen Kitchens, NP  aspirin 325 MG tablet Take 325 mg by mouth daily.   Yes [provider]  atorvastatin (LIPITOR) 80 MG tablet Take 80 mg by mouth. 10/14/15 01/07/18 Yes [provider]  B Complex Vitamins (B COMPLEX 1 PO) Take by mouth.    Yes [provider]  celecoxib (CELEBREX) 200 MG capsule  04/27/17  Yes [provider]  Cholecalciferol (VITAMIN D3) 2000 units capsule Take 2,000 Units by mouth daily.   Yes [provider]  FLUoxetine (PROZAC) 40 MG capsule  04/20/17  Yes [provider]  gabapentin (NEURONTIN) 100 MG capsule Take 1 capsule (100 mg total) by mouth 2 (two) times daily. 11/14/17  Yes Karen Kitchens, NP  HYDROcodone-acetaminophen (NORCO) 5-325 MG tablet Take 1 tablet by mouth every 6 (six) hours as needed for moderate pain. 12/21/17  Yes Karen Kitchens, NP  lidocaine-prilocaine (EMLA) cream Apply cream 1 hour before chemotherapy treatment and place small peive of saran wrap over cream to protect clothing 04/06/16  Yes Corcoran, Drue Second, MD  naproxen sodium (ANAPROX) 220 MG tablet Take 220 mg by mouth as needed.   Yes [provider]  ondansetron (ZOFRAN ODT) 4 MG disintegrating tablet Take 1 tablet (4 mg total) by mouth every 8 (eight) hours as needed for nausea or vomiting.  11/01/17  Yes Karen Kitchens, NP  pantoprazole (PROTONIX) 40 MG tablet Take 40 mg by mouth. 02/04/16  Yes [provider]  potassium chloride SA (K-DUR,KLOR-CON) 20 MEQ tablet Take 1 tablet (20 mEq total) by mouth daily. Then as directed by oncology office. 12/06/17  Yes Karen Kitchens, NP  triamterene-hydrochlorothiazide (DYAZIDE) 37.5-25 MG capsule Take by mouth. 10/14/15 01/07/18 Yes [provider]  vitamin B-12 (CYANOCOBALAMIN) 500 MCG tablet Take 500 mcg by mouth daily.   Yes [provider]  vitamin E 400 UNIT capsule Take by mouth.   Yes [provider]  acyclovir cream (ZOVIRAX) 5 % Apply 1 application topically every 4 (four) hours. 12/21/17   Karen Kitchens, NP    Family History Family History  Problem Relation Age of Onset  . Lung cancer Mother   . Lung cancer Father   . Diabetes Maternal Uncle   . Myasthenia gravis Maternal Grandmother   . Leukemia Maternal Grandfather     Social History Social History   Tobacco Use  . Smoking status: Former Smoker    Last attempt to quit: 11/21/2015    Years since quitting: 2.1  . Smokeless tobacco: Never Used  Substance Use Topics  . Alcohol use: Yes    Comment: rare  . Drug use: No     Allergies   Patient has no known allergies.   Review of Systems Review of Systems   Physical Exam Triage Vital Signs ED Triage Vitals  Enc Vitals Group     BP 01/07/18 1509 122/90     Pulse Rate 01/07/18 1509 93     Resp 01/07/18 1509 18     Temp 01/07/18 1509 98.7 F (37.1 C)     Temp Source 01/07/18 1509 Oral     SpO2 01/07/18 1509 94 %     Weight --      Height --      Head Circumference --      Peak Flow --      Pain Score 01/07/18 1511 8     Pain Loc --      Pain Edu? --      Excl. in [place]? --    No data found.  Updated Vital Signs BP 122/90 (BP Location: Right Arm)   Pulse 93   Temp 98.7 F (37.1 C) (Oral)   Resp 18   SpO2 94%   Visual Acuity Right Eye Distance:   Left Eye Distance:   Bilateral Distance:    Right Eye Near:   Left Eye Near:    Bilateral Near:     Physical Exam  Constitutional: She appears well-developed and well-nourished. No distress.  Musculoskeletal:       Left wrist: She exhibits tenderness, bony tenderness and swelling. She exhibits normal range of motion, no effusion, no crepitus, no deformity and no laceration.  Skin: She is not diaphoretic.  Nursing note and vitals reviewed.    UC Treatments / Results  Labs (all labs ordered are listed, but only abnormal results are displayed) Labs Reviewed - No data to  display  EKG None  Radiology Dg Wrist Complete Left  Result Date: 01/07/2018 CLINICAL DATA:  77 year old female with a history wrist pain EXAM: LEFT WRIST - COMPLETE 3+ VIEW COMPARISON:  None. FINDINGS: Osteopenia. No acute displaced fracture. Carpal bones maintain relative alignment. Cystic changes at the proximal scaphoid, without acute fracture identified. Degenerative changes at the first carpometacarpal joint IMPRESSION: Degenerative changes at the wrist, with  no acute bony abnormality identified. Electronically Signed   By: Corrie Mckusick D.O.   On: 01/07/2018 15:55    Procedures Procedures (including critical care time)  Medications Ordered in UC Medications - No data to display  Initial Impression / Assessment and Plan / UC Course  I have reviewed the triage vital signs and the nursing notes.  Pertinent labs & imaging results that were available during my care of the patient were reviewed by me and considered in my medical decision making (see chart for details).      Final Clinical Impressions(s) / UC Diagnoses   Final diagnoses:  Sprain of left wrist, initial encounter  Osteoarthritis of left wrist, unspecified osteoarthritis type   Discharge Instructions   None    ED Prescriptions    None     1. X-ray results and diagnosis reviewed with patient and daughter  2.wrist splint for support 3. Recommend supportive treatment with rest, ice, NSAIDS prn 4. Follow-up prn if symptoms worsen or don't improve   Controlled Substance Prescriptions [place] Controlled Substance Registry consulted? Not Applicable   Norval Gable, MD 01/07/18 1721

## 2018-01-10 ENCOUNTER — Telehealth: Payer: Self-pay | Admitting: *Deleted

## 2018-01-10 NOTE — Telephone Encounter (Signed)
Son called and states he needs a consent for patient to have a massage. Please call when note ready

## 2018-01-10 NOTE — Telephone Encounter (Signed)
We are seeing her tomorrow. We can have it ready for them at that time, or if needed earlier, I can do it at lunch. No problem at all.

## 2018-01-10 NOTE — Telephone Encounter (Signed)
Message left that she can get note tomorrow

## 2018-01-10 NOTE — Telephone Encounter (Signed)
Son informed that patient can get note tomorrow when she comes in for appointment. Had to leave message on his voice mail

## 2018-01-11 ENCOUNTER — Encounter: Payer: Self-pay | Admitting: Hematology and Oncology

## 2018-01-11 ENCOUNTER — Other Ambulatory Visit: Payer: Self-pay | Admitting: Hematology and Oncology

## 2018-01-11 ENCOUNTER — Inpatient Hospital Stay: Payer: Medicare HMO | Admitting: Hematology and Oncology

## 2018-01-11 ENCOUNTER — Inpatient Hospital Stay: Payer: Medicare HMO

## 2018-01-11 ENCOUNTER — Encounter: Payer: Self-pay | Admitting: Urgent Care

## 2018-01-11 VITALS — BP 126/82 | HR 96 | Temp 97.4°F | Resp 16 | Wt 176.8 lb

## 2018-01-11 VITALS — BP 137/81 | HR 94 | Temp 95.7°F | Resp 17

## 2018-01-11 DIAGNOSIS — E876 Hypokalemia: Secondary | ICD-10-CM | POA: Diagnosis not present

## 2018-01-11 DIAGNOSIS — R634 Abnormal weight loss: Secondary | ICD-10-CM

## 2018-01-11 DIAGNOSIS — F329 Major depressive disorder, single episode, unspecified: Secondary | ICD-10-CM | POA: Diagnosis not present

## 2018-01-11 DIAGNOSIS — R531 Weakness: Secondary | ICD-10-CM | POA: Diagnosis not present

## 2018-01-11 DIAGNOSIS — R21 Rash and other nonspecific skin eruption: Secondary | ICD-10-CM

## 2018-01-11 DIAGNOSIS — C8518 Unspecified B-cell lymphoma, lymph nodes of multiple sites: Secondary | ICD-10-CM | POA: Diagnosis not present

## 2018-01-11 DIAGNOSIS — Z5112 Encounter for antineoplastic immunotherapy: Secondary | ICD-10-CM

## 2018-01-11 DIAGNOSIS — Z7189 Other specified counseling: Secondary | ICD-10-CM

## 2018-01-11 LAB — CBC WITH DIFFERENTIAL/PLATELET
Basophils Absolute: 0.2 10*3/uL — ABNORMAL HIGH (ref 0–0.1)
Basophils Relative: 3 %
Eosinophils Absolute: 0 10*3/uL (ref 0–0.7)
Eosinophils Relative: 0 %
HCT: 32.4 % — ABNORMAL LOW (ref 35.0–47.0)
Hemoglobin: 11 g/dL — ABNORMAL LOW (ref 12.0–16.0)
Lymphocytes Relative: 27 %
Lymphs Abs: 1.3 10*3/uL (ref 1.0–3.6)
MCH: 29.6 pg (ref 26.0–34.0)
MCHC: 33.8 g/dL (ref 32.0–36.0)
MCV: 87.6 fL (ref 80.0–100.0)
Monocytes Absolute: 1 10*3/uL — ABNORMAL HIGH (ref 0.2–0.9)
Monocytes Relative: 20 %
Neutro Abs: 2.4 10*3/uL (ref 1.4–6.5)
Neutrophils Relative %: 50 %
Platelets: 372 10*3/uL (ref 150–440)
RBC: 3.7 MIL/uL — ABNORMAL LOW (ref 3.80–5.20)
RDW: 20.2 % — ABNORMAL HIGH (ref 11.5–14.5)
WBC: 4.9 10*3/uL (ref 3.6–11.0)

## 2018-01-11 LAB — MAGNESIUM: Magnesium: 1.7 mg/dL (ref 1.7–2.4)

## 2018-01-11 LAB — COMPREHENSIVE METABOLIC PANEL
ALT: 20 U/L (ref 14–54)
AST: 30 U/L (ref 15–41)
Albumin: 3.5 g/dL (ref 3.5–5.0)
Alkaline Phosphatase: 80 U/L (ref 38–126)
Anion gap: 11 (ref 5–15)
BUN: 19 mg/dL (ref 6–20)
CO2: 22 mmol/L (ref 22–32)
Calcium: 8.9 mg/dL (ref 8.9–10.3)
Chloride: 97 mmol/L — ABNORMAL LOW (ref 101–111)
Creatinine, Ser: 1.08 mg/dL — ABNORMAL HIGH (ref 0.44–1.00)
GFR calc Af Amer: 57 mL/min — ABNORMAL LOW (ref >60)
GFR calc non Af Amer: 49 mL/min — ABNORMAL LOW (ref >60)
Glucose, Bld: 134 mg/dL — ABNORMAL HIGH (ref 65–99)
Potassium: 3.1 mmol/L — ABNORMAL LOW (ref 3.5–5.1)
Sodium: 130 mmol/L — ABNORMAL LOW (ref 135–145)
Total Bilirubin: 1.1 mg/dL (ref 0.3–1.2)
Total Protein: 6.7 g/dL (ref 6.5–8.1)

## 2018-01-11 LAB — URIC ACID: Uric Acid, Serum: 4 mg/dL (ref 2.3–6.6)

## 2018-01-11 LAB — LACTATE DEHYDROGENASE: LDH: 126 U/L (ref 98–192)

## 2018-01-11 MED ORDER — POTASSIUM CHLORIDE CRYS ER 20 MEQ PO TBCR: 20.0000 meq | EXTENDED_RELEASE_TABLET | Freq: Every day | ORAL | 0 refills | Status: DC

## 2018-01-11 MED ORDER — DEXAMETHASONE SODIUM PHOSPHATE 10 MG/ML IJ SOLN
10.0000 mg | Freq: Once | INTRAMUSCULAR | Status: AC
  Administered 2018-01-11: 10 mg via INTRAVENOUS
  Filled 2018-01-11: qty 1

## 2018-01-11 MED ORDER — ACETAMINOPHEN 325 MG PO TABS
650.0000 mg | ORAL_TABLET | Freq: Once | ORAL | Status: AC
Start: 2018-01-11 — End: 2018-01-11
  Administered 2018-01-11: 650 mg via ORAL
  Filled 2018-01-11: qty 2

## 2018-01-11 MED ORDER — HEPARIN SOD (PORK) LOCK FLUSH 100 UNIT/ML IV SOLN
500.0000 [IU] | Freq: Once | INTRAVENOUS | Status: AC
  Administered 2018-01-11: 500 [IU] via INTRAVENOUS
  Filled 2018-01-11: qty 5

## 2018-01-11 MED ORDER — SODIUM CHLORIDE 0.9 % IV SOLN
150.0000 mg | Freq: Once | INTRAVENOUS | Status: AC
  Administered 2018-01-11: 150 mg via INTRAVENOUS
  Filled 2018-01-11: qty 30

## 2018-01-11 MED ORDER — HEPARIN SOD (PORK) LOCK FLUSH 100 UNIT/ML IV SOLN: 500.0000 [IU] | Freq: Once | INTRAVENOUS | Status: DC | PRN

## 2018-01-11 MED ORDER — DIPHENHYDRAMINE HCL 50 MG/ML IJ SOLN
25.0000 mg | Freq: Once | INTRAMUSCULAR | Status: AC
Start: 2018-01-11 — End: 2018-01-11
  Administered 2018-01-11: 25 mg via INTRAVENOUS
  Filled 2018-01-11: qty 1

## 2018-01-11 MED ORDER — SODIUM CHLORIDE 0.9 % IV SOLN: 10.0000 mg | Freq: Once | INTRAVENOUS | Status: DC

## 2018-01-11 MED ORDER — SODIUM CHLORIDE 0.9% FLUSH
10.0000 mL | Freq: Once | INTRAVENOUS | Status: AC
  Administered 2018-01-11: 10 mL via INTRAVENOUS
  Filled 2018-01-11: qty 10

## 2018-01-11 MED ORDER — SODIUM CHLORIDE 0.9 % IV SOLN
Freq: Once | INTRAVENOUS | Status: AC
  Administered 2018-01-11: 14:00:00 via INTRAVENOUS
  Filled 2018-01-11: qty 1000

## 2018-01-11 MED ORDER — SODIUM CHLORIDE 0.9% FLUSH
10.0000 mL | INTRAVENOUS | Status: DC | PRN
  Filled 2018-01-11: qty 10

## 2018-01-11 NOTE — Progress Notes (Signed)
Okay to proceed with treatment with 01/11/18 labs, NNO per Honor Loh NP. Pt aware of home instructions and verbalizes understanding.

## 2018-01-11 NOTE — Patient Instructions (Addendum)
Potassium is still low at 3.1. You are taking 10 mEq twice a day. I would like for you to take an additional 20 mEq tablet daily for 5 days, then resume your normal dose.   I have sent in a new prescription to your pharmacy in [place]. I am going to send in another 10 pills. This way, you can have a supply HERE for when you are at [name] house. I would encourage you to speak with Dr. Enid Derry about considering a change in your diuretic therapy, which makes you lose potassium via your urine. There are other options that may work similarly without causing your potassium to be low.   Respectfully, Honor Loh, MSN, APRN, FNP-C, CEN Oncology/Hematology Nurse Practitioner  [facility]

## 2018-01-11 NOTE — Progress Notes (Signed)
Patient is here today for a follow up.

## 2018-01-11 NOTE — Progress Notes (Signed)
[facility] day:  01/11/2018   Chief Complaint: Toni Griffith is a 77 y.o. female with stage IVB gray zone lymphoma who is seen for assessment prior to cycle #3 brentuximab vedotin (Adcetris).  HPI:  The patient was last seen in the medical oncology clinic on 12/21/2017.  At that time, she was doing well.  Norovirus symptoms had resolved.  She had pain in her hips and left knee. 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  She denied fevers.  Exam was stable.  She received cycle #2 Adcetris.  She was seen by Beckey Rutter, NP on 01/01/2018 for weakness. She was orthostatic and received IVF.  She was seen in the [facility] ER on 01/07/2018 with wrist pain.  Plain films revealed degenerative changes.  She was diagnosed with a sprain and osteoarthritis.  Recommendations were for rest, ice, and NSAIDs prn.  During the interim, patient "has not been doing well". She has had weakness and fatigue. Patient has not been eating and drinking normally citing the fact that she "just doesn't feel like eating". Weight is down 6 pounds. Patient has pain medications that "helps some". She is not taking it everyday as prescribed. She mostly has been using Tylenol arthritis. She denies pain in the clinic today.   Patient has a non-puritic rash to her to the LEFT back and buttocks that declared on Wednesday (01/09/2018).  Daughter is concerned that the patient is more depressed. She notes that the patient has been saying that she is "just tired of hurting all of the time". Patient endorses "physical pain" at night in her LEFT hip, thigh, and knee.   Patient denies that she has experienced any B symptoms. She denies any interval infections.    Past Medical History:  Diagnosis Date  . Arthritis   . Cancer [facility])    lymphoma-right hip  . Deaf   . Hypertension     Past Surgical History:  Procedure Laterality Date  . CARDIAC SURGERY  1993  . CORONARY ARTERY  BYPASS GRAFT    . CYST REMOVAL HAND    . PERIPHERAL VASCULAR CATHETERIZATION N/A 04/06/2016   Procedure: Glori Luis Cath Insertion;  Surgeon: Algernon Huxley, MD;  Location: [facility];  Service: Cardiovascular;  Laterality: N/A;    Family History  Problem Relation Age of Onset  . Lung cancer Mother   . Lung cancer Father   . Diabetes Maternal Uncle   . Myasthenia gravis Maternal Grandmother   . Leukemia Maternal Grandfather     Social History:  reports that she quit smoking about 2 years ago. She has never used smokeless tobacco. She reports that she drinks alcohol. She reports that she does not use drugs.  She smoked 1 1/2 packs per day for 20 years (30 pack year smoking history) until 10/2015.  She is hearing impaired (deaf).  She lives in [place] with her boyfriend.  Her daughter lives in [place].  Her daughter, Toni Griffith's contact number is [phone].  The patient is accompanied her daughter and Toni Griffith, the interpreter.   Allergies: No Known Allergies  Current Medications: Current Outpatient Medications  Medication Sig Dispense Refill  . acetaminophen (TYLENOL) 325 MG tablet Take 650 mg by mouth.    Marland Kitchen acyclovir cream (ZOVIRAX) 5 % Apply 1 application topically every 4 (four) hours. 5 g 0  . allopurinol (ZYLOPRIM) 300 MG tablet TAKE 1 TABLET BY MOUTH EVERY DAY 30 tablet  3  . aspirin 325 MG tablet Take 325 mg by mouth daily.    Marland Kitchen atorvastatin (LIPITOR) 80 MG tablet Take 80 mg by mouth.    . B Complex Vitamins (B COMPLEX 1 PO) Take by mouth.     . Cholecalciferol (VITAMIN D3) 2000 units capsule Take 2,000 Units by mouth daily.    Marland Kitchen FLUoxetine (PROZAC) 40 MG capsule     . HYDROcodone-acetaminophen (NORCO) 5-325 MG tablet Take 1 tablet by mouth every 6 (six) hours as needed for moderate pain. 30 tablet 0  . lidocaine-prilocaine (EMLA) cream Apply cream 1 hour before chemotherapy treatment and place small peive of saran wrap over cream to protect clothing 30 g 1  . naproxen sodium  (ANAPROX) 220 MG tablet Take 220 mg by mouth as needed.    . ondansetron (ZOFRAN ODT) 4 MG disintegrating tablet Take 1 tablet (4 mg total) by mouth every 8 (eight) hours as needed for nausea or vomiting. 20 tablet 0  . pantoprazole (PROTONIX) 40 MG tablet Take 40 mg by mouth.    . potassium chloride SA (K-DUR,KLOR-CON) 20 MEQ tablet Take 1 tablet (20 mEq total) by mouth daily. Then as directed by oncology office. 10 tablet 0  . vitamin B-12 (CYANOCOBALAMIN) 500 MCG tablet Take 500 mcg by mouth daily.    . vitamin E 400 UNIT capsule Take by mouth.    . celecoxib (CELEBREX) 200 MG capsule     . gabapentin (NEURONTIN) 100 MG capsule Take 1 capsule (100 mg total) by mouth 2 (two) times daily. (Patient not taking: Reported on 01/11/2018) 60 capsule 3  . triamterene-hydrochlorothiazide (DYAZIDE) 37.5-25 MG capsule Take by mouth.     No current facility-administered medications for this visit.    Facility-Administered Medications Ordered in Other Visits  Medication Dose Route Frequency Provider Last Rate Last Dose  . heparin lock flush 100 unit/mL  500 Units Intravenous Once Corcoran, Melissa C, MD      . sodium chloride flush (NS) 0.9 % injection 10 mL  10 mL Intravenous PRN Nolon Stalls C, MD   10 mL at 04/05/17 1034    Review of Systems  Constitutional: Positive for malaise/fatigue and weight loss (down 6 pounds). Negative for diaphoresis and fever.       Weakness and fatigue.  HENT: Positive for hearing loss (Deaf). Negative for congestion, nosebleeds and sore throat.   Eyes: Negative.  Negative for blurred vision, double vision, pain, discharge and redness.  Respiratory: Negative.  Negative for cough, hemoptysis, sputum production and shortness of breath.   Cardiovascular: Negative.  Negative for chest pain, palpitations, orthopnea, leg swelling and PND.  Gastrointestinal: Negative.  Negative for abdominal pain, blood in stool, constipation, diarrhea (resolved), melena, nausea and  vomiting.  Genitourinary: Negative.  Negative for dysuria, frequency, hematuria and urgency.  Musculoskeletal: Positive for joint pain (hips; left knee). Negative for back pain, falls and myalgias.  Skin: Positive for rash. Negative for itching.       Chemotherapy induced very mild hair loss  Neurological: Negative for dizziness, tremors, weakness and headaches.       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  Does not bruise/bleed easily.  Psychiatric/Behavioral: Positive for depression. Negative for memory loss and suicidal ideas. The patient is not nervous/anxious and does not have insomnia.   All other systems reviewed and are negative.  Performance status (ECOG): 2 - Symptomatic, <50% confined to bed  Physical Exam:  Blood pressure 126/82, pulse  96, temperature (!) 97.4 F (36.3 C), temperature source Tympanic, resp. rate 16, weight 176 lb 12.8 oz (80.2 kg). GENERAL:  Elderly woman sitting comfortably in a wheelchair in the exam room in no acute distress. MENTAL STATUS:  Alert and oriented to person, place and time. HEAD:  Curly gray hair.  Normocephalic, atraumatic, face symmetric, no Cushingoid features. EYES:  Hazel eyes.  Pupils equal round and reactive to light and accomodation.  No conjunctivitis or scleral icterus. ENT:  Oropharynx clear without lesion.  Tongue normal. Mucous membranes moist.  RESPIRATORY:  Clear to auscultation without rales, wheezes or rhonchi. CARDIOVASCULAR:  Regular rate and rhythm without murmur, rub or gallop. ABDOMEN:  Ventral hernia.  Soft, non-tender, with active bowel sounds, and no hepatosplenomegaly.  No masses. SKIN:  Right upper back and right lower back subtle erythematous maculopapular rash.  No vesicles. EXTREMITIES: No edema, no skin discoloration or tenderness.  No palpable cords. LYMPH NODES: No palpable cervical, supraclavicular, axillary or inguinal adenopathy  NEUROLOGICAL: Unremarkable. PSYCH:   Appropriate.    Radiology studies: 02/20/2016:  Chest, abdomen and pelvic CT revealed a 3.7 cm subpleural mass within the left lower lobe, a 1.1 cm right lower lobe subpleural nodule , 9 mm RUL nodule, and 0.4 cm lingular nodule.  Between the left second, third, and fourth ribs there was 1.9 cm soft tissue mass. There was a 2.5 cm low-attenuation nodule in the left lobe of the thyroid.   There were prominent left axillary nodes (up to 1.0 cm).   There was no definite mediastinal adenopathy. There was a 5.5 x 4.6 cm cystic lesion in the left hepatic lobe.  There was a 3.1 cm nodule in the right adrenal gland. There was a 1.2 cm right retrocaval lymph node with additional prominent subcentimeter retroperitoneal nodes. There was 1.0 cm left common iliac node. There was a 6.2 x 5.2 cm soft tissue mass within the right ilium.  02/21/2016:  Head MRI revealed no evidence of malignancy. Thyroid ultrasound on 02/21/2016 revealed a 2.4 cm solid nodule in the inferior aspect of the left lobe 03/03/2016:  PET scan revealed a hypermetabolic left lower lobe mass, bilateral pleural and pulmonary parenchymal nodular metastasis, a large pleural lesion invading the anterior left chest wall.   There was activity in the superior left ocular orbit.  There was a large lesion centered in the right iliac wing with soft tissue extension.  There were hypermetabolic right external iliac lymph nodes 06/06/2016:  PET scan revealed significant interval response to therapy.  There was significant decrease in size and FDG uptake associated with left lower lobe lung lesion.  There was resolution of left-sided pleural base metastasis and left axillary and anterior mediastinal nodal metastasis.  There was resolution of previous hypermetabolic soft tissue metastasis/nodal metastasis posterior to the IVC.  There was marked improvement in hypermetabolic metastasis involving the right iliac wing.  There was persistent right upper lobe lung nodule  which exhibits intense radiotracer uptake which may represent a focus of metastatic disease or synchronous primary lung neoplasm. 06/01/2017:  PET scan revealed no new or progressive findings.  The 9 mm RUL pulmonary nodule remained hypermetabolic (SUV 97.0, previously 7.3) and no change in size.  The index lesion in the posterior left lower lobe was minimally smaller on CT imaging with very low level FDG uptake (SUV 1.6).  There was no change in the appearance at site of previous disease at the right iliac wing (SUV 1.9 compared to 2.6 previously). 01/29/2017:  PET scan revealed interval progression of hypermetabolic nodules in the right upper (13 mm compared to 9 mm; SUV 10) and left lower lobes (26 mm compared to 22 mm; SUV 7.5).  There was new hypermetabolic focus of activity along the anterior left pleura although no underlying pleural or lung mass was evident. 09/18/2017:  PET scan revealed a mixed appearance, with marked improvement in the prior 2 pulmonary nodules; but with new Deauville 5 left common iliac lymph node enlargement; new Deauville 5 right paraspinal lesion; and new Deauville 4 left infrahilar nodal activity.  There was a focus of accentuated metabolic activity which had been present along the right lower submandibular gland but currently seemed to project over the subcutaneous tissues without CT correlate. Current maximum SUV 10.5, previously 9.7.  There was increased accentuated activity along the left lateral spurring at L2-3. While typically such activity is inflammatory due to spurring, this had increased significantly since the prior exam and merit surveillance. There was a new 8 by 5 mm right upper lobe pulmonary nodule, Deauville 2, which also merits surveillance.   Assessment:  Bretta Fees is a 77 y.o. female with stage IVB B-cell lymphoma, unclassifiable, with features intermediate between diffuse large B-cell lymphoma and classical Hodgkin's lymphoma ("gray zone" lymphoma).   She underwent right iliac wing bone/bone marrow biopsy on 03/08/2016.  Head MRI on 02/21/2016 revealed no evidence of malignancy. 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   There was activity in the superior left ocular orbit.  There was a large lesion centered in the right iliac wing with soft tissue extension.  There were hypermetabolic right external iliac lymph nodes  She was admitted to [facility] in [facility].  She had renal failure and hypercalcemia. She was treated with IVF, calcitonin and Zometa.The patient has a history of coronary artery disease s/p CABG in 1993.  She has no history of heart failure.  She is deaf and requires an interpreter.  Echo on 04/04/2016 revealed an EF of 55-60%.  Hepatitis B and C testing was negative on 03/30/2016.   G6PD assay was normal.  She declined LP with MTX prophylaxis.  She received 6 cycles of mini-RCHOP (04/07/2016 - 07/21/2016).  She has had some transient fingertip numbness.  She received radiation to the right iliac wing,  from 10/11/2016 - 11/01/2016.  She received 3000 cGy over 3 weeks.  PET scan on 01/29/2017 revealed interval progression of hypermetabolic nodules in the right upper (13 mm compared to 9 mm; SUV 10) and left lower lobes (26 mm compared to 22 mm; SUV 7.5).  There was new hypermetabolic focus of activity along the anterior left pleura although no underlying pleural or lung mass was evident.  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  She underwent SBRT 03/26/2017 - 04/09/2017.  She received 5000 cGy in 5 fractions to the left lower lobe lesion.  She underwent SBRT to a RUL lesion from 05/23/2017 - 06/18/2017.  PET scan on  09/18/2017 revealed a mixed appearance, with marked improvement in the prior 2 pulmonary nodules; but with new Deauville 5 left common iliac lymph node enlargement; new Deauville 5 right paraspinal lesion; and new Deauville 4 left infrahilar nodal activity.  There was a  focus of accentuated metabolic activity which had been present along the right lower submandibular gland but currently seemed to project over the subcutaneous tissues without CT correlate. Current maximum SUV 10.5, previously 9.7. There was increased accentuated activity along the left lateral spurring at L2-3. While typically such activity is inflammatory due to spurring, this had increased significantly since the prior exam and merit surveillance. There was a new 8 by 5 mm right upper lobe pulmonary nodule, Deauville 2.  CT guided right paraspinal lesion biopsy on 10/16/2017 was compatible with involvement by the patient's previously diagnosed lymphoma.  The case was referred to [facility] hematopathology.  The neoplastic cells were largely CD30 negative.  Repeat CD30 immunohistochemistry faintly stained > 10% of the large atypical cells. CD20 stained the scattered large atypical cells and few small cells.   She is s/p 2 cycles of brentuximab vedotin (11/23/2017 - 12/21/2017).  She has a normocytic anemia.  Labs on 04/17/2016 revealed an elevated ferritin (444), iron saturation (11%), low TIBC (240), B12 (2409), and folate (10.8).  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  B12 and folate were normal on 12/06/2017.  She was diagnosed with norovirus on 12/06/2017.  She presented with diarrhea.  Diarrhea has resolved.  Symptomatically, patient is fatigued. She complains of pain in her LEFT leg and knee. 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  Patient is depressed regarding her hair loss. Exam reveals a erythematous maculopapular rash to posterior torso and buttocks since Wednesday. No exudate or pruritis. WBC  is 4900 ([place] 2400). Potassium low at 3.1.  Plan: 1. Labs today: CBC with diff, CMP, Mg, LDH, uric acid.  2. Discuss rash to posterior torso and buttocks. Erythematous and non-pustular with a bilateral distribution. Patient seen by Dr. Phillip Heal today for further evaluation prior to treatment. Per dermatology, rash is not consistent with current HZV infection. Advised that it is ok to proceed with treatment as planned.  3. Labs reviewed. Blood counts stable and adequate enough for treatment. Will proceed with cycle #3 brentuximab vedotin. 4. Discuss reimaging studies prior to cycle #4. Schedule PET scan on 01/30/2018. 5. Patient is requesting to have massage.  Advised against deep tissue modality. Ok to proceed. Medical clearance letter for massage therapy provided. 6. Discuss low potassium. Potassium level 3.1 today. Patient taking KCl 10 mEQ BID. Will add an additional 20 mEq dose daily x 5 days. Encouraged patient to speak with PCP (Dr. Enid Derry) about potential change in diuretic therapy. A potassium sparing diuretic may be more beneficial for this patient.  7. Discuss nutrition and weight loss. Patient down 6 pounds. Encouraged to increase intake of protein and calorie dense foods.  8. Discuss symptom management.  Patient has antiemetics and pain medications at home to use on a PRN basis. Patient  advising that the  prescribed interventions are adequate at this point. Continue all medications as previously prescribed.  Discuss neutropenic precautions. Encouraged to avoid crowds and close contacts with sick individuals. Reviewed handwashing precautions.  RTC on 02/01/2018 for MD assessment, labs (CBC with diff, CMP, Mg, LDH, uric acid), review of imaging, and cycle #4 Adcetris.    Honor Loh, NP  01/11/2018, 9:20 AM   I saw and evaluated the patient, participating in the key portions of the service and reviewing pertinent diagnostic studies and records.  I reviewed the nurse practitioner's note and  agree with the findings and the plan.  The assessment and plan were  discussed with the patient.  Multiple questions were asked by the patient and answered.   Nolon Stalls, MD 01/11/2018,9:20 AM

## 2018-01-20 ENCOUNTER — Telehealth: Payer: Self-pay | Admitting: Oncology

## 2018-01-20 NOTE — Telephone Encounter (Signed)
daugther called pt has nausea vomiting and diarrhea.  Used ODT zofran, but throw up right after. Daughter asks if patient can try again with ODT zofran. Instruct daughter that patient may try another ODT Zofran. But if she continues have nausea/votming, she should go to ER to be evaluated. Daughter voices understanding

## 2018-01-28 ENCOUNTER — Other Ambulatory Visit: Payer: Self-pay | Admitting: *Deleted

## 2018-01-28 MED ORDER — ONDANSETRON 4 MG PO TBDP: 4.0000 mg | ORAL_TABLET | Freq: Three times a day (TID) | ORAL | 0 refills | Status: DC | PRN

## 2018-01-29 ENCOUNTER — Encounter
Admission: RE | Admit: 2018-01-29 | Discharge: 2018-01-29 | Disposition: A | Discharge status: Home / Self Care | Payer: Medicare HMO | Source: Ambulatory Visit | Attending: Urgent Care | Admitting: Urgent Care

## 2018-01-29 ENCOUNTER — Telehealth: Payer: Self-pay | Admitting: *Deleted

## 2018-01-29 DIAGNOSIS — C8518 Unspecified B-cell lymphoma, lymph nodes of multiple sites: Secondary | ICD-10-CM

## 2018-01-29 LAB — GLUCOSE, CAPILLARY: Glucose-Capillary: 129 mg/dL — ABNORMAL HIGH (ref 70–99)

## 2018-01-29 MED ORDER — FLUDEOXYGLUCOSE F - 18 (FDG) INJECTION
8.9500 | Freq: Once | INTRAVENOUS | Status: AC | PRN
  Administered 2018-01-29: 8.95 via INTRAVENOUS

## 2018-01-29 NOTE — Telephone Encounter (Signed)
There was no sign language interpreter was ordered for todays appointment, Daughter is translating for her today, but patient requests an interpreter for her appointment Friday when she gets her results

## 2018-01-29 NOTE — Telephone Encounter (Signed)
Confirmed that ASL interpreter is scheduled for appointment on 02/01/2018.

## 2018-02-01 ENCOUNTER — Inpatient Hospital Stay: Payer: Medicare HMO | Admitting: Hematology and Oncology

## 2018-02-01 ENCOUNTER — Inpatient Hospital Stay: Payer: Medicare HMO

## 2018-02-01 ENCOUNTER — Inpatient Hospital Stay
Admission: AD | Admit: 2018-02-01 | Discharge: 2018-02-03 | DRG: 641 | Disposition: A | Discharge status: Home Health | Payer: Medicare HMO | Source: Ambulatory Visit | Attending: Internal Medicine | Admitting: Internal Medicine

## 2018-02-01 ENCOUNTER — Telehealth: Payer: Self-pay | Admitting: *Deleted

## 2018-02-01 ENCOUNTER — Other Ambulatory Visit: Payer: Self-pay

## 2018-02-01 ENCOUNTER — Inpatient Hospital Stay: Payer: Medicare HMO | Attending: Hematology and Oncology

## 2018-02-01 ENCOUNTER — Encounter: Payer: Self-pay | Admitting: Hematology and Oncology

## 2018-02-01 VITALS — BP 116/73 | HR 91 | Temp 97.0°F | Wt 167.5 lb

## 2018-02-01 DIAGNOSIS — Z923 Personal history of irradiation: Secondary | ICD-10-CM

## 2018-02-01 DIAGNOSIS — C8518 Unspecified B-cell lymphoma, lymph nodes of multiple sites: Secondary | ICD-10-CM

## 2018-02-01 DIAGNOSIS — Z9221 Personal history of antineoplastic chemotherapy: Secondary | ICD-10-CM | POA: Insufficient documentation

## 2018-02-01 DIAGNOSIS — Z79899 Other long term (current) drug therapy: Secondary | ICD-10-CM | POA: Diagnosis not present

## 2018-02-01 DIAGNOSIS — H9190 Unspecified hearing loss, unspecified ear: Secondary | ICD-10-CM | POA: Diagnosis present

## 2018-02-01 DIAGNOSIS — E871 Hypo-osmolality and hyponatremia: Secondary | ICD-10-CM | POA: Diagnosis present

## 2018-02-01 DIAGNOSIS — Z801 Family history of malignant neoplasm of trachea, bronchus and lung: Secondary | ICD-10-CM | POA: Diagnosis not present

## 2018-02-01 DIAGNOSIS — E876 Hypokalemia: Secondary | ICD-10-CM | POA: Diagnosis present

## 2018-02-01 DIAGNOSIS — E785 Hyperlipidemia, unspecified: Secondary | ICD-10-CM | POA: Diagnosis present

## 2018-02-01 DIAGNOSIS — M1990 Unspecified osteoarthritis, unspecified site: Secondary | ICD-10-CM | POA: Diagnosis present

## 2018-02-01 DIAGNOSIS — I10 Essential (primary) hypertension: Secondary | ICD-10-CM | POA: Diagnosis present

## 2018-02-01 DIAGNOSIS — Z951 Presence of aortocoronary bypass graft: Secondary | ICD-10-CM | POA: Diagnosis not present

## 2018-02-01 DIAGNOSIS — R200 Anesthesia of skin: Secondary | ICD-10-CM | POA: Insufficient documentation

## 2018-02-01 DIAGNOSIS — Z7982 Long term (current) use of aspirin: Secondary | ICD-10-CM | POA: Diagnosis not present

## 2018-02-01 DIAGNOSIS — C8595 Non-Hodgkin lymphoma, unspecified, lymph nodes of inguinal region and lower limb: Secondary | ICD-10-CM | POA: Diagnosis present

## 2018-02-01 DIAGNOSIS — Z87891 Personal history of nicotine dependence: Secondary | ICD-10-CM | POA: Insufficient documentation

## 2018-02-01 DIAGNOSIS — C3492 Malignant neoplasm of unspecified part of left bronchus or lung: Secondary | ICD-10-CM | POA: Diagnosis not present

## 2018-02-01 DIAGNOSIS — C3432 Malignant neoplasm of lower lobe, left bronchus or lung: Secondary | ICD-10-CM | POA: Insufficient documentation

## 2018-02-01 DIAGNOSIS — N644 Mastodynia: Secondary | ICD-10-CM | POA: Insufficient documentation

## 2018-02-01 DIAGNOSIS — Z806 Family history of leukemia: Secondary | ICD-10-CM

## 2018-02-01 DIAGNOSIS — Z7189 Other specified counseling: Secondary | ICD-10-CM

## 2018-02-01 LAB — CBC WITH DIFFERENTIAL/PLATELET
Basophils Absolute: 0.1 10*3/uL (ref 0–0.1)
Basophils Relative: 1 %
Eosinophils Absolute: 0 10*3/uL (ref 0–0.7)
Eosinophils Relative: 0 %
HCT: 32.5 % — ABNORMAL LOW (ref 35.0–47.0)
Hemoglobin: 11.4 g/dL — ABNORMAL LOW (ref 12.0–16.0)
Lymphocytes Relative: 14 %
Lymphs Abs: 1 10*3/uL (ref 1.0–3.6)
MCH: 30.4 pg (ref 26.0–34.0)
MCHC: 35 g/dL (ref 32.0–36.0)
MCV: 87 fL (ref 80.0–100.0)
Monocytes Absolute: 1.1 10*3/uL — ABNORMAL HIGH (ref 0.2–0.9)
Monocytes Relative: 15 %
Neutro Abs: 5.3 10*3/uL (ref 1.4–6.5)
Neutrophils Relative %: 70 %
Platelets: 541 10*3/uL — ABNORMAL HIGH (ref 150–440)
RBC: 3.74 MIL/uL — ABNORMAL LOW (ref 3.80–5.20)
RDW: 20.1 % — ABNORMAL HIGH (ref 11.5–14.5)
WBC: 7.6 10*3/uL (ref 3.6–11.0)

## 2018-02-01 LAB — COMPREHENSIVE METABOLIC PANEL
ALT: 23 U/L (ref 0–44)
AST: 40 U/L (ref 15–41)
Albumin: 3.4 g/dL — ABNORMAL LOW (ref 3.5–5.0)
Alkaline Phosphatase: 90 U/L (ref 38–126)
Anion gap: 14 (ref 5–15)
BUN: 14 mg/dL (ref 8–23)
CO2: 22 mmol/L (ref 22–32)
Calcium: 8.8 mg/dL — ABNORMAL LOW (ref 8.9–10.3)
Chloride: 88 mmol/L — ABNORMAL LOW (ref 98–111)
Creatinine, Ser: 1.07 mg/dL — ABNORMAL HIGH (ref 0.44–1.00)
GFR calc Af Amer: 58 mL/min — ABNORMAL LOW (ref >60)
GFR calc non Af Amer: 50 mL/min — ABNORMAL LOW (ref >60)
Glucose, Bld: 115 mg/dL — ABNORMAL HIGH (ref 70–99)
Potassium: 3 mmol/L — ABNORMAL LOW (ref 3.5–5.1)
Sodium: 124 mmol/L — ABNORMAL LOW (ref 135–145)
Total Bilirubin: 1.3 mg/dL — ABNORMAL HIGH (ref 0.3–1.2)
Total Protein: 7.2 g/dL (ref 6.5–8.1)

## 2018-02-01 LAB — TSH: TSH: 0.833 u[IU]/mL (ref 0.350–4.500)

## 2018-02-01 LAB — LACTATE DEHYDROGENASE: LDH: 143 U/L (ref 98–192)

## 2018-02-01 LAB — MAGNESIUM: Magnesium: 2 mg/dL (ref 1.7–2.4)

## 2018-02-01 MED ORDER — VITAMIN E 180 MG (400 UNIT) PO CAPS
400.0000 [IU] | ORAL_CAPSULE | Freq: Every day | ORAL | Status: DC
  Administered 2018-02-02 – 2018-02-03 (×2): 400 [IU] via ORAL
  Filled 2018-02-01 (×3): qty 1

## 2018-02-01 MED ORDER — HEPARIN SODIUM (PORCINE) 5000 UNIT/ML IJ SOLN
5000.0000 [IU] | Freq: Three times a day (TID) | INTRAMUSCULAR | Status: DC
  Administered 2018-02-01 – 2018-02-03 (×5): 5000 [IU] via SUBCUTANEOUS
  Filled 2018-02-01 (×5): qty 1

## 2018-02-01 MED ORDER — ALLOPURINOL 300 MG PO TABS
300.0000 mg | ORAL_TABLET | Freq: Every day | ORAL | Status: DC
  Administered 2018-02-01 – 2018-02-02 (×2): 300 mg via ORAL
  Filled 2018-02-01 (×3): qty 1

## 2018-02-01 MED ORDER — HEPARIN SOD (PORK) LOCK FLUSH 100 UNIT/ML IV SOLN: 500.0000 [IU] | Freq: Once | INTRAVENOUS | Status: AC

## 2018-02-01 MED ORDER — FLUOXETINE HCL 20 MG PO CAPS
40.0000 mg | ORAL_CAPSULE | Freq: Every day | ORAL | Status: DC
  Administered 2018-02-02 – 2018-02-03 (×2): 40 mg via ORAL
  Filled 2018-02-01 (×3): qty 2

## 2018-02-01 MED ORDER — ACETAMINOPHEN 325 MG PO TABS
650.0000 mg | ORAL_TABLET | Freq: Four times a day (QID) | ORAL | Status: DC | PRN
  Administered 2018-02-01 – 2018-02-02 (×2): 650 mg via ORAL
  Filled 2018-02-01 (×2): qty 2

## 2018-02-01 MED ORDER — HYDROCODONE-ACETAMINOPHEN 5-325 MG PO TABS
1.0000 | ORAL_TABLET | Freq: Four times a day (QID) | ORAL | Status: DC | PRN
  Administered 2018-02-01 – 2018-02-02 (×2): 1 via ORAL
  Filled 2018-02-01 (×3): qty 1

## 2018-02-01 MED ORDER — PANTOPRAZOLE SODIUM 40 MG PO TBEC
40.0000 mg | DELAYED_RELEASE_TABLET | Freq: Every day | ORAL | Status: DC
  Administered 2018-02-02 – 2018-02-03 (×2): 40 mg via ORAL
  Filled 2018-02-01 (×2): qty 1

## 2018-02-01 MED ORDER — ONDANSETRON 4 MG PO TBDP
4.0000 mg | ORAL_TABLET | Freq: Three times a day (TID) | ORAL | Status: DC | PRN
  Filled 2018-02-01: qty 1

## 2018-02-01 MED ORDER — POTASSIUM CHLORIDE CRYS ER 20 MEQ PO TBCR
20.0000 meq | EXTENDED_RELEASE_TABLET | Freq: Every day | ORAL | Status: DC
  Administered 2018-02-01 – 2018-02-03 (×3): 20 meq via ORAL
  Filled 2018-02-01 (×3): qty 1

## 2018-02-01 MED ORDER — SODIUM CHLORIDE 0.9% FLUSH
10.0000 mL | Freq: Once | INTRAVENOUS | Status: AC
  Administered 2018-02-01: 10 mL via INTRAVENOUS
  Filled 2018-02-01: qty 10

## 2018-02-01 MED ORDER — GABAPENTIN 100 MG PO CAPS
100.0000 mg | ORAL_CAPSULE | Freq: Two times a day (BID) | ORAL | Status: DC
  Administered 2018-02-01 – 2018-02-03 (×4): 100 mg via ORAL
  Filled 2018-02-01 (×4): qty 1

## 2018-02-01 MED ORDER — ATORVASTATIN CALCIUM 80 MG PO TABS
80.0000 mg | ORAL_TABLET | Freq: Every day | ORAL | Status: DC
  Administered 2018-02-01 – 2018-02-02 (×2): 80 mg via ORAL
  Filled 2018-02-01: qty 1
  Filled 2018-02-01: qty 4
  Filled 2018-02-01: qty 1
  Filled 2018-02-01: qty 4
  Filled 2018-02-01: qty 1

## 2018-02-01 MED ORDER — CYANOCOBALAMIN 500 MCG PO TABS
500.0000 ug | ORAL_TABLET | Freq: Every day | ORAL | Status: DC
  Administered 2018-02-02 – 2018-02-03 (×2): 500 ug via ORAL
  Filled 2018-02-01 (×3): qty 1

## 2018-02-01 MED ORDER — DOCUSATE SODIUM 100 MG PO CAPS: 100.0000 mg | ORAL_CAPSULE | Freq: Two times a day (BID) | ORAL | Status: DC | PRN

## 2018-02-01 MED ORDER — VITAMIN D 1000 UNITS PO TABS
2000.0000 [IU] | ORAL_TABLET | Freq: Every day | ORAL | Status: DC
  Administered 2018-02-02 – 2018-02-03 (×2): 2000 [IU] via ORAL
  Filled 2018-02-01 (×2): qty 2

## 2018-02-01 MED ORDER — ASPIRIN EC 81 MG PO TBEC
81.0000 mg | DELAYED_RELEASE_TABLET | Freq: Every day | ORAL | Status: DC
  Administered 2018-02-01 – 2018-02-03 (×3): 81 mg via ORAL
  Filled 2018-02-01 (×3): qty 1

## 2018-02-01 MED ORDER — POTASSIUM CHLORIDE IN NACL 40-0.9 MEQ/L-% IV SOLN
INTRAVENOUS | Status: AC
  Administered 2018-02-01: 15:00:00 75 mL/h via INTRAVENOUS
  Filled 2018-02-01: qty 1000

## 2018-02-01 NOTE — H&P (Signed)
[facility] [place] NAME: Toni Griffith    MR#:  [id number]  DATE OF BIRTH:  [birth year]  DATE OF ADMISSION:  02/01/2018  PRIMARY CARE PHYSICIAN: Dionne Bucy., MD   REQUESTING/REFERRING PHYSICIAN: [place]  CHIEF COMPLAINT:  No chief complaint on file.   HISTORY OF PRESENT ILLNESS: Toni Griffith  is a 77 y.o. female with a known history of lymphoma, hypertension, deaf and understands sign language-had a recurrence of lymphoma after getting chemo and radiation in 2017.  She is on chemotherapy for last 2 months. She was admitted with hyponatremia secondary to persistent vomiting and diarrhea, 10 days ago at [facility].  She recovered and was advised to stop taking hydrochlorothiazide for hypertension and started on chlorthalidone. She did not had any complaints, her nausea and vomiting is getting better.  She went to oncology clinic to have her scheduled chemotherapy today.  When they checked her sodium and potassium, that was running low and so she was advised to go to the hospital as direct admission.  PAST MEDICAL HISTORY:   Past Medical History:  Diagnosis Date  . Arthritis   . Cancer [facility])    lymphoma-right hip  . Deaf   . Hypertension     PAST SURGICAL HISTORY:  Past Surgical History:  Procedure Laterality Date  . CARDIAC SURGERY  1993  . CORONARY ARTERY BYPASS GRAFT    . CYST REMOVAL HAND    . PERIPHERAL VASCULAR CATHETERIZATION N/A 04/06/2016   Procedure: Glori Luis Cath Insertion;  Surgeon: Algernon Huxley, MD;  Location: [facility];  Service: Cardiovascular;  Laterality: N/A;    SOCIAL HISTORY:  Social History   Tobacco Use  . Smoking status: Former Smoker    Last attempt to quit: 11/21/2015    Years since quitting: 2.2  . Smokeless tobacco: Never Used  Substance Use Topics  . Alcohol use: Yes    Comment: rare    FAMILY HISTORY:  Family History  Problem Relation Age of Onset  . Lung cancer  Mother   . Lung cancer Father   . Diabetes Maternal Uncle   . Myasthenia gravis Maternal Grandmother   . Leukemia Maternal Grandfather     DRUG ALLERGIES: No Known Allergies  REVIEW OF SYSTEMS:   CONSTITUTIONAL: No fever, no fatigue or weakness.  EYES: No blurred or double vision.  EARS, NOSE, AND THROAT: No tinnitus or ear pain.  RESPIRATORY: No cough, shortness of breath, wheezing or hemoptysis.  CARDIOVASCULAR: No chest pain, orthopnea, edema.  GASTROINTESTINAL: No nausea, vomiting, diarrhea or abdominal pain.  GENITOURINARY: No dysuria, hematuria.  ENDOCRINE: No polyuria, nocturia,  HEMATOLOGY: No anemia, easy bruising or bleeding SKIN: No rash or lesion. MUSCULOSKELETAL: No joint pain or arthritis.   NEUROLOGIC: No tingling, numbness, weakness.  PSYCHIATRY: No anxiety or depression.   MEDICATIONS AT HOME:  Prior to Admission medications   Medication Sig Start Date End Date Taking? Authorizing Provider  acetaminophen (TYLENOL) 325 MG tablet Take 650 mg by mouth.    [provider]  acyclovir cream (ZOVIRAX) 5 % Apply 1 application topically every 4 (four) hours. 12/21/17   Karen Kitchens, NP  allopurinol (ZYLOPRIM) 300 MG tablet TAKE 1 TABLET BY MOUTH EVERY DAY 11/14/17   Karen Kitchens, NP  aspirin EC 81 MG tablet Take 81 mg by mouth daily.    [provider]  atorvastatin (LIPITOR) 80 MG tablet Take 80 mg by mouth. 10/14/15  02/01/18  [provider]  B Complex Vitamins (B COMPLEX 1 PO) Take by mouth.     [provider]  celecoxib (CELEBREX) 200 MG capsule  04/27/17   [provider]  chlorthalidone (HYGROTON) 25 MG tablet Take by mouth. 01/16/18   [provider]  Cholecalciferol (VITAMIN D3) 2000 units capsule Take 2,000 Units by mouth daily.    [provider]  FLUoxetine (PROZAC) 40 MG capsule  04/20/17   [provider]  gabapentin (NEURONTIN) 100 MG capsule Take 1 capsule (100 mg total) by mouth 2 (two)  times daily. Patient not taking: Reported on 01/11/2018 11/14/17   Karen Kitchens, NP  HYDROcodone-acetaminophen (NORCO) 5-325 MG tablet Take 1 tablet by mouth every 6 (six) hours as needed for moderate pain. 12/21/17   Karen Kitchens, NP  lidocaine-prilocaine (EMLA) cream Apply cream 1 hour before chemotherapy treatment and place small peive of saran wrap over cream to protect clothing 04/06/16   Lequita Asal, MD  MAGNESIUM OXIDE 400 PO Take by mouth.    [provider]  naproxen sodium (ANAPROX) 220 MG tablet Take 220 mg by mouth as needed.    [provider]  ondansetron (ZOFRAN ODT) 4 MG disintegrating tablet Take 1 tablet (4 mg total) by mouth every 8 (eight) hours as needed for nausea or vomiting. 01/28/18   Lequita Asal, MD  pantoprazole (PROTONIX) 40 MG tablet Take 40 mg by mouth. 02/04/16   [provider]  potassium chloride SA (K-DUR,KLOR-CON) 20 MEQ tablet Take 1 tablet (20 mEq total) by mouth daily. Then as directed by oncology office. Patient taking differently: Take 10 mEq by mouth 2 (two) times daily. Then as directed by oncology office. 01/11/18   Karen Kitchens, NP  vitamin B-12 (CYANOCOBALAMIN) 500 MCG tablet Take 500 mcg by mouth daily.    [provider]  vitamin E 400 UNIT capsule Take by mouth.    [provider]      PHYSICAL EXAMINATION:   VITAL SIGNS: Blood pressure (!) 162/96, pulse 86, temperature 98.5 F (36.9 C), temperature source Oral, resp. rate 20, SpO2 98 %.  GENERAL:  77 y.o.-year-old patient lying in the bed with no acute distress.  EYES: Pupils equal, round, reactive to light and accommodation. No scleral icterus. Extraocular muscles intact.  HEENT: Head atraumatic, normocephalic. Oropharynx and nasopharynx clear.  NECK:  Supple, no jugular venous distention. No thyroid enlargement, no tenderness.  LUNGS: Normal breath sounds bilaterally, no wheezing, rales,rhonchi or crepitation. No use of accessory muscles  of respiration.  CARDIOVASCULAR: S1, S2 normal. No murmurs, rubs, or gallops.  ABDOMEN: Soft, nontender, nondistended. Bowel sounds present. No organomegaly or mass.  EXTREMITIES: No pedal edema, cyanosis, or clubbing.  NEUROLOGIC: Cranial nerves II through XII are intact. Muscle strength 4 /5 in all extremities. Sensation intact. Gait not checked.  PSYCHIATRIC: The patient is alert and oriented x 3.  SKIN: No obvious rash, lesion, or ulcer.   LABORATORY PANEL:   CBC Recent Labs  Lab 02/01/18 0913  WBC 7.6  HGB 11.4*  HCT 32.5*  PLT 541*  MCV 87.0  MCH 30.4  MCHC 35.0  RDW 20.1*  LYMPHSABS 1.0  MONOABS 1.1*  EOSABS 0.0  BASOSABS 0.1   ------------------------------------------------------------------------------------------------------------------  Chemistries  Recent Labs  Lab 02/01/18 0913  NA 124*  K 3.0*  CL 88*  CO2 22  GLUCOSE 115*  BUN 14  CREATININE 1.07*  CALCIUM 8.8*  MG 2.0  AST 40  ALT 23  ALKPHOS 90  BILITOT 1.3*   ------------------------------------------------------------------------------------------------------------------ estimated creatinine clearance is 45 mL/min (A) (by C-G formula based on SCr of 1.07 mg/dL (H)). ------------------------------------------------------------------------------------------------------------------ Recent Labs    02/01/18 1422  TSH 0.833     Coagulation profile No results for input(s): INR, PROTIME in the last 168 hours. ------------------------------------------------------------------------------------------------------------------- No results for input(s): DDIMER in the last 72 hours. -------------------------------------------------------------------------------------------------------------------  Cardiac Enzymes No results for input(s): CKMB, TROPONINI, MYOGLOBIN in the last 168 hours.  Invalid input(s):  CK ------------------------------------------------------------------------------------------------------------------ Invalid input(s): POCBNP  ---------------------------------------------------------------------------------------------------------------  Urinalysis    Component Value Date/Time   COLORURINE YELLOW (A) 04/28/2016 1510   APPEARANCEUR HAZY (A) 04/28/2016 1510   LABSPEC 1.014 04/28/2016 1510   PHURINE 5.0 04/28/2016 1510   GLUCOSEU >500 (A) 04/28/2016 1510   HGBUR NEGATIVE 04/28/2016 1510   BILIRUBINUR NEGATIVE 04/28/2016 1510   KETONESUR NEGATIVE 04/28/2016 1510   PROTEINUR 30 (A) 04/28/2016 1510   NITRITE NEGATIVE 04/28/2016 1510   LEUKOCYTESUR NEGATIVE 04/28/2016 1510     RADIOLOGY: No results found.  EKG: No orders found for this or any previous visit.  IMPRESSION AND PLAN:  * Recurrent hyponatremia Stop chlorthalidone. Continue IV fluids. Checked TSH, normal.  *Hypokalemia Magnesium is normal, replace oral and IV.  *Lymphoma She is currently following at cancer center and getting chemotherapy, advised to continue the same.  *Hyperlipidemia Continue atorvastatin.   All the records are reviewed and case discussed with ED provider. Management plans discussed with the patient, family and they are in agreement.  CODE STATUS: Full code.    Code Status Orders  (From admission, onward)        Start     Ordered   02/01/18 1320  Full code  Continuous     02/01/18 1319    Code Status History    This patient has a current code status but no historical code status.     Her son and daughter are present in the room during my visit. Patient was interviewed by help of sign language translator.  TOTAL TIME TAKING CARE OF THIS PATIENT: 50 minutes.    [name] M.D on 02/01/2018   Between 7am to 6pm - Pager - [phone]  After 6pm go to [email or web address] - password [email or web address] [email or web address]  Office   [phone]  CC: Primary care physician; Dionne Bucy., MD   Note: This dictation was prepared with Dragon dictation along with smaller phrase technology. Any transcriptional errors that result from this process are unintentional.

## 2018-02-01 NOTE — Plan of Care (Signed)
Pt is a direct admission from the cancer center. VSS. IVF infusing. Denies pain/n/v. Sign language interpreter utilized. Family at the bedside.

## 2018-02-01 NOTE — Progress Notes (Signed)
Family Meeting Note  Advance Directive:yes  Today a meeting took place with the Patient and son and daughter.  The following clinical team members were present during this meeting:MD  The following were discussed:Patient's diagnosis: lymphoma , Patient's progosis: Unable to determine and Goals for treatment: Full Code  Additional follow-up to be provided: Cancer center  Time spent during discussion:20 minutes  [name], MD

## 2018-02-01 NOTE — Progress Notes (Signed)
   02/01/18 1350  Clinical Encounter Type  Visited With Patient and family together  Visit Type Initial;Spiritual support  Referral From Nurse  Consult/Referral To [facility]   [place] responded to a request for information on the AD. Ch gave paperwork to PT's son and educated him on the AD process. PT is deaf and communicates through Sign Language. PT and her son was informed that a translator would be needed, other than the PT's sons. Son agreed. Ch will follow up when needed.

## 2018-02-01 NOTE — Progress Notes (Signed)
[facility] day:  02/01/2018   Chief Complaint: Toni Griffith is a 77 y.o. female with stage IVB gray zone lymphoma who is seen for review of interval PET scan and assessment prior to cycle #4 brentuximab vedotin (Adcetris).  HPI:  The patient was last seen in the medical oncology clinic on 01/11/2018.  At that time, she was fatigued. She complained of pain in her LEFT leg and knee. 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  She was depressed regarding her hair loss. Exam revealed a erythematous maculopapular rash to posterior torso and buttocks. WBC was 4900 (Harlan 2400). Potassium was low at 3.1.  She was seen by dermatology.  She was felt not to have shingles.  Her daughter states that it was a drug rash.  She received cycle #3 brentuximab vedotin.  She received IV + oral potassium.  PET scan on 01/29/2018 revealed persistent and progressive areas of hypermetabolism within the right submandibular gland, lingula of the left lung, left common iliac nodal chain, and right lumbar paraspinal soft tissues. Findings were compatible with Deauville criteria 4 and 5.  There was a new nodule is identified within the lingula which demonstrated intense uptake compatible with Deauville criteria 5.  There was aortic atherosclerosis and LAD coronary artery atherosclerotic calcifications, bilateral adrenal gland adenomas and a moderate size hiatal hernia.  The patient was admitted to [facility] at [facility] from 01/20/2018 - 01/23/2018.  She presented with nausea, vomiting, diarrhea and generalized weakness. Work-up showed hypokalemia, hypomagnesemia, dehydration and UTI.  She was treated with IV fluids and empiric IV antibiotics. Potassium and magnesium were repleted. Urine culture showed E. coli.  She was discharged on Augmentin.  Her anti-hypertensive was switched from HCTZ to chorthalidone.  Discharge sodium was 137.  She then  describes being sick again on 01/26/2018 and 01/27/2018.  She describes being unable to swallow and gagging.  She had no solids x 3 days.  She drank Gatoraide and coconut water.  She denies nausea, vomiting or diarrhea.  She has lost 9 pounds since last visit.  Symptomatically, she is feeling good.   Past Medical History:  Diagnosis Date  . Arthritis   . Cancer [facility])    lymphoma-right hip  . Deaf   . Hypertension     Past Surgical History:  Procedure Laterality Date  . CARDIAC SURGERY  1993  . CORONARY ARTERY BYPASS GRAFT    . CYST REMOVAL HAND    . PERIPHERAL VASCULAR CATHETERIZATION N/A 04/06/2016   Procedure: Glori Luis Cath Insertion;  Surgeon: Algernon Huxley, MD;  Location: [place] [facility];  Service: Cardiovascular;  Laterality: N/A;    Family History  Problem Relation Age of Onset  . Lung cancer Mother   . Lung cancer Father   . Diabetes Maternal Uncle   . Myasthenia gravis Maternal Grandmother   . Leukemia Maternal Grandfather     Social History:  reports that she quit smoking about 2 years ago. She has never used smokeless tobacco. She reports that she drinks alcohol. She reports that she does not use drugs.  She smoked 1 1/2 packs per day for 20 years (30 pack year smoking history) until 10/2015.  She is hearing impaired (deaf).  She lives in [place] with her boyfriend.  Her daughter lives in [place].  Her daughter, [name]'s contact number is [phone].  The patient is accompanied her daughter, son, and  the interpreter.  Allergies: No Known Allergies  Current Medications: No current facility-administered medications for this visit.    No current outpatient medications on file.   Facility-Administered Medications Ordered in Other Visits  Medication Dose Route Frequency Provider Last Rate Last Dose  . acetaminophen (TYLENOL) tablet 650 mg  650 mg Oral Q6H PRN [name], MD   650 mg at 02/01/18 2011  . allopurinol (ZYLOPRIM) tablet 300 mg  300 mg Oral  Daily [name], MD   300 mg at 02/01/18 2230  . aspirin EC tablet 81 mg  81 mg Oral Daily [name], MD   81 mg at 02/01/18 1515  . atorvastatin (LIPITOR) tablet 80 mg  80 mg Oral q1800 [name], MD   80 mg at 02/01/18 1716  . cholecalciferol (VITAMIN D) tablet 2,000 Units  2,000 Units Oral Daily [name], MD      . docusate sodium (COLACE) capsule 100 mg  100 mg Oral BID PRN [name], MD      . FLUoxetine (PROZAC) capsule 40 mg  40 mg Oral Daily [name], MD      . gabapentin (NEURONTIN) capsule 100 mg  100 mg Oral BID [name], MD   100 mg at 02/01/18 2007  . heparin injection 5,000 Units  5,000 Units Subcutaneous Q8H [name], MD   5,000 Units at 02/01/18 2008  . heparin lock flush 100 unit/mL  500 Units Intravenous Once Lequita Asal, MD      . HYDROcodone-acetaminophen (NORCO/VICODIN) 5-325 MG per tablet 1 tablet  1 tablet Oral Q6H PRN Max Sane, MD   1 tablet at 02/01/18 2103  . ondansetron (ZOFRAN-ODT) disintegrating tablet 4 mg  4 mg Oral Q8H PRN [name], MD      . pantoprazole (PROTONIX) EC tablet 40 mg  40 mg Oral Daily [name], MD      . potassium chloride SA (K-DUR,KLOR-CON) CR tablet 20 mEq  20 mEq Oral Daily [name], MD   20 mEq at 02/01/18 1515  . sodium chloride flush (NS) 0.9 % injection 10 mL  10 mL Intravenous PRN Nolon Stalls C, MD   10 mL at 04/05/17 1034  . vitamin B-12 (CYANOCOBALAMIN) tablet 500 mcg  500 mcg Oral Daily [name], MD      . vitamin E capsule 400 Units  400 Units Oral Daily [name], MD        Review of Systems  Constitutional: Positive for malaise/fatigue and weight loss (down 9 pounds). Negative for chills and fever.       Feeling better since discharge from the hospital.  HENT: Positive for hearing loss (Deaf). Negative for congestion, ear discharge, ear pain,  nosebleeds, sinus pain and sore throat.   Eyes: Negative.  Negative for double vision, pain and discharge.  Respiratory: Negative.  Negative for cough, hemoptysis, sputum production and shortness of breath.   Cardiovascular: Negative.  Negative for chest pain, palpitations, orthopnea, leg swelling and PND.  Gastrointestinal: Negative.  Negative for abdominal pain, blood in stool, constipation, diarrhea (resolved), melena, nausea and vomiting.  Genitourinary: Negative.  Negative for dysuria, frequency, hematuria and urgency.  Musculoskeletal: Positive for joint pain (hips; left knee). Negative for falls and myalgias.  Skin: Positive for rash. Negative for itching.       Chemotherapy induced very mild hair loss  Neurological: Negative for dizziness, focal weakness and weakness.       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  Does not bruise/bleed easily.  Psychiatric/Behavioral: Positive for depression. Negative for memory loss and suicidal ideas. The patient is not nervous/anxious and does not have insomnia.   All other systems reviewed and are negative.  Performance status (ECOG): 2 - Symptomatic, <50% confined to bed  Physical Exam:  Blood pressure 116/73, pulse 91, temperature (!) 97 F (36.1 C), temperature source Oral, weight 167 lb 8 oz (76 kg). GENERAL:  Elderly woamn sitting comfortably in a wheelchair in the exam room in no acute distress. MENTAL STATUS:  Alert and oriented to person, place and time. HEAD:  Thinning curly gray hair.  Normocephalic, atraumatic, face symmetric, no Cushingoid features. EYES:  Hazel eyes.  Pupils equal round and reactive to light and accomodation.  No conjunctivitis or scleral icterus. ENT:  Oropharynx clear without lesion.  Tongue normal. Mucous membranes moist.  RESPIRATORY:  Clear to auscultation without rales, wheezes or rhonchi. CARDIOVASCULAR:  Regular rate and rhythm without murmur, rub or gallop. ABDOMEN:   Soft, non-tender, with active bowel sounds, and no hepatosplenomegaly.  No masses. SKIN:  No rashes, ulcers or lesions. EXTREMITIES: No edema, no skin discoloration or tenderness.  No palpable cords. LYMPH NODES: No palpable cervical, supraclavicular, axillary or inguinal adenopathy  NEUROLOGICAL: Unremarkable. PSYCH:  Appropriate.    Radiology studies: 02/20/2016:  Chest, abdomen and pelvic CT revealed a 3.7 cm subpleural mass within the left lower lobe, a 1.1 cm right lower lobe subpleural nodule , 9 mm RUL nodule, and 0.4 cm lingular nodule.  Between the left second, third, and fourth ribs there was 1.9 cm soft tissue mass. There was a 2.5 cm low-attenuation nodule in the left lobe of the thyroid.   There were prominent left axillary nodes (up to 1.0 cm).   There was no definite mediastinal adenopathy. There was a 5.5 x 4.6 cm cystic lesion in the left hepatic lobe.  There was a 3.1 cm nodule in the right adrenal gland. There was a 1.2 cm right retrocaval lymph node with additional prominent subcentimeter retroperitoneal nodes. There was 1.0 cm left common iliac node. There was a 6.2 x 5.2 cm soft tissue mass within the right ilium.  02/21/2016:  Head MRI revealed no evidence of malignancy. Thyroid ultrasound on 02/21/2016 revealed a 2.4 cm solid nodule in the inferior aspect of the left lobe 03/03/2016:  PET scan revealed a hypermetabolic left lower lobe mass, bilateral pleural and pulmonary parenchymal nodular metastasis, a large pleural lesion invading the anterior left chest wall.   There was activity in the superior left ocular orbit.  There was a large lesion centered in the right iliac wing with soft tissue extension.  There were hypermetabolic right external iliac lymph nodes 06/06/2016:  PET scan revealed significant interval response to therapy.  There was significant decrease in size and FDG uptake associated with left lower lobe lung lesion.  There was resolution of left-sided pleural base  metastasis and left axillary and anterior mediastinal nodal metastasis.  There was resolution of previous hypermetabolic soft tissue metastasis/nodal metastasis posterior to the IVC.  There was marked improvement in hypermetabolic metastasis involving the right iliac wing.  There was persistent right upper lobe lung nodule which exhibits intense radiotracer uptake which may represent a focus of metastatic disease or synchronous primary lung neoplasm. 06/01/2017:  PET scan revealed no new or progressive findings.  The 9 mm RUL pulmonary nodule remained hypermetabolic (SUV 56.2, previously 7.3) and no change in size.  The index lesion in the  posterior left lower lobe was minimally smaller on CT imaging with very low level FDG uptake (SUV 1.6).  There was no change in the appearance at site of previous disease at the right iliac wing (SUV 1.9 compared to 2.6 previously). 01/29/2017:  PET scan revealed interval progression of hypermetabolic nodules in the right upper (13 mm compared to 9 mm; SUV 10) and left lower lobes (26 mm compared to 22 mm; SUV 7.5).  There was new hypermetabolic focus of activity along the anterior left pleura although no underlying pleural or lung mass was evident. 09/18/2017:  PET scan revealed a mixed appearance, with marked improvement in the prior 2 pulmonary nodules; but with new Deauville 5 left common iliac lymph node enlargement; new Deauville 5 right paraspinal lesion; and new Deauville 4 left infrahilar nodal activity.  There was a focus of accentuated metabolic activity which had been present along the right lower submandibular gland but currently seemed to project over the subcutaneous tissues without CT correlate. Current maximum SUV 10.5, previously 9.7.  There was increased accentuated activity along the left lateral spurring at L2-3. While typically such activity is inflammatory due to spurring, this had increased significantly since the prior exam and merit surveillance. There  was a new 8 by 5 mm right upper lobe pulmonary nodule, Deauville 2, which also merits surveillance. 01/29/2018:  PET scan revealed persistent and progressive areas of hypermetabolism within the right submandibular gland, lingula of the left lung, left common iliac nodal chain, and right lumbar paraspinal soft tissues. Findings were compatible with Deauville criteria 4 and 5.  There was a new nodule is identified within the lingula which demonstrated intense uptake compatible with Deauville criteria 5.    Assessment:  Jessilyn Catino is a 77 y.o. female with stage IVB B-cell lymphoma, unclassifiable, with features intermediate between diffuse large B-cell lymphoma and classical Hodgkin's lymphoma ("gray zone" lymphoma).  She underwent right iliac wing bone/bone marrow biopsy on 03/08/2016.  Head MRI on 02/21/2016 revealed no evidence of malignancy. 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   There was activity in the superior left ocular orbit.  There was a large lesion centered in the right iliac wing with soft tissue extension.  There were hypermetabolic right external iliac lymph nodes  She was [facility] to [facility] in [facility].  She had renal failure and hypercalcemia. She was treated with IVF, calcitonin and Zometa.The patient has a history of coronary artery disease s/p CABG in 1993.  She has no history of heart failure.  She is deaf and requires an interpreter.  Echo on 04/04/2016 revealed an EF of 55-60%.  Hepatitis B and C testing was negative on 03/30/2016.   G6PD assay was normal.  She declined LP with MTX prophylaxis.  She received 6 cycles of mini-RCHOP (04/07/2016 - 07/21/2016).  She has had some transient fingertip  numbness.  She received radiation to the right iliac wing,  from 10/11/2016 - 11/01/2016.  She received 3000 cGy over 3 weeks.  PET scan on 01/29/2017 revealed interval progression of hypermetabolic nodules in the right upper (13 mm compared to 9 mm; SUV 10) and left lower lobes (26 mm compared to 22 mm; SUV 7.5).  There was new hypermetabolic focus of activity along the anterior  left pleura although no underlying pleural or lung mass was evident.  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  She underwent SBRT 03/26/2017 - 04/09/2017.  She received 5000 cGy in 5 fractions to the left lower lobe lesion.  She underwent SBRT to a RUL lesion from 05/23/2017 - 06/18/2017.  PET scan on 09/18/2017 revealed a mixed appearance, with marked improvement in the prior 2 pulmonary nodules; but with new Deauville 5 left common iliac lymph node enlargement; new Deauville 5 right paraspinal lesion; and new Deauville 4 left infrahilar nodal activity.  There was a focus of accentuated metabolic activity which had been present along the right lower submandibular gland but currently seemed to project over the subcutaneous tissues without CT correlate. Current maximum SUV 10.5, previously 9.7. There was increased accentuated activity along the left lateral spurring at L2-3. While typically such activity is inflammatory due to spurring, this had increased significantly since the prior exam and merit surveillance. There was a new 8 by 5 mm right upper lobe pulmonary nodule, Deauville 2.  CT guided right paraspinal lesion biopsy on 10/16/2017 was compatible with involvement by the patient's previously diagnosed lymphoma.  The case was referred to [facility] hematopathology.  The neoplastic cells were largely CD30 negative.  Repeat CD30 immunohistochemistry faintly stained > 10% of the large atypical cells. CD20 stained the scattered large atypical cells and few small cells.   She received  3 cycles of brentuximab vedotin (11/23/2017 - 01/11/2018).  PET scan on 01/29/2018 revealed persistent and progressive areas of hypermetabolism within the right submandibular gland, lingula of the left lung, left common iliac nodal chain, and right lumbar paraspinal soft tissues. Findings were compatible with Deauville criteria 4 and 5.  There was a new nodule is identified within the lingula which demonstrated intense uptake compatible with Deauville criteria 5.   She has a normocytic anemia.  Labs on 04/17/2016 revealed an elevated ferritin (444), iron saturation (11%), low TIBC (240), B12 (2409), and folate (10.8).  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  B12 and folate were normal on 12/06/2017.  She was diagnosed with norovirus on 12/06/2017.  She presented with diarrhea.  Diarrhea has resolved.  She was admitted to [facility] at [facility] from 01/20/2018 - 01/23/2018.  She presented with nausea, vomiting, diarrhea and generalized weakness. Work-up showed hypokalemia, hypomagnesemia, dehydration and UTI.  She was treated with IV fluids and empiric IV antibiotics. Potassium and magnesium were repleted. Urine culture showed E. coli.  She was discharged on Augmentin. She was switched from HCTZ to chorthalidone.  Symptomatically, she is feeling better. She has lost 9 pounds.  Sodium is 124.  Potassium is 3.0.  Plan: 1. Labs today: CBC with diff, CMP, Mg, LDH, uric acid. 2. Review PET scan- progressive disease.  Images reviewed with patient and family.  She has progressive lymphoma (right paraspinal area previously biopsied and pathology + lymphoma).  She may also have lung cancer given the PET + lesion in the lingula of the left lung (LLL nodule previously biopsy + for squamous cell lung cancer).  Discuss presentation at tumor board on 02/07/2018.  Discuss possible biopsy(ies) for confirmation. 3. Discuss consideration of bendamustine + Rituxan for  recurrent gray zone lymphoma.  She would require dose reduction of bendamustine (70 mg/m2) as she is older than 42.  She is not a stem cell transplant candidate.  Multiple questions were asked and answered.  4. Discontinue brentuximab vedotin (Adcetris). 5. Phone follow-up with Dr. Corey Harold [phone]; [phone]). 6. Present at tumor board on 02/07/2018. 7. Discuss admission to the hospital secondary to hyponatremia and hypokalemia.  She has been dehydrated and on a thiazide-related diuretic.  A total of (40+) minutes of face-to-face time was spent with the patient with greater than 50% of that time in counseling and care-coordination.    Lequita Asal, MD  02/01/2018, 3:35 PM

## 2018-02-01 NOTE — Telephone Encounter (Signed)
Patient direct admitted to [facility].  Admitted for hyponatremia (124) and hypokalemia (3).  Report called to [facility], RN on 1-C.

## 2018-02-02 ENCOUNTER — Other Ambulatory Visit: Payer: Self-pay | Admitting: Hematology and Oncology

## 2018-02-02 LAB — BASIC METABOLIC PANEL
Anion gap: 10 (ref 5–15)
BUN: 13 mg/dL (ref 8–23)
CHLORIDE: 95 mmol/L — AB (ref 98–111)
CO2: 26 mmol/L (ref 22–32)
Calcium: 8.6 mg/dL — ABNORMAL LOW (ref 8.9–10.3)
Creatinine, Ser: 0.83 mg/dL (ref 0.44–1.00)
GFR calc non Af Amer: >60 mL/min (ref >60)
Glucose, Bld: 109 mg/dL — ABNORMAL HIGH (ref 70–99)
POTASSIUM: 3.7 mmol/L (ref 3.5–5.1)
SODIUM: 131 mmol/L — AB (ref 135–145)

## 2018-02-02 LAB — CBC
HEMATOCRIT: 32.2 % — AB (ref 35.0–47.0)
Hemoglobin: 11 g/dL — ABNORMAL LOW (ref 12.0–16.0)
MCH: 29.9 pg (ref 26.0–34.0)
MCHC: 34.1 g/dL (ref 32.0–36.0)
MCV: 87.4 fL (ref 80.0–100.0)
Platelets: 507 10*3/uL — ABNORMAL HIGH (ref 150–440)
RBC: 3.68 MIL/uL — AB (ref 3.80–5.20)
RDW: 20.1 % — ABNORMAL HIGH (ref 11.5–14.5)
WBC: 8.3 10*3/uL (ref 3.6–11.0)

## 2018-02-02 MED ORDER — SODIUM CHLORIDE 0.9 % IV SOLN
INTRAVENOUS | Status: DC
  Administered 2018-02-02: 10:00:00 via INTRAVENOUS

## 2018-02-02 MED ORDER — POTASSIUM CHLORIDE IN NACL 20-0.9 MEQ/L-% IV SOLN: INTRAVENOUS | Status: DC

## 2018-02-02 NOTE — Progress Notes (Signed)
[place] [facility] [place] NAME: Tyrone Balash    MR#:  [id number]  DATE OF BIRTH:  [birth year]  SUBJECTIVE:   Via video interpreter. Patient is deaf. Meet with increasing weakness. She was found to have low sodium and potassium at the oncology clinic. Feels a lot better today. Eating good.  REVIEW OF SYSTEMS:   Review of Systems  Constitutional: Negative for chills, fever and weight loss.  HENT: Negative for ear discharge, ear pain and nosebleeds.   Eyes: Negative for blurred vision, pain and discharge.  Respiratory: Negative for sputum production, shortness of breath, wheezing and stridor.   Cardiovascular: Negative for chest pain, palpitations, orthopnea and PND.  Gastrointestinal: Negative for abdominal pain, diarrhea, nausea and vomiting.  Genitourinary: Negative for frequency and urgency.  Musculoskeletal: Positive for joint pain. Negative for back pain.  Neurological: Negative for sensory change, speech change, focal weakness and weakness.  Psychiatric/Behavioral: Negative for depression and hallucinations. The patient is not nervous/anxious.    Tolerating Diet:yes Tolerating PT: pending  DRUG ALLERGIES:  No Known Allergies  VITALS:  Blood pressure 113/65, pulse 84, temperature 98.2 F (36.8 C), temperature source Oral, resp. rate 18, height 5\' 2"  (1.575 m), weight 76.2 kg (167 lb 14.4 oz), SpO2 97 %.  PHYSICAL EXAMINATION:   Physical Exam  GENERAL:  77 y.o.-year-old patient lying in the bed with no acute distress.  EYES: Pupils equal, round, reactive to light and accommodation. No scleral icterus. Extraocular muscles intact.  HEENT: Head atraumatic, normocephalic. Oropharynx and nasopharynx clear.  NECK:  Supple, no jugular venous distention. No thyroid enlargement, no tenderness.  LUNGS: Normal breath sounds bilaterally, no wheezing, rales, rhonchi. No use of accessory muscles of respiration.  CARDIOVASCULAR: S1, S2  normal. No murmurs, rubs, or gallops.  ABDOMEN: Soft, nontender, nondistended. Bowel sounds present. No organomegaly or mass.  EXTREMITIES: No cyanosis, clubbing or edema b/l.   Arthritis changes NEUROLOGIC: Cranial nerves II through XII are intact. No focal Motor or sensory deficits b/l.   PSYCHIATRIC:  patient is alert and oriented x 3.  SKIN: No obvious rash, lesion, or ulcer.   LABORATORY PANEL:  CBC Recent Labs  Lab 02/02/18 0547  WBC 8.3  HGB 11.0*  HCT 32.2*  PLT 507*    Chemistries  Recent Labs  Lab 02/01/18 0913 02/02/18 0547  NA 124* 131*  K 3.0* 3.7  CL 88* 95*  CO2 22 26  GLUCOSE 115* 109*  BUN 14 13  CREATININE 1.07* 0.83  CALCIUM 8.8* 8.6*  MG 2.0  --   AST 40  --   ALT 23  --   ALKPHOS 90  --   BILITOT 1.3*  --    Cardiac Enzymes No results for input(s): TROPONINI in the last 168 hours. RADIOLOGY:  No results found. ASSESSMENT AND PLAN:  Keimora Swartout  is a 77 y.o. female with a known history of lymphoma, hypertension, deaf and understands sign language-had a recurrence of lymphoma after getting chemo and radiation in 2017.  She is on chemotherapy for last 2 months. Recently started on closer [place]. She was found to have low sodium and potassium at the oncology clinical routine labs.  * Recurrent hyponatremia Stop chlorthalidone. Continue IV fluids. Checked TSH, normal. Came in with sodium of 124--- IV fluids 131 -feels a whole lot better  *Hypokalemia Magnesium is normal, replace oral and IV. -Potassium 3.7  *Lymphoma She is currently following at cancer center and getting chemotherapy, advised to  continue the same.  *Hyperlipidemia Continue atorvastatin.     Case discussed with Care Management/Social Worker. Management plans discussed with the patient, family and they are in agreement.  CODE STATUS: full  DVT Prophylaxis: lovenox  TOTAL TIME TAKING CARE OF THIS PATIENT: *30* minutes.  >50% time spent on counselling and  coordination of care  POSSIBLE D/C IN *1-2* DAYS, DEPENDING ON CLINICAL CONDITION.  Note: This dictation was prepared with Dragon dictation along with smaller phrase technology. Any transcriptional errors that result from this process are unintentional.  Fritzi Mandes M.D on 02/02/2018 at 11:48 AM  Between 7am to 6pm - Pager - [phone]  After 6pm go to [email or web address] - password [email or web address] [facility]  Office  [phone]  CC: Primary care physician; Dionne Bucy., MDPatient ID: Brett Canales, female   DOB: February 06, 1941, 77 y.o.   MRN: [id number]

## 2018-02-02 NOTE — Evaluation (Signed)
Physical Therapy Evaluation Patient Details Name: Toni Griffith MRN: [id number] DOB: 11-Jan-1941 Today's Date: 02/02/2018   History of Present Illness  Pt admitted hyponatremia. Recent admission at another health care facility for similar symptoms. HIstory includes lymphoma currently on chemo and HTN. Pt also deaf and uses sign language. Interpreter used on iPad for evaluation.   Clinical Impression  Pt is a pleasant 77 year old female who was admitted for hyponatremia. Pt performs bed mobility with mod I, transfers with cga, and ambulation with cga and RW. Pt demonstrates deficits with strength/mobility/endurance. Pt is slightly impulsive, trying to get out of bed upon arrival to use bathroom prior to PT being ready. Appears motivated to work with PT. Would benefit from skilled PT to address above deficits and promote optimal return to PLOF. Recommend transition to [facility] upon discharge from acute hospitalization.       Follow Up Recommendations Home health PT;Supervision for mobility/OOB    Equipment Recommendations  None recommended by PT    Recommendations for Other Services       Precautions / Restrictions Precautions Precautions: Fall Restrictions Weight Bearing Restrictions: No      Mobility  Bed Mobility Overal bed mobility: Modified Independent             General bed mobility comments: uses railing for transitioning to sitting at EOB. ONce seated able to sit with upright posture  Transfers Overall transfer level: Needs assistance Equipment used: Rolling walker (2 wheeled) Transfers: Sit to/from Stand Sit to Stand: Min guard         General transfer comment: first attempt with IV pole. 2nd attempt with RW with increased safe technique. UPright posture noted  Ambulation/Gait Ambulation/Gait assistance: Min guard Gait Distance (Feet): 240 Feet Assistive device: Rolling walker (2 wheeled) Gait Pattern/deviations: Step-through pattern     General Gait  Details: ambulated with slightly forward flexed posture. Cued for keeping RW close to body. Slight fatigue present with increased exertion.  Stairs            Wheelchair Mobility    Modified Rankin (Stroke Patients Only)       Balance Overall balance assessment: Needs assistance;History of Falls Sitting-balance support: Feet supported Sitting balance-Leahy Scale: Good     Standing balance support: Bilateral upper extremity supported Standing balance-Leahy Scale: Good                               Pertinent Vitals/Pain Pain Assessment: No/denies pain    Home Living Family/patient expects to be discharged to:: Private residence Living Arrangements: Spouse/significant other;Children(lives with boyfriend and then son during chemo visits) Available Help at Discharge: Family Type of Home: House Home Access: Stairs to enter Entrance Stairs-Rails: Left Entrance Stairs-Number of Steps: 2  Home Layout: One level Home Equipment: Environmental consultant - 2 wheels;Cane - single point      Prior Function Level of Independence: Independent with assistive device(s)         Comments: uses SPC for home environment and RW for community distances     Hand Dominance        Extremity/Trunk Assessment   Upper Extremity Assessment Upper Extremity Assessment: Overall WFL for tasks assessed    Lower Extremity Assessment Lower Extremity Assessment: Generalized weakness(B LE grossly 4/5 evident by functional mobility)       Communication   Communication: No difficulties  Cognition Arousal/Alertness: Awake/alert Behavior During Therapy: WFL for tasks assessed/performed Overall Cognitive Status: Within Functional  Limits for tasks assessed                                        General Comments      Exercises Other Exercises Other Exercises: Pt ambulated to bathroom with cga and IV pole. Needs supervision for hygiene and transfers. Cues for safety    Assessment/Plan    PT Assessment Patient needs continued PT services  PT Problem List Decreased strength;Decreased activity tolerance;Decreased balance;Decreased safety awareness       PT Treatment Interventions Gait training;Therapeutic exercise;Balance training    PT Goals (Current goals can be found in the Care Plan section)  Acute Rehab PT Goals Patient Stated Goal: to get stronger PT Goal Formulation: With patient Time For Goal Achievement: 02/16/18 Potential to Achieve Goals: Good    Frequency Min 2X/week   Barriers to discharge        Co-evaluation               AM-PAC PT "6 Clicks" Daily Activity  Outcome Measure Difficulty turning over in bed (including adjusting bedclothes, sheets and blankets)?: None Difficulty moving from lying on back to sitting on the side of the bed? : None Difficulty sitting down on and standing up from a chair with arms (e.g., wheelchair, bedside commode, etc,.)?: Unable Help needed moving to and from a bed to chair (including a wheelchair)?: A Little Help needed walking in hospital room?: A Little Help needed climbing 3-5 steps with a railing? : A Little 6 Click Score: 18    End of Session Equipment Utilized During Treatment: Gait belt Activity Tolerance: Patient tolerated treatment well Patient left: in chair;with chair alarm set;with family/visitor present Nurse Communication: Mobility status PT Visit Diagnosis: Muscle weakness (generalized) (M62.81);History of falling (Z91.81);Difficulty in walking, not elsewhere classified (R26.2)    Time: 8921-1941 PT Time Calculation (min) (ACUTE ONLY): 29 min   Charges:   PT Evaluation $PT Eval Low Complexity: 1 Low PT Treatments $Therapeutic Activity: 8-22 mins   PT G CodesGreggory Stallion, PT, DPT [phone]   Winona Sison 02/02/2018, 3:47 PM

## 2018-02-02 NOTE — Progress Notes (Signed)
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   [id number]:Brentuximab vedotin:   A cycle is every 3 weeks:     Brentuximab vedotin   **Always confirm dose/schedule in your pharmacy ordering system**  REASON: Disease Progression PRIOR TREATMENT: Off Pathway: Brentuximab vedotin TREATMENT RESPONSE: Progressive Disease (PD)  START OFF PATHWAY REGIMEN - Lymphoma and CLL   [id number]:Bendamustine + Rituximab (90/375) q28 Days:   A cycle is every 28 days:     Bendamustine      Rituximab   **Always confirm dose/schedule in your pharmacy ordering system**  Patient Characteristics: Disease Type: Not Applicable Disease Type: Not Applicable Disease Type: Not Applicable Intent of Therapy: Non-Curative / Palliative Intent, Discussed with Patient

## 2018-02-03 LAB — BASIC METABOLIC PANEL
ANION GAP: 6 (ref 5–15)
BUN: 16 mg/dL (ref 8–23)
CALCIUM: 8.7 mg/dL — AB (ref 8.9–10.3)
CHLORIDE: 99 mmol/L (ref 98–111)
CO2: 27 mmol/L (ref 22–32)
Creatinine, Ser: 0.86 mg/dL (ref 0.44–1.00)
GFR calc non Af Amer: >60 mL/min (ref >60)
GLUCOSE: 106 mg/dL — AB (ref 70–99)
POTASSIUM: 3.9 mmol/L (ref 3.5–5.1)
Sodium: 132 mmol/L — ABNORMAL LOW (ref 135–145)

## 2018-02-03 MED ORDER — DICLOFENAC SODIUM 1 % TD GEL: 2.0000 g | Freq: Three times a day (TID) | TRANSDERMAL | 1 refills | Status: DC | PRN

## 2018-02-03 MED ORDER — HEPARIN SOD (PORK) LOCK FLUSH 100 UNIT/ML IV SOLN
500.0000 [IU] | Freq: Once | INTRAVENOUS | Status: AC
  Administered 2018-02-03: 12:00:00 500 [IU] via INTRAVENOUS
  Filled 2018-02-03 (×2): qty 5

## 2018-02-03 MED ORDER — DICLOFENAC SODIUM 1 % TD GEL
2.0000 g | Freq: Three times a day (TID) | TRANSDERMAL | Status: DC | PRN
  Filled 2018-02-03: qty 100

## 2018-02-03 NOTE — Plan of Care (Signed)
VSS O/N, pt. Rested well, pt. Remains on RA. Pt. Able to ambulate well with front wheel walker. No care concerns at this time.

## 2018-02-03 NOTE — Progress Notes (Signed)
Pt is being discharged today, discharge instructions reviewed with her and her daughter, both verified understanding. 0 paper prescriptions were given to them. Port was deaccessed and all belongings were returned to her. She was rolled out in a wheelchair by staff.

## 2018-02-03 NOTE — Progress Notes (Signed)
Chaplain called nurse to let her know, she could not find Chaplain. Nurse said ok thank you. Engineering geologist, nurse said oh ok that is fine.

## 2018-02-03 NOTE — Discharge Summary (Signed)
[facility] [place] NAME: Toni Griffith    MR#:  [id number]  DATE OF BIRTH:  1941-03-04  DATE OF ADMISSION:  02/01/2018 ADMITTING PHYSICIAN: Toni Basta, MD  DATE OF DISCHARGE: 02/03/2018  PRIMARY CARE PHYSICIAN: Dionne Bucy., MD    ADMISSION DIAGNOSIS:  lymphoma hyopnatremia hyopkalemia  DISCHARGE DIAGNOSIS:  Electrolyte abnormality--corrected  SECONDARY DIAGNOSIS:   Past Medical History:  Diagnosis Date  . Arthritis   . Cancer [facility])    lymphoma-right hip  . Deaf   . Hypertension     HOSPITAL COURSE:   Toni a77 y.o.femalewith a known history of lymphoma, hypertension, deaf and understands sign language-had a recurrence of lymphoma after getting chemo and radiation in 2017. She is on chemotherapy for last 2 months. Recently started on closer [place]. She was found to have low sodium and potassium at the oncology clinical routine labs.  *Recurrent hyponatremia Stop chlorthalidone. Received IV fluids. Checked TSH, normal. Came in with sodium of 124--- IV fluids 131--132 -feels a whole lot better  *Hypokalemia Magnesium is normal, replace oral and IV. -Potassium 3.7  *Lymphoma She is currently following at cancer center and getting chemotherapy, advised to continue the same.  *Hyperlipidemia Continue atorvastatin.  overall improving.  HHPT arranged.D/c home   CONSULTS OBTAINED:    DRUG ALLERGIES:  No Known Allergies  DISCHARGE MEDICATIONS:   Allergies as of 02/03/2018   No Known Allergies     Medication List    STOP taking these medications   acyclovir cream 5 % Commonly known as:  ZOVIRAX   chlorthalidone 25 MG tablet Commonly known as:  HYGROTON   gabapentin 100 MG capsule Commonly known as:  NEURONTIN   potassium chloride SA 20 MEQ tablet Commonly known as:  K-DUR,KLOR-CON     TAKE these medications   acetaminophen 325 MG tablet Commonly known as:   TYLENOL Take 650 mg by mouth.   allopurinol 300 MG tablet Commonly known as:  ZYLOPRIM TAKE 1 TABLET BY MOUTH EVERY DAY   aspirin EC 81 MG tablet Take 81 mg by mouth daily.   atorvastatin 80 MG tablet Commonly known as:  LIPITOR Take 80 mg by mouth every evening.   diclofenac sodium 1 % Gel Commonly known as:  VOLTAREN Apply 2 g topically 3 (three) times daily as needed (left knee).   FLUoxetine 40 MG capsule Commonly known as:  PROZAC Take 40 mg by mouth daily.   HYDROcodone-acetaminophen 5-325 MG tablet Commonly known as:  NORCO Take 1 tablet by mouth every 6 (six) hours as needed for moderate pain.   lidocaine-prilocaine cream Commonly known as:  EMLA Apply cream 1 hour before chemotherapy treatment and place small peive of saran wrap over cream to protect clothing   ondansetron 4 MG disintegrating tablet Commonly known as:  ZOFRAN ODT Take 1 tablet (4 mg total) by mouth every 8 (eight) hours as needed for nausea or vomiting.   pantoprazole 40 MG tablet Commonly known as:  PROTONIX Take 40 mg by mouth daily.   vitamin B-12 500 MCG tablet Commonly known as:  CYANOCOBALAMIN Take 500 mcg by mouth daily.   Vitamin D3 2000 units capsule Take 2,000 Units by mouth daily.   vitamin E 400 UNIT capsule Take 400 Units by mouth daily.       If you experience worsening of your admission symptoms, develop shortness of breath, life threatening emergency, suicidal or homicidal thoughts you must seek medical attention immediately by calling  911 or calling your MD immediately  if symptoms less severe.  You Must read complete instructions/literature along with all the possible adverse reactions/side effects for all the Medicines you take and that have been prescribed to you. Take any new Medicines after you have completely understood and accept all the possible adverse reactions/side effects.   Please note  You were cared for by a hospitalist during your hospital stay. If you  have any questions about your discharge medications or the care you received while you were in the hospital after you are discharged, you can call the unit and asked to speak with the hospitalist on call if the hospitalist that took care of you is not available. Once you are discharged, your primary care physician will handle any further medical issues. Please note that NO REFILLS for any discharge medications will be authorized once you are discharged, as it is imperative that you return to your primary care physician (or establish a relationship with a primary care physician if you do not have one) for your aftercare needs so that they can reassess your need for medications and monitor your lab values. Today   SUBJECTIVE  Via Video interpreter Doing well  VITAL SIGNS:  Blood pressure (!) 158/95, pulse 87, temperature 99 F (37.2 C), temperature source Oral, resp. rate 17, height 5\' 2"  (1.575 m), weight 76.2 kg (167 lb 14.4 oz), SpO2 98 %.  I/O:    Intake/Output Summary (Last 24 hours) at 02/03/2018 0824 Last data filed at 02/02/2018 1901 Gross per 24 hour  Intake 360 ml  Output 0 ml  Net 360 ml    PHYSICAL EXAMINATION:  GENERAL:  77 y.o.-year-old patient lying in the bed with no acute distress.  EYES: Pupils equal, round, reactive to light and accommodation. No scleral icterus. Extraocular muscles intact.  HEENT: Head atraumatic, normocephalic. Oropharynx and nasopharynx clear.  NECK:  Supple, no jugular venous distention. No thyroid enlargement, no tenderness.  LUNGS: Normal breath sounds bilaterally, no wheezing, rales,rhonchi or crepitation. No use of accessory muscles of respiration.  CARDIOVASCULAR: S1, S2 normal. No murmurs, rubs, or gallops.  ABDOMEN: Soft, non-tender, non-distended. Bowel sounds present. No organomegaly or mass.  EXTREMITIES: No pedal edema, cyanosis, or clubbing.  NEUROLOGIC: Cranial nerves II through XII are intact. Muscle strength 5/5 in all extremities.  Sensation intact. Gait not checked.  PSYCHIATRIC: The patient is alert and oriented x 3.  SKIN: No obvious rash, lesion, or ulcer.   DATA REVIEW:   CBC  Recent Labs  Lab 02/02/18 0547  WBC 8.3  HGB 11.0*  HCT 32.2*  PLT 507*    Chemistries  Recent Labs  Lab 02/01/18 0913  02/03/18 0502  NA 124*   < > 132*  K 3.0*   < > 3.9  CL 88*   < > 99  CO2 22   < > 27  GLUCOSE 115*   < > 106*  BUN 14   < > 16  CREATININE 1.07*   < > 0.86  CALCIUM 8.8*   < > 8.7*  MG 2.0  --   --   AST 40  --   --   ALT 23  --   --   ALKPHOS 90  --   --   BILITOT 1.3*  --   --    < > = values in this interval not displayed.    Microbiology Results   No results found for this or any previous visit (from the past  240 hour(s)).  RADIOLOGY:  No results found.   Management plans discussed with the patient, family and they are in agreement.  CODE STATUS:     Code Status Orders  (From admission, onward)        Start     Ordered   02/01/18 1320  Full code  Continuous     02/01/18 1319    Code Status History    This patient has a current code status but no historical code status.      TOTAL TIME TAKING CARE OF THIS PATIENT: 40 minutes.    Fritzi Mandes M.D on 02/03/2018 at 8:24 AM  Between 7am to 6pm - Pager - [phone] After 6pm go to [email or web address] - password [email or web address] [facility]  Office  [phone]  CC: Primary care physician; Dionne Bucy., MD

## 2018-02-03 NOTE — Care Management Note (Signed)
Case Management Note  Patient Details  Name: Anasofia Micallef MRN: [id number] Date of Birth: 1940/09/10  Subjective/Objective:    Patient to be discharged per MD order. Orders in place for home health services. RNCM spoke with patient and daughter regarding agencies. Family tells me patient is currently open with [facility] but in [facility]. Spoke to [place] who agrees to resume services at patients new location in [place]. Patient is moving in with her son [name] [phone]. Walker in the home but no other DME is needed. Family will provide transport home today.          Ines Bloomer RN BSN RNCM [phone]   Action/Plan:   Expected Discharge Date:  02/03/18               Expected Discharge Plan:     In-House Referral:     Discharge planning Services     Post Acute Care Choice:  Resumption of Svcs/PTA Provider, Home Health Choice offered to:  Patient, Adult Children  DME Arranged:    DME Agency:     HH Arranged:  RN, PT HH Agency:  [facility]  Status of Service:  Completed, signed off  If discussed at [place] of Stay Meetings, dates discussed:    Additional Comments:  Salene Mohamud A Alcides Nutting, RN 02/03/2018, 11:08 AM

## 2018-02-03 NOTE — Progress Notes (Signed)
Chaplain received page from nurse stated PT would like to do AD. Nurse stated PT had documents already filled out, just needed Notary. Chaplain said ok, let her see if she can found notary. Nurse said ok.

## 2018-02-07 ENCOUNTER — Inpatient Hospital Stay: Payer: Medicare HMO

## 2018-02-08 ENCOUNTER — Telehealth: Payer: Self-pay | Admitting: *Deleted

## 2018-02-08 NOTE — Telephone Encounter (Signed)
Patient's daughter called to ask "what is the action plan" for her mother's treatment.

## 2018-02-08 NOTE — Telephone Encounter (Signed)
Spoke to patient's daughter and they are agreeable to go ahead with biopsy.      dhs

## 2018-02-08 NOTE — Telephone Encounter (Signed)
If they are amenable, we will proceed with biopsy of the hypermetabolic lingular lesion in her lung. She will be starting on a new chemo regimen, however we will have to wait until Mike Gip get back to plan that.   Would they like for me to set up the biopsy?

## 2018-02-12 ENCOUNTER — Telehealth: Payer: Self-pay | Admitting: *Deleted

## 2018-02-12 ENCOUNTER — Other Ambulatory Visit: Payer: Self-pay | Admitting: Urgent Care

## 2018-02-12 DIAGNOSIS — R911 Solitary pulmonary nodule: Secondary | ICD-10-CM

## 2018-02-12 NOTE — Telephone Encounter (Signed)
Called patient's daughter, [name], and LVM that she has been approved for her procedure.  Scheduled for Wednesday, July 24th.  Patient should arrive @ 9 am for 10 am procedure time.  Dr. Pascal Lux will be performing the procedure.  Interpreter has been requested.

## 2018-02-12 NOTE — Telephone Encounter (Signed)
Dr. Vernard Gambles (radiologist) called yesterday. These procedures have to be "approved" by IR prior to scheduling. He said that he would proceed with scheduling after I spoke with him and hold him what I am looking for. I just called over to scheduling and am waiting on a return call.   As it any biopsy there are complications. This one is no different. Being that we are dealing with the lung, there is risk for developing a PTX. IR will review the risks prior to the procedure, but to answer their question, there are risks.   Honor Loh, MSN, APRN, FNP-C, CEN Oncology/Hematology Nurse Practitioner  [facility] 02/12/18, 11:53 AM

## 2018-02-12 NOTE — Telephone Encounter (Signed)
Patient's daughter was notified about biopsy by Nada Boozer, RN

## 2018-02-12 NOTE — Telephone Encounter (Signed)
Patient's daughter called asking if the biopsy has been scheduled yet and she also wanted to know if it was a safe procedure. She thought she remembered someone talking about the risk of a collapsed lung? She has not heard anything about appointment. Please advise.    dhs

## 2018-02-14 ENCOUNTER — Telehealth: Payer: Self-pay | Admitting: *Deleted

## 2018-02-14 NOTE — Telephone Encounter (Signed)
Daughter called to find out how long it would be before her mother was scheduled for follow up after biopsy. Resulting usually takes two or three days appointment will be made after results are back. Daughter verbalized understanding.

## 2018-02-15 ENCOUNTER — Telehealth: Payer: Self-pay | Admitting: *Deleted

## 2018-02-15 NOTE — Telephone Encounter (Signed)
Spoke with patient's daughter regarding changing the time for patient's appointment for 7-24 to 7:30 am arrival time for 8:30 procedure time.  [name] states the time change will work out fine for them.  Central Scheduling notified of confirmation with patient.

## 2018-02-15 NOTE — Telephone Encounter (Signed)
Called patient's daughter, [name] and LVM that specials has called and asked that patient change her arrival time to 7:30 for 8:30 procedure (biopsy).

## 2018-02-16 ENCOUNTER — Other Ambulatory Visit: Payer: Self-pay | Admitting: Urgent Care

## 2018-02-16 DIAGNOSIS — E876 Hypokalemia: Secondary | ICD-10-CM

## 2018-02-16 DIAGNOSIS — C7951 Secondary malignant neoplasm of bone: Secondary | ICD-10-CM

## 2018-02-16 DIAGNOSIS — C8198 Hodgkin lymphoma, unspecified, lymph nodes of multiple sites: Secondary | ICD-10-CM

## 2018-02-16 DIAGNOSIS — C8580 Other specified types of non-Hodgkin lymphoma, unspecified site: Principal | ICD-10-CM

## 2018-02-16 DIAGNOSIS — C8330 Diffuse large B-cell lymphoma, unspecified site: Secondary | ICD-10-CM

## 2018-02-16 DIAGNOSIS — R911 Solitary pulmonary nodule: Secondary | ICD-10-CM

## 2018-02-19 ENCOUNTER — Other Ambulatory Visit: Payer: Self-pay | Admitting: Radiology

## 2018-02-19 ENCOUNTER — Other Ambulatory Visit: Payer: Self-pay | Admitting: Student

## 2018-02-20 ENCOUNTER — Ambulatory Visit
Admission: RE | Admit: 2018-02-20 | Discharge: 2018-02-20 | Disposition: A | Discharge status: Home / Self Care | Payer: Medicare HMO | Source: Ambulatory Visit | Attending: Interventional Radiology | Admitting: Interventional Radiology

## 2018-02-20 ENCOUNTER — Ambulatory Visit: Admission: RE | Admit: 2018-02-20 | Payer: Medicare HMO | Source: Ambulatory Visit

## 2018-02-20 ENCOUNTER — Ambulatory Visit
Admission: RE | Admit: 2018-02-20 | Discharge: 2018-02-20 | Disposition: A | Discharge status: Home / Self Care | Payer: Medicare HMO | Source: Ambulatory Visit | Attending: Urgent Care | Admitting: Urgent Care

## 2018-02-20 ENCOUNTER — Other Ambulatory Visit: Payer: Self-pay | Admitting: Oncology

## 2018-02-20 DIAGNOSIS — Z7982 Long term (current) use of aspirin: Secondary | ICD-10-CM | POA: Diagnosis not present

## 2018-02-20 DIAGNOSIS — Z9889 Other specified postprocedural states: Secondary | ICD-10-CM | POA: Diagnosis not present

## 2018-02-20 DIAGNOSIS — Z79899 Other long term (current) drug therapy: Secondary | ICD-10-CM | POA: Diagnosis not present

## 2018-02-20 DIAGNOSIS — I10 Essential (primary) hypertension: Secondary | ICD-10-CM | POA: Diagnosis not present

## 2018-02-20 DIAGNOSIS — Z79891 Long term (current) use of opiate analgesic: Secondary | ICD-10-CM | POA: Insufficient documentation

## 2018-02-20 DIAGNOSIS — C7802 Secondary malignant neoplasm of left lung: Secondary | ICD-10-CM | POA: Diagnosis not present

## 2018-02-20 DIAGNOSIS — M1990 Unspecified osteoarthritis, unspecified site: Secondary | ICD-10-CM | POA: Insufficient documentation

## 2018-02-20 DIAGNOSIS — Z8579 Personal history of other malignant neoplasms of lymphoid, hematopoietic and related tissues: Secondary | ICD-10-CM | POA: Diagnosis not present

## 2018-02-20 DIAGNOSIS — Z87891 Personal history of nicotine dependence: Secondary | ICD-10-CM | POA: Diagnosis not present

## 2018-02-20 DIAGNOSIS — Z801 Family history of malignant neoplasm of trachea, bronchus and lung: Secondary | ICD-10-CM | POA: Insufficient documentation

## 2018-02-20 DIAGNOSIS — R911 Solitary pulmonary nodule: Secondary | ICD-10-CM | POA: Insufficient documentation

## 2018-02-20 DIAGNOSIS — Z951 Presence of aortocoronary bypass graft: Secondary | ICD-10-CM | POA: Diagnosis not present

## 2018-02-20 DIAGNOSIS — Z806 Family history of leukemia: Secondary | ICD-10-CM | POA: Diagnosis not present

## 2018-02-20 DIAGNOSIS — H9190 Unspecified hearing loss, unspecified ear: Secondary | ICD-10-CM | POA: Insufficient documentation

## 2018-02-20 DIAGNOSIS — Z833 Family history of diabetes mellitus: Secondary | ICD-10-CM | POA: Diagnosis not present

## 2018-02-20 DIAGNOSIS — J95811 Postprocedural pneumothorax: Secondary | ICD-10-CM | POA: Diagnosis not present

## 2018-02-20 LAB — BASIC METABOLIC PANEL
ANION GAP: 6 (ref 5–15)
BUN: 10 mg/dL (ref 8–23)
CO2: 27 mmol/L (ref 22–32)
Calcium: 8.7 mg/dL — ABNORMAL LOW (ref 8.9–10.3)
Chloride: 105 mmol/L (ref 98–111)
Creatinine, Ser: 0.96 mg/dL (ref 0.44–1.00)
GFR calc Af Amer: >60 mL/min (ref >60)
GFR, EST NON AFRICAN AMERICAN: 57 mL/min — AB (ref >60)
GLUCOSE: 109 mg/dL — AB (ref 70–99)
POTASSIUM: 3.9 mmol/L (ref 3.5–5.1)
SODIUM: 138 mmol/L (ref 135–145)

## 2018-02-20 LAB — CBC
HCT: 31.6 % — ABNORMAL LOW (ref 35.0–47.0)
Hemoglobin: 10.7 g/dL — ABNORMAL LOW (ref 12.0–16.0)
MCH: 30.3 pg (ref 26.0–34.0)
MCHC: 33.7 g/dL (ref 32.0–36.0)
MCV: 90 fL (ref 80.0–100.0)
PLATELETS: 482 10*3/uL — AB (ref 150–440)
RBC: 3.52 MIL/uL — ABNORMAL LOW (ref 3.80–5.20)
RDW: 21.5 % — AB (ref 11.5–14.5)
WBC: 7.1 10*3/uL (ref 3.6–11.0)

## 2018-02-20 LAB — PROTIME-INR
INR: 1.08
PROTHROMBIN TIME: 13.9 s (ref 11.4–15.2)

## 2018-02-20 MED ORDER — MIDAZOLAM HCL 5 MG/5ML IJ SOLN
INTRAMUSCULAR | Status: AC
  Filled 2018-02-20: qty 5

## 2018-02-20 MED ORDER — LIDOCAINE HCL (PF) 1 % IJ SOLN
INTRAMUSCULAR | Status: AC | PRN
  Administered 2018-02-20: 4 mL

## 2018-02-20 MED ORDER — FENTANYL CITRATE (PF) 100 MCG/2ML IJ SOLN
INTRAMUSCULAR | Status: AC | PRN
  Administered 2018-02-20: 25 ug via INTRAVENOUS
  Administered 2018-02-20: 50 ug via INTRAVENOUS

## 2018-02-20 MED ORDER — SODIUM CHLORIDE 0.9 % IV SOLN
INTRAVENOUS | Status: DC
  Administered 2018-02-20: 1000 mL via INTRAVENOUS

## 2018-02-20 MED ORDER — FENTANYL CITRATE (PF) 100 MCG/2ML IJ SOLN
INTRAMUSCULAR | Status: AC
  Filled 2018-02-20: qty 4

## 2018-02-20 MED ORDER — HYDROCODONE-ACETAMINOPHEN 5-325 MG PO TABS
ORAL_TABLET | ORAL | Status: AC
  Administered 2018-02-20: 1 via ORAL
  Filled 2018-02-20: qty 1

## 2018-02-20 MED ORDER — MIDAZOLAM HCL 5 MG/5ML IJ SOLN
INTRAMUSCULAR | Status: AC | PRN
  Administered 2018-02-20 (×2): 0.5 mg via INTRAVENOUS

## 2018-02-20 MED ORDER — FENTANYL CITRATE (PF) 100 MCG/2ML IJ SOLN
INTRAMUSCULAR | Status: AC | PRN
  Administered 2018-02-20: 50 ug via INTRAVENOUS

## 2018-02-20 MED ORDER — HYDROCODONE-ACETAMINOPHEN 5-325 MG PO TABS
1.0000 | ORAL_TABLET | Freq: Once | ORAL | Status: AC
  Administered 2018-02-20: 1 via ORAL

## 2018-02-20 NOTE — Progress Notes (Signed)
Patient c/o 9:10 pain at procedure site prior to transfer from CT table to stretcher.  Dr. Barbie Banner notified.  Repeat CT scan-"no change"-er Dr. Barbie Banner.  Fentanyl 50 mcg IV given.  Patient transferred to [place].

## 2018-02-20 NOTE — Procedures (Signed)
LUL lung nodule biopsy 18 g times three EBL 0 Comp - Tiny PTX

## 2018-02-20 NOTE — H&P (Addendum)
Chief Complaint: Patient was seen in consultation today for No chief complaint on file.  at the request of Terrell,Grace E.  Referring Physician(s): Terrell,Grace E.  Supervising Physician: Marybelle Killings  Patient Status: [facility] - Out-pt  History of Present Illness: Toni Griffith is a 77 y.o. 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 She also had a recent history of hyponatremia. She is deaf. Interpreter was present. No recent history of angina.  Past Medical History:  Diagnosis Date  . Arthritis   . Cancer [facility])    lymphoma-right hip  . Deaf   . Hypertension     Past Surgical History:  Procedure Laterality Date  . CARDIAC SURGERY  1993  . CORONARY ARTERY BYPASS GRAFT    . CYST REMOVAL HAND    . PERIPHERAL VASCULAR CATHETERIZATION N/A 04/06/2016   Procedure: Glori Luis Cath Insertion;  Surgeon: Algernon Huxley, MD;  Location: [facility];  Service: Cardiovascular;  Laterality: N/A;    Allergies: Patient has no known allergies.  Medications: Prior to Admission medications   Medication Sig Start Date End Date Taking? Authorizing Provider  acetaminophen (TYLENOL) 325 MG tablet Take 650 mg by mouth.   Yes [provider]  allopurinol (ZYLOPRIM) 300 MG tablet TAKE 1 TABLET BY MOUTH EVERY DAY 02/16/18  Yes Karen Kitchens, NP  aspirin EC 81 MG tablet Take 81 mg by mouth daily.   Yes [provider]  Cholecalciferol (VITAMIN D3) 2000 units capsule Take 2,000 Units by mouth daily.   Yes [provider]  diclofenac sodium (VOLTAREN) 1 % GEL Apply 2 g topically 3 (three) times daily as needed (left knee). 02/03/18  Yes Fritzi Mandes, MD  FLUoxetine (PROZAC) 40 MG capsule Take 40 mg by mouth daily.  04/20/17  Yes [provider]  pantoprazole (PROTONIX) 40 MG tablet Take 40 mg by mouth daily.  02/04/16  Yes [provider]  vitamin B-12 (CYANOCOBALAMIN) 500 MCG tablet Take 500 mcg by mouth daily.   Yes  [provider]  vitamin E 400 UNIT capsule Take 400 Units by mouth daily.    Yes [provider]  atorvastatin (LIPITOR) 80 MG tablet Take 80 mg by mouth every evening.  10/14/15 02/01/18  [provider]  HYDROcodone-acetaminophen (NORCO) 5-325 MG tablet Take 1 tablet by mouth every 6 (six) hours as needed for moderate pain. 12/21/17   Karen Kitchens, NP  lidocaine-prilocaine (EMLA) cream Apply cream 1 hour before chemotherapy treatment and place small peive of saran wrap over cream to protect clothing 04/06/16   Lequita Asal, MD  ondansetron (ZOFRAN ODT) 4 MG disintegrating tablet Take 1 tablet (4 mg total) by mouth every 8 (eight) hours as needed for nausea or vomiting. 01/28/18   Lequita Asal, MD     Family History  Problem Relation Age of Onset  . Lung cancer Mother   . Lung cancer Father   . Diabetes Maternal Uncle   . Myasthenia gravis Maternal Grandmother   . Leukemia Maternal Grandfather     Social History   Socioeconomic History  . Marital status: Single    Spouse name: Not on file  . Number of children: Not on file  . Years of education: Not on file  . Highest education level: Not on file  Occupational History  . Not on file  Social Needs  . Financial resource strain: Not on file  . Food insecurity:    Worry: Not  on file    Inability: Not on file  . Transportation needs:    Medical: Not on file    Non-medical: Not on file  Tobacco Use  . Smoking status: Former Smoker    Last attempt to quit: 11/21/2015    Years since quitting: 2.2  . Smokeless tobacco: Never Used  Substance and Sexual Activity  . Alcohol use: Yes    Comment: rare  . Drug use: No  . Sexual activity: Never  Lifestyle  . Physical activity:    Days per week: Not on file    Minutes per session: Not on file  . Stress: Not on file  Relationships  . Social connections:    Talks on phone: Not on file    Gets together: Not on file    Attends religious service: Not  on file    Active member of club or organization: Not on file    Attends meetings of clubs or organizations: Not on file    Relationship status: Not on file  Other Topics Concern  . Not on file  Social History Narrative  . Not on file     Review of Systems: A 12 point ROS discussed and pertinent positives are indicated in the HPI above.  All other systems are negative.  Review of Systems  Vital Signs: BP (!) 141/71   Pulse 85   Temp 99.8 F (37.7 C) (Oral)   Resp (!) 27   Ht 5\' 2"  (1.575 m)   Wt 141 lb (64 kg)   SpO2 92%   BMI 25.79 kg/m   Physical Exam  Constitutional: She is oriented to person, place, and time. She appears well-developed and well-nourished.  HENT:  Head: Normocephalic and atraumatic.  Cardiovascular: Normal rate and regular rhythm.  Systolic murmur  Pulmonary/Chest: Effort normal and breath sounds normal.  Neurological: She is alert and oriented to person, place, and time.    Imaging: Nm Pet Image Restag (ps) Skull Base To Thigh  Result Date: 01/29/2018 CLINICAL DATA:  Subsequent treatment strategy for B-cell lymphoma. EXAM: NUCLEAR MEDICINE PET SKULL BASE TO THIGH TECHNIQUE: 8.95 mCi F-18 FDG was injected intravenously. Full-ring PET imaging was performed from the skull base to thigh after the radiotracer. CT data was obtained and used for attenuation correction and anatomic localization. Fasting blood glucose: 129 mg/dl COMPARISON:  09/18/2017 FINDINGS: Mediastinal blood pool activity: SUV max 2.7 NECK: Asymmetric increased uptake localizing to the right submandibular gland is again noted. On the corresponding CT images there may be a small lymph node measuring 7 mm. SUV max is equal to 11.17. Previously 10.48. Incidental CT findings: none CHEST: The index right upper lobe pulmonary nodule measures 6 mm on today's study and has an SUV max equal to 0.78. Previously this measured 8 mm within SUV max of 1.46. This is compatible with Deauville criteria 2.  Target lesion within the lingula measures 2.3 cm and has an SUV max of 9.48. Previously this measured 0.8 cm and had an SUV max of 3.96. There is a new nodule within the lingula which is also hypermetabolic measuring 1.5 cm within SUV max of 9.07. Incidental CT findings: Aortic atherosclerosis noted. Calcification in the LAD coronary artery noted. ABDOMEN/PELVIS: No abnormal uptake within the liver, pancreas, or spleen. Left common iliac lymph node measures 1 cm and has an SUV max of 4.84. Previously this measured 1.8 cm within SUV max of 17.01. Incidental CT findings: Bilateral adrenal gland adenomas noted. Similar appearance of large left  lobe of liver cyst. Aortic atherosclerotic disease noted. SKELETON: Focal area of increased uptake adjacent to the L3 vertebra corresponds to an intramuscular soft tissue nodule measuring 1.8 cm within SUV max of 22.27. Previously this measured 1.7 cm within SUV max of 15.7. Incidental CT findings: none IMPRESSION: 1. Persistent and progressive areas of hypermetabolism within the right submandibular gland, lingula of the left lung, left common iliac nodal chain, and right lumbar paraspinal soft tissues. Findings are compatible with Deauville criteria 4 and 5. 2. A new nodule is identified within the lingula which demonstrates intense uptake compatible with Deauville criteria 5. 3. Aortic atherosclerosis and LAD coronary artery atherosclerotic calcifications. 4. Bilateral adrenal gland adenomas and a moderate size hiatal hernia are again noted. Electronically Signed   By: Kerby Moors M.D.   On: 01/29/2018 14:52    Labs:  CBC: Recent Labs    01/01/18 1500 01/11/18 0819 02/01/18 0913 02/02/18 0547  WBC 6.3 4.9 7.6 8.3  HGB 11.8* 11.0* 11.4* 11.0*  HCT 34.5* 32.4* 32.5* 32.2*  PLT 423 372 541* 507*    COAGS: Recent Labs    10/16/17 1306  INR 0.99  APTT 35    BMP: Recent Labs    01/11/18 0819 02/01/18 0913 02/02/18 0547 02/03/18 0502  NA 130* 124*  131* 132*  K 3.1* 3.0* 3.7 3.9  CL 97* 88* 95* 99  CO2 22 22 26 27   GLUCOSE 134* 115* 109* 106*  BUN 19 14 13 16   CALCIUM 8.9 8.8* 8.6* 8.7*  CREATININE 1.08* 1.07* 0.83 0.86  GFRNONAA 49* 50* >60 >60  GFRAA 57* 58* >60 >60    LIVER FUNCTION TESTS: Recent Labs    12/21/17 0854 01/01/18 1500 01/11/18 0819 02/01/18 0913  BILITOT 1.0 1.7* 1.1 1.3*  AST 25 49* 30 40  ALT 14 40 20 23  ALKPHOS 73 73 80 90  PROT 6.7 7.5 6.7 7.2  ALBUMIN 3.4* 3.9 3.5 3.4*    TUMOR MARKERS: No results for input(s): AFPTM, CEA, CA199, CHROMGRNA in the last 8760 hours.  Assessment and Plan:  New LUL lung nodule. CT biopsy to follow.  Thank you for this interesting consult.  I greatly enjoyed meeting Toni Griffith and look forward to participating in their care.  A copy of this report was sent to the requesting provider on this date.  Electronically Signed: Davarion Cuffee, ART A, MD 02/20/2018, 9:02 AM   I spent a total of  40 Minutes   in face to face in clinical consultation, greater than 50% of which was counseling/coordinating care for lung biopsy.

## 2018-02-21 LAB — SURGICAL PATHOLOGY

## 2018-02-22 ENCOUNTER — Telehealth: Payer: Self-pay | Admitting: *Deleted

## 2018-02-22 ENCOUNTER — Inpatient Hospital Stay (HOSPITAL_BASED_OUTPATIENT_CLINIC_OR_DEPARTMENT_OTHER): Payer: Medicare HMO | Admitting: Oncology

## 2018-02-22 ENCOUNTER — Ambulatory Visit
Admission: RE | Admit: 2018-02-22 | Discharge: 2018-02-22 | Disposition: A | Discharge status: Home / Self Care | Payer: Medicare HMO | Source: Ambulatory Visit | Attending: Hematology and Oncology | Admitting: Hematology and Oncology

## 2018-02-22 DIAGNOSIS — R0602 Shortness of breath: Secondary | ICD-10-CM | POA: Diagnosis present

## 2018-02-22 DIAGNOSIS — H9190 Unspecified hearing loss, unspecified ear: Secondary | ICD-10-CM | POA: Diagnosis not present

## 2018-02-22 DIAGNOSIS — R0789 Other chest pain: Secondary | ICD-10-CM | POA: Diagnosis present

## 2018-02-22 DIAGNOSIS — N644 Mastodynia: Secondary | ICD-10-CM | POA: Diagnosis not present

## 2018-02-22 DIAGNOSIS — C3432 Malignant neoplasm of lower lobe, left bronchus or lung: Secondary | ICD-10-CM | POA: Diagnosis not present

## 2018-02-22 DIAGNOSIS — R200 Anesthesia of skin: Secondary | ICD-10-CM | POA: Diagnosis not present

## 2018-02-22 DIAGNOSIS — Z5189 Encounter for other specified aftercare: Secondary | ICD-10-CM

## 2018-02-22 DIAGNOSIS — C8518 Unspecified B-cell lymphoma, lymph nodes of multiple sites: Secondary | ICD-10-CM | POA: Diagnosis present

## 2018-02-22 DIAGNOSIS — Z923 Personal history of irradiation: Secondary | ICD-10-CM

## 2018-02-22 DIAGNOSIS — Z9221 Personal history of antineoplastic chemotherapy: Secondary | ICD-10-CM

## 2018-02-22 DIAGNOSIS — E876 Hypokalemia: Secondary | ICD-10-CM | POA: Diagnosis not present

## 2018-02-22 DIAGNOSIS — Z7982 Long term (current) use of aspirin: Secondary | ICD-10-CM | POA: Diagnosis not present

## 2018-02-22 DIAGNOSIS — Z79899 Other long term (current) drug therapy: Secondary | ICD-10-CM

## 2018-02-22 DIAGNOSIS — Z87891 Personal history of nicotine dependence: Secondary | ICD-10-CM | POA: Diagnosis not present

## 2018-02-22 DIAGNOSIS — E871 Hypo-osmolality and hyponatremia: Secondary | ICD-10-CM | POA: Diagnosis not present

## 2018-02-22 MED ORDER — TRAMADOL HCL 50 MG PO TABS: 50.0000 mg | ORAL_TABLET | Freq: Four times a day (QID) | ORAL | 0 refills | Status: DC | PRN

## 2018-02-22 NOTE — Telephone Encounter (Signed)
Daughter called to report that patient had lung biopsy earlier this week and is now complaining of pain under the left axilla area. Asking if this is normal or does she need to be seen. Please advise

## 2018-02-22 NOTE — Telephone Encounter (Signed)
Always concern for post-procedure pain. As long as she has no shortness of breath, or signs of procedure site infection, I would be fine with her medicating at home.   If they would like to be seen for peace of mind, that would be acceptable as well.  Toni Griffith

## 2018-02-22 NOTE — Telephone Encounter (Signed)
Per Dr Mike Gip, there is concern for pneumothorax and the patient does complain of shortness of breath with the pain. CXR ordered and appointment for [facility] made for today

## 2018-02-22 NOTE — Progress Notes (Signed)
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 on 02/01/2017 where she admitted to feeling good. Should continue to lose some weight was down 9 pounds.  They discussed her recent PET scan which showed progressive disease is unclear if this was progressive lymphoma or new lung cancer.  A biopsy was discussed for confirmation.  They discussed consideration of been the bendamustine + Rituxan for recurrent gray zone lymphoma. Also discussed hospital admission secondary to hyponatremia and hypokalemia.  She appeared dehydrated and is currently on a thiazide diuretic.  She was admitted to the hospital from 02/01/2018 through 02/03/2018 for electrolyte abnormalities. While inpatient they stopped her PO  diuretics and she received IV fluids.  Sodium improved from 124-132.  Received IV potassium.  Potassium improved to 3.7 at discharge.    Left upper lung nodule biopsy on 02/20/2018 by Dr. Barbie Banner.  No complications.  Oncology History   Patient has history of stage IVB B-cell lymphoma, unclassifiable, with features intermediate between diffuse large B-cell lymphoma and classical Hodgkin's lymphoma ("gray zone" lymphoma).  She underwent right iliac wing bone/bone marrow biopsy on 03/08/2016.  Head MRI on 02/21/2016 revealed no evidence of malignancy. Thyroid ultrasound on 02/21/2016 revealed a 2.4 cm solid nodule in the inferior aspect of the left lobe  PET scan on 03/03/2016 revealed a hypermetabolic left lower lobe  mass, bilateral pleural and pulmonary parenchymal nodular metastasis, a large pleural lesion invading the anterior left chest wall.   There was activity in the superior left ocular orbit.  There was a large lesion centered in the right iliac wing with soft tissue extension.  There were hypermetabolic right external iliac lymph nodes  She was admitted to [facility] in [facility].  She had renal failure and hypercalcemia. She was treated with IVF, calcitonin and Zometa.The patient has a history of coronary artery disease s/p CABG in 1993.  She has no history of heart failure.  She is deaf and requires an interpreter.  Echo on 04/04/2016 revealed an EF of 55-60%.  Hepatitis B and C testing was negative on 03/30/2016.   G6PD assay was normal.  She declined LP with MTX prophylaxis.  She received 6 cycles of mini-RCHOP (04/07/2016 - 07/21/2016).  She has had some transient fingertip numbness.  She received radiationto the right iliac wing, from 10/11/2016 - 11/01/2016. She received 3000 cGy over 3 weeks.  PET scan on 01/29/2017 revealed interval progression of hypermetabolic nodules in the right upper (13 mm compared to 9 mm; SUV 10) and left lower lobes (26 mm compared to 22 mm; SUV 7.5).  There was new hypermetabolic focus of activity along the anterior left pleura although no underlying pleural or lung mass was evident.  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  She underwent SBRT 03/26/2017 - 04/09/2017.  She received 5000 cGy in 5 fractions to the left lower lobe lesion.  She underwent SBRT to a RUL lesion from 05/23/2017 - 06/18/2017.  PET scan on 09/18/2017 revealed a mixed appearance, with  marked improvement in the prior 2 pulmonary nodules; but with new Deauville 5 left common iliac lymph node enlargement; new Deauville 5 right paraspinal lesion; and new Deauville 4 left infrahilar nodal activity.  There was a focus of accentuated  metabolic activity which had been present along the right lower submandibular gland but currently seemed to project over the subcutaneous tissues without CT correlate. Current maximum SUV 10.5, previously 9.7. There was increased accentuated activity along the left lateral spurring at L2-3. While typically such activity is inflammatory due to spurring, this had increased significantly since the prior exam and merit surveillance. There was a new 8 by 5 mm right upper lobe pulmonary nodule, Deauville 2.  CT guided right paraspinal lesion biopsy on 10/16/2017 was compatible with involvement by the patient's previously diagnosed lymphoma.  The case was referred to [facility] hematopathology.  The neoplastic cells were largely CD30 negative.  Repeat CD30 immunohistochemistry faintly stained > 10% of the large atypical cells. CD20 stained the scattered large atypical cells and few small cells.   She initiated brentuximab vedotin/Adcetris on 11/23/17.   She has a normocytic anemia.  Labs on 04/17/2016 revealed an elevated ferritin (444), iron saturation (11%), low TIBC (240), B12 (2409), and folate (10.8).  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  B12 and folate were normal on 12/06/2017.      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       02/10/2018 -  Chemotherapy    The patient had palonosetron (ALOXI) injection 0.25 mg, 0.25 mg, Intravenous,  Once, 0 of 4 cycles riTUXimab (RITUXAN) 700 mg in sodium chloride 0.9 % 250 mL (2.1875 mg/mL) infusion, 375 mg/m2 = 700 mg, Intravenous,  Once, 0 of 4 cycles bendamustine (BENDEKA) 125 mg in sodium chloride 0.9  % 50 mL (2.2727 mg/mL) chemo infusion, 70 mg/m2 = 125 mg (100 % of original dose 70 mg/m2), Intravenous,  Once, 0 of 4 cycles Dose modification: 70 mg/m2 (original dose 70 mg/m2, Cycle 1, Reason: Patient Age, Comment: Advance as tolerated)  for chemotherapy treatment.        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       02/10/2018 -  Chemotherapy    The patient had palonosetron (ALOXI) injection 0.25 mg, 0.25 mg, Intravenous,  Once, 0 of 4 cycles riTUXimab (RITUXAN) 700 mg in sodium chloride 0.9 % 250 mL (2.1875 mg/mL) infusion, 375 mg/m2 = 700 mg, Intravenous,  Once, 0 of 4 cycles bendamustine (BENDEKA) 125 mg in sodium chloride 0.9 % 50 mL (2.2727 mg/mL) chemo infusion, 70 mg/m2 = 125  mg (100 % of original dose 70 mg/m2), Intravenous,  Once, 0 of 4 cycles Dose modification: 70 mg/m2 (original dose 70 mg/m2, Cycle 1, Reason: Patient Age, Comment: Advance as tolerated)  for chemotherapy treatment.         Interval history-  Patient presents for left lower breast pain. States pain presented after biopsy on 02/20/2018. Pain is constant and rated 5 out of 10. Pain is worse when taking deep breaths. Has taken Tylenol with some relief. Denies injury. Admits to recent biopsy. Had Chest X-ray following without evidence of pneumothorax.   ECOG FS:2 - Symptomatic, <50% confined to bed  Review of systems- Review of Systems  Constitutional: Negative.  Negative for chills, fever, malaise/fatigue  and weight loss.  HENT: Negative for congestion and ear pain.   Eyes: Negative.  Negative for blurred vision and double vision.  Respiratory: Negative.  Negative for cough, sputum production and shortness of breath.   Cardiovascular: Negative.  Negative for chest pain, palpitations and leg swelling.  Gastrointestinal: Negative.  Negative for abdominal pain, constipation, diarrhea, nausea and vomiting.  Genitourinary: Negative for dysuria, frequency and urgency.  Musculoskeletal: Positive for myalgias. Negative for back pain and falls.  Skin: Negative for itching and rash.       Biopsy site: Clean dry and intact. No signs of infection  Small fluid filled area under breast at 5 o'clock. Painful to touch.   Neurological: Positive for weakness. Negative for headaches.  Endo/Heme/Allergies: Negative.  Does not bruise/bleed easily.  Psychiatric/Behavioral: Negative.  Negative for depression. The patient is not nervous/anxious and does not have insomnia.      Current treatment- s/p 3 cycles of Adectris (Last given on 01/11/18). Will be starting Rituxin and Bendamustine D1, 2 q 28d  No Known Allergies   Past Medical History:  Diagnosis Date  . Arthritis   . 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 Deaf   . Hypertension      Past Surgical History:  Procedure Laterality Date  . CARDIAC SURGERY  1993  . CORONARY ARTERY BYPASS GRAFT    . CYST REMOVAL HAND    . PERIPHERAL VASCULAR CATHETERIZATION N/A 04/06/2016   Procedure: Glori Luis Cath Insertion;  Surgeon: Algernon Huxley, MD;  Location: [place] [facility];  Service: Cardiovascular;  Laterality: N/A;    Social History   Socioeconomic History  . Marital status: Single    Spouse name: Not on file  . Number of children: Not on file  . Years of education: Not on file  . Highest education level: Not on file  Occupational History  . Not on file  Social Needs  . Financial resource strain: Not on file  . Food insecurity:    Worry: Not on file     Inability: Not on file  . Transportation needs:    Medical: Not on file    Non-medical: Not on file  Tobacco Use  . Smoking status: Former Smoker    Last attempt to quit: 11/21/2015    Years since quitting: 2.2  . Smokeless tobacco: Never Used  Substance and Sexual Activity  . Alcohol use: Yes    Comment: rare  . Drug use: No  . Sexual activity: Never  Lifestyle  . Physical activity:    Days per week: Not on file    Minutes per session: Not on file  . Stress: Not on file  Relationships  . Social connections:    Talks on phone: Not on file  Gets together: Not on file    Attends religious service: Not on file    Active member of club or organization: Not on file    Attends meetings of clubs or organizations: Not on file    Relationship status: Not on file  . Intimate partner violence:    Fear of current or ex partner: Not on file    Emotionally abused: Not on file    Physically abused: Not on file    Forced sexual activity: Not on file  Other Topics Concern  . Not on file  Social History Narrative  . Not on file    Family History  Problem Relation Age of Onset  . Lung cancer Mother   . Lung cancer Father   . Diabetes Maternal Uncle   . Myasthenia gravis Maternal Grandmother   . Leukemia Maternal Grandfather      Current Outpatient Medications:  .  acetaminophen (TYLENOL) 325 MG tablet, Take 650 mg by mouth., Disp: , Rfl:  .  allopurinol (ZYLOPRIM) 300 MG tablet, TAKE 1 TABLET BY MOUTH EVERY DAY, Disp: 90 tablet, Rfl: 1 .  aspirin EC 81 MG tablet, Take 81 mg by mouth daily., Disp: , Rfl:  .  atorvastatin (LIPITOR) 80 MG tablet, Take 80 mg by mouth every evening. , Disp: , Rfl:  .  Cholecalciferol (VITAMIN D3) 2000 units capsule, Take 2,000 Units by mouth daily., Disp: , Rfl:  .  diclofenac sodium (VOLTAREN) 1 % GEL, Apply 2 g topically 3 (three) times daily as needed (left knee)., Disp: 1 Tube, Rfl: 1 .  FLUoxetine (PROZAC) 40 MG capsule, Take 40 mg by mouth  daily. , Disp: , Rfl:  .  HYDROcodone-acetaminophen (NORCO) 5-325 MG tablet, Take 1 tablet by mouth every 6 (six) hours as needed for moderate pain., Disp: 30 tablet, Rfl: 0 .  lidocaine-prilocaine (EMLA) cream, Apply cream 1 hour before chemotherapy treatment and place small peive of saran wrap over cream to protect clothing, Disp: 30 g, Rfl: 1 .  ondansetron (ZOFRAN ODT) 4 MG disintegrating tablet, Take 1 tablet (4 mg total) by mouth every 8 (eight) hours as needed for nausea or vomiting., Disp: 20 tablet, Rfl: 0 .  pantoprazole (PROTONIX) 40 MG tablet, Take 40 mg by mouth daily. , Disp: , Rfl:  .  traMADol (ULTRAM) 50 MG tablet, Take 1 tablet (50 mg total) by mouth every 6 (six) hours as needed for moderate pain., Disp: 30 tablet, Rfl: 0 .  vitamin B-12 (CYANOCOBALAMIN) 500 MCG tablet, Take 500 mcg by mouth daily., Disp: , Rfl:  .  vitamin E 400 UNIT capsule, Take 400 Units by mouth daily. , Disp: , Rfl:  No current facility-administered medications for this visit.   Facility-Administered Medications Ordered in Other Visits:  .  heparin lock flush 100 unit/mL, 500 Units, Intravenous, Once, Corcoran, Melissa C, MD .  sodium chloride flush (NS) 0.9 % injection 10 mL, 10 mL, Intravenous, PRN, Mike Gip, Melissa C, MD, 10 mL at 04/05/17 1034  Physical exam: There were no vitals filed for this visit. Physical Exam  Constitutional: She is oriented to person, place, and time. Vital signs are normal. She appears well-developed and well-nourished.  HENT:  Head: Normocephalic and atraumatic.  Eyes: Pupils are equal, round, and reactive to light.  Neck: Normal range of motion.  Cardiovascular: Normal rate, regular rhythm and normal heart sounds.  No murmur heard. Pulmonary/Chest: Effort normal and breath sounds normal. She has no wheezes. Left breast exhibits mass and tenderness.  Abdominal: Soft. Normal appearance and bowel sounds are normal. She exhibits no distension. There is no tenderness.    Musculoskeletal: Normal range of motion. She exhibits no edema.  Neurological: She is alert and oriented to person, place, and time.  Skin: Skin is warm and dry. No rash noted.  Psychiatric: Judgment normal.     CMP Latest Ref Rng & Units 02/20/2018  Glucose 70 - 99 mg/dL 109(H)  BUN 8 - 23 mg/dL 10  Creatinine 0.44 - 1.00 mg/dL 0.96  Sodium 135 - 145 mmol/L 138  Potassium 3.5 - 5.1 mmol/L 3.9  Chloride 98 - 111 mmol/L 105  CO2 22 - 32 mmol/L 27  Calcium 8.9 - 10.3 mg/dL 8.7(L)  Total Protein 6.5 - 8.1 g/dL -  Total Bilirubin 0.3 - 1.2 mg/dL -  Alkaline Phos 38 - 126 U/L -  AST 15 - 41 U/L -  ALT 0 - 44 U/L -   CBC Latest Ref Rng & Units 02/20/2018  WBC 3.6 - 11.0 K/uL 7.1  Hemoglobin 12.0 - 16.0 g/dL 10.7(L)  Hematocrit 35.0 - 47.0 % 31.6(L)  Platelets 150 - 440 K/uL 482(H)    No images are attached to the encounter.  Dg Chest 2 View  Result Date: 02/22/2018 CLINICAL DATA:  Lung biopsy shortness of breath and pain These results will be called to the ordering clinician or representative by the Radiology Department at the imaging location. EXAM: CHEST - 2 VIEW COMPARISON:  Two days ago FINDINGS: Normal heart size. Moderate hiatal hernia. Status post CABG. Normal porta catheter positioning on the right. Streaky opacities bilaterally consistent with atelectasis or scarring. Absence on radiograph 1 year prior favors atelectasis, appearance stable from CT scanogram 2 days ago. This obscures a recently biopsied nodule on the left. No edema, effusion, or pneumothorax. Severe thoracolumbar spine degeneration. IMPRESSION: 1. Resolved left apical pneumothorax. 2. Bilateral atelectatic type opacities. Electronically Signed   By: Monte Fantasia M.D.   On: 02/22/2018 14:47   Ct Biopsy  Result Date: 02/20/2018 INDICATION: Left upper lobe lung mass EXAM: CT BIOPSY MEDICATIONS: None. ANESTHESIA/SEDATION: Fentanyl 75 mcg IV; Versed 1 mg IV Moderate Sedation Time:  13 minutes The patient was  continuously monitored during the procedure by the interventional radiology nurse under my direct supervision. FLUOROSCOPY TIME:  Fluoroscopy Time:  minutes  seconds ( mGy). COMPLICATIONS: None immediate. PROCEDURE: Informed written consent was obtained from the patient after a thorough discussion of the procedural risks, benefits and alternatives. All questions were addressed. Maximal Sterile Barrier Technique was utilized including caps, mask, sterile gowns, sterile gloves, sterile drape, hand hygiene and skin antiseptic. A timeout was performed prior to the initiation of the procedure. Under CT guidance, a(n) 17 gauge guide needle was advanced into the new left upper lobe lung nodule. Subsequently 3 18 gauge core biopsies were obtained. The guide needle was removed. Post biopsy images demonstrate a tiny left pneumothorax. Patient tolerated the procedure well without complication. Vital sign monitoring by nursing staff during the procedure will continue as patient is in the special procedures unit for post procedure observation. FINDINGS: The images document guide needle placement within the left upper lobe new lung nodule. Post biopsy images demonstrate a tiny left pneumothorax. IMPRESSION: Successful core biopsy of a new left upper lobe lung nodule. Electronically Signed   By: Marybelle Killings M.D.   On: 02/20/2018 11:44   Dg Chest Port 1 View  Result Date: 02/20/2018 CLINICAL DATA:  Status post lung biopsy EXAM: PORTABLE CHEST 1  VIEW COMPARISON:  Comparison is made to a chest x-ray from 02/19/2017 and the CT imaging from earlier today. FINDINGS: There is a tiny left apical pneumothorax which is not significantly changed compared to the CT from earlier today. The heart remains enlarged. Postop changes are noted. Stable right jugular Port-A-Cath. Opacity in the lateral left mid lung corresponds to the nodules described on the prior PET-CT. IMPRESSION: Tiny left apical pneumothorax is present which is unchanged  from the post biopsy CT imaging from earlier today. Electronically Signed   By: Marybelle Killings M.D.   On: 02/20/2018 14:03     Assessment and plan- Patient is a 77 y.o. female who presents for left breast pain following recent lung biopsy.   1.  Stage IV gray zone lymphoma: s/p 3 cycles of Adcetris. Completed last on 01/11/18.  Recent PET scan showed progressive disease with new mass.  Unclear if lung mass is lymphoma or second primary.  Had biopsy completed by Dr. Barbie Banner on 02/20/18 confirmation.  Plan is to initiate Rituxan + bendamustine day 1, 2 q. 28 days. RTC on 03/04/18 for biopsy f/u and assessment.   2.  Left lower breast pain/worse with breathing in: Repeat chest x-ray: She had resolved left apical pneumothorax and bilateral atelectatic type opacities.  Her vitals are stable.  She is not tachycardic. She takes an 81 mg aspirin daily. Unlikely a PE. Reviewed chest x-ray with [name] and we are in agreement that this fluid-filled mass that is palpated is potentially inflammation/fluid surrounding the cancer itself.  This is not related to the biopsy. Biopsy site is free from infection.  We will treat symptomatically.  She is been taking Tylenol with some relief. Offered tramadol 50 mg every 6 hours as needed for moderate pain.  Prescription sent to pharmacy.   Visit Diagnosis 1. Breast pain, left     Patient expressed understanding and was in agreement with this plan. She also understands that She can call clinic at any time with any questions, concerns, or complaints.   Greater than 50% was spent in counseling and coordination of care with this patient including but not limited to discussion of the relevant topics above (See A&P) including, but not limited to diagnosis and management of acute and chronic medical conditions.    Faythe Casa, AGNP-C [facility]- [phone] Pager- [phone] 03/01/2018 3:00 PM

## 2018-02-27 LAB — PATHOLOGY

## 2018-03-03 ENCOUNTER — Other Ambulatory Visit: Payer: Self-pay | Admitting: Hematology and Oncology

## 2018-03-03 DIAGNOSIS — T451X5A Adverse effect of antineoplastic and immunosuppressive drugs, initial encounter: Secondary | ICD-10-CM

## 2018-03-03 DIAGNOSIS — C8518 Unspecified B-cell lymphoma, lymph nodes of multiple sites: Secondary | ICD-10-CM

## 2018-03-03 DIAGNOSIS — E871 Hypo-osmolality and hyponatremia: Secondary | ICD-10-CM

## 2018-03-03 DIAGNOSIS — D6481 Anemia due to antineoplastic chemotherapy: Secondary | ICD-10-CM

## 2018-03-03 DIAGNOSIS — D649 Anemia, unspecified: Secondary | ICD-10-CM

## 2018-03-03 NOTE — Progress Notes (Signed)
[facility] day:  03/04/18  Chief Complaint: Toni Griffith is a 77 y.o. female with stage IVB gray zone lymphoma who is seen for review of interval lung biopsy and discussion regarding direction of therapy.  HPI:  The patient was last seen in the medical oncology clinic on 02/01/2018.  At that time, she was feeling better. She had lost 9 pounds.  Sodium was 124.  Potassium was 3.0. PET scan revealed progressive disease. She was felt to have progressive lymphoma (right paraspinal area previously biopsied and pathology + lymphoma).  She was felt to possibly also have lung cancer given the PET + lesion in the lingula of the left lung (LLL nodule previously biopsy + for squamous cell lung cancer).  We discussed possible biopsy(ies) for confirmation.  She was admitted to [facility] from 02/01/2018 - 02/03/2018 with hyponatremia.  Chlorthalidone was discontinued.  She received IVF.  Discharge sodium was 132.  She underwent CT guided LUL nodule biopsy on 02/20/2018.  Pathology revealed squamous cell lung cancer.  She was seen by Rulon Abide, NP on 02/22/2018 for biopsy site pain.    Symptomatically, patient is doing well today. She denies any acute complaints. Patient has increased sodium intake. She is using Pedialyte, liquid IV, V8 juice. Her sodium increased to 138 on 02/24/2018. Patient doing well following her biopsy. Her previously reported pain to the biopsy site has essentially resolved. Patient notes that she has some pain when she lays down at times. Pain in her hips has resolved. She has intermittent issues with pain in her LEFT knee.   Patient's energy is "ok". She is living with her son. She is able to complete all of her activities of daily living independently.  Patient denies that she has experienced any B symptoms. She denies any interval infections. Patient advises that she maintains an adequate appetite. She is eating well. Weight today is 168 lb 3 oz  (76.3 kg), which compared to her last visit to the clinic, represents a 1 pound increase.   Patient complains of generalized pain rated 4/10 in the clinic today.   Past Medical History:  Diagnosis Date  . Arthritis   . Cancer [facility])    lymphoma-right hip  . Deaf   . Hypertension     Past Surgical History:  Procedure Laterality Date  . CARDIAC SURGERY  1993  . CORONARY ARTERY BYPASS GRAFT    . CYST REMOVAL HAND    . PERIPHERAL VASCULAR CATHETERIZATION N/A 04/06/2016   Procedure: Glori Luis Cath Insertion;  Surgeon: Algernon Huxley, MD;  Location: [facility];  Service: Cardiovascular;  Laterality: N/A;    Family History  Problem Relation Age of Onset  . Lung cancer Mother   . Lung cancer Father   . Diabetes Maternal Uncle   . Myasthenia gravis Maternal Grandmother   . Leukemia Maternal Grandfather     Social History:  reports that she quit smoking about 2 years ago. She has never used smokeless tobacco. She reports that she drinks alcohol. She reports that she does not use drugs.  She smoked 1 1/2 packs per day for 20 years (30 pack year smoking history) until 10/2015.  She is hearing impaired (deaf).  She lives in [place] with her boyfriend.  Her daughter lives in [place].  Her daughter, [name]'s contact number is [phone].  She is living with her son.  The patient is accompanied her daughter, son, and the interpreter.  Allergies:  No Known Allergies  Current Medications: Current Outpatient Medications  Medication Sig Dispense Refill  . acetaminophen (TYLENOL) 325 MG tablet Take 650 mg by mouth.    Marland Kitchen allopurinol (ZYLOPRIM) 300 MG tablet TAKE 1 TABLET BY MOUTH EVERY DAY 90 tablet 1  . aspirin EC 81 MG tablet Take 81 mg by mouth daily.    Marland Kitchen atorvastatin (LIPITOR) 80 MG tablet Take 80 mg by mouth every evening.     Marland Kitchen b complex vitamins tablet Take 1 tablet by mouth daily.    . Cholecalciferol (VITAMIN D3) 2000 units capsule Take 2,000 Units by mouth daily.    . diclofenac  sodium (VOLTAREN) 1 % GEL Apply 2 g topically 3 (three) times daily as needed (left knee). 1 Tube 1  . FLUoxetine (PROZAC) 40 MG capsule Take 40 mg by mouth daily.     Marland Kitchen HYDROcodone-acetaminophen (NORCO) 5-325 MG tablet Take 1 tablet by mouth every 6 (six) hours as needed for moderate pain. 30 tablet 0  . lidocaine-prilocaine (EMLA) cream Apply cream 1 hour before chemotherapy treatment and place small peive of saran wrap over cream to protect clothing 30 g 1  . ondansetron (ZOFRAN ODT) 4 MG disintegrating tablet Take 1 tablet (4 mg total) by mouth every 8 (eight) hours as needed for nausea or vomiting. 20 tablet 0  . pantoprazole (PROTONIX) 40 MG tablet Take 40 mg by mouth daily.     . traMADol (ULTRAM) 50 MG tablet Take 1 tablet (50 mg total) by mouth every 6 (six) hours as needed for moderate pain. 30 tablet 0  . vitamin B-12 (CYANOCOBALAMIN) 500 MCG tablet Take 500 mcg by mouth daily.    . vitamin E 400 UNIT capsule Take 400 Units by mouth daily.      No current facility-administered medications for this visit.    Facility-Administered Medications Ordered in Other Visits  Medication Dose Route Frequency Provider Last Rate Last Dose  . heparin lock flush 100 unit/mL  500 Units Intravenous Once Corcoran, Melissa C, MD      . sodium chloride flush (NS) 0.9 % injection 10 mL  10 mL Intravenous PRN Lequita Asal, MD   10 mL at 04/05/17 1034    Review of Systems  Constitutional: Positive for malaise/fatigue. Negative for chills, fever and weight loss (up 1 pound).       "I am feeling good".  Energy level is "ok".  HENT: Positive for hearing loss (Deaf). Negative for congestion, ear discharge, ear pain, nosebleeds, sinus pain and sore throat.   Eyes: Negative.  Negative for double vision, pain and discharge.  Respiratory: Negative.  Negative for cough, hemoptysis, sputum production and shortness of breath.   Cardiovascular: Negative.  Negative for chest pain, palpitations, orthopnea, leg  swelling and PND.  Gastrointestinal: Negative.  Negative for abdominal pain, blood in stool, constipation, diarrhea (resolved), melena, nausea and vomiting.  Genitourinary: Negative.  Negative for dysuria, frequency, hematuria and urgency.  Musculoskeletal: Positive for joint pain (hips; left knee). Negative for falls and myalgias.  Skin: Positive for rash (improved). Negative for itching.  Neurological: Negative for dizziness, focal weakness and weakness.       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  Does not bruise/bleed easily.  Psychiatric/Behavioral: Positive for depression. Negative for memory loss and suicidal ideas. The patient is not nervous/anxious and does not have insomnia.   All other systems reviewed and are negative.  Performance status (ECOG): 2 - Symptomatic, <50% confined  to bed  Vital Signs BP 134/82 (BP Location: Right Arm, Patient Position: Sitting)   Pulse (!) 103   Temp 98.2 F (36.8 C)   Resp 18   Wt 168 lb 3 oz (76.3 kg)   SpO2 94%   BMI 30.76 kg/m   Physical Exam  Constitutional: She is oriented to person, place, and time and well-developed, well-nourished, and in no distress.  HENT:  Head: Normocephalic and atraumatic.  Wearing a tan scarf.  Eyes: Pupils are equal, round, and reactive to light. EOM are normal. No scleral icterus.  Hazel eyes.  Neck: Normal range of motion. Neck supple. No tracheal deviation present. No thyromegaly present.  Cardiovascular: Normal rate, regular rhythm and normal heart sounds. Exam reveals no gallop and no friction rub.  No murmur heard. Pulmonary/Chest: Effort normal and breath sounds normal. No respiratory distress. She has no wheezes. She has no rales.  Abdominal: Soft. Bowel sounds are normal. She exhibits no distension. There is no tenderness.  Musculoskeletal: Normal range of motion. She exhibits no edema or tenderness.  Lymphadenopathy:    She has no cervical adenopathy.     She has no axillary adenopathy.       Right: No inguinal and no supraclavicular adenopathy present.       Left: No inguinal and no supraclavicular adenopathy present.  Neurological: She is alert and oriented to person, place, and time.  Skin: Skin is warm and dry. No rash noted. No erythema.  Psychiatric: Mood, affect and judgment normal.  Nursing note and vitals reviewed.   Radiology studies: 02/20/2016:  Chest, abdomen and pelvic CT revealed a 3.7 cm subpleural mass within the left lower lobe, a 1.1 cm right lower lobe subpleural nodule , 9 mm RUL nodule, and 0.4 cm lingular nodule.  Between the left second, third, and fourth ribs there was 1.9 cm soft tissue mass. There was a 2.5 cm low-attenuation nodule in the left lobe of the thyroid.   There were prominent left axillary nodes (up to 1.0 cm).   There was no definite mediastinal adenopathy. There was a 5.5 x 4.6 cm cystic lesion in the left hepatic lobe.  There was a 3.1 cm nodule in the right adrenal gland. There was a 1.2 cm right retrocaval lymph node with additional prominent subcentimeter retroperitoneal nodes. There was 1.0 cm left common iliac node. There was a 6.2 x 5.2 cm soft tissue mass within the right ilium.  02/21/2016:  Head MRI revealed no evidence of malignancy. Thyroid ultrasound on 02/21/2016 revealed a 2.4 cm solid nodule in the inferior aspect of the left lobe 03/03/2016:  PET scan revealed a hypermetabolic left lower lobe mass, bilateral pleural and pulmonary parenchymal nodular metastasis, a large pleural lesion invading the anterior left chest wall.   There was activity in the superior left ocular orbit.  There was a large lesion centered in the right iliac wing with soft tissue extension.  There were hypermetabolic right external iliac lymph nodes 06/06/2016:  PET scan revealed significant interval response to therapy.  There was significant decrease in size and FDG uptake associated with left lower lobe lung lesion.  There  was resolution of left-sided pleural base metastasis and left axillary and anterior mediastinal nodal metastasis.  There was resolution of previous hypermetabolic soft tissue metastasis/nodal metastasis posterior to the IVC.  There was marked improvement in hypermetabolic metastasis involving the right iliac wing.  There was persistent right upper lobe lung nodule which exhibits intense radiotracer uptake which may  represent a focus of metastatic disease or synchronous primary lung neoplasm. 06/01/2017:  PET scan revealed no new or progressive findings.  The 9 mm RUL pulmonary nodule remained hypermetabolic (SUV 25.0, previously 7.3) and no change in size.  The index lesion in the posterior left lower lobe was minimally smaller on CT imaging with very low level FDG uptake (SUV 1.6).  There was no change in the appearance at site of previous disease at the right iliac wing (SUV 1.9 compared to 2.6 previously). 01/29/2017:  PET scan revealed interval progression of hypermetabolic nodules in the right upper (13 mm compared to 9 mm; SUV 10) and left lower lobes (26 mm compared to 22 mm; SUV 7.5).  There was new hypermetabolic focus of activity along the anterior left pleura although no underlying pleural or lung mass was evident. 09/18/2017:  PET scan revealed a mixed appearance, with marked improvement in the prior 2 pulmonary nodules; but with new Deauville 5 left common iliac lymph node enlargement; new Deauville 5 right paraspinal lesion; and new Deauville 4 left infrahilar nodal activity.  There was a focus of accentuated metabolic activity which had been present along the right lower submandibular gland but currently seemed to project over the subcutaneous tissues without CT correlate. Current maximum SUV 10.5, previously 9.7.  There was increased accentuated activity along the left lateral spurring at L2-3. While typically such activity is inflammatory due to spurring, this had increased significantly since  the prior exam and merit surveillance. There was a new 8 by 5 mm right upper lobe pulmonary nodule, Deauville 2, which also merits surveillance. 01/29/2018:  PET scan revealed persistent and progressive areas of hypermetabolism within the right submandibular gland, lingula of the left lung, left common iliac nodal chain, and right lumbar paraspinal soft tissues. Findings were compatible with Deauville criteria 4 and 5.  There was a new nodule is identified within the lingula which demonstrated intense uptake compatible with Deauville criteria 5.    Assessment:  Rilya Longo is a 77 y.o. female with stage IVB B-cell lymphoma, unclassifiable, with features intermediate between diffuse large B-cell lymphoma and classical Hodgkin's lymphoma ("gray zone" lymphoma).  She underwent right iliac wing bone/bone marrow biopsy on 03/08/2016.  Head MRI on 02/21/2016 revealed no evidence of malignancy. 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   There was activity in the superior left ocular orbit.  There was a large lesion centered in the right iliac wing with soft tissue extension.  There were hypermetabolic right external iliac lymph nodes  She was admitted to [facility] in [facility].  She had renal failure and hypercalcemia. She was treated with IVF, calcitonin and Zometa.The patient has a history of coronary artery disease s/p CABG in 1993.  She has no history of heart failure.  She is deaf and requires an interpreter.  Echo on 04/04/2016 revealed an EF of 55-60%.  Hepatitis B and C testing was negative on 03/30/2016.   G6PD assay was normal.  She declined LP with MTX prophylaxis.  She received 6 cycles of mini-RCHOP (04/07/2016 - 07/21/2016).  She  has had some transient fingertip numbness.  She received radiation to the right iliac wing,  from 10/11/2016 - 11/01/2016.  She received 3000 cGy over 3 weeks.  PET scan on 01/29/2017 revealed interval progression of hypermetabolic nodules in the right upper (13 mm compared to 9 mm; SUV 10) and left lower lobes (26 mm compared to 22 mm; SUV 7.5).  There was new hypermetabolic focus of activity along the anterior left pleura although no underlying pleural or lung mass was evident.  CT guided left lower lobe nodule biopsy on 02/19/2017 confirmed squamous cell carcinoma.  She underwent SBRT 03/26/2017 - 04/09/2017.  She received 5000 cGy in 5 fractions to the left lower lobe lesion.  She underwent SBRT to a RUL lesion from 05/23/2017 - 06/18/2017.  CT guided left upper lobe nodule biopsy on 02/20/2018 revealed squamous cell lung cancer.  PET scan on 09/18/2017 revealed a mixed appearance, with marked improvement in the prior 2 pulmonary nodules; but with new Deauville 5 left common iliac lymph node enlargement; new Deauville 5 right paraspinal lesion; and new Deauville 4 left infrahilar nodal activity.  There was a focus of accentuated metabolic activity which had been present along the right lower submandibular gland but currently seemed to project over the subcutaneous tissues without CT correlate. Current maximum SUV 10.5, previously 9.7. There was increased accentuated activity along the left lateral spurring at L2-3. While typically such activity is inflammatory due to spurring, this had increased significantly since the prior exam and merit surveillance. There was a new 8 by 5 mm right upper lobe pulmonary nodule, Deauville 2.  CT guided right paraspinal lesion biopsy on 10/16/2017 was compatible with involvement by the patient's previously diagnosed lymphoma.  The case was referred to [facility] hematopathology.  The neoplastic cells were largely CD30 negative.  Repeat CD30 immunohistochemistry faintly  stained > 10% of the large atypical cells. CD20 stained the scattered large atypical cells and few small cells.   She received 3 cycles of brentuximab vedotin (11/23/2017 - 01/11/2018).  PET scan on 01/29/2018 revealed persistent and progressive areas of hypermetabolism within the right submandibular gland, lingula of the left lung, left common iliac nodal chain, and right lumbar paraspinal soft tissues. Findings were compatible with Deauville criteria 4 and 5.  There was a new nodule is identified within the lingula which demonstrated intense uptake compatible with Deauville criteria 5.   She has a normocytic anemia.  Labs on 04/17/2016 revealed an elevated ferritin (444), iron saturation (11%), low TIBC (240), B12 (2409), and folate (10.8).  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  B12 and folate were normal on 12/06/2017.  She was diagnosed with norovirus on 12/06/2017.  She presented with diarrhea.  Diarrhea has resolved.  She was admitted to [facility] from 01/20/2018 - 01/23/2018.  She presented with nausea, vomiting, diarrhea and generalized weakness. Work-up showed hypokalemia, hypomagnesemia, dehydration and UTI.  She was treated with IV fluids and empiric IV antibiotics. Potassium and magnesium were repleted. Urine culture showed E. coli.  She was discharged on Augmentin. She was switched from HCTZ to chorthalidone.  She was admitted to [facility] from 02/01/2018 - 02/03/2018 with hyponatremia.  Symptomatically, she is doing well. She denies any acute concerns. Patient complains of pain in her LEFT knee. Exam is grossly unremarkable.   Plan: 1. Squamous cell lung cancer:  Discuss results from LUL lung biopsy- squamous cell lung cancer.  She has 2 nodules biopsy positive for lung cancer (LUL and LLL) in the same lobe (T4 disease).  Discuss sending tumor for mutational analysis (Foundation One and PDL-1).  Discuss stereotactic  radiation. Refer back to radiation oncology for further evaluation and possible treatment.  2. Pearline Cables zone lymphoma:  Discuss progressive disease documented by enlarging right paraspinal soft tissue mass (biopsy proven) and adenopathy (left common iliac chain).  Discuss systemic therapy using bendamustine + Rituxan +/- polatuzumab vedotin. Side effects reviewed.  Discuss plan for treatment after completion of radiation. Patient provided with printed information today on polatuzumab vedotin for review at home. She has printed information on the bendamustine and Rituxan already. Patient encouraged to make a list of questions and/or concerns for further discussion prior to beginning therapy using this medication.   Continue allopurinol as previously prescribed for TLS prophylaxis.  3. Normocytic anemia:  Work-up:  CBC with diff, Coombs, retic, B12, folate, ferritin, iron studies. 4. Electrolyte abnormalities (hyponatemia):   Assess: BMP, uric acid. 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    Continue to monitor. 6. RTC based on drug approval - will need to call patient with appointment.    A total of (40+) minutes of face-to-face time was spent with the patient with greater than 50% of that time in counseling and care-coordination.    Honor Loh, NP  03/04/18, 4:21 PM   I saw and evaluated the patient, participating in the key portions of the service and reviewing pertinent diagnostic studies and records.  I reviewed the nurse practitioner's note and agree with the findings and the plan.  The assessment and plan were discussed with the patient.  Multiple questions were asked by the patient and answered.   Nolon Stalls, MD 03/04/18, 4:21 PM

## 2018-03-04 ENCOUNTER — Encounter: Payer: Self-pay | Admitting: Hematology and Oncology

## 2018-03-04 ENCOUNTER — Inpatient Hospital Stay: Payer: Medicare HMO | Attending: Hematology and Oncology | Admitting: Hematology and Oncology

## 2018-03-04 VITALS — BP 134/82 | HR 103 | Temp 98.2°F | Resp 18 | Wt 168.2 lb

## 2018-03-04 DIAGNOSIS — Z7982 Long term (current) use of aspirin: Secondary | ICD-10-CM | POA: Insufficient documentation

## 2018-03-04 DIAGNOSIS — E871 Hypo-osmolality and hyponatremia: Secondary | ICD-10-CM

## 2018-03-04 DIAGNOSIS — Z7189 Other specified counseling: Secondary | ICD-10-CM

## 2018-03-04 DIAGNOSIS — C3412 Malignant neoplasm of upper lobe, left bronchus or lung: Secondary | ICD-10-CM | POA: Diagnosis not present

## 2018-03-04 DIAGNOSIS — C3492 Malignant neoplasm of unspecified part of left bronchus or lung: Secondary | ICD-10-CM

## 2018-03-04 DIAGNOSIS — T451X5A Adverse effect of antineoplastic and immunosuppressive drugs, initial encounter: Secondary | ICD-10-CM

## 2018-03-04 DIAGNOSIS — Z87891 Personal history of nicotine dependence: Secondary | ICD-10-CM

## 2018-03-04 DIAGNOSIS — G620 Drug-induced polyneuropathy: Secondary | ICD-10-CM | POA: Diagnosis not present

## 2018-03-04 DIAGNOSIS — C8330 Diffuse large B-cell lymphoma, unspecified site: Secondary | ICD-10-CM | POA: Diagnosis present

## 2018-03-04 DIAGNOSIS — C8518 Unspecified B-cell lymphoma, lymph nodes of multiple sites: Secondary | ICD-10-CM

## 2018-03-04 DIAGNOSIS — D649 Anemia, unspecified: Secondary | ICD-10-CM | POA: Diagnosis not present

## 2018-03-04 DIAGNOSIS — Z79899 Other long term (current) drug therapy: Secondary | ICD-10-CM

## 2018-03-04 NOTE — Progress Notes (Signed)
Patient here today for scan results.

## 2018-03-13 ENCOUNTER — Other Ambulatory Visit: Payer: Self-pay

## 2018-03-13 ENCOUNTER — Ambulatory Visit
Admission: RE | Admit: 2018-03-13 | Discharge: 2018-03-13 | Disposition: A | Discharge status: Home / Self Care | Payer: Medicare HMO | Source: Ambulatory Visit | Attending: Radiation Oncology | Admitting: Radiation Oncology

## 2018-03-13 ENCOUNTER — Encounter: Payer: Self-pay | Admitting: Radiation Oncology

## 2018-03-13 VITALS — BP 143/83 | HR 87 | Temp 97.0°F | Resp 18 | Wt 169.4 lb

## 2018-03-13 DIAGNOSIS — C3432 Malignant neoplasm of lower lobe, left bronchus or lung: Secondary | ICD-10-CM | POA: Insufficient documentation

## 2018-03-13 DIAGNOSIS — Z87891 Personal history of nicotine dependence: Secondary | ICD-10-CM | POA: Insufficient documentation

## 2018-03-13 DIAGNOSIS — Z923 Personal history of irradiation: Secondary | ICD-10-CM | POA: Diagnosis not present

## 2018-03-13 DIAGNOSIS — C3492 Malignant neoplasm of unspecified part of left bronchus or lung: Secondary | ICD-10-CM

## 2018-03-13 NOTE — Progress Notes (Signed)
Radiation Oncology Follow up Note old patient new area  Name: Toni Griffith   Date:   03/13/2018 MRN:  [id number] DOB: 1940/12/31    This 77 y.o. female presents to the clinic today for reevaluation of new lesion the left lung lingula biopsy-proven squamous cell carcinoma.  REFERRING PROVIDER: Dionne Bucy., MD  HPI: patient is a 78 year old deaf female who is received multiple radiation treatments in the past. She has.b-cell lymphoma of his has been treated to her pelvis for palliation. She's also received radiation therapy to her bilateral lungs left lung received SB RTfor squamous cell carcinoma biopsy-proven she also had a right lung lesion also treated with SB RT. In the region of her previousleft lingular lesion is a new progressive hypermetabolic lesion which underwent CT-guided biopsy and is again a squamous cell carcinoma. She is asymptomatic specifically denies cough hemoptysis chest tightness or any change in pulmonary status. I been asked to evaluate her for possible further treatment.she is had very little side effects or complaints over her previous radiation. She achieved excellent palliation from her pelvic radiation.  COMPLICATIONS OF TREATMENT: none  FOLLOW UP COMPLIANCE: keeps appointments   PHYSICAL EXAM:  BP (!) 143/83 (BP Location: Right Arm, Patient Position: Sitting)   Pulse 87   Temp (!) 97 F (36.1 C) (Tympanic)   Resp 18   Wt 169 lb 6.8 oz (76.9 kg)   BMI 30.99 kg/m  Well-developed deaf female in NAD.Well-developed well-nourished patient in NAD. HEENT reveals PERLA, EOMI, discs not visualized.  Oral cavity is clear. No oral mucosal lesions are identified. Neck is clear without evidence of cervical or supraclavicular adenopathy. Lungs are clear to A&P. Cardiac examination is essentially unremarkable with regular rate and rhythm without murmur rub or thrill. Abdomen is benign with no organomegaly or masses noted. Motor sensory and DTR levels are equal and  symmetric in the upper and lower extremities. Cranial nerves II through XII are grossly intact. Proprioception is intact. No peripheral adenopathy or edema is identified. No motor or sensory levels are noted. Crude visual fields are within normal range.  RADIOLOGY RESULTS: serial PET/CT scans reviewed  PLAN: this time like to go ahead with radiation therapy to her left lingula biopsy-proven squamous cell carcinoma. Would like to do SB RT again 6000 cGy in 5 fractions. I know will be abutting or crossing over previous fields although in the literature this is quite common to do without sedating side effect or morbidity. Risks and benefits of treatment including possible productive cough fatigue skin reaction and increased incidence of lung necrosis secondary previous irradiated field all were discussed with the patient's family and patient. She seems to comprehend my treatment plan well. I have personally scheduled CT simulation with 4-dimensional study as well as PET CT fusion study for treatment planning.  I would like to take this opportunity to thank you for allowing me to participate in the care of your patient.Noreene Filbert, MD

## 2018-03-13 NOTE — Addendum Note (Signed)
Encounter addended by: Noreene Filbert, MD on: 03/13/2018 12:25 PM  Actions taken: LOS modified

## 2018-03-19 ENCOUNTER — Ambulatory Visit
Admission: RE | Admit: 2018-03-19 | Discharge: 2018-03-19 | Disposition: A | Discharge status: Home / Self Care | Payer: Medicare HMO | Source: Ambulatory Visit | Attending: Radiation Oncology | Admitting: Radiation Oncology

## 2018-03-19 DIAGNOSIS — Z510 Encounter for antineoplastic radiation therapy: Secondary | ICD-10-CM | POA: Insufficient documentation

## 2018-03-19 DIAGNOSIS — C3412 Malignant neoplasm of upper lobe, left bronchus or lung: Secondary | ICD-10-CM | POA: Diagnosis not present

## 2018-03-21 ENCOUNTER — Encounter: Payer: Self-pay | Admitting: Hematology and Oncology

## 2018-03-21 ENCOUNTER — Ambulatory Visit: Payer: Medicare HMO | Admitting: Radiation Oncology

## 2018-03-21 DIAGNOSIS — Z510 Encounter for antineoplastic radiation therapy: Secondary | ICD-10-CM | POA: Diagnosis not present

## 2018-03-28 ENCOUNTER — Other Ambulatory Visit: Payer: Self-pay | Admitting: *Deleted

## 2018-03-28 DIAGNOSIS — C3492 Malignant neoplasm of unspecified part of left bronchus or lung: Secondary | ICD-10-CM

## 2018-04-02 ENCOUNTER — Ambulatory Visit
Admission: RE | Admit: 2018-04-02 | Discharge: 2018-04-02 | Disposition: A | Discharge status: Home / Self Care | Payer: Medicare HMO | Source: Ambulatory Visit | Attending: Radiation Oncology | Admitting: Radiation Oncology

## 2018-04-02 DIAGNOSIS — C3412 Malignant neoplasm of upper lobe, left bronchus or lung: Secondary | ICD-10-CM | POA: Insufficient documentation

## 2018-04-02 DIAGNOSIS — Z510 Encounter for antineoplastic radiation therapy: Secondary | ICD-10-CM | POA: Insufficient documentation

## 2018-04-03 ENCOUNTER — Ambulatory Visit
Admission: RE | Admit: 2018-04-03 | Discharge: 2018-04-03 | Disposition: A | Discharge status: Home / Self Care | Payer: Medicare HMO | Source: Ambulatory Visit | Attending: Radiation Oncology | Admitting: Radiation Oncology

## 2018-04-03 DIAGNOSIS — Z510 Encounter for antineoplastic radiation therapy: Secondary | ICD-10-CM | POA: Diagnosis not present

## 2018-04-04 ENCOUNTER — Ambulatory Visit: Payer: Medicare HMO

## 2018-04-04 ENCOUNTER — Ambulatory Visit
Admission: RE | Admit: 2018-04-04 | Discharge: 2018-04-04 | Disposition: A | Discharge status: Home / Self Care | Payer: Medicare HMO | Source: Ambulatory Visit | Attending: Radiation Oncology | Admitting: Radiation Oncology

## 2018-04-04 DIAGNOSIS — Z510 Encounter for antineoplastic radiation therapy: Secondary | ICD-10-CM | POA: Diagnosis not present

## 2018-04-05 ENCOUNTER — Ambulatory Visit
Admission: RE | Admit: 2018-04-05 | Discharge: 2018-04-05 | Disposition: A | Discharge status: Home / Self Care | Payer: Medicare HMO | Source: Ambulatory Visit | Attending: Radiation Oncology | Admitting: Radiation Oncology

## 2018-04-05 DIAGNOSIS — Z510 Encounter for antineoplastic radiation therapy: Secondary | ICD-10-CM | POA: Diagnosis not present

## 2018-04-08 ENCOUNTER — Ambulatory Visit
Admission: RE | Admit: 2018-04-08 | Discharge: 2018-04-08 | Disposition: A | Discharge status: Home / Self Care | Payer: Medicare HMO | Source: Ambulatory Visit | Attending: Radiation Oncology | Admitting: Radiation Oncology

## 2018-04-08 DIAGNOSIS — Z510 Encounter for antineoplastic radiation therapy: Secondary | ICD-10-CM | POA: Diagnosis not present

## 2018-04-09 ENCOUNTER — Ambulatory Visit
Admission: RE | Admit: 2018-04-09 | Discharge: 2018-04-09 | Disposition: A | Discharge status: Home / Self Care | Payer: Medicare HMO | Source: Ambulatory Visit | Attending: Radiation Oncology | Admitting: Radiation Oncology

## 2018-04-09 ENCOUNTER — Inpatient Hospital Stay: Payer: Medicare HMO | Attending: Hematology and Oncology

## 2018-04-09 ENCOUNTER — Ambulatory Visit: Payer: Medicare HMO

## 2018-04-09 DIAGNOSIS — Z510 Encounter for antineoplastic radiation therapy: Secondary | ICD-10-CM | POA: Diagnosis not present

## 2018-04-09 DIAGNOSIS — C3432 Malignant neoplasm of lower lobe, left bronchus or lung: Secondary | ICD-10-CM | POA: Diagnosis not present

## 2018-04-09 DIAGNOSIS — E871 Hypo-osmolality and hyponatremia: Secondary | ICD-10-CM | POA: Diagnosis not present

## 2018-04-09 DIAGNOSIS — Z923 Personal history of irradiation: Secondary | ICD-10-CM | POA: Insufficient documentation

## 2018-04-09 DIAGNOSIS — E876 Hypokalemia: Secondary | ICD-10-CM | POA: Insufficient documentation

## 2018-04-09 DIAGNOSIS — H9190 Unspecified hearing loss, unspecified ear: Secondary | ICD-10-CM | POA: Insufficient documentation

## 2018-04-09 DIAGNOSIS — Z79899 Other long term (current) drug therapy: Secondary | ICD-10-CM | POA: Insufficient documentation

## 2018-04-09 DIAGNOSIS — N644 Mastodynia: Secondary | ICD-10-CM | POA: Diagnosis present

## 2018-04-09 DIAGNOSIS — C8518 Unspecified B-cell lymphoma, lymph nodes of multiple sites: Secondary | ICD-10-CM | POA: Insufficient documentation

## 2018-04-09 DIAGNOSIS — Z87891 Personal history of nicotine dependence: Secondary | ICD-10-CM | POA: Insufficient documentation

## 2018-04-09 DIAGNOSIS — C3492 Malignant neoplasm of unspecified part of left bronchus or lung: Secondary | ICD-10-CM

## 2018-04-09 DIAGNOSIS — R200 Anesthesia of skin: Secondary | ICD-10-CM | POA: Diagnosis not present

## 2018-04-09 DIAGNOSIS — Z9221 Personal history of antineoplastic chemotherapy: Secondary | ICD-10-CM | POA: Diagnosis not present

## 2018-04-09 DIAGNOSIS — Z7982 Long term (current) use of aspirin: Secondary | ICD-10-CM | POA: Diagnosis not present

## 2018-04-09 LAB — CBC
HEMATOCRIT: 32.9 % — AB (ref 35.0–47.0)
HEMOGLOBIN: 10.8 g/dL — AB (ref 12.0–16.0)
MCH: 29.8 pg (ref 26.0–34.0)
MCHC: 32.9 g/dL (ref 32.0–36.0)
MCV: 90.3 fL (ref 80.0–100.0)
Platelets: 379 10*3/uL (ref 150–440)
RBC: 3.64 MIL/uL — ABNORMAL LOW (ref 3.80–5.20)
RDW: 19 % — AB (ref 11.5–14.5)
WBC: 6.9 10*3/uL (ref 3.6–11.0)

## 2018-04-09 MED ORDER — SODIUM CHLORIDE 0.9% FLUSH
10.0000 mL | Freq: Once | INTRAVENOUS | Status: AC
  Administered 2018-04-09: 10 mL via INTRAVENOUS
  Filled 2018-04-09: qty 10

## 2018-04-09 MED ORDER — HEPARIN SOD (PORK) LOCK FLUSH 100 UNIT/ML IV SOLN
500.0000 [IU] | Freq: Once | INTRAVENOUS | Status: AC
  Administered 2018-04-09: 500 [IU] via INTRAVENOUS

## 2018-04-10 ENCOUNTER — Ambulatory Visit
Admission: RE | Admit: 2018-04-10 | Discharge: 2018-04-10 | Disposition: A | Discharge status: Home / Self Care | Payer: Medicare HMO | Source: Ambulatory Visit | Attending: Radiation Oncology | Admitting: Radiation Oncology

## 2018-04-10 DIAGNOSIS — Z510 Encounter for antineoplastic radiation therapy: Secondary | ICD-10-CM | POA: Diagnosis not present

## 2018-04-11 ENCOUNTER — Ambulatory Visit
Admission: RE | Admit: 2018-04-11 | Discharge: 2018-04-11 | Disposition: A | Discharge status: Home / Self Care | Payer: Medicare HMO | Source: Ambulatory Visit | Attending: Radiation Oncology | Admitting: Radiation Oncology

## 2018-04-11 ENCOUNTER — Ambulatory Visit: Payer: Medicare HMO

## 2018-04-11 DIAGNOSIS — Z510 Encounter for antineoplastic radiation therapy: Secondary | ICD-10-CM | POA: Diagnosis not present

## 2018-04-12 ENCOUNTER — Ambulatory Visit
Admission: RE | Admit: 2018-04-12 | Discharge: 2018-04-12 | Disposition: A | Discharge status: Home / Self Care | Payer: Medicare HMO | Source: Ambulatory Visit | Attending: Radiation Oncology | Admitting: Radiation Oncology

## 2018-04-12 DIAGNOSIS — Z510 Encounter for antineoplastic radiation therapy: Secondary | ICD-10-CM | POA: Diagnosis not present

## 2018-04-15 ENCOUNTER — Ambulatory Visit
Admission: RE | Admit: 2018-04-15 | Discharge: 2018-04-15 | Disposition: A | Discharge status: Home / Self Care | Payer: Medicare HMO | Source: Ambulatory Visit | Attending: Radiation Oncology | Admitting: Radiation Oncology

## 2018-04-15 DIAGNOSIS — Z510 Encounter for antineoplastic radiation therapy: Secondary | ICD-10-CM | POA: Diagnosis not present

## 2018-04-16 ENCOUNTER — Ambulatory Visit: Payer: Medicare HMO

## 2018-04-18 ENCOUNTER — Ambulatory Visit: Payer: Medicare HMO

## 2018-05-02 ENCOUNTER — Ambulatory Visit: Payer: Medicare HMO | Admitting: Hematology and Oncology

## 2018-05-06 ENCOUNTER — Ambulatory Visit: Payer: Medicare HMO

## 2018-05-06 ENCOUNTER — Encounter: Payer: Self-pay | Admitting: Hematology and Oncology

## 2018-05-06 ENCOUNTER — Other Ambulatory Visit: Payer: Self-pay

## 2018-05-06 ENCOUNTER — Inpatient Hospital Stay: Payer: Medicare HMO | Attending: Hematology and Oncology | Admitting: Hematology and Oncology

## 2018-05-06 VITALS — BP 130/70 | HR 96 | Temp 95.4°F | Resp 18 | Wt 163.0 lb

## 2018-05-06 DIAGNOSIS — G620 Drug-induced polyneuropathy: Secondary | ICD-10-CM | POA: Insufficient documentation

## 2018-05-06 DIAGNOSIS — R634 Abnormal weight loss: Secondary | ICD-10-CM

## 2018-05-06 DIAGNOSIS — M25561 Pain in right knee: Secondary | ICD-10-CM

## 2018-05-06 DIAGNOSIS — Z87891 Personal history of nicotine dependence: Secondary | ICD-10-CM | POA: Diagnosis not present

## 2018-05-06 DIAGNOSIS — Z9221 Personal history of antineoplastic chemotherapy: Secondary | ICD-10-CM | POA: Insufficient documentation

## 2018-05-06 DIAGNOSIS — D649 Anemia, unspecified: Secondary | ICD-10-CM | POA: Diagnosis not present

## 2018-05-06 DIAGNOSIS — C8518 Unspecified B-cell lymphoma, lymph nodes of multiple sites: Secondary | ICD-10-CM | POA: Insufficient documentation

## 2018-05-06 DIAGNOSIS — C3432 Malignant neoplasm of lower lobe, left bronchus or lung: Secondary | ICD-10-CM | POA: Diagnosis not present

## 2018-05-06 DIAGNOSIS — Z7982 Long term (current) use of aspirin: Secondary | ICD-10-CM | POA: Diagnosis not present

## 2018-05-06 DIAGNOSIS — Z79899 Other long term (current) drug therapy: Secondary | ICD-10-CM | POA: Diagnosis not present

## 2018-05-06 DIAGNOSIS — G8929 Other chronic pain: Secondary | ICD-10-CM | POA: Diagnosis not present

## 2018-05-06 DIAGNOSIS — C3492 Malignant neoplasm of unspecified part of left bronchus or lung: Secondary | ICD-10-CM

## 2018-05-06 DIAGNOSIS — H9190 Unspecified hearing loss, unspecified ear: Secondary | ICD-10-CM | POA: Insufficient documentation

## 2018-05-06 DIAGNOSIS — M25562 Pain in left knee: Secondary | ICD-10-CM | POA: Diagnosis not present

## 2018-05-06 DIAGNOSIS — Z923 Personal history of irradiation: Secondary | ICD-10-CM | POA: Diagnosis not present

## 2018-05-06 DIAGNOSIS — E871 Hypo-osmolality and hyponatremia: Secondary | ICD-10-CM

## 2018-05-06 DIAGNOSIS — C7951 Secondary malignant neoplasm of bone: Secondary | ICD-10-CM

## 2018-05-06 DIAGNOSIS — Z7189 Other specified counseling: Secondary | ICD-10-CM

## 2018-05-06 DIAGNOSIS — E538 Deficiency of other specified B group vitamins: Secondary | ICD-10-CM | POA: Diagnosis not present

## 2018-05-06 DIAGNOSIS — T451X5A Adverse effect of antineoplastic and immunosuppressive drugs, initial encounter: Secondary | ICD-10-CM

## 2018-05-06 DIAGNOSIS — C782 Secondary malignant neoplasm of pleura: Secondary | ICD-10-CM | POA: Diagnosis not present

## 2018-05-06 LAB — FOLATE: Folate: 10.3 ng/mL (ref >5.9)

## 2018-05-06 LAB — BASIC METABOLIC PANEL
Anion gap: 7 (ref 5–15)
BUN: 14 mg/dL (ref 8–23)
CO2: 24 mmol/L (ref 22–32)
Calcium: 9.3 mg/dL (ref 8.9–10.3)
Chloride: 102 mmol/L (ref 98–111)
Creatinine, Ser: 1.03 mg/dL — ABNORMAL HIGH (ref 0.44–1.00)
GFR calc Af Amer: >60 mL/min (ref >60)
GFR calc non Af Amer: 52 mL/min — ABNORMAL LOW (ref >60)
Glucose, Bld: 129 mg/dL — ABNORMAL HIGH (ref 70–99)
Potassium: 4.1 mmol/L (ref 3.5–5.1)
Sodium: 133 mmol/L — ABNORMAL LOW (ref 135–145)

## 2018-05-06 LAB — CBC WITH DIFFERENTIAL/PLATELET
Basophils Absolute: 0.1 10*3/uL (ref 0–0.1)
Basophils Relative: 1 %
Eosinophils Absolute: 0.1 10*3/uL (ref 0–0.7)
Eosinophils Relative: 1 %
HCT: 34.3 % — ABNORMAL LOW (ref 35.0–47.0)
Hemoglobin: 11.4 g/dL — ABNORMAL LOW (ref 12.0–16.0)
Lymphocytes Relative: 11 %
Lymphs Abs: 0.8 10*3/uL — ABNORMAL LOW (ref 1.0–3.6)
MCH: 29.7 pg (ref 26.0–34.0)
MCHC: 33.3 g/dL (ref 32.0–36.0)
MCV: 89.3 fL (ref 80.0–100.0)
Monocytes Absolute: 0.5 10*3/uL (ref 0.2–0.9)
Monocytes Relative: 7 %
Neutro Abs: 5.9 10*3/uL (ref 1.4–6.5)
Neutrophils Relative %: 80 %
Platelets: 411 10*3/uL (ref 150–440)
RBC: 3.85 MIL/uL (ref 3.80–5.20)
RDW: 17.4 % — ABNORMAL HIGH (ref 11.5–14.5)
WBC: 7.4 10*3/uL (ref 3.6–11.0)

## 2018-05-06 LAB — IRON AND TIBC
Iron: 21 ug/dL — ABNORMAL LOW (ref 28–170)
Saturation Ratios: 6 % — ABNORMAL LOW (ref 10.4–31.8)
TIBC: 343 ug/dL (ref 250–450)
UIBC: 322 ug/dL

## 2018-05-06 LAB — VITAMIN B12: Vitamin B-12: 3458 pg/mL — ABNORMAL HIGH (ref 180–914)

## 2018-05-06 LAB — URIC ACID: Uric Acid, Serum: 2.8 mg/dL (ref 2.5–7.1)

## 2018-05-06 LAB — TSH: TSH: 1.734 u[IU]/mL (ref 0.350–4.500)

## 2018-05-06 LAB — FERRITIN: Ferritin: 14 ng/mL (ref 11–307)

## 2018-05-06 MED ORDER — DICLOFENAC SODIUM 1 % TD GEL: 2.0000 g | Freq: Three times a day (TID) | TRANSDERMAL | 1 refills | Status: DC | PRN

## 2018-05-06 MED ORDER — GABAPENTIN 100 MG PO CAPS: 100.0000 mg | ORAL_CAPSULE | Freq: Every day | ORAL | 0 refills | Status: DC

## 2018-05-06 MED ORDER — LIDOCAINE-PRILOCAINE 2.5-2.5 % EX CREA: TOPICAL_CREAM | CUTANEOUS | 3 refills | Status: DC

## 2018-05-06 NOTE — Patient Instructions (Addendum)
Rituximab injection What is this medicine? RITUXIMAB (ri TUX i mab) is a monoclonal antibody. It is used to treat certain types of cancer like non-Hodgkin lymphoma and chronic lymphocytic leukemia. It is also used to treat rheumatoid arthritis, granulomatosis with polyangiitis (or Wegener's granulomatosis), and microscopic polyangiitis. This medicine may be used for other purposes; ask your health care provider or pharmacist if you have questions. COMMON BRAND NAME(S): Rituxan What should I tell my health care provider before I take this medicine? They need to know if you have any of these conditions: -heart disease -infection (especially a virus infection such as hepatitis B, chickenpox, cold sores, or herpes) -immune system problems -irregular heartbeat -kidney disease -lung or breathing disease, like asthma -recently received or scheduled to receive a vaccine -an unusual or allergic reaction to rituximab, mouse proteins, other medicines, foods, dyes, or preservatives -pregnant or trying to get pregnant -breast-feeding How should I use this medicine? This medicine is for infusion into a vein. It is administered in a hospital or clinic by a specially trained health care professional. A special MedGuide will be given to you by the pharmacist with each prescription and refill. Be sure to read this information carefully each time. Talk to your pediatrician regarding the use of this medicine in children. This medicine is not approved for use in children. Overdosage: If you think you have taken too much of this medicine contact a poison control center or emergency room at once. NOTE: This medicine is only for you. Do not share this medicine with others. What if I miss a dose? It is important not to miss a dose. Call your doctor or health care professional if you are unable to keep an appointment. What may interact with this medicine? -cisplatin -other medicines for arthritis like disease  modifying antirheumatic drugs or tumor necrosis factor inhibitors -live virus vaccines This list may not describe all possible interactions. Give your health care provider a list of all the medicines, herbs, non-prescription drugs, or dietary supplements you use. Also tell them if you smoke, drink alcohol, or use illegal drugs. Some items may interact with your medicine. What should I watch for while using this medicine? Your condition will be monitored carefully while you are receiving this medicine. You may need blood work done while you are taking this medicine. This medicine can cause serious allergic reactions. To reduce your risk you may need to take medicine before treatment with this medicine. Take your medicine as directed. In some patients, this medicine may cause a serious brain infection that may cause death. If you have any problems seeing, thinking, speaking, walking, or standing, tell your doctor right away. If you cannot reach your doctor, urgently seek other source of medical care. Call your doctor or health care professional for advice if you get a fever, chills or sore throat, or other symptoms of a cold or flu. Do not treat yourself. This drug decreases your body's ability to fight infections. Try to avoid being around people who are sick. Do not become pregnant while taking this medicine or for 12 months after stopping it. Women should inform their doctor if they wish to become pregnant or think they might be pregnant. There is a potential for serious side effects to an unborn child. Talk to your health care professional or pharmacist for more information. What side effects may I notice from receiving this medicine? Side effects that you should report to your doctor or health care professional as soon as possible: -breathing   problems -chest pain -dizziness or feeling faint -fast, irregular heartbeat -low blood counts - this medicine may decrease the number of white blood cells,  red blood cells and platelets. You may be at increased risk for infections and bleeding. -mouth sores -redness, blistering, peeling or loosening of the skin, including inside the mouth (this can be added for any serious or exfoliative rash that could lead to hospitalization) -signs of infection - fever or chills, cough, sore throat, pain or difficulty passing urine -signs and symptoms of kidney injury like trouble passing urine or change in the amount of urine -signs and symptoms of liver injury like dark yellow or brown urine; general ill feeling or flu-like symptoms; light-colored stools; loss of appetite; nausea; right upper belly pain; unusually weak or tired; yellowing of the eyes or skin -stomach pain -vomiting Side effects that usually do not require medical attention (report to your doctor or health care professional if they continue or are bothersome): -headache -joint pain -muscle cramps or muscle pain This list may not describe all possible side effects. Call your doctor for medical advice about side effects. You may report side effects to FDA at 1-800-FDA-1088. Where should I keep my medicine? This drug is given in a hospital or clinic and will not be stored at home. NOTE: This sheet is a summary. It may not cover all possible information. If you have questions about this medicine, talk to your doctor, pharmacist, or health care provider.  2018 [employer] Standard (2016-02-23 15:28:09) Bendamustine Injection What is this medicine? BENDAMUSTINE (BEN da MUS teen) is a chemotherapy drug. It is used to treat chronic lymphocytic leukemia and non-Hodgkin lymphoma. This medicine may be used for other purposes; ask your health care provider or pharmacist if you have questions. COMMON BRAND NAME(S): BENDEKA, Treanda What should I tell my health care provider before I take this medicine? They need to know if you have any of these conditions: -infection (especially a virus infection such  as chickenpox, cold sores, or herpes) -kidney disease -liver disease -an unusual or allergic reaction to bendamustine, mannitol, other medicines, foods, dyes, or preservatives -pregnant or trying to get pregnant -breast-feeding How should I use this medicine? This medicine is for infusion into a vein. It is given by a health care professional in a hospital or clinic setting. Talk to your pediatrician regarding the use of this medicine in children. Special care may be needed. Overdosage: If you think you have taken too much of this medicine contact a poison control center or emergency room at once. NOTE: This medicine is only for you. Do not share this medicine with others. What if I miss a dose? It is important not to miss your dose. Call your doctor or health care professional if you are unable to keep an appointment. What may interact with this medicine? Do not take this medicine with any of the following medications: -clozapine This medicine may also interact with the following medications: -atazanavir -cimetidine -ciprofloxacin -enoxacin -fluvoxamine -medicines for seizures like carbamazepine and phenobarbital -mexiletine -rifampin -tacrine -thiabendazole -zileuton This list may not describe all possible interactions. Give your health care provider a list of all the medicines, herbs, non-prescription drugs, or dietary supplements you use. Also tell them if you smoke, drink alcohol, or use illegal drugs. Some items may interact with your medicine. What should I watch for while using this medicine? This drug may make you feel generally unwell. This is not uncommon, as chemotherapy can affect healthy cells as well as cancer  cells. Report any side effects. Continue your course of treatment even though you feel ill unless your doctor tells you to stop. You may need blood work done while you are taking this medicine. Call your doctor or health care professional for advice if you get a  fever, chills or sore throat, or other symptoms of a cold or flu. Do not treat yourself. This drug decreases your body's ability to fight infections. Try to avoid being around people who are sick. This medicine may increase your risk to bruise or bleed. Call your doctor or health care professional if you notice any unusual bleeding. Talk to your doctor about your risk of cancer. You may be more at risk for certain types of cancers if you take this medicine. Do not become pregnant while taking this medicine or for 3 months after stopping it. Women should inform their doctor if they wish to become pregnant or think they might be pregnant. Men should not father a child while taking this medicine and for 3 months after stopping it.There is a potential for serious side effects to an unborn child. Talk to your health care professional or pharmacist for more information. Do not breast-feed an infant while taking this medicine. This medicine may interfere with the ability to have a child. You should talk with your doctor or health care professional if you are concerned about your fertility. What side effects may I notice from receiving this medicine? Side effects that you should report to your doctor or health care professional as soon as possible: -allergic reactions like skin rash, itching or hives, swelling of the face, lips, or tongue -low blood counts - this medicine may decrease the number of white blood cells, red blood cells and platelets. You may be at increased risk for infections and bleeding. -redness, blistering, peeling or loosening of the skin, including inside the mouth -signs of infection - fever or chills, cough, sore throat, pain or difficulty passing urine -signs of decreased platelets or bleeding - bruising, pinpoint red spots on the skin, black, tarry stools, blood in the urine -signs of decreased red blood cells - unusually weak or tired, fainting spells, lightheadedness -signs and  symptoms of kidney injury like trouble passing urine or change in the amount of urine -signs and symptoms of liver injury like dark yellow or brown urine; general ill feeling or flu-like symptoms; light-colored stools; loss of appetite; nausea; right upper belly pain; unusually weak or tired; yellowing of the eyes or skin Side effects that usually do not require medical attention (report to your doctor or health care professional if they continue or are bothersome): -constipation -decreased appetite -diarrhea -headache -mouth sores -nausea/vomiting -tiredness This list may not describe all possible side effects. Call your doctor for medical advice about side effects. You may report side effects to FDA at 1-800-FDA-1088. Where should I keep my medicine? This drug is given in a hospital or clinic and will not be stored at home. NOTE: This sheet is a summary. It may not cover all possible information. If you have questions about this medicine, talk to your doctor, pharmacist, or health care provider.  2018 [employer] Standard (2015-05-20 08:45:41) Pembrolizumab injection What is this medicine? PEMBROLIZUMAB (pem broe liz ue mab) is a monoclonal antibody. It is used to treat melanoma, head and neck cancer, Hodgkin lymphoma, non-small cell lung cancer, urothelial cancer, stomach cancer, and cancers that have a certain genetic condition. This medicine may be used for other purposes; ask your health care  provider or pharmacist if you have questions. COMMON BRAND NAME(S): Keytruda What should I tell my health care provider before I take this medicine? They need to know if you have any of these conditions: -diabetes -immune system problems -inflammatory bowel disease -liver disease -lung or breathing disease -lupus -organ transplant -an unusual or allergic reaction to pembrolizumab, other medicines, foods, dyes, or preservatives -pregnant or trying to get pregnant -breast-feeding How  should I use this medicine? This medicine is for infusion into a vein. It is given by a health care professional in a hospital or clinic setting. A special MedGuide will be given to you before each treatment. Be sure to read this information carefully each time. Talk to your pediatrician regarding the use of this medicine in children. While this drug may be prescribed for selected conditions, precautions do apply. Overdosage: If you think you have taken too much of this medicine contact a poison control center or emergency room at once. NOTE: This medicine is only for you. Do not share this medicine with others. What if I miss a dose? It is important not to miss your dose. Call your doctor or health care professional if you are unable to keep an appointment. What may interact with this medicine? Interactions have not been studied. Give your health care provider a list of all the medicines, herbs, non-prescription drugs, or dietary supplements you use. Also tell them if you smoke, drink alcohol, or use illegal drugs. Some items may interact with your medicine. This list may not describe all possible interactions. Give your health care provider a list of all the medicines, herbs, non-prescription drugs, or dietary supplements you use. Also tell them if you smoke, drink alcohol, or use illegal drugs. Some items may interact with your medicine. What should I watch for while using this medicine? Your condition will be monitored carefully while you are receiving this medicine. You may need blood work done while you are taking this medicine. Do not become pregnant while taking this medicine or for 4 months after stopping it. Women should inform their doctor if they wish to become pregnant or think they might be pregnant. There is a potential for serious side effects to an unborn child. Talk to your health care professional or pharmacist for more information. Do not breast-feed an infant while taking this  medicine or for 4 months after the last dose. What side effects may I notice from receiving this medicine? Side effects that you should report to your doctor or health care professional as soon as possible: -allergic reactions like skin rash, itching or hives, swelling of the face, lips, or tongue -bloody or black, tarry -breathing problems -changes in vision -chest pain -chills -constipation -cough -dizziness or feeling faint or lightheaded -fast or irregular heartbeat -fever -flushing -hair loss -low blood counts - this medicine may decrease the number of white blood cells, red blood cells and platelets. You may be at increased risk for infections and bleeding. -muscle pain -muscle weakness -persistent headache -signs and symptoms of high blood sugar such as dizziness; dry mouth; dry skin; fruity breath; nausea; stomach pain; increased hunger or thirst; increased urination -signs and symptoms of kidney injury like trouble passing urine or change in the amount of urine -signs and symptoms of liver injury like dark urine, light-colored stools, loss of appetite, nausea, right upper belly pain, yellowing of the eyes or skin -stomach pain -sweating -weight loss Side effects that usually do not require medical attention (report to your doctor  or health care professional if they continue or are bothersome): -decreased appetite -diarrhea -tiredness This list may not describe all possible side effects. Call your doctor for medical advice about side effects. You may report side effects to FDA at 1-800-FDA-1088. Where should I keep my medicine? This drug is given in a hospital or clinic and will not be stored at home. NOTE: This sheet is a summary. It may not cover all possible information. If you have questions about this medicine, talk to your doctor, pharmacist, or health care provider.  2018 [employer] Standard (2016-04-25 12:29:36)

## 2018-05-06 NOTE — Progress Notes (Deleted)
[facility] day:  05/06/18  Chief Complaint: Toni Griffith is a 77 y.o. female with stage IVB Toni Griffith zone lymphoma who is seen for review of interval lung biopsy and discussion regarding direction of therapy.  HPI:  The patient was last seen in the medical oncology clinic on 02/01/2018.  At that time, she was feeling better. She had lost 9 pounds.  Sodium was 124.  Potassium was 3.0. PET scan revealed progressive disease. She was felt to have progressive lymphoma (right paraspinal area previously biopsied and pathology + lymphoma).  She was felt to possibly also have lung cancer given the PET + lesion in the lingula of the left lung (LLL nodule previously biopsy + for squamous cell lung cancer).  We discussed possible biopsy(ies) for confirmation.  She was admitted to [facility] from 02/01/2018 - 02/03/2018 with hyponatremia.  Chlorthalidone was discontinued.  She received IVF.  Discharge sodium was 132.  She underwent CT guided LUL nodule biopsy on 02/20/2018.  Pathology revealed squamous cell lung cancer.  She was seen by Rulon Abide, NP on 02/22/2018 for biopsy site pain.    Symptomatically, patient is doing well today. She denies any acute complaints. Patient has increased sodium intake. She is using Pedialyte, liquid IV, V8 juice. Her sodium increased to 138 on 02/24/2018. Patient doing well following her biopsy. Her previously reported pain to the biopsy site has essentially resolved. Patient notes that she has some pain when she lays down at times. Pain in her hips has resolved. She has intermittent issues with pain in her LEFT knee.   Patient's energy is "ok". She is living with her son. She is able to complete all of her activities of daily living independently.  Patient denies that she has experienced any B symptoms. She denies any interval infections. Patient advises that she maintains an adequate appetite. She is eating well. Weight today is  , which  compared to her last visit to the clinic, represents a 1 pound increase.   Patient complains of generalized pain rated 4/10 in the clinic today.   Past Medical History:  Diagnosis Date  . Arthritis   . 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 Deaf   . Hypertension     Past Surgical History:  Procedure Laterality Date  . CARDIAC SURGERY  1993  . CORONARY ARTERY BYPASS GRAFT    . CYST REMOVAL HAND    . PERIPHERAL VASCULAR CATHETERIZATION N/A 04/06/2016   Procedure: Glori Luis Cath Insertion;  Surgeon: Algernon Huxley, MD;  Location: [facility];  Service: Cardiovascular;  Laterality: N/A;    Family History  Problem Relation Age of Onset  . Lung cancer Mother   . Lung cancer Father   . Diabetes Maternal Uncle   . Myasthenia gravis Maternal Grandmother   . Leukemia Maternal Grandfather     Social History:  reports that she quit smoking about 2 years ago. She has never used smokeless tobacco. She reports that she drinks alcohol. She reports that she does not use drugs.  She smoked 1 1/2 packs per day for 20 years (30 pack year smoking history) until 10/2015.  She is hearing impaired (deaf).  She lives in [place] with her boyfriend.  Her daughter lives in [place].  Her daughter, [name]'s contact number is [phone].  She is living with her son.  The patient is accompanied her daughter, son, and the interpreter.  Allergies: No Known Allergies  Current Medications: Current Outpatient Medications  Medication Sig Dispense Refill  . acetaminophen (TYLENOL) 325 MG tablet Take 650 mg by mouth.    Marland Kitchen allopurinol (ZYLOPRIM) 300 MG tablet TAKE 1 TABLET BY MOUTH EVERY DAY 90 tablet 1  . aspirin EC 81 MG tablet Take 81 mg by mouth daily.    Marland Kitchen atorvastatin (LIPITOR) 80 MG tablet Take 80 mg by mouth every evening.     Marland Kitchen b complex vitamins tablet Take 1 tablet by mouth daily.    . Cholecalciferol (VITAMIN D3) 2000 units capsule Take 2,000 Units by mouth daily.    . diclofenac sodium  (VOLTAREN) 1 % GEL Apply 2 g topically 3 (three) times daily as needed (left knee). 1 Tube 1  . FLUoxetine (PROZAC) 40 MG capsule Take 40 mg by mouth daily.     Marland Kitchen HYDROcodone-acetaminophen (NORCO) 5-325 MG tablet Take 1 tablet by mouth every 6 (six) hours as needed for moderate pain. (Patient not taking: Reported on 03/13/2018) 30 tablet 0  . lidocaine-prilocaine (EMLA) cream Apply cream 1 hour before chemotherapy treatment and place small peive of saran wrap over cream to protect clothing 30 g 1  . ondansetron (ZOFRAN ODT) 4 MG disintegrating tablet Take 1 tablet (4 mg total) by mouth every 8 (eight) hours as needed for nausea or vomiting. 20 tablet 0  . pantoprazole (PROTONIX) 40 MG tablet Take 40 mg by mouth daily.     . traMADol (ULTRAM) 50 MG tablet Take 1 tablet (50 mg total) by mouth every 6 (six) hours as needed for moderate pain. 30 tablet 0  . vitamin B-12 (CYANOCOBALAMIN) 500 MCG tablet Take 500 mcg by mouth daily.    . vitamin E 400 UNIT capsule Take 400 Units by mouth daily.      No current facility-administered medications for this visit.    Facility-Administered Medications Ordered in Other Visits  Medication Dose Route Frequency Provider Last Rate Last Dose  . heparin lock flush 100 unit/mL  500 Units Intravenous Once Corcoran, Melissa C, MD      . sodium chloride flush (NS) 0.9 % injection 10 mL  10 mL Intravenous PRN Lequita Asal, MD   10 mL at 04/05/17 1034    Review of Systems  Constitutional: Positive for malaise/fatigue. Negative for chills, fever and weight loss (up 1 pound).       "I am feeling good".  Energy level is "ok".  HENT: Positive for hearing loss (Deaf). Negative for congestion, ear discharge, ear pain, nosebleeds, sinus pain and sore throat.   Eyes: Negative.  Negative for double vision, pain and discharge.  Respiratory: Negative.  Negative for cough, hemoptysis, sputum production and shortness of breath.   Cardiovascular: Negative.  Negative for chest  pain, palpitations, orthopnea, leg swelling and PND.  Gastrointestinal: Negative.  Negative for abdominal pain, blood in stool, constipation, diarrhea (resolved), melena, nausea and vomiting.  Genitourinary: Negative.  Negative for dysuria, frequency, hematuria and urgency.  Musculoskeletal: Positive for joint pain (hips; left knee). Negative for falls and myalgias.  Skin: Positive for rash (improved). Negative for itching.  Neurological: Negative for dizziness, focal weakness and weakness.       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  Does not bruise/bleed easily.  Psychiatric/Behavioral: Positive for depression. Negative for memory loss and suicidal ideas. The patient is not nervous/anxious and does not have insomnia.   All other systems reviewed and are negative.  Performance status (ECOG): 2 - Symptomatic, <  50% confined to bed  Vital Signs There were no vitals taken for this visit.  Physical Exam  Constitutional: She is oriented to person, place, and time and well-developed, well-nourished, and in no distress.  HENT:  Head: Normocephalic and atraumatic.  Wearing a tan scarf.  Eyes: Pupils are equal, round, and reactive to light. EOM are normal. No scleral icterus.  Hazel eyes.  Neck: Normal range of motion. Neck supple. No tracheal deviation present. No thyromegaly present.  Cardiovascular: Normal rate, regular rhythm and normal heart sounds. Exam reveals no gallop and no friction rub.  No murmur heard. Pulmonary/Chest: Effort normal and breath sounds normal. No respiratory distress. She has no wheezes. She has no rales.  Abdominal: Soft. Bowel sounds are normal. She exhibits no distension. There is no tenderness.  Musculoskeletal: Normal range of motion. She exhibits no edema or tenderness.  Lymphadenopathy:    She has no cervical adenopathy.    She has no axillary adenopathy.       Right: No inguinal and no supraclavicular adenopathy  present.       Left: No inguinal and no supraclavicular adenopathy present.  Neurological: She is alert and oriented to person, place, and time.  Skin: Skin is warm and dry. No rash noted. No erythema.  Psychiatric: Mood, affect and judgment normal.  Nursing note and vitals reviewed.   Radiology studies: 02/20/2016:  Chest, abdomen and pelvic CT revealed a 3.7 cm subpleural mass within the left lower lobe, a 1.1 cm right lower lobe subpleural nodule , 9 mm RUL nodule, and 0.4 cm lingular nodule.  Between the left second, third, and fourth ribs there was 1.9 cm soft tissue mass. There was a 2.5 cm low-attenuation nodule in the left lobe of the thyroid.   There were prominent left axillary nodes (up to 1.0 cm).   There was no definite mediastinal adenopathy. There was a 5.5 x 4.6 cm cystic lesion in the left hepatic lobe.  There was a 3.1 cm nodule in the right adrenal gland. There was a 1.2 cm right retrocaval lymph node with additional prominent subcentimeter retroperitoneal nodes. There was 1.0 cm left common iliac node. There was a 6.2 x 5.2 cm soft tissue mass within the right ilium.  02/21/2016:  Head MRI revealed no evidence of malignancy. Thyroid ultrasound on 02/21/2016 revealed a 2.4 cm solid nodule in the inferior aspect of the left lobe 03/03/2016:  PET scan revealed a hypermetabolic left lower lobe mass, bilateral pleural and pulmonary parenchymal nodular metastasis, a large pleural lesion invading the anterior left chest wall.   There was activity in the superior left ocular orbit.  There was a large lesion centered in the right iliac wing with soft tissue extension.  There were hypermetabolic right external iliac lymph nodes 06/06/2016:  PET scan revealed significant interval response to therapy.  There was significant decrease in size and FDG uptake associated with left lower lobe lung lesion.  There was resolution of left-sided pleural base metastasis and left axillary and anterior  mediastinal nodal metastasis.  There was resolution of previous hypermetabolic soft tissue metastasis/nodal metastasis posterior to the IVC.  There was marked improvement in hypermetabolic metastasis involving the right iliac wing.  There was persistent right upper lobe lung nodule which exhibits intense radiotracer uptake which may represent a focus of metastatic disease or synchronous primary lung neoplasm. 06/01/2017:  PET scan revealed no new or progressive findings.  The 9 mm RUL pulmonary nodule remained hypermetabolic (SUV 76.5, previously 7.3)  and no change in size.  The index lesion in the posterior left lower lobe was minimally smaller on CT imaging with very low level FDG uptake (SUV 1.6).  There was no change in the appearance at site of previous disease at the right iliac wing (SUV 1.9 compared to 2.6 previously). 01/29/2017:  PET scan revealed interval progression of hypermetabolic nodules in the right upper (13 mm compared to 9 mm; SUV 10) and left lower lobes (26 mm compared to 22 mm; SUV 7.5).  There was new hypermetabolic focus of activity along the anterior left pleura although no underlying pleural or lung mass was evident. 09/18/2017:  PET scan revealed a mixed appearance, with marked improvement in the prior 2 pulmonary nodules; but with new Deauville 5 left common iliac lymph node enlargement; new Deauville 5 right paraspinal lesion; and new Deauville 4 left infrahilar nodal activity.  There was a focus of accentuated metabolic activity which had been present along the right lower submandibular gland but currently seemed to project over the subcutaneous tissues without CT correlate. Current maximum SUV 10.5, previously 9.7.  There was increased accentuated activity along the left lateral spurring at L2-3. While typically such activity is inflammatory due to spurring, this had increased significantly since the prior exam and merit surveillance. There was a new 8 by 5 mm right upper lobe  pulmonary nodule, Deauville 2, which also merits surveillance. 01/29/2018:  PET scan revealed persistent and progressive areas of hypermetabolism within the right submandibular gland, lingula of the left lung, left common iliac nodal chain, and right lumbar paraspinal soft tissues. Findings were compatible with Deauville criteria 4 and 5.  There was a new nodule is identified within the lingula which demonstrated intense uptake compatible with Deauville criteria 5.    Assessment:  Toni Griffith is a 77 y.o. female with stage IVB B-cell lymphoma, unclassifiable, with features intermediate between diffuse large B-cell lymphoma and classical Hodgkin's lymphoma ("Farha Dano zone" lymphoma).  She underwent right iliac wing bone/bone marrow biopsy on 03/08/2016.  Head MRI on 02/21/2016 revealed no evidence of malignancy. 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   There was activity in the superior left ocular orbit.  There was a large lesion centered in the right iliac wing with soft tissue extension.  There were hypermetabolic right external iliac lymph nodes  She was admitted to [facility] in [facility].  She had renal failure and hypercalcemia. She was treated with IVF, calcitonin and Zometa.The patient has a history of coronary artery disease s/p CABG in 1993.  She has no history of heart failure.  She is deaf and requires an interpreter.  Echo on 04/04/2016 revealed an EF of 55-60%.  Hepatitis B and C testing was negative on 03/30/2016.   G6PD assay was normal.  She declined LP with MTX prophylaxis.  She received 6 cycles of mini-RCHOP (04/07/2016 - 07/21/2016).  She has had some transient fingertip numbness.  She received radiation to the right  iliac wing,  from 10/11/2016 - 11/01/2016.  She received 3000 cGy over 3 weeks.  PET scan on 01/29/2017 revealed interval progression of hypermetabolic nodules in the right upper (13 mm compared to 9 mm; SUV 10) and left lower lobes (26 mm compared to 22 mm; SUV 7.5).  There was new hypermetabolic focus of activity along the anterior left pleura although no underlying pleural or lung mass was evident.  CT guided left lower lobe nodule biopsy on 02/19/2017 confirmed squamous cell carcinoma.  She underwent SBRT 03/26/2017 - 04/09/2017.  She received 5000 cGy in 5 fractions to the left lower lobe lesion.  She underwent SBRT to a RUL lesion from 05/23/2017 - 06/18/2017.  CT guided left upper lobe nodule biopsy on 02/20/2018 revealed squamous cell lung cancer.  PET scan on 09/18/2017 revealed a mixed appearance, with marked improvement in the prior 2 pulmonary nodules; but with new Deauville 5 left common iliac lymph node enlargement; new Deauville 5 right paraspinal lesion; and new Deauville 4 left infrahilar nodal activity.  There was a focus of accentuated metabolic activity which had been present along the right lower submandibular gland but currently seemed to project over the subcutaneous tissues without CT correlate. Current maximum SUV 10.5, previously 9.7. There was increased accentuated activity along the left lateral spurring at L2-3. While typically such activity is inflammatory due to spurring, this had increased significantly since the prior exam and merit surveillance. There was a new 8 by 5 mm right upper lobe pulmonary nodule, Deauville 2.  CT guided right paraspinal lesion biopsy on 10/16/2017 was compatible with involvement by the patient's previously diagnosed lymphoma.  The case was referred to [facility] hematopathology.  The neoplastic cells were largely CD30 negative.  Repeat CD30 immunohistochemistry faintly stained > 10% of the large atypical cells. CD20 stained the scattered large atypical  cells and few small cells.   She received 3 cycles of brentuximab vedotin (11/23/2017 - 01/11/2018).  PET scan on 01/29/2018 revealed persistent and progressive areas of hypermetabolism within the right submandibular gland, lingula of the left lung, left common iliac nodal chain, and right lumbar paraspinal soft tissues. Findings were compatible with Deauville criteria 4 and 5.  There was a new nodule is identified within the lingula which demonstrated intense uptake compatible with Deauville criteria 5.   She has a normocytic anemia.  Labs on 04/17/2016 revealed an elevated ferritin (444), iron saturation (11%), low TIBC (240), B12 (2409), and folate (10.8).  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  B12 and folate were normal on 12/06/2017.  She was diagnosed with norovirus on 12/06/2017.  She presented with diarrhea.  Diarrhea has resolved.  She was admitted to [facility] from 01/20/2018 - 01/23/2018.  She presented with nausea, vomiting, diarrhea and generalized weakness. Work-up showed hypokalemia, hypomagnesemia, dehydration and UTI.  She was treated with IV fluids and empiric IV antibiotics. Potassium and magnesium were repleted. Urine culture showed E. coli.  She was discharged on Augmentin. She was switched from HCTZ to chorthalidone.  She was admitted to [facility] from 02/01/2018 - 02/03/2018 with hyponatremia.  Symptomatically, she is doing well. She denies any acute concerns. Patient complains of pain in her LEFT knee. Exam is grossly unremarkable.   Plan: 1. Squamous cell lung cancer:  Discuss results from LUL lung biopsy- squamous cell lung cancer.  She has 2 nodules biopsy positive for lung cancer (LUL and LLL) in the same lobe (T4 disease).  Discuss sending tumor for mutational analysis (Foundation One and PDL-1).  Discuss stereotactic radiation. Refer back to radiation oncology for further evaluation and possible treatment.   2. Pearline Cables zone lymphoma:  Discuss progressive disease documented by enlarging right paraspinal soft tissue mass (biopsy proven) and adenopathy (left common  iliac chain).  Discuss systemic therapy using bendamustine + Rituxan +/- polatuzumab vedotin. Side effects reviewed.  Discuss plan for treatment after completion of radiation. Patient provided with printed information today on polatuzumab vedotin for review at home. She has printed information on the bendamustine and Rituxan already. Patient encouraged to make a list of questions and/or concerns for further discussion prior to beginning therapy using this medication.   Continue allopurinol as previously prescribed for TLS prophylaxis.  3. Normocytic anemia:  Work-up:  CBC with diff, Coombs, retic, B12, folate, ferritin, iron studies. 4. Electrolyte abnormalities (hyponatemia):   Assess: BMP, uric acid. 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    Continue to monitor. 6. RTC based on drug approval - will need to call patient with appointment.    A total of (40+) minutes of face-to-face time was spent with the patient with greater than 50% of that time in counseling and care-coordination.    Honor Loh, NP  05/06/18, 12:23 AM   I saw and evaluated the patient, participating in the key portions of the service and reviewing pertinent diagnostic studies and records.  I reviewed the nurse practitioner's note and agree with the findings and the plan.  The assessment and plan were discussed with the patient.  Multiple questions were asked by the patient and answered.   Nolon Stalls, MD 03/04/18, 4:21 PM

## 2018-05-06 NOTE — Progress Notes (Signed)
Here for follow up. Needs renewal for Volatren gel ,emla cream. Kaylee here as interprtor to sign.

## 2018-05-06 NOTE — Progress Notes (Signed)
[facility] day:  05/06/18  Chief Complaint: Toni Griffith is a 77 y.o. female with stage IVB gray zone lymphoma who is seen for reassessment after completion of radiation and discussion regarding direction of therapy.  HPI:  The patient was last seen in the medical oncology clinic on 03/04/2018.  At that time, she was doing well. She denied any acute concerns. Patient complained of pain in her LEFT knee. Exam was grossly unremarkable. 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  She was referred to radiation oncology.  We discussed chemotherapy for her active lymphoma.  She was seen by Dr. Baruch Gouty on 03/13/2018.  Plan was for SBRT 6000 cGy in 5 fractions.  She received radiation from 04/09/2018 - 04/15/2018.  Foundation One on the lung biopsy on 03/15/2018 revealed MS-stable, tumor mutational burden 5 Muts/Mb, AKT2 amplification, CCNE1 amplification, CREBBP R1446L, KDR amplification, KIT amplification, PDGFRA amplification, PTEN loss exons 2-4, and EG31 splice site 517+6H>Y. Marland Kitchen  There were no alterations in EGFR, KRAS, RET, ALK, MET, ERBB2, BRAF, ROS1.  PDL-1 IHC analysis revealed a TPS score of 50%.  During the interim, patient is doing well. She denies any acute symptoms. Patient denies any shortness of breath. Energy level is "good". No fevers or sweats. Patient experienced some diarrhea a couple of days ago, which has since them resolved. No nausea, vomiting, or fevers. Patient continues to have neuropathy (burning) in her hands. She has difficulties signing to communicate because of her pain and neuropathy. Patient is not currently using anything to help with her neuropathy.   She continues to have pain in her LEFT knee. She was prescribed Voltaren gel while she was in the hospital. Patient notes that this intervention was effective, and is therefore asking for a refill today.   Patient advises that she maintains an adequate  appetite. She is eating well. Weight today is 163 lb (73.9 kg), which compared to her last visit to the clinic, represents a 5 pound decrease.     Patient denies pain in the clinic today.   Past Medical History:  Diagnosis Date  . Arthritis   . Cancer [facility])    lymphoma-right hip  . Deaf   . Hypertension     Past Surgical History:  Procedure Laterality Date  . CARDIAC SURGERY  1993  . CORONARY ARTERY BYPASS GRAFT    . CYST REMOVAL HAND    . PERIPHERAL VASCULAR CATHETERIZATION N/A 04/06/2016   Procedure: Glori Luis Cath Insertion;  Surgeon: Algernon Huxley, MD;  Location: [facility];  Service: Cardiovascular;  Laterality: N/A;    Family History  Problem Relation Age of Onset  . Lung cancer Mother   . Lung cancer Father   . Diabetes Maternal Uncle   . Myasthenia gravis Maternal Grandmother   . Leukemia Maternal Grandfather     Social History:  reports that she quit smoking about 2 years ago. She has never used smokeless tobacco. She reports that she drinks alcohol. She reports that she does not use drugs.  She smoked 1 1/2 packs per day for 20 years (30 pack year smoking history) until 10/2015.  She is hearing impaired (deaf).  She lives in [place] with her boyfriend.  Her daughter lives in [place].  Her daughter, [name]'s contact number is [phone].  She is living with her son.  The patient is accompanied her daughter, son, boyfriend, and the interpreter.  Allergies: No Known  Allergies  Current Medications: Current Outpatient Medications  Medication Sig Dispense Refill  . acetaminophen (TYLENOL) 325 MG tablet Take 650 mg by mouth.    Marland Kitchen allopurinol (ZYLOPRIM) 300 MG tablet TAKE 1 TABLET BY MOUTH EVERY DAY 90 tablet 1  . aspirin EC 81 MG tablet Take 81 mg by mouth daily.    Marland Kitchen atorvastatin (LIPITOR) 80 MG tablet Take 80 mg by mouth every evening.     Marland Kitchen b complex vitamins tablet Take 1 tablet by mouth daily.    . Cholecalciferol (VITAMIN D3) 2000 units capsule Take 2,000  Units by mouth daily.    . diclofenac sodium (VOLTAREN) 1 % GEL Apply 2 g topically 3 (three) times daily as needed (left knee). 1 Tube 1  . FLUoxetine (PROZAC) 40 MG capsule Take 40 mg by mouth daily.     Marland Kitchen HYDROcodone-acetaminophen (NORCO) 5-325 MG tablet Take 1 tablet by mouth every 6 (six) hours as needed for moderate pain. 30 tablet 0  . pantoprazole (PROTONIX) 40 MG tablet Take 40 mg by mouth daily.     . vitamin B-12 (CYANOCOBALAMIN) 500 MCG tablet Take 500 mcg by mouth daily.    . vitamin E 400 UNIT capsule Take 400 Units by mouth daily.     Marland Kitchen lidocaine-prilocaine (EMLA) cream Apply cream 1 hour before chemotherapy treatment and place small peive of saran wrap over cream to protect clothing (Patient not taking: Reported on 05/06/2018) 30 g 1  . ondansetron (ZOFRAN ODT) 4 MG disintegrating tablet Take 1 tablet (4 mg total) by mouth every 8 (eight) hours as needed for nausea or vomiting. (Patient not taking: Reported on 05/06/2018) 20 tablet 0  . traMADol (ULTRAM) 50 MG tablet Take 1 tablet (50 mg total) by mouth every 6 (six) hours as needed for moderate pain. (Patient not taking: Reported on 05/06/2018) 30 tablet 0   No current facility-administered medications for this visit.    Facility-Administered Medications Ordered in Other Visits  Medication Dose Route Frequency Provider Last Rate Last Dose  . heparin lock flush 100 unit/mL  500 Units Intravenous Once Corcoran, Melissa C, MD      . sodium chloride flush (NS) 0.9 % injection 10 mL  10 mL Intravenous PRN Lequita Asal, MD   10 mL at 04/05/17 1034    Review of Systems  Constitutional: Positive for malaise/fatigue. Negative for diaphoresis, fever and weight loss (down 5 pounds).       Energy level "good"  HENT: Positive for hearing loss (deaf).   Eyes: Negative.   Respiratory: Negative for cough, hemoptysis, sputum production and shortness of breath.   Cardiovascular: Negative for chest pain, palpitations, orthopnea, leg  swelling and PND.  Gastrointestinal: Positive for diarrhea (a few days ago; resolved). Negative for abdominal pain, blood in stool, constipation, melena, nausea and vomiting.  Genitourinary: Negative for dysuria, frequency, hematuria and urgency.  Musculoskeletal: Positive for back pain (lower) and joint pain (hips; LEFT knee). Negative for falls and myalgias.  Skin: Negative for itching and rash.  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 Negative for dizziness, tremors, weakness and headaches.  Endo/Heme/Allergies: Does not bruise/bleed easily.  Psychiatric/Behavioral: Positive for depression. Negative for memory loss and suicidal ideas. The patient is not nervous/anxious and does not have insomnia.   All other systems reviewed and are negative.  Performance status (ECOG): 2 - Symptomatic, <50% confined to bed  Vital Signs BP 130/70 (BP Location: Left Wrist, Patient Position: Sitting)   Pulse  96   Temp (!) 95.4 F (35.2 C) (Tympanic)   Resp 18   Wt 163 lb (73.9 kg)   BMI 29.81 kg/m   Physical Exam  Constitutional: She is oriented to person, place, and time and well-developed, well-nourished, and in no distress.  HENT:  Head: Normocephalic and atraumatic.  Short grey hair.  Dentures.  Eyes: Pupils are equal, round, and reactive to light. EOM are normal. No scleral icterus.  Hazel eyes  Neck: Normal range of motion. Neck supple. No tracheal deviation present. No thyromegaly present.  Cardiovascular: Normal rate, regular rhythm and normal heart sounds. Exam reveals no gallop and no friction rub.  No murmur heard. Pulmonary/Chest: Effort normal and breath sounds normal. No respiratory distress. She has no wheezes. She has no rales.  Abdominal: Soft. Bowel sounds are normal. She exhibits no distension. There is no tenderness.  Musculoskeletal: Normal range of motion. She exhibits no edema or tenderness.  Lymphadenopathy:    She has no cervical  adenopathy.    She has no axillary adenopathy.       Right: No inguinal and no supraclavicular adenopathy present.       Left: No inguinal and no supraclavicular adenopathy present.  Neurological: She is alert and oriented to person, place, and time.  Skin: Skin is warm and dry. No rash noted. No erythema.  Psychiatric: Mood, affect and judgment normal.  Nursing note and vitals reviewed.   Admissions: 02/20/2016 - 02/26/2016:  [facility]:  She had renal failure and hypercalcemia. She was treated with IVF, calcitonin and Zometa.The patient has a history of coronary artery disease s/p CABG in 1993.  She has no history of heart failure.  01/20/2018 - 01/23/2018:  [facility].  She presented with nausea, vomiting, diarrhea and generalized weakness. Work-up showed hypokalemia, hypomagnesemia, dehydration and UTI.  She was treated with IV fluids and empiric IV antibiotics. Potassium and magnesium were repleted. Urine culture showed E. coli.  She was discharged on Augmentin. She was switched from HCTZ to chorthalidone. 02/01/2018 - 02/03/2018:  [facility] with hyponatremia.   Radiology studies: 02/20/2016:  Chest, abdomen and pelvic CT revealed a 3.7 cm subpleural mass within the left lower lobe, a 1.1 cm right lower lobe subpleural nodule , 9 mm RUL nodule, and 0.4 cm lingular nodule.  Between the left second, third, and fourth ribs there was 1.9 cm soft tissue mass. There was a 2.5 cm low-attenuation nodule in the left lobe of the thyroid.   There were prominent left axillary nodes (up to 1.0 cm).   There was no definite mediastinal adenopathy. There was a 5.5 x 4.6 cm cystic lesion in the left hepatic lobe.  There was a 3.1 cm nodule in the right adrenal gland. There was a 1.2 cm right retrocaval lymph node with additional prominent subcentimeter retroperitoneal nodes. There was 1.0 cm left common iliac node. There was a 6.2 x 5.2 cm soft tissue mass  within the right ilium.  02/21/2016:  Head MRI revealed no evidence of malignancy.  02/21/2016:  Thyroid ultrasound revealed a 2.4 cm solid nodule in the inferior aspect of the left lobe 03/03/2016:  PET scan revealed a hypermetabolic left lower lobe mass, bilateral pleural and pulmonary parenchymal nodular metastasis, a large pleural lesion invading the anterior left chest wall.   There was activity in the superior left ocular orbit.  There was a large lesion centered in the right iliac wing with soft tissue  extension.  There were hypermetabolic right external iliac lymph nodes 06/06/2016:  PET scan revealed significant interval response to therapy.  There was significant decrease in size and FDG uptake associated with left lower lobe lung lesion.  There was resolution of left-sided pleural base metastasis and left axillary and anterior mediastinal nodal metastasis.  There was resolution of previous hypermetabolic soft tissue metastasis/nodal metastasis posterior to the IVC.  There was marked improvement in hypermetabolic metastasis involving the right iliac wing.  There was persistent right upper lobe lung nodule which exhibits intense radiotracer uptake which may represent a focus of metastatic disease or synchronous primary lung neoplasm. 06/01/2017:  PET scan revealed no new or progressive findings.  The 9 mm RUL pulmonary nodule remained hypermetabolic (SUV 02.6, previously 7.3) and no change in size.  The index lesion in the posterior left lower lobe was minimally smaller on CT imaging with very low level FDG uptake (SUV 1.6).  There was no change in the appearance at site of previous disease at the right iliac wing (SUV 1.9 compared to 2.6 previously). 01/29/2017:  PET scan revealed interval progression of hypermetabolic nodules in the right upper (13 mm compared to 9 mm; SUV 10) and left lower lobes (26 mm compared to 22 mm; SUV 7.5).  There was new hypermetabolic focus of activity along the anterior  left pleura although no underlying pleural or lung mass was evident. 09/18/2017:  PET scan revealed a mixed appearance, with marked improvement in the prior 2 pulmonary nodules; but with new Deauville 5 left common iliac lymph node enlargement; new Deauville 5 right paraspinal lesion; and new Deauville 4 left infrahilar nodal activity.  There was a focus of accentuated metabolic activity which had been present along the right lower submandibular gland but currently seemed to project over the subcutaneous tissues without CT correlate. Current maximum SUV 10.5, previously 9.7.  There was increased accentuated activity along the left lateral spurring at L2-3. While typically such activity is inflammatory due to spurring, this had increased significantly since the prior exam and merit surveillance. There was a new 8 by 5 mm right upper lobe pulmonary nodule, Deauville 2, which also merits surveillance. 01/29/2018:  PET scan revealed persistent and progressive areas of hypermetabolism within the right submandibular gland, lingula of the left lung, left common iliac nodal chain, and right lumbar paraspinal soft tissues. Findings were compatible with Deauville criteria 4 and 5.  There was a new nodule is identified within the lingula which demonstrated intense uptake compatible with Deauville criteria 5.    Assessment:  Simrat Kendrick is a 77 y.o. female with stage IVB B-cell lymphoma, unclassifiable, with features intermediate between diffuse large B-cell lymphoma and classical Hodgkin's lymphoma ("gray zone" lymphoma).  She underwent right iliac wing bone/bone marrow biopsy on 03/08/2016.  PET scan on 03/03/2016 revealed a hypermetabolic left lower lobe mass, bilateral pleural and pulmonary parenchymal nodular metastasis, a large pleural lesion invading the anterior left chest wall.   There was activity in the superior left ocular orbit.  There was a large lesion centered in the right iliac wing with soft tissue  extension.  There were hypermetabolic right external iliac lymph nodes  Echo on 04/04/2016 revealed an EF of 55-60%.  Hepatitis B and C testing was negative on 03/30/2016.   G6PD assay was normal.  She declined LP with MTX prophylaxis.  She received 6 cycles of mini-RCHOP (04/07/2016 - 07/21/2016).  She has had some transient fingertip numbness.  She received radiation to the  right iliac wing,  from 10/11/2016 - 11/01/2016.  She received 3000 cGy over 3 weeks.  PET scan on 01/29/2017 revealed interval progression of hypermetabolic nodules in the right upper (13 mm compared to 9 mm; SUV 10) and left lower lobes (26 mm compared to 22 mm; SUV 7.5).  There was new hypermetabolic focus of activity along the anterior left pleura although no underlying pleural or lung mass was evident.  She has squamous cell lung cancer.  Foundation One on the lung biopsy on 03/15/2018 revealed MS-stable, tumor mutational burden 5 Muts/Mb, AKT2 amplification, CCNE1 amplification, CREBBP R1446L, KDR amplification, KIT amplification, PDGFRA amplification, PTEN loss exons 2-4, and XN23 splice site 557+3U>K. Marland Kitchen  There were no alterations in EGFR, KRAS, RET, ALK, MET, ERBB2, BRAF, ROS1.  PDL-1 IHC analysis revealed a TPS score of 50%.  CT guided left lower lobe nodule biopsy on 02/19/2017 confirmed squamous cell carcinoma.  She underwent SBRT 03/26/2017 - 04/09/2017.  She received 5000 cGy in 5 fractions to the left lower lobe lesion.  She underwent SBRT to a RUL lesion from 05/23/2017 - 06/18/2017.  CT guided left upper lobe nodule biopsy on 02/20/2018 revealed squamous cell lung cancer.  She received SBRT from 04/09/2018 - 04/15/2018.   She received 6000 cGy in 5 fractions.    PET scan on 09/18/2017 revealed a mixed appearance, with marked improvement in the prior 2 pulmonary nodules; but with new Deauville 5 left common iliac lymph node enlargement; new Deauville 5 right paraspinal lesion; and new Deauville 4 left infrahilar  nodal activity.  There was a focus of accentuated metabolic activity which had been present along the right lower submandibular gland but currently seemed to project over the subcutaneous tissues without CT correlate. Current maximum SUV 10.5, previously 9.7. There was increased accentuated activity along the left lateral spurring at L2-3. While typically such activity is inflammatory due to spurring, this had increased significantly since the prior exam and merit surveillance. There was a new 8 by 5 mm right upper lobe pulmonary nodule, Deauville 2.  CT guided right paraspinal lesion biopsy on 10/16/2017 was compatible with involvement by the patient's previously diagnosed lymphoma.  The case was referred to [facility] hematopathology.  The neoplastic cells were largely CD30 negative.  Repeat CD30 immunohistochemistry faintly stained > 10% of the large atypical cells. CD20 stained the scattered large atypical cells and few small cells.   She received 3 cycles of brentuximab vedotin (11/23/2017 - 01/11/2018).  PET scan on 01/29/2018 revealed persistent and progressive areas of hypermetabolism within the right submandibular gland, lingula of the left lung, left common iliac nodal chain, and right lumbar paraspinal soft tissues. Findings were compatible with Deauville criteria 4 and 5.  There was a new nodule is identified within the lingula which demonstrated intense uptake compatible with Deauville criteria 5.   She has a normocytic anemia.  Labs on 04/17/2016 revealed an elevated ferritin (444), iron saturation (11%), low TIBC (240), B12 (2409), and folate (10.8).  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  B12 and folate were normal on 12/06/2017.  She is deaf and requires an interpreter.  Symptomatically, patient is doing well overall.  She denies any B symptoms.  She denies any shortness of breath.  She notes some minor diarrhea a few days ago that has resolved.  Patient with chronic pain in  her knees.  She has been using Voltaren gel with some improvement.  Patient with increased neuropathy in her hands  that is impeding her ability to effectively communicate through sign language.  Exam is grossly unremarkable.  Plan: 1. Labs today:  CBC with diff, BMP, uric acid, HBV sAg, HBV cAb, HCV Ab 2. Pearline Cables zone lymphoma  Discussed need for new baseline imaging prior to initiation of new treatment regimen.  Patient amenable.  Will schedule CT imaging of the chest, abdomen, and pelvis.  Discussed treatment with bendamustine + Rituxan Side effects briefly reviewed. Patient provided with printed information today on her AVS for review at home. Patient encouraged to make a list of questions and/or concerns for further discussion prior to beginning therapy using this medication.   Review plans for repeat imaging (CT or PET) following 3 complete cycles of bendamustine + Rituxan. 3. Squamous cell lung cancer  Discussed recent SBRT treatment.  Receive 6000 cGy in 5 fractions between 04/09/2018 and 04/15/2018.  Review results from molecular testing.    Foundation One on the lung biopsy on 03/15/2018 revealed MS-stable, tumor mutational burden 5 Muts/Mb, AKT2 amplification, CCNE1 amplification, CREBBP R1446L, KDR amplification, KIT amplification, PDGFRA amplification, PTEN loss exons 2-4, and WI20 splice site 355+9R>C. Marland Kitchen  There were no alterations in EGFR, KRAS, RET, ALK, MET, ERBB2, BRAF, ROS1.    PDL-1 IHC analysis revealed a TPS score of 50%.  Discuss addition of pembrolizumab if lung cancer progresses. 4. Chemotherapy-induced neuropathy  Acute progression in symptoms in her hands that is interfering with her ability to effectively communicate through sign language.  Discuss pharmacological intervention.  Patient amenable.  Discussed low-dose gabapentin and plans for up titration to effectiveness.  Rx: Gabapentin 100 mg at bedtime (Disp #30).  5. Normocytic anemia  Labs reviewed.  Hemoglobin  11.4, hematocrit 34.3, MCV 89.3, and platelets 411,000.  Ferritin low at 14.  Iron saturation 6% with a TIBC of 343.  Consider initiation of oral iron therapy at next visit. 6. Malignancy related SIADH  Sodium low at 133.  Encouraged patient to increase intake of electrolyte fortified fluids, such as Gatorade or Pedialyte, and efforts to prevent further electrolyte derangements. 7. B12 deficiency  B12 level high at 3458 pg/mL.  Folate normal at 10.3 ng/mL.  Patient on oral B12 500 mcg daily.  Consider reduction to every other day versus discontinuation. 8. RTC on 05/16/2018 for MD assessment, labs (CBC with diff, CMP, LDH, uric acid), review of scans, and cycle #1 bendamustine + Rituxan (days 1-2).    Honor Loh, NP  05/06/18, 12:40 PM   I saw and evaluated the patient, participating in the key portions of the service and reviewing pertinent diagnostic studies and records.  I reviewed the nurse practitioner's note and agree with the findings and the plan.  The assessment and plan were discussed with the patient.  Multiple questions were asked by the patient and answered.   Nolon Stalls, MD 05/06/18, 12:40 PM

## 2018-05-07 LAB — HEPATITIS B CORE ANTIBODY, TOTAL: Hep B Core Total Ab: NEGATIVE

## 2018-05-07 LAB — HEPATITIS B SURFACE ANTIGEN: Hepatitis B Surface Ag: NEGATIVE

## 2018-05-07 LAB — HEPATITIS C ANTIBODY: HCV Ab: <0.1 s/co ratio (ref 0.0–0.9)

## 2018-05-10 ENCOUNTER — Other Ambulatory Visit: Payer: Self-pay | Admitting: *Deleted

## 2018-05-10 MED ORDER — HYDROCODONE-ACETAMINOPHEN 5-325 MG PO TABS: 1.0000 | ORAL_TABLET | Freq: Four times a day (QID) | ORAL | 0 refills | Status: DC | PRN

## 2018-05-12 ENCOUNTER — Other Ambulatory Visit: Payer: Self-pay | Admitting: Hematology and Oncology

## 2018-05-14 ENCOUNTER — Ambulatory Visit
Admission: RE | Admit: 2018-05-14 | Discharge: 2018-05-14 | Disposition: A | Discharge status: Home / Self Care | Payer: Medicare HMO | Source: Ambulatory Visit | Attending: Urgent Care | Admitting: Urgent Care

## 2018-05-14 DIAGNOSIS — K449 Diaphragmatic hernia without obstruction or gangrene: Secondary | ICD-10-CM | POA: Diagnosis not present

## 2018-05-14 DIAGNOSIS — I700 Atherosclerosis of aorta: Secondary | ICD-10-CM | POA: Insufficient documentation

## 2018-05-14 DIAGNOSIS — C7951 Secondary malignant neoplasm of bone: Secondary | ICD-10-CM

## 2018-05-14 DIAGNOSIS — D3502 Benign neoplasm of left adrenal gland: Secondary | ICD-10-CM | POA: Insufficient documentation

## 2018-05-14 DIAGNOSIS — C3492 Malignant neoplasm of unspecified part of left bronchus or lung: Secondary | ICD-10-CM | POA: Diagnosis present

## 2018-05-14 DIAGNOSIS — D649 Anemia, unspecified: Secondary | ICD-10-CM | POA: Insufficient documentation

## 2018-05-14 DIAGNOSIS — D3501 Benign neoplasm of right adrenal gland: Secondary | ICD-10-CM | POA: Insufficient documentation

## 2018-05-14 DIAGNOSIS — C8518 Unspecified B-cell lymphoma, lymph nodes of multiple sites: Secondary | ICD-10-CM | POA: Diagnosis present

## 2018-05-14 DIAGNOSIS — J90 Pleural effusion, not elsewhere classified: Secondary | ICD-10-CM | POA: Diagnosis not present

## 2018-05-14 MED ORDER — IOPAMIDOL (ISOVUE-300) INJECTION 61%
100.0000 mL | Freq: Once | INTRAVENOUS | Status: AC | PRN
  Administered 2018-05-14: 100 mL via INTRAVENOUS

## 2018-05-16 ENCOUNTER — Inpatient Hospital Stay: Payer: Medicare HMO

## 2018-05-16 ENCOUNTER — Ambulatory Visit
Admission: RE | Admit: 2018-05-16 | Discharge: 2018-05-16 | Disposition: A | Discharge status: Home / Self Care | Payer: Medicare HMO | Source: Ambulatory Visit | Attending: Radiation Oncology | Admitting: Radiation Oncology

## 2018-05-16 ENCOUNTER — Inpatient Hospital Stay (HOSPITAL_BASED_OUTPATIENT_CLINIC_OR_DEPARTMENT_OTHER): Payer: Medicare HMO | Admitting: Hematology and Oncology

## 2018-05-16 VITALS — BP 115/72 | HR 89 | Temp 96.6°F | Resp 18 | Wt 164.2 lb

## 2018-05-16 DIAGNOSIS — C8580 Other specified types of non-Hodgkin lymphoma, unspecified site: Secondary | ICD-10-CM

## 2018-05-16 DIAGNOSIS — Z510 Encounter for antineoplastic radiation therapy: Secondary | ICD-10-CM | POA: Insufficient documentation

## 2018-05-16 DIAGNOSIS — C7951 Secondary malignant neoplasm of bone: Secondary | ICD-10-CM

## 2018-05-16 DIAGNOSIS — C8518 Unspecified B-cell lymphoma, lymph nodes of multiple sites: Secondary | ICD-10-CM

## 2018-05-16 DIAGNOSIS — C3492 Malignant neoplasm of unspecified part of left bronchus or lung: Secondary | ICD-10-CM

## 2018-05-16 DIAGNOSIS — Z5112 Encounter for antineoplastic immunotherapy: Secondary | ICD-10-CM | POA: Insufficient documentation

## 2018-05-16 DIAGNOSIS — C3432 Malignant neoplasm of lower lobe, left bronchus or lung: Secondary | ICD-10-CM

## 2018-05-16 DIAGNOSIS — T451X5A Adverse effect of antineoplastic and immunosuppressive drugs, initial encounter: Secondary | ICD-10-CM

## 2018-05-16 DIAGNOSIS — Z7189 Other specified counseling: Secondary | ICD-10-CM

## 2018-05-16 DIAGNOSIS — R634 Abnormal weight loss: Secondary | ICD-10-CM

## 2018-05-16 DIAGNOSIS — Z5111 Encounter for antineoplastic chemotherapy: Secondary | ICD-10-CM | POA: Insufficient documentation

## 2018-05-16 DIAGNOSIS — C8330 Diffuse large B-cell lymphoma, unspecified site: Secondary | ICD-10-CM

## 2018-05-16 DIAGNOSIS — C3412 Malignant neoplasm of upper lobe, left bronchus or lung: Secondary | ICD-10-CM | POA: Insufficient documentation

## 2018-05-16 DIAGNOSIS — G620 Drug-induced polyneuropathy: Secondary | ICD-10-CM

## 2018-05-16 DIAGNOSIS — D649 Anemia, unspecified: Secondary | ICD-10-CM

## 2018-05-16 DIAGNOSIS — R590 Localized enlarged lymph nodes: Secondary | ICD-10-CM

## 2018-05-16 LAB — COMPREHENSIVE METABOLIC PANEL
ALBUMIN: 3.2 g/dL — AB (ref 3.5–5.0)
ALT: 16 U/L (ref 0–44)
AST: 27 U/L (ref 15–41)
Alkaline Phosphatase: 75 U/L (ref 38–126)
Anion gap: 8 (ref 5–15)
BUN: 17 mg/dL (ref 8–23)
CHLORIDE: 104 mmol/L (ref 98–111)
CO2: 24 mmol/L (ref 22–32)
CREATININE: 0.88 mg/dL (ref 0.44–1.00)
Calcium: 9.1 mg/dL (ref 8.9–10.3)
GFR calc Af Amer: >60 mL/min (ref >60)
GFR calc non Af Amer: >60 mL/min (ref >60)
Glucose, Bld: 113 mg/dL — ABNORMAL HIGH (ref 70–99)
POTASSIUM: 3.8 mmol/L (ref 3.5–5.1)
Sodium: 136 mmol/L (ref 135–145)
Total Bilirubin: 0.8 mg/dL (ref 0.3–1.2)
Total Protein: 7.4 g/dL (ref 6.5–8.1)

## 2018-05-16 LAB — CBC WITH DIFFERENTIAL/PLATELET
ABS IMMATURE GRANULOCYTES: 0.02 10*3/uL (ref 0.00–0.07)
Basophils Absolute: 0.1 10*3/uL (ref 0.0–0.1)
Basophils Relative: 1 %
EOS PCT: 4 %
Eosinophils Absolute: 0.3 10*3/uL (ref 0.0–0.5)
HEMATOCRIT: 33.4 % — AB (ref 36.0–46.0)
HEMOGLOBIN: 11 g/dL — AB (ref 12.0–15.0)
Immature Granulocytes: 0 %
LYMPHS ABS: 0.9 10*3/uL (ref 0.7–4.0)
LYMPHS PCT: 13 %
MCH: 28.1 pg (ref 26.0–34.0)
MCHC: 32.9 g/dL (ref 30.0–36.0)
MCV: 85.4 fL (ref 80.0–100.0)
MONO ABS: 0.8 10*3/uL (ref 0.1–1.0)
Monocytes Relative: 11 %
Neutro Abs: 5.4 10*3/uL (ref 1.7–7.7)
Neutrophils Relative %: 71 %
Platelets: 353 10*3/uL (ref 150–400)
RBC: 3.91 MIL/uL (ref 3.87–5.11)
RDW: 15.9 % — ABNORMAL HIGH (ref 11.5–15.5)
WBC: 7.6 10*3/uL (ref 4.0–10.5)
nRBC: 0 % (ref 0.0–0.2)

## 2018-05-16 LAB — LACTATE DEHYDROGENASE: LDH: 102 U/L (ref 98–192)

## 2018-05-16 LAB — URIC ACID: Uric Acid, Serum: 3.3 mg/dL (ref 2.5–7.1)

## 2018-05-16 MED ORDER — SODIUM CHLORIDE 0.9% FLUSH
10.0000 mL | Freq: Once | INTRAVENOUS | Status: AC
  Administered 2018-05-16: 10 mL via INTRAVENOUS
  Filled 2018-05-16: qty 10

## 2018-05-16 MED ORDER — GABAPENTIN 100 MG PO CAPS: 100.0000 mg | ORAL_CAPSULE | Freq: Every day | ORAL | 0 refills | Status: DC

## 2018-05-16 MED ORDER — HEPARIN SOD (PORK) LOCK FLUSH 100 UNIT/ML IV SOLN
500.0000 [IU] | Freq: Once | INTRAVENOUS | Status: AC
  Administered 2018-05-16: 500 [IU] via INTRAVENOUS
  Filled 2018-05-16: qty 5

## 2018-05-16 NOTE — Progress Notes (Signed)
[facility] day:  05/16/18  Chief Complaint: Toni Griffith is a 77 y.o. female with stage IVB gray zone lymphoma who is seen for assessment prior to cycle #1 bendamustine and Rituxan.  HPI:  The patient was last seen in the medical oncology clinic on 05/06/2018.  At that time, she was doing well overall.  She denied any B symptoms.  She denied any shortness of breath.  She noted some minor diarrhea a few days prior that has resolved.  She had chronic knee pain and was using Voltaren gel with some improvement.  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  Exam was grossly unremarkable.  CBC revealed a hematocrit of 34.3, hemoglobin 11.4, MCV 89.3, platelets 411,000, WBC 7400 with an ANC of 5900.  Ferritin was 14 with an iron saturation of 6% and a TIBC of 343.  B12 (high), folate, and TSH were normal.  Repeat hepatitis serologies were negative.  Chest, abdomen, and pelvic CT on 05/15/2018 revealed enlarging mediastinal and right upper lobe nodule, indicative of disease progression. There were necrotic lingular nodules grossly stable in size with developing surrounding collapse/consolidation, possibly treatment related.  There was slight decrease in size of a left external iliac lymph node and right lumbar paraspinal soft tissue lesion.  There was a tiny left pleural effusion.  There were bilateral adrenal adenomas.  During the interim, patient is doing well. She denies any acute concerns. Patient notes that she is back to normal. Diarrhea has resolved. She has continued pain in her back, knees, and hips. Neuropathy in hands is stable. Patient denies that she has experienced any B symptoms. She denies any interval infections.   Patient advises that she maintains an adequate appetite. She is eating well. Weight today is 164 lb 4 oz (74.5 kg), which compared to her last visit to the  clinic, represents a 1 pound increase.   Patient denies pain in the clinic today.   Past Medical History:  Diagnosis Date  . Arthritis   . Cancer [place])    lymphoma-right hip  . Deaf   . Hypertension     Past Surgical History:  Procedure Laterality Date  . CARDIAC SURGERY  1993  . CORONARY ARTERY BYPASS GRAFT    . CYST REMOVAL HAND    . PERIPHERAL VASCULAR CATHETERIZATION N/A 04/06/2016   Procedure: Glori Luis Cath Insertion;  Surgeon: Algernon Huxley, MD;  Location: [facility];  Service: Cardiovascular;  Laterality: N/A;    Family History  Problem Relation Age of Onset  . Lung cancer Mother   . Lung cancer Father   . Diabetes Maternal Uncle   . Myasthenia gravis Maternal Grandmother   . Leukemia Maternal Grandfather     Social History:  reports that she quit smoking about 2 years ago. She has never used smokeless tobacco. She reports that she drinks alcohol. She reports that she does not use drugs.  She smoked 1 1/2 packs per day for 20 years (30 pack year smoking history) until 10/2015.  She is hearing impaired (deaf).  She lives in [place] with her boyfriend.  Her daughter lives in [place].  Her daughter, [name]'s contact number is [phone].  She is living with her son.  The patient is accompanied her daughter, son, boyfriend, and the interpreter.  Allergies: No Known Allergies  Current Medications: Current Outpatient Medications  Medication Sig Dispense Refill  .  acetaminophen (TYLENOL) 325 MG tablet Take 650 mg by mouth.    Marland Kitchen allopurinol (ZYLOPRIM) 300 MG tablet TAKE 1 TABLET BY MOUTH EVERY DAY 90 tablet 1  . aspirin EC 81 MG tablet Take 81 mg by mouth daily.    Marland Kitchen atorvastatin (LIPITOR) 80 MG tablet Take 80 mg by mouth every evening.     Marland Kitchen b complex vitamins tablet Take 1 tablet by mouth daily.    . Cholecalciferol (VITAMIN D3) 2000 units capsule Take 2,000 Units by mouth daily.    . diclofenac sodium (VOLTAREN) 1 % GEL Apply 2 g topically 3 (three) times daily  as needed (left knee). 1 Tube 1  . FLUoxetine (PROZAC) 40 MG capsule Take 40 mg by mouth daily.     Marland Kitchen gabapentin (NEURONTIN) 100 MG capsule Take 1 capsule (100 mg total) by mouth at bedtime. 30 capsule 0  . HYDROcodone-acetaminophen (NORCO) 5-325 MG tablet Take 1 tablet by mouth every 6 (six) hours as needed for moderate pain. 30 tablet 0  . lidocaine-prilocaine (EMLA) cream Apply cream 1 hour before chemotherapy treatment and place small peive of saran wrap over cream to protect clothing 30 g 3  . pantoprazole (PROTONIX) 40 MG tablet Take 40 mg by mouth daily.     . vitamin B-12 (CYANOCOBALAMIN) 500 MCG tablet Take 500 mcg by mouth daily.    . vitamin E 400 UNIT capsule Take 400 Units by mouth daily.     . ondansetron (ZOFRAN ODT) 4 MG disintegrating tablet Take 1 tablet (4 mg total) by mouth every 8 (eight) hours as needed for nausea or vomiting. (Patient not taking: Reported on 05/06/2018) 20 tablet 0  . traMADol (ULTRAM) 50 MG tablet Take 1 tablet (50 mg total) by mouth every 6 (six) hours as needed for moderate pain. (Patient not taking: Reported on 05/06/2018) 30 tablet 0   No current facility-administered medications for this visit.    Facility-Administered Medications Ordered in Other Visits  Medication Dose Route Frequency Provider Last Rate Last Dose  . heparin lock flush 100 unit/mL  500 Units Intravenous Once Corcoran, Melissa C, MD      . heparin lock flush 100 unit/mL  500 Units Intravenous Once Corcoran, Melissa C, MD      . sodium chloride flush (NS) 0.9 % injection 10 mL  10 mL Intravenous PRN Lequita Asal, MD   10 mL at 04/05/17 1034    Review of Systems  Constitutional: Positive for malaise/fatigue. Negative for diaphoresis, fever and weight loss (up 1 pound).       Feels "fine".  HENT: Positive for hearing loss (deaf). Negative for congestion, nosebleeds, sinus pain and sore throat.   Eyes: Negative.  Negative for double vision, photophobia, pain, discharge and  redness.  Respiratory: Negative for cough, hemoptysis, sputum production and shortness of breath.   Cardiovascular: Negative.  Negative for chest pain, palpitations, leg swelling and PND.  Gastrointestinal: Negative.  Negative for abdominal pain, blood in stool, constipation, diarrhea, heartburn, melena, nausea and vomiting.  Genitourinary: Negative.  Negative for dysuria, frequency, hematuria and urgency.  Musculoskeletal: Positive for joint pain (painful left knee). Negative for back pain, falls, myalgias and neck pain.  Skin: Negative for itching and rash.  Neurological: Positive for sensory change (grade I neuropathy in hands; R>L) and focal weakness. Negative for dizziness, tingling, tremors, weakness and headaches.  Endo/Heme/Allergies: Does not bruise/bleed easily.  Psychiatric/Behavioral: Negative for depression, memory loss and suicidal ideas. The patient is not nervous/anxious and does  not have insomnia.   All other systems reviewed and are negative.  Performance status (ECOG): 2  Vital Signs BP 115/72 (BP Location: Left Arm, Patient Position: Sitting)   Pulse 89   Temp (!) 96.6 F (35.9 C) (Tympanic)   Resp 18   Wt 164 lb 4 oz (74.5 kg)   BMI 30.04 kg/m   Physical Exam  Constitutional: She is oriented to person, place, and time and well-developed, well-nourished, and in no distress. No distress.  HENT:  Head: Normocephalic and atraumatic.  Mouth/Throat: No oropharyngeal exudate.  Short gray hair.  Dentures.  Eyes: Pupils are equal, round, and reactive to light. Conjunctivae and EOM are normal. No scleral icterus.  Hazel eyes  Neck: Normal range of motion. Neck supple. No JVD present.  Cardiovascular: Normal rate and normal heart sounds. Exam reveals no gallop and no friction rub.  No murmur heard. Pulmonary/Chest: Effort normal and breath sounds normal. No respiratory distress. She has no wheezes. She has no rales.  Abdominal: Soft. Bowel sounds are normal. She exhibits  no distension and no mass. There is no abdominal tenderness. There is no rebound and no guarding.  Musculoskeletal: Normal range of motion.        General: No tenderness or edema.  Lymphadenopathy:    She has no cervical adenopathy.    She has no axillary adenopathy.       Right: No supraclavicular adenopathy present.       Left: No supraclavicular adenopathy present.  Neurological: She is alert and oriented to person, place, and time.  Skin: Skin is warm and dry. No rash noted. She is not diaphoretic. No erythema. No pallor.  Psychiatric: Mood, affect and judgment normal.  Nursing note reviewed.   Admissions: 02/20/2016 - 02/26/2016:  [facility] in [facility]:  She had renal failure and hypercalcemia. She was treated with IVF, calcitonin and Zometa.The patient has a history of coronary artery disease s/p CABG in 1993.  She has no history of heart failure.  01/20/2018 - 01/23/2018:  [facility].  She presented with nausea, vomiting, diarrhea and generalized weakness. Work-up showed hypokalemia, hypomagnesemia, dehydration and UTI.  She was treated with IV fluids and empiric IV antibiotics. Potassium and magnesium were repleted. Urine culture showed E. coli.  She was discharged on Augmentin. She was switched from HCTZ to chorthalidone. 02/01/2018 - 02/03/2018:  [facility] with hyponatremia.   Radiology studies: 02/20/2016:  Chest, abdomen and pelvic CT revealed a 3.7 cm subpleural mass within the left lower lobe, a 1.1 cm right lower lobe subpleural nodule , 9 mm RUL nodule, and 0.4 cm lingular nodule.  Between the left second, third, and fourth ribs there was 1.9 cm soft tissue mass. There was a 2.5 cm low-attenuation nodule in the left lobe of the thyroid.   There were prominent left axillary nodes (up to 1.0 cm).   There was no definite mediastinal adenopathy. There was a 5.5 x 4.6 cm cystic lesion in the left hepatic lobe.  There was a 3.1 cm nodule in the  right adrenal gland. There was a 1.2 cm right retrocaval lymph node with additional prominent subcentimeter retroperitoneal nodes. There was 1.0 cm left common iliac node. There was a 6.2 x 5.2 cm soft tissue mass within the right ilium.  02/21/2016:  Head MRI revealed no evidence of malignancy.  02/21/2016:  Thyroid ultrasound revealed a 2.4 cm solid nodule in the inferior aspect of the left lobe 03/03/2016:  PET scan revealed a hypermetabolic left lower lobe mass, bilateral pleural and pulmonary parenchymal nodular metastasis, a large pleural lesion invading the anterior left chest wall.   There was activity in the superior left ocular orbit.  There was a large lesion centered in the right iliac wing with soft tissue extension.  There were hypermetabolic right external iliac lymph nodes 06/06/2016:  PET scan revealed significant interval response to therapy.  There was significant decrease in size and FDG uptake associated with left lower lobe lung lesion.  There was resolution of left-sided pleural base metastasis and left axillary and anterior mediastinal nodal metastasis.  There was resolution of previous hypermetabolic soft tissue metastasis/nodal metastasis posterior to the IVC.  There was marked improvement in hypermetabolic metastasis involving the right iliac wing.  There was persistent right upper lobe lung nodule which exhibits intense radiotracer uptake which may represent a focus of metastatic disease or synchronous primary lung neoplasm. 06/01/2017:  PET scan revealed no new or progressive findings.  The 9 mm RUL pulmonary nodule remained hypermetabolic (SUV 65.4, previously 7.3) and no change in size.  The index lesion in the posterior left lower lobe was minimally smaller on CT imaging with very low level FDG uptake (SUV 1.6).  There was no change in the appearance at site of previous disease at the right iliac wing (SUV 1.9 compared to 2.6 previously). 01/29/2017:  PET scan revealed interval  progression of hypermetabolic nodules in the right upper (13 mm compared to 9 mm; SUV 10) and left lower lobes (26 mm compared to 22 mm; SUV 7.5).  There was new hypermetabolic focus of activity along the anterior left pleura although no underlying pleural or lung mass was evident. 09/18/2017:  PET scan revealed a mixed appearance, with marked improvement in the prior 2 pulmonary nodules; but with new Deauville 5 left common iliac lymph node enlargement; new Deauville 5 right paraspinal lesion; and new Deauville 4 left infrahilar nodal activity.  There was a focus of accentuated metabolic activity which had been present along the right lower submandibular gland but currently seemed to project over the subcutaneous tissues without CT correlate. Current maximum SUV 10.5, previously 9.7.  There was increased accentuated activity along the left lateral spurring at L2-3. While typically such activity is inflammatory due to spurring, this had increased significantly since the prior exam and merit surveillance. There was a new 8 by 5 mm right upper lobe pulmonary nodule, Deauville 2, which also merits surveillance. 01/29/2018:  PET scan revealed persistent and progressive areas of hypermetabolism within the right submandibular gland, lingula of the left lung, left common iliac nodal chain, and right lumbar paraspinal soft tissues. Findings were compatible with Deauville criteria 4 and 5.  There was a new nodule is identified within the lingula which demonstrated intense uptake compatible with Deauville criteria 5.  05/15/2018:  Chest, abdomen, and pelvic CT revealed enlarging mediastinal and right upper lobe nodule, indicative of disease progression. There were necrotic lingular nodules grossly stable in size with developing surrounding collapse/consolidation, possibly treatment related.  There was slight decrease in size of a left external iliac lymph node and right lumbar paraspinal soft tissue lesion.  There was a  tiny left pleural effusion.  There were bilateral adrenal adenomas.   Assessment:  Octa Uplinger is a 77 y.o. female with stage IVB B-cell lymphoma, unclassifiable, with features intermediate between diffuse large B-cell lymphoma and classical Hodgkin's lymphoma ("gray zone" lymphoma).  She underwent right iliac wing bone/bone marrow biopsy on  03/08/2016.  PET scan on 03/03/2016 revealed a hypermetabolic left lower lobe mass, bilateral pleural and pulmonary parenchymal nodular metastasis, a large pleural lesion invading the anterior left chest wall.   There was activity in the superior left ocular orbit.  There was a large lesion centered in the right iliac wing with soft tissue extension.  There were hypermetabolic right external iliac lymph nodes  Echo on 04/04/2016 revealed an EF of 55-60%.  Hepatitis B and C testing was negative on 03/30/2016.   G6PD assay was normal.  She declined LP with MTX prophylaxis.  She received 6 cycles of mini-RCHOP (04/07/2016 - 07/21/2016).  She has had some transient fingertip numbness.  She received radiation to the right iliac wing,  from 10/11/2016 - 11/01/2016.  She received 3000 cGy over 3 weeks.  PET scan on 01/29/2017 revealed interval progression of hypermetabolic nodules in the right upper (13 mm compared to 9 mm; SUV 10) and left lower lobes (26 mm compared to 22 mm; SUV 7.5).  There was new hypermetabolic focus of activity along the anterior left pleura although no underlying pleural or lung mass was evident.  She has squamous cell lung cancer.  Foundation One on the lung biopsy on 03/15/2018 revealed MS-stable, tumor mutational burden 5 Muts/Mb, AKT2 amplification, CCNE1 amplification, CREBBP R1446L, KDR amplification, KIT amplification, PDGFRA amplification, PTEN loss exons 2-4, and TK24 splice site 097+[id number]>H. Marland Kitchen  There were no alterations in EGFR, KRAS, RET, ALK, MET, ERBB2, BRAF, ROS1.  PDL-1 IHC analysis revealed a TPS score of 50%.  CT guided left  lower lobe nodule biopsy on 02/19/2017 confirmed squamous cell carcinoma.  She underwent SBRT 03/26/2017 - 04/09/2017.  She received 5000 cGy in 5 fractions to the left lower lobe lesion.  She underwent SBRT to a RUL lesion from 05/23/2017 - 06/18/2017.  CT guided left upper lobe nodule biopsy on 02/20/2018 revealed squamous cell lung cancer.  She received SBRT from 04/09/2018 - 04/15/2018.   She received 6000 cGy in 5 fractions.    PET scan on 09/18/2017 revealed a mixed appearance, with marked improvement in the prior 2 pulmonary nodules; but with new Deauville 5 left common iliac lymph node enlargement; new Deauville 5 right paraspinal lesion; and new Deauville 4 left infrahilar nodal activity.  There was a focus of accentuated metabolic activity which had been present along the right lower submandibular gland but currently seemed to project over the subcutaneous tissues without CT correlate. Current maximum SUV 10.5, previously 9.7. There was increased accentuated activity along the left lateral spurring at L2-3. While typically such activity is inflammatory due to spurring, this had increased significantly since the prior exam and merit surveillance. There was a new 8 by 5 mm right upper lobe pulmonary nodule, Deauville 2.  CT guided right paraspinal lesion biopsy on 10/16/2017 was compatible with involvement by the patient's previously diagnosed lymphoma.  [facility].  The neoplastic cells were largely CD30 negative.  Repeat CD30 immunohistochemistry faintly stained > 10% of the large atypical cells. CD20 stained the scattered large atypical cells and few small cells.   She received 3 cycles of brentuximab vedotin (11/23/2017 - 01/11/2018).  PET scan on 01/29/2018 revealed persistent and progressive areas of hypermetabolism within the right submandibular gland, lingula of the left lung, left common iliac nodal chain, and right lumbar paraspinal soft tissues. Findings  were compatible with Deauville criteria 4 and 5.  There was a new nodule is identified within the lingula which demonstrated  intense uptake compatible with Deauville criteria 5.   Chest, abdomen, and pelvic CT on 05/15/2018 revealed enlarging mediastinal and right upper lobe nodule, indicative of disease progression. There were necrotic lingular nodules grossly stable in size with developing surrounding collapse/consolidation, possibly treatment related.  There was slight decrease in size of a left external iliac lymph node and right lumbar paraspinal soft tissue lesion.  There was a tiny left pleural effusion.  There were bilateral adrenal adenomas.  She has a normocytic anemia.  Labs on 04/17/2016 revealed an elevated ferritin (444), iron saturation (11%), low TIBC (240), B12 (2409), and folate (10.8).  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  B12 and folate were normal on 12/06/2017.  She is deaf and requires an interpreter.  Symptomatically, she denies any B symptoms.  Exam reveals no adenopathy or hepatosplenomegaly.  Plan: 1.   Labs today:  CBC with diff, CMP, LDH, uric acid. 2.   Pearline Cables zone lymphoma: Discuss interval scans.  Imaging personally reviewed.  Agree with radiology interpretation. Unclear if progressive mediastinal adenopathy represents progressive lymphoma or squamous cell lung cancer. Discuss need for biopsy..  Discuss referral for bronchoscopy. If unable to perform biopsy or biopsy inconclusive, discuss decision to proceed with continued treatment for lymphoma and short term follow-up scan. No chemotherapy today. 3.   Squamous cell lung cancer:  Patient s/p 2 documented squamous cell lung cancers.  Discuss concern that adenopathy represents lung cancer.  Discuss plan for bronchoscopy.  If squamous cell lung cancer documented, discuss pembrolizumab as PDL-1 high. 4.   Follow-up with Dr. Baruch Gouty today. 5.   Consult pulmonary medicine. 6.   Phone follow-up  with Dr. Glean Salen at [facility]. 7.   RTC based on discussion with pulmonary medicine and [facility].  A total of (> 25) minutes of face-to-face time was spent with the patient with greater than 50% of that time in counseling and care-coordination.    Honor Loh, NP  05/16/18, 9:09 AM   I saw and evaluated the patient, participating in the key portions of the service and reviewing pertinent diagnostic studies and records.  I reviewed the nurse practitioner's note and agree with the findings and the plan.  The assessment and plan were discussed with the patient.  Multiple questions were asked by the patient and answered.   Nolon Stalls, MD 05/16/18, 9:09 AM

## 2018-05-16 NOTE — Progress Notes (Signed)
Patient offers no complaints today. Accompanied by daughter, son and interpreter.

## 2018-05-16 NOTE — Progress Notes (Signed)
Radiation Oncology Follow up Note  Name: Toni Griffith   Date:   05/16/2018 MRN:  [id number] DOB: [birth year]    This 77 y.o. female presents to the clinic today for one-month follow-up status post SB RT treatment to her left lung lingula for squamous cell carcinoma stage I.  REFERRING PROVIDER: Dionne Bucy., MD  HPI: patient is a 77 year old deaf female well-known to department having received multiple radiation courses of treatment. She been treated to her pelvis for palliation for B-cell lymphoma also received radiation therapy to her bilateral lungs and just completed radiation therapy to her left lung for squamous cell carcinoma. She seen today in routine follow-up and is doing well specifically denies cough hemoptysis or chest tightness. Unfortunately recent CT scan shows progression of mediastinal adenopathy. She is being referred for possible biopsy probably with EBUS. Area of the left lung previously treated shows evidence of necrosis and treatment results with evolving radiation changes..  COMPLICATIONS OF TREATMENT: none  FOLLOW UP COMPLIANCE: keeps appointments   PHYSICAL EXAM:  There were no vitals taken for this visit. Wheelchair-bound female in NAD.Well-developed well-nourished patient in NAD. HEENT reveals PERLA, EOMI, discs not visualized.  Oral cavity is clear. No oral mucosal lesions are identified. Neck is clear without evidence of cervical or supraclavicular adenopathy. Lungs are clear to A&P. Cardiac examination is essentially unremarkable with regular rate and rhythm without murmur rub or thrill. Abdomen is benign with no organomegaly or masses noted. Motor sensory and DTR levels are equal and symmetric in the upper and lower extremities. Cranial nerves II through XII are grossly intact. Proprioception is intact. No peripheral adenopathy or edema is identified. No motor or sensory levels are noted. Crude visual fields are within normal range.  RADIOLOGY RESULTS: recent  CT scan reviewed and compatible with the above-stated findings  PLAN: present time patient will be evaluated for her progressive mediastinal adenopathy. This certainly could be lymphoma versus locally advanced squamous cell carcinoma of the lung. Will make further treatment recognition's once biopsy is obtained.I have asked to see her back in 3 months for follow-up. We'll be happy to reevaluate the patient any time should further radiation therapy be indicated. Will discuss the case personally with medical oncology when biopsy results are complete.  I would like to take this opportunity to thank you for allowing me to participate in the care of your patient.Noreene Filbert, MD

## 2018-05-17 ENCOUNTER — Telehealth: Payer: Self-pay

## 2018-05-17 ENCOUNTER — Encounter: Payer: Self-pay | Admitting: Pulmonary Disease

## 2018-05-17 ENCOUNTER — Ambulatory Visit: Payer: Medicare HMO | Admitting: Radiation Oncology

## 2018-05-17 ENCOUNTER — Other Ambulatory Visit: Payer: Self-pay | Admitting: Urgent Care

## 2018-05-17 ENCOUNTER — Inpatient Hospital Stay: Payer: Medicare HMO

## 2018-05-17 ENCOUNTER — Telehealth: Payer: Self-pay | Admitting: *Deleted

## 2018-05-17 ENCOUNTER — Ambulatory Visit: Payer: Medicare HMO | Admitting: Pulmonary Disease

## 2018-05-17 VITALS — BP 120/72 | HR 100 | Ht 60.0 in | Wt 165.0 lb

## 2018-05-17 DIAGNOSIS — R590 Localized enlarged lymph nodes: Secondary | ICD-10-CM | POA: Diagnosis not present

## 2018-05-17 DIAGNOSIS — C3490 Malignant neoplasm of unspecified part of unspecified bronchus or lung: Secondary | ICD-10-CM

## 2018-05-17 DIAGNOSIS — Z23 Encounter for immunization: Secondary | ICD-10-CM | POA: Diagnosis not present

## 2018-05-17 DIAGNOSIS — C8590 Non-Hodgkin lymphoma, unspecified, unspecified site: Secondary | ICD-10-CM

## 2018-05-17 MED ORDER — GABAPENTIN 100 MG PO CAPS: 200.0000 mg | ORAL_CAPSULE | Freq: Every day | ORAL | 3 refills | Status: DC

## 2018-05-17 NOTE — Progress Notes (Signed)
Subjective:    Patient ID: Toni Griffith, female    DOB: 14-Apr-1941, 77 y.o.   MRN: [id number]  HPI the patient is a 77 year old former smoker, who was kindly referred by Dr. Nolon Stalls for evaluation of mediastinal adenopathy. I actually discussed the case with [name] during tumor board yesterday. 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 The patient's son is also present during the visit and helps with history taking.  The patient has been noted to have stage IVB gray zone lymphoma, she had received six cycles of mini - RCHOP in 2017. She has also been diagnosed with squamous cell carcinoma of the lung. She has had SBRT on two occasions for different lesions. Has received radiation to the right iliac wing that had metastatic deposits. Most recently her chest CT showed enlargement of pretrial notes that are concerning for progression of her squamous cell carcinoma. One of these notes is necrotic. There is concerned that this may represent also lymphoma and we are asked to evaluate her for potential bronchoscopy with endobronchial ultrasound for diagnosis.   The patient does not endorse any be symptoms at present. She has not had any cough. This not endorse any chest pain. She has not had dyspnea however she does not exert herself much. She is plagued with osteoarthritis of the knees. 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  Her past medical history, surgical history, family history and social history have been reviewed. She smoked two packs of cigarettes per day for 40 years. She quit in 2017.  She desires to have flu vaccine today.  Review of Systems  Constitutional: Negative.   HENT: Positive for hearing loss.   Eyes: Negative.   Respiratory: Negative.   Cardiovascular: Negative.   Gastrointestinal: Negative.   Musculoskeletal: Positive for  arthralgias.  Skin: Negative.   Neurological: Positive for numbness.  Hematological: Negative.   Psychiatric/Behavioral: Negative.        Objective:   Physical Exam  Constitutional: She is oriented to person, place, and time. She appears well-developed.  Overweight elderly woman, and no acute distress. She is non-toxic appearing.  HENT:  Head: Normocephalic and atraumatic.  Nose: Nose normal.  Mouth/Throat: Oropharynx is clear and moist. No oropharyngeal exudate.  Patient is deaf.  Eyes: Pupils are equal, round, and reactive to light. Conjunctivae and EOM are normal. No scleral icterus.  Neck: Normal range of motion. Neck supple. No JVD present. No tracheal deviation present. No thyromegaly present.  Cardiovascular: Normal rate, regular rhythm, normal heart sounds and intact distal pulses.  Pulmonary/Chest: Effort normal and breath sounds normal. No stridor. No respiratory distress. She has no wheezes. She has no rales.  Abdominal: Soft. She exhibits no distension.  Musculoskeletal: Normal range of motion. She exhibits deformity (Osteoarthritis changes, hands, knees.). She exhibits no edema.  Lymphadenopathy:    She has no cervical adenopathy.  Neurological: She is alert and oriented to person, place, and time.  Patient is deaf. No focal deficits noted otherwise.  Skin: Skin is warm and dry. No rash noted. No erythema. No pallor.  Psychiatric: She has a normal mood and affect.   I have reviewed the patient's laboratory data and chest imaging. I reviewed the chest images with the patient and her son.      Assessment & Plan:   1) Mediastinal adenopathy:  the patient has enlarging mediastinal notes  these are located in the pre carinal area and some on the pre aortic area. The precarinal nodes are amenable to sampling with endobronchial ultrasound. I suspect that this is related to squamous cell carcinoma given that one of these nodes has central necrosis. However, the patient's  history of lymphoma adds complexity to this decision. Biopsy will be necessary. The sample will also be placed on RPMI media for flow cytometry. The patient understands the potential benefits, limitations and complications of the procedure and agrees to go ahead. She understands that this will be done under general anesthesia. Procedure has been tentatively scheduled for 31 October at 1 PM.  2) Squamous cell carcinoma of the lung: this issue adds complexity to her management. It appears that she may have now mediastinal adenopathy related to this.  3) Grey zone lymphoma : this issue further adds complexity to the management.  4) Influenza vaccine administered.   Thank you for allowing me to participate in this patient's care.

## 2018-05-17 NOTE — H&P (View-Only) (Signed)
Subjective:    Patient ID: Toni Griffith, female    DOB: 1941/03/27, 77 y.o.   MRN: [id number]  HPI the patient is a 77 year old former smoker, who was kindly referred by Dr. Nolon Stalls for evaluation of mediastinal adenopathy. I actually discussed the case with [name] during tumor board yesterday. 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 The patient's son is also present during the visit and helps with history taking.  The patient has been noted to have stage IVB gray zone lymphoma, she had received six cycles of mini - RCHOP in 2017. She has also been diagnosed with squamous cell carcinoma of the lung. She has had SBRT on two occasions for different lesions. Has received radiation to the right iliac wing that had metastatic deposits. Most recently her chest CT showed enlargement of pretrial notes that are concerning for progression of her squamous cell carcinoma. One of these notes is necrotic. There is concerned that this may represent also lymphoma and we are asked to evaluate her for potential bronchoscopy with endobronchial ultrasound for diagnosis.   The patient does not endorse any be symptoms at present. She has not had any cough. This not endorse any chest pain. She has not had dyspnea however she does not exert herself much. She is plagued with osteoarthritis of the knees. 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  Her past medical history, surgical history, family history and social history have been reviewed. She smoked two packs of cigarettes per day for 40 years. She quit in 2017.  She desires to have flu vaccine today.  Review of Systems  Constitutional: Negative.   HENT: Positive for hearing loss.   Eyes: Negative.   Respiratory: Negative.   Cardiovascular: Negative.   Gastrointestinal: Negative.   Musculoskeletal: Positive for  arthralgias.  Skin: Negative.   Neurological: Positive for numbness.  Hematological: Negative.   Psychiatric/Behavioral: Negative.        Objective:   Physical Exam  Constitutional: She is oriented to person, place, and time. She appears well-developed.  Overweight elderly woman, and no acute distress. She is non-toxic appearing.  HENT:  Head: Normocephalic and atraumatic.  Nose: Nose normal.  Mouth/Throat: Oropharynx is clear and moist. No oropharyngeal exudate.  Patient is deaf.  Eyes: Pupils are equal, round, and reactive to light. Conjunctivae and EOM are normal. No scleral icterus.  Neck: Normal range of motion. Neck supple. No JVD present. No tracheal deviation present. No thyromegaly present.  Cardiovascular: Normal rate, regular rhythm, normal heart sounds and intact distal pulses.  Pulmonary/Chest: Effort normal and breath sounds normal. No stridor. No respiratory distress. She has no wheezes. She has no rales.  Abdominal: Soft. She exhibits no distension.  Musculoskeletal: Normal range of motion. She exhibits deformity (Osteoarthritis changes, hands, knees.). She exhibits no edema.  Lymphadenopathy:    She has no cervical adenopathy.  Neurological: She is alert and oriented to person, place, and time.  Patient is deaf. No focal deficits noted otherwise.  Skin: Skin is warm and dry. No rash noted. No erythema. No pallor.  Psychiatric: She has a normal mood and affect.   I have reviewed the patient's laboratory data and chest imaging. I reviewed the chest images with the patient and her son.      Assessment & Plan:   1) Mediastinal adenopathy:  the patient has enlarging mediastinal notes  these are located in the pre carinal area and some on the pre aortic area. The precarinal nodes are amenable to sampling with endobronchial ultrasound. I suspect that this is related to squamous cell carcinoma given that one of these nodes has central necrosis. However, the patient's  history of lymphoma adds complexity to this decision. Biopsy will be necessary. The sample will also be placed on RPMI media for flow cytometry. The patient understands the potential benefits, limitations and complications of the procedure and agrees to go ahead. She understands that this will be done under general anesthesia. Procedure has been tentatively scheduled for 31 October at 1 PM.  2) Squamous cell carcinoma of the lung: this issue adds complexity to her management. It appears that she may have now mediastinal adenopathy related to this.  3) Grey zone lymphoma : this issue further adds complexity to the management.  4) Influenza vaccine administered.   Thank you for allowing me to participate in this patient's care.

## 2018-05-17 NOTE — Telephone Encounter (Signed)
Left message on [name]'s voice mail prescription sent

## 2018-05-17 NOTE — Patient Instructions (Addendum)
1) You has a flu shot today.  2) You will be called with the pre-assessment clinic date and time as well as the procedure date and time.  3) The procedure has been tentatively scheduled for October 31 at 1 PM, however you will receive confirmation.  4) For the procedure day you will need to be fasting from midnight the night  Prior.

## 2018-05-17 NOTE — Telephone Encounter (Signed)
[name] called and states we were to refill Toni Griffith Gabapentin at in increased dose, Please advise

## 2018-05-17 NOTE — Telephone Encounter (Signed)
SX: [facility] Date: 05/30/18 @13 :00 [facility] CPT: 20802, 31653 Diagnosis: Mediastinal adenopathy

## 2018-05-17 NOTE — Telephone Encounter (Signed)
I sent in a refill yesterday. They can increase the dose as discussed to 200 mg. I updated the sig and just sent a new one. All should be correct at this point.

## 2018-05-20 ENCOUNTER — Ambulatory Visit: Payer: Medicare HMO | Admitting: Radiation Oncology

## 2018-05-20 ENCOUNTER — Telehealth: Payer: Self-pay

## 2018-05-20 NOTE — Telephone Encounter (Signed)
Spoke to [employer], regarding bronchoscopy pre-op apt, and surgery date. No further questions at this time.

## 2018-05-20 NOTE — Telephone Encounter (Signed)
LM at home # for daughter to call Re: bronchoscopy.

## 2018-05-24 ENCOUNTER — Encounter
Admission: RE | Admit: 2018-05-24 | Discharge: 2018-05-24 | Disposition: A | Discharge status: Home / Self Care | Payer: Medicare HMO | Source: Ambulatory Visit | Attending: Pulmonary Disease | Admitting: Pulmonary Disease

## 2018-05-24 ENCOUNTER — Other Ambulatory Visit: Payer: Self-pay

## 2018-05-24 DIAGNOSIS — Z01810 Encounter for preprocedural cardiovascular examination: Secondary | ICD-10-CM | POA: Diagnosis present

## 2018-05-24 DIAGNOSIS — I10 Essential (primary) hypertension: Secondary | ICD-10-CM | POA: Diagnosis not present

## 2018-05-24 HISTORY — DX: Gastro-esophageal reflux disease without esophagitis: K21.9

## 2018-05-24 NOTE — Patient Instructions (Signed)
Your procedure is scheduled on: May 30, 2018 THURSDAY Report to Day Surgery on the 2nd floor of the [facility]. To find out your arrival time, please call [phone] between 1PM - 3PM on: May 29, 2018 [facility]  REMEMBER: Instructions that are not followed completely may result in serious medical risk, up to and including death; or upon the discretion of your surgeon and anesthesiologist your surgery may need to be rescheduled.  Do not eat food after midnight the night before surgery.  No gum chewing, lozengers or hard candies.  You may however, drink CLEAR liquids up to 2 hours before you are scheduled to arrive for your surgery. Do not drink anything within 2 hours of the start of your surgery.  Clear liquids include: - water   Do NOT drink anything that is not on this list.  No Alcohol for 24 hours before or after surgery.  No Smoking including e-cigarettes for 24 hours prior to surgery.  No chewable tobacco products for at least 6 hours prior to surgery.  No nicotine patches on the day of surgery.  On the morning of surgery brush your teeth with toothpaste and water, you may rinse your mouth with mouthwash if you wish. Do not swallow any toothpaste or mouthwash.  Notify your doctor if there is any change in your medical condition (cold, fever, infection).  Do not wear jewelry, make-up, hairpins, clips or nail polish.  Do not wear lotions, powders, or perfumes.   Do not shave 48 hours prior to surgery.   Contacts and dentures may not be worn into surgery.  Do not bring valuables to the hospital, including drivers license, insurance or credit cards.  [facility] is not responsible for any belongings or valuables.   TAKE THESE MEDICATIONS THE MORNING OF SURGERY: PROTONIX  TAKE A DOSE THE NIGHT BEFORE SURGERY AND DAY OF SURGERY  ALLOPURINOL PAIN PILL IF NEEDED  Follow recommendations from Cardiologist, Pulmonologist or PCP regarding stopping Aspirin  Stop  Anti-inflammatories (NSAIDS) such as Advil, Aleve, Ibuprofen, Motrin, Naproxen, Naprosyn and Aspirin based products such as Excedrin, Goodys Powder, BC Powder. (May take Tylenol or Acetaminophen if needed.)  Stop ANY OVER THE COUNTER supplements until after surgery VIT E  (May continue Vitamin D, Vitamin B, and multivitamin.)  Wear comfortable clothing (specific to your surgery type) to the hospital.  Plan for stool softeners for home use.  If you are being discharged the day of surgery, you will not be allowed to drive home. You will need a responsible adult to drive you home and stay with you that night.   If you are taking public transportation, you will need to have a responsible adult with you. Please confirm with your physician that it is acceptable to use public transportation.   Please call [phone] if you have any questions about these instructions.

## 2018-05-28 ENCOUNTER — Telehealth: Payer: Self-pay | Admitting: *Deleted

## 2018-05-28 NOTE — Telephone Encounter (Signed)
Patient has developed a "Knot " on her left rib cage and it feels like it is burning all the time. Please advise what she is to do

## 2018-05-28 NOTE — Telephone Encounter (Signed)
  Unsure about this burning sensation associated with a "knot".  Shingles?  She will need to be seen.  M

## 2018-05-28 NOTE — Telephone Encounter (Signed)
Appointment for tomorrow for [facility] Management [facility] accepted

## 2018-05-29 ENCOUNTER — Inpatient Hospital Stay (HOSPITAL_BASED_OUTPATIENT_CLINIC_OR_DEPARTMENT_OTHER): Payer: Medicare HMO | Admitting: Oncology

## 2018-05-29 ENCOUNTER — Encounter: Payer: Self-pay | Admitting: Oncology

## 2018-05-29 VITALS — BP 116/77 | HR 97 | Temp 97.2°F | Resp 18

## 2018-05-29 DIAGNOSIS — C3432 Malignant neoplasm of lower lobe, left bronchus or lung: Secondary | ICD-10-CM | POA: Diagnosis not present

## 2018-05-29 DIAGNOSIS — Z79899 Other long term (current) drug therapy: Secondary | ICD-10-CM

## 2018-05-29 DIAGNOSIS — G620 Drug-induced polyneuropathy: Secondary | ICD-10-CM | POA: Diagnosis not present

## 2018-05-29 DIAGNOSIS — Z923 Personal history of irradiation: Secondary | ICD-10-CM

## 2018-05-29 DIAGNOSIS — C8518 Unspecified B-cell lymphoma, lymph nodes of multiple sites: Secondary | ICD-10-CM

## 2018-05-29 DIAGNOSIS — C782 Secondary malignant neoplasm of pleura: Secondary | ICD-10-CM | POA: Diagnosis not present

## 2018-05-29 DIAGNOSIS — E538 Deficiency of other specified B group vitamins: Secondary | ICD-10-CM

## 2018-05-29 DIAGNOSIS — C3492 Malignant neoplasm of unspecified part of left bronchus or lung: Secondary | ICD-10-CM

## 2018-05-29 DIAGNOSIS — Z7982 Long term (current) use of aspirin: Secondary | ICD-10-CM

## 2018-05-29 DIAGNOSIS — D649 Anemia, unspecified: Secondary | ICD-10-CM

## 2018-05-29 DIAGNOSIS — Z9221 Personal history of antineoplastic chemotherapy: Secondary | ICD-10-CM

## 2018-05-29 DIAGNOSIS — H9190 Unspecified hearing loss, unspecified ear: Secondary | ICD-10-CM

## 2018-05-29 DIAGNOSIS — Z87891 Personal history of nicotine dependence: Secondary | ICD-10-CM

## 2018-05-29 DIAGNOSIS — G8929 Other chronic pain: Secondary | ICD-10-CM

## 2018-05-29 NOTE — Progress Notes (Signed)
Patient in symptom management clinic today to be evaluated for an area of swelling and burning in her left side/abdomen x 1 week.

## 2018-05-29 NOTE — Progress Notes (Signed)
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   Current Treatment: Has not started chemo with bendamustine and Rituxan for lymphoma. Awaiting bronchoscopy scheduled for tomorrow 05/30/2018.  S/p sbrt on 04/09/18-04/15/18 for lung ca.   Oncology History: Patient seen by primary medical oncologist [name] on 05/16/2018 where she reported to be doing well.  She denied any acute concerns.  Her appetite remained stable and she denied any pain.  She was started on 100 mg gabapentin at that time for acute progression and neuropathy in her hands that was interfering with her ability to to effectively communicate through sign language.   Had CT abdomen/pelvis with contrast on 05/14/2018 that revealed enlarging mediastinal adenopathy and right upper lobe nodule indicative of disease progression.  Slight decrease in size of left external iliac lymph node and right lumbar paraspinal soft tissue lesion.   Given patient has two primary cancers, a bronchoscopy for confirmation was scheduled for 05/30/2018.  After results, chemotherapy will be initiated.  Had follow-up with Dr. Donella Stade on 05/16/2018 after receiving SBRT.  She was discharged from his clinic.    Oncology History   Patient has history of stage IVB B-cell lymphoma, unclassifiable, with features intermediate between diffuse large B-cell lymphoma and classical Hodgkin's lymphoma ("gray zone" lymphoma).  She underwent right iliac wing bone/bone marrow biopsy on 03/08/2016.  Head MRI on 02/21/2016  revealed no evidence of malignancy. 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   There was activity in the superior left ocular orbit.  There was a large lesion centered in the right iliac wing with soft tissue extension.  There were hypermetabolic right external iliac lymph nodes  She was admitted to [facility] in [facility].  She had renal failure and hypercalcemia. She was treated with IVF, calcitonin and Zometa.The patient has a history of coronary artery disease s/p CABG in 1993.  She has no history of heart failure.  She is deaf and requires an interpreter.  Echo on 04/04/2016 revealed an EF of 55-60%.  Hepatitis B and C testing was negative on 03/30/2016.   G6PD assay was normal.  She declined LP with MTX prophylaxis.  She received 6 cycles of mini-RCHOP (04/07/2016 - 07/21/2016).  She has had some transient fingertip numbness.  She received radiationto the right iliac wing, from 10/11/2016 - 11/01/2016. She received 3000 cGy over 3 weeks.  PET scan on 01/29/2017 revealed interval progression of hypermetabolic nodules in the right upper (13 mm compared to 9 mm; SUV 10) and left lower lobes (26 mm compared to 22 mm; SUV 7.5).  There was new hypermetabolic focus of activity along the anterior left pleura although no underlying pleural or lung mass was evident.  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  She underwent SBRT 03/26/2017 - 04/09/2017.  She received 5000  cGy in 5 fractions to the left lower lobe lesion.  She underwent SBRT to a RUL lesion from 05/23/2017 - 06/18/2017.  PET scan on 09/18/2017 revealed a mixed appearance, with marked improvement in the prior 2  pulmonary nodules; but with new Deauville 5 left common iliac lymph node enlargement; new Deauville 5 right paraspinal lesion; and new Deauville 4 left infrahilar nodal activity.  There was a focus of accentuated metabolic activity which had been present along the right lower submandibular gland but currently seemed to project over the subcutaneous tissues without CT correlate. Current maximum SUV 10.5, previously 9.7. There was increased accentuated activity along the left lateral spurring at L2-3. While typically such activity is inflammatory due to spurring, this had increased significantly since the prior exam and merit surveillance. There was a new 8 by 5 mm right upper lobe pulmonary nodule, Deauville 2.  CT guided right paraspinal lesion biopsy on 10/16/2017 was compatible with involvement by the patient's previously diagnosed lymphoma.  The case was referred to [facility] hematopathology.  The neoplastic cells were largely CD30 negative.  Repeat CD30 immunohistochemistry faintly stained > 10% of the large atypical cells. CD20 stained the scattered large atypical cells and few small cells.   She initiated brentuximab vedotin/Adcetris on 11/23/17.   She has a normocytic anemia.  Labs on 04/17/2016 revealed an elevated ferritin (444), iron saturation (11%), low TIBC (240), B12 (2409), and folate (10.8).  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  B12 and folate were normal on 12/06/2017.      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     02/10/2018 -  Chemotherapy    The patient had palonosetron (ALOXI) injection 0.25 mg, 0.25 mg, Intravenous,  Once,  0 of 4 cycles riTUXimab (RITUXAN) 700 mg in sodium chloride 0.9 % 250 mL (2.1875 mg/mL) infusion, 375 mg/m2 = 700 mg, Intravenous,  Once, 0 of 4 cycles bendamustine (BENDEKA) 125 mg in sodium chloride 0.9 % 50 mL (2.2727 mg/mL) chemo infusion, 70 mg/m2 = 125 mg (100 % of original dose 70 mg/m2), Intravenous,  Once, 0 of 4 cycles Dose modification: 70 mg/m2 (original dose 70 mg/m2, Cycle 1, Reason: Patient Age, Comment: Advance as tolerated)  for chemotherapy treatment.      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     02/10/2018 -  Chemotherapy    The patient had palonosetron (ALOXI) injection 0.25 mg, 0.25 mg, Intravenous,  Once, 0 of 4 cycles riTUXimab (RITUXAN) 700 mg in sodium chloride 0.9 % 250 mL (2.1875 mg/mL) infusion, 375 mg/m2 = 700 mg, Intravenous,  Once, 0 of 4  cycles bendamustine (BENDEKA) 125 mg in sodium chloride 0.9 % 50 mL (2.2727 mg/mL) chemo infusion, 70 mg/m2 = 125 mg (100 % of original dose 70 mg/m2), Intravenous,  Once, 0 of 4 cycles Dose modification: 70 mg/m2 (original dose 70 mg/m2, Cycle 1, Reason: Patient Age, Comment: Advance as tolerated)  for chemotherapy treatment.      Subjective Data:   Subjective:    Sydnee Lamour is a 77 y.o. female who presents for evaluation of left sided pain and burning sensation below ribs laterally . The patient has had previous visit to [facility] approximately 2 months ago thought to for similar complaint . Symptoms have been present for 1  week and are gradually worsening.  Onset was related to / precipitated by no known injury. The pain is located below left side of ribs laterally and does not radiate. The pain is described as aching and burning and occurs all day. She rates her pain as a 6/6 on a scale of 0-10. Symptoms are exacerbated by nothing in particular. Symptoms are improved by applying pressure or laying on left side. She has also tried NSAIDs which provided no symptom relief. She has no other symptoms associated with the back pain.  Evaluated in July 2019 for biopsy site related pain.  Had chest x-ray for confirmation of negative pneumothorax.  Biopsy site was clean and dry without signs of infection.  A small fluid-filled area under breast at 5:00 was identified.  It was painful to the touch.  Vital signs were stable.  Imaging reviewed and fluid-filled mass was thought to be inflammation/fluid surrounding the cancer itself.  It was unrelated to her recent biopsy and she was treated symptomatically with Tylenol and/or tramadol.  The following portions of the patient's history were reviewed and updated as appropriate: allergies, current medications, past family history, past medical history, past social history, past surgical history and problem list.  Review of Systems A comprehensive review of systems was negative except for: Musculoskeletal: positive for arthralgias, bone pain and Left-sided chest pain below ribs; small fluid-filled sac identified with palpation.  Tender to touch per patient.  No injury.    Objective:   Inspection and palpation: tenderness with palpation of fluid filled area directly below floating ribs on left side.    Assessment:    Neoplasm: related inflammation and possible fluid filled area from known malignancy.     Plan:      1.  Stage IV gray zone lymphoma: s/p mini R-CHOP from September 2017 completing in December 2017.  Had radiation to right iliac wing in March 2018 through April 2018.   Subsequent PET scan in July 2018 revealed progression with increasing nodules in the right and left lobes.  Biopsy revealed squamous cell lung cancer.  Completed SBRT to right and left upper lobes completing in November 2018.  Follow-up PET scan in February 2019 revealing mixed appearance with improvement in pulmonary nodules but with progressive lymphoma; Deauville 4 and 5.  Had right paraspinal lesion biopsied in March 2019 revealing previously diagnosed lymphoma.  She received 3 cycles of Brentuximab vedotin completing in June 2019.  Repeat PET scan in July 2019 revealed persistent and progressive findings of possibly lymphoma and/or lung cancer.  Subsequent biopsies revealed paraspinal right side positive for lymphoma and CT-guided LUL nodule biopsy revealed squamous cell lung cancer.  Completed SBRT to progressing lung nodules from 04/09/18-04/15/18.  Foundation one from lung biopsy revealed PDL 50%.  Had a restaging chest abdomen and pelvis on 05/15/2018  revealing enlarging mediastinal and right upper lobe nodule indicative of progression.  Bronchoscopy scheduled for 05/30/2018 for sampling of new nodule.  Plan is to treat with Bendamustine and Rituxan with the addition of pembrolizumab if lung cancer progresses.  She is scheduled to return hep C results and cycle 1 therapy ASAP.  2.  Left lower lateral chest pain: Constant 6/10 sharp pain.  Started approximately 1 week ago.  Better with the application of pressure. "  Feels like fluid".  Reviewed recent imaging (CT scans) from 05/14/2018.  Could not correlate area of tenderness with anything specific malignancy/abnormality from recent scans.  Reviewed with Dr. Grayland Ormond for confirmation.  This likely malignancy related and hopefully will resolve once treatment begins.  For now, symptom management with pain medications will be necessary.  Patient hopefully well have biopsy tomorrow and return to clinic early next week to begin treatment.  Patient and daughter  in agreement with plan.  Patient has not started gabapentin 100 mg at bedtime as prescribed at last visit.  States she read the side effects and is concerned about becoming more depressed and/or having suicidal ideations.  Educated the patient on common side effects which typically are CNS related with sedation and dizziness.  Encouraged her to try medication to see if this helps with peripheral neuropathy as well as lateral chest pain.  Patient to call if symptoms worsen or persist.  Greater than 50% was spent in counseling and coordination of care with this patient including but not limited to discussion of the relevant topics above (See A&P) including, but not limited to diagnosis and management of acute and chronic medical conditions.   Faythe Casa, NP 05/31/2018 11:45 AM

## 2018-05-30 ENCOUNTER — Ambulatory Visit
Admission: RE | Admit: 2018-05-30 | Discharge: 2018-05-30 | Disposition: A | Discharge status: Home / Self Care | Payer: Medicare HMO | Source: Ambulatory Visit | Attending: Pulmonary Disease | Admitting: Pulmonary Disease

## 2018-05-30 ENCOUNTER — Other Ambulatory Visit: Payer: Self-pay | Admitting: Pulmonary Disease

## 2018-05-30 ENCOUNTER — Ambulatory Visit: Payer: Medicare HMO | Admitting: Anesthesiology

## 2018-05-30 ENCOUNTER — Encounter: Payer: Self-pay | Admitting: Anesthesiology

## 2018-05-30 ENCOUNTER — Other Ambulatory Visit: Payer: Self-pay

## 2018-05-30 ENCOUNTER — Encounter
Admission: RE | Disposition: A | Discharge status: Home / Self Care | Payer: Self-pay | Source: Ambulatory Visit | Attending: Pulmonary Disease

## 2018-05-30 DIAGNOSIS — G620 Drug-induced polyneuropathy: Secondary | ICD-10-CM | POA: Diagnosis not present

## 2018-05-30 DIAGNOSIS — Z87891 Personal history of nicotine dependence: Secondary | ICD-10-CM | POA: Insufficient documentation

## 2018-05-30 DIAGNOSIS — M170 Bilateral primary osteoarthritis of knee: Secondary | ICD-10-CM | POA: Insufficient documentation

## 2018-05-30 DIAGNOSIS — Z923 Personal history of irradiation: Secondary | ICD-10-CM | POA: Diagnosis not present

## 2018-05-30 DIAGNOSIS — R590 Localized enlarged lymph nodes: Secondary | ICD-10-CM | POA: Diagnosis not present

## 2018-05-30 DIAGNOSIS — H905 Unspecified sensorineural hearing loss: Secondary | ICD-10-CM | POA: Insufficient documentation

## 2018-05-30 DIAGNOSIS — C3490 Malignant neoplasm of unspecified part of unspecified bronchus or lung: Secondary | ICD-10-CM | POA: Insufficient documentation

## 2018-05-30 DIAGNOSIS — C771 Secondary and unspecified malignant neoplasm of intrathoracic lymph nodes: Secondary | ICD-10-CM | POA: Diagnosis not present

## 2018-05-30 DIAGNOSIS — C8590 Non-Hodgkin lymphoma, unspecified, unspecified site: Secondary | ICD-10-CM | POA: Insufficient documentation

## 2018-05-30 HISTORY — PX: ENDOBRONCHIAL ULTRASOUND: SHX5096

## 2018-05-30 SURGERY — ENDOBRONCHIAL ULTRASOUND (EBUS)
Anesthesia: General

## 2018-05-30 MED ORDER — ROCURONIUM BROMIDE 50 MG/5ML IV SOLN
INTRAVENOUS | Status: AC
  Filled 2018-05-30: qty 1

## 2018-05-30 MED ORDER — PROPOFOL 10 MG/ML IV BOLUS
INTRAVENOUS | Status: AC
  Filled 2018-05-30: qty 20

## 2018-05-30 MED ORDER — PROPOFOL 10 MG/ML IV BOLUS
INTRAVENOUS | Status: DC | PRN
  Administered 2018-05-30: 100 mg via INTRAVENOUS

## 2018-05-30 MED ORDER — FENTANYL CITRATE (PF) 100 MCG/2ML IJ SOLN
INTRAMUSCULAR | Status: DC | PRN
  Administered 2018-05-30: 50 ug via INTRAVENOUS

## 2018-05-30 MED ORDER — ONDANSETRON HCL 4 MG/2ML IJ SOLN
INTRAMUSCULAR | Status: AC
  Filled 2018-05-30: qty 2

## 2018-05-30 MED ORDER — ONDANSETRON HCL 4 MG/2ML IJ SOLN: 4.0000 mg | Freq: Once | INTRAMUSCULAR | Status: DC | PRN

## 2018-05-30 MED ORDER — FENTANYL CITRATE (PF) 100 MCG/2ML IJ SOLN
INTRAMUSCULAR | Status: AC
  Filled 2018-05-30: qty 2

## 2018-05-30 MED ORDER — ROCURONIUM BROMIDE 100 MG/10ML IV SOLN
INTRAVENOUS | Status: DC | PRN
  Administered 2018-05-30: 10 mg via INTRAVENOUS
  Administered 2018-05-30: 5 mg via INTRAVENOUS

## 2018-05-30 MED ORDER — DEXAMETHASONE SODIUM PHOSPHATE 10 MG/ML IJ SOLN
INTRAMUSCULAR | Status: AC
  Filled 2018-05-30: qty 1

## 2018-05-30 MED ORDER — BUTAMBEN-TETRACAINE-BENZOCAINE 2-2-14 % EX AERO
1.0000 | INHALATION_SPRAY | Freq: Once | CUTANEOUS | Status: DC
  Filled 2018-05-30: qty 20

## 2018-05-30 MED ORDER — FENTANYL CITRATE (PF) 100 MCG/2ML IJ SOLN: 25.0000 ug | INTRAMUSCULAR | Status: DC | PRN

## 2018-05-30 MED ORDER — MIDAZOLAM HCL 5 MG/5ML IJ SOLN
INTRAMUSCULAR | Status: DC | PRN
  Administered 2018-05-30: 2 mg via INTRAVENOUS

## 2018-05-30 MED ORDER — SUGAMMADEX SODIUM 200 MG/2ML IV SOLN
INTRAVENOUS | Status: DC | PRN
  Administered 2018-05-30: 150 mg via INTRAVENOUS

## 2018-05-30 MED ORDER — SODIUM CHLORIDE 0.9 % IV SOLN
Freq: Once | INTRAVENOUS | Status: AC
  Administered 2018-05-30: 13:00:00 via INTRAVENOUS

## 2018-05-30 MED ORDER — PHENYLEPHRINE HCL 10 MG/ML IJ SOLN
INTRAMUSCULAR | Status: DC | PRN
  Administered 2018-05-30: 50 ug via INTRAVENOUS

## 2018-05-30 MED ORDER — LIDOCAINE HCL (CARDIAC) PF 100 MG/5ML IV SOSY
PREFILLED_SYRINGE | INTRAVENOUS | Status: DC | PRN
  Administered 2018-05-30: 60 mg via INTRAVENOUS

## 2018-05-30 MED ORDER — SUGAMMADEX SODIUM 200 MG/2ML IV SOLN
INTRAVENOUS | Status: AC
  Filled 2018-05-30: qty 2

## 2018-05-30 MED ORDER — DEXAMETHASONE SODIUM PHOSPHATE 10 MG/ML IJ SOLN
INTRAMUSCULAR | Status: DC | PRN
  Administered 2018-05-30: 10 mg via INTRAVENOUS

## 2018-05-30 MED ORDER — ONDANSETRON HCL 4 MG/2ML IJ SOLN
INTRAMUSCULAR | Status: DC | PRN
  Administered 2018-05-30: 4 mg via INTRAVENOUS

## 2018-05-30 MED ORDER — SUCCINYLCHOLINE CHLORIDE 20 MG/ML IJ SOLN
INTRAMUSCULAR | Status: AC
  Filled 2018-05-30: qty 1

## 2018-05-30 MED ORDER — SODIUM CHLORIDE 0.9 % IV SOLN
INTRAVENOUS | Status: DC | PRN
  Administered 2018-05-30: 12:00:00 via INTRAVENOUS

## 2018-05-30 MED ORDER — MIDAZOLAM HCL 2 MG/2ML IJ SOLN
INTRAMUSCULAR | Status: AC
  Filled 2018-05-30: qty 2

## 2018-05-30 MED ORDER — SUCCINYLCHOLINE CHLORIDE 20 MG/ML IJ SOLN
INTRAMUSCULAR | Status: DC | PRN
  Administered 2018-05-30: 100 mg via INTRAVENOUS

## 2018-05-30 NOTE — Op Note (Signed)
PROCEDURE:  1) BRONCHOSCOPY 2) ENDOBRONCHIAL ULTRASOUND   PROCEDURE DATE: 05/30/2018  TIME:  NAME:  Toni Griffith  DOB:[birth year]  MRN: [id number] LOC:  ARPO/None    HOSP DAY: Outpt CODE STATUS: Full   Code Status History    Date Active Date Inactive Code Status Order ID Comments User Context   02/01/2018 1319 02/03/2018 1716 Full Code [id number]  [name], MD Inpatient    Advance Directive Documentation     Most Recent Value  Type of Advance Directive  Healthcare Power of Attorney  Pre-existing out of facility DNR order (yellow form or pink MOST form)  -  "MOST" Form in Place?  -          Indications/Preliminary Diagnosis: 1) Pre carinal adenopathy 2) Squamous cell CA lung 3) Non Hodgkin's Lymphoma  Consent: (Place X beside choice/s below)  The benefits, risks and possible complications of the procedure were        explained to:  ___ patient  _X__ patient's family  ___ other:___________  who verbalized understanding and gave:  ___ verbal  ___ written  _X__ verbal and written  ___ telephone  ___ other:________ consent.      Unable to obtain consent; procedure performed on emergent basis.   X  Other: patient has congenital deafness, an interpreter for sign language was used to obtain consent.       PRESEDATION ASSESSMENT: History and Physical has been performed. Patient meds and allergies have been reviewed. Presedation airway examination has been performed and documented. Baseline vital signs, sedation score, oxygenation status, and cardiac rhythm were reviewed. Patient was deemed to be in satisfactory condition to undergo the procedure.    PREMEDICATIONS: SEE ANESTHESIOLOGY RECORDS   Insertion Route (Place X beside choice below)   Nasal   Oral  X Endotracheal Tube   Tracheostomy   INTRAPROCEDURE MEDICATIONS: SEE ANESTHESIOLOGY RECORDS   PROCEDURE DETAILS: Timeout performed and correct patient, name, & ID confirmed. The patient was inducted on  under general anesthesia by anesthesiologist and CRNA. She was intubated with an 8.5 ETT without difficulty. A Portex adapter was placed on the ETT. Utilizing the Portex adapter the Olympus video therapeutic bronchoscope was advanced into the airway. The visible parts of the trachea were normal. Carina was sharp. The right tracheobronchial tree was inspected and no endobronchial lesions were noted. 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 There was no inflammation or excessive airway secretions. In similar fashion the left lung was then examined and no endobronchial lesions could be noted. Again the mucosa was normal appearing and there were no excessive secretions. At this point the bronchoscope was exchanged for an Olympus endobronchial ultrasound scope. At this point the precarinal, sub coronal and hilar areas were examined with the ultrasound. The adenopathy noted on CT in the precarinal location was identified by ultrasound. There was no significant adenopathy in any of the other stations. The precarinal node that was larger (2 cm in diameter) was chosen as it was the most reliable target. There was some central necrosis noted. At this point using a 21gauge Olympus EBUS needle the precarinal node was sampled. Initial pass showed no malignant cells however,on the second pass malignant cells were identified. Additional passes were made and placed on the appropriate cytology preservative. Total passes were six. At this point the airway was examined and adequate hemostasis was noted. Only minimal heme at the puncture site was noted. The patient received a  total of 9 mL's of lidocaine 1% via bronchial installation prior to retrieving the scope. Bronchoscope was retrieved on the procedure was terminated. The patient was allowed to emerge from general anesthesia and was extubated in the procedure room without sequela. She was transferred to the PACU in  satisfactory condition. No immediate complications were noted.    SPECIMENS (Sites): (Place X beside choice below)  Specimens Description   No Specimens Obtained     Washings    Lavage    Biopsies   X Fine Needle Aspirates TBNA precarinal node X 6   Brushings    Sputum    FINDINGS:  1) Precarinal adenopathy, malignant, await cell type.   ESTIMATED BLOOD LOSS: none COMPLICATIONS/RESOLUTION: none    IMPRESSION:POST-PROCEDURE DX:   1) Mediastinal adenopathy, likely metastatic, await cell type.    RECOMMENDATION/PLAN:  Follow up Pathology Reports The patient will be notified of follow-up appointment.     Renold Don, M.D.  [facility]  Advanced Bronchoscopy

## 2018-05-30 NOTE — Discharge Instructions (Signed)
° °  Flexible Bronchoscopy, Care After These instructions give you information on caring for yourself after your procedure. Your doctor may also give you more specific instructions. Call your doctor if you have any problems or questions after your procedure. Follow these instructions at home:  Do not eat or drink anything for 2 hours after your procedure. If you try to eat or drink before the medicine wears off, food or drink could go into your lungs. You could also burn yourself.  After 2 hours have passed and when you can cough and gag normally, you may eat soft food and drink liquids slowly.  The day after the test, you may eat your normal diet.  You may do your normal activities.  Keep all doctor visits. Get help right away if:  You get more and more short of breath.  You get light-headed.  You feel like you are going to pass out (faint).  You have chest pain.  You have new problems that worry you.  You cough up more than a little blood.  You cough up more blood than before. This information is not intended to replace advice given to you by your health care provider. Make sure you discuss any questions you have with your health care provider. Document Released: 05/14/2009 Document Revised: 12/23/2015 Document Reviewed: 03/21/2013 Elsevier Interactive Patient Education  2017 [place]   1) The drugs that you were given will stay in your system until tomorrow so for the next 24 hours you should not:  A) Drive an automobile B) Make any legal decisions C) Drink any alcoholic beverage   2) You may resume regular meals tomorrow.  Today it is better to start with liquids and gradually work up to solid foods.  You may eat anything you prefer, but it is better to start with liquids, then soup and crackers, and gradually work up to solid foods.   3) Please notify your doctor immediately if you have any unusual bleeding,  trouble breathing, redness and pain at the surgery site, drainage, fever, or pain not relieved by medication.    4) Additional Instructions:        Please contact your physician with any problems or Same Day Surgery at [phone], Monday through Friday 6 am to 4 pm, or [place] at [facility] number at [phone].

## 2018-05-30 NOTE — Anesthesia Post-op Follow-up Note (Signed)
Anesthesia QCDR form completed.        

## 2018-05-30 NOTE — Anesthesia Preprocedure Evaluation (Addendum)
Anesthesia Evaluation  Patient identified by MRN, date of birth, ID band Patient awake    Reviewed: Allergy & Precautions, NPO status , Patient's Chart, lab work & pertinent test results  Airway Mallampati: III  TM Distance: <3 FB     Dental   Pulmonary former smoker,    Pulmonary exam normal        Cardiovascular hypertension, + CAD, + CABG and + Peripheral Vascular Disease  Normal cardiovascular exam     Neuro/Psych  Neuromuscular disease negative psych ROS   GI/Hepatic GERD  ,  Endo/Other    Renal/GU      Musculoskeletal  (+) Arthritis ,   Abdominal Normal abdominal exam  (+)   Peds negative pediatric ROS (+)  Hematology  (+) anemia ,   Anesthesia Other Findings   Reproductive/Obstetrics                            Anesthesia Physical Anesthesia Plan  ASA: III  Anesthesia Plan: General   Post-op Pain Management:    Induction: Intravenous  PONV Risk Score and Plan:   Airway Management Planned: Oral ETT  Additional Equipment:   Intra-op Plan:   Post-operative Plan: Extubation in OR  Informed Consent: I have reviewed the patients History and Physical, chart, labs and discussed the procedure including the risks, benefits and alternatives for the proposed anesthesia with the patient or authorized representative who has indicated his/her understanding and acceptance.   Dental advisory given  Plan Discussed with: CRNA and Surgeon  Anesthesia Plan Comments:         Anesthesia Quick Evaluation

## 2018-05-30 NOTE — Interval H&P Note (Signed)
History and Physical Interval Note:  05/30/2018 12:55 PM  Toni Griffith  has presented today for surgery, with the diagnosis of MEDIASTINAL ADENOPATHY  The various methods of treatment have been discussed with the patient and family. After consideration of risks, benefits and other options for treatment, the patient has consented to  Procedure(s): ENDOBRONCHIAL ULTRASOUND (N/A) as a surgical intervention .  The patient's history has been reviewed, patient examined, no change in status, stable for surgery.  I have reviewed the patient's chart and labs.  Questions were answered to the patient's satisfaction.     Vernard Gambles

## 2018-05-30 NOTE — Transfer of Care (Signed)
Immediate Anesthesia Transfer of Care Note  Patient: Toni Griffith  Procedure(s) Performed: ENDOBRONCHIAL ULTRASOUND (N/A )  Patient Location: PACU  Anesthesia Type:General  Level of Consciousness: sedated  Airway & Oxygen Therapy: Patient Spontanous Breathing and Patient connected to face mask oxygen  Post-op Assessment: Report given to RN and Post -op Vital signs reviewed and stable  Post vital signs: Reviewed and stable  Last Vitals:  Vitals Value Taken Time  BP 134/67 05/30/2018  1:57 PM  Temp 36.8 C 05/30/2018  1:57 PM  Pulse 70 05/30/2018  2:01 PM  Resp 20 05/30/2018  2:01 PM  SpO2 100 % 05/30/2018  2:01 PM  Vitals shown include unvalidated device data.  Last Pain:  Vitals:   05/30/18 1357  TempSrc:   PainSc: Asleep         Complications: No apparent anesthesia complications

## 2018-05-30 NOTE — Anesthesia Procedure Notes (Signed)
Procedure Name: Intubation Date/Time: 05/30/2018 1:15 PM Performed by: Dionne Bucy, CRNA Pre-anesthesia Checklist: Patient identified, Patient being monitored, Timeout performed, Emergency Drugs available and Suction available Patient Re-evaluated:Patient Re-evaluated prior to induction Oxygen Delivery Method: Circle system utilized Preoxygenation: Pre-oxygenation with 100% oxygen Induction Type: IV induction Ventilation: Mask ventilation without difficulty Laryngoscope Size: Mac and 3 Grade View: Grade II Tube type: Oral Tube size: 8.5 mm Number of attempts: 1 Airway Equipment and Method: Stylet Placement Confirmation: ETT inserted through vocal cords under direct vision,  positive ETCO2 and breath sounds checked- equal and bilateral Secured at: 21 cm Tube secured with: Tape Dental Injury: Teeth and Oropharynx as per pre-operative assessment

## 2018-05-31 LAB — CYTOLOGY - NON PAP

## 2018-06-01 NOTE — Anesthesia Postprocedure Evaluation (Signed)
Anesthesia Post Note  Patient: Toni Griffith  Procedure(s) Performed: ENDOBRONCHIAL ULTRASOUND (N/A )  Patient location during evaluation: PACU Anesthesia Type: General Level of consciousness: awake and alert and oriented Pain management: pain level controlled Vital Signs Assessment: post-procedure vital signs reviewed and stable Respiratory status: spontaneous breathing Cardiovascular status: blood pressure returned to baseline Anesthetic complications: no     Last Vitals:  Vitals:   05/30/18 1434 05/30/18 1528  BP: (!) 154/91 (!) 155/89  Pulse: 87 93  Resp: 16 16  Temp: 36.6 C   SpO2: 92% 93%    Last Pain:  Vitals:   05/31/18 0842  TempSrc:   PainSc: 0-No pain                 Adea Geisel

## 2018-06-03 LAB — PATHOLOGY

## 2018-06-06 ENCOUNTER — Telehealth: Payer: Self-pay | Admitting: *Deleted

## 2018-06-06 NOTE — Telephone Encounter (Addendum)
Daughter [name] called and asks what are the next steps, they received biopsy results from Dr Patsey Berthold  Component 7d ago  . Comment   Comment: Material submitted:                    Marland Kitchen  Lymph node, precarinal; FNA   . Comment   Comment: Clinician provided ICD-10:  C77.1 - Secondary and unsp malignant neoplasm of intrathorac   . Comment   Comment: Pre-operative diagnosis:                    .  R/O metastasis (suspect squamous cell carcinoma)   . Comment   Comment: Post-operative diagnosis:                    .  None provided.   . Comment   Comment: Clinical history:                     .  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 Comment   Comment:  Diagnosis:  A. LYMPH NODE, PRECARINAL; EBUS FNA:  - POSITIVE FOR MALIGNANCY.  - METASTATIC NON-SMALL CELL CARCINOMA, FAVOR SQUAMOUS CELL CARCINOMA.  Comment:  The cell block is essentially acellular and insufficient for ancillary  testing.  The previous history is noted.  Slides reviewed: 1 Diff-Quik stained slide, 1 Pap stained slide, 1  ThinPrep, and 1 cell block  MSO 05/31/2018 1506 Local   . Comment:   Comment: CASE: [id number]  PATIENT: Toni Griffith  Final Diagnosis performed by Bryan Lemma, MD.  Electronically signed  05/31/2018 2:50:48PM  The electronic signature indicates that the named Attending Pathologist  has evaluated the specimen  Technical component performed at [facility], [address] [place],  [place] [address] Lab: [phone] Dir: Rush Farmer, MD, MMM Professional  component performed at [facility], [facility], [place], [place], [place] [address] Lab: [phone] Dir: Dellia Nims.  Reuel Derby, MD   . Comment   Comment: Electronically signed:                   Marland Kitchen  Vivia Ewing, MD, Pathologist   . Comment   Comment: Gross description:                      .  A. Site: Pre-carinal lymph node  Procedure: EBUS  Cytotechnologist: Micheline Rough and Shon Baton  Specimen(s) collected:  1 Diff-Quik stained slide  1 Pap stained slide  Specimen labeled: Pre-carinal node FNA            Volume: Not applicable            Description: Pink CytoLyt solution with multiple  wispy pink to tan fragments of tissue and material            Submitted for: ThinPrep and one cell block  /MSO 05/31/2018 1506 Local   . Comment   Comment: Pathologist provided ICD-10:  C77.1   . Comment   Comment: CPT                            .  [id number]   Resulting Agency [facility]    Narrative  Performed by: Maryan Puls  Performed at: [address] [place], [place] [id number] Lab Director: Rush Farmer MD, Phone: [phone] Performed at: [address]  [address] [place], [place] [id number] Lab Director: Darden Dates MD, Phone: [phone]    Specimen Collected: 05/30/18 13:45  Last Resulted: 06/03/18 16:35

## 2018-06-07 ENCOUNTER — Inpatient Hospital Stay: Payer: Medicare HMO | Attending: Hematology and Oncology | Admitting: Hematology and Oncology

## 2018-06-07 VITALS — BP 129/84 | HR 98 | Temp 98.3°F | Resp 18 | Wt 163.2 lb

## 2018-06-07 DIAGNOSIS — C3411 Malignant neoplasm of upper lobe, right bronchus or lung: Secondary | ICD-10-CM | POA: Insufficient documentation

## 2018-06-07 DIAGNOSIS — C782 Secondary malignant neoplasm of pleura: Secondary | ICD-10-CM

## 2018-06-07 DIAGNOSIS — C8518 Unspecified B-cell lymphoma, lymph nodes of multiple sites: Secondary | ICD-10-CM | POA: Insufficient documentation

## 2018-06-07 DIAGNOSIS — Z7982 Long term (current) use of aspirin: Secondary | ICD-10-CM | POA: Insufficient documentation

## 2018-06-07 DIAGNOSIS — Z87891 Personal history of nicotine dependence: Secondary | ICD-10-CM | POA: Insufficient documentation

## 2018-06-07 DIAGNOSIS — D649 Anemia, unspecified: Secondary | ICD-10-CM | POA: Insufficient documentation

## 2018-06-07 DIAGNOSIS — G629 Polyneuropathy, unspecified: Secondary | ICD-10-CM | POA: Diagnosis not present

## 2018-06-07 DIAGNOSIS — C3432 Malignant neoplasm of lower lobe, left bronchus or lung: Secondary | ICD-10-CM | POA: Insufficient documentation

## 2018-06-07 DIAGNOSIS — C8330 Diffuse large B-cell lymphoma, unspecified site: Secondary | ICD-10-CM | POA: Diagnosis not present

## 2018-06-07 DIAGNOSIS — R590 Localized enlarged lymph nodes: Secondary | ICD-10-CM

## 2018-06-07 DIAGNOSIS — C3492 Malignant neoplasm of unspecified part of left bronchus or lung: Secondary | ICD-10-CM

## 2018-06-07 DIAGNOSIS — Z7189 Other specified counseling: Secondary | ICD-10-CM

## 2018-06-07 DIAGNOSIS — C771 Secondary and unspecified malignant neoplasm of intrathoracic lymph nodes: Secondary | ICD-10-CM | POA: Insufficient documentation

## 2018-06-07 DIAGNOSIS — H9190 Unspecified hearing loss, unspecified ear: Secondary | ICD-10-CM | POA: Insufficient documentation

## 2018-06-07 DIAGNOSIS — Z5112 Encounter for antineoplastic immunotherapy: Secondary | ICD-10-CM | POA: Insufficient documentation

## 2018-06-07 DIAGNOSIS — Z79899 Other long term (current) drug therapy: Secondary | ICD-10-CM | POA: Insufficient documentation

## 2018-06-07 DIAGNOSIS — C7951 Secondary malignant neoplasm of bone: Secondary | ICD-10-CM

## 2018-06-07 DIAGNOSIS — C3490 Malignant neoplasm of unspecified part of unspecified bronchus or lung: Secondary | ICD-10-CM

## 2018-06-07 NOTE — Progress Notes (Signed)
Patient here today to discuss plan.

## 2018-06-07 NOTE — Progress Notes (Addendum)
ALERT: A disease instance has been permanently removed from this patient's pathway record and replaced with a new disease instance. Information on the new disease instance will be transmitted in a separate message.  Disease Being Removed: Lymphoma and CLL  Reason for Removal: Reason not listed 

## 2018-06-07 NOTE — Progress Notes (Signed)
[place] [facility] day:  06/07/18  Chief Complaint: Toni Griffith is a 77 y.o. female with stage IVB gray zone lymphoma who is seen for review of interval bronchoscopy and discussion regarding direction of therapy.  HPI:  The patient was last seen in the medical oncology clinic on 05/16/2018.  At that time, scan revealed enlarging mediastinal and right upper lobe nodule, indicative of disease progression.  Decision was made to postpone chemotherapy (bendamustine and Rituxan) and pursue mediastinal lymph node biopsy given her history of lymphoma and squamous cell lung cancer.  She underwent bronchoscopy by Dr. Patsey Berthold on 05/30/2018.  There were no endobronchial lesions.  Via endoscopic ultrasound the pre-carinal lymph node was 2 cm and chosen to be the target.  Six biopsies were taken.  Pathology revealed metastatic non-small cell carcinoma, favor squamous cell carcinoma.    During the interim, she has felt "fine".  She states that her breathing is good.  She is eating fine.  She comments that her left side bothers her "at times" when she sleeps and rolls on her side.  She denies any B symptoms.   Past Medical History:  Diagnosis Date  . Arthritis   . Cancer [facility])    lymphoma-right hip  . Deaf   . GERD (gastroesophageal reflux disease)   . Hypertension    NOT ON MED AT PRESENT    Past Surgical History:  Procedure Laterality Date  . CARDIAC SURGERY  1993  . CORONARY ARTERY BYPASS GRAFT     DOUBLE  . CYST REMOVAL HAND Right   . ENDOBRONCHIAL ULTRASOUND N/A 05/30/2018   Procedure: ENDOBRONCHIAL ULTRASOUND;  Surgeon: Tyler Pita, MD;  Location: [facility] ORS;  Service: Cardiopulmonary;  Laterality: N/A;  . PERIPHERAL VASCULAR CATHETERIZATION N/A 04/06/2016   Procedure: Glori Luis Cath Insertion;  Surgeon: Algernon Huxley, MD;  Location: [facility];  Service: Cardiovascular;  Laterality: N/A;    Family History  Problem Relation Age of  Onset  . Lung cancer Mother   . Lung cancer Father   . Diabetes Maternal Uncle   . Myasthenia gravis Maternal Grandmother   . Leukemia Maternal Grandfather     Social History:  reports that she quit smoking about 2 years ago. Her smoking use included cigarettes. She has a 80.00 pack-year smoking history. She has never used smokeless tobacco. She reports current alcohol use. She reports that she does not use drugs.  She smoked 1 1/2 packs per day for 20 years (30 pack year smoking history) until 10/2015.  She is hearing impaired (deaf).  She lives in [place] with her boyfriend.  Her daughter lives in [place].  Her daughter, [name]'s contact number is [phone].  She is living with her son.  The patient is accompanied her daughter, son, boyfriend, and the interpreter.  Allergies: No Known Allergies  Current Medications: Current Outpatient Medications  Medication Sig Dispense Refill  . acetaminophen (TYLENOL) 500 MG tablet Take 500 mg by mouth every 6 (six) hours as needed for moderate pain.     Marland Kitchen allopurinol (ZYLOPRIM) 300 MG tablet TAKE 1 TABLET BY MOUTH EVERY DAY (Patient taking differently: Take 300 mg by mouth daily. ) 90 tablet 1  . aspirin EC 81 MG tablet Take 81 mg by mouth daily.    Marland Kitchen atorvastatin (LIPITOR) 80 MG tablet Take 80 mg by mouth every evening.     Marland Kitchen b complex vitamins tablet Take 1 tablet by mouth daily.    Marland Kitchen  Cholecalciferol (VITAMIN D3) 2000 units capsule Take 2,000 Units by mouth daily.    . diclofenac sodium (VOLTAREN) 1 % GEL Apply 2 g topically 3 (three) times daily as needed (left knee). (Patient taking differently: Apply 2 g topically 2 (two) times daily. ) 1 Tube 1  . FLUoxetine (PROZAC) 40 MG capsule Take 40 mg by mouth daily.     Marland Kitchen gabapentin (NEURONTIN) 100 MG capsule Take 2 capsules (200 mg total) by mouth at bedtime. 60 capsule 3  . lidocaine-prilocaine (EMLA) cream Apply cream 1 hour before chemotherapy treatment and place small peive of saran wrap over  cream to protect clothing 30 g 3  . Magnesium 250 MG TABS Take 1 tablet by mouth daily.    . pantoprazole (PROTONIX) 40 MG tablet Take 40 mg by mouth daily.     . vitamin B-12 (CYANOCOBALAMIN) 500 MCG tablet Take 500 mcg by mouth daily.    . vitamin E 400 UNIT capsule Take 400 Units by mouth daily.     Marland Kitchen HYDROcodone-acetaminophen (NORCO) 5-325 MG tablet Take 1 tablet by mouth every 6 (six) hours as needed for moderate pain. 30 tablet 0  . ondansetron (ZOFRAN ODT) 4 MG disintegrating tablet Take 1 tablet (4 mg total) by mouth every 8 (eight) hours as needed for nausea or vomiting. 30 tablet 1   No current facility-administered medications for this visit.    Facility-Administered Medications Ordered in Other Visits  Medication Dose Route Frequency Provider Last Rate Last Dose  . heparin lock flush 100 unit/mL  500 Units Intravenous Once ,  C, MD      . sodium chloride flush (NS) 0.9 % injection 10 mL  10 mL Intravenous PRN Nolon Stalls C, MD   10 mL at 04/05/17 1034    Review of Systems  Constitutional: Positive for weight loss (down 2 pounds). Negative for chills, diaphoresis, fever and malaise/fatigue.       Doing "fine".  HENT: Positive for hearing loss (deaf). Negative for congestion, nosebleeds, sinus pain and sore throat.   Eyes: Negative.  Negative for double vision, photophobia, pain, discharge and redness.  Respiratory: Negative.  Negative for cough, hemoptysis, sputum production and shortness of breath.   Cardiovascular: Negative.  Negative for chest pain, palpitations, orthopnea, leg swelling and PND.  Gastrointestinal: Negative for abdominal pain, blood in stool, constipation, diarrhea, heartburn, melena, nausea and vomiting.  Genitourinary: Negative.  Negative for dysuria, frequency, hematuria and urgency.  Musculoskeletal: Negative for back pain, falls, joint pain, myalgias and neck pain.       Left side hurts- see HPI.  Skin: Negative.  Negative for itching  and rash.  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 Negative for dizziness, tremors, focal weakness, weakness and headaches.  Endo/Heme/Allergies: Does not bruise/bleed easily.  Psychiatric/Behavioral: Negative for depression, memory loss and suicidal ideas. The patient is not nervous/anxious and does not have insomnia.   All other systems reviewed and are negative.  Performance status (ECOG): 2 - Symptomatic, <50% confined to bed  Vital Signs BP 129/84 (BP Location: Left Arm, Patient Position: Sitting)   Pulse 98   Temp 98.3 F (36.8 C) (Tympanic)   Resp 18   Wt 163 lb 3 oz (74 kg)   BMI 31.87 kg/m   Physical Exam  Constitutional: She is oriented to person, place, and time and well-developed, well-nourished, and in no distress. No distress.  Sitting comfortably in a wheelchair in no acute distress.  HENT:  Head:  Normocephalic and atraumatic.  Short gray hair.  Eyes: Conjunctivae and EOM are normal.  Hazel eyes  Neurological: She is alert and oriented to person, place, and time.  Skin: She is not diaphoretic.  Psychiatric: Mood, affect and judgment normal.  Nursing note and vitals reviewed.   Admissions: 02/20/2016 - 02/26/2016:  [facility] in [facility]:  She had renal failure and hypercalcemia. She was treated with IVF, calcitonin and Zometa.The patient has a history of coronary artery disease s/p CABG in 1993.  She has no history of heart failure.  01/20/2018 - 01/23/2018:  [facility].  She presented with nausea, vomiting, diarrhea and generalized weakness. Work-up showed hypokalemia, hypomagnesemia, dehydration and UTI.  She was treated with IV fluids and empiric IV antibiotics. Potassium and magnesium were repleted. Urine culture showed E. coli.  She was discharged on Augmentin. She was switched from HCTZ to chorthalidone. 02/01/2018 - 02/03/2018:  [facility] with hyponatremia.   Radiology  studies: 02/20/2016:  Chest, abdomen and pelvic CT revealed a 3.7 cm subpleural mass within the left lower lobe, a 1.1 cm right lower lobe subpleural nodule , 9 mm RUL nodule, and 0.4 cm lingular nodule.  Between the left second, third, and fourth ribs there was 1.9 cm soft tissue mass. There was a 2.5 cm low-attenuation nodule in the left lobe of the thyroid.   There were prominent left axillary nodes (up to 1.0 cm).   There was no definite mediastinal adenopathy. There was a 5.5 x 4.6 cm cystic lesion in the left hepatic lobe.  There was a 3.1 cm nodule in the right adrenal gland. There was a 1.2 cm right retrocaval lymph node with additional prominent subcentimeter retroperitoneal nodes. There was 1.0 cm left common iliac node. There was a 6.2 x 5.2 cm soft tissue mass within the right ilium.  02/21/2016:  Head MRI revealed no evidence of malignancy.  02/21/2016:  Thyroid ultrasound revealed a 2.4 cm solid nodule in the inferior aspect of the left lobe 03/03/2016:  PET scan revealed a hypermetabolic left lower lobe mass, bilateral pleural and pulmonary parenchymal nodular metastasis, a large pleural lesion invading the anterior left chest wall.   There was activity in the superior left ocular orbit.  There was a large lesion centered in the right iliac wing with soft tissue extension.  There were hypermetabolic right external iliac lymph nodes 06/06/2016:  PET scan revealed significant interval response to therapy.  There was significant decrease in size and FDG uptake associated with left lower lobe lung lesion.  There was resolution of left-sided pleural base metastasis and left axillary and anterior mediastinal nodal metastasis.  There was resolution of previous hypermetabolic soft tissue metastasis/nodal metastasis posterior to the IVC.  There was marked improvement in hypermetabolic metastasis involving the right iliac wing.  There was persistent right upper lobe lung nodule which exhibits intense  radiotracer uptake which may represent a focus of metastatic disease or synchronous primary lung neoplasm. 06/01/2017:  PET scan revealed no new or progressive findings.  The 9 mm RUL pulmonary nodule remained hypermetabolic (SUV 16.1, previously 7.3) and no change in size.  The index lesion in the posterior left lower lobe was minimally smaller on CT imaging with very low level FDG uptake (SUV 1.6).  There was no change in the appearance at site of previous disease at the right iliac wing (SUV 1.9 compared to 2.6 previously). 01/29/2017:  PET scan revealed interval progression of hypermetabolic nodules in  the right upper (13 mm compared to 9 mm; SUV 10) and left lower lobes (26 mm compared to 22 mm; SUV 7.5).  There was new hypermetabolic focus of activity along the anterior left pleura although no underlying pleural or lung mass was evident. 09/18/2017:  PET scan revealed a mixed appearance, with marked improvement in the prior 2 pulmonary nodules; but with new Deauville 5 left common iliac lymph node enlargement; new Deauville 5 right paraspinal lesion; and new Deauville 4 left infrahilar nodal activity.  There was a focus of accentuated metabolic activity which had been present along the right lower submandibular gland but currently seemed to project over the subcutaneous tissues without CT correlate. Current maximum SUV 10.5, previously 9.7.  There was increased accentuated activity along the left lateral spurring at L2-3. While typically such activity is inflammatory due to spurring, this had increased significantly since the prior exam and merit surveillance. There was a new 8 by 5 mm right upper lobe pulmonary nodule, Deauville 2, which also merits surveillance. 01/29/2018:  PET scan revealed persistent and progressive areas of hypermetabolism within the right submandibular gland, lingula of the left lung, left common iliac nodal chain, and right lumbar paraspinal soft tissues. Findings were compatible  with Deauville criteria 4 and 5.  There was a new nodule is identified within the lingula which demonstrated intense uptake compatible with Deauville criteria 5.    Assessment:  Toni Griffith is a 77 y.o. female with stage IVB B-cell lymphoma, unclassifiable, with features intermediate between diffuse large B-cell lymphoma and classical Hodgkin's lymphoma ("gray zone" lymphoma).  She underwent right iliac wing bone/bone marrow biopsy on 03/08/2016.  PET scan on 03/03/2016 revealed a hypermetabolic left lower lobe mass, bilateral pleural and pulmonary parenchymal nodular metastasis, a large pleural lesion invading the anterior left chest wall.   There was activity in the superior left ocular orbit.  There was a large lesion centered in the right iliac wing with soft tissue extension.  There were hypermetabolic right external iliac lymph nodes  Echo on 04/04/2016 revealed an EF of 55-60%.  Hepatitis B and C testing was negative on 03/30/2016.   G6PD assay was normal.  She declined LP with MTX prophylaxis.  She received 6 cycles of mini-RCHOP (04/07/2016 - 07/21/2016).  She has had some transient fingertip numbness.  She received radiation to the right iliac wing,  from 10/11/2016 - 11/01/2016.  She received 3000 cGy over 3 weeks.  PET scan on 01/29/2017 revealed interval progression of hypermetabolic nodules in the right upper (13 mm compared to 9 mm; SUV 10) and left lower lobes (26 mm compared to 22 mm; SUV 7.5).  There was new hypermetabolic focus of activity along the anterior left pleura although no underlying pleural or lung mass was evident.  She has squamous cell lung cancer.  Foundation One on the lung biopsy on 03/15/2018 revealed MS-stable, tumor mutational burden 5 Muts/Mb, AKT2 amplification, CCNE1 amplification, CREBBP R1446L, KDR amplification, KIT amplification, PDGFRA amplification, PTEN loss exons 2-4, and NO03 splice site 704+8G>Q. Marland Kitchen  There were no alterations in EGFR, KRAS,  RET, ALK, MET, ERBB2, BRAF, ROS1.  PDL-1 IHC analysis revealed a TPS score of 50%.  CT guided left lower lobe nodule biopsy on 02/19/2017 confirmed squamous cell carcinoma.  She underwent SBRT 03/26/2017 - 04/09/2017.  She received 5000 cGy in 5 fractions to the left lower lobe lesion.  She underwent SBRT to a RUL lesion from 05/23/2017 - 06/18/2017.  CT guided left upper lobe  nodule biopsy on 02/20/2018 revealed squamous cell lung cancer.  She received SBRT from 04/09/2018 - 04/15/2018.   She received 6000 cGy in 5 fractions.    PET scan on 09/18/2017 revealed a mixed appearance, with marked improvement in the prior 2 pulmonary nodules; but with new Deauville 5 left common iliac lymph node enlargement; new Deauville 5 right paraspinal lesion; and new Deauville 4 left infrahilar nodal activity.  There was a focus of accentuated metabolic activity which had been present along the right lower submandibular gland but currently seemed to project over the subcutaneous tissues without CT correlate. Current maximum SUV 10.5, previously 9.7. There was increased accentuated activity along the left lateral spurring at L2-3. While typically such activity is inflammatory due to spurring, this had increased significantly since the prior exam and merit surveillance. There was a new 8 by 5 mm right upper lobe pulmonary nodule, Deauville 2.  CT guided right paraspinal lesion biopsy on 10/16/2017 was compatible with involvement by the patient's previously diagnosed lymphoma.  The case was referred to [facility] hematopathology.  The neoplastic cells were largely CD30 negative.  Repeat CD30 immunohistochemistry faintly stained > 10% of the large atypical cells. CD20 stained the scattered large atypical cells and few small cells.   She received 3 cycles of brentuximab vedotin (11/23/2017 - 01/11/2018).  PET scan on 01/29/2018 revealed persistent and progressive areas of hypermetabolism within the right submandibular gland,  lingula of the left lung, left common iliac nodal chain, and right lumbar paraspinal soft tissues. Findings were compatible with Deauville criteria 4 and 5.  There was a new nodule is identified within the lingula which demonstrated intense uptake compatible with Deauville criteria 5.   She has a normocytic anemia.  Labs on 04/17/2016 revealed an elevated ferritin (444), iron saturation (11%), low TIBC (240), B12 (2409), and folate (10.8).  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  B12 and folate were normal on 12/06/2017.  She is deaf and requires an interpreter.  Symptomatically, she denies any B symptoms or shortness of breath.  Exam is stable.  Plan: 1. Discuss results of bronchoscopy.  Mediastinal nodes represent squamous cell lung cancer. 2. Patient has active squamous cell lung cancer and gray zone lymphoma. 3. Pearline Cables zone lymphoma  Discuss active lymphoma.  Discuss choosing treatment with response rates for both tumor types.  Discuss response rates for gray zone lymphoma and pembrolizumab.  Side effects of treatment reviewed. 4. Squamous cell lung cancer  Discuss active squamous cell lung cancer.  Patient has stage III/IV disease (2 separate primaries with mediastinal involvement)  Discuss baseline head MRI.  PDL-1 IHC analysis revealed a TPS score of 50%.  Discuss pembrolizumab every 3 weeks. 5. Schedule head MRI. 6. RTC next Friday for MD assessment, labs (CBC with diff, CMP, TSH, LDH, uric acid, CEA), and +/- pembrolizumab (new).   A total of (> 25) minutes of face-to-face time was spent with the patient with greater than 50% of that time in counseling and care-coordination.    Lequita Asal, MD  06/07/18, 5:57 PM

## 2018-06-07 NOTE — Progress Notes (Signed)
START ON PATHWAY REGIMEN - Non-Small Cell Lung     A cycle is 21 days:     Pembrolizumab   **Always confirm dose/schedule in your pharmacy ordering system**  Patient Characteristics: Stage IV Metastatic, Squamous, PS = 0, 1, First Line, PD-L1 Expression Positive ? 50% (TPS) and Immunotherapy Candidate AJCC T Category: T4 Current Disease Status: Distant Metastases AJCC N Category: N2 AJCC M Category: M1a AJCC 8 Stage Grouping: IVA Histology: Squamous Cell Line of therapy: First Line PD-L1 Expression Status: PD-L1 Positive ? 50% (TPS) Performance Status: PS = 0, 1 Immunotherapy Candidate Status: Candidate for Immunotherapy Intent of Therapy: Non-Curative / Palliative Intent, Discussed with Patient

## 2018-06-14 ENCOUNTER — Other Ambulatory Visit: Payer: Self-pay

## 2018-06-14 ENCOUNTER — Inpatient Hospital Stay: Payer: Medicare HMO

## 2018-06-14 ENCOUNTER — Other Ambulatory Visit: Payer: Self-pay | Admitting: Hematology and Oncology

## 2018-06-14 ENCOUNTER — Inpatient Hospital Stay (HOSPITAL_BASED_OUTPATIENT_CLINIC_OR_DEPARTMENT_OTHER): Payer: Medicare HMO | Admitting: Hematology and Oncology

## 2018-06-14 VITALS — BP 113/68 | HR 89 | Temp 96.1°F | Resp 18 | Wt 165.6 lb

## 2018-06-14 DIAGNOSIS — C3432 Malignant neoplasm of lower lobe, left bronchus or lung: Secondary | ICD-10-CM

## 2018-06-14 DIAGNOSIS — Z79899 Other long term (current) drug therapy: Secondary | ICD-10-CM

## 2018-06-14 DIAGNOSIS — C8330 Diffuse large B-cell lymphoma, unspecified site: Secondary | ICD-10-CM

## 2018-06-14 DIAGNOSIS — Z7982 Long term (current) use of aspirin: Secondary | ICD-10-CM

## 2018-06-14 DIAGNOSIS — Z87891 Personal history of nicotine dependence: Secondary | ICD-10-CM

## 2018-06-14 DIAGNOSIS — C3490 Malignant neoplasm of unspecified part of unspecified bronchus or lung: Secondary | ICD-10-CM

## 2018-06-14 DIAGNOSIS — R17 Unspecified jaundice: Secondary | ICD-10-CM

## 2018-06-14 DIAGNOSIS — C8518 Unspecified B-cell lymphoma, lymph nodes of multiple sites: Secondary | ICD-10-CM

## 2018-06-14 DIAGNOSIS — G629 Polyneuropathy, unspecified: Secondary | ICD-10-CM

## 2018-06-14 DIAGNOSIS — H9190 Unspecified hearing loss, unspecified ear: Secondary | ICD-10-CM

## 2018-06-14 DIAGNOSIS — D649 Anemia, unspecified: Secondary | ICD-10-CM

## 2018-06-14 DIAGNOSIS — Z7189 Other specified counseling: Secondary | ICD-10-CM

## 2018-06-14 DIAGNOSIS — Z5112 Encounter for antineoplastic immunotherapy: Secondary | ICD-10-CM

## 2018-06-14 DIAGNOSIS — C3492 Malignant neoplasm of unspecified part of left bronchus or lung: Secondary | ICD-10-CM

## 2018-06-14 DIAGNOSIS — C782 Secondary malignant neoplasm of pleura: Secondary | ICD-10-CM | POA: Diagnosis not present

## 2018-06-14 LAB — COMPREHENSIVE METABOLIC PANEL
ALT: 17 U/L (ref 0–44)
AST: 23 U/L (ref 15–41)
Albumin: 3 g/dL — ABNORMAL LOW (ref 3.5–5.0)
Alkaline Phosphatase: 68 U/L (ref 38–126)
Anion gap: 6 (ref 5–15)
BUN: 16 mg/dL (ref 8–23)
CO2: 24 mmol/L (ref 22–32)
Calcium: 8.7 mg/dL — ABNORMAL LOW (ref 8.9–10.3)
Chloride: 107 mmol/L (ref 98–111)
Creatinine, Ser: 0.89 mg/dL (ref 0.44–1.00)
GFR calc Af Amer: >60 mL/min (ref >60)
GFR calc non Af Amer: >60 mL/min (ref >60)
Glucose, Bld: 109 mg/dL — ABNORMAL HIGH (ref 70–99)
Potassium: 3.7 mmol/L (ref 3.5–5.1)
Sodium: 137 mmol/L (ref 135–145)
Total Bilirubin: 1.3 mg/dL — ABNORMAL HIGH (ref 0.3–1.2)
Total Protein: 6.8 g/dL (ref 6.5–8.1)

## 2018-06-14 LAB — CBC WITH DIFFERENTIAL/PLATELET
Abs Immature Granulocytes: 0.04 10*3/uL (ref 0.00–0.07)
Basophils Absolute: 0.1 10*3/uL (ref 0.0–0.1)
Basophils Relative: 1 %
Eosinophils Absolute: 0.3 10*3/uL (ref 0.0–0.5)
Eosinophils Relative: 4 %
HCT: 32.3 % — ABNORMAL LOW (ref 36.0–46.0)
Hemoglobin: 10.6 g/dL — ABNORMAL LOW (ref 12.0–15.0)
Immature Granulocytes: 1 %
Lymphocytes Relative: 13 %
Lymphs Abs: 1 10*3/uL (ref 0.7–4.0)
MCH: 28.2 pg (ref 26.0–34.0)
MCHC: 32.8 g/dL (ref 30.0–36.0)
MCV: 85.9 fL (ref 80.0–100.0)
Monocytes Absolute: 0.8 10*3/uL (ref 0.1–1.0)
Monocytes Relative: 11 %
Neutro Abs: 5.5 10*3/uL (ref 1.7–7.7)
Neutrophils Relative %: 70 %
Platelets: 383 10*3/uL (ref 150–400)
RBC: 3.76 MIL/uL — ABNORMAL LOW (ref 3.87–5.11)
RDW: 16.7 % — ABNORMAL HIGH (ref 11.5–15.5)
WBC: 7.7 10*3/uL (ref 4.0–10.5)
nRBC: 0 % (ref 0.0–0.2)

## 2018-06-14 LAB — TSH: TSH: 1.27 u[IU]/mL (ref 0.350–4.500)

## 2018-06-14 LAB — BILIRUBIN, DIRECT: Bilirubin, Direct: 0.2 mg/dL (ref 0.0–0.2)

## 2018-06-14 LAB — LACTATE DEHYDROGENASE: LDH: 135 U/L (ref 98–192)

## 2018-06-14 LAB — URIC ACID: Uric Acid, Serum: 3.1 mg/dL (ref 2.5–7.1)

## 2018-06-14 MED ORDER — HEPARIN SOD (PORK) LOCK FLUSH 100 UNIT/ML IV SOLN
500.0000 [IU] | Freq: Once | INTRAVENOUS | Status: AC
  Administered 2018-06-14: 500 [IU] via INTRAVENOUS
  Filled 2018-06-14: qty 5

## 2018-06-14 MED ORDER — SODIUM CHLORIDE 0.9 % IV SOLN
Freq: Once | INTRAVENOUS | Status: AC
  Administered 2018-06-14: 10:00:00 via INTRAVENOUS
  Filled 2018-06-14: qty 250

## 2018-06-14 MED ORDER — ONDANSETRON 4 MG PO TBDP: 4.0000 mg | ORAL_TABLET | Freq: Three times a day (TID) | ORAL | 1 refills | Status: DC | PRN

## 2018-06-14 MED ORDER — ONDANSETRON HCL 4 MG PO TABS
8.0000 mg | ORAL_TABLET | Freq: Once | ORAL | Status: AC
  Administered 2018-06-14: 8 mg via ORAL
  Filled 2018-06-14: qty 2

## 2018-06-14 MED ORDER — SODIUM CHLORIDE 0.9 % IV SOLN
200.0000 mg | Freq: Once | INTRAVENOUS | Status: AC
  Administered 2018-06-14: 200 mg via INTRAVENOUS
  Filled 2018-06-14: qty 8

## 2018-06-14 MED ORDER — SODIUM CHLORIDE 0.9 % IV SOLN: 2.0000 mg/kg | Freq: Once | INTRAVENOUS | Status: DC

## 2018-06-14 MED ORDER — SODIUM CHLORIDE 0.9% FLUSH
10.0000 mL | INTRAVENOUS | Status: DC | PRN
  Administered 2018-06-14: 10 mL via INTRAVENOUS
  Filled 2018-06-14: qty 10

## 2018-06-14 MED ORDER — HYDROCODONE-ACETAMINOPHEN 5-325 MG PO TABS: 1.0000 | ORAL_TABLET | Freq: Four times a day (QID) | ORAL | 0 refills | Status: DC | PRN

## 2018-06-14 NOTE — Progress Notes (Signed)
Here for follow up. Per pt and family pt fell 3 days ago in bathroom-falling into bath tub ( there for 5 h)  Boyfriend came home to find her- c/o buttocks pain-level 3 intermittent. Hydrocodone and zofran pended. Per pt ( w help w signing intereptor Craig Guess )-" doing fine"

## 2018-06-14 NOTE — Progress Notes (Signed)
[facility] day:  06/14/18  Chief Complaint: Toni Griffith is a 77 y.o. female with stage IVB gray zone lymphoma who is seen for assessment prior to cycle #1 pembrolizumab.  HPI:  The patient was last seen in the medical oncology clinic on 06/07/2018.  At that time, she denied any complaint. She denied any respiratory symptoms.  She denied any B symptoms.  We discussed results from her bronchoscopy.  Pathology confirmed squamous cell lung cancer.  PDL-1 was 50%.  Given her active gray zone lymphoma, discussion was held regarding initiation of pembrolizumab.  During the interim, patient is doing well overall. She sustained a mechanical fall over the weekend. Patient was getting up from toilet, felt dizzy, and fell backwards into the bathtub. Patient struck the back of her head. Patient unable to get up for 5 hours. Patient denies loss on consciousness. She notes some minor bruising to her occipital scalp.  Patient denies that she has experienced any B symptoms. She denies any interval infections. No nausea, vomiting, or changes to her bowel habits. Patient denies any acute physical concerns. Pain is well controlled.   Patient advises that she maintains an adequate appetite. She is eating well. Weight today is 165 lb 9.6 oz (75.1 kg), which compared to her last visit to the clinic, represents a 2 pound increase.    Patient complains of pain rated 3/10 in the clinic today.   Past Medical History:  Diagnosis Date  . Arthritis   . Cancer [facility])    lymphoma-right hip  . Deaf   . GERD (gastroesophageal reflux disease)   . Hypertension    NOT ON MED AT PRESENT    Past Surgical History:  Procedure Laterality Date  . CARDIAC SURGERY  1993  . CORONARY ARTERY BYPASS GRAFT     DOUBLE  . CYST REMOVAL HAND Right   . ENDOBRONCHIAL ULTRASOUND N/A 05/30/2018   Procedure: ENDOBRONCHIAL ULTRASOUND;  Surgeon: Tyler Pita, MD;  Location: [facility]  ORS;  Service: Cardiopulmonary;  Laterality: N/A;  . PERIPHERAL VASCULAR CATHETERIZATION N/A 04/06/2016   Procedure: Glori Luis Cath Insertion;  Surgeon: Algernon Huxley, MD;  Location: [facility] LAB;  Service: Cardiovascular;  Laterality: N/A;    Family History  Problem Relation Age of Onset  . Lung cancer Mother   . Lung cancer Father   . Diabetes Maternal Uncle   . Myasthenia gravis Maternal Grandmother   . Leukemia Maternal Grandfather     Social History:  reports that she quit smoking about 2 years ago. Her smoking use included cigarettes. She has a 80.00 pack-year smoking history. She has never used smokeless tobacco. She reports that she drinks alcohol. She reports that she does not use drugs.  She smoked 1 1/2 packs per day for 20 years (30 pack year smoking history) until 10/2015.  She is hearing impaired (deaf).  She lives in [place] with her boyfriend.  Her daughter lives in [place].  Her daughter, [name]'s contact number is [phone].  She is living with her son.  The patient is accompanied her daughter, son, and the interpreter.  Allergies: No Known Allergies  Current Medications: Current Outpatient Medications  Medication Sig Dispense Refill  . allopurinol (ZYLOPRIM) 300 MG tablet TAKE 1 TABLET BY MOUTH EVERY DAY (Patient taking differently: Take 300 mg by mouth daily. ) 90 tablet 1  . aspirin EC 81 MG tablet Take 81 mg by mouth daily.    Marland Kitchen  b complex vitamins tablet Take 1 tablet by mouth daily.    . Cholecalciferol (VITAMIN D3) 2000 units capsule Take 2,000 Units by mouth daily.    . diclofenac sodium (VOLTAREN) 1 % GEL Apply 2 g topically 3 (three) times daily as needed (left knee). (Patient taking differently: Apply 2 g topically 2 (two) times daily. ) 1 Tube 1  . FLUoxetine (PROZAC) 40 MG capsule Take 40 mg by mouth daily.     Marland Kitchen gabapentin (NEURONTIN) 100 MG capsule Take 2 capsules (200 mg total) by mouth at bedtime. 60 capsule 3  . HYDROcodone-acetaminophen (NORCO)  5-325 MG tablet Take 1 tablet by mouth every 6 (six) hours as needed for moderate pain. 30 tablet 0  . lidocaine-prilocaine (EMLA) cream Apply cream 1 hour before chemotherapy treatment and place small peive of saran wrap over cream to protect clothing 30 g 3  . Magnesium 250 MG TABS Take 1 tablet by mouth daily.    . pantoprazole (PROTONIX) 40 MG tablet Take 40 mg by mouth daily.     . vitamin B-12 (CYANOCOBALAMIN) 500 MCG tablet Take 500 mcg by mouth daily.    . vitamin E 400 UNIT capsule Take 400 Units by mouth daily.     Marland Kitchen acetaminophen (TYLENOL) 500 MG tablet Take 500 mg by mouth every 6 (six) hours as needed for moderate pain.     Marland Kitchen atorvastatin (LIPITOR) 80 MG tablet Take 80 mg by mouth every evening.     . ondansetron (ZOFRAN ODT) 4 MG disintegrating tablet Take 1 tablet (4 mg total) by mouth every 8 (eight) hours as needed for nausea or vomiting. (Patient not taking: Reported on 06/14/2018) 20 tablet 0   No current facility-administered medications for this visit.    Facility-Administered Medications Ordered in Other Visits  Medication Dose Route Frequency Provider Last Rate Last Dose  . heparin lock flush 100 unit/mL  500 Units Intravenous Once Corcoran, Melissa C, MD      . heparin lock flush 100 unit/mL  500 Units Intravenous Once Corcoran, Melissa C, MD      . sodium chloride flush (NS) 0.9 % injection 10 mL  10 mL Intravenous PRN Nolon Stalls C, MD   10 mL at 04/05/17 1034  . sodium chloride flush (NS) 0.9 % injection 10 mL  10 mL Intravenous PRN Lequita Asal, MD   10 mL at 06/14/18 6712    Review of Systems  Constitutional: Negative.  Negative for chills, diaphoresis, fever, malaise/fatigue and weight loss (up 2 pounds).       Feels "fine".  HENT: Positive for hearing loss (deaf). Negative for congestion, nosebleeds, sinus pain and sore throat.   Eyes: Negative.  Negative for double vision, photophobia, pain and discharge.  Respiratory: Negative.  Negative for  cough, hemoptysis, sputum production and shortness of breath.   Cardiovascular: Negative.  Negative for chest pain, palpitations, leg swelling and PND.  Gastrointestinal: Negative.  Negative for abdominal pain, blood in stool, constipation, diarrhea, heartburn, melena, nausea and vomiting.  Genitourinary: Negative.  Negative for dysuria, frequency, hematuria and urgency.  Musculoskeletal: Positive for back pain (lower) and joint pain (LEFT knee). Negative for falls and myalgias.  Skin: Negative.  Negative for itching and rash.  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 Negative for dizziness, tremors, focal weakness, weakness and headaches.  Endo/Heme/Allergies: Does not bruise/bleed easily.  Psychiatric/Behavioral: Negative for depression, memory loss and suicidal ideas. The patient is not nervous/anxious and does not  have insomnia.   All other systems reviewed and are negative.  Performance status (ECOG): 2 - Symptomatic, <50% confined to bed  Vital Signs BP 113/68 (BP Location: Left Wrist, Patient Position: Sitting)   Pulse 89   Temp (!) 96.1 F (35.6 C) (Tympanic)   Resp 18   Wt 165 lb 9.6 oz (75.1 kg)   BMI 32.34 kg/m   Physical Exam  Constitutional: She is oriented to person, place, and time and well-developed, well-nourished, and in no distress. No distress.  HENT:  Head: Normocephalic and atraumatic.  Mouth/Throat: Oropharynx is clear and moist. No oropharyngeal exudate.  Short gray hair.  Dentures.  Eyes: Pupils are equal, round, and reactive to light. Conjunctivae and EOM are normal. No scleral icterus.  Hazel eyes  Neck: Normal range of motion. Neck supple. No JVD present.  Cardiovascular: Regular rhythm and normal heart sounds. Exam reveals no gallop and no friction rub.  No murmur heard. Pulmonary/Chest: Effort normal and breath sounds normal. No respiratory distress. She has no wheezes. She has no rales.  Abdominal: Soft. Bowel  sounds are normal. She exhibits no distension and no mass. There is no abdominal tenderness. There is no rebound and no guarding.  Musculoskeletal: Normal range of motion.        General: No tenderness or edema.  Lymphadenopathy:    She has no cervical adenopathy.    She has no axillary adenopathy.       Right: No supraclavicular adenopathy present.       Left: No supraclavicular adenopathy present.  Neurological: She is alert and oriented to person, place, and time.  Skin: Skin is warm and dry. No rash noted. She is not diaphoretic. No erythema. No pallor.  Psychiatric: Mood, affect and judgment normal.  Nursing note and vitals reviewed.   Results for orders placed or performed in visit on 06/14/18 (from the past 168 hour(s))  Uric acid   Collection Time: 06/14/18  8:57 AM  Result Value Ref Range   Uric Acid, Serum 3.1 2.5 - 7.1 mg/dL  TSH   Collection Time: 06/14/18  8:57 AM  Result Value Ref Range   TSH 1.270 0.350 - 4.500 uIU/mL  Lactate dehydrogenase   Collection Time: 06/14/18  8:57 AM  Result Value Ref Range   LDH 135 98 - 192 U/L  Comprehensive metabolic panel   Collection Time: 06/14/18  8:57 AM  Result Value Ref Range   Sodium 137 135 - 145 mmol/L   Potassium 3.7 3.5 - 5.1 mmol/L   Chloride 107 98 - 111 mmol/L   CO2 24 22 - 32 mmol/L   Glucose, Bld 109 (H) 70 - 99 mg/dL   BUN 16 8 - 23 mg/dL   Creatinine, Ser 0.89 0.44 - 1.00 mg/dL   Calcium 8.7 (L) 8.9 - 10.3 mg/dL   Total Protein 6.8 6.5 - 8.1 g/dL   Albumin 3.0 (L) 3.5 - 5.0 g/dL   AST 23 15 - 41 U/L   ALT 17 0 - 44 U/L   Alkaline Phosphatase 68 38 - 126 U/L   Total Bilirubin 1.3 (H) 0.3 - 1.2 mg/dL   GFR calc non Af Amer >60 >60 mL/min   GFR calc Af Amer >60 >60 mL/min   Anion gap 6 5 - 15  CBC with Differential   Collection Time: 06/14/18  8:57 AM  Result Value Ref Range   WBC 7.7 4.0 - 10.5 K/uL   RBC 3.76 (L) 3.87 - 5.11 MIL/uL  Hemoglobin 10.6 (L) 12.0 - 15.0 g/dL   HCT 32.3 (L) 36.0 - 46.0 %    MCV 85.9 80.0 - 100.0 fL   MCH 28.2 26.0 - 34.0 pg   MCHC 32.8 30.0 - 36.0 g/dL   RDW 16.7 (H) 11.5 - 15.5 %   Platelets 383 150 - 400 K/uL   nRBC 0.0 0.0 - 0.2 %   Neutrophils Relative % 70 %   Neutro Abs 5.5 1.7 - 7.7 K/uL   Lymphocytes Relative 13 %   Lymphs Abs 1.0 0.7 - 4.0 K/uL   Monocytes Relative 11 %   Monocytes Absolute 0.8 0.1 - 1.0 K/uL   Eosinophils Relative 4 %   Eosinophils Absolute 0.3 0.0 - 0.5 K/uL   Basophils Relative 1 %   Basophils Absolute 0.1 0.0 - 0.1 K/uL   Immature Granulocytes 1 %   Abs Immature Granulocytes 0.04 0.00 - 0.07 K/uL     Admissions: 02/20/2016 - 02/26/2016:  [facility]:  She had renal failure and hypercalcemia. She was treated with IVF, calcitonin and Zometa.The patient has a history of coronary artery disease s/p CABG in 1993.  She has no history of heart failure.  01/20/2018 - 01/23/2018:  [facility].  She presented with nausea, vomiting, diarrhea and generalized weakness. Work-up showed hypokalemia, hypomagnesemia, dehydration and UTI.  She was treated with IV fluids and empiric IV antibiotics. Potassium and magnesium were repleted. Urine culture showed E. coli.  She was discharged on Augmentin. She was switched from HCTZ to chorthalidone. 02/01/2018 - 02/03/2018:  [facility] with hyponatremia.   Radiology studies: 02/20/2016:  Chest, abdomen and pelvic CT revealed a 3.7 cm subpleural mass within the left lower lobe, a 1.1 cm right lower lobe subpleural nodule , 9 mm RUL nodule, and 0.4 cm lingular nodule.  Between the left second, third, and fourth ribs there was 1.9 cm soft tissue mass. There was a 2.5 cm low-attenuation nodule in the left lobe of the thyroid.   There were prominent left axillary nodes (up to 1.0 cm).   There was no definite mediastinal adenopathy. There was a 5.5 x 4.6 cm cystic lesion in the left hepatic lobe.  There was a 3.1 cm nodule in the right adrenal gland. There was  a 1.2 cm right retrocaval lymph node with additional prominent subcentimeter retroperitoneal nodes. There was 1.0 cm left common iliac node. There was a 6.2 x 5.2 cm soft tissue mass within the right ilium.  02/21/2016:  Head MRI revealed no evidence of malignancy.  02/21/2016:  Thyroid ultrasound revealed a 2.4 cm solid nodule in the inferior aspect of the left lobe 03/03/2016:  PET scan revealed a hypermetabolic left lower lobe mass, bilateral pleural and pulmonary parenchymal nodular metastasis, a large pleural lesion invading the anterior left chest wall.   There was activity in the superior left ocular orbit.  There was a large lesion centered in the right iliac wing with soft tissue extension.  There were hypermetabolic right external iliac lymph nodes 06/06/2016:  PET scan revealed significant interval response to therapy.  There was significant decrease in size and FDG uptake associated with left lower lobe lung lesion.  There was resolution of left-sided pleural base metastasis and left axillary and anterior mediastinal nodal metastasis.  There was resolution of previous hypermetabolic soft tissue metastasis/nodal metastasis posterior to the IVC.  There was marked improvement in hypermetabolic metastasis involving the right iliac wing.  There was  persistent right upper lobe lung nodule which exhibits intense radiotracer uptake which may represent a focus of metastatic disease or synchronous primary lung neoplasm. 06/01/2017:  PET scan revealed no new or progressive findings.  The 9 mm RUL pulmonary nodule remained hypermetabolic (SUV 17.5, previously 7.3) and no change in size.  The index lesion in the posterior left lower lobe was minimally smaller on CT imaging with very low level FDG uptake (SUV 1.6).  There was no change in the appearance at site of previous disease at the right iliac wing (SUV 1.9 compared to 2.6 previously). 01/29/2017:  PET scan revealed interval progression of hypermetabolic  nodules in the right upper (13 mm compared to 9 mm; SUV 10) and left lower lobes (26 mm compared to 22 mm; SUV 7.5).  There was new hypermetabolic focus of activity along the anterior left pleura although no underlying pleural or lung mass was evident. 09/18/2017:  PET scan revealed a mixed appearance, with marked improvement in the prior 2 pulmonary nodules; but with new Deauville 5 left common iliac lymph node enlargement; new Deauville 5 right paraspinal lesion; and new Deauville 4 left infrahilar nodal activity.  There was a focus of accentuated metabolic activity which had been present along the right lower submandibular gland but currently seemed to project over the subcutaneous tissues without CT correlate. Current maximum SUV 10.5, previously 9.7.  There was increased accentuated activity along the left lateral spurring at L2-3. While typically such activity is inflammatory due to spurring, this had increased significantly since the prior exam and merit surveillance. There was a new 8 by 5 mm right upper lobe pulmonary nodule, Deauville 2, which also merits surveillance. 01/29/2018:  PET scan revealed persistent and progressive areas of hypermetabolism within the right submandibular gland, lingula of the left lung, left common iliac nodal chain, and right lumbar paraspinal soft tissues. Findings were compatible with Deauville criteria 4 and 5.  There was a new nodule is identified within the lingula which demonstrated intense uptake compatible with Deauville criteria 5.  05/14/2018:  Chest, abdomen, and pelvic CT revealed enlarging mediastinal adenopathy and right upper lobe nodule, indicative of disease progression. Necrotic lingular nodules were grossly stable in size with developing surrounding collapse/consolidation, possibly treatment related.  There was slight decrease in size of a left external iliac lymph node and right lumbar paraspinal soft tissue lesion.  There was a tiny left pleural  effusion.   Assessment:  Toni Griffith is a 77 y.o. female with stage IVB B-cell lymphoma, unclassifiable, with features intermediate between diffuse large B-cell lymphoma and classical Hodgkin's lymphoma ("gray zone" lymphoma).  She underwent right iliac wing bone/bone marrow biopsy on 03/08/2016.  PET scan on 03/03/2016 revealed a hypermetabolic left lower lobe mass, bilateral pleural and pulmonary parenchymal nodular metastasis, a large pleural lesion invading the anterior left chest wall.   There was activity in the superior left ocular orbit.  There was a large lesion centered in the right iliac wing with soft tissue extension.  There were hypermetabolic right external iliac lymph nodes  Echo on 04/04/2016 revealed an EF of 55-60%.  Hepatitis B and C testing was negative on 03/30/2016.   G6PD assay was normal.  She declined LP with MTX prophylaxis.  She received 6 cycles of mini-RCHOP (04/07/2016 - 07/21/2016).  She has had some transient fingertip numbness.  She received radiation to the right iliac wing,  from 10/11/2016 - 11/01/2016.  She received 3000 cGy over 3 weeks.  PET scan on  01/29/2017 revealed interval progression of hypermetabolic nodules in the right upper (13 mm compared to 9 mm; SUV 10) and left lower lobes (26 mm compared to 22 mm; SUV 7.5).  There was new hypermetabolic focus of activity along the anterior left pleura although no underlying pleural or lung mass was evident.  She has squamous cell lung cancer.  Foundation One on the lung biopsy on 03/15/2018 revealed MS-stable, tumor mutational burden 5 Muts/Mb, AKT2 amplification, CCNE1 amplification, CREBBP R1446L, KDR amplification, KIT amplification, PDGFRA amplification, PTEN loss exons 2-4, and MA26 splice site 333+5K>T. Marland Kitchen  There were no alterations in EGFR, KRAS, RET, ALK, MET, ERBB2, BRAF, ROS1.  PDL-1 IHC analysis revealed a TPS score of 50%.  CT guided left lower lobe nodule biopsy on 02/19/2017 confirmed  squamous cell carcinoma.  She underwent SBRT 03/26/2017 - 04/09/2017.  She received 5000 cGy in 5 fractions to the left lower lobe lesion.  She underwent SBRT to a RUL lesion from 05/23/2017 - 06/18/2017.  CT guided left upper lobe nodule biopsy on 02/20/2018 revealed squamous cell lung cancer.  She received SBRT from 04/09/2018 - 04/15/2018.   She received 6000 cGy in 5 fractions.    Bronchoscopy on 05/30/2018 revealed no endobronchial lesions.  The 2 cm pre-carinal lymph node was biopsied.  Pathology revealed metastatic non-small cell carcinoma, favor squamous cell carcinoma.  PET scan on 09/18/2017 revealed a mixed appearance, with marked improvement in the prior 2 pulmonary nodules; but with new Deauville 5 left common iliac lymph node enlargement; new Deauville 5 right paraspinal lesion; and new Deauville 4 left infrahilar nodal activity.  There was a focus of accentuated metabolic activity which had been present along the right lower submandibular gland but currently seemed to project over the subcutaneous tissues without CT correlate. Current maximum SUV 10.5, previously 9.7. There was increased accentuated activity along the left lateral spurring at L2-3. While typically such activity is inflammatory due to spurring, this had increased significantly since the prior exam and merit surveillance. There was a new 8 by 5 mm right upper lobe pulmonary nodule, Deauville 2.  CT guided right paraspinal lesion biopsy on 10/16/2017 was compatible with involvement by the patient's previously diagnosed lymphoma.  The case was referred to [facility] hematopathology.  The neoplastic cells were largely CD30 negative.  Repeat CD30 immunohistochemistry faintly stained > 10% of the large atypical cells. CD20 stained the scattered large atypical cells and few small cells.   She received 3 cycles of brentuximab vedotin (11/23/2017 - 01/11/2018).  PET scan on 01/29/2018 revealed persistent and progressive areas of  hypermetabolism within the right submandibular gland, lingula of the left lung, left common iliac nodal chain, and right lumbar paraspinal soft tissues. Findings were compatible with Deauville criteria 4 and 5.  There was a new nodule is identified within the lingula which demonstrated intense uptake compatible with Deauville criteria 5.   Chest, abdomen, and pelvic CT on 05/14/2018 revealed enlarging mediastinal adenopathy and right upper lobe nodule, indicative of disease progression. Necrotic lingular nodules were grossly stable in size with developing surrounding collapse/consolidation, possibly treatment related.  There was slight decrease in size of a left external iliac lymph node and right lumbar paraspinal soft tissue lesion.  There was a tiny left pleural effusion.  She has a normocytic anemia.  Labs on 04/17/2016 revealed an elevated ferritin (444), iron saturation (11%), low TIBC (240), B12 (2409), and folate (10.8).  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  B12  and folate were normal on 12/06/2017.  She is deaf and requires an interpreter.  Symptomatically, she is doing well.  She denies any B symptoms.  She denies any respiratory symptoms.  She had a recent fall.  Exam is unremarkable. Bilirubin is 1.3.  Plan: 1. Labs today:  CBC with diff, CMP, LDH, uric acid, CEA, TSH. 2. Stage IVB gray zone lymphoma Begin cycle #1 pembrolizumab today. Potential side effects reviewed.  Patient consented to treatment. Refill ondansetron. Refill Norco. 3. Stage III/IVquamous cell lung cancer  Begin cycle #1 pembrolizumab.  Potential side effects reviewed.  Patient consented to treatment.  Head MRI scheduled for 06/24/2018. 4. Recent fall: Discuss consideration of Life Alert necklace. 5.  RTC on 07/05/2018 for MD assessment, labs (CBC with diff, CMP, LDH, uric acid, TSH) and cycle #2 pembrolizumab.  6.  RTC on 07/26/2018 for MD assessment, labs (CBC with diff, CMP, LDH, uric  acid, TSH) and cycle #3 pembrolizumab.    Honor Loh, NP  06/14/18, 9:54 AM   I saw and evaluated the patient, participating in the key portions of the service and reviewing pertinent diagnostic studies and records.  I reviewed the nurse practitioner's note and agree with the findings and the plan.  The assessment and plan were discussed with the patient.  Multiple questions were asked by the patient and answered.   Nolon Stalls, MD 06/14/18, 9:54 AM

## 2018-06-14 NOTE — Patient Instructions (Signed)
Pembrolizumab injection  What is this medicine?  PEMBROLIZUMAB (pem broe liz ue mab) is a monoclonal antibody. It is used to treat melanoma, head and neck cancer, Hodgkin lymphoma, non-small cell lung cancer, urothelial cancer, stomach cancer, and cancers that have a certain genetic condition.  This medicine may be used for other purposes; ask your health care provider or pharmacist if you have questions.  COMMON BRAND NAME(S): Keytruda  What should I tell my health care provider before I take this medicine?  They need to know if you have any of these conditions:  -diabetes  -immune system problems  -inflammatory bowel disease  -liver disease  -lung or breathing disease  -lupus  -organ transplant  -an unusual or allergic reaction to pembrolizumab, other medicines, foods, dyes, or preservatives  -pregnant or trying to get pregnant  -breast-feeding  How should I use this medicine?  This medicine is for infusion into a vein. It is given by a health care professional in a hospital or clinic setting.  A special MedGuide will be given to you before each treatment. Be sure to read this information carefully each time.  Talk to your pediatrician regarding the use of this medicine in children. While this drug may be prescribed for selected conditions, precautions do apply.  Overdosage: If you think you have taken too much of this medicine contact a poison control center or emergency room at once.  NOTE: This medicine is only for you. Do not share this medicine with others.  What if I miss a dose?  It is important not to miss your dose. Call your doctor or health care professional if you are unable to keep an appointment.  What may interact with this medicine?  Interactions have not been studied.  Give your health care provider a list of all the medicines, herbs, non-prescription drugs, or dietary supplements you use. Also tell them if you smoke, drink alcohol, or use illegal drugs. Some items may interact with your  medicine.  This list may not describe all possible interactions. Give your health care provider a list of all the medicines, herbs, non-prescription drugs, or dietary supplements you use. Also tell them if you smoke, drink alcohol, or use illegal drugs. Some items may interact with your medicine.  What should I watch for while using this medicine?  Your condition will be monitored carefully while you are receiving this medicine.  You may need blood work done while you are taking this medicine.  Do not become pregnant while taking this medicine or for 4 months after stopping it. Women should inform their doctor if they wish to become pregnant or think they might be pregnant. There is a potential for serious side effects to an unborn child. Talk to your health care professional or pharmacist for more information. Do not breast-feed an infant while taking this medicine or for 4 months after the last dose.  What side effects may I notice from receiving this medicine?  Side effects that you should report to your doctor or health care professional as soon as possible:  -allergic reactions like skin rash, itching or hives, swelling of the face, lips, or tongue  -bloody or black, tarry  -breathing problems  -changes in vision  -chest pain  -chills  -constipation  -cough  -dizziness or feeling faint or lightheaded  -fast or irregular heartbeat  -fever  -flushing  -hair loss  -low blood counts - this medicine may decrease the number of white blood cells, red blood cells   and platelets. You may be at increased risk for infections and bleeding.  -muscle pain  -muscle weakness  -persistent headache  -signs and symptoms of high blood sugar such as dizziness; dry mouth; dry skin; fruity breath; nausea; stomach pain; increased hunger or thirst; increased urination  -signs and symptoms of kidney injury like trouble passing urine or change in the amount of urine  -signs and symptoms of liver injury like dark urine, light-colored  stools, loss of appetite, nausea, right upper belly pain, yellowing of the eyes or skin  -stomach pain  -sweating  -weight loss  Side effects that usually do not require medical attention (report to your doctor or health care professional if they continue or are bothersome):  -decreased appetite  -diarrhea  -tiredness  This list may not describe all possible side effects. Call your doctor for medical advice about side effects. You may report side effects to FDA at 1-800-FDA-1088.  Where should I keep my medicine?  This drug is given in a hospital or clinic and will not be stored at home.  NOTE: This sheet is a summary. It may not cover all possible information. If you have questions about this medicine, talk to your doctor, pharmacist, or health care provider.   2018 [employer] (2016-04-25 12:29:36)

## 2018-06-15 LAB — CEA: CEA: 2.1 ng/mL (ref 0.0–4.7)

## 2018-06-21 ENCOUNTER — Encounter: Payer: Self-pay | Admitting: Hematology and Oncology

## 2018-06-24 ENCOUNTER — Ambulatory Visit
Admission: RE | Admit: 2018-06-24 | Discharge: 2018-06-24 | Disposition: A | Discharge status: Home / Self Care | Payer: Medicare HMO | Source: Ambulatory Visit | Attending: Urgent Care | Admitting: Urgent Care

## 2018-06-24 DIAGNOSIS — C3492 Malignant neoplasm of unspecified part of left bronchus or lung: Secondary | ICD-10-CM | POA: Insufficient documentation

## 2018-06-24 MED ORDER — GADOBUTROL 1 MMOL/ML IV SOLN
7.5000 mL | Freq: Once | INTRAVENOUS | Status: AC | PRN
  Administered 2018-06-24: 7.5 mL via INTRAVENOUS

## 2018-07-05 ENCOUNTER — Encounter: Payer: Self-pay | Admitting: Hematology and Oncology

## 2018-07-05 ENCOUNTER — Other Ambulatory Visit: Payer: Self-pay

## 2018-07-05 ENCOUNTER — Inpatient Hospital Stay: Payer: Medicare HMO

## 2018-07-05 ENCOUNTER — Inpatient Hospital Stay: Payer: Medicare HMO | Attending: Hematology and Oncology | Admitting: Hematology and Oncology

## 2018-07-05 ENCOUNTER — Inpatient Hospital Stay: Payer: Medicare HMO | Attending: Hematology and Oncology

## 2018-07-05 VITALS — BP 96/58 | HR 101 | Temp 98.0°F | Resp 16 | Ht 60.0 in | Wt 171.6 lb

## 2018-07-05 VITALS — BP 108/68 | HR 88

## 2018-07-05 DIAGNOSIS — Z923 Personal history of irradiation: Secondary | ICD-10-CM | POA: Insufficient documentation

## 2018-07-05 DIAGNOSIS — H9190 Unspecified hearing loss, unspecified ear: Secondary | ICD-10-CM | POA: Diagnosis not present

## 2018-07-05 DIAGNOSIS — C3490 Malignant neoplasm of unspecified part of unspecified bronchus or lung: Secondary | ICD-10-CM

## 2018-07-05 DIAGNOSIS — G620 Drug-induced polyneuropathy: Secondary | ICD-10-CM | POA: Insufficient documentation

## 2018-07-05 DIAGNOSIS — Z87891 Personal history of nicotine dependence: Secondary | ICD-10-CM | POA: Insufficient documentation

## 2018-07-05 DIAGNOSIS — M25552 Pain in left hip: Secondary | ICD-10-CM | POA: Diagnosis not present

## 2018-07-05 DIAGNOSIS — D649 Anemia, unspecified: Secondary | ICD-10-CM

## 2018-07-05 DIAGNOSIS — C786 Secondary malignant neoplasm of retroperitoneum and peritoneum: Secondary | ICD-10-CM

## 2018-07-05 DIAGNOSIS — Z7982 Long term (current) use of aspirin: Secondary | ICD-10-CM | POA: Diagnosis not present

## 2018-07-05 DIAGNOSIS — C3432 Malignant neoplasm of lower lobe, left bronchus or lung: Secondary | ICD-10-CM | POA: Diagnosis not present

## 2018-07-05 DIAGNOSIS — M549 Dorsalgia, unspecified: Secondary | ICD-10-CM

## 2018-07-05 DIAGNOSIS — M25551 Pain in right hip: Secondary | ICD-10-CM | POA: Diagnosis not present

## 2018-07-05 DIAGNOSIS — E538 Deficiency of other specified B group vitamins: Secondary | ICD-10-CM | POA: Insufficient documentation

## 2018-07-05 DIAGNOSIS — D509 Iron deficiency anemia, unspecified: Secondary | ICD-10-CM | POA: Diagnosis not present

## 2018-07-05 DIAGNOSIS — Z5112 Encounter for antineoplastic immunotherapy: Secondary | ICD-10-CM

## 2018-07-05 DIAGNOSIS — C8330 Diffuse large B-cell lymphoma, unspecified site: Secondary | ICD-10-CM

## 2018-07-05 DIAGNOSIS — F329 Major depressive disorder, single episode, unspecified: Secondary | ICD-10-CM | POA: Diagnosis not present

## 2018-07-05 DIAGNOSIS — C771 Secondary and unspecified malignant neoplasm of intrathoracic lymph nodes: Secondary | ICD-10-CM | POA: Insufficient documentation

## 2018-07-05 DIAGNOSIS — Z79899 Other long term (current) drug therapy: Secondary | ICD-10-CM

## 2018-07-05 DIAGNOSIS — C8590 Non-Hodgkin lymphoma, unspecified, unspecified site: Secondary | ICD-10-CM

## 2018-07-05 DIAGNOSIS — C3411 Malignant neoplasm of upper lobe, right bronchus or lung: Secondary | ICD-10-CM | POA: Insufficient documentation

## 2018-07-05 DIAGNOSIS — C8518 Unspecified B-cell lymphoma, lymph nodes of multiple sites: Secondary | ICD-10-CM

## 2018-07-05 LAB — CBC WITH DIFFERENTIAL/PLATELET
Abs Immature Granulocytes: 0.04 10*3/uL (ref 0.00–0.07)
Basophils Absolute: 0.1 10*3/uL (ref 0.0–0.1)
Basophils Relative: 1 %
Eosinophils Absolute: 0.3 10*3/uL (ref 0.0–0.5)
Eosinophils Relative: 4 %
HCT: 32.3 % — ABNORMAL LOW (ref 36.0–46.0)
Hemoglobin: 10.4 g/dL — ABNORMAL LOW (ref 12.0–15.0)
Immature Granulocytes: 1 %
Lymphocytes Relative: 15 %
Lymphs Abs: 1.2 10*3/uL (ref 0.7–4.0)
MCH: 27.5 pg (ref 26.0–34.0)
MCHC: 32.2 g/dL (ref 30.0–36.0)
MCV: 85.4 fL (ref 80.0–100.0)
Monocytes Absolute: 0.8 10*3/uL (ref 0.1–1.0)
Monocytes Relative: 11 %
Neutro Abs: 5.1 10*3/uL (ref 1.7–7.7)
Neutrophils Relative %: 68 %
Platelets: 346 10*3/uL (ref 150–400)
RBC: 3.78 MIL/uL — ABNORMAL LOW (ref 3.87–5.11)
RDW: 17.3 % — ABNORMAL HIGH (ref 11.5–15.5)
WBC: 7.5 10*3/uL (ref 4.0–10.5)
nRBC: 0 % (ref 0.0–0.2)

## 2018-07-05 LAB — COMPREHENSIVE METABOLIC PANEL
ALT: 19 U/L (ref 0–44)
AST: 29 U/L (ref 15–41)
Albumin: 2.9 g/dL — ABNORMAL LOW (ref 3.5–5.0)
Alkaline Phosphatase: 71 U/L (ref 38–126)
Anion gap: 8 (ref 5–15)
BUN: 22 mg/dL (ref 8–23)
CO2: 24 mmol/L (ref 22–32)
Calcium: 9.2 mg/dL (ref 8.9–10.3)
Chloride: 108 mmol/L (ref 98–111)
Creatinine, Ser: 1.04 mg/dL — ABNORMAL HIGH (ref 0.44–1.00)
GFR calc Af Amer: >60 mL/min (ref >60)
GFR calc non Af Amer: 53 mL/min — ABNORMAL LOW (ref >60)
Glucose, Bld: 125 mg/dL — ABNORMAL HIGH (ref 70–99)
Potassium: 3.5 mmol/L (ref 3.5–5.1)
Sodium: 140 mmol/L (ref 135–145)
Total Bilirubin: 1.1 mg/dL (ref 0.3–1.2)
Total Protein: 6.5 g/dL (ref 6.5–8.1)

## 2018-07-05 LAB — URIC ACID: Uric Acid, Serum: 3.8 mg/dL (ref 2.5–7.1)

## 2018-07-05 LAB — TSH: TSH: 2.192 u[IU]/mL (ref 0.350–4.500)

## 2018-07-05 LAB — LACTATE DEHYDROGENASE: LDH: 142 U/L (ref 98–192)

## 2018-07-05 MED ORDER — HEPARIN SOD (PORK) LOCK FLUSH 100 UNIT/ML IV SOLN
500.0000 [IU] | Freq: Once | INTRAVENOUS | Status: AC | PRN
  Administered 2018-07-05: 500 [IU]
  Filled 2018-07-05: qty 5

## 2018-07-05 MED ORDER — PEMBROLIZUMAB CHEMO INJECTION 100 MG/4ML: 200.0000 mg | Freq: Once | INTRAVENOUS | Status: DC

## 2018-07-05 MED ORDER — ONDANSETRON HCL 4 MG PO TABS
8.0000 mg | ORAL_TABLET | Freq: Once | ORAL | Status: AC
  Administered 2018-07-05: 8 mg via ORAL
  Filled 2018-07-05: qty 2

## 2018-07-05 MED ORDER — SODIUM CHLORIDE 0.9% FLUSH
10.0000 mL | INTRAVENOUS | Status: DC | PRN
  Administered 2018-07-05: 10 mL
  Filled 2018-07-05: qty 10

## 2018-07-05 MED ORDER — SODIUM CHLORIDE 0.9 % IV SOLN
Freq: Once | INTRAVENOUS | Status: AC
  Administered 2018-07-05: 11:00:00 via INTRAVENOUS
  Filled 2018-07-05: qty 250

## 2018-07-05 MED ORDER — SODIUM CHLORIDE 0.9 % IV SOLN
200.0000 mg | Freq: Once | INTRAVENOUS | Status: AC
  Administered 2018-07-05: 200 mg via INTRAVENOUS
  Filled 2018-07-05: qty 8

## 2018-07-05 NOTE — Progress Notes (Signed)
See follow up.

## 2018-07-05 NOTE — Progress Notes (Signed)
Sitting b/p 89/61 p 97 Standing 96/58 p116

## 2018-07-05 NOTE — Progress Notes (Signed)
[facility] day:  07/05/18  Chief Complaint: Toni Griffith is a 77 y.o. 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  HPI:  The patient was last seen in the medical oncology clinic on 06/14/2018.  At that time, she denied any B symptoms.  She denied any respiratory symptoms.  Scans revealed active lymphoma and squamous cell lung cancer.  Mediastinal lymph node biopsy was + squamous cell lung cancer (PDL-1 high).  Decision was made to initiate pembrolizumab.  Head MRI on 06/24/2018 revealed no evidence of intracranial metastasis.  During the interim, patient is doing well. The "burning" sensation in her LEFT lateral torso has improved. Patient denies that she has experienced any B symptoms. She denies any interval infections.   She denies shortness of breath and episodes of chest pain. Neuropathy in hands and feet is "about the same". Pain in her back, hips, and knees is also about the same. She gets frustrated when "people talk to much to her". She feels depressed when she is alone. Patient states, "I feel left out".    Past Medical History:  Diagnosis Date  . Arthritis   . Cancer [facility])    lymphoma-right hip  . Deaf   . GERD (gastroesophageal reflux disease)   . Hypertension    NOT ON MED AT PRESENT    Past Surgical History:  Procedure Laterality Date  . CARDIAC SURGERY  1993  . CORONARY ARTERY BYPASS GRAFT     DOUBLE  . CYST REMOVAL HAND Right   . ENDOBRONCHIAL ULTRASOUND N/A 05/30/2018   Procedure: ENDOBRONCHIAL ULTRASOUND;  Surgeon: Tyler Pita, MD;  Location: [facility] ORS;  Service: Cardiopulmonary;  Laterality: N/A;  . PERIPHERAL VASCULAR CATHETERIZATION N/A 04/06/2016   Procedure: Glori Luis Cath Insertion;  Surgeon: Algernon Huxley, MD;  Location: [facility] LAB;  Service: Cardiovascular;  Laterality: N/A;    Family History  Problem  Relation Age of Onset  . Lung cancer Mother   . Lung cancer Father   . Diabetes Maternal Uncle   . Myasthenia gravis Maternal Grandmother   . Leukemia Maternal Grandfather     Social History:  reports that she quit smoking about 2 years ago. Her smoking use included cigarettes. She has a 80.00 pack-year smoking history. She has never used smokeless tobacco. She reports that she drinks alcohol. She reports that she does not use drugs.  She smoked 1 1/2 packs per day for 20 years (30 pack year smoking history) until 10/2015.  She is hearing impaired (deaf).  She lives in [place] with her boyfriend.  Her daughter lives in [place].  Her daughter, [name]'s contact number is [phone].  She is living with her son.  The patient is accompanied her daughter, son, and the interpreter.  Allergies: No Known Allergies  Current Medications: Current Outpatient Medications  Medication Sig Dispense Refill  . acetaminophen (TYLENOL) 500 MG tablet Take 500 mg by mouth every 6 (six) hours as needed for moderate pain.     Marland Kitchen allopurinol (ZYLOPRIM) 300 MG tablet TAKE 1 TABLET BY MOUTH EVERY DAY (Patient taking differently: Take 300 mg by mouth daily. ) 90 tablet 1  . aspirin EC 81 MG tablet Take 81 mg by mouth daily.    Marland Kitchen atorvastatin (LIPITOR) 80 MG tablet Take 80 mg by mouth every evening.     Marland Kitchen b complex vitamins tablet  Take 1 tablet by mouth daily.    . Cholecalciferol (VITAMIN D3) 2000 units capsule Take 2,000 Units by mouth daily.    . diclofenac sodium (VOLTAREN) 1 % GEL Apply 2 g topically 3 (three) times daily as needed (left knee). (Patient taking differently: Apply 2 g topically 2 (two) times daily. ) 1 Tube 1  . FLUoxetine (PROZAC) 40 MG capsule Take 40 mg by mouth daily.     Marland Kitchen gabapentin (NEURONTIN) 100 MG capsule Take 2 capsules (200 mg total) by mouth at bedtime. 60 capsule 3  . HYDROcodone-acetaminophen (NORCO) 5-325 MG tablet Take 1 tablet by mouth every 6 (six) hours as needed for moderate  pain. 30 tablet 0  . lidocaine-prilocaine (EMLA) cream Apply cream 1 hour before chemotherapy treatment and place small peive of saran wrap over cream to protect clothing 30 g 3  . Magnesium 250 MG TABS Take 1 tablet by mouth daily.    . ondansetron (ZOFRAN ODT) 4 MG disintegrating tablet Take 1 tablet (4 mg total) by mouth every 8 (eight) hours as needed for nausea or vomiting. 30 tablet 1  . pantoprazole (PROTONIX) 40 MG tablet Take 40 mg by mouth daily.     . vitamin B-12 (CYANOCOBALAMIN) 500 MCG tablet Take 500 mcg by mouth daily.    . vitamin E 400 UNIT capsule Take 400 Units by mouth daily.      No current facility-administered medications for this visit.    Facility-Administered Medications Ordered in Other Visits  Medication Dose Route Frequency Provider Last Rate Last Dose  . heparin lock flush 100 unit/mL  500 Units Intravenous Once Corcoran, Melissa C, MD      . sodium chloride flush (NS) 0.9 % injection 10 mL  10 mL Intravenous PRN Nolon Stalls C, MD   10 mL at 04/05/17 1034    Review of Systems  Constitutional: Negative for chills, diaphoresis, fever, malaise/fatigue and weight loss (up 6 pounds).       Feels "fine".  HENT: Positive for hearing loss (deaf). Negative for congestion, nosebleeds, sinus pain and sore throat.   Eyes: Negative.  Negative for double vision, photophobia, pain, discharge and redness.  Respiratory: Negative.  Negative for hemoptysis, sputum production and shortness of breath.   Cardiovascular: Negative.  Negative for chest pain, palpitations, orthopnea, leg swelling and PND.  Gastrointestinal: Negative.  Negative for abdominal pain, blood in stool, constipation, diarrhea, heartburn, melena, nausea and vomiting.  Genitourinary: Negative.  Negative for dysuria, frequency, hematuria and urgency.  Musculoskeletal: Positive for back pain (lower) and joint pain (hips; LEFT knee). Negative for falls, myalgias and neck pain.  Skin: Negative.  Negative for  itching and rash.  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 Negative for dizziness, tremors, focal weakness, weakness and headaches.  Endo/Heme/Allergies: Negative.  Does not bruise/bleed easily.  Psychiatric/Behavioral: Negative for depression, memory loss and suicidal ideas. The patient is not nervous/anxious and does not have insomnia.   All other systems reviewed and are negative.  Performance status (ECOG): 2 - Symptomatic, <50% confined to bed  Vital Signs BP 95/64 (BP Location: Left Arm, Patient Position: Sitting)   Pulse (!) 101   Temp 98 F (36.7 C) (Tympanic)   Resp 16   Ht 5' (1.524 m)   Wt 171 lb 9.6 oz (77.8 kg)   BMI 33.51 kg/m   Physical Exam  Constitutional: She is oriented to person, place, and time and well-developed, well-nourished, and in no distress. No distress.  HENT:  Head: Normocephalic and atraumatic.  Mouth/Throat: Oropharynx is clear and moist. No oropharyngeal exudate.  Short gray hair.  Dentures.  Eyes: Pupils are equal, round, and reactive to light. Conjunctivae and EOM are normal. No scleral icterus.  Hazel eyes  Neck: Normal range of motion. Neck supple. No JVD present.  Cardiovascular: Normal rate, regular rhythm and normal heart sounds. Exam reveals no gallop and no friction rub.  No murmur heard. Pulmonary/Chest: Effort normal and breath sounds normal. No respiratory distress. She has no rales.  Abdominal: Soft. Bowel sounds are normal. She exhibits no distension and no mass. There is no abdominal tenderness. There is no rebound and no guarding.  Musculoskeletal: Normal range of motion.        General: No tenderness or edema.  Lymphadenopathy:    She has no cervical adenopathy.    She has no axillary adenopathy.       Right: No supraclavicular adenopathy present.       Left: No supraclavicular adenopathy present.  Neurological: She is alert and oriented to person, place, and time.  Skin: Skin is dry. No  rash noted. She is not diaphoretic. No erythema.  Psychiatric: Mood, affect and judgment normal.  Nursing note and vitals reviewed.   Results for orders placed or performed in visit on 07/05/18 (from the past 168 hour(s))  Lactate dehydrogenase   Collection Time: 07/05/18  9:08 AM  Result Value Ref Range   LDH 142 98 - 192 U/L  Comprehensive metabolic panel   Collection Time: 07/05/18  9:08 AM  Result Value Ref Range   Sodium 140 135 - 145 mmol/L   Potassium 3.5 3.5 - 5.1 mmol/L   Chloride 108 98 - 111 mmol/L   CO2 24 22 - 32 mmol/L   Glucose, Bld 125 (H) 70 - 99 mg/dL   BUN 22 8 - 23 mg/dL   Creatinine, Ser 1.04 (H) 0.44 - 1.00 mg/dL   Calcium 9.2 8.9 - 10.3 mg/dL   Total Protein 6.5 6.5 - 8.1 g/dL   Albumin 2.9 (L) 3.5 - 5.0 g/dL   AST 29 15 - 41 U/L   ALT 19 0 - 44 U/L   Alkaline Phosphatase 71 38 - 126 U/L   Total Bilirubin 1.1 0.3 - 1.2 mg/dL   GFR calc non Af Amer 53 (L) >60 mL/min   GFR calc Af Amer >60 >60 mL/min   Anion gap 8 5 - 15  CBC with Differential   Collection Time: 07/05/18  9:08 AM  Result Value Ref Range   WBC 7.5 4.0 - 10.5 K/uL   RBC 3.78 (L) 3.87 - 5.11 MIL/uL   Hemoglobin 10.4 (L) 12.0 - 15.0 g/dL   HCT 32.3 (L) 36.0 - 46.0 %   MCV 85.4 80.0 - 100.0 fL   MCH 27.5 26.0 - 34.0 pg   MCHC 32.2 30.0 - 36.0 g/dL   RDW 17.3 (H) 11.5 - 15.5 %   Platelets 346 150 - 400 K/uL   nRBC 0.0 0.0 - 0.2 %   Neutrophils Relative % 68 %   Neutro Abs 5.1 1.7 - 7.7 K/uL   Lymphocytes Relative 15 %   Lymphs Abs 1.2 0.7 - 4.0 K/uL   Monocytes Relative 11 %   Monocytes Absolute 0.8 0.1 - 1.0 K/uL   Eosinophils Relative 4 %   Eosinophils Absolute 0.3 0.0 - 0.5 K/uL   Basophils Relative 1 %   Basophils Absolute 0.1 0.0 - 0.1 K/uL   Immature  Granulocytes 1 %   Abs Immature Granulocytes 0.04 0.00 - 0.07 K/uL     Admissions: 02/20/2016 - 02/26/2016:  [facility] in [facility]:  She had renal failure and hypercalcemia. She was treated with IVF, calcitonin and  Zometa.The patient has a history of coronary artery disease s/p CABG in 1993.  She has no history of heart failure.  01/20/2018 - 01/23/2018:  [facility] ([place]).  She presented with nausea, vomiting, diarrhea and generalized weakness. Work-up showed hypokalemia, hypomagnesemia, dehydration and UTI.  She was treated with IV fluids and empiric IV antibiotics. Potassium and magnesium were repleted. Urine culture showed E. coli.  She was discharged on Augmentin. She was switched from HCTZ to chorthalidone. 02/01/2018 - 02/03/2018:  [facility] with hyponatremia.   Radiology studies: 02/20/2016:  Chest, abdomen and pelvic CT revealed a 3.7 cm subpleural mass within the left lower lobe, a 1.1 cm right lower lobe subpleural nodule , 9 mm RUL nodule, and 0.4 cm lingular nodule.  Between the left second, third, and fourth ribs there was 1.9 cm soft tissue mass. There was a 2.5 cm low-attenuation nodule in the left lobe of the thyroid.   There were prominent left axillary nodes (up to 1.0 cm).   There was no definite mediastinal adenopathy. There was a 5.5 x 4.6 cm cystic lesion in the left hepatic lobe.  There was a 3.1 cm nodule in the right adrenal gland. There was a 1.2 cm right retrocaval lymph node with additional prominent subcentimeter retroperitoneal nodes. There was 1.0 cm left common iliac node. There was a 6.2 x 5.2 cm soft tissue mass within the right ilium.  02/21/2016:  Head MRI revealed no evidence of malignancy.  02/21/2016:  Thyroid ultrasound revealed a 2.4 cm solid nodule in the inferior aspect of the left lobe 03/03/2016:  PET scan revealed a hypermetabolic left lower lobe mass, bilateral pleural and pulmonary parenchymal nodular metastasis, a large pleural lesion invading the anterior left chest wall.   There was activity in the superior left ocular orbit.  There was a large lesion centered in the right iliac wing with soft tissue extension.  There were hypermetabolic  right external iliac lymph nodes 06/06/2016:  PET scan revealed significant interval response to therapy.  There was significant decrease in size and FDG uptake associated with left lower lobe lung lesion.  There was resolution of left-sided pleural base metastasis and left axillary and anterior mediastinal nodal metastasis.  There was resolution of previous hypermetabolic soft tissue metastasis/nodal metastasis posterior to the IVC.  There was marked improvement in hypermetabolic metastasis involving the right iliac wing.  There was persistent right upper lobe lung nodule which exhibits intense radiotracer uptake which may represent a focus of metastatic disease or synchronous primary lung neoplasm. 06/01/2017:  PET scan revealed no new or progressive findings.  The 9 mm RUL pulmonary nodule remained hypermetabolic (SUV 72.6, previously 7.3) and no change in size.  The index lesion in the posterior left lower lobe was minimally smaller on CT imaging with very low level FDG uptake (SUV 1.6).  There was no change in the appearance at site of previous disease at the right iliac wing (SUV 1.9 compared to 2.6 previously). 01/29/2017:  PET scan revealed interval progression of hypermetabolic nodules in the right upper (13 mm compared to 9 mm; SUV 10) and left lower lobes (26 mm compared to 22 mm; SUV 7.5).  There was new hypermetabolic focus of activity along the  anterior left pleura although no underlying pleural or lung mass was evident. 09/18/2017:  PET scan revealed a mixed appearance, with marked improvement in the prior 2 pulmonary nodules; but with new Deauville 5 left common iliac lymph node enlargement; new Deauville 5 right paraspinal lesion; and new Deauville 4 left infrahilar nodal activity.  There was a focus of accentuated metabolic activity which had been present along the right lower submandibular gland but currently seemed to project over the subcutaneous tissues without CT correlate. Current maximum  SUV 10.5, previously 9.7.  There was increased accentuated activity along the left lateral spurring at L2-3. While typically such activity is inflammatory due to spurring, this had increased significantly since the prior exam and merit surveillance. There was a new 8 by 5 mm right upper lobe pulmonary nodule, Deauville 2, which also merits surveillance. 01/29/2018:  PET scan revealed persistent and progressive areas of hypermetabolism within the right submandibular gland, lingula of the left lung, left common iliac nodal chain, and right lumbar paraspinal soft tissues. Findings were compatible with Deauville criteria 4 and 5.  There was a new nodule is identified within the lingula which demonstrated intense uptake compatible with Deauville criteria 5.  05/14/2018:  Chest, abdomen, and pelvic CT revealed enlarging mediastinal adenopathy and right upper lobe nodule, indicative of disease progression. Necrotic lingular nodules were grossly stable in size with developing surrounding collapse/consolidation, possibly treatment related.  There was slight decrease in size of a left external iliac lymph node and right lumbar paraspinal soft tissue lesion.  There was a tiny left pleural effusion. 06/24/2018:  Head MRI revealed no evidence of intracranial metastasis.   Assessment:  Toni Griffith is a 77 y.o. female with stage IVB B-cell lymphoma, unclassifiable, with features intermediate between diffuse large B-cell lymphoma and classical Hodgkin's lymphoma ("gray zone" lymphoma).  She underwent right iliac wing bone/bone marrow biopsy on 03/08/2016.  PET scan on 03/03/2016 revealed a hypermetabolic left lower lobe mass, bilateral pleural and pulmonary parenchymal nodular metastasis, a large pleural lesion invading the anterior left chest wall.   There was activity in the superior left ocular orbit.  There was a large lesion centered in the right iliac wing with soft tissue extension.  There were  hypermetabolic right external iliac lymph nodes  Echo on 04/04/2016 revealed an EF of 55-60%.  Hepatitis B and C testing was negative on 03/30/2016.   G6PD assay was normal.  She declined LP with MTX prophylaxis.  She received 6 cycles of mini-RCHOP (04/07/2016 - 07/21/2016).  She has had some transient fingertip numbness.  She received radiation to the right iliac wing,  from 10/11/2016 - 11/01/2016.  She received 3000 cGy over 3 weeks.  PET scan on 01/29/2017 revealed interval progression of hypermetabolic nodules in the right upper (13 mm compared to 9 mm; SUV 10) and left lower lobes (26 mm compared to 22 mm; SUV 7.5).  There was new hypermetabolic focus of activity along the anterior left pleura although no underlying pleural or lung mass was evident.  She has squamous cell lung cancer.  Foundation One on the lung biopsy on 03/15/2018 revealed MS-stable, tumor mutational burden 5 Muts/Mb, AKT2 amplification, CCNE1 amplification, CREBBP R1446L, KDR amplification, KIT amplification, PDGFRA amplification, PTEN loss exons 2-4, and ZO10 splice site 960+4V>W. Marland Kitchen  There were no alterations in EGFR, KRAS, RET, ALK, MET, ERBB2, BRAF, ROS1.  PDL-1 IHC analysis revealed a TPS score of 50%.  CT guided left lower lobe nodule biopsy on 02/19/2017 confirmed squamous  cell carcinoma.  She underwent SBRT 03/26/2017 - 04/09/2017.  She received 5000 cGy in 5 fractions to the left lower lobe lesion.  She underwent SBRT to a RUL lesion from 05/23/2017 - 06/18/2017.  CT guided left upper lobe nodule biopsy on 02/20/2018 revealed squamous cell lung cancer.  She received SBRT from 04/09/2018 - 04/15/2018.   She received 6000 cGy in 5 fractions.   Bronchoscopy on 05/30/2018 revealed no endobronchial lesions.  The 2 cm pre-carinal lymph node was biopsied.  Pathology revealed metastatic non-small cell carcinoma, favor squamous cell carcinoma.  PET scan on 09/18/2017 revealed a mixed appearance, with marked improvement in  the prior 2 pulmonary nodules; but with new Deauville 5 left common iliac lymph node enlargement; new Deauville 5 right paraspinal lesion; and new Deauville 4 left infrahilar nodal activity.  There was a focus of accentuated metabolic activity which had been present along the right lower submandibular gland but currently seemed to project over the subcutaneous tissues without CT correlate. Current maximum SUV 10.5, previously 9.7. There was increased accentuated activity along the left lateral spurring at L2-3. While typically such activity is inflammatory due to spurring, this had increased significantly since the prior exam and merit surveillance. There was a new 8 by 5 mm right upper lobe pulmonary nodule, Deauville 2.  CT guided right paraspinal lesion biopsy on 10/16/2017 was compatible with involvement by the patient's previously diagnosed lymphoma.  The case was referred to [facility] hematopathology.  The neoplastic cells were largely CD30 negative.  Repeat CD30 immunohistochemistry faintly stained > 10% of the large atypical cells. CD20 stained the scattered large atypical cells and few small cells.   She received 3 cycles of brentuximab vedotin (11/23/2017 - 01/11/2018).  PET scan on 01/29/2018 revealed persistent and progressive areas of hypermetabolism within the right submandibular gland, lingula of the left lung, left common iliac nodal chain, and right lumbar paraspinal soft tissues. Findings were compatible with Deauville criteria 4 and 5.  There was a new nodule is identified within the lingula which demonstrated intense uptake compatible with Deauville criteria 5.   Chest, abdomen, and pelvic CT on 05/14/2018 revealed enlarging mediastinal adenopathy and right upper lobe nodule, indicative of disease progression. Necrotic lingular nodules were grossly stable in size with developing surrounding collapse/consolidation, possibly treatment related.  There was slight decrease in size of a left external  iliac lymph node and right lumbar paraspinal soft tissue lesion.  There was a tiny left pleural effusion.  Head MRI on 06/24/2018 revealed no evidence of intracranial metastasis.  She is s/p cycle #1 pembrolizumab (06/14/2018).  She has a normocytic anemia.  Labs on 04/17/2016 revealed an elevated ferritin (444), iron saturation (11%), low TIBC (240), B12 (2409), and folate (10.8).  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  B12 and folate were normal on 12/06/2017.  She is deaf and requires an interpreter.  Symptomatically, she is doing well.  She denies any side effects from treatment.  She denies any B symptoms or respiratory symptoms.  Exam reveals no adenopathy or hepatosplenomegaly.  Blood pressure is slightly low today.  Hemoglobin is 10.4.  Plan: 1.   Labs today:  CBC with diff, CMP, LDH, uric acid, TSH. 2.   Pearline Cables zone lymphoma Tolerating treatment well. Cycle #2 pembrolizumab today. Discuss symptom management.  She has antiemetics and pain medications at home to use on a prn bases.  Interventions are adequate.    3.   Squamous cell lung cancer  Review interval head MRI- no evidence of metastatic disease.  Tolerating treatment well.  Cycle #2 pembrolizumab today. 4.   Low blood pressure:  Check orthostatics.    IVF 500 ccc then repeat orthostatics.  Discuss importance of good hydration. 5.   Chemotherapy-induced neuropathy Continue low-dose gabapentin.  6.   Normocytic anemia Labs reviewed.  Hemoglobin 10.4, hematocrit 32.3, MCV 85.4, and platelets 346,000. Ferritin 14 on 05/06/2018.  Iron saturation 6% with a TIBC of 343. Encourage iron rich foods. 7.   B12 deficiency B12 level 3458 pg/mL on 05/06/2018. Folate 10.3 ng/mL on 05/06/2018.  Patient on oral B12 500 mcg daily.  8.   RTC on 07/26/2018 for MD assessment, labs (CBC with diff, CMP, TSH, LDH, uric acid, ferritin), and cycle #3 pembrolizumab. 9.   RTC on 08/16/2018 for MD assessment, labs (CBC with  diff, CMP, TSH, LDH, uric acid), and cycle #4 pembrolizumab.    Honor Loh, NP  07/05/18, 10:14 AM   I saw and evaluated the patient, participating in the key portions of the service and reviewing pertinent diagnostic studies and records.  I reviewed the nurse practitioner's note and agree with the findings and the plan.  The assessment and plan were discussed with the patient.  Several questions were asked by the patient and answered.   Nolon Stalls, MD 07/05/18, 10:14 AM

## 2018-07-24 ENCOUNTER — Encounter: Payer: Self-pay | Admitting: Hematology and Oncology

## 2018-07-26 ENCOUNTER — Other Ambulatory Visit: Payer: Self-pay

## 2018-07-26 ENCOUNTER — Other Ambulatory Visit: Payer: Self-pay | Admitting: *Deleted

## 2018-07-26 ENCOUNTER — Encounter: Payer: Self-pay | Admitting: Hematology and Oncology

## 2018-07-26 ENCOUNTER — Inpatient Hospital Stay: Payer: Medicare HMO

## 2018-07-26 ENCOUNTER — Inpatient Hospital Stay (HOSPITAL_BASED_OUTPATIENT_CLINIC_OR_DEPARTMENT_OTHER): Payer: Medicare HMO | Admitting: Hematology and Oncology

## 2018-07-26 VITALS — BP 120/77 | HR 92 | Temp 97.6°F | Resp 18

## 2018-07-26 VITALS — BP 108/66 | HR 80 | Temp 97.6°F | Ht 60.0 in | Wt 163.0 lb

## 2018-07-26 DIAGNOSIS — Z5112 Encounter for antineoplastic immunotherapy: Secondary | ICD-10-CM

## 2018-07-26 DIAGNOSIS — C8330 Diffuse large B-cell lymphoma, unspecified site: Secondary | ICD-10-CM | POA: Diagnosis not present

## 2018-07-26 DIAGNOSIS — C786 Secondary malignant neoplasm of retroperitoneum and peritoneum: Secondary | ICD-10-CM

## 2018-07-26 DIAGNOSIS — Z79899 Other long term (current) drug therapy: Secondary | ICD-10-CM

## 2018-07-26 DIAGNOSIS — C8590 Non-Hodgkin lymphoma, unspecified, unspecified site: Secondary | ICD-10-CM

## 2018-07-26 DIAGNOSIS — C3432 Malignant neoplasm of lower lobe, left bronchus or lung: Secondary | ICD-10-CM

## 2018-07-26 DIAGNOSIS — D509 Iron deficiency anemia, unspecified: Secondary | ICD-10-CM

## 2018-07-26 DIAGNOSIS — Z923 Personal history of irradiation: Secondary | ICD-10-CM

## 2018-07-26 DIAGNOSIS — G620 Drug-induced polyneuropathy: Secondary | ICD-10-CM

## 2018-07-26 DIAGNOSIS — C3490 Malignant neoplasm of unspecified part of unspecified bronchus or lung: Secondary | ICD-10-CM

## 2018-07-26 DIAGNOSIS — T451X5A Adverse effect of antineoplastic and immunosuppressive drugs, initial encounter: Secondary | ICD-10-CM

## 2018-07-26 DIAGNOSIS — M25551 Pain in right hip: Secondary | ICD-10-CM

## 2018-07-26 DIAGNOSIS — M25552 Pain in left hip: Secondary | ICD-10-CM

## 2018-07-26 DIAGNOSIS — E538 Deficiency of other specified B group vitamins: Secondary | ICD-10-CM

## 2018-07-26 DIAGNOSIS — M549 Dorsalgia, unspecified: Secondary | ICD-10-CM

## 2018-07-26 DIAGNOSIS — Z87891 Personal history of nicotine dependence: Secondary | ICD-10-CM

## 2018-07-26 DIAGNOSIS — Z7982 Long term (current) use of aspirin: Secondary | ICD-10-CM

## 2018-07-26 DIAGNOSIS — D649 Anemia, unspecified: Secondary | ICD-10-CM

## 2018-07-26 DIAGNOSIS — F329 Major depressive disorder, single episode, unspecified: Secondary | ICD-10-CM

## 2018-07-26 DIAGNOSIS — C8518 Unspecified B-cell lymphoma, lymph nodes of multiple sites: Secondary | ICD-10-CM

## 2018-07-26 DIAGNOSIS — H9190 Unspecified hearing loss, unspecified ear: Secondary | ICD-10-CM

## 2018-07-26 LAB — COMPREHENSIVE METABOLIC PANEL
ALT: 21 U/L (ref 0–44)
AST: 29 U/L (ref 15–41)
Albumin: 2.8 g/dL — ABNORMAL LOW (ref 3.5–5.0)
Alkaline Phosphatase: 77 U/L (ref 38–126)
Anion gap: 5 (ref 5–15)
BUN: 27 mg/dL — ABNORMAL HIGH (ref 8–23)
CO2: 25 mmol/L (ref 22–32)
Calcium: 9.1 mg/dL (ref 8.9–10.3)
Chloride: 106 mmol/L (ref 98–111)
Creatinine, Ser: 1.05 mg/dL — ABNORMAL HIGH (ref 0.44–1.00)
GFR calc Af Amer: >60 mL/min (ref >60)
GFR calc non Af Amer: 52 mL/min — ABNORMAL LOW (ref >60)
Glucose, Bld: 101 mg/dL — ABNORMAL HIGH (ref 70–99)
Potassium: 4 mmol/L (ref 3.5–5.1)
Sodium: 136 mmol/L (ref 135–145)
Total Bilirubin: 0.9 mg/dL (ref 0.3–1.2)
Total Protein: 6.4 g/dL — ABNORMAL LOW (ref 6.5–8.1)

## 2018-07-26 LAB — CBC WITH DIFFERENTIAL/PLATELET
Abs Immature Granulocytes: 0.04 10*3/uL (ref 0.00–0.07)
Basophils Absolute: 0.1 10*3/uL (ref 0.0–0.1)
Basophils Relative: 1 %
Eosinophils Absolute: 0.3 10*3/uL (ref 0.0–0.5)
Eosinophils Relative: 3 %
HCT: 32.1 % — ABNORMAL LOW (ref 36.0–46.0)
Hemoglobin: 10.6 g/dL — ABNORMAL LOW (ref 12.0–15.0)
Immature Granulocytes: 0 %
Lymphocytes Relative: 13 %
Lymphs Abs: 1.2 10*3/uL (ref 0.7–4.0)
MCH: 28.2 pg (ref 26.0–34.0)
MCHC: 33 g/dL (ref 30.0–36.0)
MCV: 85.4 fL (ref 80.0–100.0)
Monocytes Absolute: 1.3 10*3/uL — ABNORMAL HIGH (ref 0.1–1.0)
Monocytes Relative: 14 %
Neutro Abs: 6.7 10*3/uL (ref 1.7–7.7)
Neutrophils Relative %: 69 %
Platelets: 347 10*3/uL (ref 150–400)
RBC: 3.76 MIL/uL — ABNORMAL LOW (ref 3.87–5.11)
RDW: 18.2 % — ABNORMAL HIGH (ref 11.5–15.5)
WBC: 9.7 10*3/uL (ref 4.0–10.5)
nRBC: 0 % (ref 0.0–0.2)

## 2018-07-26 LAB — LACTATE DEHYDROGENASE: LDH: 126 U/L (ref 98–192)

## 2018-07-26 LAB — FERRITIN: Ferritin: 23 ng/mL (ref 11–307)

## 2018-07-26 LAB — URIC ACID: Uric Acid, Serum: 3.7 mg/dL (ref 2.5–7.1)

## 2018-07-26 LAB — TSH: TSH: 1.765 u[IU]/mL (ref 0.350–4.500)

## 2018-07-26 MED ORDER — HEPARIN SOD (PORK) LOCK FLUSH 100 UNIT/ML IV SOLN
500.0000 [IU] | Freq: Once | INTRAVENOUS | Status: AC
  Administered 2018-07-26: 500 [IU] via INTRAVENOUS
  Filled 2018-07-26: qty 5

## 2018-07-26 MED ORDER — HYDROCODONE-ACETAMINOPHEN 5-325 MG PO TABS: 1.0000 | ORAL_TABLET | Freq: Four times a day (QID) | ORAL | 0 refills | Status: DC | PRN

## 2018-07-26 MED ORDER — SODIUM CHLORIDE 0.9 % IV SOLN
200.0000 mg | Freq: Once | INTRAVENOUS | Status: AC
  Administered 2018-07-26: 200 mg via INTRAVENOUS
  Filled 2018-07-26: qty 8

## 2018-07-26 MED ORDER — SODIUM CHLORIDE 0.9 % IV SOLN
Freq: Once | INTRAVENOUS | Status: AC
  Administered 2018-07-26: 12:00:00 via INTRAVENOUS
  Filled 2018-07-26: qty 250

## 2018-07-26 MED ORDER — SODIUM CHLORIDE 0.9% FLUSH
10.0000 mL | INTRAVENOUS | Status: DC | PRN
  Administered 2018-07-26: 10 mL via INTRAVENOUS
  Filled 2018-07-26: qty 10

## 2018-07-26 MED ORDER — ONDANSETRON HCL 4 MG PO TABS
8.0000 mg | ORAL_TABLET | Freq: Once | ORAL | Status: AC
  Administered 2018-07-26: 8 mg via ORAL
  Filled 2018-07-26: qty 2

## 2018-07-26 NOTE — Progress Notes (Signed)
Toni Griffith Clinic day:  07/26/18  Chief Complaint: Toni Griffith is a 77 y.o. 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  HPI:  The patient was last seen in the medical oncology clinic on 07/05/2018.  At that time, she was doing well.  She denied any side effects from treatment.  She denied any B symptoms or respiratory symptoms.  Exam revealed no adenopathy or hepatosplenomegaly.  Blood pressure was slightly low today.  Hemoglobin was 10.4.  She received cycle #2 pembrolizumab.  During the interim, she has felt "good- A-OK".  She continues to have pain in her knees.  She notes that she got sick in her stomach for 2 1/2 days.  Her boyfriend  alos got sick.  She currently denies any nausea, vomiting or diarrhea.  She notes a little neuropathy in her feet.   Past Medical History:  Diagnosis Date  . Arthritis   . Cancer [facility])    lymphoma-right hip  . Deaf   . GERD (gastroesophageal reflux disease)   . Hypertension    NOT ON MED AT PRESENT    Past Surgical History:  Procedure Laterality Date  . CARDIAC SURGERY  1993  . CORONARY ARTERY BYPASS GRAFT     DOUBLE  . CYST REMOVAL HAND Right   . ENDOBRONCHIAL ULTRASOUND N/A 05/30/2018   Procedure: ENDOBRONCHIAL ULTRASOUND;  Surgeon: Toni Pita, Griffith;  Location: [facility] ORS;  Service: Cardiopulmonary;  Laterality: N/A;  . PERIPHERAL VASCULAR CATHETERIZATION N/A 04/06/2016   Procedure: Toni Griffith Cath Insertion;  Surgeon: Toni Huxley, Griffith;  Location: [facility];  Service: Cardiovascular;  Laterality: N/A;    Family History  Problem Relation Age of Onset  . Lung cancer Mother   . Lung cancer Father   . Diabetes Maternal Uncle   . Myasthenia gravis Maternal Grandmother   . Leukemia Maternal Grandfather     Social History:  reports that she quit smoking about 2 years ago. Her smoking use included  cigarettes. She has a 80.00 pack-year smoking history. She has never used smokeless tobacco. She reports current alcohol use. She reports that she does not use drugs.  She smoked 1 1/2 packs per day for 20 years (30 pack year smoking history) until 10/2015.  She is hearing impaired (deaf).  She lives in [place] with her boyfriend.  Her daughter lives in [place].  Her daughter, Toni Griffith's contact number is [phone].  She is living with her son.  The patient is accompanied her daughter and the interpreter.  Allergies: No Known Allergies  Current Medications: Current Outpatient Medications  Medication Sig Dispense Refill  . acetaminophen (TYLENOL) 500 MG tablet Take 500 mg by mouth every 6 (six) hours as needed for moderate pain.     Marland Kitchen allopurinol (ZYLOPRIM) 300 MG tablet TAKE 1 TABLET BY MOUTH EVERY DAY (Patient taking differently: Take 300 mg by mouth daily. ) 90 tablet 1  . aspirin EC 81 MG tablet Take 81 mg by mouth daily.    Marland Kitchen b complex vitamins tablet Take 1 tablet by mouth daily.    . Cholecalciferol (VITAMIN D3) 2000 units capsule Take 2,000 Units by mouth daily.    . diclofenac sodium (VOLTAREN) 1 % GEL Apply 2 g topically 3 (three) times daily as needed (left knee). (Patient taking differently: Apply 2 g topically 2 (two) times daily. ) 1 Tube  1  . FLUoxetine (PROZAC) 40 MG capsule Take 40 mg by mouth daily.     Marland Kitchen gabapentin (NEURONTIN) 100 MG capsule Take 2 capsules (200 mg total) by mouth at bedtime. 60 capsule 3  . HYDROcodone-acetaminophen (NORCO) 5-325 MG tablet Take 1 tablet by mouth every 6 (six) hours as needed for moderate pain. 30 tablet 0  . lidocaine-prilocaine (EMLA) cream Apply cream 1 hour before chemotherapy treatment and place small peive of saran wrap over cream to protect clothing 30 g 3  . Magnesium 250 MG TABS Take 1 tablet by mouth daily.    . ondansetron (ZOFRAN ODT) 4 MG disintegrating tablet Take 1 tablet (4 mg total) by mouth every 8 (eight) hours as needed for  nausea or vomiting. 30 tablet 1  . pantoprazole (PROTONIX) 40 MG tablet Take 40 mg by mouth daily.     . vitamin B-12 (CYANOCOBALAMIN) 500 MCG tablet Take 500 mcg by mouth daily.    . vitamin E 400 UNIT capsule Take 400 Units by mouth daily.     Marland Kitchen atorvastatin (LIPITOR) 80 MG tablet Take 80 mg by mouth every evening.      No current facility-administered medications for this visit.    Facility-Administered Medications Ordered in Other Visits  Medication Dose Route Frequency Provider Last Rate Last Dose  . heparin lock flush 100 unit/mL  500 Units Intravenous Once Toni Griffith      . heparin lock flush 100 unit/mL  500 Units Intravenous Once Toni Griffith      . sodium chloride flush (NS) 0.9 % injection 10 mL  10 mL Intravenous PRN Toni Griffith Griffith, Griffith   10 mL at 04/05/17 1034  . sodium chloride flush (NS) 0.9 % injection 10 mL  10 mL Intravenous PRN Toni Griffith        Review of Systems  Constitutional: Positive for weight loss (8 pounds; ? real as down 2# since 06/14/2018). Negative for chills, diaphoresis, fever and malaise/fatigue.       Feels "A- OK".  HENT: Positive for hearing loss (deaf). Negative for congestion, nosebleeds, sinus pain and sore throat.   Eyes: Negative.  Negative for blurred vision, double vision, photophobia, pain, discharge and redness.  Respiratory: Negative.  Negative for cough, sputum production and shortness of breath.   Cardiovascular: Negative.  Negative for chest pain, palpitations, orthopnea, leg swelling and PND.  Gastrointestinal: Negative.  Negative for abdominal pain, blood in stool, constipation, diarrhea, heartburn, melena, nausea and vomiting.  Genitourinary: Negative.  Negative for dysuria, frequency, hematuria and urgency.  Musculoskeletal: Positive for joint pain (knees). Negative for back pain, falls, myalgias and neck pain.  Skin: Negative.  Negative for itching and rash.  Neurological: Positive for sensory  change (grade I neuropathy in feet). Negative for dizziness, tremors, focal weakness, weakness and headaches.  Endo/Heme/Allergies: Negative.  Does not bruise/bleed easily.  Psychiatric/Behavioral: Negative for depression, memory loss and suicidal ideas. The patient is not nervous/anxious and does not have insomnia.   All other systems reviewed and are negative.  Performance status (ECOG): 1-2  Vital Signs BP 108/66 (BP Location: Left Arm, Patient Position: Sitting)   Pulse 80   Temp 97.6 F (36.4 Griffith) (Tympanic)   Ht 5' (1.524 m)   Wt 163 lb (73.9 kg)   SpO2 96%   BMI 31.83 kg/m   Physical Exam  Constitutional: She is oriented to person, place, and time and well-developed, well-nourished, and in no distress. No distress.  HENT:  Head: Normocephalic and atraumatic.  Mouth/Throat: Oropharynx is clear and moist. No oropharyngeal exudate.  Short gray hair.  Dentures.  Eyes: Pupils are equal, round, and reactive to light. Conjunctivae and EOM are normal. No scleral icterus.  Hazel eyes.  Neck: Normal range of motion. Neck supple. No JVD present.  Cardiovascular: Normal rate, regular rhythm and normal heart sounds. Exam reveals no gallop and no friction rub.  No murmur heard. Pulmonary/Chest: Effort normal and breath sounds normal. No respiratory distress. She has no wheezes. She has no rales.  Abdominal: Soft. Bowel sounds are normal. She exhibits no distension and no mass. There is no abdominal tenderness. There is no rebound and no guarding.  Musculoskeletal: Normal range of motion.        General: No tenderness or edema.  Lymphadenopathy:    She has no cervical adenopathy.    She has no axillary adenopathy.       Right: No supraclavicular adenopathy present.       Left: No supraclavicular adenopathy present.  Neurological: She is alert and oriented to person, place, and time.  Skin: Skin is warm and dry. No rash noted. She is not diaphoretic. No erythema. No pallor.  Psychiatric:  Mood, affect and judgment normal.  Nursing note and vitals reviewed.   Results for orders placed or performed in visit on 07/26/18 (from the past 168 hour(s))  Comprehensive metabolic panel   Collection Time: 07/26/18  9:59 AM  Result Value Ref Range   Sodium 136 135 - 145 mmol/L   Potassium 4.0 3.5 - 5.1 mmol/L   Chloride 106 98 - 111 mmol/L   CO2 25 22 - 32 mmol/L   Glucose, Bld 101 (H) 70 - 99 mg/dL   BUN 27 (H) 8 - 23 mg/dL   Creatinine, Ser 1.05 (H) 0.44 - 1.00 mg/dL   Calcium 9.1 8.9 - 10.3 mg/dL   Total Protein 6.4 (L) 6.5 - 8.1 g/dL   Albumin 2.8 (L) 3.5 - 5.0 g/dL   AST 29 15 - 41 U/L   ALT 21 0 - 44 U/L   Alkaline Phosphatase 77 38 - 126 U/L   Total Bilirubin 0.9 0.3 - 1.2 mg/dL   GFR calc non Af Amer 52 (L) >60 mL/min   GFR calc Af Amer >60 >60 mL/min   Anion gap 5 5 - 15  CBC with Differential   Collection Time: 07/26/18  9:59 AM  Result Value Ref Range   WBC 9.7 4.0 - 10.5 K/uL   RBC 3.76 (L) 3.87 - 5.11 MIL/uL   Hemoglobin 10.6 (L) 12.0 - 15.0 g/dL   HCT 32.1 (L) 36.0 - 46.0 %   MCV 85.4 80.0 - 100.0 fL   MCH 28.2 26.0 - 34.0 pg   MCHC 33.0 30.0 - 36.0 g/dL   RDW 18.2 (H) 11.5 - 15.5 %   Platelets 347 150 - 400 K/uL   nRBC 0.0 0.0 - 0.2 %   Neutrophils Relative % 69 %   Neutro Abs 6.7 1.7 - 7.7 K/uL   Lymphocytes Relative 13 %   Lymphs Abs 1.2 0.7 - 4.0 K/uL   Monocytes Relative 14 %   Monocytes Absolute 1.3 (H) 0.1 - 1.0 K/uL   Eosinophils Relative 3 %   Eosinophils Absolute 0.3 0.0 - 0.5 K/uL   Basophils Relative 1 %   Basophils Absolute 0.1 0.0 - 0.1 K/uL   Immature Granulocytes 0 %   Abs Immature Granulocytes 0.04 0.00 - 0.07 K/uL  Lactate dehydrogenase   Collection Time: 07/26/18  9:59 AM  Result Value Ref Range   LDH 126 98 - 192 U/L     Admissions: 02/20/2016 - 02/26/2016:  [facility]:  She had renal failure and hypercalcemia. She was treated with IVF, calcitonin and Zometa.The patient has a history of coronary artery  disease s/p CABG in 1993.  She has no history of heart failure.  01/20/2018 - 01/23/2018:  [facility].  She presented with nausea, vomiting, diarrhea and generalized weakness. Work-up showed hypokalemia, hypomagnesemia, dehydration and UTI.  She was treated with IV fluids and empiric IV antibiotics. Potassium and magnesium were repleted. Urine culture showed E. coli.  She was discharged on Augmentin. She was switched from HCTZ to chorthalidone. 02/01/2018 - 02/03/2018:  [facility] with hyponatremia.   Radiology studies: 02/20/2016:  Chest, abdomen and pelvic CT revealed a 3.7 cm subpleural mass within the left lower lobe, a 1.1 cm right lower lobe subpleural nodule , 9 mm RUL nodule, and 0.4 cm lingular nodule.  Between the left second, third, and fourth ribs there was 1.9 cm soft tissue mass. There was a 2.5 cm low-attenuation nodule in the left lobe of the thyroid.   There were prominent left axillary nodes (up to 1.0 cm).   There was no definite mediastinal adenopathy. There was a 5.5 x 4.6 cm cystic lesion in the left hepatic lobe.  There was a 3.1 cm nodule in the right adrenal gland. There was a 1.2 cm right retrocaval lymph node with additional prominent subcentimeter retroperitoneal nodes. There was 1.0 cm left common iliac node. There was a 6.2 x 5.2 cm soft tissue mass within the right ilium.  02/21/2016:  Head MRI revealed no evidence of malignancy.  02/21/2016:  Thyroid ultrasound revealed a 2.4 cm solid nodule in the inferior aspect of the left lobe 03/03/2016:  PET scan revealed a hypermetabolic left lower lobe mass, bilateral pleural and pulmonary parenchymal nodular metastasis, a large pleural lesion invading the anterior left chest wall.   There was activity in the superior left ocular orbit.  There was a large lesion centered in the right iliac wing with soft tissue extension.  There were hypermetabolic right external iliac lymph nodes 06/06/2016:  PET scan  revealed significant interval response to therapy.  There was significant decrease in size and FDG uptake associated with left lower lobe lung lesion.  There was resolution of left-sided pleural base metastasis and left axillary and anterior mediastinal nodal metastasis.  There was resolution of previous hypermetabolic soft tissue metastasis/nodal metastasis posterior to the IVC.  There was marked improvement in hypermetabolic metastasis involving the right iliac wing.  There was persistent right upper lobe lung nodule which exhibits intense radiotracer uptake which may represent a focus of metastatic disease or synchronous primary lung neoplasm. 06/01/2017:  PET scan revealed no new or progressive findings.  The 9 mm RUL pulmonary nodule remained hypermetabolic (SUV 37.1, previously 7.3) and no change in size.  The index lesion in the posterior left lower lobe was minimally smaller on CT imaging with very low level FDG uptake (SUV 1.6).  There was no change in the appearance at site of previous disease at the right iliac wing (SUV 1.9 compared to 2.6 previously). 01/29/2017:  PET scan revealed interval progression of hypermetabolic nodules in the right upper (13 mm compared to 9 mm; SUV 10) and left lower lobes (26 mm compared to 22 mm; SUV 7.5).  There was new hypermetabolic focus of activity along the anterior left pleura although no underlying pleural or lung mass was evident. 09/18/2017:  PET scan revealed a mixed appearance, with marked improvement in the prior 2 pulmonary nodules; but with new Deauville 5 left common iliac lymph node enlargement; new Deauville 5 right paraspinal lesion; and new Deauville 4 left infrahilar nodal activity.  There was a focus of accentuated metabolic activity which had been present along the right lower submandibular gland but currently seemed to project over the subcutaneous tissues without CT correlate. Current maximum SUV 10.5, previously 9.7.  There was increased  accentuated activity along the left lateral spurring at L2-3. While typically such activity is inflammatory due to spurring, this had increased significantly since the prior exam and merit surveillance. There was a new 8 by 5 mm right upper lobe pulmonary nodule, Deauville 2, which also merits surveillance. 01/29/2018:  PET scan revealed persistent and progressive areas of hypermetabolism within the right submandibular gland, lingula of the left lung, left common iliac nodal chain, and right lumbar paraspinal soft tissues. Findings were compatible with Deauville criteria 4 and 5.  There was a new nodule is identified within the lingula which demonstrated intense uptake compatible with Deauville criteria 5.  05/14/2018:  Chest, abdomen, and pelvic CT revealed enlarging mediastinal adenopathy and right upper lobe nodule, indicative of disease progression. Necrotic lingular nodules were grossly stable in size with developing surrounding collapse/consolidation, possibly treatment related.  There was slight decrease in size of a left external iliac lymph node and right lumbar paraspinal soft tissue lesion.  There was a tiny left pleural effusion. 06/24/2018:  Head MRI revealed no evidence of intracranial metastasis.   Assessment:  Carlisia Geno is a 77 y.o. female with stage IVB B-cell lymphoma, unclassifiable, with features intermediate between diffuse large B-cell lymphoma and classical Hodgkin's lymphoma ("gray zone" lymphoma).  She underwent right iliac wing bone/bone marrow biopsy on 03/08/2016.  PET scan on 03/03/2016 revealed a hypermetabolic left lower lobe mass, bilateral pleural and pulmonary parenchymal nodular metastasis, a large pleural lesion invading the anterior left chest wall.   There was activity in the superior left ocular orbit.  There was a large lesion centered in the right iliac wing with soft tissue extension.  There were hypermetabolic right external iliac lymph nodes  Echo on  04/04/2016 revealed an EF of 55-60%.  Hepatitis B and Griffith testing was negative on 03/30/2016.   G6PD assay was normal.  She declined LP with MTX prophylaxis.  She received 6 cycles of mini-RCHOP (04/07/2016 - 07/21/2016).  She has had some transient fingertip numbness.  She received radiation to the right iliac wing,  from 10/11/2016 - 11/01/2016.  She received 3000 cGy over 3 weeks.  PET scan on 01/29/2017 revealed interval progression of hypermetabolic nodules in the right upper (13 mm compared to 9 mm; SUV 10) and left lower lobes (26 mm compared to 22 mm; SUV 7.5).  There was new hypermetabolic focus of activity along the anterior left pleura although no underlying pleural or lung mass was evident.  She has squamous cell lung cancer.  Foundation One on the lung biopsy on 03/15/2018 revealed MS-stable, tumor mutational burden 5 Muts/Mb, AKT2 amplification, CCNE1 amplification, CREBBP R1446L, KDR amplification, KIT amplification, PDGFRA amplification, PTEN loss exons 2-4, and KT62 splice site 563+8L>H. Marland Kitchen  There were no alterations in EGFR, KRAS, RET, ALK, MET, ERBB2, BRAF, ROS1.  PDL-1 IHC analysis revealed a TPS score of 50%.  CT guided  left lower lobe nodule biopsy on 02/19/2017 confirmed squamous cell carcinoma.  She underwent SBRT 03/26/2017 - 04/09/2017.  She received 5000 cGy in 5 fractions to the left lower lobe lesion.  She underwent SBRT to a RUL lesion from 05/23/2017 - 06/18/2017.  CT guided left upper lobe nodule biopsy on 02/20/2018 revealed squamous cell lung cancer.  She received SBRT from 04/09/2018 - 04/15/2018.   She received 6000 cGy in 5 fractions.   Bronchoscopy on 05/30/2018 revealed no endobronchial lesions.  The 2 cm pre-carinal lymph node was biopsied.  Pathology revealed metastatic non-small cell carcinoma, favor squamous cell carcinoma.  PET scan on 09/18/2017 revealed a mixed appearance, with marked improvement in the prior 2 pulmonary nodules; but with new Deauville 5  left common iliac lymph node enlargement; new Deauville 5 right paraspinal lesion; and new Deauville 4 left infrahilar nodal activity.  There was a focus of accentuated metabolic activity which had been present along the right lower submandibular gland but currently seemed to project over the subcutaneous tissues without CT correlate. Current maximum SUV 10.5, previously 9.7. There was increased accentuated activity along the left lateral spurring at L2-3. While typically such activity is inflammatory due to spurring, this had increased significantly since the prior exam and merit surveillance. There was a new 8 by 5 mm right upper lobe pulmonary nodule, Deauville 2.  CT guided right paraspinal lesion biopsy on 10/16/2017 was compatible with involvement by the patient's previously diagnosed lymphoma.  The case was referred to [facility] hematopathology.  The neoplastic cells were largely CD30 negative.  Repeat CD30 immunohistochemistry faintly stained > 10% of the large atypical cells. CD20 stained the scattered large atypical cells and few small cells.   She received 3 cycles of brentuximab vedotin (11/23/2017 - 01/11/2018).  PET scan on 01/29/2018 revealed persistent and progressive areas of hypermetabolism within the right submandibular gland, lingula of the left lung, left common iliac nodal chain, and right lumbar paraspinal soft tissues. Findings were compatible with Deauville criteria 4 and 5.  There was a new nodule is identified within the lingula which demonstrated intense uptake compatible with Deauville criteria 5.   Chest, abdomen, and pelvic CT on 05/14/2018 revealed enlarging mediastinal adenopathy and right upper lobe nodule, indicative of disease progression. Necrotic lingular nodules were grossly stable in size with developing surrounding collapse/consolidation, possibly treatment related.  There was slight decrease in size of a left external iliac lymph node and right lumbar paraspinal soft  tissue lesion.  There was a tiny left pleural effusion.  Head MRI on 06/24/2018 revealed no evidence of intracranial metastasis.  She is s/p 2 cycles of pembrolizumab (06/14/2018  - 07/05/2018).  She has iron deficiency anemia.  Ferritin has been followed:  14 on 05/06/2018 and 23 on 07/26/2018.  She denies any bleeding.  She has a grade I neuropathy.  B12 and folate were normal on 12/06/2017.  She is deaf and requires an interpreter.  Symptomatically, she is doing well.  She denies any B symptoms.  She denies any respiratory symptoms.  Exam is normal.  Plan: 1.   Labs today:  CBC with diff, CMP, TSH, LDH, uric acid, ferritin. 2.   Pearline Cables zone lymphoma Tolerating treatment well. Cycle #3 pembrolizumab today. Discuss symptom management.  She has antiemetics and pain medications at home to use on a prn bases.  Interventions are adequate.    3.   Squamous cell lung cancer  Tolerating treatment well.  Cycle #3 pembrolizumab today.  Discuss plans for  future cycles every 3 weeks (01/17, 02/07, 02/28, etc) and restaging scans before 09/06/2018.  4.   Chemotherapy-induced neuropathy Continue low-dose gabapentin.  6.   Normocytic anemia Hemoglobin 10.6, hematocrit 32.1, MCV 85.4, and platelets 347,000. Ferritin 23 today. Continue iron rich foods. Discuss IV iron if hemoglobin and ferritin decrease. 7.   B12 deficiency Patient on oral B12 500 mcg daily.  8.   Pain:  Patient requesting refill of pain medications.  Discuss refill x 1 of Norco 5/325 mg (1 tablet po q 6 hours prn pain; dis #30).  Additional refill per patient's PCP. 9.   RTC on 08/16/2018 for Griffith assessment, labs (CBC with diff, CMP, TSH, LDH, uric acid, ferritin), and cycle #4 pembrolizumab.    Toni Griffith  07/26/18, 11:14 AM

## 2018-07-26 NOTE — Progress Notes (Signed)
Patient stated that she had been nauseated and vomited once but doing better now. Patient also stated that she would like a refill on her Hydrocodone-Acetaminophen sent to [facility] in [place] please.

## 2018-08-07 ENCOUNTER — Other Ambulatory Visit: Payer: Self-pay | Admitting: Urgent Care

## 2018-08-07 DIAGNOSIS — E876 Hypokalemia: Secondary | ICD-10-CM

## 2018-08-07 DIAGNOSIS — C8198 Hodgkin lymphoma, unspecified, lymph nodes of multiple sites: Secondary | ICD-10-CM

## 2018-08-07 DIAGNOSIS — C8580 Other specified types of non-Hodgkin lymphoma, unspecified site: Principal | ICD-10-CM

## 2018-08-07 DIAGNOSIS — R911 Solitary pulmonary nodule: Secondary | ICD-10-CM

## 2018-08-07 DIAGNOSIS — C8330 Diffuse large B-cell lymphoma, unspecified site: Secondary | ICD-10-CM

## 2018-08-07 DIAGNOSIS — C7951 Secondary malignant neoplasm of bone: Secondary | ICD-10-CM

## 2018-08-16 ENCOUNTER — Inpatient Hospital Stay: Payer: Medicare HMO | Admitting: Hematology and Oncology

## 2018-08-16 ENCOUNTER — Ambulatory Visit
Admission: RE | Admit: 2018-08-16 | Discharge: 2018-08-16 | Disposition: A | Discharge status: Home / Self Care | Payer: Medicare HMO | Source: Ambulatory Visit | Attending: Radiation Oncology | Admitting: Radiation Oncology

## 2018-08-16 ENCOUNTER — Ambulatory Visit
Admission: RE | Admit: 2018-08-16 | Discharge: 2018-08-16 | Disposition: A | Discharge status: Home / Self Care | Payer: Medicare HMO | Source: Ambulatory Visit | Attending: Hematology and Oncology | Admitting: Hematology and Oncology

## 2018-08-16 ENCOUNTER — Telehealth: Payer: Self-pay

## 2018-08-16 ENCOUNTER — Inpatient Hospital Stay: Payer: Medicare HMO

## 2018-08-16 ENCOUNTER — Inpatient Hospital Stay: Payer: Medicare HMO | Attending: Hematology and Oncology

## 2018-08-16 ENCOUNTER — Encounter: Payer: Self-pay | Admitting: Hematology and Oncology

## 2018-08-16 VITALS — BP 126/68 | HR 88 | Temp 97.7°F | Resp 20 | Wt 159.8 lb

## 2018-08-16 DIAGNOSIS — Z87891 Personal history of nicotine dependence: Secondary | ICD-10-CM | POA: Insufficient documentation

## 2018-08-16 DIAGNOSIS — C3490 Malignant neoplasm of unspecified part of unspecified bronchus or lung: Secondary | ICD-10-CM

## 2018-08-16 DIAGNOSIS — M1611 Unilateral primary osteoarthritis, right hip: Secondary | ICD-10-CM

## 2018-08-16 DIAGNOSIS — Z951 Presence of aortocoronary bypass graft: Secondary | ICD-10-CM | POA: Diagnosis not present

## 2018-08-16 DIAGNOSIS — C3411 Malignant neoplasm of upper lobe, right bronchus or lung: Secondary | ICD-10-CM | POA: Diagnosis not present

## 2018-08-16 DIAGNOSIS — C8580 Other specified types of non-Hodgkin lymphoma, unspecified site: Secondary | ICD-10-CM | POA: Insufficient documentation

## 2018-08-16 DIAGNOSIS — C8518 Unspecified B-cell lymphoma, lymph nodes of multiple sites: Secondary | ICD-10-CM

## 2018-08-16 DIAGNOSIS — D509 Iron deficiency anemia, unspecified: Secondary | ICD-10-CM | POA: Insufficient documentation

## 2018-08-16 DIAGNOSIS — M1711 Unilateral primary osteoarthritis, right knee: Secondary | ICD-10-CM

## 2018-08-16 DIAGNOSIS — Z923 Personal history of irradiation: Secondary | ICD-10-CM | POA: Insufficient documentation

## 2018-08-16 DIAGNOSIS — Z7982 Long term (current) use of aspirin: Secondary | ICD-10-CM | POA: Diagnosis not present

## 2018-08-16 DIAGNOSIS — C771 Secondary and unspecified malignant neoplasm of intrathoracic lymph nodes: Secondary | ICD-10-CM | POA: Insufficient documentation

## 2018-08-16 DIAGNOSIS — R195 Other fecal abnormalities: Secondary | ICD-10-CM | POA: Diagnosis not present

## 2018-08-16 DIAGNOSIS — Z791 Long term (current) use of non-steroidal anti-inflammatories (NSAID): Secondary | ICD-10-CM | POA: Insufficient documentation

## 2018-08-16 DIAGNOSIS — M898X5 Other specified disorders of bone, thigh: Secondary | ICD-10-CM

## 2018-08-16 DIAGNOSIS — M25561 Pain in right knee: Secondary | ICD-10-CM

## 2018-08-16 DIAGNOSIS — K219 Gastro-esophageal reflux disease without esophagitis: Secondary | ICD-10-CM | POA: Diagnosis not present

## 2018-08-16 DIAGNOSIS — Z79899 Other long term (current) drug therapy: Secondary | ICD-10-CM | POA: Diagnosis not present

## 2018-08-16 DIAGNOSIS — I10 Essential (primary) hypertension: Secondary | ICD-10-CM | POA: Insufficient documentation

## 2018-08-16 DIAGNOSIS — C3432 Malignant neoplasm of lower lobe, left bronchus or lung: Secondary | ICD-10-CM | POA: Diagnosis present

## 2018-08-16 DIAGNOSIS — R140 Abdominal distension (gaseous): Secondary | ICD-10-CM

## 2018-08-16 DIAGNOSIS — Z5112 Encounter for antineoplastic immunotherapy: Secondary | ICD-10-CM | POA: Insufficient documentation

## 2018-08-16 DIAGNOSIS — C8590 Non-Hodgkin lymphoma, unspecified, unspecified site: Secondary | ICD-10-CM

## 2018-08-16 DIAGNOSIS — G63 Polyneuropathy in diseases classified elsewhere: Secondary | ICD-10-CM

## 2018-08-16 DIAGNOSIS — C801 Malignant (primary) neoplasm, unspecified: Secondary | ICD-10-CM

## 2018-08-16 DIAGNOSIS — M25551 Pain in right hip: Secondary | ICD-10-CM

## 2018-08-16 DIAGNOSIS — C3492 Malignant neoplasm of unspecified part of left bronchus or lung: Secondary | ICD-10-CM

## 2018-08-16 LAB — CBC WITH DIFFERENTIAL/PLATELET
Abs Immature Granulocytes: 0.02 10*3/uL (ref 0.00–0.07)
Basophils Absolute: 0.1 10*3/uL (ref 0.0–0.1)
Basophils Relative: 1 %
Eosinophils Absolute: 0.3 10*3/uL (ref 0.0–0.5)
Eosinophils Relative: 3 %
HCT: 33.9 % — ABNORMAL LOW (ref 36.0–46.0)
Hemoglobin: 11.3 g/dL — ABNORMAL LOW (ref 12.0–15.0)
Immature Granulocytes: 0 %
Lymphocytes Relative: 13 %
Lymphs Abs: 1 10*3/uL (ref 0.7–4.0)
MCH: 29.2 pg (ref 26.0–34.0)
MCHC: 33.3 g/dL (ref 30.0–36.0)
MCV: 87.6 fL (ref 80.0–100.0)
Monocytes Absolute: 1.2 10*3/uL — ABNORMAL HIGH (ref 0.1–1.0)
Monocytes Relative: 17 %
Neutro Abs: 4.8 10*3/uL (ref 1.7–7.7)
Neutrophils Relative %: 66 %
Platelets: 360 10*3/uL (ref 150–400)
RBC: 3.87 MIL/uL (ref 3.87–5.11)
RDW: 20.9 % — ABNORMAL HIGH (ref 11.5–15.5)
WBC: 7.3 10*3/uL (ref 4.0–10.5)
nRBC: 0 % (ref 0.0–0.2)

## 2018-08-16 LAB — LACTATE DEHYDROGENASE: LDH: 127 U/L (ref 98–192)

## 2018-08-16 LAB — COMPREHENSIVE METABOLIC PANEL
ALT: 21 U/L (ref 0–44)
AST: 30 U/L (ref 15–41)
Albumin: 2.8 g/dL — ABNORMAL LOW (ref 3.5–5.0)
Alkaline Phosphatase: 77 U/L (ref 38–126)
Anion gap: 6 (ref 5–15)
BUN: 19 mg/dL (ref 8–23)
CO2: 25 mmol/L (ref 22–32)
Calcium: 9.3 mg/dL (ref 8.9–10.3)
Chloride: 106 mmol/L (ref 98–111)
Creatinine, Ser: 0.9 mg/dL (ref 0.44–1.00)
GFR calc Af Amer: >60 mL/min (ref >60)
GFR calc non Af Amer: >60 mL/min (ref >60)
Glucose, Bld: 104 mg/dL — ABNORMAL HIGH (ref 70–99)
Potassium: 3.7 mmol/L (ref 3.5–5.1)
Sodium: 137 mmol/L (ref 135–145)
Total Bilirubin: 1 mg/dL (ref 0.3–1.2)
Total Protein: 6.7 g/dL (ref 6.5–8.1)

## 2018-08-16 LAB — URIC ACID: Uric Acid, Serum: 3.6 mg/dL (ref 2.5–7.1)

## 2018-08-16 LAB — TSH: TSH: 2.29 u[IU]/mL (ref 0.350–4.500)

## 2018-08-16 LAB — FERRITIN: Ferritin: 38 ng/mL (ref 11–307)

## 2018-08-16 MED ORDER — ONDANSETRON HCL 4 MG PO TABS
8.0000 mg | ORAL_TABLET | Freq: Once | ORAL | Status: AC
  Administered 2018-08-16: 8 mg via ORAL
  Filled 2018-08-16: qty 2

## 2018-08-16 MED ORDER — SODIUM CHLORIDE 0.9 % IV SOLN
200.0000 mg | Freq: Once | INTRAVENOUS | Status: AC
  Administered 2018-08-16: 200 mg via INTRAVENOUS
  Filled 2018-08-16: qty 8

## 2018-08-16 MED ORDER — SODIUM CHLORIDE 0.9% FLUSH
10.0000 mL | INTRAVENOUS | Status: DC | PRN
  Administered 2018-08-16: 10 mL via INTRAVENOUS
  Filled 2018-08-16: qty 10

## 2018-08-16 MED ORDER — HEPARIN SOD (PORK) LOCK FLUSH 100 UNIT/ML IV SOLN
500.0000 [IU] | Freq: Once | INTRAVENOUS | Status: AC
  Administered 2018-08-16: 500 [IU] via INTRAVENOUS
  Filled 2018-08-16: qty 5

## 2018-08-16 MED ORDER — SODIUM CHLORIDE 0.9 % IV SOLN
Freq: Once | INTRAVENOUS | Status: AC
  Administered 2018-08-16: 12:00:00 via INTRAVENOUS
  Filled 2018-08-16: qty 250

## 2018-08-16 NOTE — Telephone Encounter (Signed)
Spoke with daughter, Toni Griffith, regarding Ferritin levels. Daughter states understanding and denies any further questions or concerns.

## 2018-08-16 NOTE — Telephone Encounter (Signed)
-----   Message from Lequita Asal, MD sent at 08/16/2018  2:40 PM EST -----  Please call patient with ferritin level.  M ----- Message ----- From: Buel Ream, Lab In [place] Sent: 08/16/2018   9:58 AM EST To: Lequita Asal, MD

## 2018-08-16 NOTE — Progress Notes (Signed)
Radiation Oncology Follow up Note  Name: Toni Griffith   Date:   08/16/2018 MRN:  [id number] DOB: 1941/06/16    This 78 y.o. 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 She is currently on.pembrolizumaband tolerating that well. She specifically denies any areas of pain cough chest tightness.her most recent CT scans from October showed enlarging mediastinal adenopathy and right upper lobe nodule indicative disease progression.MRI of her brain showed no evidence of disease.  COMPLICATIONS OF TREATMENT: none  FOLLOW UP COMPLIANCE: keeps appointments   PHYSICAL EXAM:  There were no vitals taken for this visit. Wheelchair-bound female in NAD.Well-developed well-nourished patient in NAD. HEENT reveals PERLA, EOMI, discs not visualized.  Oral cavity is clear. No oral mucosal lesions are identified. Neck is clear without evidence of cervical or supraclavicular adenopathy. Lungs are clear to A&P. Cardiac examination is essentially unremarkable with regular rate and rhythm without murmur rub or thrill. Abdomen is benign with no organomegaly or masses noted. Motor sensory and DTR levels are equal and symmetric in the upper and lower extremities. Cranial nerves II through XII are grossly intact. Proprioception is intact. No peripheral adenopathy or edema is identified. No motor or sensory levels are noted. Crude visual fields are within normal range.  RADIOLOGY RESULTS: CT scans and MRI of brain all reviewed and compatible with the  above-stated findings  PLAN: at the present time she continues n immunotherapy under medical oncology's direction. I am going to turn follow-up care over to medical oncology at this time.I will be happy to reevaluate the patient any time should further palliative treatment be indicated. Patient family know to call with any concerns at any time.  I would like to take this opportunity to thank you for allowing me to participate in the care of your patient.Noreene Filbert, MD

## 2018-08-16 NOTE — Progress Notes (Signed)
Pt here for f/u. Reports increase in neuropathy in feet and hands. Also reports approx. 2 weeks ago started noticing black, tarry stools. Denies any abd. Pain/cramping. Reports some depression at times but denies SI.   Pt also reports pain in upper right thigh. States 2 days ago she felt a pop. Pain has decreased since episode.

## 2018-08-16 NOTE — Progress Notes (Signed)
[facility] day:  08/16/18  Chief Complaint: Toni Griffith is a 78 y.o. 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  HPI:  The patient was last seen in the medical oncology clinic on 07/26/2018.  At that time, she was doing well.  She denied any B symptoms.  She denied any respiratory symptoms.  Exam was normal.  Hemoglobin was 10.6, hematocrit 32.1, MCV 85.4, and platelets 347,000.  Ferritin was 23.  We discussed IV iron if hemoglobin and ferritin decreased.  She received cycle #3 pembrolizumab.  During the interim, she has done fairly well.  She denies any respiratory symptoms.  She denies any fevers or sweats.  She feels bloated if she eats too much.  Weight is down 4 pounds.  She has been on iron since 07/26/2018.  She describes one episode of dark stools 2 weeks ago.  She denies any further episodes or hematochezia.  She describes a pain in her right leg and feeling "a pop".  The neuropathy in her She states that she gets around in a walker.   Neuropathy in her feet is unchanged.   Past Medical History:  Diagnosis Date  . Arthritis   . Cancer [facility])    lymphoma-right hip  . Deaf   . GERD (gastroesophageal reflux disease)   . Hypertension    NOT ON MED AT PRESENT    Past Surgical History:  Procedure Laterality Date  . CARDIAC SURGERY  1993  . CORONARY ARTERY BYPASS GRAFT     DOUBLE  . CYST REMOVAL HAND Right   . ENDOBRONCHIAL ULTRASOUND N/A 05/30/2018   Procedure: ENDOBRONCHIAL ULTRASOUND;  Surgeon: Tyler Pita, MD;  Location: [facility] ORS;  Service: Cardiopulmonary;  Laterality: N/A;  . PERIPHERAL VASCULAR CATHETERIZATION N/A 04/06/2016   Procedure: Glori Luis Cath Insertion;  Surgeon: Algernon Huxley, MD;  Location: [facility];  Service: Cardiovascular;  Laterality: N/A;    Family History  Problem Relation Age of Onset  . Lung cancer  Mother   . Lung cancer Father   . Diabetes Maternal Uncle   . Myasthenia gravis Maternal Grandmother   . Leukemia Maternal Grandfather     Social History:  reports that she quit smoking about 2 years ago. Her smoking use included cigarettes. She has a 80.00 pack-year smoking history. She has never used smokeless tobacco. She reports current alcohol use. She reports that she does not use drugs.  She smoked 1 1/2 packs per day for 20 years (30 pack year smoking history) until 10/2015.  She is hearing impaired (deaf).  She lives in [place] with her boyfriend.  Her daughter lives in [place].  Her daughter, [name]'s contact number is [phone].  She is living with her son.  The patient is accompanied her son, daughter, and the interpreter.  Allergies: No Known Allergies  Current Medications: Current Outpatient Medications  Medication Sig Dispense Refill  . acetaminophen (TYLENOL) 500 MG tablet Take 500 mg by mouth every 6 (six) hours as needed for moderate pain.     Marland Kitchen allopurinol (ZYLOPRIM) 300 MG tablet TAKE 1 TABLET BY MOUTH EVERY DAY 90 tablet 0  . aspirin EC 81 MG tablet Take 81 mg by mouth daily.    Marland Kitchen atorvastatin (LIPITOR) 80 MG tablet Take 80 mg by mouth every evening.     . Cholecalciferol (VITAMIN D3) 2000 units capsule  Take 2,000 Units by mouth daily.    . ferrous sulfate 325 (65 FE) MG tablet Take 650 mg by mouth daily with breakfast.    . FLUoxetine (PROZAC) 40 MG capsule Take 40 mg by mouth daily.     Marland Kitchen gabapentin (NEURONTIN) 100 MG capsule Take 2 capsules (200 mg total) by mouth at bedtime. 60 capsule 3  . HYDROcodone-acetaminophen (NORCO) 5-325 MG tablet Take 1 tablet by mouth every 6 (six) hours as needed for moderate pain. 30 tablet 0  . lidocaine-prilocaine (EMLA) cream Apply cream 1 hour before chemotherapy treatment and place small peive of saran wrap over cream to protect clothing 30 g 3  . Magnesium 250 MG TABS Take 1 tablet by mouth daily.    . ondansetron (ZOFRAN  ODT) 4 MG disintegrating tablet Take 1 tablet (4 mg total) by mouth every 8 (eight) hours as needed for nausea or vomiting. 30 tablet 1  . pantoprazole (PROTONIX) 40 MG tablet Take 40 mg by mouth daily.     . vitamin E 400 UNIT capsule Take 400 Units by mouth daily.     Marland Kitchen b complex vitamins tablet Take 1 tablet by mouth daily.    . diclofenac sodium (VOLTAREN) 1 % GEL APPLY 2 G TOPICALLY 3 (THREE) TIMES DAILY AS NEEDED (LEFT KNEE). 100 g 1  . vitamin B-12 (CYANOCOBALAMIN) 500 MCG tablet Take 500 mcg by mouth daily.     No current facility-administered medications for this visit.    Facility-Administered Medications Ordered in Other Visits  Medication Dose Route Frequency Provider Last Rate Last Dose  . heparin lock flush 100 unit/mL  500 Units Intravenous Once Corcoran, Melissa C, MD      . sodium chloride flush (NS) 0.9 % injection 10 mL  10 mL Intravenous PRN Nolon Stalls C, MD   10 mL at 04/05/17 1034    Review of Systems  Constitutional: Positive for weight loss (4 pounds). Negative for chills, diaphoresis, fever and malaise/fatigue.       Feels "alright".  HENT: Positive for hearing loss (deaf). Negative for congestion, nosebleeds, sinus pain and sore throat.   Eyes: Negative.  Negative for blurred vision, double vision, photophobia, pain, discharge and redness.  Respiratory: Negative.  Negative for cough, hemoptysis, sputum production, shortness of breath and wheezing.   Cardiovascular: Negative.  Negative for chest pain, palpitations, orthopnea, leg swelling and PND.  Gastrointestinal: Negative.  Negative for abdominal pain, blood in stool, constipation, diarrhea, heartburn, melena (dark stool x 1), nausea and vomiting.       Feels bloated if eats too much.  Genitourinary: Negative.  Negative for dysuria, frequency, hematuria and urgency.  Musculoskeletal: Positive for joint pain (knees). Negative for back pain, falls, myalgias and neck pain.       Felt pop in right leg.  Skin:  Negative.  Negative for itching and rash.  Neurological: Positive for sensory change (grade I neuropathy in feet- stable). Negative for dizziness, tremors, focal weakness, weakness and headaches.  Endo/Heme/Allergies: Negative.  Does not bruise/bleed easily.  Psychiatric/Behavioral: Negative for depression (?) and memory loss. The patient is not nervous/anxious and does not have insomnia.   All other systems reviewed and are negative.  Performance status (ECOG): 1-2  Vital Signs BP 126/68 (BP Location: Right Arm, Patient Position: Sitting)   Pulse 88   Temp 97.7 F (36.5 C) (Oral)   Resp 20   Wt 159 lb 12.8 oz (72.5 kg)   SpO2 96%   BMI 31.21 kg/m  Physical Exam  Constitutional: She is oriented to person, place, and time and well-developed, well-nourished, and in no distress. No distress.  HENT:  Head: Normocephalic and atraumatic.  Mouth/Throat: Oropharynx is clear and moist. No oropharyngeal exudate.  Short curly gray hair.  Dentures.  Eyes: Pupils are equal, round, and reactive to light. Conjunctivae and EOM are normal. No scleral icterus.  Hazel eyes.  Neck: Normal range of motion. Neck supple. No JVD present.  Cardiovascular: Normal rate, regular rhythm and normal heart sounds. Exam reveals no gallop and no friction rub.  No murmur heard. Pulmonary/Chest: Effort normal and breath sounds normal. No respiratory distress. She has no wheezes. She has no rales.  Abdominal: Soft. Bowel sounds are normal. She exhibits no distension and no mass. There is no abdominal tenderness. There is no rebound and no guarding.  Musculoskeletal: Normal range of motion.        General: No tenderness or edema.  Lymphadenopathy:    She has no cervical adenopathy.    She has no axillary adenopathy.       Right: No supraclavicular adenopathy present.       Left: No supraclavicular adenopathy present.  Neurological: She is alert and oriented to person, place, and time.  Skin: Skin is warm and dry.  No rash noted. She is not diaphoretic. No erythema. No pallor.  Psychiatric: Mood, affect and judgment normal.  Nursing note and vitals reviewed.   Admissions: 02/20/2016 - 02/26/2016:  [facility] in [facility]:  She had renal failure and hypercalcemia. She was treated with IVF, calcitonin and Zometa.The patient has a history of coronary artery disease s/p CABG in 1993.  She has no history of heart failure.  01/20/2018 - 01/23/2018:  [facility].  She presented with nausea, vomiting, diarrhea and generalized weakness. Work-up showed hypokalemia, hypomagnesemia, dehydration and UTI.  She was treated with IV fluids and empiric IV antibiotics. Potassium and magnesium were repleted. Urine culture showed E. coli.  She was discharged on Augmentin. She was switched from HCTZ to chorthalidone. 02/01/2018 - 02/03/2018:  [facility] with hyponatremia.   Radiology studies: 02/20/2016:  Chest, abdomen and pelvic CT revealed a 3.7 cm subpleural mass within the left lower lobe, a 1.1 cm right lower lobe subpleural nodule , 9 mm RUL nodule, and 0.4 cm lingular nodule.  Between the left second, third, and fourth ribs there was 1.9 cm soft tissue mass. There was a 2.5 cm low-attenuation nodule in the left lobe of the thyroid.   There were prominent left axillary nodes (up to 1.0 cm).   There was no definite mediastinal adenopathy. There was a 5.5 x 4.6 cm cystic lesion in the left hepatic lobe.  There was a 3.1 cm nodule in the right adrenal gland. There was a 1.2 cm right retrocaval lymph node with additional prominent subcentimeter retroperitoneal nodes. There was 1.0 cm left common iliac node. There was a 6.2 x 5.2 cm soft tissue mass within the right ilium.  02/21/2016:  Head MRI revealed no evidence of malignancy.  02/21/2016:  Thyroid ultrasound revealed a 2.4 cm solid nodule in the inferior aspect of the left lobe 03/03/2016:  PET scan revealed a hypermetabolic left lower  lobe mass, bilateral pleural and pulmonary parenchymal nodular metastasis, a large pleural lesion invading the anterior left chest wall.   There was activity in the superior left ocular orbit.  There was a large lesion centered in the right iliac wing with soft tissue  extension.  There were hypermetabolic right external iliac lymph nodes 06/06/2016:  PET scan revealed significant interval response to therapy.  There was significant decrease in size and FDG uptake associated with left lower lobe lung lesion.  There was resolution of left-sided pleural base metastasis and left axillary and anterior mediastinal nodal metastasis.  There was resolution of previous hypermetabolic soft tissue metastasis/nodal metastasis posterior to the IVC.  There was marked improvement in hypermetabolic metastasis involving the right iliac wing.  There was persistent right upper lobe lung nodule which exhibits intense radiotracer uptake which may represent a focus of metastatic disease or synchronous primary lung neoplasm. 06/01/2017:  PET scan revealed no new or progressive findings.  The 9 mm RUL pulmonary nodule remained hypermetabolic (SUV 25.0, previously 7.3) and no change in size.  The index lesion in the posterior left lower lobe was minimally smaller on CT imaging with very low level FDG uptake (SUV 1.6).  There was no change in the appearance at site of previous disease at the right iliac wing (SUV 1.9 compared to 2.6 previously). 01/29/2017:  PET scan revealed interval progression of hypermetabolic nodules in the right upper (13 mm compared to 9 mm; SUV 10) and left lower lobes (26 mm compared to 22 mm; SUV 7.5).  There was new hypermetabolic focus of activity along the anterior left pleura although no underlying pleural or lung mass was evident. 09/18/2017:  PET scan revealed a mixed appearance, with marked improvement in the prior 2 pulmonary nodules; but with new Deauville 5 left common iliac lymph node enlargement; new  Deauville 5 right paraspinal lesion; and new Deauville 4 left infrahilar nodal activity.  There was a focus of accentuated metabolic activity which had been present along the right lower submandibular gland but currently seemed to project over the subcutaneous tissues without CT correlate. Current maximum SUV 10.5, previously 9.7.  There was increased accentuated activity along the left lateral spurring at L2-3. While typically such activity is inflammatory due to spurring, this had increased significantly since the prior exam and merit surveillance. There was a new 8 by 5 mm right upper lobe pulmonary nodule, Deauville 2, which also merits surveillance. 01/29/2018:  PET scan revealed persistent and progressive areas of hypermetabolism within the right submandibular gland, lingula of the left lung, left common iliac nodal chain, and right lumbar paraspinal soft tissues. Findings were compatible with Deauville criteria 4 and 5.  There was a new nodule is identified within the lingula which demonstrated intense uptake compatible with Deauville criteria 5.  05/14/2018:  Chest, abdomen, and pelvic CT revealed enlarging mediastinal adenopathy and right upper lobe nodule, indicative of disease progression. Necrotic lingular nodules were grossly stable in size with developing surrounding collapse/consolidation, possibly treatment related.  There was slight decrease in size of a left external iliac lymph node and right lumbar paraspinal soft tissue lesion.  There was a tiny left pleural effusion. 06/24/2018:  Head MRI revealed no evidence of intracranial metastasis.   Assessment:  Toni Griffith is a 78 y.o. female with stage IVB B-cell lymphoma, unclassifiable, with features intermediate between diffuse large B-cell lymphoma and classical Hodgkin's lymphoma ("gray zone" lymphoma).  She underwent right iliac wing bone/bone marrow biopsy on 03/08/2016.  PET scan on 03/03/2016 revealed a hypermetabolic left  lower lobe mass, bilateral pleural and pulmonary parenchymal nodular metastasis, a large pleural lesion invading the anterior left chest wall.   There was activity in the superior left ocular orbit.  There was a large lesion  centered in the right iliac wing with soft tissue extension.  There were hypermetabolic right external iliac lymph nodes  Echo on 04/04/2016 revealed an EF of 55-60%.  Hepatitis B and C testing was negative on 03/30/2016.   G6PD assay was normal.  She declined LP with MTX prophylaxis.  She received 6 cycles of mini-RCHOP (04/07/2016 - 07/21/2016).  She has had some transient fingertip numbness.  She received radiation to the right iliac wing,  from 10/11/2016 - 11/01/2016.  She received 3000 cGy over 3 weeks.  PET scan on 01/29/2017 revealed interval progression of hypermetabolic nodules in the right upper (13 mm compared to 9 mm; SUV 10) and left lower lobes (26 mm compared to 22 mm; SUV 7.5).  There was new hypermetabolic focus of activity along the anterior left pleura although no underlying pleural or lung mass was evident.  She has squamous cell lung cancer.  Foundation One on the lung biopsy on 03/15/2018 revealed MS-stable, tumor mutational burden 5 Muts/Mb, AKT2 amplification, CCNE1 amplification, CREBBP R1446L, KDR amplification, KIT amplification, PDGFRA amplification, PTEN loss exons 2-4, and GP49 splice site [id number]>R. Marland Kitchen  There were no alterations in EGFR, KRAS, RET, ALK, MET, ERBB2, BRAF, ROS1.  PDL-1 IHC analysis revealed a TPS score of 50%.  CT guided left lower lobe nodule biopsy on 02/19/2017 confirmed squamous cell carcinoma.  She underwent SBRT 03/26/2017 - 04/09/2017.  She received 5000 cGy in 5 fractions to the left lower lobe lesion.  She underwent SBRT to a RUL lesion from 05/23/2017 - 06/18/2017.  CT guided left upper lobe nodule biopsy on 02/20/2018 revealed squamous cell lung cancer.  She received SBRT from 04/09/2018 - 04/15/2018.   She received 6000 cGy  in 5 fractions.   Bronchoscopy on 05/30/2018 revealed no endobronchial lesions.  The 2 cm pre-carinal lymph node was biopsied.  Pathology revealed metastatic non-small cell carcinoma, favor squamous cell carcinoma.  PET scan on 09/18/2017 revealed a mixed appearance, with marked improvement in the prior 2 pulmonary nodules; but with new Deauville 5 left common iliac lymph node enlargement; new Deauville 5 right paraspinal lesion; and new Deauville 4 left infrahilar nodal activity.  There was a focus of accentuated metabolic activity which had been present along the right lower submandibular gland but currently seemed to project over the subcutaneous tissues without CT correlate. Current maximum SUV 10.5, previously 9.7. There was increased accentuated activity along the left lateral spurring at L2-3. While typically such activity is inflammatory due to spurring, this had increased significantly since the prior exam and merit surveillance. There was a new 8 by 5 mm right upper lobe pulmonary nodule, Deauville 2.  CT guided right paraspinal lesion biopsy on 10/16/2017 was compatible with involvement by the patient's previously diagnosed lymphoma.  The case was referred to [facility] hematopathology.  The neoplastic cells were largely CD30 negative.  Repeat CD30 immunohistochemistry faintly stained > 10% of the large atypical cells. CD20 stained the scattered large atypical cells and few small cells.   She received 3 cycles of brentuximab vedotin (11/23/2017 - 01/11/2018).  PET scan on 01/29/2018 revealed persistent and progressive areas of hypermetabolism within the right submandibular gland, lingula of the left lung, left common iliac nodal chain, and right lumbar paraspinal soft tissues. Findings were compatible with Deauville criteria 4 and 5.  There was a new nodule is identified within the lingula which demonstrated intense uptake compatible with Deauville criteria 5.   Chest, abdomen, and pelvic CT on  05/14/2018 revealed enlarging mediastinal adenopathy  and right upper lobe nodule, indicative of disease progression. Necrotic lingular nodules were grossly stable in size with developing surrounding collapse/consolidation, possibly treatment related.  There was slight decrease in size of a left external iliac lymph node and right lumbar paraspinal soft tissue lesion.  There was a tiny left pleural effusion.  Head MRI on 06/24/2018 revealed no evidence of intracranial metastasis.  She is s/p 3 cycles of pembrolizumab (06/14/2018  - 07/26/2018).  She has iron deficiency anemia.  Ferritin has been followed:  14 on 05/06/2018, 23 on 07/26/2018, and 36 on 08/16/2018.  She is on iron.  She denies any bleeding.  She has a grade I neuropathy.  She is on gabapentin.  B12 and folate were normal on 12/06/2017.  B12 was 3458 on 05/06/2018.  She is deaf and requires an interpreter.  Symptomatically, she denies any respiratory or B symptoms.  She felt a pop in her right femur/knee without trauma.  Exam is unremarkable.  Plan: 1.   Labs today:  CBC with diff, CMP, TSH, LDH, uric acid, ferritin. 2.   Pearline Cables zone lymphoma  Clinically, she is doing well Cycle #4 pembrolizumab today. Discuss plans for restaging prior to next cycle. Discuss symptom management.  She has antiemetics and pain medications at home to use on a prn bases.  Interventions are adequate.    3.   Squamous cell lung cancer  Clinically she is doing well without respiratory symptoms.  Cycle #4 pembrolizumab today.  Discuss restaging studies prior to next cycle.  4.   Chemotherapy-induced neuropathy Neuropathy is stable. Continue low dose gabapentin. 5.   Right leg pain  Etiology is unclear.  Discuss leg films today. 6.   Iron deficiency anemia Hemoglobin 11.3, hematocrit 33.9, MCV 87.6. Ferritin 36 today. Continue iron. Suspect dark stool secondary to oral iron.  Continue to monitor. 7.  Plain films right leg 8.   Schedule chest,  abdomen, and pelvic CT scans in 2 1/2 weeks. 9.   RTC in 3 weeks in [place] for MD assessment, labs (CBC with diff, CMP, LDh, uric acid, Mg, TSH, ferritin), review of CT scans, and cycle #5 pembrolizumab.   Addendum:  Plain films of the RIGHT knee and femur on 08/16/2018 revealed moderate degenerative joint disease. There was no acute abnormality seen in the RIGHT knee.  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 There was no acute abnormality seen in the right femur.   Lequita Asal, MD  08/16/18, 4:35 PM

## 2018-08-17 LAB — CEA: CEA: 2.6 ng/mL (ref 0.0–4.7)

## 2018-08-28 ENCOUNTER — Other Ambulatory Visit: Payer: Self-pay | Admitting: Urgent Care

## 2018-09-03 ENCOUNTER — Ambulatory Visit
Admission: RE | Admit: 2018-09-03 | Discharge: 2018-09-03 | Disposition: A | Discharge status: Home / Self Care | Payer: Medicare HMO | Source: Ambulatory Visit | Attending: Hematology and Oncology | Admitting: Hematology and Oncology

## 2018-09-03 DIAGNOSIS — C3490 Malignant neoplasm of unspecified part of unspecified bronchus or lung: Secondary | ICD-10-CM | POA: Diagnosis not present

## 2018-09-03 DIAGNOSIS — C8518 Unspecified B-cell lymphoma, lymph nodes of multiple sites: Secondary | ICD-10-CM | POA: Diagnosis present

## 2018-09-03 MED ORDER — IOPAMIDOL (ISOVUE-300) INJECTION 61%
100.0000 mL | Freq: Once | INTRAVENOUS | Status: AC | PRN
  Administered 2018-09-03: 100 mL via INTRAVENOUS

## 2018-09-06 ENCOUNTER — Inpatient Hospital Stay: Payer: Medicare HMO

## 2018-09-06 ENCOUNTER — Inpatient Hospital Stay: Payer: Medicare HMO | Attending: Hematology and Oncology

## 2018-09-06 ENCOUNTER — Inpatient Hospital Stay (HOSPITAL_BASED_OUTPATIENT_CLINIC_OR_DEPARTMENT_OTHER): Payer: Medicare HMO | Admitting: Hematology and Oncology

## 2018-09-06 ENCOUNTER — Encounter: Payer: Self-pay | Admitting: Hematology and Oncology

## 2018-09-06 VITALS — BP 105/68 | HR 97 | Temp 98.3°F | Resp 18 | Wt 159.0 lb

## 2018-09-06 DIAGNOSIS — D509 Iron deficiency anemia, unspecified: Secondary | ICD-10-CM | POA: Insufficient documentation

## 2018-09-06 DIAGNOSIS — Z7189 Other specified counseling: Secondary | ICD-10-CM

## 2018-09-06 DIAGNOSIS — Z5112 Encounter for antineoplastic immunotherapy: Secondary | ICD-10-CM | POA: Diagnosis not present

## 2018-09-06 DIAGNOSIS — C3490 Malignant neoplasm of unspecified part of unspecified bronchus or lung: Secondary | ICD-10-CM

## 2018-09-06 DIAGNOSIS — E538 Deficiency of other specified B group vitamins: Secondary | ICD-10-CM | POA: Insufficient documentation

## 2018-09-06 DIAGNOSIS — C8518 Unspecified B-cell lymphoma, lymph nodes of multiple sites: Secondary | ICD-10-CM | POA: Diagnosis not present

## 2018-09-06 DIAGNOSIS — C8590 Non-Hodgkin lymphoma, unspecified, unspecified site: Secondary | ICD-10-CM

## 2018-09-06 LAB — CBC WITH DIFFERENTIAL/PLATELET
Abs Immature Granulocytes: 0.03 10*3/uL (ref 0.00–0.07)
Basophils Absolute: 0.1 10*3/uL (ref 0.0–0.1)
Basophils Relative: 1 %
Eosinophils Absolute: 0.3 10*3/uL (ref 0.0–0.5)
Eosinophils Relative: 4 %
HCT: 35.4 % — ABNORMAL LOW (ref 36.0–46.0)
Hemoglobin: 11.7 g/dL — ABNORMAL LOW (ref 12.0–15.0)
Immature Granulocytes: 0 %
Lymphocytes Relative: 10 %
Lymphs Abs: 0.9 10*3/uL (ref 0.7–4.0)
MCH: 29.8 pg (ref 26.0–34.0)
MCHC: 33.1 g/dL (ref 30.0–36.0)
MCV: 90.3 fL (ref 80.0–100.0)
Monocytes Absolute: 1.3 10*3/uL — ABNORMAL HIGH (ref 0.1–1.0)
Monocytes Relative: 15 %
Neutro Abs: 5.6 10*3/uL (ref 1.7–7.7)
Neutrophils Relative %: 70 %
Platelets: 341 10*3/uL (ref 150–400)
RBC: 3.92 MIL/uL (ref 3.87–5.11)
RDW: 20 % — ABNORMAL HIGH (ref 11.5–15.5)
WBC: 8.2 10*3/uL (ref 4.0–10.5)
nRBC: 0 % (ref 0.0–0.2)

## 2018-09-06 LAB — COMPREHENSIVE METABOLIC PANEL
ALT: 18 U/L (ref 0–44)
AST: 26 U/L (ref 15–41)
Albumin: 2.9 g/dL — ABNORMAL LOW (ref 3.5–5.0)
Alkaline Phosphatase: 69 U/L (ref 38–126)
Anion gap: 4 — ABNORMAL LOW (ref 5–15)
BUN: 20 mg/dL (ref 8–23)
CO2: 25 mmol/L (ref 22–32)
Calcium: 9 mg/dL (ref 8.9–10.3)
Chloride: 107 mmol/L (ref 98–111)
Creatinine, Ser: 0.9 mg/dL (ref 0.44–1.00)
GFR calc Af Amer: >60 mL/min (ref >60)
GFR calc non Af Amer: >60 mL/min (ref >60)
Glucose, Bld: 121 mg/dL — ABNORMAL HIGH (ref 70–99)
Potassium: 3.6 mmol/L (ref 3.5–5.1)
Sodium: 136 mmol/L (ref 135–145)
Total Bilirubin: 0.6 mg/dL (ref 0.3–1.2)
Total Protein: 6.7 g/dL (ref 6.5–8.1)

## 2018-09-06 LAB — MAGNESIUM: Magnesium: 1.7 mg/dL (ref 1.7–2.4)

## 2018-09-06 LAB — URIC ACID: Uric Acid, Serum: 3.4 mg/dL (ref 2.5–7.1)

## 2018-09-06 LAB — LACTATE DEHYDROGENASE: LDH: 117 U/L (ref 98–192)

## 2018-09-06 LAB — TSH: TSH: 1.911 u[IU]/mL (ref 0.350–4.500)

## 2018-09-06 LAB — FERRITIN: Ferritin: 31 ng/mL (ref 11–307)

## 2018-09-06 MED ORDER — HEPARIN SOD (PORK) LOCK FLUSH 100 UNIT/ML IV SOLN
500.0000 [IU] | Freq: Once | INTRAVENOUS | Status: AC | PRN
  Administered 2018-09-06: 500 [IU]
  Filled 2018-09-06: qty 5

## 2018-09-06 MED ORDER — ONDANSETRON HCL 4 MG PO TABS
8.0000 mg | ORAL_TABLET | Freq: Once | ORAL | Status: AC
  Administered 2018-09-06: 8 mg via ORAL
  Filled 2018-09-06: qty 2

## 2018-09-06 MED ORDER — SODIUM CHLORIDE 0.9 % IV SOLN
Freq: Once | INTRAVENOUS | Status: AC
  Administered 2018-09-06: 10:00:00 via INTRAVENOUS
  Filled 2018-09-06: qty 250

## 2018-09-06 MED ORDER — SODIUM CHLORIDE 0.9% FLUSH
10.0000 mL | INTRAVENOUS | Status: DC | PRN
  Administered 2018-09-06: 10 mL
  Filled 2018-09-06: qty 10

## 2018-09-06 MED ORDER — SODIUM CHLORIDE 0.9 % IV SOLN
200.0000 mg | Freq: Once | INTRAVENOUS | Status: AC
  Administered 2018-09-06: 200 mg via INTRAVENOUS
  Filled 2018-09-06: qty 8

## 2018-09-06 NOTE — Progress Notes (Signed)
[facility] day:  09/06/18  Chief Complaint: Toni Griffith is a 78 y.o. 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  HPI:  The patient was last seen in the medical oncology clinic on 08/16/2018.  At that time, she felt "alright".  She denied any B symptoms.  She denied any respiratory symptoms. She noted some pain in her right knee to her hip.  She denied any falls.  She noted some dark stools 2 weeks prior.  She was taking oral iron.  She denied any hematochezia.  She received cycle #4 pembrolizumab.  Plain films of the RIGHT knee and femur on 08/16/2018 revealed moderate degenerative joint disease. There was no acute abnormality seen in the RIGHT knee.  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 There was no acute abnormality seen in the right femur.  Chest, abdomen, and pelvic CT on 09/03/2018 revealed interval decrease in size of previous index right paratracheal and left hilar lymph nodes. Left-sided pre-vascular lymph nodes have also decreased in size in the interval. There was a high left paratracheal lymph node however which has increased in size (0.6 cm to 1.1 cm).  There was progressive masslike architectural distortion within the perihilar left lung and lingula. There were surrounding areas of fibrosis and ground-glass attenuation.  These findings may reflect progressive post treatment changes. In the absence of interval treatment progressive neoplasm can not be excluded.  There was no residual or recurrent adenopathy within the abdomen or pelvis.  The rright paraspinal soft tissue nodule was not confidently visualized.  She has a hiatal hernia and aortic atherosclerosis.    During the interim, patient is doing well. She continues to have pain in her BILATERAL knees and hips that is worse at night.  She is taking APAP on a PRN basis, which has been effective. Patient denies that she has experienced any B symptoms. She denies any interval infections. She has not experienced any chest pain, shortness of breath, or palpitations. She denies nausea, vomiting, and changes to her bowel habits.   Patient noted plantar numbness to her LEFT foot. She advises that she "feels like there is something in her heel all of the time". Symptom persistent, yet stable, for several months.   Patient advises that she maintains an adequate appetite. She is eating well. Weight today is 159 lb (72.1 kg), which compared to her last visit to the clinic, represents a stable weight.   Patient complains of pain rated 7/10 in the clinic today.   Past Medical History:  Diagnosis Date  . Arthritis   . Cancer [facility])    lymphoma-right hip  . Deaf   . GERD (gastroesophageal reflux disease)   . Hypertension    NOT ON MED AT PRESENT    Past Surgical History:  Procedure Laterality Date  . CARDIAC SURGERY  1993  . CORONARY ARTERY BYPASS GRAFT     DOUBLE  . CYST REMOVAL HAND Right   . ENDOBRONCHIAL ULTRASOUND N/A 05/30/2018   Procedure: ENDOBRONCHIAL ULTRASOUND;  Surgeon: Tyler Pita, MD;  Location: [facility] ORS;  Service: Cardiopulmonary;  Laterality: N/A;  . PERIPHERAL VASCULAR CATHETERIZATION N/A 04/06/2016   Procedure: Glori Luis Cath Insertion;  Surgeon: Algernon Huxley, MD;  Location: [facility];  Service: Cardiovascular;  Laterality: N/A;    Family History  Problem Relation  Age of Onset  . Lung cancer Mother   . Lung cancer Father   . Diabetes Maternal Uncle   . Myasthenia gravis Maternal Grandmother   . Leukemia Maternal Grandfather     Social History:  reports that she quit smoking about 2 years ago. Her smoking use included cigarettes. She has a 80.00 pack-year smoking history. She has never used smokeless tobacco. She reports current alcohol use. She reports that she does not use drugs.  She smoked 1  1/2 packs per day for 20 years (30 pack year smoking history) until 10/2015.  She is hearing impaired (deaf).  She lives in [place] with her boyfriend.  Her daughter lives in [place].  Her daughter, [name]'s contact number is [phone].  She is living with her son.  The patient is accompanied her daughter, son, and an [place] interpreter.  Allergies: No Known Allergies  Current Medications: Current Outpatient Medications  Medication Sig Dispense Refill  . acetaminophen (TYLENOL) 500 MG tablet Take 500 mg by mouth every 6 (six) hours as needed for moderate pain.     Marland Kitchen allopurinol (ZYLOPRIM) 300 MG tablet TAKE 1 TABLET BY MOUTH EVERY DAY 90 tablet 0  . aspirin EC 81 MG tablet Take 81 mg by mouth daily.    Marland Kitchen atorvastatin (LIPITOR) 80 MG tablet Take 80 mg by mouth every evening.     Marland Kitchen b complex vitamins tablet Take 1 tablet by mouth daily.    . Cholecalciferol (VITAMIN D3) 2000 units capsule Take 2,000 Units by mouth daily.    . diclofenac sodium (VOLTAREN) 1 % GEL APPLY 2 G TOPICALLY 3 (THREE) TIMES DAILY AS NEEDED (LEFT KNEE). 100 g 1  . ferrous sulfate 325 (65 FE) MG tablet Take 650 mg by mouth daily with breakfast.    . FLUoxetine (PROZAC) 40 MG capsule Take 40 mg by mouth daily.     Marland Kitchen gabapentin (NEURONTIN) 100 MG capsule Take 2 capsules (200 mg total) by mouth at bedtime. 60 capsule 3  . HYDROcodone-acetaminophen (NORCO) 5-325 MG tablet Take 1 tablet by mouth every 6 (six) hours as needed for moderate pain. 30 tablet 0  . lidocaine-prilocaine (EMLA) cream Apply cream 1 hour before chemotherapy treatment and place small peive of saran wrap over cream to protect clothing 30 g 3  . Magnesium 250 MG TABS Take 1 tablet by mouth daily.    . ondansetron (ZOFRAN ODT) 4 MG disintegrating tablet Take 1 tablet (4 mg total) by mouth every 8 (eight) hours as needed for nausea or vomiting. 30 tablet 1  . pantoprazole (PROTONIX) 40 MG tablet Take 40 mg by mouth daily.     . vitamin B-12 (CYANOCOBALAMIN)  500 MCG tablet Take 500 mcg by mouth daily.    . vitamin E 400 UNIT capsule Take 400 Units by mouth daily.      No current facility-administered medications for this visit.    Facility-Administered Medications Ordered in Other Visits  Medication Dose Route Frequency Provider Last Rate Last Dose  . heparin lock flush 100 unit/mL  500 Units Intravenous Once Corcoran, Melissa C, MD      . sodium chloride flush (NS) 0.9 % injection 10 mL  10 mL Intravenous PRN Lequita Asal, MD   10 mL at 04/05/17 1034    Review of Systems  Constitutional: Negative.  Negative for chills, diaphoresis, fever, malaise/fatigue and weight loss (stable).       Feels "fine".  HENT: Positive for hearing loss (deaf). Negative  for congestion, ear discharge, ear pain, nosebleeds, sinus pain and sore throat.   Eyes: Negative.  Negative for blurred vision, double vision, photophobia, pain, discharge and redness.  Respiratory: Negative.  Negative for cough, hemoptysis, sputum production, shortness of breath and wheezing.   Cardiovascular: Negative.  Negative for chest pain, palpitations, orthopnea, leg swelling and PND.  Gastrointestinal: Negative.  Negative for abdominal pain, blood in stool, constipation, diarrhea, heartburn, melena, nausea and vomiting.  Genitourinary: Negative.  Negative for dysuria, frequency, hematuria and urgency.  Musculoskeletal: Positive for joint pain (knees). Negative for back pain, falls, myalgias and neck pain.  Skin: Negative.  Negative for itching and rash.  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 Negative for dizziness, tremors, focal weakness, weakness and headaches.  Endo/Heme/Allergies: Negative.  Does not bruise/bleed easily.  Psychiatric/Behavioral: Negative.  Negative for depression and memory loss. The patient is not nervous/anxious and does not have insomnia.   All other systems reviewed and are negative.  Performance status (ECOG):  1-2  Vital Signs BP 105/68 (BP Location: Left Arm, Patient Position: Sitting)   Pulse 97   Temp 98.3 F (36.8 C) (Tympanic)   Resp 18   Wt 159 lb (72.1 kg)   BMI 31.05 kg/m   Physical Exam  Constitutional: She is oriented to person, place, and time. No distress.  HENT:  Head: Normocephalic and atraumatic.  Mouth/Throat: Oropharynx is clear and moist. No oropharyngeal exudate.  Short curly gray hair.  Dentures.  Eyes: Pupils are equal, round, and reactive to light. Conjunctivae and EOM are normal. No scleral icterus.  Hazel eyes.  Neck: Normal range of motion. Neck supple. No JVD present.  Cardiovascular: Normal rate, regular rhythm and normal heart sounds. Exam reveals no gallop and no friction rub.  No murmur heard. Pulmonary/Chest: Effort normal and breath sounds normal. No respiratory distress. She has no wheezes. She has no rales.  Abdominal: Soft. Bowel sounds are normal. She exhibits no distension and no mass. There is no abdominal tenderness. There is no rebound and no guarding.  Musculoskeletal: Normal range of motion.        General: No tenderness or edema.  Lymphadenopathy:    She has no cervical adenopathy.    She has no axillary adenopathy.       Right: No supraclavicular adenopathy present.       Left: No supraclavicular adenopathy present.  Neurological: She is alert and oriented to person, place, and time.  Skin: Skin is warm and dry. No rash noted. She is not diaphoretic. No erythema. No pallor.  Psychiatric: Mood, affect and judgment normal.  Nursing note and vitals reviewed.   Results for orders placed or performed in visit on 09/06/18 (from the past 168 hour(s))  Magnesium   Collection Time: 09/06/18  9:02 AM  Result Value Ref Range   Magnesium 1.7 1.7 - 2.4 mg/dL  Lactate dehydrogenase   Collection Time: 09/06/18  9:02 AM  Result Value Ref Range   LDH 117 98 - 192 U/L  Comprehensive metabolic panel   Collection Time: 09/06/18  9:02 AM  Result Value  Ref Range   Sodium 136 135 - 145 mmol/L   Potassium 3.6 3.5 - 5.1 mmol/L   Chloride 107 98 - 111 mmol/L   CO2 25 22 - 32 mmol/L   Glucose, Bld 121 (H) 70 - 99 mg/dL   BUN 20 8 - 23 mg/dL   Creatinine, Ser 0.90 0.44 - 1.00 mg/dL   Calcium 9.0  8.9 - 10.3 mg/dL   Total Protein 6.7 6.5 - 8.1 g/dL   Albumin 2.9 (L) 3.5 - 5.0 g/dL   AST 26 15 - 41 U/L   ALT 18 0 - 44 U/L   Alkaline Phosphatase 69 38 - 126 U/L   Total Bilirubin 0.6 0.3 - 1.2 mg/dL   GFR calc non Af Amer >60 >60 mL/min   GFR calc Af Amer >60 >60 mL/min   Anion gap 4 (L) 5 - 15  CBC with Differential/Platelet   Collection Time: 09/06/18  9:02 AM  Result Value Ref Range   WBC 8.2 4.0 - 10.5 K/uL   RBC 3.92 3.87 - 5.11 MIL/uL   Hemoglobin 11.7 (L) 12.0 - 15.0 g/dL   HCT 35.4 (L) 36.0 - 46.0 %   MCV 90.3 80.0 - 100.0 fL   MCH 29.8 26.0 - 34.0 pg   MCHC 33.1 30.0 - 36.0 g/dL   RDW 20.0 (H) 11.5 - 15.5 %   Platelets 341 150 - 400 K/uL   nRBC 0.0 0.0 - 0.2 %   Neutrophils Relative % 70 %   Neutro Abs 5.6 1.7 - 7.7 K/uL   Lymphocytes Relative 10 %   Lymphs Abs 0.9 0.7 - 4.0 K/uL   Monocytes Relative 15 %   Monocytes Absolute 1.3 (H) 0.1 - 1.0 K/uL   Eosinophils Relative 4 %   Eosinophils Absolute 0.3 0.0 - 0.5 K/uL   Basophils Relative 1 %   Basophils Absolute 0.1 0.0 - 0.1 K/uL   Immature Granulocytes 0 %   Abs Immature Granulocytes 0.03 0.00 - 0.07 K/uL     Admissions: 02/20/2016 - 02/26/2016:  [facility]:  She had renal failure and hypercalcemia. She was treated with IVF, calcitonin and Zometa.The patient has a history of coronary artery disease s/p CABG in 1993.  She has no history of heart failure.  01/20/2018 - 01/23/2018:  [facility].  She presented with nausea, vomiting, diarrhea and generalized weakness. Work-up showed hypokalemia, hypomagnesemia, dehydration and UTI.  She was treated with IV fluids and empiric IV antibiotics. Potassium and magnesium  were repleted. Urine culture showed E. coli.  She was discharged on Augmentin. She was switched from HCTZ to chorthalidone. 02/01/2018 - 02/03/2018:  [facility] with hyponatremia.   Radiology studies: 02/20/2016:  Chest, abdomen and pelvic CT revealed a 3.7 cm subpleural mass within the left lower lobe, a 1.1 cm right lower lobe subpleural nodule , 9 mm RUL nodule, and 0.4 cm lingular nodule.  Between the left second, third, and fourth ribs there was 1.9 cm soft tissue mass. There was a 2.5 cm low-attenuation nodule in the left lobe of the thyroid.   There were prominent left axillary nodes (up to 1.0 cm).   There was no definite mediastinal adenopathy. There was a 5.5 x 4.6 cm cystic lesion in the left hepatic lobe.  There was a 3.1 cm nodule in the right adrenal gland. There was a 1.2 cm right retrocaval lymph node with additional prominent subcentimeter retroperitoneal nodes. There was 1.0 cm left common iliac node. There was a 6.2 x 5.2 cm soft tissue mass within the right ilium.  02/21/2016:  Head MRI revealed no evidence of malignancy.  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   There was activity in the superior left ocular orbit.  There was a large lesion centered in the right iliac wing with soft tissue extension.  There were hypermetabolic right external iliac lymph nodes 06/06/2016:  PET scan revealed significant interval response to therapy.  There was significant decrease in size and FDG uptake associated with left lower lobe lung lesion.  There was resolution of left-sided pleural base metastasis and left axillary and anterior mediastinal nodal metastasis.  There was resolution of previous hypermetabolic soft tissue metastasis/nodal metastasis  posterior to the IVC.  There was marked improvement in hypermetabolic metastasis involving the right iliac wing.  There was persistent right upper lobe lung nodule which exhibits intense radiotracer uptake which may represent a focus of metastatic disease or synchronous primary lung neoplasm. 06/01/2017:  PET scan revealed no new or progressive findings.  The 9 mm RUL pulmonary nodule remained hypermetabolic (SUV 91.4, previously 7.3) and no change in size.  The index lesion in the posterior left lower lobe was minimally smaller on CT imaging with very low level FDG uptake (SUV 1.6).  There was no change in the appearance at site of previous disease at the right iliac wing (SUV 1.9 compared to 2.6 previously). 01/29/2017:  PET scan revealed interval progression of hypermetabolic nodules in the right upper (13 mm compared to 9 mm; SUV 10) and left lower lobes (26 mm compared to 22 mm; SUV 7.5).  There was new hypermetabolic focus of activity along the anterior left pleura although no underlying pleural or lung mass was evident. 09/18/2017:  PET scan revealed a mixed appearance, with marked improvement in the prior 2 pulmonary nodules; but with new Deauville 5 left common iliac lymph node enlargement; new Deauville 5 right paraspinal lesion; and new Deauville 4 left infrahilar nodal activity.  There was a focus of accentuated metabolic activity which had been present along the right lower submandibular gland but currently seemed to project over the subcutaneous tissues without CT correlate. Current maximum SUV 10.5, previously 9.7.  There was increased accentuated activity along the left lateral spurring at L2-3. While typically such activity is inflammatory due to spurring, this had increased significantly since the prior exam and merit surveillance. There was a new 8 by 5 mm right upper lobe pulmonary nodule, Deauville 2, which also merits surveillance. 01/29/2018:  PET scan revealed persistent and progressive  areas of hypermetabolism within the right submandibular gland, lingula of the left lung, left common iliac nodal chain, and right lumbar paraspinal soft tissues. Findings were compatible with Deauville criteria 4 and 5.  There was a new nodule is identified within the lingula which demonstrated intense uptake compatible with Deauville criteria 5.  05/14/2018:  Chest, abdomen, and pelvic CT revealed enlarging mediastinal adenopathy and right upper lobe nodule, indicative of disease progression. Necrotic lingular nodules were grossly stable in size with developing surrounding collapse/consolidation, possibly treatment related.  There was slight decrease in size of a left external iliac lymph node and right lumbar paraspinal soft tissue lesion.  There was a tiny left pleural effusion. 06/24/2018:  Head MRI revealed no evidence of intracranial metastasis. 09/03/2018:  Chest, abdomen, and pelvic CT revealed interval decrease in size of previous index right paratracheal and left hilar lymph nodes. Left-sided pre-vascular lymph nodes have also decreased in size in the interval. There was a high left paratracheal lymph node however which has increased in size (0.6 cm to 1.1 cm).  There was  progressive masslike architectural distortion within the perihilar left lung and lingula. There were surrounding areas of fibrosis and ground-glass attenuation.  These findings may reflect progressive post treatment changes. In the absence of interval treatment progressive neoplasm can not be excluded.  There was no residual or recurrent adenopathy within the abdomen or pelvis.  The rright paraspinal soft tissue nodule was not confidently visualized.  She has a hiatal hernia and aortic atherosclerosis.     Assessment:  Toni Griffith is a 78 y.o. female with stage IVB B-cell lymphoma, unclassifiable, with features intermediate between diffuse large B-cell lymphoma and classical Hodgkin's lymphoma ("gray zone" lymphoma).  She  underwent right iliac wing bone/bone marrow biopsy on 03/08/2016.  PET scan on 03/03/2016 revealed a hypermetabolic left lower lobe mass, bilateral pleural and pulmonary parenchymal nodular metastasis, a large pleural lesion invading the anterior left chest wall.   There was activity in the superior left ocular orbit.  There was a large lesion centered in the right iliac wing with soft tissue extension.  There were hypermetabolic right external iliac lymph nodes  Echo on 04/04/2016 revealed an EF of 55-60%.  Hepatitis B and C testing was negative on 03/30/2016.   G6PD assay was normal.  She declined LP with MTX prophylaxis.  She received 6 cycles of mini-RCHOP (04/07/2016 - 07/21/2016).  She has had some transient fingertip numbness.  She received radiation to the right iliac wing,  from 10/11/2016 - 11/01/2016.  She received 3000 cGy over 3 weeks.  PET scan on 01/29/2017 revealed interval progression of hypermetabolic nodules in the right upper (13 mm compared to 9 mm; SUV 10) and left lower lobes (26 mm compared to 22 mm; SUV 7.5).  There was new hypermetabolic focus of activity along the anterior left pleura although no underlying pleural or lung mass was evident.  She has squamous cell lung cancer.  Foundation One on the lung biopsy on 03/15/2018 revealed MS-stable, tumor mutational burden 5 Muts/Mb, AKT2 amplification, CCNE1 amplification, CREBBP R1446L, KDR amplification, KIT amplification, PDGFRA amplification, PTEN loss exons 2-4, and WU98 splice site [id number]>N. Marland Kitchen  There were no alterations in EGFR, KRAS, RET, ALK, MET, ERBB2, BRAF, ROS1.  PDL-1 IHC analysis revealed a TPS score of 50%.  CT guided left lower lobe nodule biopsy on 02/19/2017 confirmed squamous cell carcinoma.  She underwent SBRT 03/26/2017 - 04/09/2017.  She received 5000 cGy in 5 fractions to the left lower lobe lesion.  She underwent SBRT to a RUL lesion from 05/23/2017 - 06/18/2017.  CT guided left upper lobe nodule biopsy  on 02/20/2018 revealed squamous cell lung cancer.  She received SBRT from 04/09/2018 - 04/15/2018.   She received 6000 cGy in 5 fractions.   Bronchoscopy on 05/30/2018 revealed no endobronchial lesions.  The 2 cm pre-carinal lymph node was biopsied.  Pathology revealed metastatic non-small cell carcinoma, favor squamous cell carcinoma.  PET scan on 09/18/2017 revealed a mixed appearance, with marked improvement in the prior 2 pulmonary nodules; but with new Deauville 5 left common iliac lymph node enlargement; new Deauville 5 right paraspinal lesion; and new Deauville 4 left infrahilar nodal activity.  There was a focus of accentuated metabolic activity which had been present along the right lower submandibular gland but currently seemed to project over the subcutaneous tissues without CT correlate. Current maximum SUV 10.5, previously 9.7. There was increased accentuated activity along the left lateral spurring at L2-3. While typically such activity is inflammatory due to spurring, this had increased significantly since the  prior exam and merit surveillance. There was a new 8 by 5 mm right upper lobe pulmonary nodule, Deauville 2.  CT guided right paraspinal lesion biopsy on 10/16/2017 was compatible with involvement by the patient's previously diagnosed lymphoma.  The case was referred to [facility] hematopathology.  The neoplastic cells were largely CD30 negative.  Repeat CD30 immunohistochemistry faintly stained > 10% of the large atypical cells. CD20 stained the scattered large atypical cells and few small cells.   She received 3 cycles of brentuximab vedotin (11/23/2017 - 01/11/2018).  PET scan on 01/29/2018 revealed persistent and progressive areas of hypermetabolism within the right submandibular gland, lingula of the left lung, left common iliac nodal chain, and right lumbar paraspinal soft tissues. Findings were compatible with Deauville criteria 4 and 5.  There was a new nodule is identified within the  lingula which demonstrated intense uptake compatible with Deauville criteria 5.   Chest, abdomen, and pelvic CT on 05/14/2018 revealed enlarging mediastinal adenopathy and right upper lobe nodule, indicative of disease progression. Necrotic lingular nodules were grossly stable in size with developing surrounding collapse/consolidation, possibly treatment related.  There was slight decrease in size of a left external iliac lymph node and right lumbar paraspinal soft tissue lesion.  There was a tiny left pleural effusion.  Head MRI on 06/24/2018 revealed no evidence of intracranial metastasis.  She is s/p 4 cycles of pembrolizumab (06/14/2018  - 08/16/2018).  Chest, abdomen, and pelvic CT on 09/03/2018 revealed interval decrease in size of previous index right paratracheal and left hilar lymph nodes. Left-sided pre-vascular lymph nodes have also decreased in size in the interval. There was a high left paratracheal lymph node however which has increased in size (0.6 cm to 1.1 cm).  There was progressive masslike architectural distortion within the perihilar left lung and lingula. There were surrounding areas of fibrosis and ground-glass attenuation.  These findings may reflect progressive post treatment changes. In the absence of interval treatment progressive neoplasm can not be excluded.  There was no residual or recurrent adenopathy within the abdomen or pelvis.  The right paraspinal soft tissue nodule was not confidently visualized.  She has a hiatal hernia and aortic atherosclerosis.    She has iron deficiency anemia.  Ferritin has been followed:  14 on 05/06/2018 and 23 on 07/26/2018.  She denies any bleeding.  She has a grade I neuropathy.  B12 and folate were normal on 12/06/2017.  She is deaf and requires an interpreter.  Symptomatically, she is doing well.  She denies any B symptoms.  She continues to have knee pain (right > left).  Exam is stable.  Plan: 1.   Labs today:  CBC with diff, CMP,  TSH, LDH, uric acid, ferritin. 2.   Pearline Cables zone lymphoma Review interval imaging studies.  Imaging personally reviewed. Interval decrease in adenopathy. Unclear significance of increased high left paratracheal lymph node (0.6 to 1.1 cm).  Continue to monitor. Clinically doing well. No B symptoms. Cycle #5 pembrolizumab today. Discuss symptom management.  She has antiemetics and pain medications at home to use on a prn bases.  Interventions are adequate.    3.   Squamous cell lung cancer  Imaging studies reviewed as above.  Clinically doing well.  No respiratory symptoms.  Cycle #5 pembrolizumab today.  Next imaging anticipated in 11/2018.  4.   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 Continue to monitor. 5.   Normocytic anemia Hemoglobin 11.7, hematocrit 35.4, MCV 90.3. Improving with each evaluation. Ferritin 31  today.  Continue iron rich foods. 6.   B12 deficiency Continue oral B12 500 mcg daily.  7.   RTC in 3 weeks for MD assessment, labs (CBC with diff, CMP, LDH, uric acid, Mg,TSH) and cycle #6 pembrolizumab.   Honor Loh, NP  09/06/18, 9:57 AM   I saw and evaluated the patient, participating in the key portions of the service and reviewing pertinent diagnostic studies and records.  I reviewed the nurse practitioner's note and agree with the findings and the plan.  The assessment and plan were discussed with the patient.  Several questions were asked by the patient and answered.   Nolon Stalls, MD 09/06/18, 9:57 AM

## 2018-09-06 NOTE — Progress Notes (Signed)
Pt here for follow up. Denies any concerns at this time.  

## 2018-09-24 ENCOUNTER — Other Ambulatory Visit: Payer: Self-pay

## 2018-09-24 DIAGNOSIS — C8518 Unspecified B-cell lymphoma, lymph nodes of multiple sites: Secondary | ICD-10-CM

## 2018-09-27 ENCOUNTER — Inpatient Hospital Stay: Payer: Medicare HMO | Attending: Hematology and Oncology

## 2018-09-27 ENCOUNTER — Inpatient Hospital Stay: Payer: Medicare HMO | Attending: Hematology and Oncology | Admitting: Hematology and Oncology

## 2018-09-27 ENCOUNTER — Encounter: Payer: Self-pay | Admitting: Hematology and Oncology

## 2018-09-27 VITALS — BP 103/71 | HR 102 | Temp 97.9°F | Resp 18 | Ht 60.0 in | Wt 160.1 lb

## 2018-09-27 VITALS — BP 111/69 | HR 84 | Temp 97.0°F | Resp 18

## 2018-09-27 DIAGNOSIS — I10 Essential (primary) hypertension: Secondary | ICD-10-CM | POA: Insufficient documentation

## 2018-09-27 DIAGNOSIS — T451X5A Adverse effect of antineoplastic and immunosuppressive drugs, initial encounter: Secondary | ICD-10-CM

## 2018-09-27 DIAGNOSIS — H9190 Unspecified hearing loss, unspecified ear: Secondary | ICD-10-CM

## 2018-09-27 DIAGNOSIS — C3490 Malignant neoplasm of unspecified part of unspecified bronchus or lung: Secondary | ICD-10-CM

## 2018-09-27 DIAGNOSIS — M25559 Pain in unspecified hip: Secondary | ICD-10-CM

## 2018-09-27 DIAGNOSIS — Z5112 Encounter for antineoplastic immunotherapy: Secondary | ICD-10-CM | POA: Diagnosis not present

## 2018-09-27 DIAGNOSIS — Z923 Personal history of irradiation: Secondary | ICD-10-CM

## 2018-09-27 DIAGNOSIS — Z87891 Personal history of nicotine dependence: Secondary | ICD-10-CM | POA: Insufficient documentation

## 2018-09-27 DIAGNOSIS — C8590 Non-Hodgkin lymphoma, unspecified, unspecified site: Secondary | ICD-10-CM

## 2018-09-27 DIAGNOSIS — Z79899 Other long term (current) drug therapy: Secondary | ICD-10-CM | POA: Insufficient documentation

## 2018-09-27 DIAGNOSIS — C8518 Unspecified B-cell lymphoma, lymph nodes of multiple sites: Secondary | ICD-10-CM

## 2018-09-27 DIAGNOSIS — Z7982 Long term (current) use of aspirin: Secondary | ICD-10-CM | POA: Insufficient documentation

## 2018-09-27 DIAGNOSIS — D509 Iron deficiency anemia, unspecified: Secondary | ICD-10-CM | POA: Diagnosis not present

## 2018-09-27 DIAGNOSIS — Z791 Long term (current) use of non-steroidal anti-inflammatories (NSAID): Secondary | ICD-10-CM | POA: Insufficient documentation

## 2018-09-27 DIAGNOSIS — K219 Gastro-esophageal reflux disease without esophagitis: Secondary | ICD-10-CM | POA: Insufficient documentation

## 2018-09-27 DIAGNOSIS — G620 Drug-induced polyneuropathy: Secondary | ICD-10-CM

## 2018-09-27 LAB — COMPREHENSIVE METABOLIC PANEL
ALT: 20 U/L (ref 0–44)
AST: 30 U/L (ref 15–41)
Albumin: 3.1 g/dL — ABNORMAL LOW (ref 3.5–5.0)
Alkaline Phosphatase: 67 U/L (ref 38–126)
Anion gap: 9 (ref 5–15)
BUN: 21 mg/dL (ref 8–23)
CO2: 22 mmol/L (ref 22–32)
Calcium: 9.1 mg/dL (ref 8.9–10.3)
Chloride: 104 mmol/L (ref 98–111)
Creatinine, Ser: 0.82 mg/dL (ref 0.44–1.00)
GFR calc Af Amer: >60 mL/min (ref >60)
GFR calc non Af Amer: >60 mL/min (ref >60)
Glucose, Bld: 142 mg/dL — ABNORMAL HIGH (ref 70–99)
Potassium: 3.6 mmol/L (ref 3.5–5.1)
Sodium: 135 mmol/L (ref 135–145)
Total Bilirubin: 0.8 mg/dL (ref 0.3–1.2)
Total Protein: 7 g/dL (ref 6.5–8.1)

## 2018-09-27 LAB — CBC WITH DIFFERENTIAL/PLATELET
Abs Immature Granulocytes: 0.03 10*3/uL (ref 0.00–0.07)
Basophils Absolute: 0.1 10*3/uL (ref 0.0–0.1)
Basophils Relative: 1 %
Eosinophils Absolute: 0.2 10*3/uL (ref 0.0–0.5)
Eosinophils Relative: 3 %
HCT: 37.5 % (ref 36.0–46.0)
Hemoglobin: 12.6 g/dL (ref 12.0–15.0)
Immature Granulocytes: 0 %
Lymphocytes Relative: 11 %
Lymphs Abs: 0.9 10*3/uL (ref 0.7–4.0)
MCH: 31.1 pg (ref 26.0–34.0)
MCHC: 33.6 g/dL (ref 30.0–36.0)
MCV: 92.6 fL (ref 80.0–100.0)
Monocytes Absolute: 0.9 10*3/uL (ref 0.1–1.0)
Monocytes Relative: 12 %
Neutro Abs: 5.6 10*3/uL (ref 1.7–7.7)
Neutrophils Relative %: 73 %
Platelets: 341 10*3/uL (ref 150–400)
RBC: 4.05 MIL/uL (ref 3.87–5.11)
RDW: 18.4 % — ABNORMAL HIGH (ref 11.5–15.5)
WBC: 7.7 10*3/uL (ref 4.0–10.5)
nRBC: 0 % (ref 0.0–0.2)

## 2018-09-27 LAB — LACTATE DEHYDROGENASE: LDH: 123 U/L (ref 98–192)

## 2018-09-27 LAB — URIC ACID: Uric Acid, Serum: 4.1 mg/dL (ref 2.5–7.1)

## 2018-09-27 LAB — MAGNESIUM: Magnesium: 1.7 mg/dL (ref 1.7–2.4)

## 2018-09-27 LAB — TSH: TSH: 1.942 u[IU]/mL (ref 0.350–4.500)

## 2018-09-27 MED ORDER — SODIUM CHLORIDE 0.9% FLUSH
10.0000 mL | INTRAVENOUS | Status: DC | PRN
  Administered 2018-09-27: 10 mL via INTRAVENOUS
  Filled 2018-09-27: qty 10

## 2018-09-27 MED ORDER — SODIUM CHLORIDE 0.9 % IV SOLN
Freq: Once | INTRAVENOUS | Status: AC
  Administered 2018-09-27: 10:00:00 via INTRAVENOUS
  Filled 2018-09-27: qty 250

## 2018-09-27 MED ORDER — HEPARIN SOD (PORK) LOCK FLUSH 100 UNIT/ML IV SOLN
500.0000 [IU] | Freq: Once | INTRAVENOUS | Status: AC
  Administered 2018-09-27: 500 [IU] via INTRAVENOUS

## 2018-09-27 MED ORDER — SODIUM CHLORIDE 0.9 % IV SOLN
200.0000 mg | Freq: Once | INTRAVENOUS | Status: AC
  Administered 2018-09-27: 200 mg via INTRAVENOUS
  Filled 2018-09-27: qty 8

## 2018-09-27 MED ORDER — ONDANSETRON HCL 4 MG PO TABS
8.0000 mg | ORAL_TABLET | Freq: Once | ORAL | Status: AC
  Administered 2018-09-27: 8 mg via ORAL

## 2018-09-27 NOTE — Progress Notes (Signed)
[facility] day:  09/27/18  Chief Complaint: Toni Griffith is a 78 y.o. 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  HPI:  The patient was last seen in the medical oncology clinic on 09/06/2018.  At that time, she was doing well.  She had knee and hip unrelated to her lymphoma and lung cancer.  Prior imaging had revealed degenerative joint disease.  She denied any shortness of breath.  She denied any B symptoms.  She received cycle #5 pembrolizumab.  During the interim, patient has been doing well overall. She has had a minor illness since being here last, which resolved without treatment. Patient continues to have knee and hip pain due to known osteoarthritis. Patient denies that she has experienced any B symptoms. She denies any nausea, vomiting, and changes to her bowel habits. She denies and bruising, bleeding, or areas of palpable adenopathy.   Patient advises that she maintains an adequate appetite. She is eating well. Weight today is 160 lb 0.9 oz (72.6 kg), which compared to her last visit to the clinic, represents a 1 pound increase. Son has patient set up for Meals on Wheels to help with more healthy meal options when family not available to assist with meal prep. She continues on daily oral iron BID.   Patient denies pain in the clinic today.   Past Medical History:  Diagnosis Date  . Arthritis   . Cancer [facility])    lymphoma-right hip  . Deaf   . GERD (gastroesophageal reflux disease)   . Hypertension    NOT ON MED AT PRESENT    Past Surgical History:  Procedure Laterality Date  . CARDIAC SURGERY  1993  . CORONARY ARTERY BYPASS GRAFT     DOUBLE  . CYST REMOVAL HAND Right   . ENDOBRONCHIAL ULTRASOUND N/A 05/30/2018   Procedure: ENDOBRONCHIAL ULTRASOUND;  Surgeon: Tyler Pita, MD;  Location: [facility] ORS;  Service: Cardiopulmonary;   Laterality: N/A;  . PERIPHERAL VASCULAR CATHETERIZATION N/A 04/06/2016   Procedure: Glori Luis Cath Insertion;  Surgeon: Algernon Huxley, MD;  Location: [facility];  Service: Cardiovascular;  Laterality: N/A;    Family History  Problem Relation Age of Onset  . Lung cancer Mother   . Lung cancer Father   . Diabetes Maternal Uncle   . Myasthenia gravis Maternal Grandmother   . Leukemia Maternal Grandfather     Social History:  reports that she quit smoking about 2 years ago. Her smoking use included cigarettes. She has a 80.00 pack-year smoking history. She has never used smokeless tobacco. She reports current alcohol use. She reports that she does not use drugs.  She smoked 1 1/2 packs per day for 20 years (30 pack year smoking history) until 10/2015.  She is hearing impaired (deaf).  She lives in [place] with her boyfriend.  Her daughter lives in [place].  Her daughter, [name]'s contact number is [phone].  She is living with her son.  The patient is accompanied her daughter, son,  and an [place] interpreter.  Allergies: No Known Allergies  Current Medications: Current Outpatient Medications  Medication Sig Dispense Refill  . acetaminophen (TYLENOL) 500 MG tablet Take 500 mg by mouth every 6 (six) hours as needed for moderate pain.     Marland Kitchen allopurinol (ZYLOPRIM) 300 MG tablet TAKE 1 TABLET BY MOUTH EVERY DAY 90 tablet 0  .  aspirin EC 81 MG tablet Take 81 mg by mouth daily.    Marland Kitchen atorvastatin (LIPITOR) 80 MG tablet Take 80 mg by mouth every evening.     Marland Kitchen b complex vitamins tablet Take 1 tablet by mouth daily.    . Cholecalciferol (VITAMIN D3) 2000 units capsule Take 2,000 Units by mouth daily.    . diclofenac sodium (VOLTAREN) 1 % GEL APPLY 2 G TOPICALLY 3 (THREE) TIMES DAILY AS NEEDED (LEFT KNEE). 100 g 1  . ferrous sulfate 325 (65 FE) MG tablet Take 650 mg by mouth daily with breakfast.    . FLUoxetine (PROZAC) 40 MG capsule Take 40 mg by mouth daily.     Marland Kitchen gabapentin (NEURONTIN) 100  MG capsule Take 2 capsules (200 mg total) by mouth at bedtime. 60 capsule 3  . HYDROcodone-acetaminophen (NORCO) 5-325 MG tablet Take 1 tablet by mouth every 6 (six) hours as needed for moderate pain. 30 tablet 0  . lidocaine-prilocaine (EMLA) cream Apply cream 1 hour before chemotherapy treatment and place small peive of saran wrap over cream to protect clothing 30 g 3  . Magnesium 250 MG TABS Take 1 tablet by mouth daily.    . ondansetron (ZOFRAN ODT) 4 MG disintegrating tablet Take 1 tablet (4 mg total) by mouth every 8 (eight) hours as needed for nausea or vomiting. 30 tablet 1  . pantoprazole (PROTONIX) 40 MG tablet Take 40 mg by mouth daily.     . vitamin B-12 (CYANOCOBALAMIN) 500 MCG tablet Take 500 mcg by mouth daily.    . vitamin E 400 UNIT capsule Take 400 Units by mouth daily.      No current facility-administered medications for this visit.    Facility-Administered Medications Ordered in Other Visits  Medication Dose Route Frequency Provider Last Rate Last Dose  . heparin lock flush 100 unit/mL  500 Units Intravenous Once Corcoran, Melissa C, MD      . heparin lock flush 100 unit/mL  500 Units Intravenous Once Corcoran, Melissa C, MD      . sodium chloride flush (NS) 0.9 % injection 10 mL  10 mL Intravenous PRN Nolon Stalls C, MD   10 mL at 04/05/17 1034  . sodium chloride flush (NS) 0.9 % injection 10 mL  10 mL Intravenous PRN Lequita Asal, MD   10 mL at 09/27/18 0915    Review of Systems  Constitutional: Negative for chills, fever, malaise/fatigue and weight loss (up 1 pound).       Feels "good".  HENT: Positive for hearing loss (deaf). Negative for congestion, ear discharge, nosebleeds, sinus pain and sore throat.   Eyes: Negative.  Negative for blurred vision, double vision, photophobia, pain, discharge and redness.  Respiratory: Negative.  Negative for cough, hemoptysis, sputum production, shortness of breath and wheezing.   Cardiovascular: Negative.  Negative  for chest pain, palpitations, orthopnea, leg swelling and PND.  Gastrointestinal: Negative.  Negative for abdominal pain, blood in stool, constipation, diarrhea, heartburn and melena.  Genitourinary: Negative.  Negative for dysuria, frequency, hematuria and urgency.  Musculoskeletal: Positive for joint pain (knees). Negative for back pain, falls, myalgias and neck pain.  Skin: Negative.  Negative for itching and rash.  Neurological: Positive for sensory change (grade I neuropathy in feet). Negative for dizziness, tremors, focal weakness, weakness and headaches.  Endo/Heme/Allergies: Negative.  Does not bruise/bleed easily.  Psychiatric/Behavioral: Negative.  Negative for depression, memory loss and suicidal ideas. The patient is not nervous/anxious and does not have insomnia.  All other systems reviewed and are negative.  Performance status (ECOG): 1-2  Vital Signs BP 103/71 (BP Location: Right Arm, Patient Position: Sitting)   Pulse (!) 102   Temp 97.9 F (36.6 C)   Resp 18   Ht 5' (1.524 m)   Wt 160 lb 0.9 oz (72.6 kg)   SpO2 97%   BMI 31.26 kg/m   Physical Exam  Constitutional: She is oriented to person, place, and time and well-developed, well-nourished, and in no distress. No distress.  Sitting comfortably in a wheelchair.  HENT:  Head: Normocephalic and atraumatic.  Mouth/Throat: Oropharynx is clear and moist. No oropharyngeal exudate.  Short gray hair.  Dentures.  Eyes: Pupils are equal, round, and reactive to light. Conjunctivae and EOM are normal. No scleral icterus.  Hazel eyes.  Neck: Normal range of motion. Neck supple. No JVD present.  Cardiovascular: Normal rate, regular rhythm and normal heart sounds. Exam reveals no gallop and no friction rub.  No murmur heard. Pulmonary/Chest: Breath sounds normal. No respiratory distress. She has no wheezes. She has no rales.  Abdominal: Soft. Bowel sounds are normal. She exhibits no distension and no mass. There is no  abdominal tenderness.  Musculoskeletal: Normal range of motion.        General: No tenderness or edema.  Lymphadenopathy:    She has no cervical adenopathy.    She has no axillary adenopathy.       Right: No supraclavicular adenopathy present.       Left: No supraclavicular adenopathy present.  Neurological: She is alert and oriented to person, place, and time.  Skin: Skin is warm and dry. No rash noted. She is not diaphoretic. No erythema. No pallor.  Psychiatric: Mood, affect and judgment normal.  Nursing note and vitals reviewed.   Results for orders placed or performed in visit on 09/27/18 (from the past 168 hour(s))  CBC with Differential/Platelet   Collection Time: 09/27/18  9:20 AM  Result Value Ref Range   WBC 7.7 4.0 - 10.5 K/uL   RBC 4.05 3.87 - 5.11 MIL/uL   Hemoglobin 12.6 12.0 - 15.0 g/dL   HCT 37.5 36.0 - 46.0 %   MCV 92.6 80.0 - 100.0 fL   MCH 31.1 26.0 - 34.0 pg   MCHC 33.6 30.0 - 36.0 g/dL   RDW 18.4 (H) 11.5 - 15.5 %   Platelets 341 150 - 400 K/uL   nRBC 0.0 0.0 - 0.2 %   Neutrophils Relative % 73 %   Neutro Abs 5.6 1.7 - 7.7 K/uL   Lymphocytes Relative 11 %   Lymphs Abs 0.9 0.7 - 4.0 K/uL   Monocytes Relative 12 %   Monocytes Absolute 0.9 0.1 - 1.0 K/uL   Eosinophils Relative 3 %   Eosinophils Absolute 0.2 0.0 - 0.5 K/uL   Basophils Relative 1 %   Basophils Absolute 0.1 0.0 - 0.1 K/uL   Immature Granulocytes 0 %   Abs Immature Granulocytes 0.03 0.00 - 0.07 K/uL     Admissions: 02/20/2016 - 02/26/2016:  [facility]:  She had renal failure and hypercalcemia. She was treated with IVF, calcitonin and Zometa.The patient has a history of coronary artery disease s/p CABG in 1993.  She has no history of heart failure.  01/20/2018 - 01/23/2018:  [facility].  She presented with nausea, vomiting, diarrhea and generalized weakness. Work-up showed hypokalemia, hypomagnesemia, dehydration and UTI.  She was  treated with IV fluids  and empiric IV antibiotics. Potassium and magnesium were repleted. Urine culture showed E. coli.  She was discharged on Augmentin. She was switched from HCTZ to chorthalidone. 02/01/2018 - 02/03/2018:  [facility] with hyponatremia.   Radiology studies: 02/20/2016:  Chest, abdomen and pelvic CT revealed a 3.7 cm subpleural mass within the left lower lobe, a 1.1 cm right lower lobe subpleural nodule , 9 mm RUL nodule, and 0.4 cm lingular nodule.  Between the left second, third, and fourth ribs there was 1.9 cm soft tissue mass. There was a 2.5 cm low-attenuation nodule in the left lobe of the thyroid.   There were prominent left axillary nodes (up to 1.0 cm).   There was no definite mediastinal adenopathy. There was a 5.5 x 4.6 cm cystic lesion in the left hepatic lobe.  There was a 3.1 cm nodule in the right adrenal gland. There was a 1.2 cm right retrocaval lymph node with additional prominent subcentimeter retroperitoneal nodes. There was 1.0 cm left common iliac node. There was a 6.2 x 5.2 cm soft tissue mass within the right ilium.  02/21/2016:  Head MRI revealed no evidence of malignancy.  02/21/2016:  Thyroid ultrasound revealed a 2.4 cm solid nodule in the inferior aspect of the left lobe 03/03/2016:  PET scan revealed a hypermetabolic left lower lobe mass, bilateral pleural and pulmonary parenchymal nodular metastasis, a large pleural lesion invading the anterior left chest wall.   There was activity in the superior left ocular orbit.  There was a large lesion centered in the right iliac wing with soft tissue extension.  There were hypermetabolic right external iliac lymph nodes 06/06/2016:  PET scan revealed significant interval response to therapy.  There was significant decrease in size and FDG uptake associated with left lower lobe lung lesion.  There was resolution of left-sided pleural base metastasis and left axillary and anterior mediastinal nodal metastasis.  There was  resolution of previous hypermetabolic soft tissue metastasis/nodal metastasis posterior to the IVC.  There was marked improvement in hypermetabolic metastasis involving the right iliac wing.  There was persistent right upper lobe lung nodule which exhibits intense radiotracer uptake which may represent a focus of metastatic disease or synchronous primary lung neoplasm. 06/01/2017:  PET scan revealed no new or progressive findings.  The 9 mm RUL pulmonary nodule remained hypermetabolic (SUV 48.5, previously 7.3) and no change in size.  The index lesion in the posterior left lower lobe was minimally smaller on CT imaging with very low level FDG uptake (SUV 1.6).  There was no change in the appearance at site of previous disease at the right iliac wing (SUV 1.9 compared to 2.6 previously). 01/29/2017:  PET scan revealed interval progression of hypermetabolic nodules in the right upper (13 mm compared to 9 mm; SUV 10) and left lower lobes (26 mm compared to 22 mm; SUV 7.5).  There was new hypermetabolic focus of activity along the anterior left pleura although no underlying pleural or lung mass was evident. 09/18/2017:  PET scan revealed a mixed appearance, with marked improvement in the prior 2 pulmonary nodules; but with new Deauville 5 left common iliac lymph node enlargement; new Deauville 5 right paraspinal lesion; and new Deauville 4 left infrahilar nodal activity.  There was a focus of accentuated metabolic activity which had been present along the right lower submandibular gland but currently seemed to project over the subcutaneous tissues without CT correlate. Current maximum SUV 10.5, previously 9.7.  There was increased accentuated activity along the left  lateral spurring at L2-3. While typically such activity is inflammatory due to spurring, this had increased significantly since the prior exam and merit surveillance. There was a new 8 by 5 mm right upper lobe pulmonary nodule, Deauville 2, which also  merits surveillance. 01/29/2018:  PET scan revealed persistent and progressive areas of hypermetabolism within the right submandibular gland, lingula of the left lung, left common iliac nodal chain, and right lumbar paraspinal soft tissues. Findings were compatible with Deauville criteria 4 and 5.  There was a new nodule is identified within the lingula which demonstrated intense uptake compatible with Deauville criteria 5.  05/14/2018:  Chest, abdomen, and pelvic CT revealed enlarging mediastinal adenopathy and right upper lobe nodule, indicative of disease progression. Necrotic lingular nodules were grossly stable in size with developing surrounding collapse/consolidation, possibly treatment related.  There was slight decrease in size of a left external iliac lymph node and right lumbar paraspinal soft tissue lesion.  There was a tiny left pleural effusion. 06/24/2018:  Head MRI revealed no evidence of intracranial metastasis. 09/03/2018:  Chest, abdomen, and pelvic CT revealed interval decrease in size of previous index right paratracheal and left hilar lymph nodes. Left-sided pre-vascular lymph nodes have also decreased in size in the interval. There was a high left paratracheal lymph node however which has increased in size (0.6 cm to 1.1 cm).  There was progressive masslike architectural distortion within the perihilar left lung and lingula. There were surrounding areas of fibrosis and ground-glass attenuation.  These findings may reflect progressive post treatment changes. In the absence of interval treatment progressive neoplasm can not be excluded.  There was no residual or recurrent adenopathy within the abdomen or pelvis.  The rright paraspinal soft tissue nodule was not confidently visualized.  She has a hiatal hernia and aortic atherosclerosis.     Assessment:  Nelle Sayed is a 78 y.o. female with stage IVB B-cell lymphoma, unclassifiable, with features intermediate between diffuse large  B-cell lymphoma and classical Hodgkin's lymphoma ("gray zone" lymphoma).  She underwent right iliac wing bone/bone marrow biopsy on 03/08/2016.  PET scan on 03/03/2016 revealed a hypermetabolic left lower lobe mass, bilateral pleural and pulmonary parenchymal nodular metastasis, a large pleural lesion invading the anterior left chest wall.   There was activity in the superior left ocular orbit.  There was a large lesion centered in the right iliac wing with soft tissue extension.  There were hypermetabolic right external iliac lymph nodes  Echo on 04/04/2016 revealed an EF of 55-60%.  Hepatitis B and C testing was negative on 03/30/2016.   G6PD assay was normal.  She declined LP with MTX prophylaxis.  She received 6 cycles of mini-RCHOP (04/07/2016 - 07/21/2016).  She has had some transient fingertip numbness.  She received radiation to the right iliac wing,  from 10/11/2016 - 11/01/2016.  She received 3000 cGy over 3 weeks.  PET scan on 01/29/2017 revealed interval progression of hypermetabolic nodules in the right upper (13 mm compared to 9 mm; SUV 10) and left lower lobes (26 mm compared to 22 mm; SUV 7.5).  There was new hypermetabolic focus of activity along the anterior left pleura although no underlying pleural or lung mass was evident.  She has squamous cell lung cancer.  Foundation One on the lung biopsy on 03/15/2018 revealed MS-stable, tumor mutational burden 5 Muts/Mb, AKT2 amplification, CCNE1 amplification, CREBBP R1446L, KDR amplification, KIT amplification, PDGFRA amplification, PTEN loss exons 2-4, and WR60 splice site [id number]. Marland Kitchen  There were no  alterations in EGFR, KRAS, RET, ALK, MET, ERBB2, BRAF, ROS1.  PDL-1 IHC analysis revealed a TPS score of 50%.  CT guided left lower lobe nodule biopsy on 02/19/2017 confirmed squamous cell carcinoma.  She underwent SBRT 03/26/2017 - 04/09/2017.  She received 5000 cGy in 5 fractions to the left lower lobe lesion.  She underwent SBRT to a RUL  lesion from 05/23/2017 - 06/18/2017.  CT guided left upper lobe nodule biopsy on 02/20/2018 revealed squamous cell lung cancer.  She received SBRT from 04/09/2018 - 04/15/2018.   She received 6000 cGy in 5 fractions.   Bronchoscopy on 05/30/2018 revealed no endobronchial lesions.  The 2 cm pre-carinal lymph node was biopsied.  Pathology revealed metastatic non-small cell carcinoma, favor squamous cell carcinoma.  PET scan on 09/18/2017 revealed a mixed appearance, with marked improvement in the prior 2 pulmonary nodules; but with new Deauville 5 left common iliac lymph node enlargement; new Deauville 5 right paraspinal lesion; and new Deauville 4 left infrahilar nodal activity.  There was a focus of accentuated metabolic activity which had been present along the right lower submandibular gland but currently seemed to project over the subcutaneous tissues without CT correlate. Current maximum SUV 10.5, previously 9.7. There was increased accentuated activity along the left lateral spurring at L2-3. While typically such activity is inflammatory due to spurring, this had increased significantly since the prior exam and merit surveillance. There was a new 8 by 5 mm right upper lobe pulmonary nodule, Deauville 2.  CT guided right paraspinal lesion biopsy on 10/16/2017 was compatible with involvement by the patient's previously diagnosed lymphoma.  The case was referred to [facility] hematopathology.  The neoplastic cells were largely CD30 negative.  Repeat CD30 immunohistochemistry faintly stained > 10% of the large atypical cells. CD20 stained the scattered large atypical cells and few small cells.   She received 3 cycles of brentuximab vedotin (11/23/2017 - 01/11/2018).  PET scan on 01/29/2018 revealed persistent and progressive areas of hypermetabolism within the right submandibular gland, lingula of the left lung, left common iliac nodal chain, and right lumbar paraspinal soft tissues. Findings were compatible  with Deauville criteria 4 and 5.  There was a new nodule is identified within the lingula which demonstrated intense uptake compatible with Deauville criteria 5.   Chest, abdomen, and pelvic CT on 05/14/2018 revealed enlarging mediastinal adenopathy and right upper lobe nodule, indicative of disease progression. Necrotic lingular nodules were grossly stable in size with developing surrounding collapse/consolidation, possibly treatment related.  There was slight decrease in size of a left external iliac lymph node and right lumbar paraspinal soft tissue lesion.  There was a tiny left pleural effusion.  Head MRI on 06/24/2018 revealed no evidence of intracranial metastasis.  She is s/p 5 cycles of pembrolizumab (06/14/2018  - 09/06/2018).  Chest, abdomen, and pelvic CT on 09/03/2018 revealed interval decrease in size of previous index right paratracheal and left hilar lymph nodes. Left-sided pre-vascular lymph nodes have also decreased in size in the interval. There was a high left paratracheal lymph node however which has increased in size (0.6 cm to 1.1 cm).  There was progressive masslike architectural distortion within the perihilar left lung and lingula. There were surrounding areas of fibrosis and ground-glass attenuation.  These findings may reflect progressive post treatment changes. In the absence of interval treatment progressive neoplasm can not be excluded.  There was no residual or recurrent adenopathy within the abdomen or pelvis.  The rright paraspinal soft tissue nodule was not confidently  visualized.  She has a hiatal hernia and aortic atherosclerosis.    She has iron deficiency anemia.  Ferritin has been followed:  14 on 05/06/2018 and 23 on 07/26/2018.  She denies any bleeding.  She has a grade I neuropathy.  B12 and folate were normal on 12/06/2017.  She is deaf and requires an interpreter.  Symptomatically, she feels good.  She denies any complaint.  Exam is stable.  Plan: 1.    Labs today:  CBC with diff, CMP, LDH, uric acid, Mg, TSH. 2.   Pearline Cables zone lymphoma Patient is tolerating treatment well. Imaging on 09/03/2018 revealed decreasing adenopathy except for high left paratracheal lymph node. Cycle #6 pembrolizumab today. Discuss symptom management.  She has antiemetics and pain medications at home to use on a prn bases.  Interventions are adequate.    3.   Squamous cell lung cancer  Patient tolerating treatment well.  Imaging on 09/03/2018 revealed no new lung lesions and progressive radiation changes.  Cycle #6 pembrolizumab today.  Continue treatment every 3 weeks.  4.   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 Continue low-dose gabapentin.  5.   Normocytic anemia, resolved Hemoglobin 12.6, hematocrit 37.5, MCV 92.6. Last ferritin was 31 on 09/06/2018. Goal ferritin 100. 6.   B12 deficiency Continue supplemental B12. Check B12 and folate in 10/2018. 7.   RTC in 3 weeks for MD assessment, labs (CBC with diff, CMP, TSH, LDH, uric acid, ferritin), and cycle #7 pembrolizumab.    Honor Loh, NP  09/27/18, 9:43 AM   I saw and evaluated the patient, participating in the key portions of the service and reviewing pertinent diagnostic studies and records.  I reviewed the nurse practitioner's note and agree with the findings and the plan.  The assessment and plan were discussed with the patient.  Multiple questions were asked by the patient and her family and answered.   Nolon Stalls, MD 09/27/18, 9:43 AM

## 2018-09-27 NOTE — Progress Notes (Signed)
Patient c/o pain to bilateral legs ( pain level 8) but the patient states it has been an ongoing thing

## 2018-10-15 ENCOUNTER — Encounter: Payer: Self-pay | Admitting: Urgent Care

## 2018-10-16 ENCOUNTER — Telehealth: Payer: Self-pay | Admitting: Urgent Care

## 2018-10-16 NOTE — Telephone Encounter (Signed)
  [facility] [address], [place], [place] [address] Phone: [phone] Fax: [phone]   Date: 10/16/18  Name: Afreen Siebels DOB: [birth year] MRN: [id number]  Re:  Appointment cancellation  Received communication from patient's daughter as follows:    Spoke with attending oncologist Mike Gip, MD) who advised that it would be acceptable for patient to postpone treatment this week. I have communicated back with [name] to let her know. Additionally, I have asked scheduling to communicate with patient's daughter in order to reschedule her appointments in 1 week.  Honor Loh, MSN, APRN, FNP-C, CEN Oncology/Hematology Nurse Practitioner  [place] 10/16/18, 6:56 AM

## 2018-10-18 ENCOUNTER — Ambulatory Visit: Payer: Medicare HMO | Admitting: Urgent Care

## 2018-10-18 ENCOUNTER — Ambulatory Visit: Payer: Medicare HMO

## 2018-10-18 ENCOUNTER — Other Ambulatory Visit: Payer: Medicare HMO

## 2018-10-25 ENCOUNTER — Ambulatory Visit: Payer: Medicare HMO

## 2018-10-25 ENCOUNTER — Ambulatory Visit: Payer: Medicare HMO | Admitting: Hematology and Oncology

## 2018-10-25 ENCOUNTER — Other Ambulatory Visit: Payer: Medicare HMO

## 2018-10-29 ENCOUNTER — Telehealth: Payer: Self-pay

## 2018-10-29 NOTE — Telephone Encounter (Signed)
Spoke with ms [name] to inform her, per Dr Mike Gip she would like for the patient to come in and continue with treatment . Ms [name] was agreeable and understanding to get her mother her for treatment.

## 2018-11-01 ENCOUNTER — Inpatient Hospital Stay: Payer: Medicare HMO | Attending: Hematology and Oncology

## 2018-11-01 ENCOUNTER — Inpatient Hospital Stay (HOSPITAL_BASED_OUTPATIENT_CLINIC_OR_DEPARTMENT_OTHER): Payer: Medicare HMO | Admitting: Hematology and Oncology

## 2018-11-01 ENCOUNTER — Encounter: Payer: Self-pay | Admitting: Hematology and Oncology

## 2018-11-01 ENCOUNTER — Other Ambulatory Visit: Payer: Self-pay

## 2018-11-01 ENCOUNTER — Inpatient Hospital Stay: Payer: Medicare HMO

## 2018-11-01 VITALS — BP 123/80 | HR 101 | Temp 97.8°F | Resp 18 | Ht 60.0 in | Wt 162.7 lb

## 2018-11-01 DIAGNOSIS — M25561 Pain in right knee: Secondary | ICD-10-CM | POA: Insufficient documentation

## 2018-11-01 DIAGNOSIS — M25562 Pain in left knee: Secondary | ICD-10-CM | POA: Diagnosis not present

## 2018-11-01 DIAGNOSIS — E538 Deficiency of other specified B group vitamins: Secondary | ICD-10-CM | POA: Diagnosis not present

## 2018-11-01 DIAGNOSIS — G620 Drug-induced polyneuropathy: Secondary | ICD-10-CM | POA: Diagnosis not present

## 2018-11-01 DIAGNOSIS — H9190 Unspecified hearing loss, unspecified ear: Secondary | ICD-10-CM | POA: Insufficient documentation

## 2018-11-01 DIAGNOSIS — Z5112 Encounter for antineoplastic immunotherapy: Secondary | ICD-10-CM | POA: Insufficient documentation

## 2018-11-01 DIAGNOSIS — E871 Hypo-osmolality and hyponatremia: Secondary | ICD-10-CM | POA: Diagnosis not present

## 2018-11-01 DIAGNOSIS — M25469 Effusion, unspecified knee: Secondary | ICD-10-CM | POA: Insufficient documentation

## 2018-11-01 DIAGNOSIS — C3490 Malignant neoplasm of unspecified part of unspecified bronchus or lung: Secondary | ICD-10-CM

## 2018-11-01 DIAGNOSIS — Z79899 Other long term (current) drug therapy: Secondary | ICD-10-CM | POA: Insufficient documentation

## 2018-11-01 DIAGNOSIS — E860 Dehydration: Secondary | ICD-10-CM | POA: Diagnosis not present

## 2018-11-01 DIAGNOSIS — Z791 Long term (current) use of non-steroidal anti-inflammatories (NSAID): Secondary | ICD-10-CM

## 2018-11-01 DIAGNOSIS — C8518 Unspecified B-cell lymphoma, lymph nodes of multiple sites: Secondary | ICD-10-CM

## 2018-11-01 DIAGNOSIS — C786 Secondary malignant neoplasm of retroperitoneum and peritoneum: Secondary | ICD-10-CM

## 2018-11-01 DIAGNOSIS — C3432 Malignant neoplasm of lower lobe, left bronchus or lung: Secondary | ICD-10-CM

## 2018-11-01 DIAGNOSIS — Z87891 Personal history of nicotine dependence: Secondary | ICD-10-CM

## 2018-11-01 DIAGNOSIS — C8330 Diffuse large B-cell lymphoma, unspecified site: Secondary | ICD-10-CM | POA: Insufficient documentation

## 2018-11-01 DIAGNOSIS — R350 Frequency of micturition: Secondary | ICD-10-CM | POA: Diagnosis not present

## 2018-11-01 DIAGNOSIS — C801 Malignant (primary) neoplasm, unspecified: Secondary | ICD-10-CM

## 2018-11-01 DIAGNOSIS — Z7982 Long term (current) use of aspirin: Secondary | ICD-10-CM | POA: Diagnosis not present

## 2018-11-01 DIAGNOSIS — D509 Iron deficiency anemia, unspecified: Secondary | ICD-10-CM | POA: Diagnosis not present

## 2018-11-01 DIAGNOSIS — C8590 Non-Hodgkin lymphoma, unspecified, unspecified site: Secondary | ICD-10-CM

## 2018-11-01 DIAGNOSIS — G63 Polyneuropathy in diseases classified elsewhere: Secondary | ICD-10-CM

## 2018-11-01 LAB — URINALYSIS, COMPLETE (UACMP) WITH MICROSCOPIC
Bilirubin Urine: NEGATIVE
Glucose, UA: NEGATIVE mg/dL
Hgb urine dipstick: NEGATIVE
Ketones, ur: NEGATIVE mg/dL
Nitrite: POSITIVE — AB
Protein, ur: NEGATIVE mg/dL
RBC / HPF: NONE SEEN RBC/hpf (ref 0–5)
Specific Gravity, Urine: 1.015 (ref 1.005–1.030)
pH: 5 (ref 5.0–8.0)

## 2018-11-01 LAB — CBC WITH DIFFERENTIAL/PLATELET
Abs Immature Granulocytes: 0.04 10*3/uL (ref 0.00–0.07)
Basophils Absolute: 0.1 10*3/uL (ref 0.0–0.1)
Basophils Relative: 1 %
Eosinophils Absolute: 0.2 10*3/uL (ref 0.0–0.5)
Eosinophils Relative: 2 %
HCT: 38.2 % (ref 36.0–46.0)
Hemoglobin: 12.9 g/dL (ref 12.0–15.0)
Immature Granulocytes: 1 %
Lymphocytes Relative: 13 %
Lymphs Abs: 1.1 10*3/uL (ref 0.7–4.0)
MCH: 32.1 pg (ref 26.0–34.0)
MCHC: 33.8 g/dL (ref 30.0–36.0)
MCV: 95 fL (ref 80.0–100.0)
Monocytes Absolute: 0.9 10*3/uL (ref 0.1–1.0)
Monocytes Relative: 11 %
Neutro Abs: 5.9 10*3/uL (ref 1.7–7.7)
Neutrophils Relative %: 72 %
Platelets: 279 10*3/uL (ref 150–400)
RBC: 4.02 MIL/uL (ref 3.87–5.11)
RDW: 15.9 % — ABNORMAL HIGH (ref 11.5–15.5)
WBC: 8.1 10*3/uL (ref 4.0–10.5)
nRBC: 0 % (ref 0.0–0.2)

## 2018-11-01 LAB — COMPREHENSIVE METABOLIC PANEL
ALT: 20 U/L (ref 0–44)
AST: 26 U/L (ref 15–41)
Albumin: 3.3 g/dL — ABNORMAL LOW (ref 3.5–5.0)
Alkaline Phosphatase: 73 U/L (ref 38–126)
Anion gap: 8 (ref 5–15)
BUN: 22 mg/dL (ref 8–23)
CO2: 22 mmol/L (ref 22–32)
Calcium: 9.1 mg/dL (ref 8.9–10.3)
Chloride: 101 mmol/L (ref 98–111)
Creatinine, Ser: 0.96 mg/dL (ref 0.44–1.00)
GFR calc Af Amer: >60 mL/min (ref >60)
GFR calc non Af Amer: 58 mL/min — ABNORMAL LOW (ref >60)
Glucose, Bld: 156 mg/dL — ABNORMAL HIGH (ref 70–99)
Potassium: 3.5 mmol/L (ref 3.5–5.1)
Sodium: 131 mmol/L — ABNORMAL LOW (ref 135–145)
Total Bilirubin: 1.2 mg/dL (ref 0.3–1.2)
Total Protein: 7.1 g/dL (ref 6.5–8.1)

## 2018-11-01 LAB — FERRITIN: Ferritin: 59 ng/mL (ref 11–307)

## 2018-11-01 LAB — URIC ACID: Uric Acid, Serum: 3.6 mg/dL (ref 2.5–7.1)

## 2018-11-01 LAB — TSH: TSH: 1.523 u[IU]/mL (ref 0.350–4.500)

## 2018-11-01 LAB — LACTATE DEHYDROGENASE: LDH: 116 U/L (ref 98–192)

## 2018-11-01 MED ORDER — HEPARIN SOD (PORK) LOCK FLUSH 100 UNIT/ML IV SOLN: 500.0000 [IU] | Freq: Once | INTRAVENOUS | Status: DC

## 2018-11-01 MED ORDER — ONDANSETRON HCL 4 MG PO TABS
8.0000 mg | ORAL_TABLET | Freq: Once | ORAL | Status: AC
  Administered 2018-11-01: 11:00:00 8 mg via ORAL
  Filled 2018-11-01: qty 2

## 2018-11-01 MED ORDER — HEPARIN SOD (PORK) LOCK FLUSH 100 UNIT/ML IV SOLN
500.0000 [IU] | Freq: Once | INTRAVENOUS | Status: AC | PRN
  Administered 2018-11-01: 500 [IU]
  Filled 2018-11-01: qty 5

## 2018-11-01 MED ORDER — SODIUM CHLORIDE 0.9 % IV SOLN
200.0000 mg | Freq: Once | INTRAVENOUS | Status: AC
  Administered 2018-11-01: 11:00:00 200 mg via INTRAVENOUS
  Filled 2018-11-01: qty 8

## 2018-11-01 MED ORDER — SODIUM CHLORIDE 0.9% FLUSH
10.0000 mL | INTRAVENOUS | Status: DC | PRN
  Administered 2018-11-01: 10:00:00 10 mL via INTRAVENOUS
  Filled 2018-11-01: qty 10

## 2018-11-01 MED ORDER — SODIUM CHLORIDE 0.9 % IV SOLN
Freq: Once | INTRAVENOUS | Status: AC
  Administered 2018-11-01: 10:00:00 via INTRAVENOUS
  Filled 2018-11-01: qty 250

## 2018-11-01 NOTE — Progress Notes (Signed)
Patient c/o increase pain noted to bilateral knees ( pain level 8) noted today

## 2018-11-01 NOTE — Progress Notes (Signed)
[facility]     [address]     [place], [place] [address]     Phone: [phone]      Fax: [phone]       Clinic day:  11/01/18  Chief Complaint: Toni Griffith is a 78 y.o. 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  HPI:  The patient was last seen in the medical oncology clinic on 09/27/2018.  At that time, she was doing well.  She denied any B symptoms.  She denied any respiratory symptoms.  She received cycle #6 pembrolizumab.  During the interim, he has felt "pretty good".  She denies any new problems.  She denies any B symptoms.  Weight is up 2 pounds.  She denies any infections.  She denies any adenopathy, bruising or bleeding.  She denies any respiratory symptoms.  Her main complaint is bilateral knee pain for which she feels her knees are "stiff and crusted".  Her daughter states that she has a little bit of urinary frequency at night.  Patient is unconcerned, but her daughter wishes her to have a UA and culture to ensure she does not have an infection.   Past Medical History:  Diagnosis Date   Arthritis    Cancer ([place])    lymphoma-right hip   Deaf    GERD (gastroesophageal reflux disease)    Hypertension    NOT ON MED AT PRESENT    Past Surgical History:  Procedure Laterality Date   CARDIAC SURGERY  1993   CORONARY ARTERY BYPASS GRAFT     DOUBLE   CYST REMOVAL HAND Right    ENDOBRONCHIAL ULTRASOUND N/A 05/30/2018   Procedure: ENDOBRONCHIAL ULTRASOUND;  Surgeon: Tyler Pita, MD;  Location: [facility] ORS;  Service: Cardiopulmonary;  Laterality: N/A;   PERIPHERAL VASCULAR CATHETERIZATION N/A 04/06/2016   Procedure: Glori Luis Cath Insertion;  Surgeon: Algernon Huxley, MD;  Location: [facility];  Service: Cardiovascular;  Laterality: N/A;    Family History  Problem Relation Age of Onset   Lung cancer Mother    Lung  cancer Father    Diabetes Maternal Uncle    Myasthenia gravis Maternal Grandmother    Leukemia Maternal Grandfather     Social History:  reports that she quit smoking about 2 years ago. Her smoking use included cigarettes. She has a 80.00 pack-year smoking history. She has never used smokeless tobacco. She reports current alcohol use. She reports that she does not use drugs.  She smoked 1 1/2 packs per day for 20 years (30 pack year smoking history) until 10/2015.  She is hearing impaired (deaf).  She lives in [place] with her boyfriend.  Her daughter lives in [place].  Her daughter, Toni Griffith's contact number is [phone].  She is living with her son.  The patient is accompanied by an [place] interpreter.  Her daughter is outside on the phone secondary to the COVID-19 pandemic.  Allergies: No Known Allergies  Current Medications: Current Outpatient Medications  Medication Sig Dispense Refill   allopurinol (ZYLOPRIM) 300 MG tablet TAKE 1 TABLET BY MOUTH EVERY DAY 90 tablet 0   aspirin EC 81 MG tablet Take 81 mg by mouth daily.     atorvastatin (LIPITOR) 80 MG tablet Take 80 mg by mouth every evening.      b complex vitamins tablet Take 1 tablet by mouth daily.  Cholecalciferol (VITAMIN D3) 2000 units capsule Take 2,000 Units by mouth daily.     diclofenac sodium (VOLTAREN) 1 % GEL APPLY 2 G TOPICALLY 3 (THREE) TIMES DAILY AS NEEDED (LEFT KNEE). 100 g 1   ferrous sulfate 325 (65 FE) MG tablet Take 650 mg by mouth daily with breakfast.     FLUoxetine (PROZAC) 40 MG capsule Take 40 mg by mouth daily.      gabapentin (NEURONTIN) 100 MG capsule Take 2 capsules (200 mg total) by mouth at bedtime. 60 capsule 3   HYDROcodone-acetaminophen (NORCO) 5-325 MG tablet Take 1 tablet by mouth every 6 (six) hours as needed for moderate pain. 30 tablet 0   Magnesium 250 MG TABS Take 1 tablet by mouth daily.     pantoprazole (PROTONIX) 40 MG tablet Take 40 mg by mouth daily.      vitamin B-12  (CYANOCOBALAMIN) 500 MCG tablet Take 500 mcg by mouth daily.     vitamin E 400 UNIT capsule Take 400 Units by mouth daily.      acetaminophen (TYLENOL) 500 MG tablet Take 500 mg by mouth every 6 (six) hours as needed for moderate pain.      lidocaine-prilocaine (EMLA) cream Apply cream 1 hour before chemotherapy treatment and place small peive of saran wrap over cream to protect clothing (Patient not taking: Reported on 11/01/2018) 30 g 3   ondansetron (ZOFRAN ODT) 4 MG disintegrating tablet Take 1 tablet (4 mg total) by mouth every 8 (eight) hours as needed for nausea or vomiting. (Patient not taking: Reported on 11/01/2018) 30 tablet 1   No current facility-administered medications for this visit.    Facility-Administered Medications Ordered in Other Visits  Medication Dose Route Frequency Provider Last Rate Last Dose   heparin lock flush 100 unit/mL  500 Units Intravenous Once Yordi Krager C, MD       heparin lock flush 100 unit/mL  500 Units Intravenous Once Lear Carstens C, MD       sodium chloride flush (NS) 0.9 % injection 10 mL  10 mL Intravenous PRN Kaleiyah Polsky C, MD   10 mL at 04/05/17 1034   sodium chloride flush (NS) 0.9 % injection 10 mL  10 mL Intravenous PRN Lequita Asal, MD        Review of Systems  Constitutional: Negative.  Negative for chills, diaphoresis, fever, malaise/fatigue and weight loss (up 2 pounds).       Feels "pretty good".  HENT: Positive for hearing loss (deaf). Negative for congestion, nosebleeds, sinus pain and sore throat.   Eyes: Negative.  Negative for blurred vision, double vision, photophobia, pain, discharge and redness.  Respiratory: Negative.  Negative for cough, hemoptysis, sputum production, shortness of breath and wheezing.   Cardiovascular: Negative.  Negative for chest pain, palpitations, orthopnea, leg swelling and PND.  Gastrointestinal: Negative.  Negative for abdominal pain, blood in stool, constipation, diarrhea,  heartburn, melena, nausea and vomiting.       Eating well.  Genitourinary: Positive for frequency (at night). Negative for dysuria, hematuria and urgency.  Musculoskeletal: Positive for joint pain (bilateral knee). Negative for back pain, falls, myalgias and neck pain.  Skin: Negative.  Negative for itching and rash.  Neurological: Positive for sensory change (left heel numb). Negative for dizziness, tingling, tremors, focal weakness, weakness and headaches.  Endo/Heme/Allergies: Negative.  Does not bruise/bleed easily.  Psychiatric/Behavioral: Negative.  Negative for depression, memory loss and suicidal ideas. The patient is not nervous/anxious and does not have insomnia.  All other systems reviewed and are negative.  Performance status (ECOG): 1  Vital Signs BP 123/80 (Patient Position: Sitting) Comment (BP Location): right forearm   Pulse (!) 101    Temp 97.8 F (36.6 C) (Tympanic)    Resp 18    Ht 5' (1.524 m)    Wt 162 lb 11.2 oz (73.8 kg)    BMI 31.78 kg/m   Physical Exam  Constitutional: She is oriented to person, place, and time.  Well appearing woman sitting comfortably in a wheelchair in no acute distress.  HENT:  Head: Normocephalic and atraumatic.  Mouth/Throat: Oropharynx is clear and moist. No oropharyngeal exudate.  Short gray hair.  Dentures (loose).  Eyes: Pupils are equal, round, and reactive to light. Conjunctivae and EOM are normal. No scleral icterus.  Hazel eyes.  Neck: Normal range of motion. Neck supple. No JVD present.  Cardiovascular: Normal rate, regular rhythm and normal heart sounds. Exam reveals no gallop and no friction rub.  No murmur heard. Pulmonary/Chest: Effort normal and breath sounds normal. No respiratory distress. She has no wheezes. She has no rales.  Abdominal: Soft. Bowel sounds are normal. She exhibits no distension and no mass. There is no abdominal tenderness. There is no rebound and no guarding.  Musculoskeletal: Normal range of motion.         General: No tenderness or edema.  Lymphadenopathy:    She has no cervical adenopathy.    She has no axillary adenopathy.       Right: No supraclavicular adenopathy present.       Left: No supraclavicular adenopathy present.  Neurological: She is alert and oriented to person, place, and time.  Skin: Skin is warm and dry. No rash noted. She is not diaphoretic. No erythema. No pallor.  Psychiatric: Mood, affect and judgment normal.  Nursing note and vitals reviewed.   Results for orders placed or performed in visit on 11/01/18 (from the past 168 hour(s))  CBC with Differential/Platelet   Collection Time: 11/01/18  9:35 AM  Result Value Ref Range   WBC 8.1 4.0 - 10.5 K/uL   RBC 4.02 3.87 - 5.11 MIL/uL   Hemoglobin 12.9 12.0 - 15.0 g/dL   HCT 38.2 36.0 - 46.0 %   MCV 95.0 80.0 - 100.0 fL   MCH 32.1 26.0 - 34.0 pg   MCHC 33.8 30.0 - 36.0 g/dL   RDW 15.9 (H) 11.5 - 15.5 %   Platelets 279 150 - 400 K/uL   nRBC 0.0 0.0 - 0.2 %   Neutrophils Relative % 72 %   Neutro Abs 5.9 1.7 - 7.7 K/uL   Lymphocytes Relative 13 %   Lymphs Abs 1.1 0.7 - 4.0 K/uL   Monocytes Relative 11 %   Monocytes Absolute 0.9 0.1 - 1.0 K/uL   Eosinophils Relative 2 %   Eosinophils Absolute 0.2 0.0 - 0.5 K/uL   Basophils Relative 1 %   Basophils Absolute 0.1 0.0 - 0.1 K/uL   Immature Granulocytes 1 %   Abs Immature Granulocytes 0.04 0.00 - 0.07 K/uL     Admissions: 02/20/2016 - 02/26/2016:  [facility]:  She had renal failure and hypercalcemia. She was treated with IVF, calcitonin and Zometa.The patient has a history of coronary artery disease s/p CABG in 1993.  She has no history of heart failure.  01/20/2018 - 01/23/2018:  [facility].  She presented with nausea, vomiting, diarrhea and generalized weakness. Work-up showed hypokalemia,  hypomagnesemia, dehydration and UTI.  She was treated with IV fluids and empiric IV antibiotics. Potassium and  magnesium were repleted. Urine culture showed E. coli.  She was discharged on Augmentin. She was switched from HCTZ to chorthalidone. 02/01/2018 - 02/03/2018:  [facility] with hyponatremia.   Radiology studies: 02/20/2016:  Chest, abdomen and pelvic CT revealed a 3.7 cm subpleural mass within the left lower lobe, a 1.1 cm right lower lobe subpleural nodule , 9 mm RUL nodule, and 0.4 cm lingular nodule.  Between the left second, third, and fourth ribs there was 1.9 cm soft tissue mass. There was a 2.5 cm low-attenuation nodule in the left lobe of the thyroid.   There were prominent left axillary nodes (up to 1.0 cm).   There was no definite mediastinal adenopathy. There was a 5.5 x 4.6 cm cystic lesion in the left hepatic lobe.  There was a 3.1 cm nodule in the right adrenal gland. There was a 1.2 cm right retrocaval lymph node with additional prominent subcentimeter retroperitoneal nodes. There was 1.0 cm left common iliac node. There was a 6.2 x 5.2 cm soft tissue mass within the right ilium.  02/21/2016:  Head MRI revealed no evidence of malignancy.  02/21/2016:  Thyroid ultrasound revealed a 2.4 cm solid nodule in the inferior aspect of the left lobe 03/03/2016:  PET scan revealed a hypermetabolic left lower lobe mass, bilateral pleural and pulmonary parenchymal nodular metastasis, a large pleural lesion invading the anterior left chest wall.   There was activity in the superior left ocular orbit.  There was a large lesion centered in the right iliac wing with soft tissue extension.  There were hypermetabolic right external iliac lymph nodes 06/06/2016:  PET scan revealed significant interval response to therapy.  There was significant decrease in size and FDG uptake associated with left lower lobe lung lesion.  There was resolution of left-sided pleural base metastasis and left axillary and anterior mediastinal nodal metastasis.  There was resolution of previous hypermetabolic soft tissue metastasis/nodal  metastasis posterior to the IVC.  There was marked improvement in hypermetabolic metastasis involving the right iliac wing.  There was persistent right upper lobe lung nodule which exhibits intense radiotracer uptake which may represent a focus of metastatic disease or synchronous primary lung neoplasm. 06/01/2017:  PET scan revealed no new or progressive findings.  The 9 mm RUL pulmonary nodule remained hypermetabolic (SUV 54.0, previously 7.3) and no change in size.  The index lesion in the posterior left lower lobe was minimally smaller on CT imaging with very low level FDG uptake (SUV 1.6).  There was no change in the appearance at site of previous disease at the right iliac wing (SUV 1.9 compared to 2.6 previously). 01/29/2017:  PET scan revealed interval progression of hypermetabolic nodules in the right upper (13 mm compared to 9 mm; SUV 10) and left lower lobes (26 mm compared to 22 mm; SUV 7.5).  There was new hypermetabolic focus of activity along the anterior left pleura although no underlying pleural or lung mass was evident. 09/18/2017:  PET scan revealed a mixed appearance, with marked improvement in the prior 2 pulmonary nodules; but with new Deauville 5 left common iliac lymph node enlargement; new Deauville 5 right paraspinal lesion; and new Deauville 4 left infrahilar nodal activity.  There was a focus of accentuated metabolic activity which had been present along the right lower submandibular gland but currently seemed to project over the subcutaneous tissues without CT correlate. Current maximum SUV  10.5, previously 9.7.  There was increased accentuated activity along the left lateral spurring at L2-3. While typically such activity is inflammatory due to spurring, this had increased significantly since the prior exam and merit surveillance. There was a new 8 by 5 mm right upper lobe pulmonary nodule, Deauville 2, which also merits surveillance. 01/29/2018:  PET scan revealed persistent and  progressive areas of hypermetabolism within the right submandibular gland, lingula of the left lung, left common iliac nodal chain, and right lumbar paraspinal soft tissues. Findings were compatible with Deauville criteria 4 and 5.  There was a new nodule is identified within the lingula which demonstrated intense uptake compatible with Deauville criteria 5.  05/14/2018:  Chest, abdomen, and pelvic CT revealed enlarging mediastinal adenopathy and right upper lobe nodule, indicative of disease progression. Necrotic lingular nodules were grossly stable in size with developing surrounding collapse/consolidation, possibly treatment related.  There was slight decrease in size of a left external iliac lymph node and right lumbar paraspinal soft tissue lesion.  There was a tiny left pleural effusion. 06/24/2018:  Head MRI revealed no evidence of intracranial metastasis. 09/03/2018:  Chest, abdomen, and pelvic CT revealed interval decrease in size of previous index right paratracheal and left hilar lymph nodes. Left-sided pre-vascular lymph nodes have also decreased in size in the interval. There was a high left paratracheal lymph node however which has increased in size (0.6 cm to 1.1 cm).  There was progressive masslike architectural distortion within the perihilar left lung and lingula. There were surrounding areas of fibrosis and ground-glass attenuation.  These findings may reflect progressive post treatment changes. In the absence of interval treatment progressive neoplasm can not be excluded.  There was no residual or recurrent adenopathy within the abdomen or pelvis.  The rright paraspinal soft tissue nodule was not confidently visualized.  She has a hiatal hernia and aortic atherosclerosis.     Assessment:  Toni Griffith is a 78 y.o. female with stage IVB B-cell lymphoma, unclassifiable, with features intermediate between diffuse large B-cell lymphoma and classical Hodgkin's lymphoma ("gray zone"  lymphoma).  She underwent right iliac wing bone/bone marrow biopsy on 03/08/2016.  PET scan on 03/03/2016 revealed a hypermetabolic left lower lobe mass, bilateral pleural and pulmonary parenchymal nodular metastasis, a large pleural lesion invading the anterior left chest wall.   There was activity in the superior left ocular orbit.  There was a large lesion centered in the right iliac wing with soft tissue extension.  There were hypermetabolic right external iliac lymph nodes  Echo on 04/04/2016 revealed an EF of 55-60%.  Hepatitis B and C testing was negative on 03/30/2016.   G6PD assay was normal.  She declined LP with MTX prophylaxis.  She received 6 cycles of mini-RCHOP (04/07/2016 - 07/21/2016).  She has had some transient fingertip numbness.  She received radiation to the right iliac wing,  from 10/11/2016 - 11/01/2016.  She received 3000 cGy over 3 weeks.  PET scan on 01/29/2017 revealed interval progression of hypermetabolic nodules in the right upper (13 mm compared to 9 mm; SUV 10) and left lower lobes (26 mm compared to 22 mm; SUV 7.5).  There was new hypermetabolic focus of activity along the anterior left pleura although no underlying pleural or lung mass was evident.  She has squamous cell lung cancer.  Foundation One on the lung biopsy on 03/15/2018 revealed MS-stable, tumor mutational burden 5 Muts/Mb, AKT2 amplification, CCNE1 amplification, CREBBP R1446L, KDR amplification, KIT amplification, PDGFRA amplification, PTEN loss  exons 2-4, and DJ24 splice site 268+3M>H. Marland Kitchen  There were no alterations in EGFR, KRAS, RET, ALK, MET, ERBB2, BRAF, ROS1.  PDL-1 IHC analysis revealed a TPS score of 50%.  CT guided left lower lobe nodule biopsy on 02/19/2017 confirmed squamous cell carcinoma.  She underwent SBRT 03/26/2017 - 04/09/2017.  She received 5000 cGy in 5 fractions to the left lower lobe lesion.  She underwent SBRT to a RUL lesion from 05/23/2017 - 06/18/2017.  CT guided left upper lobe  nodule biopsy on 02/20/2018 revealed squamous cell lung cancer.  She received SBRT from 04/09/2018 - 04/15/2018.   She received 6000 cGy in 5 fractions.   Bronchoscopy on 05/30/2018 revealed no endobronchial lesions.  The 2 cm pre-carinal lymph node was biopsied.  Pathology revealed metastatic non-small cell carcinoma, favor squamous cell carcinoma.  PET scan on 09/18/2017 revealed a mixed appearance, with marked improvement in the prior 2 pulmonary nodules; but with new Deauville 5 left common iliac lymph node enlargement; new Deauville 5 right paraspinal lesion; and new Deauville 4 left infrahilar nodal activity.  There was a focus of accentuated metabolic activity which had been present along the right lower submandibular gland but currently seemed to project over the subcutaneous tissues without CT correlate. Current maximum SUV 10.5, previously 9.7. There was increased accentuated activity along the left lateral spurring at L2-3. While typically such activity is inflammatory due to spurring, this had increased significantly since the prior exam and merit surveillance. There was a new 8 by 5 mm right upper lobe pulmonary nodule, Deauville 2.  CT guided right paraspinal lesion biopsy on 10/16/2017 was compatible with involvement by the patient's previously diagnosed lymphoma.  The case was referred to [facility] hematopathology.  The neoplastic cells were largely CD30 negative.  Repeat CD30 immunohistochemistry faintly stained > 10% of the large atypical cells. CD20 stained the scattered large atypical cells and few small cells.   She received 3 cycles of brentuximab vedotin (11/23/2017 - 01/11/2018).  PET scan on 01/29/2018 revealed persistent and progressive areas of hypermetabolism within the right submandibular gland, lingula of the left lung, left common iliac nodal chain, and right lumbar paraspinal soft tissues. Findings were compatible with Deauville criteria 4 and 5.  There was a new nodule is  identified within the lingula which demonstrated intense uptake compatible with Deauville criteria 5.   Chest, abdomen, and pelvic CT on 05/14/2018 revealed enlarging mediastinal adenopathy and right upper lobe nodule, indicative of disease progression. Necrotic lingular nodules were grossly stable in size with developing surrounding collapse/consolidation, possibly treatment related.  There was slight decrease in size of a left external iliac lymph node and right lumbar paraspinal soft tissue lesion.  There was a tiny left pleural effusion.  Head MRI on 06/24/2018 revealed no evidence of intracranial metastasis.  She is s/p 6 cycles of pembrolizumab (06/14/2018  - 09/27/2018).  Chest, abdomen, and pelvic CT on 09/03/2018 revealed interval decrease in size of previous index right paratracheal and left hilar lymph nodes. Left-sided pre-vascular lymph nodes have also decreased in size in the interval. There was a high left paratracheal lymph node however which has increased in size (0.6 cm to 1.1 cm).  There was progressive masslike architectural distortion within the perihilar left lung and lingula. There were surrounding areas of fibrosis and ground-glass attenuation.  These findings may reflect progressive post treatment changes. In the absence of interval treatment progressive neoplasm can not be excluded.  There was no residual or recurrent adenopathy within the abdomen  or pelvis.  The rright paraspinal soft tissue nodule was not confidently visualized.  She has a hiatal hernia and aortic atherosclerosis.    She has iron deficiency anemia.  Ferritin has been followed:  14 on 05/06/2018, 23 on 07/26/2018, and 59 on 11/01/2018.  She denies any bleeding.  She has a grade I neuropathy.  B12 and folate were normal on 12/06/2017.  She is deaf and requires an interpreter.  Symptomatically, she is doing well.  She denies any B symptoms.  She denies any respiratory symptoms.  She has some mild urinary  frequency at night.  Exam is unremarkable.  Plan: 1.   Labs today:  CBC with diff, CMP, LDH, uric acid, ferritin.  2.   [place] C[place] zone lymphoma Clinically doing well. No side effects of treatment. Begin cycle #7 pembrolizumab today. Discuss symptom management.  She has antiemetics and pain medications at home to use on a prn bases.  Interventions are adequate.     3.   Squamous cell lung cancer  Patient denies any respiratory symptoms.  Begin cycle #7 pembrolizumab today.  Anticipate restaging studies after next cycle (delays secondary to COVID-19 pandemic).  4.   Chemotherapy-induced neuropathy Patient notes only residual left heel numbness. Continue low-dose gabapentin.  5.   Normocytic anemia Resolved. Hemoglobin 12.9, hematocrit 38.2, MCV 95, and platelets 279,000. Ferritin 59 today. Continue iron rich foods. 6.   B12 deficiency Patient is on B12 500 mcg daily.  Anticipate B12 and folate check in 11/2018. 7.   Pain  Patient has knee pain unrelated to malignancy.  Pain managed by Dr. Elio Forget. 8.   Urinary frequency  Daughter notes urinary frequency at night.    Check UA and culture. 9.   RTC in 3 weeks for MD assessment, labs (CBC with diff, CMP, LDH, uric acid, TSH), and cycle #8 pembrolizumab.   Lequita Asal, MD  11/01/18, 9:50 AM

## 2018-11-03 LAB — URINE CULTURE: Culture: >=100000 — AB

## 2018-11-04 ENCOUNTER — Telehealth: Payer: Self-pay

## 2018-11-04 ENCOUNTER — Other Ambulatory Visit: Payer: Self-pay | Admitting: Hematology and Oncology

## 2018-11-04 MED ORDER — AMOXICILLIN-POT CLAVULANATE 875-125 MG PO TABS: 1.0000 | ORAL_TABLET | Freq: Two times a day (BID) | ORAL | 0 refills | Status: AC

## 2018-11-04 NOTE — Telephone Encounter (Signed)
Spoke with Toni Toni Griffith to inform her that Toni Toni Griffith was positive for a UTI. And per Dr Mike Gip to see if she been prescribed any antiboitic in the past. Per Toni Toni Griffith she has had a Amoxicillin Clavulanate - pot 875 / 125 mg BID x 4 days which she had for a prior UTI in the early part of last year. Toni Toni Griffith was ok with whatever Dr Mike Gip will prescribe for her mother UTI. Toni Griffith was understanding and agreeable.

## 2018-11-12 ENCOUNTER — Other Ambulatory Visit: Payer: Self-pay | Admitting: *Deleted

## 2018-11-12 DIAGNOSIS — E876 Hypokalemia: Secondary | ICD-10-CM

## 2018-11-12 DIAGNOSIS — C8198 Hodgkin lymphoma, unspecified, lymph nodes of multiple sites: Secondary | ICD-10-CM

## 2018-11-12 DIAGNOSIS — C8330 Diffuse large B-cell lymphoma, unspecified site: Secondary | ICD-10-CM

## 2018-11-12 DIAGNOSIS — C8580 Other specified types of non-Hodgkin lymphoma, unspecified site: Principal | ICD-10-CM

## 2018-11-12 DIAGNOSIS — C7951 Secondary malignant neoplasm of bone: Secondary | ICD-10-CM

## 2018-11-12 DIAGNOSIS — R911 Solitary pulmonary nodule: Secondary | ICD-10-CM

## 2018-11-12 MED ORDER — ALLOPURINOL 300 MG PO TABS: 300.0000 mg | ORAL_TABLET | Freq: Every day | ORAL | 0 refills | Status: DC

## 2018-11-12 NOTE — Telephone Encounter (Signed)
CBC with Differential/Platelet  Order: [id number]  Status:  Final result Visible to patient:  Yes (MyChart) Next appt:  11/22/2018 at 09:30 AM in Oncology ([place] 3) Dx:  Squamous cell carcinoma of lung, unsp...   Ref Range & Units 11d ago 21mo ago 20mo ago  WBC 4.0 - 10.5 K/uL 8.1  7.7  8.2   RBC 3.87 - 5.11 MIL/uL 4.02  4.05  3.92   Hemoglobin 12.0 - 15.0 g/dL 12.9  12.6  11.7Low    HCT 36.0 - 46.0 % 38.2  37.5  35.4Low    MCV 80.0 - 100.0 fL 95.0  92.6  90.3   MCH 26.0 - 34.0 pg 32.1  31.1  29.8   MCHC 30.0 - 36.0 g/dL 33.8  33.6  33.1   RDW 11.5 - 15.5 % 15.9High   18.4High   20.0High    Platelets 150 - 400 K/uL 279  341  341   nRBC 0.0 - 0.2 % 0.0  0.0  0.0   Neutrophils Relative % % 72  73  70   Neutro Abs 1.7 - 7.7 K/uL 5.9  5.6  5.6   Lymphocytes Relative % 13  11  10    Lymphs Abs 0.7 - 4.0 K/uL 1.1  0.9  0.9   Monocytes Relative % 11  12  15    Monocytes Absolute 0.1 - 1.0 K/uL 0.9  0.9  1.3High    Eosinophils Relative % 2  3  4    Eosinophils Absolute 0.0 - 0.5 K/uL 0.2  0.2  0.3   Basophils Relative % 1  1  1    Basophils Absolute 0.0 - 0.1 K/uL 0.1  0.1  0.1   Immature Granulocytes % 1  0  0   Abs Immature Granulocytes 0.00 - 0.07 K/uL 0.04  0.03 CM 0.03 CM  Comment: Performed at [facility], [address], [place], [place] [address]  Resulting Agency  [facility] CLIN LAB [facility] CLIN LAB [place] [facility]      Specimen Collected: 11/01/18 09:35 Last Resulted: 11/01/18 09:42     Lab Flowsheet    Order Details    View Encounter    Lab and Collection Details    Routing    Result History      CM=Additional comments      Other Results from 11/01/2018   Contains abnormal data Urinalysis, Complete w Microscopic  Order: [id number]   Status:  Final result Visible to patient:  Yes (MyChart) Next appt:  11/22/2018 at 09:30 AM in Oncology ([facility]) Dx:  Urine frequency   Ref Range & Units 11d ago (11/01/18) 67yr ago (04/28/16) 60yr  ago (04/23/16)  Color, Urine YELLOW YELLOW  YELLOWAbnormal   YELLOWAbnormal    APPearance CLEAR CLOUDYAbnormal   HAZYAbnormal   HAZYAbnormal    Specific Gravity, Urine 1.005 - 1.030 1.015  1.014  1.015   pH 5.0 - 8.0 5.0  5.0  5.0   Glucose, UA NEGATIVE mg/dL NEGATIVE  >500Abnormal   NEGATIVE   Hgb urine dipstick NEGATIVE NEGATIVE  NEGATIVE  NEGATIVE   Bilirubin Urine NEGATIVE NEGATIVE  NEGATIVE  NEGATIVE   Ketones, ur NEGATIVE mg/dL NEGATIVE  NEGATIVE  NEGATIVE   Protein, ur NEGATIVE mg/dL NEGATIVE  30Abnormal   100Abnormal    Nitrite NEGATIVE POSITIVEAbnormal   NEGATIVE  NEGATIVE   Leukocytes,Ua NEGATIVE TRACEAbnormal      Squamous Epithelial / LPF 0 - 5 11-20  6-30Abnormal  R 0-5Abnormal  R  WBC, UA 0 - 5 WBC/hpf 6-10  0-5  0-5   RBC / HPF 0 - 5 RBC/hpf NONE SEEN  0-5  0-5   Bacteria, UA NONE SEEN MANYAbnormal   RAREAbnormal   RAREAbnormal    Ca Oxalate Crys, UA  PRESENT     Comment: Performed at [facility], [address] [place], [place] [address]  Resulting Agency  [facility] CLIN LAB [facility] LAB [facility] CL[facility] LAB      Specimen Collected: 11/01/18 12:33 Last Resulted: 11/01/18 12:51     Lab Flowsheet    Order Details    View Encounter    Lab and Collection Details    Routing    Result History      R=Reference range differs from displayed range        Contains abnormal data Urine Culture  Order: [id number]   Status:  Final result Visible to patient:  Yes (MyChart) Next appt:  11/22/2018 at 09:30 AM in Oncology ([place] [facility]) Dx:  Urine frequency  Specimen Information: Urine, Clean Catch     Component 11d ago  Specimen Description URINE, CLEAN CATCH  Performed at [facility], [address], [place], [place] [address]   Special Requests NONE  Performed at [facility], [address] [place], [place] [address]   Culture >=100,000 COLONIES/mL KLEBSIELLA PNEUMONIAEAbnormal    Report Status 11/03/2018 FINAL    Organism ID, Bacteria KLEBSIELLA PNEUMONIAEAbnormal    Resulting Agency [facility]IN LAB  Susceptibility    Klebsiella pneumoniae    MIC    AMPICILLIN RESISTANT  Resistant    AMPICILLIN/SULBACTAM <=2 SENSITIVE  Sensitive    CEFAZOLIN <=4 SENSITIVE  Sensitive    CEFTRIAXONE <=1 SENSITIVE  Sensitive    CIPROFLOXACIN <=0.25 SENS... Sensitive    Extended ESBL NEGATIVE  Sensitive    GENTAMICIN <=1 SENSITIVE  Sensitive    IMIPENEM 0.5 SENSITIVE  Sensitive    NITROFURANTOIN 64 INTERMED... Intermediate    PIP/TAZO <=4 SENSITIVE  Sensitive    TRIMETH/SULFA <=20 SENSIT... Sensitive         Susceptibility Comments   Klebsiella pneumoniae  >=100,000 COLONIES/mL KLEBSIELLA PNEUMONIAE      Specimen Collected: 11/01/18 12:33 Last Resulted: 11/03/18 08:51     Lab Flowsheet    Order Details    View Encounter    Lab and Collection Details    Routing    Result History            Ferritin  Order: [id number]   Status:  Final result Visible to patient:  Yes (MyChart) Next appt:  11/22/2018 at 09:30 AM in Oncology ([place] 3) Dx:  Squamous cell carcinoma of lung, unsp...   Ref Range & Units 11d ago (11/01/18) 58mo ago (09/06/18) 45mo ago (08/16/18)  Ferritin 11 - 307 ng/mL 59  31 CM 38 CM  Comment: Performed at [facility], [address] [place], [place] [address]  Resulting Agency  [facility] CLIN LAB [facility] CLIN LAB [facility] LAB      Specimen Collected: 11/01/18 09:35 Last Resulted: 11/01/18 13:02     Lab Flowsheet    Order Details    View Encounter    Lab and Collection Details    Routing    Result History      CM=Additional comments        Uric acid  Order: [id number]   Status:  Final result Visible to patient:  Yes (MyChart)  Next appt:  11/22/2018 at 09:30 AM in Oncology ([place] 3) Dx:  Squamous cell carcinoma of lung, unsp...   Ref Range & Units 11d ago 21mo ago 70mo ago  Uric Acid, Serum 2.5 - 7.1 mg/dL 3.6  4.1 CM 3.4 CM   Comment: Performed at [facility], [address], [place], [place] [address]  Resulting Agency  [facility] CLIN LAB [facility] CLIN LAB [facility] CLIN LAB      Specimen Collected: 11/01/18 09:35 Last Resulted: 11/01/18 09:54     Lab Flowsheet    Order Details    View Encounter    Lab and Collection Details    Routing    Result History      CM=Additional comments        Lactate dehydrogenase  Order: [id number]   Status:  Final result Visible to patient:  Yes (MyChart) Next appt:  11/22/2018 at 09:30 AM in Oncology ([place] 3) Dx:  Squamous cell carcinoma of lung, unsp...   Ref Range & Units 11d ago 34mo ago 78mo ago  LDH 98 - 192 U/L 116  123 CM 117 CM  Comment: Performed at [facility], [address] [place], [place] [address]  Resulting Agency  [facility] CLIN LAB [facility] CLIN LAB [facility] LAB      Specimen Collected: 11/01/18 09:35 Last Resulted: 11/01/18 09:54     Lab Flowsheet    Order Details    View Encounter    Lab and Collection Details    Routing    Result History      CM=Additional comments        TSH  Order: [id number]   Status:  Final result Visible to patient:  Yes (MyChart) Next appt:  11/22/2018 at 09:30 AM in Oncology ([place] 3) Dx:  Squamous cell carcinoma of lung, unsp...   Ref Range & Units 11d ago 75mo ago 46mo ago  TSH 0.350 - 4.500 uIU/mL 1.523  1.942 CM 1.911 CM  Comment: Performed by a 3rd Generation assay with a functional sensitivity of <=0.01 uIU/mL.  Performed at [facility], [address] [place], [place] [address]   Resulting Agency  [facility] CLIN LAB [facility] CLIN LAB [facility] CLIN LAB      Specimen Collected: 11/01/18 09:35 Last Resulted: 11/01/18 13:02     Lab Flowsheet    Order Details    View Encounter    Lab and Collection Details    Routing    Result History      CM=Additional comments        Contains abnormal data Comprehensive metabolic panel  Order: [id number]    Status:  Final result Visible to patient:  Yes (MyChart) Next appt:  11/22/2018 at 09:30 AM in Oncology ([place] 3) Dx:  Squamous cell carcinoma of lung, unsp...   Ref Range & Units 11d ago 25mo ago 28mo ago  Sodium 135 - 145 mmol/L 131Low   135  136   Potassium 3.5 - 5.1 mmol/L 3.5  3.6  3.6   Chloride 98 - 111 mmol/L 101  104  107   CO2 22 - 32 mmol/L 22  22  25    Glucose, Bld 70 - 99 mg/dL 156High   142High   121High    BUN 8 - 23 mg/dL 22  21  20    Creatinine, Ser 0.44 - 1.00 mg/dL 0.96  0.82  0.90   Calcium 8.9 - 10.3 mg/dL 9.1  9.1  9.0   Total Protein 6.5 - 8.1 g/dL 7.1  7.0  6.7   Albumin 3.5 - 5.0 g/dL 3.3Low   3.1Low   2.9Low    AST 15 - 41 U/L 26  30  26    ALT 0 - 44 U/L 20  20  18    Alkaline Phosphatase 38 - 126 U/L 73  67  69   Total Bilirubin 0.3 - 1.2 mg/dL 1.2  0.8  0.6   GFR calc non Af Amer >60 mL/min 58Low   >60  >60   GFR calc Af Amer >60 mL/min >60  >60  >60   Anion gap 5 - 15 8  9  CM 4Low  CM  Comment: Performed at [facility], [address] [place], [place] [address]  Resulting Agency  [facility] CLIN LAB [facility] CLIN LAB [facility] CLIN LAB      Specimen Collected: 11/01/18 09:35 Last Resulted: 11/01/18 09:54

## 2018-11-22 ENCOUNTER — Inpatient Hospital Stay (HOSPITAL_BASED_OUTPATIENT_CLINIC_OR_DEPARTMENT_OTHER): Payer: Medicare HMO | Admitting: Oncology

## 2018-11-22 ENCOUNTER — Inpatient Hospital Stay: Payer: Medicare HMO

## 2018-11-22 ENCOUNTER — Other Ambulatory Visit: Payer: Self-pay

## 2018-11-22 ENCOUNTER — Other Ambulatory Visit: Payer: Self-pay | Admitting: Hematology and Oncology

## 2018-11-22 VITALS — BP 134/60 | HR 96 | Temp 97.6°F | Resp 16 | Wt 162.7 lb

## 2018-11-22 DIAGNOSIS — E860 Dehydration: Secondary | ICD-10-CM

## 2018-11-22 DIAGNOSIS — G620 Drug-induced polyneuropathy: Secondary | ICD-10-CM

## 2018-11-22 DIAGNOSIS — C786 Secondary malignant neoplasm of retroperitoneum and peritoneum: Secondary | ICD-10-CM

## 2018-11-22 DIAGNOSIS — D509 Iron deficiency anemia, unspecified: Secondary | ICD-10-CM

## 2018-11-22 DIAGNOSIS — N3000 Acute cystitis without hematuria: Secondary | ICD-10-CM

## 2018-11-22 DIAGNOSIS — C8590 Non-Hodgkin lymphoma, unspecified, unspecified site: Secondary | ICD-10-CM

## 2018-11-22 DIAGNOSIS — E871 Hypo-osmolality and hyponatremia: Secondary | ICD-10-CM

## 2018-11-22 DIAGNOSIS — Z5112 Encounter for antineoplastic immunotherapy: Secondary | ICD-10-CM

## 2018-11-22 DIAGNOSIS — R350 Frequency of micturition: Secondary | ICD-10-CM

## 2018-11-22 DIAGNOSIS — Z87891 Personal history of nicotine dependence: Secondary | ICD-10-CM

## 2018-11-22 DIAGNOSIS — C3490 Malignant neoplasm of unspecified part of unspecified bronchus or lung: Secondary | ICD-10-CM

## 2018-11-22 DIAGNOSIS — Z7982 Long term (current) use of aspirin: Secondary | ICD-10-CM

## 2018-11-22 DIAGNOSIS — H9190 Unspecified hearing loss, unspecified ear: Secondary | ICD-10-CM

## 2018-11-22 DIAGNOSIS — Z79899 Other long term (current) drug therapy: Secondary | ICD-10-CM

## 2018-11-22 DIAGNOSIS — C8330 Diffuse large B-cell lymphoma, unspecified site: Secondary | ICD-10-CM

## 2018-11-22 DIAGNOSIS — M25469 Effusion, unspecified knee: Secondary | ICD-10-CM

## 2018-11-22 DIAGNOSIS — C3432 Malignant neoplasm of lower lobe, left bronchus or lung: Secondary | ICD-10-CM

## 2018-11-22 DIAGNOSIS — C8518 Unspecified B-cell lymphoma, lymph nodes of multiple sites: Secondary | ICD-10-CM

## 2018-11-22 DIAGNOSIS — M25562 Pain in left knee: Secondary | ICD-10-CM

## 2018-11-22 DIAGNOSIS — Z791 Long term (current) use of non-steroidal anti-inflammatories (NSAID): Secondary | ICD-10-CM

## 2018-11-22 DIAGNOSIS — E538 Deficiency of other specified B group vitamins: Secondary | ICD-10-CM

## 2018-11-22 DIAGNOSIS — M25561 Pain in right knee: Secondary | ICD-10-CM

## 2018-11-22 LAB — COMPREHENSIVE METABOLIC PANEL
ALT: 17 U/L (ref 0–44)
AST: 28 U/L (ref 15–41)
Albumin: 3.2 g/dL — ABNORMAL LOW (ref 3.5–5.0)
Alkaline Phosphatase: 80 U/L (ref 38–126)
Anion gap: 7 (ref 5–15)
BUN: 26 mg/dL — ABNORMAL HIGH (ref 8–23)
CO2: 21 mmol/L — ABNORMAL LOW (ref 22–32)
Calcium: 8.9 mg/dL (ref 8.9–10.3)
Chloride: 105 mmol/L (ref 98–111)
Creatinine, Ser: 1.04 mg/dL — ABNORMAL HIGH (ref 0.44–1.00)
GFR calc Af Amer: >60 mL/min (ref >60)
GFR calc non Af Amer: 53 mL/min — ABNORMAL LOW (ref >60)
Glucose, Bld: 150 mg/dL — ABNORMAL HIGH (ref 70–99)
Potassium: 3.7 mmol/L (ref 3.5–5.1)
Sodium: 133 mmol/L — ABNORMAL LOW (ref 135–145)
Total Bilirubin: 0.9 mg/dL (ref 0.3–1.2)
Total Protein: 7.2 g/dL (ref 6.5–8.1)

## 2018-11-22 LAB — CBC WITH DIFFERENTIAL/PLATELET
Abs Immature Granulocytes: 0.03 10*3/uL (ref 0.00–0.07)
Basophils Absolute: 0.1 10*3/uL (ref 0.0–0.1)
Basophils Relative: 1 %
Eosinophils Absolute: 0.2 10*3/uL (ref 0.0–0.5)
Eosinophils Relative: 3 %
HCT: 37.8 % (ref 36.0–46.0)
Hemoglobin: 12.7 g/dL (ref 12.0–15.0)
Immature Granulocytes: 0 %
Lymphocytes Relative: 13 %
Lymphs Abs: 1.1 10*3/uL (ref 0.7–4.0)
MCH: 32.7 pg (ref 26.0–34.0)
MCHC: 33.6 g/dL (ref 30.0–36.0)
MCV: 97.4 fL (ref 80.0–100.0)
Monocytes Absolute: 1.1 10*3/uL — ABNORMAL HIGH (ref 0.1–1.0)
Monocytes Relative: 13 %
Neutro Abs: 5.9 10*3/uL (ref 1.7–7.7)
Neutrophils Relative %: 70 %
Platelets: 280 10*3/uL (ref 150–400)
RBC: 3.88 MIL/uL (ref 3.87–5.11)
RDW: 15.7 % — ABNORMAL HIGH (ref 11.5–15.5)
WBC: 8.4 10*3/uL (ref 4.0–10.5)
nRBC: 0 % (ref 0.0–0.2)

## 2018-11-22 LAB — URINALYSIS, COMPLETE (UACMP) WITH MICROSCOPIC
Glucose, UA: NEGATIVE mg/dL
Hgb urine dipstick: NEGATIVE
Nitrite: NEGATIVE
Protein, ur: NEGATIVE mg/dL
Specific Gravity, Urine: 1.015 (ref 1.005–1.030)
pH: 5 (ref 5.0–8.0)

## 2018-11-22 LAB — TSH: TSH: 1.901 u[IU]/mL (ref 0.350–4.500)

## 2018-11-22 LAB — URIC ACID: Uric Acid, Serum: 3.2 mg/dL (ref 2.5–7.1)

## 2018-11-22 LAB — LACTATE DEHYDROGENASE: LDH: 110 U/L (ref 98–192)

## 2018-11-22 MED ORDER — SODIUM CHLORIDE 0.9 % IV SOLN
Freq: Once | INTRAVENOUS | Status: AC
  Administered 2018-11-22: 12:00:00 via INTRAVENOUS
  Filled 2018-11-22: qty 250

## 2018-11-22 MED ORDER — SODIUM CHLORIDE 0.9 % IV SOLN
Freq: Once | INTRAVENOUS | Status: AC
  Administered 2018-11-22: 11:00:00 via INTRAVENOUS
  Filled 2018-11-22: qty 250

## 2018-11-22 MED ORDER — HEPARIN SOD (PORK) LOCK FLUSH 100 UNIT/ML IV SOLN
500.0000 [IU] | Freq: Once | INTRAVENOUS | Status: AC
  Administered 2018-11-22: 13:00:00 500 [IU] via INTRAVENOUS
  Filled 2018-11-22: qty 5

## 2018-11-22 MED ORDER — ONDANSETRON HCL 4 MG PO TABS
8.0000 mg | ORAL_TABLET | Freq: Once | ORAL | Status: AC
  Administered 2018-11-22: 8 mg via ORAL
  Filled 2018-11-22: qty 2

## 2018-11-22 MED ORDER — SODIUM CHLORIDE 0.9% FLUSH
10.0000 mL | INTRAVENOUS | Status: DC | PRN
  Administered 2018-11-22: 10:00:00 10 mL via INTRAVENOUS
  Filled 2018-11-22: qty 10

## 2018-11-22 MED ORDER — SODIUM CHLORIDE 0.9 % IV SOLN
200.0000 mg | Freq: Once | INTRAVENOUS | Status: AC
  Administered 2018-11-22: 11:00:00 200 mg via INTRAVENOUS
  Filled 2018-11-22: qty 8

## 2018-11-22 NOTE — Progress Notes (Signed)
Patient here for follow up. Denies any concerns.  

## 2018-11-22 NOTE — Progress Notes (Signed)
[facility]     [address]     [place], [place] [address]     Phone: [phone]      Fax: [phone]       Clinic day:  11/22/18  Chief Complaint: Toni Griffith is a 78 y.o. 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  HPI:  The patient was last seen in the medical oncology clinic on 11/01/2018.  At that time, she was doing well.  She denied any B symptoms.  She denied any respiratory symptoms. She admitted to increased urinary frequency. She was started on Augmentin x 4 days.  She received cycle #7 pembrolizumab.  During the interim, he has felt "pretty good".  She denies any new problems.  She denies any B symptoms.  Weight is stable.  She denies any infections.  She denies any adenopathy, bruising or bleeding.  She denies any respiratory symptoms. Her main complaint is bilateral knee pain and swelling that is chronic in nature. Urinary frequency has improved.  Past Medical History:  Diagnosis Date   Arthritis    Cancer ([place])    lymphoma-right hip   Deaf    GERD (gastroesophageal reflux disease)    Hypertension    NOT ON MED AT PRESENT    Past Surgical History:  Procedure Laterality Date   CARDIAC SURGERY  1993   CORONARY ARTERY BYPASS GRAFT     DOUBLE   CYST REMOVAL HAND Right    ENDOBRONCHIAL ULTRASOUND N/A 05/30/2018   Procedure: ENDOBRONCHIAL ULTRASOUND;  Surgeon: Tyler Pita, MD;  Location: [facility] ORS;  Service: Cardiopulmonary;  Laterality: N/A;   PERIPHERAL VASCULAR CATHETERIZATION N/A 04/06/2016   Procedure: Glori Luis Cath Insertion;  Surgeon: Algernon Huxley, MD;  Location: [facility];  Service: Cardiovascular;  Laterality: N/A;    Family History  Problem Relation Age of Onset   Lung cancer Mother    Lung cancer Father    Diabetes Maternal Uncle    Myasthenia gravis Maternal Grandmother    Leukemia  Maternal Grandfather     Social History:  reports that she quit smoking about 3 years ago. Her smoking use included cigarettes. She has a 80.00 pack-year smoking history. She has never used smokeless tobacco. She reports current alcohol use. She reports that she does not use drugs.  She smoked 1 1/2 packs per day for 20 years (30 pack year smoking history) until 10/2015.  She is hearing impaired (deaf).  She lives in [place] with her boyfriend.  Her daughter lives in [place].  Her daughter, [name]'s contact number is [phone].  She is living with her son.  The patient is accompanied by an [place] interpreter.  Her daughter is outside on the phone secondary to the COVID-19 pandemic.  Allergies: No Known Allergies  Current Medications: Current Outpatient Medications  Medication Sig Dispense Refill   acetaminophen (TYLENOL) 500 MG tablet Take 500 mg by mouth every 6 (six) hours as needed for moderate pain.      allopurinol (ZYLOPRIM) 300 MG tablet Take 1 tablet (300 mg total) by mouth daily. 90 tablet 0   aspirin EC 81 MG tablet Take 81 mg by mouth daily.     atorvastatin (LIPITOR) 80 MG tablet Take 80 mg by mouth every evening.      b complex vitamins tablet Take 1 tablet by mouth daily.  Cholecalciferol (VITAMIN D3) 2000 units capsule Take 2,000 Units by mouth daily.     diclofenac sodium (VOLTAREN) 1 % GEL APPLY 2 G TOPICALLY 3 (THREE) TIMES DAILY AS NEEDED (LEFT KNEE). 100 g 1   ferrous sulfate 325 (65 FE) MG tablet Take 650 mg by mouth daily with breakfast.     FLUoxetine (PROZAC) 40 MG capsule Take 40 mg by mouth daily.      gabapentin (NEURONTIN) 100 MG capsule Take 2 capsules (200 mg total) by mouth at bedtime. 60 capsule 3   HYDROcodone-acetaminophen (NORCO) 5-325 MG tablet Take 1 tablet by mouth every 6 (six) hours as needed for moderate pain. 30 tablet 0   lidocaine-prilocaine (EMLA) cream Apply cream 1 hour before chemotherapy treatment and place small peive of saran  wrap over cream to protect clothing 30 g 3   Magnesium 250 MG TABS Take 1 tablet by mouth daily.     ondansetron (ZOFRAN ODT) 4 MG disintegrating tablet Take 1 tablet (4 mg total) by mouth every 8 (eight) hours as needed for nausea or vomiting. 30 tablet 1   pantoprazole (PROTONIX) 40 MG tablet Take 40 mg by mouth daily.      vitamin B-12 (CYANOCOBALAMIN) 500 MCG tablet Take 500 mcg by mouth daily.     vitamin E 400 UNIT capsule Take 400 Units by mouth daily.      No current facility-administered medications for this visit.    Facility-Administered Medications Ordered in Other Visits  Medication Dose Route Frequency Provider Last Rate Last Dose   heparin lock flush 100 unit/mL  500 Units Intravenous Once Corcoran, Melissa C, MD       sodium chloride flush (NS) 0.9 % injection 10 mL  10 mL Intravenous PRN Lequita Asal, MD   10 mL at 04/05/17 1034    Review of Systems  Constitutional: Positive for malaise/fatigue. Negative for chills, fever and weight loss.  HENT: Positive for hearing loss. Negative for congestion, ear pain and tinnitus.   Eyes: Negative.  Negative for blurred vision and double vision.  Respiratory: Negative.  Negative for cough, sputum production and shortness of breath.   Cardiovascular: Negative.  Negative for chest pain, palpitations and leg swelling.  Gastrointestinal: Negative.  Negative for abdominal pain, constipation, diarrhea, nausea and vomiting.  Genitourinary: Negative for dysuria, frequency and urgency.  Musculoskeletal: Positive for joint pain and myalgias. Negative for back pain and falls.  Skin: Negative.  Negative for rash.  Neurological: Positive for weakness. Negative for headaches.  Endo/Heme/Allergies: Negative.  Does not bruise/bleed easily.  Psychiatric/Behavioral: Negative.  Negative for depression. The patient is not nervous/anxious and does not have insomnia.    Performance status (ECOG): 1  Vital Signs BP 134/60 (BP Location:  Left Arm, Patient Position: Sitting)    Pulse 96    Temp 97.6 F (36.4 C) (Tympanic)    Resp 16    Wt 162 lb 11.2 oz (73.8 kg)    BMI 31.78 kg/m   Physical Exam  Constitutional: She is oriented to person, place, and time.  Well appearing woman sitting comfortably in a wheelchair in no acute distress.  HENT:  Head: Normocephalic and atraumatic.  Mouth/Throat: Oropharynx is clear and moist. No oropharyngeal exudate.  Short gray hair.  Dentures (loose).  Eyes: Pupils are equal, round, and reactive to light. Conjunctivae and EOM are normal. No scleral icterus.  Hazel eyes.  Neck: Normal range of motion. Neck supple. No JVD present.  Cardiovascular: Normal rate, regular rhythm and  normal heart sounds. Exam reveals no gallop and no friction rub.  No murmur heard. Pulmonary/Chest: Effort normal and breath sounds normal. No respiratory distress. She has no wheezes. She has no rales.  Abdominal: Soft. Bowel sounds are normal. She exhibits no distension and no mass. There is no abdominal tenderness. There is no rebound and no guarding.  Musculoskeletal: Normal range of motion.        General: Edema (R knee> Left. Chronic ) present. No tenderness.  Lymphadenopathy:    She has no cervical adenopathy.    She has no axillary adenopathy.       Right: No supraclavicular adenopathy present.       Left: No supraclavicular adenopathy present.  Neurological: She is alert and oriented to person, place, and time.  Skin: Skin is warm and dry. No rash noted. She is not diaphoretic. No erythema. No pallor.  Psychiatric: Mood, affect and judgment normal.  Nursing note and vitals reviewed.   No results found for this or any previous visit (from the past 168 hour(s)).   Admissions: 02/20/2016 - 02/26/2016:  [facility] in [facility]:  She had renal failure and hypercalcemia. She was treated with IVF, calcitonin and Zometa.The patient has a history of coronary artery disease s/p CABG in 1993.  She has no  history of heart failure.  01/20/2018 - 01/23/2018:  [facility]  She presented with nausea, vomiting, diarrhea and generalized weakness. Work-up showed hypokalemia, hypomagnesemia, dehydration and UTI.  She was treated with IV fluids and empiric IV antibiotics. Potassium and magnesium were repleted. Urine culture showed E. coli.  She was discharged on Augmentin. She was switched from HCTZ to chorthalidone. 02/01/2018 - 02/03/2018:  [facility] with hyponatremia.   Radiology studies: 02/20/2016:  Chest, abdomen and pelvic CT revealed a 3.7 cm subpleural mass within the left lower lobe, a 1.1 cm right lower lobe subpleural nodule , 9 mm RUL nodule, and 0.4 cm lingular nodule.  Between the left second, third, and fourth ribs there was 1.9 cm soft tissue mass. There was a 2.5 cm low-attenuation nodule in the left lobe of the thyroid.   There were prominent left axillary nodes (up to 1.0 cm).   There was no definite mediastinal adenopathy. There was a 5.5 x 4.6 cm cystic lesion in the left hepatic lobe.  There was a 3.1 cm nodule in the right adrenal gland. There was a 1.2 cm right retrocaval lymph node with additional prominent subcentimeter retroperitoneal nodes. There was 1.0 cm left common iliac node. There was a 6.2 x 5.2 cm soft tissue mass within the right ilium.  02/21/2016:  Head MRI revealed no evidence of malignancy.  02/21/2016:  Thyroid ultrasound revealed a 2.4 cm solid nodule in the inferior aspect of the left lobe 03/03/2016:  PET scan revealed a hypermetabolic left lower lobe mass, bilateral pleural and pulmonary parenchymal nodular metastasis, a large pleural lesion invading the anterior left chest wall.   There was activity in the superior left ocular orbit.  There was a large lesion centered in the right iliac wing with soft tissue extension.  There were hypermetabolic right external iliac lymph nodes 06/06/2016:  PET scan revealed significant interval  response to therapy.  There was significant decrease in size and FDG uptake associated with left lower lobe lung lesion.  There was resolution of left-sided pleural base metastasis and left axillary and anterior mediastinal nodal metastasis.  There was resolution of previous hypermetabolic soft tissue  metastasis/nodal metastasis posterior to the IVC.  There was marked improvement in hypermetabolic metastasis involving the right iliac wing.  There was persistent right upper lobe lung nodule which exhibits intense radiotracer uptake which may represent a focus of metastatic disease or synchronous primary lung neoplasm. 06/01/2017:  PET scan revealed no new or progressive findings.  The 9 mm RUL pulmonary nodule remained hypermetabolic (SUV 28.3, previously 7.3) and no change in size.  The index lesion in the posterior left lower lobe was minimally smaller on CT imaging with very low level FDG uptake (SUV 1.6).  There was no change in the appearance at site of previous disease at the right iliac wing (SUV 1.9 compared to 2.6 previously). 01/29/2017:  PET scan revealed interval progression of hypermetabolic nodules in the right upper (13 mm compared to 9 mm; SUV 10) and left lower lobes (26 mm compared to 22 mm; SUV 7.5).  There was new hypermetabolic focus of activity along the anterior left pleura although no underlying pleural or lung mass was evident. 09/18/2017:  PET scan revealed a mixed appearance, with marked improvement in the prior 2 pulmonary nodules; but with new Deauville 5 left common iliac lymph node enlargement; new Deauville 5 right paraspinal lesion; and new Deauville 4 left infrahilar nodal activity.  There was a focus of accentuated metabolic activity which had been present along the right lower submandibular gland but currently seemed to project over the subcutaneous tissues without CT correlate. Current maximum SUV 10.5, previously 9.7.  There was increased accentuated activity along the left  lateral spurring at L2-3. While typically such activity is inflammatory due to spurring, this had increased significantly since the prior exam and merit surveillance. There was a new 8 by 5 mm right upper lobe pulmonary nodule, Deauville 2, which also merits surveillance. 01/29/2018:  PET scan revealed persistent and progressive areas of hypermetabolism within the right submandibular gland, lingula of the left lung, left common iliac nodal chain, and right lumbar paraspinal soft tissues. Findings were compatible with Deauville criteria 4 and 5.  There was a new nodule is identified within the lingula which demonstrated intense uptake compatible with Deauville criteria 5.  05/14/2018:  Chest, abdomen, and pelvic CT revealed enlarging mediastinal adenopathy and right upper lobe nodule, indicative of disease progression. Necrotic lingular nodules were grossly stable in size with developing surrounding collapse/consolidation, possibly treatment related.  There was slight decrease in size of a left external iliac lymph node and right lumbar paraspinal soft tissue lesion.  There was a tiny left pleural effusion. 06/24/2018:  Head MRI revealed no evidence of intracranial metastasis. 09/03/2018:  Chest, abdomen, and pelvic CT revealed interval decrease in size of previous index right paratracheal and left hilar lymph nodes. Left-sided pre-vascular lymph nodes have also decreased in size in the interval. There was a high left paratracheal lymph node however which has increased in size (0.6 cm to 1.1 cm).  There was progressive masslike architectural distortion within the perihilar left lung and lingula. There were surrounding areas of fibrosis and ground-glass attenuation.  These findings may reflect progressive post treatment changes. In the absence of interval treatment progressive neoplasm can not be excluded.  There was no residual or recurrent adenopathy within the abdomen or pelvis.  The rright paraspinal soft  tissue nodule was not confidently visualized.  She has a hiatal hernia and aortic atherosclerosis.     Assessment:  Toni Griffith is a 78 y.o. female with stage IVB B-cell lymphoma, unclassifiable, with features intermediate  between diffuse large B-cell lymphoma and classical Hodgkin's lymphoma ("gray zone" lymphoma).  She underwent right iliac wing bone/bone marrow biopsy on 03/08/2016.  PET scan on 03/03/2016 revealed a hypermetabolic left lower lobe mass, bilateral pleural and pulmonary parenchymal nodular metastasis, a large pleural lesion invading the anterior left chest wall.   There was activity in the superior left ocular orbit.  There was a large lesion centered in the right iliac wing with soft tissue extension.  There were hypermetabolic right external iliac lymph nodes  Echo on 04/04/2016 revealed an EF of 55-60%.  Hepatitis B and C testing was negative on 03/30/2016.   G6PD assay was normal.  She declined LP with MTX prophylaxis.  She received 6 cycles of mini-RCHOP (04/07/2016 - 07/21/2016).  She has had some transient fingertip numbness.  She received radiation to the right iliac wing,  from 10/11/2016 - 11/01/2016.  She received 3000 cGy over 3 weeks.  PET scan on 01/29/2017 revealed interval progression of hypermetabolic nodules in the right upper (13 mm compared to 9 mm; SUV 10) and left lower lobes (26 mm compared to 22 mm; SUV 7.5).  There was new hypermetabolic focus of activity along the anterior left pleura although no underlying pleural or lung mass was evident.  She has squamous cell lung cancer.  Foundation One on the lung biopsy on 03/15/2018 revealed MS-stable, tumor mutational burden 5 Muts/Mb, AKT2 amplification, CCNE1 amplification, CREBBP R1446L, KDR amplification, KIT amplification, PDGFRA amplification, PTEN loss exons 2-4, and UJ81 splice site 191+4N>W. Marland Kitchen  There were no alterations in EGFR, KRAS, RET, ALK, MET, ERBB2, BRAF, ROS1.  PDL-1 IHC analysis revealed a  TPS score of 50%.  CT guided left lower lobe nodule biopsy on 02/19/2017 confirmed squamous cell carcinoma.  She underwent SBRT 03/26/2017 - 04/09/2017.  She received 5000 cGy in 5 fractions to the left lower lobe lesion.  She underwent SBRT to a RUL lesion from 05/23/2017 - 06/18/2017.  CT guided left upper lobe nodule biopsy on 02/20/2018 revealed squamous cell lung cancer.  She received SBRT from 04/09/2018 - 04/15/2018.   She received 6000 cGy in 5 fractions.   Bronchoscopy on 05/30/2018 revealed no endobronchial lesions.  The 2 cm pre-carinal lymph node was biopsied.  Pathology revealed metastatic non-small cell carcinoma, favor squamous cell carcinoma.  PET scan on 09/18/2017 revealed a mixed appearance, with marked improvement in the prior 2 pulmonary nodules; but with new Deauville 5 left common iliac lymph node enlargement; new Deauville 5 right paraspinal lesion; and new Deauville 4 left infrahilar nodal activity.  There was a focus of accentuated metabolic activity which had been present along the right lower submandibular gland but currently seemed to project over the subcutaneous tissues without CT correlate. Current maximum SUV 10.5, previously 9.7. There was increased accentuated activity along the left lateral spurring at L2-3. While typically such activity is inflammatory due to spurring, this had increased significantly since the prior exam and merit surveillance. There was a new 8 by 5 mm right upper lobe pulmonary nodule, Deauville 2.  CT guided right paraspinal lesion biopsy on 10/16/2017 was compatible with involvement by the patient's previously diagnosed lymphoma.  The case was referred to [facility] hematopathology.  The neoplastic cells were largely CD30 negative.  Repeat CD30 immunohistochemistry faintly stained > 10% of the large atypical cells. CD20 stained the scattered large atypical cells and few small cells.   She received 3 cycles of brentuximab vedotin (11/23/2017 -  01/11/2018).  PET scan on 01/29/2018 revealed  persistent and progressive areas of hypermetabolism within the right submandibular gland, lingula of the left lung, left common iliac nodal chain, and right lumbar paraspinal soft tissues. Findings were compatible with Deauville criteria 4 and 5.  There was a new nodule is identified within the lingula which demonstrated intense uptake compatible with Deauville criteria 5.   Chest, abdomen, and pelvic CT on 05/14/2018 revealed enlarging mediastinal adenopathy and right upper lobe nodule, indicative of disease progression. Necrotic lingular nodules were grossly stable in size with developing surrounding collapse/consolidation, possibly treatment related.  There was slight decrease in size of a left external iliac lymph node and right lumbar paraspinal soft tissue lesion.  There was a tiny left pleural effusion.  Head MRI on 06/24/2018 revealed no evidence of intracranial metastasis.  She is s/p 7 cycles of pembrolizumab (06/14/2018  - 11/01/2018).  Chest, abdomen, and pelvic CT on 09/03/2018 revealed interval decrease in size of previous index right paratracheal and left hilar lymph nodes. Left-sided pre-vascular lymph nodes have also decreased in size in the interval. There was a high left paratracheal lymph node however which has increased in size (0.6 cm to 1.1 cm).  There was progressive masslike architectural distortion within the perihilar left lung and lingula. There were surrounding areas of fibrosis and ground-glass attenuation.  These findings may reflect progressive post treatment changes. In the absence of interval treatment progressive neoplasm can not be excluded.  There was no residual or recurrent adenopathy within the abdomen or pelvis.  The rright paraspinal soft tissue nodule was not confidently visualized.  She has a hiatal hernia and aortic atherosclerosis.    She has iron deficiency anemia.  Ferritin has been followed:  14 on 05/06/2018,  23 on 07/26/2018, and 59 on 11/01/2018.  She denies any bleeding.  She has a grade I neuropathy.  B12 and folate were normal on 12/06/2017.  She is deaf and requires an interpreter.  Symptomatically, she is doing well.  She denies any B symptoms.  She denies any respiratory symptoms/concern. Mild knee swelling. R>L. Exam is unremarkable.  Plan: 1.   Labs today:  CBC with diff, CMP, LDH, uric acid, ferritin.  2.   Pearline Cables zone lymphoma Clinically doing well. No side effects of treatment. Begin cycle #8 pembrolizumab today. Discuss symptom management.  She has antiemetics and pain medications at home to use on a prn bases.  Interventions are adequate.     3.   Squamous cell lung cancer  Patient denies any respiratory symptoms.  Begin cycle #8 pembrolizumab today.  Anticipate restaging studies after next cycle (delays secondary to COVID-19 pandemic).  4.   Chemotherapy-induced neuropathy Patient notes only residual left heel numbness. Continue low-dose gabapentin.  5.   Normocytic anemia Resolved. Hemoglobin 12.7, hematocrit 37.8, MCV 97.4, and platelets 280,000. Ferritin 59 today. Continue iron rich foods. 6.   B12 deficiency Patient is on B12 500 mcg daily.  Anticipate B12 and folate check in 11/2018. 7.   Pain  Patient has knee pain unrelated to malignancy.  Notes mild swelling. No injury.   Pain managed by Dr. Elio Forget. 8.   Urinary frequency  Resolved.   Completed antibiotics.   Will re-check UA today for confirmation. 9. Hyponatremia  Mild Dehydration  Na 133, BUN 26 and Creatinine elevated from baseline.   Give 500 ml addition IV fluids with treatment today.   Push oral hydration 10.   RTC in 3 weeks for MD assessment, labs (CBC with diff, CMP, LDH, uric acid,  TSH), and cycle #9 pembrolizumab. 11. Per [name] will get imaging after Cycle 9 Keytruda.    Jacquelin Hawking, NP  12/03/18, 1:27 PM

## 2018-11-22 NOTE — Patient Instructions (Signed)
Pembrolizumab injection  What is this medicine?  PEMBROLIZUMAB (pem broe liz ue mab) is a monoclonal antibody. It is used to treat cervical cancer, esophageal cancer, head and neck cancer, hepatocellular cancer, Hodgkin lymphoma, kidney cancer, lymphoma, melanoma, Merkel cell carcinoma, lung cancer, stomach cancer, urothelial cancer, and cancers that have a certain genetic condition.  This medicine may be used for other purposes; ask your health care provider or pharmacist if you have questions.  COMMON BRAND NAME(S): Keytruda  What should I tell my health care provider before I take this medicine?  They need to know if you have any of these conditions:  -diabetes  -immune system problems  -inflammatory bowel disease  -liver disease  -lung or breathing disease  -lupus  -received or scheduled to receive an organ transplant or a stem-cell transplant that uses donor stem cells  -an unusual or allergic reaction to pembrolizumab, other medicines, foods, dyes, or preservatives  -pregnant or trying to get pregnant  -breast-feeding  How should I use this medicine?  This medicine is for infusion into a vein. It is given by a health care professional in a hospital or clinic setting.  A special MedGuide will be given to you before each treatment. Be sure to read this information carefully each time.  Talk to your pediatrician regarding the use of this medicine in children. While this drug may be prescribed for selected conditions, precautions do apply.  Overdosage: If you think you have taken too much of this medicine contact a poison control center or emergency room at once.  NOTE: This medicine is only for you. Do not share this medicine with others.  What if I miss a dose?  It is important not to miss your dose. Call your doctor or health care professional if you are unable to keep an appointment.  What may interact with this medicine?  Interactions have not been studied.  Give your health care provider a list of all the  medicines, herbs, non-prescription drugs, or dietary supplements you use. Also tell them if you smoke, drink alcohol, or use illegal drugs. Some items may interact with your medicine.  This list may not describe all possible interactions. Give your health care provider a list of all the medicines, herbs, non-prescription drugs, or dietary supplements you use. Also tell them if you smoke, drink alcohol, or use illegal drugs. Some items may interact with your medicine.  What should I watch for while using this medicine?  Your condition will be monitored carefully while you are receiving this medicine.  You may need blood work done while you are taking this medicine.  Do not become pregnant while taking this medicine or for 4 months after stopping it. Women should inform their doctor if they wish to become pregnant or think they might be pregnant. There is a potential for serious side effects to an unborn child. Talk to your health care professional or pharmacist for more information. Do not breast-feed an infant while taking this medicine or for 4 months after the last dose.  What side effects may I notice from receiving this medicine?  Side effects that you should report to your doctor or health care professional as soon as possible:  -allergic reactions like skin rash, itching or hives, swelling of the face, lips, or tongue  -bloody or black, tarry  -breathing problems  -changes in vision  -chest pain  -chills  -confusion  -constipation  -cough  -diarrhea  -dizziness or feeling faint or lightheaded  -  fast or irregular heartbeat  -fever  -flushing  -hair loss  -joint pain  -low blood counts - this medicine may decrease the number of white blood cells, red blood cells and platelets. You may be at increased risk for infections and bleeding.  -muscle pain  -muscle weakness  -persistent headache  -redness, blistering, peeling or loosening of the skin, including inside the mouth  -signs and symptoms of high blood sugar  such as dizziness; dry mouth; dry skin; fruity breath; nausea; stomach pain; increased hunger or thirst; increased urination  -signs and symptoms of kidney injury like trouble passing urine or change in the amount of urine  -signs and symptoms of liver injury like dark urine, light-colored stools, loss of appetite, nausea, right upper belly pain, yellowing of the eyes or skin  -sweating  -swollen lymph nodes  -weight loss  Side effects that usually do not require medical attention (report to your doctor or health care professional if they continue or are bothersome):  -decreased appetite  -muscle pain  -tiredness  This list may not describe all possible side effects. Call your doctor for medical advice about side effects. You may report side effects to FDA at 1-800-FDA-1088.  Where should I keep my medicine?  This drug is given in a hospital or clinic and will not be stored at home.  NOTE: This sheet is a summary. It may not cover all possible information. If you have questions about this medicine, talk to your doctor, pharmacist, or health care provider.   2019 [employer] (2018-02-28 15:06:10)

## 2018-12-11 ENCOUNTER — Telehealth: Payer: Self-pay

## 2018-12-11 ENCOUNTER — Telehealth: Payer: Self-pay | Admitting: *Deleted

## 2018-12-11 NOTE — Telephone Encounter (Signed)
  Please call patient.  We have discussed her knee pain in the past.   Knee films show moderate degenerative joint disease. No acute abnormality seen in the right knee.  She needs to follow-up with her PCP or orthopedic physician. 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 She is responding well to treatment.  M

## 2018-12-11 NOTE — Telephone Encounter (Signed)
Patients daughter, [name], left vm on triage line asking for a call to discuss knee pain.

## 2018-12-11 NOTE — Telephone Encounter (Signed)
Contacted daughter per request regarding bilateral knee pain. Daughter reports she has contacted PCP regarding pain and was informed a referral would need to be placed to orthopedic specialist. Daughter reports she has not gotten through to PCP office since to find out next steps.   I contacted PCP office and informed them of daughter's concerns. Nurse to send PCP nurse a message regarding symptoms.   Informed daughter of outcome.

## 2018-12-16 ENCOUNTER — Other Ambulatory Visit: Payer: Self-pay

## 2018-12-16 NOTE — Progress Notes (Signed)
[facility]  [address], [address] [place], [place] [address] Phone: [phone]  Fax: [phone]   Clinic Day:  12/17/2018  Referring physician: Dionne Bucy., MD  Chief Complaint: Toni Griffith is a 78 y.o. 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  HPI: The patient was last seen in the medical oncology clinic on 11/22/2018 by Faythe Casa, NP.  At that time, she was doing well. She denies any B symptoms. She denied any respiratory symptoms. She had some mild urinary frequency at night.  Exam was unremarkable. She received cycle #8 pembrolizumab.  The patient's daughter contacted the clinic about bilateral knee pain on 12/11/2018.  Previous x-rays showed degenerative joint disease. She will follow up with PCP on 12/19/2018 and will get an orthopedics referral.   During the interim, she reports she is doing well. She denies any fevers or sweats. She denies any vomiting or diarrhea.  She had one episode of nausea last week that she attributes to eating something bad.  Her biggest complaint is bilateral knee pain.   She bruises easily.   Weight is down 2lbs in the clinic today.  She is eating well.    Past Medical History:  Diagnosis Date   Arthritis    Cancer ([facility])    lymphoma-right hip   Deaf    GERD (gastroesophageal reflux disease)    Hypertension    NOT ON MED AT PRESENT    Past Surgical History:  Procedure Laterality Date   CARDIAC SURGERY  1993   CORONARY ARTERY BYPASS GRAFT     DOUBLE   CYST REMOVAL HAND Right    ENDOBRONCHIAL ULTRASOUND N/A 05/30/2018   Procedure: ENDOBRONCHIAL ULTRASOUND;  Surgeon: Tyler Pita, MD;  Location: [facility] ORS;  Service: Cardiopulmonary;  Laterality: N/A;   PERIPHERAL VASCULAR CATHETERIZATION N/A 04/06/2016   Procedure: Glori Luis Cath Insertion;  Surgeon: Algernon Huxley, MD;  Location: [facility];   Service: Cardiovascular;  Laterality: N/A;    Family History  Problem Relation Age of Onset   Lung cancer Mother    Lung cancer Father    Diabetes Maternal Uncle    Myasthenia gravis Maternal Grandmother    Leukemia Maternal Grandfather     Social History:  reports that she quit smoking about 3 years ago. Her smoking use included cigarettes. She has a 80.00 pack-year smoking history. She has never used smokeless tobacco. She reports current alcohol use. She reports that she does not use drugs. She smoked 1 1/2 packs per day for 20 years (30 pack year smoking history) until 10/2015. She is hearing impaired (deaf).  She lives in [place] with her boyfriend. Her daughter lives in [place]. Her daughter, Toni Griffith's contact number is [phone]. She is living with her son. The patient is accompanied by an [place] interpreter today, Carlene. Her daughter and son are outside on the phone secondary to the COVID-19 pandemic.  Allergies: No Known Allergies  Current Medications: Current Outpatient Medications  Medication Sig Dispense Refill   acetaminophen (TYLENOL) 500 MG tablet Take 500 mg by mouth every 6 (six) hours as needed for moderate pain.      allopurinol (ZYLOPRIM) 300 MG tablet Take 1 tablet (300 mg total) by mouth daily. 90 tablet 0   aspirin EC 81 MG tablet Take 81 mg by mouth daily.     atorvastatin (LIPITOR) 80 MG tablet Take 80  mg by mouth every evening.      b complex vitamins tablet Take 1 tablet by mouth daily.     Cholecalciferol (VITAMIN D3) 2000 units capsule Take 2,000 Units by mouth daily.     diclofenac sodium (VOLTAREN) 1 % GEL APPLY 2 G TOPICALLY 3 (THREE) TIMES DAILY AS NEEDED (LEFT KNEE). 100 g 1   ferrous sulfate 325 (65 FE) MG tablet Take 650 mg by mouth daily with breakfast.     FLUoxetine (PROZAC) 40 MG capsule Take 40 mg by mouth daily.      gabapentin (NEURONTIN) 100 MG capsule Take 2 capsules (200 mg total) by mouth at bedtime. 60 capsule 3    lidocaine-prilocaine (EMLA) cream Apply cream 1 hour before chemotherapy treatment and place small peive of saran wrap over cream to protect clothing 30 g 3   Magnesium 250 MG TABS Take 1 tablet by mouth daily.     ondansetron (ZOFRAN ODT) 4 MG disintegrating tablet Take 1 tablet (4 mg total) by mouth every 8 (eight) hours as needed for nausea or vomiting. 30 tablet 1   pantoprazole (PROTONIX) 40 MG tablet Take 40 mg by mouth daily.      vitamin B-12 (CYANOCOBALAMIN) 500 MCG tablet Take 500 mcg by mouth daily.     vitamin E 400 UNIT capsule Take 400 Units by mouth daily.      HYDROcodone-acetaminophen (NORCO) 5-325 MG tablet Take 1 tablet by mouth every 6 (six) hours as needed for moderate pain. (Patient not taking: Reported on 12/17/2018) 30 tablet 0   No current facility-administered medications for this visit.    Facility-Administered Medications Ordered in Other Visits  Medication Dose Route Frequency Provider Last Rate Last Dose   heparin lock flush 100 unit/mL  500 Units Intravenous Once Mikias Lanz C, MD       heparin lock flush 100 unit/mL  500 Units Intravenous Once Quron Ruddy C, MD       sodium chloride flush (NS) 0.9 % injection 10 mL  10 mL Intravenous PRN Hillery Zachman C, MD   10 mL at 04/05/17 1034   sodium chloride flush (NS) 0.9 % injection 10 mL  10 mL Intravenous PRN Lequita Asal, MD   10 mL at 12/17/18 0370    Review of Systems  Constitutional: Positive for malaise/fatigue and weight loss (2lbs). Negative for chills and fever.  HENT: Positive for hearing loss (deaf). Negative for congestion, ear pain, nosebleeds, sinus pain, sore throat and tinnitus.   Eyes: Negative.  Negative for blurred vision, double vision, photophobia and pain.  Respiratory: Negative.  Negative for cough, sputum production, shortness of breath and wheezing.   Cardiovascular: Negative.  Negative for chest pain, palpitations and leg swelling.  Gastrointestinal: Positive  for nausea (resolved). Negative for abdominal pain, constipation, diarrhea and vomiting.       Eating well.   Genitourinary: Negative.  Negative for dysuria, frequency, hematuria and urgency.  Musculoskeletal: Positive for joint pain (arthritic, knees) and myalgias. Negative for back pain and falls.  Skin: Negative.  Negative for rash.  Neurological: Positive for weakness. Negative for dizziness, tingling, sensory change, focal weakness and headaches.  Endo/Heme/Allergies: Bruises/bleeds easily (bruises easily).  Psychiatric/Behavioral: Negative.  Negative for depression. The patient is not nervous/anxious and does not have insomnia.   All other systems reviewed and are negative.  Performance status (ECOG):  1  Physical Exam  Constitutional: She is oriented to person, place, and time. She appears well-developed and well-nourished. No distress.  Well appearing woman sitting comfortably in a wheelchair in no acute distress.   HENT:  Head: Normocephalic and atraumatic.  Mouth/Throat: Oropharynx is clear and moist.  Short gray hair.  Dentures (loose). Wearing mask.  Eyes: Pupils are equal, round, and reactive to light. Conjunctivae and EOM are normal. No scleral icterus.  Hazel eyes.   Neck: Normal range of motion. Neck supple.  Cardiovascular: Normal rate, regular rhythm and normal heart sounds.  No murmur heard. Pulmonary/Chest: Effort normal and breath sounds normal. No respiratory distress. She has no wheezes.  Abdominal: Soft. Bowel sounds are normal. She exhibits no distension. There is no abdominal tenderness.  Musculoskeletal: Normal range of motion.        General: Edema (chronic) present.  Lymphadenopathy:    She has no cervical adenopathy.    She has no axillary adenopathy.       Right: No supraclavicular adenopathy present.       Left: No supraclavicular adenopathy present.  Neurological: She is alert and oriented to person, place, and time.  Skin: Skin is warm and dry. She  is not diaphoretic. No erythema.  Psychiatric: She has a normal mood and affect. Her behavior is normal. Judgment and thought content normal.  Nursing note and vitals reviewed.   Admissions: 02/20/2016- 02/26/2016:  [facility]:  She had renal failure and hypercalcemia. She was treated with IVF, calcitonin and Zometa.The patient has a history of coronary artery disease s/p CABG in 1993.  She has no history of heart failure.  01/20/2018 - 01/23/2018:  [facility].  She presented with nausea, vomiting, diarrhea and generalized weakness. Work-up showed hypokalemia, hypomagnesemia, dehydration and UTI.  She was treated with IV fluids and empiric IV antibiotics. Potassium and magnesium were repleted. Urine culture showed E. coli.  She was discharged on Augmentin. She was switched from HCTZ to chorthalidone. 02/01/2018 - 02/03/2018:  [facility] with hyponatremia.   Radiology studies: 02/20/2016:  Chest, abdomen and pelvic CT revealed a 3.7 cm subpleural mass within the left lower lobe, a 1.1 cm right lower lobe subpleural nodule , 9 mm RUL nodule, and 0.4 cm lingular nodule.  Between the left second, third, and fourth ribs there was 1.9 cm soft tissue mass. There was a 2.5 cm low-attenuation nodule in the left lobe of the thyroid.   There were prominent left axillary nodes (up to 1.0 cm).   There was no definite mediastinal adenopathy. There was a 5.5 x 4.6 cm cystic lesion in the left hepatic lobe.  There was a 3.1 cm nodule in the right adrenal gland. There was a 1.2 cm right retrocaval lymph node with additional prominent subcentimeter retroperitoneal nodes. There was 1.0 cm left common iliac node. There was a 6.2 x 5.2 cm soft tissue mass within the right ilium.  02/21/2016:  Head MRI revealed no evidence of malignancy.  02/21/2016:  Thyroid ultrasound revealed a 2.4 cm solid nodule in the inferior aspect of the left lobe 03/03/2016:  PET scan revealed  a hypermetabolic left lower lobe mass, bilateral pleural and pulmonary parenchymal nodular metastasis, a large pleural lesion invading the anterior left chest wall.   There was activity in the superior left ocular orbit.  There was a large lesion centered in the right iliac wing with soft tissue extension.  There were hypermetabolic right external iliac lymph nodes 06/06/2016:  PET scan revealed significant interval response to therapy.  There was significant decrease in size and FDG  uptake associated with left lower lobe lung lesion.  There was resolution of left-sided pleural base metastasis and left axillary and anterior mediastinal nodal metastasis.  There was resolution of previous hypermetabolic soft tissue metastasis/nodal metastasis posterior to the IVC.  There was marked improvement in hypermetabolic metastasis involving the right iliac wing.  There was persistent right upper lobe lung nodule which exhibits intense radiotracer uptake which may represent a focus of metastatic disease or synchronous primary lung neoplasm. 06/01/2017:  PET scan revealed no new or progressive findings.  The 9 mm RUL pulmonary nodule remained hypermetabolic (SUV 56.4, previously 7.3) and no change in size.  The index lesion in the posterior left lower lobe was minimally smaller on CT imaging with very low level FDG uptake (SUV 1.6).  There was no change in the appearance at site of previous disease at the right iliac wing (SUV 1.9 compared to 2.6 previously). 01/29/2017:  PET scan revealed interval progression of hypermetabolic nodules in the right upper (13 mm compared to 9 mm; SUV 10) and left lower lobes (26 mm compared to 22 mm; SUV 7.5).  There was new hypermetabolic focus of activity along the anterior left pleura although no underlying pleural or lung mass was evident. 09/18/2017:  PET scan revealed a mixed appearance, with marked improvement in the prior 2 pulmonary nodules; but with new Deauville 5 left common iliac  lymph node enlargement; new Deauville 5 right paraspinal lesion; and new Deauville 4 left infrahilar nodal activity.  There was a focus of accentuated metabolic activity which had been present along the right lower submandibular gland but currently seemed to project over the subcutaneous tissues without CT correlate. Current maximum SUV 10.5, previously 9.7.  There was increased accentuated activity along the left lateral spurring at L2-3. While typically such activity is inflammatory due to spurring, this had increased significantly since the prior exam and merit surveillance. There was a new 8 by 5 mm right upper lobe pulmonary nodule, Deauville 2, which also merits surveillance. 01/29/2018:  PET scan revealed persistent and progressive areas of hypermetabolism within the right submandibular gland, lingula of the left lung, left common iliac nodal chain, and right lumbar paraspinal soft tissues. Findings were compatible with Deauville criteria 4 and 5.  There was a new nodule is identified within the lingula which demonstrated intense uptake compatible with Deauville criteria 5.  05/14/2018:  Chest, abdomen, and pelvic CT revealed enlarging mediastinal adenopathy and right upper lobe nodule, indicative of disease progression. Necrotic lingular nodules were grossly stable in size with developing surrounding collapse/consolidation, possibly treatment related.  There was slight decrease in size of a left external iliac lymph node and right lumbar paraspinal soft tissue lesion.  There was a tiny left pleural effusion. 06/24/2018:  Head MRI revealed no evidence of intracranial metastasis. 09/03/2018:  Chest, abdomen, and pelvic CT revealed interval decrease in size of previous index right paratracheal and left hilar lymph nodes. Left-sided pre-vascular lymph nodes have also decreased in size in the interval. There was a high left paratracheal lymph node however which has increased in size (0.6 cm to 1.1 cm).  There  was progressive masslike architectural distortion within the perihilar left lung and lingula. There were surrounding areas of fibrosis and ground-glass attenuation.  These findings may reflect progressive post treatment changes. In the absence of interval treatment progressive neoplasm can not be excluded.  There was no residual or recurrent adenopathy within the abdomen or pelvis.  The rright paraspinal soft tissue nodule was not  confidently visualized.  She has a hiatal hernia and aortic atherosclerosis.    Infusion on 12/17/2018  Component Date Value Ref Range Status   Uric Acid, Serum 12/17/2018 3.6  2.5 - 7.1 mg/dL Final   Performed at [facility], [address], [place], [place] [address]   LDH 12/17/2018 124  98 - 192 U/L Final   Performed at [facility], [address], [place], [place] [address]   Sodium 12/17/2018 133* 135 - 145 mmol/L Final   Potassium 12/17/2018 3.4* 3.5 - 5.1 mmol/L Final   Chloride 12/17/2018 102  98 - 111 mmol/L Final   CO2 12/17/2018 21* 22 - 32 mmol/L Final   Glucose, Bld 12/17/2018 180* 70 - 99 mg/dL Final   BUN 12/17/2018 27* 8 - 23 mg/dL Final   Creatinine, Ser 12/17/2018 1.03* 0.44 - 1.00 mg/dL Final   Calcium 12/17/2018 9.0  8.9 - 10.3 mg/dL Final   Total Protein 12/17/2018 7.2  6.5 - 8.1 g/dL Final   Albumin 12/17/2018 3.4* 3.5 - 5.0 g/dL Final   AST 12/17/2018 29  15 - 41 U/L Final   ALT 12/17/2018 19  0 - 44 U/L Final   Alkaline Phosphatase 12/17/2018 72  38 - 126 U/L Final   Total Bilirubin 12/17/2018 1.1  0.3 - 1.2 mg/dL Final   GFR calc non Af Amer 12/17/2018 53* >60 mL/min Final   GFR calc Af Amer 12/17/2018 >60  >60 mL/min Final   Anion gap 12/17/2018 10  5 - 15 Final   Performed at [facility], [address] [place], [place] [address]   WBC 12/17/2018 8.9  4.0 - 10.5 K/uL Final   RBC 12/17/2018 4.01  3.87 - 5.11 MIL/uL Final   Hemoglobin 12/17/2018 13.3  12.0 - 15.0 g/dL  Final   HCT 12/17/2018 38.8  36.0 - 46.0 % Final   MCV 12/17/2018 96.8  80.0 - 100.0 fL Final   MCH 12/17/2018 33.2  26.0 - 34.0 pg Final   MCHC 12/17/2018 34.3  30.0 - 36.0 g/dL Final   RDW 12/17/2018 15.6* 11.5 - 15.5 % Final   Platelets 12/17/2018 303  150 - 400 K/uL Final   nRBC 12/17/2018 0.0  0.0 - 0.2 % Final   Neutrophils Relative % 12/17/2018 74  % Final   Neutro Abs 12/17/2018 6.6  1.7 - 7.7 K/uL Final   Lymphocytes Relative 12/17/2018 13  % Final   Lymphs Abs 12/17/2018 1.1  0.7 - 4.0 K/uL Final   Monocytes Relative 12/17/2018 10  % Final   Monocytes Absolute 12/17/2018 0.9  0.1 - 1.0 K/uL Final   Eosinophils Relative 12/17/2018 2  % Final   Eosinophils Absolute 12/17/2018 0.2  0.0 - 0.5 K/uL Final   Basophils Relative 12/17/2018 1  % Final   Basophils Absolute 12/17/2018 0.1  0.0 - 0.1 K/uL Final   Immature Granulocytes 12/17/2018 0  % Final   Abs Immature Granulocytes 12/17/2018 0.04  0.00 - 0.07 K/uL Final   Performed at [facility], [address], [place], [place] [address]    Assessment:  Katelynn Heidler is a 78 y.o. female with stage IVB B-cell lymphoma, unclassifiable, with features intermediate between diffuse large B-cell lymphoma and classical Hodgkin's lymphoma ("gray zone" lymphoma).  She underwent right iliac wing bone/bone marrow biopsy on 03/08/2016.  PET scan on 03/03/2016 revealed a hypermetabolic left lower lobe mass, bilateral pleural and pulmonary parenchymal nodular metastasis, a large pleural  lesion invading the anterior left chest wall.   There was activity in the superior left ocular orbit.  There was a large lesion centered in the right iliac wing with soft tissue extension.  There were hypermetabolic right external iliac lymph nodes  Echo on 04/04/2016 revealed an EF of 55-60%.  Hepatitis B and C testing was negative on 03/30/2016.   G6PD assay was normal.  She declined LP with MTX prophylaxis.  She received 6  cycles of mini-RCHOP (04/07/2016 - 07/21/2016).  She has had some transient fingertip numbness.  She received radiationto the right iliac wing, from 10/11/2016 - 11/01/2016. She received 3000 cGy over 3 weeks.  PET scan on 01/29/2017 revealed interval progression of hypermetabolic nodules in the right upper (13 mm compared to 9 mm; SUV 10) and left lower lobes (26 mm compared to 22 mm; SUV 7.5).  There was new hypermetabolic focus of activity along the anterior left pleura although no underlying pleural or lung mass was evident.  She has squamous cell lung cancer.  Foundation One on the lung biopsy on 03/15/2018 revealed MS-stable, tumor mutational burden 5 Muts/Mb, AKT2 amplification, CCNE1 amplification, CREBBP R1446L, KDR amplification, KIT amplification, PDGFRA amplification, PTEN loss exons 2-4, and YK99 splice site 833+8S>N. Marland Kitchen  There were no alterations in EGFR, KRAS, RET, ALK, MET, ERBB2, BRAF, ROS1.  PDL-1 IHC analysis revealed a TPS score of 50%.  CT guided left lower lobe nodule biopsy on 02/19/2017 confirmed squamous cell carcinoma.  She underwent SBRT 03/26/2017 - 04/09/2017.  She received 5000 cGy in 5 fractions to the left lower lobe lesion.  She underwent SBRT to a RUL lesion from 05/23/2017 - 06/18/2017.  CT guided left upper lobe nodule biopsy on 02/20/2018 revealed squamous cell lung cancer.  She received SBRT from 04/09/2018 - 04/15/2018.   She received 6000 cGy in 5 fractions.   Bronchoscopy on 05/30/2018 revealed no endobronchial lesions. The 2 cm pre-carinal lymph node was biopsied. Pathology revealed metastatic non-small cell carcinoma, favor squamous cell carcinoma.  PET scan on 09/18/2017 revealed a mixed appearance, with marked improvement in the prior 2 pulmonary nodules; but with new Deauville 5 left common iliac lymph node enlargement; new Deauville 5 right paraspinal lesion; and new Deauville 4 left infrahilar nodal activity.  There was a focus of accentuated  metabolic activity which had been present along the right lower submandibular gland but currently seemed to project over the subcutaneous tissues without CT correlate. Current maximum SUV 10.5, previously 9.7. There was increased accentuated activity along the left lateral spurring at L2-3. While typically such activity is inflammatory due to spurring, this had increased significantly since the prior exam and merit surveillance. There was a new 8 by 5 mm right upper lobe pulmonary nodule, Deauville 2.  CT guided right paraspinal lesion biopsy on 10/16/2017 was compatible with involvement by the patient's previously diagnosed lymphoma.  The case was referred to [facility] hematopathology.  The neoplastic cells were largely CD30 negative.  Repeat CD30 immunohistochemistry faintly stained > 10% of the large atypical cells. CD20 stained the scattered large atypical cells and few small cells.   She received 3 cycles of brentuximab vedotin (11/23/2017 - 01/11/2018).  PET scan on 01/29/2018 revealed persistent and progressive areas of hypermetabolism within the right submandibular gland, lingula of the left lung, left common iliac nodal chain, and right lumbar paraspinal soft tissues. Findings were compatible with Deauville criteria 4 and 5.  There was a new nodule is identified within the lingula which demonstrated intense  uptake compatible with Deauville criteria 5.   Chest, abdomen, and pelvic CT on 05/14/2018 revealed enlarging mediastinal adenopathy and right upper lobe nodule, indicative of disease progression. Necrotic lingular nodules were grossly stable in size with developing surrounding collapse/consolidation, possibly treatment related.  There was slight decrease in size of a left external iliac lymph node and right lumbar paraspinal soft tissue lesion.  There was a tiny left pleural effusion.  Head MRI on 06/24/2018 revealed no evidence of intracranial metastasis.  She is s/p 8 cycles of  pembrolizumab (06/14/2018  - 11/22/2018).  Chest, abdomen, and pelvic CT on 09/03/2018 revealed interval decrease in size of previous index right paratracheal and left hilar lymph nodes. Left-sided pre-vascular lymph nodes have also decreased in size in the interval. There was a high left paratracheal lymph node however which has increased in size (0.6 cm to 1.1 cm).  There was progressive masslike architectural distortion within the perihilar left lung and lingula. There were surrounding areas of fibrosis and ground-glass attenuation.  These findings may reflect progressive post treatment changes. In the absence of interval treatment progressive neoplasm can not be excluded.  There was no residual or recurrent adenopathy within the abdomen or pelvis.  The rright paraspinal soft tissue nodule was not confidently visualized.  She has a hiatal hernia and aortic atherosclerosis.    She has iron deficiency anemia.  Ferritin has been followed:  14 on 05/06/2018, 23 on 07/26/2018, and 59 on 11/01/2018.  She denies any bleeding.  She has a grade I neuropathy.  B12 and folate were normal on 12/06/2017.  She is deaf and requires an interpreter.  Symptomatically, she is doing well.  She denies any fevers or sweats.  She has lost 2 pounds; she is eating well without nausea, vomiting or diarrhea.  She denies any respiratory symptoms.  Exam is stable.  Plan: 1.   Labs today:  CBC with diff, CMP, LDH, uric acid.  2.   Pearline Cables zone lymphoma Clinically she continues to do well. She has no B symptoms.  Exam reveals no adenopathy or hepatosplenomegaly. She is tolerating treatment well without side effect. Begin cycle #9 pembrolizumab today. Discuss plan for restaging studies on 12/26/2018. 3.   Squamous cell lung cancer       Clinically, she is doing well.       She denies any respiratory symptoms.       Pembrolizumab today.  Restaging CT scans on 12/26/2018.           4.   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 Continue low-dose gabapentin.  Continue to monitor. 5.   Normocytic anemia Resolved. Hemoglobin 12.9, hematocrit 38.2, MCV 95, and platelets 279,000. Ferritin 59 today. Continue iron rich foods. 6.   B12 deficiency Patient remains on oral B12 500 mcg daily.  Check B12 and folate level today. 7.   Knee pain             Patient has knee pain unrelated to malignancy.             She is scheduled to see orthopedics. 8.   RTC in 3 weeks for MD assessment, labs (CBC with diff, CMP, LDH, uric acid, TSH), review of CT scans, and cycle #10 pembrolizumab.  I discussed the assessment and treatment plan with the patient.  The patient was provided an opportunity to ask questions and all were answered.  The patient agreed with the plan and demonstrated an understanding of the instructions.  The patient was advised to call back if the symptoms worsen or if the condition fails to improve as anticipated.   Lequita Asal, MD, PhD    12/17/2018, 10:12 AM  I, Molly Dorshimer, am acting as Education administrator for [employer]. Mike Gip, MD, PhD.  I, Broady Lafoy C. Mike Gip, MD, have reviewed the above documentation for accuracy and completeness, and I agree with the above.

## 2018-12-17 ENCOUNTER — Other Ambulatory Visit: Payer: Self-pay | Admitting: Hematology and Oncology

## 2018-12-17 ENCOUNTER — Other Ambulatory Visit: Payer: Self-pay

## 2018-12-17 ENCOUNTER — Inpatient Hospital Stay: Payer: Medicare HMO | Attending: Hematology and Oncology | Admitting: Hematology and Oncology

## 2018-12-17 ENCOUNTER — Encounter: Payer: Self-pay | Admitting: Hematology and Oncology

## 2018-12-17 ENCOUNTER — Inpatient Hospital Stay: Payer: Medicare HMO

## 2018-12-17 VITALS — BP 122/69 | HR 93 | Temp 97.7°F | Resp 16 | Wt 160.9 lb

## 2018-12-17 DIAGNOSIS — C3490 Malignant neoplasm of unspecified part of unspecified bronchus or lung: Secondary | ICD-10-CM

## 2018-12-17 DIAGNOSIS — G620 Drug-induced polyneuropathy: Secondary | ICD-10-CM

## 2018-12-17 DIAGNOSIS — E538 Deficiency of other specified B group vitamins: Secondary | ICD-10-CM | POA: Diagnosis not present

## 2018-12-17 DIAGNOSIS — C8518 Unspecified B-cell lymphoma, lymph nodes of multiple sites: Secondary | ICD-10-CM

## 2018-12-17 DIAGNOSIS — M25562 Pain in left knee: Secondary | ICD-10-CM

## 2018-12-17 DIAGNOSIS — C8590 Non-Hodgkin lymphoma, unspecified, unspecified site: Secondary | ICD-10-CM

## 2018-12-17 DIAGNOSIS — T451X5A Adverse effect of antineoplastic and immunosuppressive drugs, initial encounter: Secondary | ICD-10-CM

## 2018-12-17 DIAGNOSIS — Z5112 Encounter for antineoplastic immunotherapy: Secondary | ICD-10-CM

## 2018-12-17 DIAGNOSIS — M25561 Pain in right knee: Secondary | ICD-10-CM

## 2018-12-17 LAB — COMPREHENSIVE METABOLIC PANEL
ALT: 19 U/L (ref 0–44)
AST: 29 U/L (ref 15–41)
Albumin: 3.4 g/dL — ABNORMAL LOW (ref 3.5–5.0)
Alkaline Phosphatase: 72 U/L (ref 38–126)
Anion gap: 10 (ref 5–15)
BUN: 27 mg/dL — ABNORMAL HIGH (ref 8–23)
CO2: 21 mmol/L — ABNORMAL LOW (ref 22–32)
Calcium: 9 mg/dL (ref 8.9–10.3)
Chloride: 102 mmol/L (ref 98–111)
Creatinine, Ser: 1.03 mg/dL — ABNORMAL HIGH (ref 0.44–1.00)
GFR calc Af Amer: >60 mL/min (ref >60)
GFR calc non Af Amer: 53 mL/min — ABNORMAL LOW (ref >60)
Glucose, Bld: 180 mg/dL — ABNORMAL HIGH (ref 70–99)
Potassium: 3.4 mmol/L — ABNORMAL LOW (ref 3.5–5.1)
Sodium: 133 mmol/L — ABNORMAL LOW (ref 135–145)
Total Bilirubin: 1.1 mg/dL (ref 0.3–1.2)
Total Protein: 7.2 g/dL (ref 6.5–8.1)

## 2018-12-17 LAB — CBC WITH DIFFERENTIAL/PLATELET
Abs Immature Granulocytes: 0.04 10*3/uL (ref 0.00–0.07)
Basophils Absolute: 0.1 10*3/uL (ref 0.0–0.1)
Basophils Relative: 1 %
Eosinophils Absolute: 0.2 10*3/uL (ref 0.0–0.5)
Eosinophils Relative: 2 %
HCT: 38.8 % (ref 36.0–46.0)
Hemoglobin: 13.3 g/dL (ref 12.0–15.0)
Immature Granulocytes: 0 %
Lymphocytes Relative: 13 %
Lymphs Abs: 1.1 10*3/uL (ref 0.7–4.0)
MCH: 33.2 pg (ref 26.0–34.0)
MCHC: 34.3 g/dL (ref 30.0–36.0)
MCV: 96.8 fL (ref 80.0–100.0)
Monocytes Absolute: 0.9 10*3/uL (ref 0.1–1.0)
Monocytes Relative: 10 %
Neutro Abs: 6.6 10*3/uL (ref 1.7–7.7)
Neutrophils Relative %: 74 %
Platelets: 303 10*3/uL (ref 150–400)
RBC: 4.01 MIL/uL (ref 3.87–5.11)
RDW: 15.6 % — ABNORMAL HIGH (ref 11.5–15.5)
WBC: 8.9 10*3/uL (ref 4.0–10.5)
nRBC: 0 % (ref 0.0–0.2)

## 2018-12-17 LAB — LACTATE DEHYDROGENASE: LDH: 124 U/L (ref 98–192)

## 2018-12-17 LAB — FOLATE: Folate: 16.1 ng/mL (ref >5.9)

## 2018-12-17 LAB — VITAMIN B12: Vitamin B-12: 1138 pg/mL — ABNORMAL HIGH (ref 180–914)

## 2018-12-17 LAB — URIC ACID: Uric Acid, Serum: 3.6 mg/dL (ref 2.5–7.1)

## 2018-12-17 MED ORDER — SODIUM CHLORIDE 0.9% FLUSH
10.0000 mL | INTRAVENOUS | Status: DC | PRN
  Administered 2018-12-17: 09:00:00 10 mL via INTRAVENOUS
  Filled 2018-12-17: qty 10

## 2018-12-17 MED ORDER — SODIUM CHLORIDE 0.9 % IV SOLN
200.0000 mg | Freq: Once | INTRAVENOUS | Status: AC
  Administered 2018-12-17: 200 mg via INTRAVENOUS
  Filled 2018-12-17 (×2): qty 8

## 2018-12-17 MED ORDER — ONDANSETRON HCL 4 MG PO TABS
8.0000 mg | ORAL_TABLET | Freq: Once | ORAL | Status: AC
  Administered 2018-12-17: 8 mg via ORAL
  Filled 2018-12-17: qty 2

## 2018-12-17 MED ORDER — SODIUM CHLORIDE 0.9 % IV SOLN
Freq: Once | INTRAVENOUS | Status: AC
  Administered 2018-12-17: 11:00:00 via INTRAVENOUS
  Filled 2018-12-17: qty 250

## 2018-12-17 MED ORDER — HEPARIN SOD (PORK) LOCK FLUSH 100 UNIT/ML IV SOLN
500.0000 [IU] | Freq: Once | INTRAVENOUS | Status: AC
  Administered 2018-12-17: 500 [IU] via INTRAVENOUS
  Filled 2018-12-17: qty 5

## 2018-12-17 NOTE — Patient Instructions (Signed)
Pembrolizumab injection  What is this medicine?  PEMBROLIZUMAB (pem broe liz ue mab) is a monoclonal antibody. It is used to treat cervical cancer, esophageal cancer, head and neck cancer, hepatocellular cancer, Hodgkin lymphoma, kidney cancer, lymphoma, melanoma, Merkel cell carcinoma, lung cancer, stomach cancer, urothelial cancer, and cancers that have a certain genetic condition.  This medicine may be used for other purposes; ask your health care provider or pharmacist if you have questions.  COMMON BRAND NAME(S): Keytruda  What should I tell my health care provider before I take this medicine?  They need to know if you have any of these conditions:  -diabetes  -immune system problems  -inflammatory bowel disease  -liver disease  -lung or breathing disease  -lupus  -received or scheduled to receive an organ transplant or a stem-cell transplant that uses donor stem cells  -an unusual or allergic reaction to pembrolizumab, other medicines, foods, dyes, or preservatives  -pregnant or trying to get pregnant  -breast-feeding  How should I use this medicine?  This medicine is for infusion into a vein. It is given by a health care professional in a hospital or clinic setting.  A special MedGuide will be given to you before each treatment. Be sure to read this information carefully each time.  Talk to your pediatrician regarding the use of this medicine in children. While this drug may be prescribed for selected conditions, precautions do apply.  Overdosage: If you think you have taken too much of this medicine contact a poison control center or emergency room at once.  NOTE: This medicine is only for you. Do not share this medicine with others.  What if I miss a dose?  It is important not to miss your dose. Call your doctor or health care professional if you are unable to keep an appointment.  What may interact with this medicine?  Interactions have not been studied.  Give your health care provider a list of all the  medicines, herbs, non-prescription drugs, or dietary supplements you use. Also tell them if you smoke, drink alcohol, or use illegal drugs. Some items may interact with your medicine.  This list may not describe all possible interactions. Give your health care provider a list of all the medicines, herbs, non-prescription drugs, or dietary supplements you use. Also tell them if you smoke, drink alcohol, or use illegal drugs. Some items may interact with your medicine.  What should I watch for while using this medicine?  Your condition will be monitored carefully while you are receiving this medicine.  You may need blood work done while you are taking this medicine.  Do not become pregnant while taking this medicine or for 4 months after stopping it. Women should inform their doctor if they wish to become pregnant or think they might be pregnant. There is a potential for serious side effects to an unborn child. Talk to your health care professional or pharmacist for more information. Do not breast-feed an infant while taking this medicine or for 4 months after the last dose.  What side effects may I notice from receiving this medicine?  Side effects that you should report to your doctor or health care professional as soon as possible:  -allergic reactions like skin rash, itching or hives, swelling of the face, lips, or tongue  -bloody or black, tarry  -breathing problems  -changes in vision  -chest pain  -chills  -confusion  -constipation  -cough  -diarrhea  -dizziness or feeling faint or lightheaded  -  fast or irregular heartbeat  -fever  -flushing  -hair loss  -joint pain  -low blood counts - this medicine may decrease the number of white blood cells, red blood cells and platelets. You may be at increased risk for infections and bleeding.  -muscle pain  -muscle weakness  -persistent headache  -redness, blistering, peeling or loosening of the skin, including inside the mouth  -signs and symptoms of high blood sugar  such as dizziness; dry mouth; dry skin; fruity breath; nausea; stomach pain; increased hunger or thirst; increased urination  -signs and symptoms of kidney injury like trouble passing urine or change in the amount of urine  -signs and symptoms of liver injury like dark urine, light-colored stools, loss of appetite, nausea, right upper belly pain, yellowing of the eyes or skin  -sweating  -swollen lymph nodes  -weight loss  Side effects that usually do not require medical attention (report to your doctor or health care professional if they continue or are bothersome):  -decreased appetite  -muscle pain  -tiredness  This list may not describe all possible side effects. Call your doctor for medical advice about side effects. You may report side effects to FDA at 1-800-FDA-1088.  Where should I keep my medicine?  This drug is given in a hospital or clinic and will not be stored at home.  NOTE: This sheet is a summary. It may not cover all possible information. If you have questions about this medicine, talk to your doctor, pharmacist, or health care provider.   2019 [employer] (2018-02-28 15:06:10)

## 2018-12-17 NOTE — Progress Notes (Signed)
Pt here for follow up. Denies any concerns. Reports feeling down at times d/t pain in bilateral legs. States she is to see PCP on Thursday for this pain.

## 2018-12-22 DIAGNOSIS — M170 Bilateral primary osteoarthritis of knee: Secondary | ICD-10-CM | POA: Insufficient documentation

## 2018-12-26 ENCOUNTER — Other Ambulatory Visit: Payer: Self-pay

## 2018-12-26 ENCOUNTER — Ambulatory Visit
Admission: RE | Admit: 2018-12-26 | Discharge: 2018-12-26 | Disposition: A | Discharge status: Home / Self Care | Payer: Medicare HMO | Source: Ambulatory Visit | Attending: Oncology | Admitting: Oncology

## 2018-12-26 DIAGNOSIS — C8590 Non-Hodgkin lymphoma, unspecified, unspecified site: Secondary | ICD-10-CM | POA: Insufficient documentation

## 2018-12-26 DIAGNOSIS — C3490 Malignant neoplasm of unspecified part of unspecified bronchus or lung: Secondary | ICD-10-CM | POA: Insufficient documentation

## 2018-12-26 MED ORDER — IOHEXOL 300 MG/ML  SOLN
100.0000 mL | Freq: Once | INTRAMUSCULAR | Status: AC | PRN
  Administered 2018-12-26: 100 mL via INTRAVENOUS

## 2019-01-08 ENCOUNTER — Other Ambulatory Visit: Payer: Self-pay

## 2019-01-08 NOTE — Progress Notes (Signed)
[facility]  [address] [place], [place] [address] Phone: [phone]  Fax: [phone]   Clinic Day:  01/09/2019  Referring physician: Dionne Bucy., MD  Chief Complaint: Toni Griffith is a 78 y.o. 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  HPI: The patient was last seen in the medical oncology clinic on 12/17/2018. At that time, she was doing well.  She denied any fevers or sweats.  She had lost 2 pounds; she was eating well without nausea, vomiting or diarrhea.  She denied any respiratory symptoms.  Exam was stable. She received cycle #9 10pembrolizumab.  She was seen in orthopedics on 12/19/2018 by Duwaine Maxin, DO for bilateral knee pain due to osteoarthritis. She was started on Cymbalta and began a taper off Prozac.   Chest, abdomen, and pelvis CT on 12/26/2018 revealed interval increase in dense consolidation of perihilar left lung and lingula and a dense bandlike perifissural consolidation of the inferior right upper lobe, in keeping with evolving post treatment change. There was an unchanged small left pleural effusion. There was new, diffusely scattered ground-glass opacity bilaterally, with and unusual atoll like appearance and central clearing, most conspicuous in the right upper lobe. These findings were nonspecific and infectious or inflammatory, and particularly drug toxicity is a differential consideration. There was no change in enlarged mediastinal and AP window lymph nodes. There was no evidence of distant metastatic disease or lymphadenopathy in the abdomen or pelvis.  Her daughter was contacted this morning regarding the changes noted on chest CT.  She denied any fever, shortness of breath, chest pain or cough.  She was referred for COVID-19 rapid testing this morning; test was negative.   During the interim, she is doing "fine." She  continues to have bilateral knee pain, which is worsening. Her daughter notes she can "barely walk now." She denies any shortness of breath or cough.   She has been seen in the past by Dr Patsey Berthold of pulmonology medicine.   Past Medical History:  Diagnosis Date   Arthritis    Cancer ([place])    lymphoma-right hip   Deaf    GERD (gastroesophageal reflux disease)    Hypertension    NOT ON MED AT PRESENT    Past Surgical History:  Procedure Laterality Date   CARDIAC SURGERY  1993   CORONARY ARTERY BYPASS GRAFT     DOUBLE   CYST REMOVAL HAND Right    ENDOBRONCHIAL ULTRASOUND N/A 05/30/2018   Procedure: ENDOBRONCHIAL ULTRASOUND;  Surgeon: Tyler Pita, MD;  Location: [facility] ORS;  Service: Cardiopulmonary;  Laterality: N/A;   PERIPHERAL VASCULAR CATHETERIZATION N/A 04/06/2016   Procedure: Glori Luis Cath Insertion;  Surgeon: Algernon Huxley, MD;  Location: [facility];  Service: Cardiovascular;  Laterality: N/A;    Family History  Problem Relation Age of Onset   Lung cancer Mother    Lung cancer Father    Diabetes Maternal Uncle    Myasthenia gravis Maternal Grandmother    Leukemia Maternal Grandfather     Social History:  reports that she quit smoking about 3 years ago. Her smoking use included cigarettes. She has a 80.00 pack-year smoking history. She has never used smokeless tobacco. She reports current alcohol use. She reports that she does not use drugs. She smoked 1 1/2 packs per day for 20 years (30 pack year smoking history) until 10/2015. She is hearing impaired (  deaf). She lives in [place] with her boyfriend. Her daughter lives in [place]. Her daughter, Toni Griffith's contact number is [phone]. She is living with her son. The patient is accompanied by anASL interpreter today.Her daughter is on the phone secondary to the COVID-19 pandemic.  Allergies: No Known Allergies  Current Medications: Current Outpatient Medications  Medication Sig Dispense  Refill   allopurinol (ZYLOPRIM) 300 MG tablet Take 1 tablet (300 mg total) by mouth daily. 90 tablet 0   aspirin EC 81 MG tablet Take 81 mg by mouth daily.     atorvastatin (LIPITOR) 80 MG tablet Take 80 mg by mouth every evening.      b complex vitamins tablet Take 1 tablet by mouth daily.     Cholecalciferol (VITAMIN D3) 2000 units capsule Take 2,000 Units by mouth daily.     diclofenac sodium (VOLTAREN) 1 % GEL APPLY 2 G TOPICALLY 3 (THREE) TIMES DAILY AS NEEDED (LEFT KNEE). 100 g 1   DULoxetine (CYMBALTA) 20 MG capsule Take 10 mg by mouth.      ferrous sulfate 325 (65 FE) MG tablet Take 650 mg by mouth daily with breakfast.     FLUoxetine (PROZAC) 10 MG tablet Take 10 mg by mouth daily.     gabapentin (NEURONTIN) 100 MG capsule Take 2 capsules (200 mg total) by mouth at bedtime. 60 capsule 3   lidocaine-prilocaine (EMLA) cream Apply cream 1 hour before chemotherapy treatment and place small peive of saran wrap over cream to protect clothing 30 g 3   Magnesium 250 MG TABS Take 1 tablet by mouth daily.     pantoprazole (PROTONIX) 40 MG tablet Take 40 mg by mouth daily.      vitamin B-12 (CYANOCOBALAMIN) 500 MCG tablet Take 500 mcg by mouth daily.     vitamin E 400 UNIT capsule Take 400 Units by mouth daily.      acetaminophen (TYLENOL) 500 MG tablet Take 500 mg by mouth every 6 (six) hours as needed for moderate pain.      HYDROcodone-acetaminophen (NORCO) 5-325 MG tablet Take 1 tablet by mouth every 6 (six) hours as needed for moderate pain. (Patient not taking: Reported on 12/17/2018) 30 tablet 0   ondansetron (ZOFRAN ODT) 4 MG disintegrating tablet Take 1 tablet (4 mg total) by mouth every 8 (eight) hours as needed for nausea or vomiting. (Patient not taking: Reported on 01/09/2019) 30 tablet 1   No current facility-administered medications for this visit.    Facility-Administered Medications Ordered in Other Visits  Medication Dose Route Frequency Provider Last Rate Last  Dose   heparin lock flush 100 unit/mL  500 Units Intravenous Once Natavia Sublette C, MD       heparin lock flush 100 unit/mL  500 Units Intravenous Once Gelsey Amyx C, MD       sodium chloride flush (NS) 0.9 % injection 10 mL  10 mL Intravenous PRN Mike Gip, Thadd Apuzzo C, MD   10 mL at 04/05/17 1034   sodium chloride flush (NS) 0.9 % injection 10 mL  10 mL Intravenous PRN Lequita Asal, MD   10 mL at 01/09/19 1349    Review of Systems  Constitutional: Negative for chills, diaphoresis, fever, malaise/fatigue and weight loss (Up 3 lbs).       Feels "fine".  HENT: Positive for hearing loss (deaf). Negative for congestion, ear pain, nosebleeds, sinus pain, sore throat and tinnitus.   Eyes: Negative.  Negative for blurred vision, double vision, photophobia and pain.  Respiratory: Negative.  Negative for cough, hemoptysis, sputum production, shortness of breath and wheezing.   Cardiovascular: Negative.  Negative for chest pain, palpitations, orthopnea and leg swelling.  Gastrointestinal: Negative for abdominal pain, blood in stool, constipation, diarrhea, heartburn, nausea and vomiting.       Eating well.   Genitourinary: Negative.  Negative for dysuria, frequency, hematuria and urgency.  Musculoskeletal: Positive for joint pain (arthritic, knees). Negative for back pain, falls, myalgias and neck pain.  Skin: Negative.  Negative for itching and rash.  Neurological: Negative for dizziness, tingling, sensory change, focal weakness, weakness and headaches.  Endo/Heme/Allergies: Bruises/bleeds easily (bruises easily).  Psychiatric/Behavioral: Negative.  Negative for depression and memory loss. The patient is not nervous/anxious and does not have insomnia.   All other systems reviewed and are negative.  Performance status (ECOG): 2  Physical Exam  Constitutional: She is oriented to person, place, and time. She appears well-developed and well-nourished. No distress.  Well appearing  woman sitting comfortably in a wheelchair in no acute distress.   HENT:  Head: Normocephalic and atraumatic.  Mouth/Throat: Oropharynx is clear and moist.  Short gray hair.  Wearing a mask.  Dentures (loose).  Eyes: Pupils are equal, round, and reactive to light. Conjunctivae and EOM are normal. No scleral icterus.  Hazel eyes.   Neck: Normal range of motion. Neck supple. No JVD present.  Cardiovascular: Normal rate, regular rhythm and normal heart sounds.  No murmur heard. Pulmonary/Chest: Effort normal and breath sounds normal. No respiratory distress. She has no wheezes. She has no rales.  Abdominal: Soft. Bowel sounds are normal. She exhibits no distension and no mass. There is no abdominal tenderness. There is no rebound and no guarding.  Musculoskeletal: Normal range of motion.        General: Edema (chronic) present.  Lymphadenopathy:    She has no cervical adenopathy.    She has no axillary adenopathy.       Right: No supraclavicular adenopathy present.       Left: No supraclavicular adenopathy present.  Neurological: She is alert and oriented to person, place, and time.  Skin: Skin is warm and dry. No rash noted. She is not diaphoretic. No erythema. No pallor.  Psychiatric: She has a normal mood and affect. Her behavior is normal. Judgment and thought content normal.  Nursing note and vitals reviewed.   Admissions: 02/20/2016- 02/26/2016: [facility]: She had renal failure and hypercalcemia. She was treated with IVF, calcitonin and Zometa.The patient has a history of coronary artery disease s/p CABG in 1993. She has no history of heart failure.  01/20/2018 - 01/23/2018: [facility]. She presented with nausea, vomiting, diarrhea and generalized weakness. Work-up showed hypokalemia, hypomagnesemia, dehydration and UTI. She was treated with IV fluids and empiric IV antibiotics. Potassium and magnesium were repleted. Urine  culture showed E. coli. She was discharged on Augmentin. She was switched from HCTZ to chorthalidone. [facility]   Radiology studies: 02/20/2016: Chest, abdomen and pelvis CT revealed a 3.7 cm subpleural mass within the left lower lobe, a 1.1 cm right lower lobe subpleural nodule , 9 mm RUL nodule, and 0.4 cm lingular nodule. Between the left second, third, and fourth ribs there was 1.9 cm soft tissue mass. There was a 2.5 cm low-attenuation nodule in the left lobe of the thyroid. There were prominent left axillary nodes (up to 1.0 cm). There was no definite mediastinal adenopathy. There was a 5.5  x 4.6 cm cystic lesion in the left hepatic lobe. There was a 3.1 cm nodule in the right adrenal gland. There was a 1.2 cm right retrocaval lymph node with additional prominent subcentimeter retroperitoneal nodes. There was 1.0 cm left common iliac node. There was a 6.2 x 5.2 cm soft tissue mass within the right ilium.  02/21/2016:Head MRIrevealed no evidence of malignancy.  02/21/2016: Thyroid ultrasound revealed a 2.4 cm solid nodule in the inferior aspect of the left lobe 03/03/2016: PET scanrevealed a hypermetabolic left lower lobe mass, bilateral pleural and pulmonary parenchymal nodular metastasis, a large pleural lesion invading the anterior left chest wall. There was activity in the superior left ocular orbit. There was a large lesion centered in the right iliac wing with soft tissue extension. There were hypermetabolic right external iliac lymph nodes 06/06/2016:PET scanrevealed significant interval response to therapy. There was significant decrease in size and FDG uptake associated with left lower lobe lung lesion. There was resolution of left-sided pleural base metastasis and left axillary and anterior mediastinal nodal metastasis. There was resolution of previous hypermetabolic soft tissue metastasis/nodal metastasis posterior to the IVC.  There was marked improvement in hypermetabolic metastasis involving the right iliac wing. There was persistent right upper lobe lung nodule which exhibits intense radiotracer uptake which may represent a focus of metastatic disease or synchronous primary lung neoplasm. 06/01/2017:PET scanrevealed no new or progressive findings. The 9 mm RUL pulmonary nodule remained hypermetabolic (SUV 49.7, previously 7.3) and no change in size. The index lesion in the posterior left lower lobe was minimally smaller on CT imaging with very low level FDG uptake (SUV 1.6). There was no change in the appearance at site of previous disease at the right iliac wing (SUV 1.9 compared to 2.6 previously). 01/29/2017: PET scanrevealed interval progression of hypermetabolic nodules in the right upper (13 mm compared to 9 mm; SUV 10) and left lower lobes (26 mm compared to 22 mm; SUV 7.5). There was new hypermetabolic focus of activity along the anterior left pleura although no underlying pleural or lung mass was evident. 09/18/2017: PET scanrevealed a mixed appearance, with marked improvement in the prior 2 pulmonary nodules; but with new Deauville 5 left common iliac lymph node enlargement; new Deauville 5 right paraspinal lesion; and new Deauville 4 left infrahilar nodal activity. There was a focus of accentuated metabolic activity which had been present along the right lower submandibular gland but currently seemed to project over the subcutaneous tissues without CT correlate. Current maximum SUV 10.5, previously 9.7. There was increased accentuated activity along the left lateral spurring at L2-3. While typically such activity is inflammatory due to spurring, this had increased significantly since the prior exam and merit surveillance. There was a new 8 by 5 mm right upper lobe pulmonary nodule, Deauville 2, which also merits surveillance. 01/29/2018: PET scanrevealed persistent and progressive areas of  hypermetabolism within the right submandibular gland, lingula of the left lung, left common iliac nodal chain, and right lumbar paraspinal soft tissues. Findings were compatible with Deauville criteria 4 and 5. There was a new nodule is identified within the lingula which demonstrated intense uptake compatible with Deauville criteria 5.  05/14/2018: Chest, abdomen, and pelvis CTrevealed enlarging mediastinal adenopathy and right upper lobe nodule, indicative of disease progression. Necrotic lingular nodules were grossly stable in size with developing surrounding collapse/consolidation, possibly treatment related. There was slight decrease in size of a left external iliac lymph node and right lumbar paraspinal soft tissue lesion. There was a tiny  left pleural effusion. 06/24/2018: Head MRI revealed no evidence of intracranial metastasis. 09/03/2018: Chest, abdomen, and pelvis CTrevealed interval decrease in size of previous index right paratracheal and left hilar lymph nodes. Left-sided pre-vascular lymph nodes have also decreased in size in the interval. There was a high left paratracheal lymph node however which has increased in size (0.6 cm to 1.1 cm). There was progressive masslike architectural distortion within the perihilar left lung and lingula. There were surrounding areas of fibrosis and ground-glass attenuation. These findings may reflect progressive post treatment changes. In the absence of interval treatment progressive neoplasm can not be excluded. There was no residual or recurrent adenopathy within the abdomen or pelvis. The rright paraspinal soft tissue nodule was not confidently visualized. She has a hiatal hernia and aortic atherosclerosis.  12/26/2018:  Chest, abdomen, and pelvis CT revealed interval increase in dense consolidation of perihilar left lung and lingula and a dense bandlike perifissural consolidation of the inferior right upper lobe, in keeping with evolving post  treatment change. There was an unchanged small left pleural effusion. There was new, diffusely scattered ground-glass opacity bilaterally, with and unusual atoll like appearance and central clearing, most conspicuous in the right upper lobe. These findings were nonspecific and infectious or inflammatory, and particularly drug toxicity is a differential consideration. There was no change in enlarged mediastinal and AP window lymph nodes. There was no evidence of distant metastatic disease or lymphadenopathy in the abdomen or pelvis.   Admission on 01/09/2019, Discharged on 01/09/2019  Component Date Value Ref Range Status   SARS Coronavirus 2 01/09/2019 NEGATIVE  NEGATIVE Final   Comment: (NOTE) If result is NEGATIVE SARS-CoV-2 target nucleic acids are NOT DETECTED. The SARS-CoV-2 RNA is generally detectable in upper and lower  respiratory specimens during the acute phase of infection. The lowest  concentration of SARS-CoV-2 viral copies this assay can detect is 250  copies / mL. A negative result does not preclude SARS-CoV-2 infection  and should not be used as the sole basis for treatment or other  patient management decisions.  A negative result may occur with  improper specimen collection / handling, submission of specimen other  than nasopharyngeal swab, presence of viral mutation(s) within the  areas targeted by this assay, and inadequate number of viral copies  (<250 copies / mL). A negative result must be combined with clinical  observations, patient history, and epidemiological information. If result is POSITIVE SARS-CoV-2 target nucleic acids are DETECTED. The SARS-CoV-2 RNA is generally detectable in upper and lower  respiratory specimens dur                          ing the acute phase of infection.  Positive  results are indicative of active infection with SARS-CoV-2.  Clinical  correlation with patient history and other diagnostic information is  necessary to determine  patient infection status.  Positive results do  not rule out bacterial infection or co-infection with other viruses. If result is PRESUMPTIVE POSTIVE SARS-CoV-2 nucleic acids MAY BE PRESENT.   A presumptive positive result was obtained on the submitted specimen  and confirmed on repeat testing.  While 2019 novel coronavirus  (SARS-CoV-2) nucleic acids may be present in the submitted sample  additional confirmatory testing may be necessary for epidemiological  and / or clinical management purposes  to differentiate between  SARS-CoV-2 and other Sarbecovirus currently known to infect humans.  If clinically indicated additional testing with an alternate test  methodology (  [id number]) is advised. The SARS-CoV-2 RNA is generally  detectable in upper and lower respiratory sp                          ecimens during the acute  phase of infection. The expected result is Negative. Fact Sheet for Patients:  StrictlyIdeas.no Fact Sheet for Healthcare Providers: BankingDealers.co.za This test is not yet approved or cleared by the [place] FDA and has been authorized for detection and/or diagnosis of SARS-CoV-2 by FDA under an Emergency Use Authorization (EUA).  This EUA will remain in effect (meaning this test can be used) for the duration of the COVID-19 declaration under Section 564(b)(1) of the Act, 21 U.S.C. section 360bbb-3(b)(1), unless the authorization is terminated or revoked sooner. Performed at [facility], [address] [place], [place] [address]   Infusion on 01/09/2019  Component Date Value Ref Range Status   Uric Acid, Serum 01/09/2019 3.6  2.5 - 7.1 mg/dL Final   Performed at [facility], [address], [place], [place] [address]   LDH 01/09/2019 113  98 - 192 U/L Final   Performed at [facility], [address], [place], [place] [address]   Sodium 01/09/2019 133* 135 - 145 mmol/L Final     Potassium 01/09/2019 3.4* 3.5 - 5.1 mmol/L Final   Chloride 01/09/2019 104  98 - 111 mmol/L Final   CO2 01/09/2019 22  22 - 32 mmol/L Final   Glucose, Bld 01/09/2019 126* 70 - 99 mg/dL Final   BUN 01/09/2019 22  8 - 23 mg/dL Final   Creatinine, Ser 01/09/2019 0.90  0.44 - 1.00 mg/dL Final   Calcium 01/09/2019 8.9  8.9 - 10.3 mg/dL Final   Total Protein 01/09/2019 7.0  6.5 - 8.1 g/dL Final   Albumin 01/09/2019 3.1* 3.5 - 5.0 g/dL Final   AST 01/09/2019 23  15 - 41 U/L Final   ALT 01/09/2019 18  0 - 44 U/L Final   Alkaline Phosphatase 01/09/2019 84  38 - 126 U/L Final   Total Bilirubin 01/09/2019 0.9  0.3 - 1.2 mg/dL Final   GFR calc non Af Amer 01/09/2019 >60  >60 mL/min Final   GFR calc Af Amer 01/09/2019 >60  >60 mL/min Final   Anion gap 01/09/2019 7  5 - 15 Final   Performed at [facility], [address], [place], [place] [address]   WBC 01/09/2019 8.2  4.0 - 10.5 K/uL Final   RBC 01/09/2019 3.87  3.87 - 5.11 MIL/uL Final   Hemoglobin 01/09/2019 12.8  12.0 - 15.0 g/dL Final   HCT 01/09/2019 37.3  36.0 - 46.0 % Final   MCV 01/09/2019 96.4  80.0 - 100.0 fL Final   MCH 01/09/2019 33.1  26.0 - 34.0 pg Final   MCHC 01/09/2019 34.3  30.0 - 36.0 g/dL Final   RDW 01/09/2019 15.3  11.5 - 15.5 % Final   Platelets 01/09/2019 297  150 - 400 K/uL Final   nRBC 01/09/2019 0.0  0.0 - 0.2 % Final   Neutrophils Relative % 01/09/2019 64  % Final   Neutro Abs 01/09/2019 5.3  1.7 - 7.7 K/uL Final   Lymphocytes Relative 01/09/2019 16  % Final   Lymphs Abs 01/09/2019 1.3  0.7 - 4.0 K/uL Final   Monocytes Relative 01/09/2019 16  % Final   Monocytes Absolute 01/09/2019 1.3* 0.1 - 1.0 K/uL Final   Eosinophils Relative 01/09/2019 2  %  Final   Eosinophils Absolute 01/09/2019 0.2  0.0 - 0.5 K/uL Final   Basophils Relative 01/09/2019 1  % Final   Basophils Absolute 01/09/2019 0.1  0.0 - 0.1 K/uL Final   Immature Granulocytes 01/09/2019 1  % Final    Abs Immature Granulocytes 01/09/2019 0.04  0.00 - 0.07 K/uL Final   Performed at [facility], [address], [place], [place] [address]    Assessment:  Katryna Tschirhart is a 78 y.o. female with stage IVB B-cell lymphoma, unclassifiable, with features intermediate between diffuse large B-cell lymphoma and classical Hodgkin's lymphoma ("gray zone" lymphoma). She underwent right iliac wing bone/bone marrow biopsy on 03/08/2016.  PET scanon 03/03/2016 revealed a hypermetabolic left lower lobe mass, bilateral pleural and pulmonary parenchymal nodular metastasis, a large pleural lesion invading the anterior left chest wall. There was activity in the superior left ocular orbit. There was a large lesion centered in the right iliac wing with soft tissue extension. There were hypermetabolic right external iliac lymph nodes  [place] 04/04/2016 revealed an EF of 55-60%. Hepatitis B and C testing was negative on 03/30/2016. G6PD assay was normal. She declined LP with MTX prophylaxis.  She received 6 cycles of mini-RCHOP(04/07/2016 - 07/21/2016). She has had some transient fingertip numbness.  She received radiationto the right iliac wing, from 10/11/2016 - 11/01/2016. She received 3000 cGy over 3 weeks.  PET scanon 01/29/2017 revealed interval progression of hypermetabolic nodules in the right upper (13 mm compared to 9 mm; SUV 10) and left lower lobes (26 mm compared to 22 mm; SUV 7.5). There was new hypermetabolic focus of activity along the anterior left pleura although no underlying pleural or lung mass was evident.  She has squamous cell lung cancer. Foundation Oneon the lung biopsy on 03/15/2018 revealed MS-stable, tumor mutational burden 5 Muts/Mb, AKT2 amplification, CCNE1 amplification, CREBBP [id number], KDR amplification, KIT amplification, PDGFRA amplification, PTEN loss exons 2-4, and ZD63 splice site 875+6E>P. Marland Kitchen There were no alterations in EGFR, KRAS, RET,  ALK, MET, ERBB2, BRAF, ROS1. PDL-1IHC analysis revealed a TPS score of 50%.  CT guided left lower lobe nodule biopsyon 02/19/2017 confirmed squamous cell carcinoma. She underwent SBRT08/27/2018 - 04/09/2017. She received 5000 cGy in 5 fractions to the left lower lobe lesion. She underwent SBRTto a RUL lesion from 05/23/2017 - 06/18/2017.  CT guided left upper lobe nodule biopsyon 02/20/2018 revealedsquamous cell lung cancer. She received SBRTfrom 04/09/2018 - 04/15/2018. She received 6000 cGy in 5 fractions.   Bronchoscopyon 05/30/2018 revealed no endobronchial lesions. The 2 cm pre-carinal lymph node was biopsied. Pathology revealed metastatic non-small cell carcinoma, favor squamous cell carcinoma.  PET scanon 09/18/2017 revealed a mixed appearance, with marked improvement in the prior 2 pulmonary nodules; but with new Deauville 5 left common iliac lymph node enlargement; new Deauville 5 right paraspinal lesion; and new Deauville 4 left infrahilar nodal activity. There was a focus of accentuated metabolic activity which had been present along the right lower submandibular gland but currently seemed to project over the subcutaneous tissues without CT correlate. Current maximum SUV 10.5, previously 9.7. There was increased accentuated activity along the left lateral spurring at L2-3. While typically such activity is inflammatory due to spurring, this had increased significantly since the prior exam and merit surveillance. There was a new 8 by 5 mm right upper lobe pulmonary nodule, Deauville 2.  CT guided right paraspinal lesion biopsyon 10/16/2017 was compatible with involvement by the patient's previously diagnosed lymphoma. The case was referred to [facility]  hematopathology. The neoplastic cells were largely CD30 negative. Repeat CD30 immunohistochemistry faintly stained >10% of the large atypical cells. CD20 stained the scattered large atypical cells and few small cells.   She  received 3 cycles of brentuximab vedotin(11/23/2017 - 01/11/2018).  PET scan on 01/29/2018 revealed persistent and progressive areas of hypermetabolism within the right submandibular gland, lingula of the left lung, left common iliac nodal chain, and right lumbar paraspinal soft tissues. Findings were compatible with Deauville criteria 4 and 5. There was a new nodule is identified within the lingula which demonstrated intense uptake compatible with Deauville criteria 5.   Chest, abdomen, and pelvis CTon 05/14/2018 revealed enlarging mediastinal adenopathy and right upper lobe nodule, indicative of disease progression. Necrotic lingular nodules were grossly stable in size with developing surrounding collapse/consolidation, possibly treatment related. There was slight decrease in size of a left external iliac lymph node and right lumbar paraspinal soft tissue lesion. There was a tiny left pleural effusion.  Head MRIon 06/24/2018 revealed no evidence of intracranial metastasis.  She is s/p8cycles of pembrolizumab(06/14/2018 - 11/22/2018).  Chest, abdomen, and pelvis CT on 12/26/2018 revealed interval increase in dense consolidation of perihilar left lung and lingula and a dense bandlike perifissural consolidation of the inferior right upper lobe, in keeping with evolving post treatment change. There was an unchanged small left pleural effusion. There was new, diffusely scattered ground-glass opacity bilaterally, with and unusual atoll like appearance and central clearing, most conspicuous in the right upper lobe. These findings were nonspecific and infectious or inflammatory, and particularly drug toxicity is a differential consideration. There was no change in enlarged mediastinal and AP window lymph nodes. There was no evidence of distant metastatic disease or lymphadenopathy in the abdomen or pelvis.  COVID-19 testing was negative on 01/09/2019.  She hasiron deficiency anemia.  Ferritinhas been followed: 14 on 05/06/2018,23 on 07/26/2018, and 59 on 11/01/2018. She denies any bleeding.  She has a grade I neuropathy. B12 and folate were normal on 12/06/2017. She is deafand requires an interpreter.  Symptomatically, she feels fine.  She denies any respiratory symptoms or fever.  Exam is stable.  Plan: 1.   Labs today: CBC with diff, CMP, LDH, uric acid, TSH. 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 She denies any B symptoms.  Exam reveals no adenopathy or hepatosplenomegaly. CT scans personally reviewed and showed to patient and her daughter.  Agree with radiology interpretation.  No evidence of progressive disease. She appears to be her treatment well without side effect.  Unclear if new diffuse scattered ground-glass opacities are related to pembrolizumb.  Clinically no shortness of breath or cough. Discuss postponing cycle #10pembrolizumab today. 3. Squamous cell lung cancer Clinically, she is doing well. She denies any respiratory symptoms. Restaging scans reveal no evidence of progressive lung cancer.  As above, discuss holding pembrolizumab. 4. Ground glass opacities  Etiology unclear (infectious, inflammatory, pembrolizumab related).  COVID-19 testing today is negative.  Patient denies any respiratory symptoms.  Films reviewed with Dr Patsey Berthold.  Rx: Solumedrol Dose pak.  Patient to be seen in follow-up with Dr Patsey Berthold [facility], nurse navigator contacted). 5.   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 Continue to monitor. 6. B12 deficiency Patientremains on oral B12 500 mcg daily. B12 and folate were normal on 12/17/2018. 7. Knee pain Patient haskneepain unrelated to malignancy. Orthopedics appointment on 01/11/2019. 8.Patient to call for return appointment after cleared by Dr Patsey Berthold for MD assessment, labs (CBC with diff,  CMP,  Mg, TSH), and +/- cycle #10 pembrolizumab.  I discussed the assessment and treatment plan with the patient.  The patient was provided an opportunity to ask questions and all were answered.  The patient agreed with the plan and demonstrated an understanding of the instructions.  The patient was advised to call back if the symptoms worsen or if the condition fails to improve as anticipated.  I provided 25 minutes of face-to-face time during this this encounter and > 50% was spent counseling as documented under my assessment and plan.    Lequita Asal, MD, PhD    01/09/2019, 2:29 PM  I, Molly Dorshimer, am acting as Education administrator for [employer]. Mike Gip, MD, PhD.  I, Dorleen Kissel C. Mike Gip, MD, have reviewed the above documentation for accuracy and completeness, and I agree with the above.

## 2019-01-09 ENCOUNTER — Inpatient Hospital Stay: Payer: Medicare HMO | Attending: Hematology and Oncology | Admitting: Hematology and Oncology

## 2019-01-09 ENCOUNTER — Other Ambulatory Visit: Payer: Self-pay

## 2019-01-09 ENCOUNTER — Inpatient Hospital Stay: Payer: Medicare HMO

## 2019-01-09 ENCOUNTER — Encounter: Payer: Self-pay | Admitting: Hematology and Oncology

## 2019-01-09 ENCOUNTER — Encounter: Payer: Self-pay | Admitting: Emergency Medicine

## 2019-01-09 ENCOUNTER — Emergency Department
Admission: EM | Admit: 2019-01-09 | Discharge: 2019-01-09 | Disposition: A | Discharge status: Home / Self Care | Payer: Medicare HMO | Attending: Emergency Medicine | Admitting: Emergency Medicine

## 2019-01-09 VITALS — BP 135/71 | Temp 97.6°F | Resp 18 | Ht 63.0 in | Wt 163.3 lb

## 2019-01-09 DIAGNOSIS — D509 Iron deficiency anemia, unspecified: Secondary | ICD-10-CM | POA: Insufficient documentation

## 2019-01-09 DIAGNOSIS — M25562 Pain in left knee: Secondary | ICD-10-CM | POA: Insufficient documentation

## 2019-01-09 DIAGNOSIS — Z5112 Encounter for antineoplastic immunotherapy: Secondary | ICD-10-CM

## 2019-01-09 DIAGNOSIS — C8590 Non-Hodgkin lymphoma, unspecified, unspecified site: Secondary | ICD-10-CM

## 2019-01-09 DIAGNOSIS — Z791 Long term (current) use of non-steroidal anti-inflammatories (NSAID): Secondary | ICD-10-CM | POA: Diagnosis not present

## 2019-01-09 DIAGNOSIS — N183 Chronic kidney disease, stage 3 (moderate): Secondary | ICD-10-CM | POA: Insufficient documentation

## 2019-01-09 DIAGNOSIS — C3490 Malignant neoplasm of unspecified part of unspecified bronchus or lung: Secondary | ICD-10-CM | POA: Insufficient documentation

## 2019-01-09 DIAGNOSIS — Z9221 Personal history of antineoplastic chemotherapy: Secondary | ICD-10-CM | POA: Insufficient documentation

## 2019-01-09 DIAGNOSIS — Z8572 Personal history of non-Hodgkin lymphomas: Secondary | ICD-10-CM | POA: Insufficient documentation

## 2019-01-09 DIAGNOSIS — M1990 Unspecified osteoarthritis, unspecified site: Secondary | ICD-10-CM | POA: Insufficient documentation

## 2019-01-09 DIAGNOSIS — I2510 Atherosclerotic heart disease of native coronary artery without angina pectoris: Secondary | ICD-10-CM | POA: Diagnosis not present

## 2019-01-09 DIAGNOSIS — I10 Essential (primary) hypertension: Secondary | ICD-10-CM | POA: Diagnosis not present

## 2019-01-09 DIAGNOSIS — E538 Deficiency of other specified B group vitamins: Secondary | ICD-10-CM | POA: Diagnosis not present

## 2019-01-09 DIAGNOSIS — Z87891 Personal history of nicotine dependence: Secondary | ICD-10-CM | POA: Insufficient documentation

## 2019-01-09 DIAGNOSIS — Z923 Personal history of irradiation: Secondary | ICD-10-CM | POA: Diagnosis not present

## 2019-01-09 DIAGNOSIS — T451X5A Adverse effect of antineoplastic and immunosuppressive drugs, initial encounter: Secondary | ICD-10-CM

## 2019-01-09 DIAGNOSIS — Z7982 Long term (current) use of aspirin: Secondary | ICD-10-CM | POA: Insufficient documentation

## 2019-01-09 DIAGNOSIS — Z85118 Personal history of other malignant neoplasm of bronchus and lung: Secondary | ICD-10-CM | POA: Diagnosis not present

## 2019-01-09 DIAGNOSIS — Z951 Presence of aortocoronary bypass graft: Secondary | ICD-10-CM | POA: Diagnosis not present

## 2019-01-09 DIAGNOSIS — K219 Gastro-esophageal reflux disease without esophagitis: Secondary | ICD-10-CM | POA: Insufficient documentation

## 2019-01-09 DIAGNOSIS — Z79899 Other long term (current) drug therapy: Secondary | ICD-10-CM | POA: Diagnosis not present

## 2019-01-09 DIAGNOSIS — M25561 Pain in right knee: Secondary | ICD-10-CM | POA: Insufficient documentation

## 2019-01-09 DIAGNOSIS — H9190 Unspecified hearing loss, unspecified ear: Secondary | ICD-10-CM | POA: Insufficient documentation

## 2019-01-09 DIAGNOSIS — I129 Hypertensive chronic kidney disease with stage 1 through stage 4 chronic kidney disease, or unspecified chronic kidney disease: Secondary | ICD-10-CM | POA: Insufficient documentation

## 2019-01-09 DIAGNOSIS — C8518 Unspecified B-cell lymphoma, lymph nodes of multiple sites: Secondary | ICD-10-CM | POA: Diagnosis present

## 2019-01-09 DIAGNOSIS — G620 Drug-induced polyneuropathy: Secondary | ICD-10-CM | POA: Diagnosis not present

## 2019-01-09 DIAGNOSIS — Z03818 Encounter for observation for suspected exposure to other biological agents ruled out: Secondary | ICD-10-CM | POA: Diagnosis present

## 2019-01-09 DIAGNOSIS — Z0000 Encounter for general adult medical examination without abnormal findings: Secondary | ICD-10-CM

## 2019-01-09 DIAGNOSIS — R9389 Abnormal findings on diagnostic imaging of other specified body structures: Secondary | ICD-10-CM

## 2019-01-09 LAB — COMPREHENSIVE METABOLIC PANEL
ALT: 18 U/L (ref 0–44)
AST: 23 U/L (ref 15–41)
Albumin: 3.1 g/dL — ABNORMAL LOW (ref 3.5–5.0)
Alkaline Phosphatase: 84 U/L (ref 38–126)
Anion gap: 7 (ref 5–15)
BUN: 22 mg/dL (ref 8–23)
CO2: 22 mmol/L (ref 22–32)
Calcium: 8.9 mg/dL (ref 8.9–10.3)
Chloride: 104 mmol/L (ref 98–111)
Creatinine, Ser: 0.9 mg/dL (ref 0.44–1.00)
GFR calc Af Amer: >60 mL/min (ref >60)
GFR calc non Af Amer: >60 mL/min (ref >60)
Glucose, Bld: 126 mg/dL — ABNORMAL HIGH (ref 70–99)
Potassium: 3.4 mmol/L — ABNORMAL LOW (ref 3.5–5.1)
Sodium: 133 mmol/L — ABNORMAL LOW (ref 135–145)
Total Bilirubin: 0.9 mg/dL (ref 0.3–1.2)
Total Protein: 7 g/dL (ref 6.5–8.1)

## 2019-01-09 LAB — CBC WITH DIFFERENTIAL/PLATELET
Abs Immature Granulocytes: 0.04 10*3/uL (ref 0.00–0.07)
Basophils Absolute: 0.1 10*3/uL (ref 0.0–0.1)
Basophils Relative: 1 %
Eosinophils Absolute: 0.2 10*3/uL (ref 0.0–0.5)
Eosinophils Relative: 2 %
HCT: 37.3 % (ref 36.0–46.0)
Hemoglobin: 12.8 g/dL (ref 12.0–15.0)
Immature Granulocytes: 1 %
Lymphocytes Relative: 16 %
Lymphs Abs: 1.3 10*3/uL (ref 0.7–4.0)
MCH: 33.1 pg (ref 26.0–34.0)
MCHC: 34.3 g/dL (ref 30.0–36.0)
MCV: 96.4 fL (ref 80.0–100.0)
Monocytes Absolute: 1.3 10*3/uL — ABNORMAL HIGH (ref 0.1–1.0)
Monocytes Relative: 16 %
Neutro Abs: 5.3 10*3/uL (ref 1.7–7.7)
Neutrophils Relative %: 64 %
Platelets: 297 10*3/uL (ref 150–400)
RBC: 3.87 MIL/uL (ref 3.87–5.11)
RDW: 15.3 % (ref 11.5–15.5)
WBC: 8.2 10*3/uL (ref 4.0–10.5)
nRBC: 0 % (ref 0.0–0.2)

## 2019-01-09 LAB — URIC ACID: Uric Acid, Serum: 3.6 mg/dL (ref 2.5–7.1)

## 2019-01-09 LAB — TSH: TSH: 1.052 u[IU]/mL (ref 0.350–4.500)

## 2019-01-09 LAB — SARS CORONAVIRUS 2 BY RT PCR (HOSPITAL ORDER, PERFORMED IN ~~LOC~~ HOSPITAL LAB): SARS Coronavirus 2: NEGATIVE

## 2019-01-09 LAB — LACTATE DEHYDROGENASE: LDH: 113 U/L (ref 98–192)

## 2019-01-09 MED ORDER — HEPARIN SOD (PORK) LOCK FLUSH 100 UNIT/ML IV SOLN
500.0000 [IU] | Freq: Once | INTRAVENOUS | Status: AC
  Administered 2019-01-09: 500 [IU] via INTRAVENOUS

## 2019-01-09 MED ORDER — SODIUM CHLORIDE 0.9% FLUSH
10.0000 mL | INTRAVENOUS | Status: DC | PRN
  Administered 2019-01-09: 10 mL via INTRAVENOUS
  Filled 2019-01-09: qty 10

## 2019-01-09 MED ORDER — METHYLPREDNISOLONE 4 MG PO TBPK: ORAL_TABLET | ORAL | 0 refills | Status: DC

## 2019-01-09 NOTE — Discharge Instructions (Addendum)
You have been seen in the emergency department for a COVID test.  Your COVID test is negative today.  Please follow-up with your doctor as scheduled.  Return to the emergency department for any acute/concerning symptoms.

## 2019-01-09 NOTE — ED Triage Notes (Signed)
Pt here for COVID test. Pt reports no symptoms but had a CT scan done and per her MD it looked like it could be COVID so she was told to come to the ED because she would be tested faster. Pt denies al symptoms. Pt is deaf and uses a sign interpreter. Pt daughter with her to do sign language.

## 2019-01-09 NOTE — ED Provider Notes (Signed)
[facility] Emergency Department Provider Note  Time seen: 10:31 AM  I have reviewed the triage vital signs and the nursing notes. Sign language interpreter used for this evaluation.   HISTORY  Chief Complaint Abnormal CT  HPI Toni Griffith is a 78 y.o. female with a past medical history of lymphoma, gastric reflux, hypertension, deaf, presents to the emergency department for COVID testing.  According to report as well as review has had a recent CT scan performed 12/26/2022 lymphoma and for treatment purposes.  Patient was supposed to be treated today at oncology by [name].  However per family they were called today by oncology requesting that they come to the emergency department for COVID testing prior to their appointment today given her suspicious findings on CT from 2 weeks ago.  Patient denies any cough, denies any shortness of breath, denies any fever.  Patient is asymptomatic, overall appears very well.   Past Medical History:  Diagnosis Date  . Arthritis   . Cancer [facility])    lymphoma-right hip  . Deaf   . GERD (gastroesophageal reflux disease)   . Hypertension    NOT ON MED AT PRESENT    Patient Active Problem List   Diagnosis Date Noted  . Mediastinal adenopathy   . Encounter for antineoplastic chemotherapy 05/16/2018  . Encounter for antineoplastic immunotherapy 05/16/2018  . Hyponatremia 02/01/2018  . Chronic pain of left knee 12/23/2017  . Chemotherapy-induced peripheral neuropathy ([place]) 11/24/2017  . Pain of both hip joints 11/24/2017  . Encounter for monoclonal antibody treatment for malignancy 11/23/2017  . Goals of care, counseling/discussion 11/01/2017  . 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 Osteoarthritis of left knee 04/27/2017  . Squamous cell lung cancer ([place]) 09/05/2016  . Hypokalemia 04/29/2016  . Neuropathy associated with cancer ([place]) 04/13/2016  . Hypercalcemia 03/30/2016  .  Anemia 03/30/2016  . Non-Hodgkin's lymphoma ([place]) 03/08/2016  . Hodgkin's lymphoma ([place]) 03/08/2016  . 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 Bone metastasis ([place]) 03/03/2016  . Stage 3 chronic kidney disease ([place]) 03/01/2016  . Gait abnormality 02/24/2016  . Nontoxic single thyroid nodule 02/21/2016  . History of coronary artery bypass graft x 2 02/20/2016  . Hiatal hernia 02/04/2016  . Esophageal dysphagia 12/31/2015  . Restless leg syndrome 12/28/2015  . Obesity 12/28/2015  . Mixed hyperlipidemia 12/28/2015  . GERD without esophagitis 12/28/2015  . Essential hypertension 12/28/2015  . Simple chronic bronchitis ([place]) 10/14/2015  . 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 Congenital deafness 10/14/2015    Past Surgical History:  Procedure Laterality Date  . CARDIAC SURGERY  1993  . CORONARY ARTERY BYPASS GRAFT     DOUBLE  . CYST REMOVAL HAND Right   . ENDOBRONCHIAL ULTRASOUND N/A 05/30/2018   Procedure: ENDOBRONCHIAL ULTRASOUND;  Surgeon: Tyler Pita, MD;  Location: [facility] ORS;  Service: Cardiopulmonary;  Laterality: N/A;  . PERIPHERAL VASCULAR CATHETERIZATION N/A 04/06/2016   Procedure: Glori Luis Cath Insertion;  Surgeon: Algernon Huxley, MD;  Location: [facility];  Service: Cardiovascular;  Laterality: N/A;    Prior to Admission medications   Medication Sig Start Date End Date Taking? Authorizing Provider  acetaminophen (TYLENOL) 500 MG tablet Take 500 mg by mouth every 6 (six) hours as needed for moderate pain.     [provider]  allopurinol (ZYLOPRIM) 300 MG tablet Take 1 tablet (300 mg  total) by mouth daily. 11/12/18   Lequita Asal, MD  aspirin EC 81 MG tablet Take 81 mg by mouth daily.    [provider]  atorvastatin (LIPITOR) 80 MG tablet Take 80 mg by mouth every evening.  10/14/15 12/17/18  [provider]  b  complex vitamins tablet Take 1 tablet by mouth daily.    [provider]  Cholecalciferol (VITAMIN D3) 2000 units capsule Take 2,000 Units by mouth daily.    [provider]  diclofenac sodium (VOLTAREN) 1 % GEL APPLY 2 G TOPICALLY 3 (THREE) TIMES DAILY AS NEEDED (LEFT KNEE). 08/29/18   Karen Kitchens, NP  ferrous sulfate 325 (65 FE) MG tablet Take 650 mg by mouth daily with breakfast.    [provider]  FLUoxetine (PROZAC) 40 MG capsule Take 40 mg by mouth daily.  04/20/17   [provider]  gabapentin (NEURONTIN) 100 MG capsule Take 2 capsules (200 mg total) by mouth at bedtime. 05/17/18   Karen Kitchens, NP  HYDROcodone-acetaminophen (NORCO) 5-325 MG tablet Take 1 tablet by mouth every 6 (six) hours as needed for moderate pain. Patient not taking: Reported on 12/17/2018 07/26/18   Lequita Asal, MD  lidocaine-prilocaine (EMLA) cream Apply cream 1 hour before chemotherapy treatment and place small peive of saran wrap over cream to protect clothing 05/06/18   Karen Kitchens, NP  Magnesium 250 MG TABS Take 1 tablet by mouth daily.    [provider]  ondansetron (ZOFRAN ODT) 4 MG disintegrating tablet Take 1 tablet (4 mg total) by mouth every 8 (eight) hours as needed for nausea or vomiting. 06/14/18   Karen Kitchens, NP  pantoprazole (PROTONIX) 40 MG tablet Take 40 mg by mouth daily.  02/04/16   [provider]  vitamin B-12 (CYANOCOBALAMIN) 500 MCG tablet Take 500 mcg by mouth daily.    [provider]  vitamin E 400 UNIT capsule Take 400 Units by mouth daily.     [provider]    No Known Allergies  Family History  Problem Relation Age of Onset  . Lung cancer Mother   . Lung cancer Father   . Diabetes Maternal Uncle   . Myasthenia gravis Maternal Grandmother   . Leukemia Maternal Grandfather     Social History Social History   Tobacco Use  . Smoking status: Former Smoker    Packs/day: 2.00    Years: 40.00     Pack years: 80.00    Types: Cigarettes    Quit date: 11/21/2015    Years since quitting: 3.1  . Smokeless tobacco: Never Used  Substance Use Topics  . Alcohol use: Yes    Comment: rare  . Drug use: No    Review of Systems Constitutional: Negative for fever. ENT: Negative for recent illness/congestion Cardiovascular: Negative for chest pain. Respiratory: Negative for shortness of breath. Gastrointestinal: Negative for abdominal pain Musculoskeletal: Negative for musculoskeletal complaints Neurological: Negative for headache All other ROS negative  ____________________________________________   PHYSICAL EXAM:  VITAL SIGNS: ED Triage Vitals  Enc Vitals Group     BP 01/09/19 0929 108/71     Pulse Rate 01/09/19 0929 97     Resp 01/09/19 0929 16     Temp 01/09/19 0929 98.1 F (36.7 C)     Temp Source 01/09/19 0929 Oral     SpO2 01/09/19 0929 98 %     Weight 01/09/19 0926 162 lb (73.5 kg)     Height 01/09/19  1638 5\' 3"  (1.6 m)     Head Circumference --      Peak Flow --      Pain Score 01/09/19 0926 0     Pain Loc --      Pain Edu? --      Excl. in [place]? --    Constitutional: Alert. Well appearing and in no distress. Eyes: Normal exam ENT      Head: Normocephalic and atraumatic.      Mouth/Throat: Mucous membranes are moist. Cardiovascular: Normal rate, regular rhythm Respiratory: Normal respiratory effort without tachypnea nor retractions. Breath sounds are clear Gastrointestinal: Soft and nontender. No distention.  Musculoskeletal: Nontender with normal range of motion in all extremities.  Neurologic:  No gross focal neurologic deficits  Skin:  Skin is warm, dry and intact.  Psychiatric: Mood and affect are normal.     INITIAL IMPRESSION / ASSESSMENT AND PLAN / ED COURSE  Pertinent labs & imaging results that were available during my care of the patient were reviewed by me and considered in my medical decision making (see chart for details).   Patient presents  emergency department for COVID testing.  Patient was sent by oncology prior to visiting their office for COVID tested given her suspicious CT findings from 2 weeks ago.  Overall the patient appears extremely well, has no symptoms of COVID.  However given possibility of delay of care by oncology we will proceed with rapid testing to help rule out COVID-19 so the patient can go to her oncology appointment today and begin her treatment.  Patient and family agreeable to plan of care.  Patient has no other suspicious findings on physical exam or vitals to warrant further work-up at this time.  Patient is COVID test is negative.  We will discharge.  Brigida Scotti was evaluated in Emergency Department on 01/09/2019 for the symptoms described in the history of present illness. She was evaluated in the context of the global COVID-19 pandemic, which necessitated consideration that the patient might be at risk for infection with the SARS-CoV-2 virus that causes COVID-19. Institutional protocols and algorithms that pertain to the evaluation of patients at risk for COVID-19 are in a state of rapid change based on information released by regulatory bodies including the CDC and federal and state organizations. These policies and algorithms were followed during the patient's care in the ED.  ____________________________________________   FINAL CLINICAL IMPRESSION(S) / ED DIAGNOSES  COVID testing   Harvest Dark, MD 01/09/19 1107

## 2019-01-09 NOTE — ED Notes (Addendum)
Pt sent by doctor for covid test after CT results appear as if patient could have covid.  Patient denies any symptoms of feeling ill including cough, SHOB, fever.  Only reason for CT was for diagnostic purposes related to cancer diagnosis.  VSS all WNL.  Unlabored. Family trying to call to see if MD wanted outpatient test but report were told to come to ED

## 2019-01-09 NOTE — Progress Notes (Signed)
Patient c/o bilateral knee pain ( pain level 8)/ per daughter she will see a MD on Monday for her knee pain.

## 2019-01-10 ENCOUNTER — Other Ambulatory Visit: Payer: Self-pay | Admitting: *Deleted

## 2019-01-10 ENCOUNTER — Telehealth: Payer: Self-pay | Admitting: Pulmonary Disease

## 2019-01-10 NOTE — Telephone Encounter (Signed)
Appt. made. Nothing further needed

## 2019-01-28 ENCOUNTER — Telehealth: Payer: Self-pay | Admitting: Pulmonary Disease

## 2019-01-28 NOTE — Telephone Encounter (Signed)

## 2019-01-29 ENCOUNTER — Encounter: Payer: Self-pay | Admitting: Pulmonary Disease

## 2019-01-29 ENCOUNTER — Ambulatory Visit (INDEPENDENT_AMBULATORY_CARE_PROVIDER_SITE_OTHER): Payer: Medicare HMO | Admitting: Pulmonary Disease

## 2019-01-29 ENCOUNTER — Other Ambulatory Visit: Payer: Self-pay

## 2019-01-29 VITALS — BP 132/74 | HR 85 | Temp 97.5°F | Ht 63.0 in | Wt 164.4 lb

## 2019-01-29 DIAGNOSIS — C3490 Malignant neoplasm of unspecified part of unspecified bronchus or lung: Secondary | ICD-10-CM | POA: Diagnosis not present

## 2019-01-29 DIAGNOSIS — R9389 Abnormal findings on diagnostic imaging of other specified body structures: Secondary | ICD-10-CM

## 2019-01-29 DIAGNOSIS — C8590 Non-Hodgkin lymphoma, unspecified, unspecified site: Secondary | ICD-10-CM | POA: Diagnosis not present

## 2019-01-29 MED ORDER — PREDNISONE 20 MG PO TABS: 20.0000 mg | ORAL_TABLET | Freq: Every day | ORAL | 2 refills | Status: AC

## 2019-01-29 NOTE — Progress Notes (Signed)
Subjective:    Patient ID: Toni Griffith, female    DOB: 1941-05-02, 78 y.o.   MRN: [id number]  HPI Patient is a 78 year old former smoker who is followed by Dr. Nolon Stalls for issues of squamous cell carcinoma of the lung and stage IVb gray zone lymphoma.  She is being re-referred due to abnormal chest CT.  The patient is currently on Keytruda as a checkpoint inhibitor for her squamous cell carcinoma of the lung.  She was assessed for her 76th cycle of Beryle Flock on 09 January 2019.  Previous to that she had been seen at the oncology clinic on 17 Dec 2018.  As was then and is now she has not had any symptoms of shortness of breath, cough, fevers, chills or sweats.  She has not had any nausea vomiting or diarrhea.  Her only complaint is that of bilateral knee pain which to my recollection she had on her initial evaluation here (October 2019) though she states that this has gotten worse.  She is unsure whether her knee pain has gotten worse on the Keytruda.  She was evaluated by orthopedics who injected the knee however did not do an aspiration to evaluate fluid.  The patient has been in good spirits.  She has had no issues with loss of appetite though she has had some modest weight loss.  Her most recent CT scan of the chest was performed on 28 May she was noted at that time to have changes consistent with treatment to her squamous cell carcinoma mainly with dense consolidation of the perihilar left lung and lingula and dense bandlike perifissural consolidation of the right upper lobe.  As noted these are believed to be post treatment changes.  She had groundglass opacities that were scattered diffusely bilaterally.  The findings are nonspecific and could represent infection, inflammation and or drug toxicity.  As noted above the patient is totally asymptomatic in this regard.  Per my discussion with [name] prior to the patient's visit here that concern is whether she should be able to  continue [facility].  The patient did receive a prednisone taper she stated that she noticed improvement on her knee pain.  As noted she has had no dyspnea or cough and nothing that she would perceive was an improvement on respiratory symptoms which she does not endorse.  Note is made that the history is taken completely through an interpreter as the patient is deaf and requires interpretation with American sign language.   Review of Systems  Constitutional: Negative.   HENT: Negative.   Eyes: Negative.   Respiratory: Negative.   Cardiovascular: Negative.   Gastrointestinal: Negative.   Musculoskeletal: Positive for arthralgias (Bilateral knees) and joint swelling.  All other systems reviewed and are negative.      Objective:   Physical Exam Constitutional:      General: She is not in acute distress.    Appearance: She is well-developed. She is not ill-appearing.     Comments: Overweight in no acute distress.   HENT:     Head: Normocephalic and atraumatic.     Right Ear: External ear normal.     Left Ear: External ear normal.  Eyes:     General: No scleral icterus.    Conjunctiva/sclera: Conjunctivae normal.     Pupils: Pupils are equal, round, and reactive to light.  Neck:     Musculoskeletal: Normal range of motion and neck supple.     Thyroid: No thyromegaly.  Vascular: No JVD.     Trachea: No tracheal deviation.  Cardiovascular:     Rate and Rhythm: Normal rate and regular rhythm.     Heart sounds: Normal heart sounds.  Pulmonary:     Effort: Pulmonary effort is normal. No respiratory distress.     Breath sounds: Normal breath sounds.  Abdominal:     General: There is no distension.     Palpations: Abdomen is soft.  Musculoskeletal:        General: Deformity (Osteoarthritis changes, hands, knees.) present.     Right knee: She exhibits swelling.     Left knee: She exhibits swelling.  Lymphadenopathy:     Cervical: No cervical adenopathy.  Skin:    General: Skin  is warm and dry.     Coloration: Skin is not pale.     Findings: No erythema or rash.  Neurological:     Mental Status: She is alert and oriented to person, place, and time.     Comments: Patient is deaf. No focal deficits noted otherwise.    CT scan reviewed and is as below:   Findings were discussed with the patient and her son who was present with her.  This was done via interpreter.    Assessment & Plan:   1.  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  The patient has not had any symptoms suggestive of infection and no other inflammatory process occurring at present.  The severity of her toxicity however is grade 1.  The circumstances the checkpoint inhibitor does not need to be withheld.  The patient should be observed closely for development of symptoms and steroids at low doses may be of help.  A treatment algorithm can be seen in the article titled "Management of Pulmonary Toxicity Associated with Immune Checkpoint Inhibitors" by Dairl Ponder, Grgoire Prvot, Samia Collot, Haskell Riling, Marland Kitchen Mazires in the [employer] [place] Respiratory Review 2019 28: [id number]; DOI: 10.1183/16000617.0012-2019.  Additionally further information can be found in the article by Oleta Mouse "Toxicity of tumor and immune checkpoint inhibitors-more attention should be paid" in the translational lung cancer research December 2019 eddition (Transl Lung Cancer Res. 2019 Dec; 8(6): 1125-1133.doi: 10.21037/tlcr.2019.11.26).  A sample treatment chart is as below:  This useful chart is from the first article cited above.  I favor treating the patient with a modest dose of prednisone daily and continued with treatment with a checkpoint inhibitor as she appears to be asymptomatic and otherwise tolerating it well.  However she will need close follow-up.  I will see her back in 6 to 8 weeks.  Recommend that a CT scan of the  chest be repeated after her next therapy with Keytruda.  The patient has been advised to notify either myself or [name] with any respiratory issues that may develop.  Development of respiratory issues with then necessitate discontinuing the checkpoint inhibitor.  2.  Squamous cell carcinoma of the lung: This issue adds complexity to her management.  3.  Stage IVb gray zone lymphoma: This issue adds complexity to her management.  Thank you for allowing [place] to see Mrs. Kunz again.  We will continue to follow her along with you.  This chart was dictated using voice recognition software/Dragon.  Despite best efforts to proofread, errors can occur which can change the meaning.  Any change was purely unintentional.

## 2019-01-29 NOTE — Patient Instructions (Signed)
1.  Prednisone 20 mg daily with the largest meal of the day.  2.  We will see you in follow-up in 2 weeks time.  3.  Call sooner if you lobe cough or shortness of breath or fever.  4.  At this point it is not necessary to stop your Keytruda.

## 2019-02-05 ENCOUNTER — Other Ambulatory Visit: Payer: Self-pay | Admitting: Hematology and Oncology

## 2019-02-05 DIAGNOSIS — C7951 Secondary malignant neoplasm of bone: Secondary | ICD-10-CM

## 2019-02-05 DIAGNOSIS — C8330 Diffuse large B-cell lymphoma, unspecified site: Secondary | ICD-10-CM

## 2019-02-05 DIAGNOSIS — R911 Solitary pulmonary nodule: Secondary | ICD-10-CM

## 2019-02-05 DIAGNOSIS — E876 Hypokalemia: Secondary | ICD-10-CM

## 2019-02-05 DIAGNOSIS — C8198 Hodgkin lymphoma, unspecified, lymph nodes of multiple sites: Secondary | ICD-10-CM

## 2019-02-13 ENCOUNTER — Telehealth: Payer: Self-pay | Admitting: Pulmonary Disease

## 2019-02-13 NOTE — Telephone Encounter (Signed)
Called patient for COVID-19 pre-screening for in office visit.  Have you recently traveled any where out of the local area in the last 2 weeks? no  Have you been in close contact with a person diagnosed with COVID-19 or someone awaiting results within the last 2 weeks? no  Do you currently have any of the following symptoms? If so, when did they start? Cough     Diarrhea   Joint Pain Fever      Muscle Pain   Red eyes Shortness of breath   Abdominal pain  Vomiting Loss of smell    Rash    Sore Throat Headache    Weakness   Bruising or bleeding   Okay to proceed with visit.      

## 2019-02-14 ENCOUNTER — Encounter: Payer: Self-pay | Admitting: Pulmonary Disease

## 2019-02-14 ENCOUNTER — Ambulatory Visit (INDEPENDENT_AMBULATORY_CARE_PROVIDER_SITE_OTHER): Payer: Medicare HMO | Admitting: Pulmonary Disease

## 2019-02-14 ENCOUNTER — Other Ambulatory Visit: Payer: Self-pay

## 2019-02-14 VITALS — BP 122/64 | HR 90 | Temp 98.2°F | Ht 63.0 in | Wt 170.0 lb

## 2019-02-14 DIAGNOSIS — C8590 Non-Hodgkin lymphoma, unspecified, unspecified site: Secondary | ICD-10-CM

## 2019-02-14 DIAGNOSIS — R590 Localized enlarged lymph nodes: Secondary | ICD-10-CM | POA: Diagnosis not present

## 2019-02-14 DIAGNOSIS — C3490 Malignant neoplasm of unspecified part of unspecified bronchus or lung: Secondary | ICD-10-CM

## 2019-02-14 DIAGNOSIS — L0100 Impetigo, unspecified: Secondary | ICD-10-CM

## 2019-02-14 DIAGNOSIS — R9389 Abnormal findings on diagnostic imaging of other specified body structures: Secondary | ICD-10-CM

## 2019-02-14 NOTE — Patient Instructions (Addendum)
1.  You can decrease your prednisone to half a tablet daily until advised otherwise.  2.  We will get a CT scan of the chest WITHOUT contrast to follow-up on the inflammation noted on the prior CT  3.  We will see you back in 3 weeks time call sooner should any new difficulties arise

## 2019-02-17 ENCOUNTER — Encounter: Payer: Self-pay | Admitting: Pulmonary Disease

## 2019-02-17 NOTE — Progress Notes (Signed)
Subjective:    Patient ID: Toni Griffith, female    DOB: [birth year], 78 y.o.   MRN: [id number]   Note that communication with this patient is done through a sign language interpreter.  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  She is currently on Keytruda as a checkpoint inhibitor for her squamous cell carcinoma of the lung.She was assessed for her 10th cycle of Beryle Flock on 09 January 2019.  Previous to that she had been seen at the oncology clinic on 17 Dec 2018.  As was then and is now she has not had any symptoms of shortness of breath, cough, fevers, chills or sweats.  She has not had any nausea vomiting or diarrhea.  At her prior visit on 1 July her only complaint was that of severe bilateral knee pain, since that visit though her knee pain has improved.  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  However this is grade 1 as she had no symptoms.  She has been placed on 20 mg of prednisone daily which she is tolerating well with the exception of inability to sleep at night.  She would like to decrease the dose.  As noted on her prior note in these situations there is no need for discontinuing the checkpoint inhibitor unless symptoms ensue or radiographic picture worsens.  She continues to have no issues with dyspnea, cough, sputum production or hemoptysis.  She has had no fevers, chills or sweats.  As noted her prior issues with knee pain have resolved.  She feels well and looks well.  Review of Systems  Constitutional: Negative.   HENT: Negative.   Eyes: Negative.   Respiratory: Negative.   Cardiovascular: Negative.   Gastrointestinal: Negative.   Musculoskeletal: Negative for arthralgias, joint swelling and myalgias.  All other systems reviewed and are negative.      Objective:   Physical Exam Constitutional:       General: She is not in acute distress.    Appearance: She is well-developed. She is not ill-appearing.     Comments: Overweight in no acute distress.   HENT:     Head: Normocephalic and atraumatic.     Nose:     Comments: Nose/mouth/throat not examined due to masking requirements for COVID 19. Eyes:     General: No scleral icterus.    Conjunctiva/sclera: Conjunctivae normal.  Neck:     Musculoskeletal: Neck supple.     Thyroid: No thyromegaly.     Vascular: No JVD.     Trachea: No tracheal deviation.  Cardiovascular:     Rate and Rhythm: Normal rate and regular rhythm.     Heart sounds: Normal heart sounds.  Pulmonary:     Effort: Pulmonary effort is normal. No respiratory distress.     Breath sounds: Normal breath sounds.  Abdominal:     General: There is no distension.     Palpations: Abdomen is soft.  Musculoskeletal:        General: Deformity (Osteoarthritis changes, hands, knees.) present.     Right knee: She exhibits no swelling.     Left knee: She exhibits no swelling.  Lymphadenopathy:     Cervical: No cervical adenopathy.  Skin:    General: Skin is warm and dry.     Coloration: Skin is not pale.  Findings: No erythema or rash.  Neurological:     Mental Status: She is alert and oriented to person, place, and time.     Comments: Patient is deaf. No focal deficits noted otherwise.           Assessment & Plan:   1.  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  The patient has not had any symptoms suggestive of infection and no other inflammatory process occurring at present.  The severity of her toxicity however is grade 1.  The circumstances the checkpoint inhibitor does not need to be withheld.  The patient should be observed closely for development of symptoms and steroids at low doses may be of help.  See previous citations on my note of 1 July.  The patient has return  earlier than I had wanted her to return previously.  We will proceed with repeating a CT scan of the chest.  In the meantime, I have asked her to decrease the prednisone dose to 10 mg daily as she is having some issues with insomnia.  Depending on what the CT scan of the chest shows we will determine whether she can be tapered off the steroids altogether.  We will see her in follow-up in 3 weeks time. The patient has been advised to notify either myself or [name] with any respiratory issues that may develop.  Development of respiratory issues with then necessitate discontinuing the checkpoint inhibitor altogether.  2.  Squamous cell carcinoma of the lung: This issue adds complexity to her management.  3.  Stage IVb gray zone lymphoma: This issue adds complexity to her management.    This chart was dictated using voice recognition software/Dragon.  Despite best efforts to proofread, errors can occur which can change the meaning.  Any change was purely unintentional.

## 2019-02-19 ENCOUNTER — Other Ambulatory Visit: Payer: Self-pay

## 2019-02-19 ENCOUNTER — Encounter: Payer: Self-pay | Admitting: Hematology and Oncology

## 2019-02-25 NOTE — Progress Notes (Signed)
[facility]  [address] [place], [place] [address] Phone: [phone]  Fax: [phone]   Clinic Day:  02/27/2019  Referring physician: Francesca Oman, DO  Chief Complaint: Toni Griffith is a 78 y.o. 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  HPI: The patient was last seen in the medical oncology clinic on 01/09/2019. At that time, she felt fine. She denied any respiratory symptoms or fever. Exam was stable. CT scans revealed no evidence of progressive disease.  There were a few diffuse scattered ground-glass opacities.  She was treated with a Solumedrol Dosepak.  She was referred to Dr Patsey Berthold.  The patient had a follow up with Dr. Patsey Berthold on 01/29/2019 and 02/14/2019. She has been cleared to do treatment again. She is taking 10 mg of prednisone/day.  Chest CT on 02/26/2019 revealed near resolution of diffuse bilateral ground-glass opacity with central predominance in the lungs previously has nearly resolved in the interval.  The collapse/consolidative change in the inferior right upper lobe and parahilar left lung was stable.  There were atable borderline enlarged prevascular lymph nodes.  During the interim, the patient has done well. She notes a bruise on her arms. Her breathing is good, but notes that she can not breath with the mask on. She notes some left knee pain.  Her appetite is good. Her weight is down 6 pounds. She notes edema in her left breast. She does not perform monthly breast exams. She would like to have her breast examined today. Her last mammogram was 2 years ago.    Past Medical History:  Diagnosis Date   Arthritis    Cancer ([place])    lymphoma-right hip   Deaf    GERD (gastroesophageal reflux disease)    Hypertension    NOT ON MED AT PRESENT    Past Surgical History:  Procedure Laterality Date   CARDIAC SURGERY  1993    CORONARY ARTERY BYPASS GRAFT     DOUBLE   CYST REMOVAL HAND Right    ENDOBRONCHIAL ULTRASOUND N/A 05/30/2018   Procedure: ENDOBRONCHIAL ULTRASOUND;  Surgeon: Tyler Pita, MD;  Location: [facility] ORS;  Service: Cardiopulmonary;  Laterality: N/A;   PERIPHERAL VASCULAR CATHETERIZATION N/A 04/06/2016   Procedure: Glori Luis Cath Insertion;  Surgeon: Algernon Huxley, MD;  Location: [facility];  Service: Cardiovascular;  Laterality: N/A;    Family History  Problem Relation Age of Onset   Lung cancer Mother    Lung cancer Father    Diabetes Maternal Uncle    Myasthenia gravis Maternal Grandmother    Leukemia Maternal Grandfather     Social History:  reports that she quit smoking about 3 years ago. Her smoking use included cigarettes. She has a 80.00 pack-year smoking history. She has never used smokeless tobacco. She reports current alcohol use. She reports that she does not use drugs.  She smoked 1 1/2 packs per day for 20 years (30 pack year smoking history) until 10/2015. She is hearing impaired (deaf). She lives in [place] with her boyfriend. Her daughter lives in [place]. Her daughter, [name]'s contact number is [phone]. She is living with her son. The patient is accompanied by her daughter (over the phone), the interpreter, and Dr. Patsey Berthold (over the phone) today.  Allergies: No Known Allergies  Current Medications: Current Outpatient Medications  Medication Sig Dispense Refill   acetaminophen (TYLENOL) 500 MG  tablet Take 500 mg by mouth every 6 (six) hours as needed for moderate pain.      allopurinol (ZYLOPRIM) 300 MG tablet TAKE 1 TABLET BY MOUTH EVERY DAY 90 tablet 0   aspirin EC 81 MG tablet Take 81 mg by mouth daily.     atorvastatin (LIPITOR) 80 MG tablet Take 80 mg by mouth every evening.      b complex vitamins tablet Take 1 tablet by mouth daily.     Cholecalciferol (VITAMIN D3) 2000 units capsule Take 2,000 Units by mouth daily.     diclofenac  sodium (VOLTAREN) 1 % GEL APPLY 2 G TOPICALLY 3 (THREE) TIMES DAILY AS NEEDED (LEFT KNEE). 100 g 1   DULoxetine (CYMBALTA) 20 MG capsule Take 10 mg by mouth.      ferrous sulfate 325 (65 FE) MG tablet Take 650 mg by mouth daily with breakfast.     FLUoxetine (PROZAC) 10 MG tablet Take 10 mg by mouth daily.     gabapentin (NEURONTIN) 100 MG capsule Take 2 capsules (200 mg total) by mouth at bedtime. 60 capsule 3   HYDROcodone-acetaminophen (NORCO) 5-325 MG tablet Take 1 tablet by mouth every 6 (six) hours as needed for moderate pain. 30 tablet 0   lidocaine-prilocaine (EMLA) cream Apply cream 1 hour before chemotherapy treatment and place small peive of saran wrap over cream to protect clothing 30 g 3   Magnesium 250 MG TABS Take 1 tablet by mouth daily.     pantoprazole (PROTONIX) 40 MG tablet Take 40 mg by mouth daily.      predniSONE (DELTASONE) 20 MG tablet Take 1 tablet (20 mg total) by mouth daily. Take with largest meal of day. 30 tablet 2   predniSONE (DELTASONE) 20 MG tablet Take 20 mg by mouth daily with breakfast.     vitamin B-12 (CYANOCOBALAMIN) 500 MCG tablet Take 500 mcg by mouth daily.     vitamin E 400 UNIT capsule Take 400 Units by mouth daily.      ondansetron (ZOFRAN ODT) 4 MG disintegrating tablet Take 1 tablet (4 mg total) by mouth every 8 (eight) hours as needed for nausea or vomiting. (Patient not taking: Reported on 02/27/2019) 30 tablet 1   No current facility-administered medications for this visit.    Facility-Administered Medications Ordered in Other Visits  Medication Dose Route Frequency Provider Last Rate Last Dose   heparin lock flush 100 unit/mL  500 Units Intravenous Once Corcoran, Melissa C, MD       heparin lock flush 100 unit/mL  500 Units Intravenous Once Corcoran, Melissa C, MD       sodium chloride flush (NS) 0.9 % injection 10 mL  10 mL Intravenous PRN Mike Gip, Melissa C, MD   10 mL at 04/05/17 1034   sodium chloride flush (NS) 0.9 %  injection 10 mL  10 mL Intravenous PRN Lequita Asal, MD   10 mL at 02/27/19 1033    Review of Systems  Constitutional: Positive for weight loss (6 lbs). Negative for chills, diaphoresis, fever and malaise/fatigue.       Doing well.  HENT: Positive for hearing loss (deaf). Negative for congestion, ear pain, nosebleeds, sinus pain, sore throat and tinnitus.   Eyes: Negative.  Negative for blurred vision, double vision, photophobia and pain.  Respiratory: Negative.  Negative for cough, hemoptysis, sputum production, shortness of breath and wheezing.   Cardiovascular: Negative.  Negative for chest pain, palpitations, orthopnea and leg swelling.  Gastrointestinal: Negative.  Negative  for abdominal pain, blood in stool, constipation, diarrhea, heartburn, nausea and vomiting.       Eating well.   Genitourinary: Negative.  Negative for dysuria, frequency, hematuria and urgency.  Musculoskeletal: Positive for joint pain (arthritic, left knee). Negative for back pain, falls, myalgias and neck pain.  Skin: Negative.  Negative for itching and rash.  Neurological: Negative.  Negative for dizziness, tingling, sensory change, focal weakness, weakness and headaches.  Endo/Heme/Allergies: Bruises/bleeds easily (bruises easily, arms).  Psychiatric/Behavioral: Negative.  Negative for depression and memory loss. The patient is not nervous/anxious and does not have insomnia.   All other systems reviewed and are negative.  Performance status (ECOG): 2  Vitals Blood pressure 140/75, pulse 91, temperature (!) 97.3 F (36.3 C), temperature source Tympanic, resp. rate 18, height 5' 3" (1.6 m), weight 164 lb 7.4 oz (74.6 kg), SpO2 97 %.   Physical Exam  Constitutional: She is oriented to person, place, and time. She appears well-developed and well-nourished. No distress.  Well appearing woman sitting comfortably in a wheelchair in no acute distress.   HENT:  Head: Normocephalic and atraumatic.    Mouth/Throat: Oropharynx is clear and moist.  Short gray hair.  Wearing a mask.  Dentures (loose).  Eyes: Pupils are equal, round, and reactive to light. Conjunctivae and EOM are normal. No scleral icterus.  Hazel eyes.   Neck: Normal range of motion. Neck supple. No JVD present.  Cardiovascular: Normal rate, regular rhythm and normal heart sounds.  No murmur heard. Pulmonary/Chest: Effort normal and breath sounds normal. No respiratory distress. She has no wheezes. She has no rales. Right breast exhibits no skin change (fibrocystic changes). Left breast exhibits no skin change (fibrocystic changes) and no tenderness.  Abdominal: Soft. Bowel sounds are normal. She exhibits no distension and no mass. There is no abdominal tenderness. There is no rebound and no guarding.  Musculoskeletal: Normal range of motion.        General: Edema (chronic) present.  Lymphadenopathy:    She has no cervical adenopathy.    She has no axillary adenopathy.       Right: No supraclavicular adenopathy present.       Left: No supraclavicular adenopathy present.  Neurological: She is alert and oriented to person, place, and time.  Skin: Skin is warm and dry. No rash noted. She is not diaphoretic. No erythema. No pallor.  Psychiatric: She has a normal mood and affect. Her behavior is normal. Judgment and thought content normal.  Nursing note and vitals reviewed.   Admissions: 02/20/2016- 02/26/2016: [facility] She had renal failure and hypercalcemia. She was treated with IVF, calcitonin and Zometa.The patient has a history of coronary artery disease s/p CABG in 1993. She has no history of heart failure.  01/20/2018 - 01/23/2018: [facility]. She presented with nausea, vomiting, diarrhea and generalized weakness. Work-up showed hypokalemia, hypomagnesemia, dehydration and UTI. She was treated with IV fluids and empiric IV antibiotics. Potassium and  magnesium were repleted. Urine culture showed E. coli. She was discharged on Augmentin. She was switched from HCTZ to chorthalidone. [facility]   Radiology studies: 02/20/2016: Chest, abdomen and pelvis CT revealed a 3.7 cm subpleural mass within the left lower lobe, a 1.1 cm right lower lobe subpleural nodule , 9 mm RUL nodule, and 0.4 cm lingular nodule. Between the left second, third, and fourth ribs there was 1.9 cm soft tissue mass. There was a 2.5 cm low-attenuation  nodule in the left lobe of the thyroid. There were prominent left axillary nodes (up to 1.0 cm). There was no definite mediastinal adenopathy. There was a 5.5 x 4.6 cm cystic lesion in the left hepatic lobe. There was a 3.1 cm nodule in the right adrenal gland. There was a 1.2 cm right retrocaval lymph node with additional prominent subcentimeter retroperitoneal nodes. There was 1.0 cm left common iliac node. There was a 6.2 x 5.2 cm soft tissue mass within the right ilium.  02/21/2016:Head MRIrevealed no evidence of malignancy.  02/21/2016: Thyroid ultrasound revealed a 2.4 cm solid nodule in the inferior aspect of the left lobe 03/03/2016: PET scanrevealed a hypermetabolic left lower lobe mass, bilateral pleural and pulmonary parenchymal nodular metastasis, a large pleural lesion invading the anterior left chest wall. There was activity in the superior left ocular orbit. There was a large lesion centered in the right iliac wing with soft tissue extension. There were hypermetabolic right external iliac lymph nodes 06/06/2016:PET scanrevealed significant interval response to therapy. There was significant decrease in size and FDG uptake associated with left lower lobe lung lesion. There was resolution of left-sided pleural base metastasis and left axillary and anterior mediastinal nodal metastasis. There was resolution of previous hypermetabolic soft tissue metastasis/nodal  metastasis posterior to the IVC. There was marked improvement in hypermetabolic metastasis involving the right iliac wing. There was persistent right upper lobe lung nodule which exhibits intense radiotracer uptake which may represent a focus of metastatic disease or synchronous primary lung neoplasm. 06/01/2017:PET scanrevealed no new or progressive findings. The 9 mm RUL pulmonary nodule remained hypermetabolic (SUV 83.3, previously 7.3) and no change in size. The index lesion in the posterior left lower lobe was minimally smaller on CT imaging with very low level FDG uptake (SUV 1.6). There was no change in the appearance at site of previous disease at the right iliac wing (SUV 1.9 compared to 2.6 previously). 01/29/2017: PET scanrevealed interval progression of hypermetabolic nodules in the right upper (13 mm compared to 9 mm; SUV 10) and left lower lobes (26 mm compared to 22 mm; SUV 7.5). There was new hypermetabolic focus of activity along the anterior left pleura although no underlying pleural or lung mass was evident. 09/18/2017: PET scanrevealed a mixed appearance, with marked improvement in the prior 2 pulmonary nodules; but with new Deauville 5 left common iliac lymph node enlargement; new Deauville 5 right paraspinal lesion; and new Deauville 4 left infrahilar nodal activity. There was a focus of accentuated metabolic activity which had been present along the right lower submandibular gland but currently seemed to project over the subcutaneous tissues without CT correlate. Current maximum SUV 10.5, previously 9.7. There was increased accentuated activity along the left lateral spurring at L2-3. While typically such activity is inflammatory due to spurring, this had increased significantly since the prior exam and merit surveillance. There was a new 8 by 5 mm right upper lobe pulmonary nodule, Deauville 2, which also merits surveillance. 01/29/2018: PET scanrevealed persistent and  progressive areas of hypermetabolism within the right submandibular gland, lingula of the left lung, left common iliac nodal chain, and right lumbar paraspinal soft tissues. Findings were compatible with Deauville criteria 4 and 5. There was a new nodule is identified within the lingula which demonstrated intense uptake compatible with Deauville criteria 5.  05/14/2018: Chest, abdomen, and pelvis CTrevealed enlarging mediastinal adenopathy and right upper lobe nodule, indicative of disease progression. Necrotic lingular nodules were grossly stable in size with developing surrounding  collapse/consolidation, possibly treatment related. There was slight decrease in size of a left external iliac lymph node and right lumbar paraspinal soft tissue lesion. There was a tiny left pleural effusion. 06/24/2018: Head MRI revealed no evidence of intracranial metastasis. 09/03/2018: Chest, abdomen, and pelvis CTrevealed interval decrease in size of previous index right paratracheal and left hilar lymph nodes. Left-sided pre-vascular lymph nodes have also decreased in size in the interval. There was a high left paratracheal lymph node however which has increased in size (0.6 cm to 1.1 cm). There was progressive masslike architectural distortion within the perihilar left lung and lingula. There were surrounding areas of fibrosis and ground-glass attenuation. These findings may reflect progressive post treatment changes. In the absence of interval treatment progressive neoplasm can not be excluded. There was no residual or recurrent adenopathy within the abdomen or pelvis. The rright paraspinal soft tissue nodule was not confidently visualized. She has a hiatal hernia and aortic atherosclerosis. 12/26/2018:  Chest, abdomen, and pelvis CT revealed interval increase in dense consolidation of perihilar left lung and lingula and a dense bandlike perifissural consolidation of the inferior right upper lobe, in keeping  with evolving post treatment change. There was an unchanged small left pleural effusion. There was new, diffusely scattered ground-glass opacity bilaterally, with and unusual atoll like appearance and central clearing, most conspicuous in the right upper lobe. These findings were nonspecific and infectious or inflammatory, and particularly drug toxicity is a differential consideration. There was no change in enlarged mediastinal and AP window lymph nodes. There was no evidence of distant metastatic disease or lymphadenopathy in the abdomen or pelvis. 02/26/2019:  Chest CT revealed near resolution of diffuse bilateral ground-glass opacity with central predominance in the lungs previously has nearly resolved in the interval.  The collapse/consolidative change in the inferior right upper lobe and parahilar left lung was stable.  There were atable borderline enlarged prevascular lymph nodes.   Infusion on 02/27/2019  Component Date Value Ref Range Status   Magnesium 02/27/2019 2.0  1.7 - 2.4 mg/dL Final   Performed at [facility], [address] [place], [place] [address]   Uric Acid, Serum 02/27/2019 3.8  2.5 - 7.1 mg/dL Final   Performed at [facility], [address] [place], [place] [address]   LDH 02/27/2019 140  98 - 192 U/L Final   Performed at [facility], [address] [place], [place] [address]   Sodium 02/27/2019 134* 135 - 145 mmol/L Final   Potassium 02/27/2019 3.5  3.5 - 5.1 mmol/L Final   Chloride 02/27/2019 104  98 - 111 mmol/L Final   CO2 02/27/2019 21* 22 - 32 mmol/L Final   Glucose, Bld 02/27/2019 154* 70 - 99 mg/dL Final   BUN 02/27/2019 24* 8 - 23 mg/dL Final   Creatinine, Ser 02/27/2019 0.97  0.44 - 1.00 mg/dL Final   Calcium 02/27/2019 8.8* 8.9 - 10.3 mg/dL Final   Total Protein 02/27/2019 7.0  6.5 - 8.1 g/dL Final   Albumin 02/27/2019 3.1* 3.5 - 5.0 g/dL Final   AST 02/27/2019 26  15 - 41 U/L Final   ALT  02/27/2019 23  0 - 44 U/L Final   Alkaline Phosphatase 02/27/2019 77  38 - 126 U/L Final   Total Bilirubin 02/27/2019 0.9  0.3 - 1.2 mg/dL Final   GFR calc non Af Amer 02/27/2019 57* >60 mL/min Final   GFR calc Af Amer 02/27/2019 >60  >60 mL/min Final   Anion  gap 02/27/2019 9  5 - 15 Final   Performed at [facility], [address] [place], [place] [address]   WBC 02/27/2019 9.0  4.0 - 10.5 K/uL Final   RBC 02/27/2019 4.09  3.87 - 5.11 MIL/uL Final   Hemoglobin 02/27/2019 13.5  12.0 - 15.0 g/dL Final   HCT 02/27/2019 40.0  36.0 - 46.0 % Final   MCV 02/27/2019 97.8  80.0 - 100.0 fL Final   MCH 02/27/2019 33.0  26.0 - 34.0 pg Final   MCHC 02/27/2019 33.8  30.0 - 36.0 g/dL Final   RDW 02/27/2019 15.6* 11.5 - 15.5 % Final   Platelets 02/27/2019 341  150 - 400 K/uL Final   nRBC 02/27/2019 0.0  0.0 - 0.2 % Final   Neutrophils Relative % 02/27/2019 76  % Final   Neutro Abs 02/27/2019 6.8  1.7 - 7.7 K/uL Final   Lymphocytes Relative 02/27/2019 11  % Final   Lymphs Abs 02/27/2019 1.0  0.7 - 4.0 K/uL Final   Monocytes Relative 02/27/2019 8  % Final   Monocytes Absolute 02/27/2019 0.8  0.1 - 1.0 K/uL Final   Eosinophils Relative 02/27/2019 3  % Final   Eosinophils Absolute 02/27/2019 0.3  0.0 - 0.5 K/uL Final   Basophils Relative 02/27/2019 1  % Final   Basophils Absolute 02/27/2019 0.1  0.0 - 0.1 K/uL Final   Immature Granulocytes 02/27/2019 1  % Final   Abs Immature Granulocytes 02/27/2019 0.08* 0.00 - 0.07 K/uL Final   Performed at [facility], [address], [place], [place] [address]    Assessment:  Toni Griffith is a 77 y.o. female with stage IVB B-cell lymphoma, unclassifiable, with features intermediate between diffuse large B-cell lymphoma and classical Hodgkin's lymphoma ("gray zone" lymphoma). She underwent right iliac wing bone/bone marrow biopsy on 03/08/2016.  PET scanon 03/03/2016 revealed a hypermetabolic left  lower lobe mass, bilateral pleural and pulmonary parenchymal nodular metastasis, a large pleural lesion invading the anterior left chest wall. There was activity in the superior left ocular orbit. There was a large lesion centered in the right iliac wing with soft tissue extension. There were hypermetabolic right external iliac lymph nodes  [place] 04/04/2016 revealed an EF of 55-60%. Hepatitis B and C testing was negative on 03/30/2016. G6PD assay was normal. She declined LP with MTX prophylaxis.  She received 6 cycles of mini-RCHOP(04/07/2016 - 07/21/2016). She has had some transient fingertip numbness.  She received radiationto the right iliac wing, from 10/11/2016 - 11/01/2016. She received 3000 cGy over 3 weeks.  PET scanon 01/29/2017 revealed interval progression of hypermetabolic nodules in the right upper (13 mm compared to 9 mm; SUV 10) and left lower lobes (26 mm compared to 22 mm; SUV 7.5). There was new hypermetabolic focus of activity along the anterior left pleura although no underlying pleural or lung mass was evident.  She has squamous cell lung cancer. Foundation Oneon the lung biopsy on 03/15/2018 revealed MS-stable, tumor mutational burden 5 Muts/Mb, AKT2 amplification, CCNE1 amplification, CREBBP R1446L, KDR amplification, KIT amplification, PDGFRA amplification, PTEN loss exons 2-4, and TG62 splice site 694+8N>I. Mar[place] Kitchen There were no alterations in EGFR, KRAS, RET, ALK, MET, ERBB2, BRAF, ROS1. PDL-1IHC analysis revealed a TPS score of 50%.  CT guided left lower lobe nodule biopsyon 02/19/2017 confirmed squamous cell carcinoma. She underwent SBRT08/27/2018 - 04/09/2017. She received 5000 cGy in 5 fractions to the left lower lobe lesion. She underwent SBRTto a RUL lesion from 05/23/2017 -  06/18/2017.  CT guided left upper lobe nodule biopsyon 02/20/2018 revealedsquamous cell lung cancer. She received SBRTfrom 04/09/2018 - 04/15/2018. She received  6000 cGy in 5 fractions.   Bronchoscopyon 05/30/2018 revealed no endobronchial lesions. The 2 cm pre-carinal lymph node was biopsied. Pathology revealed metastatic non-small cell carcinoma, favor squamous cell carcinoma.  PET scanon 09/18/2017 revealed a mixed appearance, with marked improvement in the prior 2 pulmonary nodules; but with new Deauville 5 left common iliac lymph node enlargement; new Deauville 5 right paraspinal lesion; and new Deauville 4 left infrahilar nodal activity. There was a focus of accentuated metabolic activity which had been present along the right lower submandibular gland but currently seemed to project over the subcutaneous tissues without CT correlate. Current maximum SUV 10.5, previously 9.7. There was increased accentuated activity along the left lateral spurring at L2-3. While typically such activity is inflammatory due to spurring, this had increased significantly since the prior exam and merit surveillance. There was a new 8 by 5 mm right upper lobe pulmonary nodule, Deauville 2.  CT guided right paraspinal lesion biopsyon 10/16/2017 was compatible with involvement by the patient's previously diagnosed lymphoma. The case was referred to [facility] hematopathology. The neoplastic cells were largely CD30 negative. Repeat CD30 immunohistochemistry faintly stained >10% of the large atypical cells. CD20 stained the scattered large atypical cells and few small cells.   She received 3 cycles of brentuximab vedotin(11/23/2017 - 01/11/2018).  PET scan on 01/29/2018 revealed persistent and progressive areas of hypermetabolism within the right submandibular gland, lingula of the left lung, left common iliac nodal chain, and right lumbar paraspinal soft tissues. Findings were compatible with Deauville criteria 4 and 5. There was a new nodule is identified within the lingula which demonstrated intense uptake compatible with Deauville criteria 5.   Chest, abdomen, and  pelvis CTon 05/14/2018 revealed enlarging mediastinal adenopathy and right upper lobe nodule, indicative of disease progression. Necrotic lingular nodules were grossly stable in size with developing surrounding collapse/consolidation, possibly treatment related. There was slight decrease in size of a left external iliac lymph node and right lumbar paraspinal soft tissue lesion. There was a tiny left pleural effusion.  Head MRIon 06/24/2018 revealed no evidence of intracranial metastasis.  She is s/p9cycles of pembrolizumab(06/14/2018 - 12/17/2018).  She received a steroid Dosepak on 01/09/2019 for minor chest CT changes felt secondary to pembrolizumab.  Chest, abdomen, and pelvis CT on 12/26/2018 revealed interval increase in dense consolidation of perihilar left lung and lingula and a dense bandlike perifissural consolidation of the inferior right upper lobe, in keeping with evolving post treatment change. There was an unchanged small left pleural effusion. There was new, diffusely scattered ground-glass opacity bilaterally, with and an unusual appearance and central clearing, most conspicuous in the right upper lobe. These findings were nonspecific and infectious or inflammatory, and particularly drug toxicity is a differential consideration. There was no change in enlarged mediastinal and AP window lymph nodes. There was no evidence of distant metastatic disease or lymphadenopathy in the abdomen or pelvis.  COVID-19 testing was negative on 01/09/2019.  Chest CT on 02/26/2019 revealed near resolution of diffuse bilateral ground-glass opacity with central predominance in the lungs previously has nearly resolved in the interval.  The collapse/consolidative change in the inferior right upper lobe and parahilar left lung was stable.  There were atable borderline enlarged prevascular lymph nodes.  She hasiron deficiency anemia. Ferritinhas been followed: 14 on 05/06/2018,23 on 07/26/2018, and  59 on 11/01/2018. She denies any bleeding.  She has  a grade I neuropathy. B12 and folate were normal on 12/06/2017. She is deafand requires an interpreter.  Symptomatically, she is doing well.  She denies any shortness of breath (unless wearing a mask).  Exam is unremarkable.  Plan: 1.   Labs today:CBC with diff, CMP, Mg, TSH. 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 She denies any B symptoms.  Exam reveals no adenopathy or hepatosplenomegaly.   Scans from 12/26/2018 revealed no evidence of progressive disease. She has been cleared to begin her next cycle of pembrolizumab from pulmonary medicine. Cycle #10pembrolizumab today. 3. Squamous cell lung cancer Clinically, she is doing well. She denies any respiratory symptoms. Scans from 12/26/2018 revealed no evidence of progressive lung cancer.       Restart pembrolizumab as noted above. 4. Ground glass opacities             Etiology felt secondary to grade I pembrolizumab related toxicity.             Completed a Solu-Medrol Dosepak.  She is currently on prednisone 10 mg a day.  Per Dr. Patsey Berthold, she is to taper prednisone to 10 mg every other day 5.   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 Continue to monitor. 6. B12 deficiency Patient on oral B12 500 mcg a day. B12 and folate were normal on 12/17/2018. 7.Knee pain Patient haskneepain unrelated to malignancy. 8.RTC in 3 weeks for MD assessment, labs (CBC with diff, CMP, LDH, TSH), and pembrolizumab.   I discussed the assessment and treatment plan with the patient.  The patient was provided an opportunity to ask questions and all were answered.  The patient agreed with the plan and demonstrated an understanding of the instructions.  The patient was advised to call back if the symptoms worsen or if the condition fails to improve as anticipated.   Lequita Asal, MD, PhD      02/27/2019, 11:22 AM  I, Selena Batten, am acting as scribe for [employer]. Mike Gip, MD, PhD.  I, Orell Hurtado C. Mike Gip, MD, have reviewed the above documentation for accuracy and completeness, and I agree with the above.

## 2019-02-26 ENCOUNTER — Other Ambulatory Visit: Payer: Self-pay

## 2019-02-26 ENCOUNTER — Ambulatory Visit
Admission: RE | Admit: 2019-02-26 | Discharge: 2019-02-26 | Disposition: A | Discharge status: Home / Self Care | Payer: Medicare HMO | Source: Ambulatory Visit | Attending: Pulmonary Disease | Admitting: Pulmonary Disease

## 2019-02-26 DIAGNOSIS — L0100 Impetigo, unspecified: Secondary | ICD-10-CM | POA: Diagnosis present

## 2019-02-26 DIAGNOSIS — R9389 Abnormal findings on diagnostic imaging of other specified body structures: Secondary | ICD-10-CM | POA: Insufficient documentation

## 2019-02-27 ENCOUNTER — Telehealth: Payer: Self-pay | Admitting: Pulmonary Disease

## 2019-02-27 ENCOUNTER — Encounter: Payer: Self-pay | Admitting: Hematology and Oncology

## 2019-02-27 ENCOUNTER — Inpatient Hospital Stay: Payer: Medicare HMO

## 2019-02-27 ENCOUNTER — Inpatient Hospital Stay: Payer: Medicare HMO | Attending: Hematology and Oncology | Admitting: Hematology and Oncology

## 2019-02-27 VITALS — BP 140/75 | HR 91 | Temp 97.3°F | Resp 18 | Ht 63.0 in | Wt 164.5 lb

## 2019-02-27 DIAGNOSIS — C8590 Non-Hodgkin lymphoma, unspecified, unspecified site: Secondary | ICD-10-CM

## 2019-02-27 DIAGNOSIS — K219 Gastro-esophageal reflux disease without esophagitis: Secondary | ICD-10-CM | POA: Diagnosis not present

## 2019-02-27 DIAGNOSIS — Z5112 Encounter for antineoplastic immunotherapy: Secondary | ICD-10-CM

## 2019-02-27 DIAGNOSIS — H9190 Unspecified hearing loss, unspecified ear: Secondary | ICD-10-CM | POA: Diagnosis not present

## 2019-02-27 DIAGNOSIS — C3490 Malignant neoplasm of unspecified part of unspecified bronchus or lung: Secondary | ICD-10-CM

## 2019-02-27 DIAGNOSIS — G620 Drug-induced polyneuropathy: Secondary | ICD-10-CM | POA: Diagnosis not present

## 2019-02-27 DIAGNOSIS — C8518 Unspecified B-cell lymphoma, lymph nodes of multiple sites: Secondary | ICD-10-CM | POA: Diagnosis not present

## 2019-02-27 DIAGNOSIS — E538 Deficiency of other specified B group vitamins: Secondary | ICD-10-CM | POA: Diagnosis not present

## 2019-02-27 DIAGNOSIS — M25562 Pain in left knee: Secondary | ICD-10-CM

## 2019-02-27 DIAGNOSIS — Z87891 Personal history of nicotine dependence: Secondary | ICD-10-CM | POA: Diagnosis not present

## 2019-02-27 DIAGNOSIS — Z79899 Other long term (current) drug therapy: Secondary | ICD-10-CM | POA: Diagnosis not present

## 2019-02-27 DIAGNOSIS — Z7982 Long term (current) use of aspirin: Secondary | ICD-10-CM

## 2019-02-27 DIAGNOSIS — Z791 Long term (current) use of non-steroidal anti-inflammatories (NSAID): Secondary | ICD-10-CM | POA: Insufficient documentation

## 2019-02-27 DIAGNOSIS — C801 Malignant (primary) neoplasm, unspecified: Secondary | ICD-10-CM

## 2019-02-27 DIAGNOSIS — G63 Polyneuropathy in diseases classified elsewhere: Secondary | ICD-10-CM

## 2019-02-27 LAB — COMPREHENSIVE METABOLIC PANEL
ALT: 23 U/L (ref 0–44)
AST: 26 U/L (ref 15–41)
Albumin: 3.1 g/dL — ABNORMAL LOW (ref 3.5–5.0)
Alkaline Phosphatase: 77 U/L (ref 38–126)
Anion gap: 9 (ref 5–15)
BUN: 24 mg/dL — ABNORMAL HIGH (ref 8–23)
CO2: 21 mmol/L — ABNORMAL LOW (ref 22–32)
Calcium: 8.8 mg/dL — ABNORMAL LOW (ref 8.9–10.3)
Chloride: 104 mmol/L (ref 98–111)
Creatinine, Ser: 0.97 mg/dL (ref 0.44–1.00)
GFR calc Af Amer: >60 mL/min (ref >60)
GFR calc non Af Amer: 57 mL/min — ABNORMAL LOW (ref >60)
Glucose, Bld: 154 mg/dL — ABNORMAL HIGH (ref 70–99)
Potassium: 3.5 mmol/L (ref 3.5–5.1)
Sodium: 134 mmol/L — ABNORMAL LOW (ref 135–145)
Total Bilirubin: 0.9 mg/dL (ref 0.3–1.2)
Total Protein: 7 g/dL (ref 6.5–8.1)

## 2019-02-27 LAB — CBC WITH DIFFERENTIAL/PLATELET
Abs Immature Granulocytes: 0.08 10*3/uL — ABNORMAL HIGH (ref 0.00–0.07)
Basophils Absolute: 0.1 10*3/uL (ref 0.0–0.1)
Basophils Relative: 1 %
Eosinophils Absolute: 0.3 10*3/uL (ref 0.0–0.5)
Eosinophils Relative: 3 %
HCT: 40 % (ref 36.0–46.0)
Hemoglobin: 13.5 g/dL (ref 12.0–15.0)
Immature Granulocytes: 1 %
Lymphocytes Relative: 11 %
Lymphs Abs: 1 10*3/uL (ref 0.7–4.0)
MCH: 33 pg (ref 26.0–34.0)
MCHC: 33.8 g/dL (ref 30.0–36.0)
MCV: 97.8 fL (ref 80.0–100.0)
Monocytes Absolute: 0.8 10*3/uL (ref 0.1–1.0)
Monocytes Relative: 8 %
Neutro Abs: 6.8 10*3/uL (ref 1.7–7.7)
Neutrophils Relative %: 76 %
Platelets: 341 10*3/uL (ref 150–400)
RBC: 4.09 MIL/uL (ref 3.87–5.11)
RDW: 15.6 % — ABNORMAL HIGH (ref 11.5–15.5)
WBC: 9 10*3/uL (ref 4.0–10.5)
nRBC: 0 % (ref 0.0–0.2)

## 2019-02-27 LAB — URIC ACID: Uric Acid, Serum: 3.8 mg/dL (ref 2.5–7.1)

## 2019-02-27 LAB — TSH: TSH: 2.687 u[IU]/mL (ref 0.350–4.500)

## 2019-02-27 LAB — MAGNESIUM: Magnesium: 2 mg/dL (ref 1.7–2.4)

## 2019-02-27 LAB — LACTATE DEHYDROGENASE: LDH: 140 U/L (ref 98–192)

## 2019-02-27 MED ORDER — SODIUM CHLORIDE 0.9 % IV SOLN
200.0000 mg | Freq: Once | INTRAVENOUS | Status: AC
  Administered 2019-02-27: 200 mg via INTRAVENOUS
  Filled 2019-02-27: qty 8

## 2019-02-27 MED ORDER — SODIUM CHLORIDE 0.9 % IV SOLN
Freq: Once | INTRAVENOUS | Status: AC
  Administered 2019-02-27: 12:00:00 via INTRAVENOUS
  Filled 2019-02-27: qty 250

## 2019-02-27 MED ORDER — HEPARIN SOD (PORK) LOCK FLUSH 100 UNIT/ML IV SOLN
500.0000 [IU] | Freq: Once | INTRAVENOUS | Status: AC
  Administered 2019-02-27: 500 [IU] via INTRAVENOUS
  Filled 2019-02-27: qty 5

## 2019-02-27 MED ORDER — ONDANSETRON HCL 4 MG PO TABS
8.0000 mg | ORAL_TABLET | Freq: Once | ORAL | Status: AC
  Administered 2019-02-27: 8 mg via ORAL
  Filled 2019-02-27: qty 2

## 2019-02-27 MED ORDER — SODIUM CHLORIDE 0.9% FLUSH
10.0000 mL | INTRAVENOUS | Status: AC | PRN
  Administered 2019-02-27: 10 mL via INTRAVENOUS
  Filled 2019-02-27: qty 10

## 2019-02-27 NOTE — Patient Instructions (Signed)
Pembrolizumab injection What is this medicine? PEMBROLIZUMAB (pem broe liz ue mab) is a monoclonal antibody. It is used to treat bladder cancer, cervical cancer, endometrial cancer, esophageal cancer, head and neck cancer, hepatocellular cancer, Hodgkin lymphoma, kidney cancer, lymphoma, melanoma, Merkel cell carcinoma, lung cancer, stomach cancer, urothelial cancer, and cancers that have a certain genetic condition. This medicine may be used for other purposes; ask your health care provider or pharmacist if you have questions. COMMON BRAND NAME(S): Keytruda What should I tell my health care provider before I take this medicine? They need to know if you have any of these conditions:  diabetes  immune system problems  inflammatory bowel disease  liver disease  lung or breathing disease  lupus  received or scheduled to receive an organ transplant or a stem-cell transplant that uses donor stem cells  an unusual or allergic reaction to pembrolizumab, other medicines, foods, dyes, or preservatives  pregnant or trying to get pregnant  breast-feeding How should I use this medicine? This medicine is for infusion into a vein. It is given by a health care professional in a hospital or clinic setting. A special MedGuide will be given to you before each treatment. Be sure to read this information carefully each time. Talk to your pediatrician regarding the use of this medicine in children. While this drug may be prescribed for selected conditions, precautions do apply. Overdosage: If you think you have taken too much of this medicine contact a poison control center or emergency room at once. NOTE: This medicine is only for you. Do not share this medicine with others. What if I miss a dose? It is important not to miss your dose. Call your doctor or health care professional if you are unable to keep an appointment. What may interact with this medicine? Interactions have not been studied. Give  your health care provider a list of all the medicines, herbs, non-prescription drugs, or dietary supplements you use. Also tell them if you smoke, drink alcohol, or use illegal drugs. Some items may interact with your medicine. This list may not describe all possible interactions. Give your health care provider a list of all the medicines, herbs, non-prescription drugs, or dietary supplements you use. Also tell them if you smoke, drink alcohol, or use illegal drugs. Some items may interact with your medicine. What should I watch for while using this medicine? Your condition will be monitored carefully while you are receiving this medicine. You may need blood work done while you are taking this medicine. Do not become pregnant while taking this medicine or for 4 months after stopping it. Women should inform their doctor if they wish to become pregnant or think they might be pregnant. There is a potential for serious side effects to an unborn child. Talk to your health care professional or pharmacist for more information. Do not breast-feed an infant while taking this medicine or for 4 months after the last dose. What side effects may I notice from receiving this medicine? Side effects that you should report to your doctor or health care professional as soon as possible:  allergic reactions like skin rash, itching or hives, swelling of the face, lips, or tongue  bloody or black, tarry  breathing problems  changes in vision  chest pain  chills  confusion  constipation  cough  diarrhea  dizziness or feeling faint or lightheaded  fast or irregular heartbeat  fever  flushing  hair loss  joint pain  low blood counts - this  medicine may decrease the number of white blood cells, red blood cells and platelets. You may be at increased risk for infections and bleeding.  muscle pain  muscle weakness  persistent headache  redness, blistering, peeling or loosening of the skin,  including inside the mouth  signs and symptoms of high blood sugar such as dizziness; dry mouth; dry skin; fruity breath; nausea; stomach pain; increased hunger or thirst; increased urination  signs and symptoms of kidney injury like trouble passing urine or change in the amount of urine  signs and symptoms of liver injury like dark urine, light-colored stools, loss of appetite, nausea, right upper belly pain, yellowing of the eyes or skin  sweating  swollen lymph nodes  weight loss Side effects that usually do not require medical attention (report to your doctor or health care professional if they continue or are bothersome):  decreased appetite  muscle pain  tiredness This list may not describe all possible side effects. Call your doctor for medical advice about side effects. You may report side effects to FDA at 1-800-FDA-1088. Where should I keep my medicine? This drug is given in a hospital or clinic and will not be stored at home. NOTE: This sheet is a summary. It may not cover all possible information. If you have questions about this medicine, talk to your doctor, pharmacist, or health care provider.  2020 [employer] Standard (2018-08-13 13:46:58)

## 2019-02-27 NOTE — Telephone Encounter (Signed)
Per Dr.Gonzalez DPR no need to follow up tomorrow and  She was instructed to have pt take Prednisone 10mg  QOD x 1week then decrease to 5mg  QOD x1w and then DC. DPR was advised that the pill she has is 20mg  so she would take 1/2 of that to equal 10mg  then she would need to break the pill into a quarter to get 5mg . Daughter repeated the instructions several times and verbalized understanding. Appointment canceled. No further actions necessary at this time.

## 2019-02-27 NOTE — Telephone Encounter (Signed)
addressed

## 2019-02-27 NOTE — Progress Notes (Signed)
Patient c/o left pain ( pain level 8) constant. The patient daughter reports the patient is follow by orthopedics.

## 2019-02-28 ENCOUNTER — Ambulatory Visit: Payer: Medicare HMO | Admitting: Pulmonary Disease

## 2019-03-05 ENCOUNTER — Ambulatory Visit: Payer: Medicare HMO | Admitting: Pulmonary Disease

## 2019-03-19 ENCOUNTER — Inpatient Hospital Stay: Payer: Medicare HMO | Attending: Oncology | Admitting: Oncology

## 2019-03-19 ENCOUNTER — Inpatient Hospital Stay: Payer: Medicare HMO

## 2019-03-19 ENCOUNTER — Other Ambulatory Visit: Payer: Self-pay

## 2019-03-19 ENCOUNTER — Other Ambulatory Visit: Payer: Self-pay | Admitting: Hematology and Oncology

## 2019-03-19 VITALS — BP 109/67 | HR 79 | Resp 16

## 2019-03-19 VITALS — BP 132/68 | HR 85 | Temp 97.4°F | Resp 16 | Wt 170.9 lb

## 2019-03-19 DIAGNOSIS — C3490 Malignant neoplasm of unspecified part of unspecified bronchus or lung: Secondary | ICD-10-CM

## 2019-03-19 DIAGNOSIS — Z833 Family history of diabetes mellitus: Secondary | ICD-10-CM | POA: Diagnosis not present

## 2019-03-19 DIAGNOSIS — C8518 Unspecified B-cell lymphoma, lymph nodes of multiple sites: Secondary | ICD-10-CM

## 2019-03-19 DIAGNOSIS — Z806 Family history of leukemia: Secondary | ICD-10-CM | POA: Insufficient documentation

## 2019-03-19 DIAGNOSIS — F17210 Nicotine dependence, cigarettes, uncomplicated: Secondary | ICD-10-CM | POA: Diagnosis not present

## 2019-03-19 DIAGNOSIS — C8170 Other classical Hodgkin lymphoma, unspecified site: Secondary | ICD-10-CM | POA: Diagnosis not present

## 2019-03-19 DIAGNOSIS — Z5112 Encounter for antineoplastic immunotherapy: Secondary | ICD-10-CM

## 2019-03-19 DIAGNOSIS — T451X5A Adverse effect of antineoplastic and immunosuppressive drugs, initial encounter: Secondary | ICD-10-CM | POA: Diagnosis not present

## 2019-03-19 DIAGNOSIS — Z801 Family history of malignant neoplasm of trachea, bronchus and lung: Secondary | ICD-10-CM | POA: Diagnosis not present

## 2019-03-19 DIAGNOSIS — M25569 Pain in unspecified knee: Secondary | ICD-10-CM | POA: Diagnosis not present

## 2019-03-19 DIAGNOSIS — E538 Deficiency of other specified B group vitamins: Secondary | ICD-10-CM | POA: Diagnosis not present

## 2019-03-19 DIAGNOSIS — G620 Drug-induced polyneuropathy: Secondary | ICD-10-CM | POA: Diagnosis not present

## 2019-03-19 DIAGNOSIS — J90 Pleural effusion, not elsewhere classified: Secondary | ICD-10-CM | POA: Insufficient documentation

## 2019-03-19 DIAGNOSIS — C8590 Non-Hodgkin lymphoma, unspecified, unspecified site: Secondary | ICD-10-CM

## 2019-03-19 DIAGNOSIS — C3432 Malignant neoplasm of lower lobe, left bronchus or lung: Secondary | ICD-10-CM | POA: Insufficient documentation

## 2019-03-19 DIAGNOSIS — Z79899 Other long term (current) drug therapy: Secondary | ICD-10-CM | POA: Insufficient documentation

## 2019-03-19 LAB — COMPREHENSIVE METABOLIC PANEL
ALT: 24 U/L (ref 0–44)
AST: 23 U/L (ref 15–41)
Albumin: 3.3 g/dL — ABNORMAL LOW (ref 3.5–5.0)
Alkaline Phosphatase: 70 U/L (ref 38–126)
Anion gap: 9 (ref 5–15)
BUN: 24 mg/dL — ABNORMAL HIGH (ref 8–23)
CO2: 23 mmol/L (ref 22–32)
Calcium: 9 mg/dL (ref 8.9–10.3)
Chloride: 103 mmol/L (ref 98–111)
Creatinine, Ser: 0.88 mg/dL (ref 0.44–1.00)
GFR calc Af Amer: >60 mL/min (ref >60)
GFR calc non Af Amer: >60 mL/min (ref >60)
Glucose, Bld: 121 mg/dL — ABNORMAL HIGH (ref 70–99)
Potassium: 3.9 mmol/L (ref 3.5–5.1)
Sodium: 135 mmol/L (ref 135–145)
Total Bilirubin: 1.1 mg/dL (ref 0.3–1.2)
Total Protein: 7.2 g/dL (ref 6.5–8.1)

## 2019-03-19 LAB — CBC WITH DIFFERENTIAL/PLATELET
Abs Immature Granulocytes: 0.07 10*3/uL (ref 0.00–0.07)
Basophils Absolute: 0.1 10*3/uL (ref 0.0–0.1)
Basophils Relative: 1 %
Eosinophils Absolute: 0.3 10*3/uL (ref 0.0–0.5)
Eosinophils Relative: 3 %
HCT: 40.1 % (ref 36.0–46.0)
Hemoglobin: 13.7 g/dL (ref 12.0–15.0)
Immature Granulocytes: 1 %
Lymphocytes Relative: 17 %
Lymphs Abs: 1.5 10*3/uL (ref 0.7–4.0)
MCH: 32.6 pg (ref 26.0–34.0)
MCHC: 34.2 g/dL (ref 30.0–36.0)
MCV: 95.5 fL (ref 80.0–100.0)
Monocytes Absolute: 1.1 10*3/uL — ABNORMAL HIGH (ref 0.1–1.0)
Monocytes Relative: 12 %
Neutro Abs: 5.9 10*3/uL (ref 1.7–7.7)
Neutrophils Relative %: 66 %
Platelets: 290 10*3/uL (ref 150–400)
RBC: 4.2 MIL/uL (ref 3.87–5.11)
RDW: 15.9 % — ABNORMAL HIGH (ref 11.5–15.5)
WBC: 8.9 10*3/uL (ref 4.0–10.5)
nRBC: 0 % (ref 0.0–0.2)

## 2019-03-19 LAB — LACTATE DEHYDROGENASE: LDH: 138 U/L (ref 98–192)

## 2019-03-19 LAB — URIC ACID: Uric Acid, Serum: 3.5 mg/dL (ref 2.5–7.1)

## 2019-03-19 LAB — TSH: TSH: 1.83 u[IU]/mL (ref 0.350–4.500)

## 2019-03-19 MED ORDER — HEPARIN SOD (PORK) LOCK FLUSH 100 UNIT/ML IV SOLN
500.0000 [IU] | Freq: Once | INTRAVENOUS | Status: AC
  Administered 2019-03-19: 500 [IU] via INTRAVENOUS

## 2019-03-19 MED ORDER — ONDANSETRON HCL 4 MG PO TABS
8.0000 mg | ORAL_TABLET | Freq: Once | ORAL | Status: AC
  Administered 2019-03-19: 8 mg via ORAL
  Filled 2019-03-19: qty 2

## 2019-03-19 MED ORDER — SODIUM CHLORIDE 0.9 % IV SOLN
Freq: Once | INTRAVENOUS | Status: AC
  Administered 2019-03-19: 15:00:00 via INTRAVENOUS
  Filled 2019-03-19: qty 250

## 2019-03-19 MED ORDER — SODIUM CHLORIDE 0.9% FLUSH
10.0000 mL | INTRAVENOUS | Status: DC | PRN
  Administered 2019-03-19: 10 mL via INTRAVENOUS
  Filled 2019-03-19: qty 10

## 2019-03-19 MED ORDER — SODIUM CHLORIDE 0.9 % IV SOLN
200.0000 mg | Freq: Once | INTRAVENOUS | Status: AC
  Administered 2019-03-19: 200 mg via INTRAVENOUS
  Filled 2019-03-19: qty 8

## 2019-03-19 NOTE — Progress Notes (Signed)
Patient here for follow up and next treatment.  No complaints other than her knees are hurting.

## 2019-03-19 NOTE — Progress Notes (Signed)
°[facility] [place] [facility]  °[address], [address] °[place], [place] [address] °Phone: [phone] ° Fax: [phone] ° ° °Clinic Day:  03/19/2019 ° °Referring physician: Wilson, Alex M, DO ° °Chief Complaint: Toni Griffith is a 78 y.o. 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 ° °HPI: The patient was last seen in the medical oncology clinic on 02/27/19. At that time, she admitted to feeling well.  She noted a bruise on her arm.  Her breathing had continued to improve.  She continued to have knee pain.  Weight decreased 6 pounds.  She received cycle 10 of Keytruda.  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 ° °The patient had a follow up with Dr. Gonzalez on 01/29/2019 and 02/14/2019. She has been cleared to do treatment again. She has weaned off 10 mg prednisone daily. ° °Chest CT on 02/26/2019 revealed near resolution of diffuse bilateral ground-glass opacity with central predominance in the lungs previously has nearly resolved in the interval.  The collapse/consolidative change in the inferior right upper lobe and parahilar left lung was stable.  There were atable borderline enlarged prevascular lymph nodes. ° °During the interim, the patient is feeling well.  Her breathing is improved and she has finished her prednisone taper.  Daughter notes worsening bilateral knee pain and swelling.  She is concerned that this is getting worse since beginning Keytruda.  She received a steroid injection to left knee.  She noted some improvement.  She is scheduled for follow-up next week with Dr. Felder orthopedics.  Per Dr. Felder's note, bilateral knee pain was likely due to osteoarthritis.  ° °Her appetite is good and her weight is up 6 pounds since last visit. ° °Past Medical History:  °Diagnosis Date  °• Arthritis   °• Cancer (HCC)   ° lymphoma-right hip    °• Deaf   °• GERD (gastroesophageal reflux disease)   °• Hypertension   ° NOT ON MED AT PRESENT  ° ° °Past Surgical History:  °Procedure Laterality Date  °• CARDIAC SURGERY  1993  °• CORONARY ARTERY BYPASS GRAFT    ° DOUBLE  °• CYST REMOVAL HAND Right   °• ENDOBRONCHIAL ULTRASOUND N/A 05/30/2018  ° Procedure: ENDOBRONCHIAL ULTRASOUND;  Surgeon: Gonzalez, Carmen L, MD;  Location: [facility] ORS;  Service: Cardiopulmonary;  Laterality: N/A;  °• PERIPHERAL VASCULAR CATHETERIZATION N/A 04/06/2016  ° Procedure: Porta Cath Insertion;  Surgeon: Jason S Dew, MD;  Location: [facility] INVASIVE CV LAB;  Service: Cardiovascular;  Laterality: N/A;  ° ° °Family History  °Problem Relation Age of Onset  °• Lung cancer Mother   °• Lung cancer Father   °• Diabetes Maternal Uncle   °• Myasthenia gravis Maternal Grandmother   °• Leukemia Maternal Grandfather   ° ° °Social History:  reports that she quit smoking about 3 years ago. Her smoking use included cigarettes. She has a 80.00 pack-year smoking history. She has never used smokeless tobacco. She reports current alcohol use. She reports that she does not use drugs.  She smoked 1 1/2 packs per day for 20 years (30 pack year smoking history) until 10/2015. She is hearing impaired (deaf).  She lives in [place] with her boyfriend. Her daughter lives in [place]. Her daughter, [name]'s contact number is [phone]. She is living with her son. The patient is accompanied by her daughter (over the phone), the interpreter, and Dr.   Dr. Patsey Berthold (over the phone) today.  Allergies: No Known Allergies  Current Medications: Current Outpatient Medications  Medication Sig Dispense Refill   acetaminophen (TYLENOL) 500 MG tablet Take 500 mg by mouth every 6 (six) hours as needed for moderate pain.      allopurinol (ZYLOPRIM) 300 MG tablet TAKE 1 TABLET BY MOUTH EVERY DAY 90 tablet 0   aspirin EC 81 MG tablet Take 81 mg by mouth daily.     atorvastatin (LIPITOR) 80 MG tablet Take 80 mg by mouth  every evening.      b complex vitamins tablet Take 1 tablet by mouth daily.     Cholecalciferol (VITAMIN D3) 2000 units capsule Take 2,000 Units by mouth daily.     diclofenac sodium (VOLTAREN) 1 % GEL APPLY 2 G TOPICALLY 3 (THREE) TIMES DAILY AS NEEDED (LEFT KNEE). 100 g 1   DULoxetine (CYMBALTA) 20 MG capsule Take 10 mg by mouth.      ferrous sulfate 325 (65 FE) MG tablet Take 650 mg by mouth daily with breakfast.     FLUoxetine (PROZAC) 10 MG tablet Take 10 mg by mouth daily.     gabapentin (NEURONTIN) 100 MG capsule Take 2 capsules (200 mg total) by mouth at bedtime. 60 capsule 3   HYDROcodone-acetaminophen (NORCO) 5-325 MG tablet Take 1 tablet by mouth every 6 (six) hours as needed for moderate pain. 30 tablet 0   lidocaine-prilocaine (EMLA) cream Apply cream 1 hour before chemotherapy treatment and place small peive of saran wrap over cream to protect clothing 30 g 3   Magnesium 250 MG TABS Take 1 tablet by mouth daily.     ondansetron (ZOFRAN ODT) 4 MG disintegrating tablet Take 1 tablet (4 mg total) by mouth every 8 (eight) hours as needed for nausea or vomiting. (Patient not taking: Reported on 02/27/2019) 30 tablet 1   pantoprazole (PROTONIX) 40 MG tablet Take 40 mg by mouth daily.      predniSONE (DELTASONE) 20 MG tablet Take 1 tablet (20 mg total) by mouth daily. Take with largest meal of day. 30 tablet 2   predniSONE (DELTASONE) 20 MG tablet Take 20 mg by mouth daily with breakfast.     vitamin B-12 (CYANOCOBALAMIN) 500 MCG tablet Take 500 mcg by mouth daily.     vitamin E 400 UNIT capsule Take 400 Units by mouth daily.      No current facility-administered medications for this visit.    Facility-Administered Medications Ordered in Other Visits  Medication Dose Route Frequency Provider Last Rate Last Dose   heparin lock flush 100 unit/mL  500 Units Intravenous Once Corcoran, Melissa C, MD       heparin lock flush 100 unit/mL  500 Units Intravenous Once Faythe Casa E, NP       sodium chloride flush (NS) 0.9 % injection 10 mL  10 mL Intravenous PRN Nolon Stalls C, MD   10 mL at 04/05/17 1034   sodium chloride flush (NS) 0.9 % injection 10 mL  10 mL Intravenous PRN Nolon Stalls C, MD   10 mL at 02/27/19 1033   sodium chloride flush (NS) 0.9 % injection 10 mL  10 mL Intravenous PRN Jacquelin Hawking, NP        Review of Systems  Constitutional: Positive for malaise/fatigue.  HENT: Positive for hearing loss.   Eyes: Negative.   Respiratory: Negative.   Cardiovascular: Negative.   Gastrointestinal: Negative.   Genitourinary: Negative.   Musculoskeletal: Positive for joint pain.  Knee swelling and pain  °Skin: Negative.   °Neurological: Positive for weakness.  °Endo/Heme/Allergies: Negative.   °Psychiatric/Behavioral: Negative.   ° °Performance status (ECOG): 2 ° °Vitals °Blood pressure 132/68, pulse 85, temperature (!) 97.4 °F (36.3 °C), temperature source Tympanic, resp. rate 16, weight 170 lb 14.4 oz (77.5 kg), SpO2 96 %.  ° °Physical Exam  °Constitutional: She is oriented to person, place, and time. Vital signs are normal. She appears well-developed and well-nourished.  °HENT:  °Head: Normocephalic and atraumatic.  °Eyes: Pupils are equal, round, and reactive to light.  °Neck: Normal range of motion.  °Cardiovascular: Normal rate and regular rhythm.  °No murmur heard. °Pulmonary/Chest: Effort normal and breath sounds normal. She has no wheezes.  °Abdominal: Soft. Bowel sounds are normal. She exhibits no distension and no mass. There is no abdominal tenderness.  °Musculoskeletal: Normal range of motion.     °   General: Tenderness (Bilateral knees) and edema present.  °Neurological: She is alert and oriented to person, place, and time.  °Skin: Skin is warm and dry.  °Psychiatric: She has a normal mood and affect. Her behavior is normal.  ° ° °Admissions: °02/20/2016 - 02/26/2016:  [facility] in [place]:  She had renal failure and  hypercalcemia. She was treated with IVF, calcitonin and Zometa.The patient has a history of coronary artery disease s/p CABG in 1993.  She has no history of heart failure.  °01/20/2018 - 01/23/2018:  [facility] [place].  She presented with nausea, vomiting, diarrhea and generalized weakness. Work-up showed hypokalemia, hypomagnesemia, dehydration and UTI.  She was treated with IV fluids and empiric IV antibiotics. Potassium and magnesium were repleted. Urine culture showed E. coli.  She was discharged on Augmentin. She was switched from HCTZ to chorthalidone. °02/01/2018 - 02/03/2018:  [facility] with hyponatremia. °  °  °Radiology studies: °02/20/2016:  Chest, abdomen and pelvis CT revealed a 3.7 cm subpleural mass within the left lower lobe, a 1.1 cm right lower lobe subpleural nodule , 9 mm RUL nodule, and 0.4 cm lingular nodule.  Between the left second, third, and fourth ribs there was 1.9 cm soft tissue mass. There was a 2.5 cm low-attenuation nodule in the left lobe of the thyroid.   There were prominent left axillary nodes (up to 1.0 cm).   There was no definite mediastinal adenopathy. There was a 5.5 x 4.6 cm cystic lesion in the left hepatic lobe.  There was a 3.1 cm nodule in the right adrenal gland. There was a 1.2 cm right retrocaval lymph node with additional prominent subcentimeter retroperitoneal nodes. There was 1.0 cm left common iliac node. There was a 6.2 x 5.2 cm soft tissue mass within the right ilium.  °02/21/2016:  Head MRI revealed no evidence of malignancy.  °02/21/2016:  Thyroid ultrasound revealed a 2.4 cm solid nodule in the inferior aspect of the left lobe °03/03/2016:  PET scan revealed a hypermetabolic left lower lobe mass, bilateral pleural and pulmonary parenchymal nodular metastasis, a large pleural lesion invading the anterior left chest wall.   There was activity in the superior left ocular orbit.  There was a large lesion centered in the right iliac wing  with soft tissue extension.  There were hypermetabolic right external iliac lymph nodes °06/06/2016:  PET scan revealed significant interval response to therapy.  There was significant decrease in size and FDG uptake associated with left lower lobe lung lesion.  There was resolution of left-sided pleural base metastasis and left axillary and anterior mediastinal nodal metastasis.  There was resolution of previous hypermetabolic soft tissue metastasis/nodal metastasis posterior to the IVC.  There was marked improvement in hypermetabolic metastasis involving the right iliac wing.  There   was persistent right upper lobe lung nodule which exhibits intense radiotracer uptake which may represent a focus of metastatic disease or synchronous primary lung neoplasm. °06/01/2017:  PET scan revealed no new or progressive findings.  The 9 mm RUL pulmonary nodule remained hypermetabolic (SUV 10.6, previously 7.3) and no change in size.  The index lesion in the posterior left lower lobe was minimally smaller on CT imaging with very low level FDG uptake (SUV 1.6).  There was no change in the appearance at site of previous disease at the right iliac wing (SUV 1.9 compared to 2.6 previously). °01/29/2017:  PET scan revealed interval progression of hypermetabolic nodules in the right upper (13 mm compared to 9 mm; SUV 10) and left lower lobes (26 mm compared to 22 mm; SUV 7.5).  There was new hypermetabolic focus of activity along the anterior left pleura although no underlying pleural or lung mass was evident. °09/18/2017:  PET scan revealed a mixed appearance, with marked improvement in the prior 2 pulmonary nodules; but with new Deauville 5 left common iliac lymph node enlargement; new Deauville 5 right paraspinal lesion; and new Deauville 4 left infrahilar nodal activity.  There was a focus of accentuated metabolic activity which had been present along the right lower submandibular gland but currently seemed to project over the  subcutaneous tissues without CT correlate. Current maximum SUV 10.5, previously 9.7.  There was increased accentuated activity along the left lateral spurring at L2-3. While typically such activity is inflammatory due to spurring, this had increased significantly since the prior exam and merit surveillance. There was a new 8 by 5 mm right upper lobe pulmonary nodule, Deauville 2, which also merits surveillance. °01/29/2018:  PET scan revealed persistent and progressive areas of hypermetabolism within the right submandibular gland, lingula of the left lung, left common iliac nodal chain, and right lumbar paraspinal soft tissues. Findings were compatible with Deauville criteria 4 and 5.  There was a new nodule is identified within the lingula which demonstrated intense uptake compatible with Deauville criteria 5.  °05/14/2018:  Chest, abdomen, and pelvis CT revealed enlarging mediastinal adenopathy and right upper lobe nodule, indicative of disease progression. Necrotic lingular nodules were grossly stable in size with developing surrounding collapse/consolidation, possibly treatment related.  There was slight decrease in size of a left external iliac lymph node and right lumbar paraspinal soft tissue lesion.  There was a tiny left pleural effusion. °06/24/2018:  Head MRI revealed no evidence of intracranial metastasis. °09/03/2018:  Chest, abdomen, and pelvis CT revealed interval decrease in size of previous index right paratracheal and left hilar lymph nodes. Left-sided pre-vascular lymph nodes have also decreased in size in the interval. There was a high left paratracheal lymph node however which has increased in size (0.6 cm to 1.1 cm).  There was progressive masslike architectural distortion within the perihilar left lung and lingula. There were surrounding areas of fibrosis and ground-glass attenuation.  These findings may reflect progressive post treatment changes. In the absence of interval treatment  progressive neoplasm can not be excluded.  There was no residual or recurrent adenopathy within the abdomen or pelvis.  The rright paraspinal soft tissue nodule was not confidently visualized.  She has a hiatal hernia and aortic atherosclerosis.  °12/26/2018:  Chest, abdomen, and pelvis CT revealed interval increase in dense consolidation of perihilar left lung and lingula and a dense bandlike perifissural consolidation of the inferior right upper lobe, in keeping with evolving post treatment change. There was an unchanged small left pleural effusion. There was new, diffusely scattered ground-glass opacity bilaterally, with and unusual atoll like appearance and central clearing, most conspicuous in the right upper lobe. These findings   were nonspecific and infectious or inflammatory, and particularly drug toxicity is a differential consideration. There was no change in enlarged mediastinal and AP window lymph nodes. There was no evidence of distant metastatic disease or lymphadenopathy in the abdomen or pelvis. °02/26/2019:  Chest CT revealed near resolution of diffuse bilateral ground-glass opacity with central predominance in the lungs previously has nearly resolved in the interval.  The collapse/consolidative change in the inferior right upper lobe and parahilar left lung was stable.  There were atable borderline enlarged prevascular lymph nodes. ° ° °Infusion on 03/19/2019  °Component Date Value Ref Range Status  °• Uric Acid, Serum 03/19/2019 3.5  2.5 - 7.1 mg/dL Final  ° Performed at [facility], [address] [place], [place] [address]  °• LDH 03/19/2019 138  98 - 192 U/L Final  ° Performed at [place], [address] [place], [place] [address]  °• Sodium 03/19/2019 135  135 - 145 mmol/L Final  °• Potassium 03/19/2019 3.9  3.5 - 5.1 mmol/L Final  °• Chloride 03/19/2019 103  98 - 111 mmol/L Final  °• CO2 03/19/2019 23  22 - 32 mmol/L Final  °• Glucose, Bld 03/19/2019 121* 70 -  99 mg/dL Final  °• BUN 03/19/2019 24* 8 - 23 mg/dL Final  °• Creatinine, Ser 03/19/2019 0.88  0.44 - 1.00 mg/dL Final  °• Calcium 03/19/2019 9.0  8.9 - 10.3 mg/dL Final  °• Total Protein 03/19/2019 7.2  6.5 - 8.1 g/dL Final  °• Albumin 03/19/2019 3.3* 3.5 - 5.0 g/dL Final  °• AST 03/19/2019 23  15 - 41 U/L Final  °• ALT 03/19/2019 24  0 - 44 U/L Final  °• Alkaline Phosphatase 03/19/2019 70  38 - 126 U/L Final  °• Total Bilirubin 03/19/2019 1.1  0.3 - 1.2 mg/dL Final  °• GFR calc non Af Amer 03/19/2019 >60  >60 mL/min Final  °• GFR calc Af Amer 03/19/2019 >60  >60 mL/min Final  °• Anion gap 03/19/2019 9  5 - 15 Final  ° Performed at [place] [facility], [address] [place], [place] [address]  °• WBC 03/19/2019 8.9  4.0 - 10.5 K/uL Final  °• RBC 03/19/2019 4.20  3.87 - 5.11 MIL/uL Final  °• Hemoglobin 03/19/2019 13.7  12.0 - 15.0 g/dL Final  °• HCT 03/19/2019 40.1  36.0 - 46.0 % Final  °• MCV 03/19/2019 95.5  80.0 - 100.0 fL Final  °• MCH 03/19/2019 32.6  26.0 - 34.0 pg Final  °• MCHC 03/19/2019 34.2  30.0 - 36.0 g/dL Final  °• RDW 03/19/2019 15.9* 11.5 - 15.5 % Final  °• Platelets 03/19/2019 290  150 - 400 K/uL Final  °• nRBC 03/19/2019 0.0  0.0 - 0.2 % Final  °• Neutrophils Relative % 03/19/2019 66  % Final  °• Neutro Abs 03/19/2019 5.9  1.7 - 7.7 K/uL Final  °• Lymphocytes Relative 03/19/2019 17  % Final  °• Lymphs Abs 03/19/2019 1.5  0.7 - 4.0 K/uL Final  °• Monocytes Relative 03/19/2019 12  % Final  °• Monocytes Absolute 03/19/2019 1.1* 0.1 - 1.0 K/uL Final  °• Eosinophils Relative 03/19/2019 3  % Final  °• Eosinophils Absolute 03/19/2019 0.3  0.0 - 0.5 K/uL Final  °• Basophils Relative 03/19/2019 1  % Final  °• Basophils Absolute 03/19/2019 0.1  0.0 - 0.1 K/uL Final  °• Immature Granulocytes 03/19/2019 1  % Final  °• Abs Immature Granulocytes 03/19/2019 0.07  0.00 - 0.07 K/uL   Final  ° Performed at [place], [address] [place], [place] [address]  ° ° °Assessment:  Joanmarie Rickard  Yeh is a 78 y.o. female with stage IVB B-cell lymphoma, unclassifiable, with features intermediate between diffuse large B-cell lymphoma and classical Hodgkin's lymphoma ("gray zone" lymphoma).  She underwent right iliac wing bone/bone marrow biopsy on 03/08/2016. °  °PET scan on 03/03/2016 revealed a hypermetabolic left lower lobe mass, bilateral pleural and pulmonary parenchymal nodular metastasis, a large pleural lesion invading the anterior left chest wall.   There was activity in the superior left ocular orbit.  There was a large lesion centered in the right iliac wing with soft tissue extension.  There were hypermetabolic right external iliac lymph nodes °  °Echo on 04/04/2016 revealed an EF of 55-60%.  Hepatitis B and C testing was negative on 03/30/2016.   G6PD assay was normal.  She declined LP with MTX prophylaxis. °  °She received 6 cycles of mini-RCHOP (04/07/2016 - 07/21/2016).  She has had some transient fingertip numbness. °  °She received radiation to the right iliac wing,  from 10/11/2016 - 11/01/2016.  She received 3000 cGy over 3 weeks. °  °PET scan on 01/29/2017 revealed interval progression of hypermetabolic nodules in the right upper (13 mm compared to 9 mm; SUV 10) and left lower lobes (26 mm compared to 22 mm; SUV 7.5).  There was new hypermetabolic focus of activity along the anterior left pleura although no underlying pleural or lung mass was evident. °  °She has squamous cell lung cancer.  Foundation One on the lung biopsy on 03/15/2018 revealed MS-stable, tumor mutational burden 5 Muts/Mb, AKT2 amplification, CCNE1 amplification, CREBBP [id number], KDR amplification, KIT amplification, PDGFRA amplification, PTEN loss exons 2-4, and TP53 splice site 782+1G>A. .  There were no alterations in EGFR, KRAS, RET, ALK, MET, ERBB2, BRAF, ROS1.  PDL-1 IHC analysis revealed a TPS score of 50%. °  °CT guided left lower lobe nodule biopsy on 02/19/2017 confirmed squamous cell carcinoma.  She underwent  SBRT 03/26/2017 - 04/09/2017.  She received 5000 cGy in 5 fractions to the left lower lobe lesion.  She underwent SBRT to a RUL lesion from 05/23/2017 - 06/18/2017. °  °CT guided left upper lobe nodule biopsy on 02/20/2018 revealed squamous cell lung cancer.  She received SBRT from 04/09/2018 - 04/15/2018.   She received 6000 cGy in 5 fractions.  °  °Bronchoscopy on 05/30/2018 revealed no endobronchial lesions.  The 2 cm pre-carinal lymph node was biopsied.  Pathology revealed metastatic non-small cell carcinoma, favor squamous cell carcinoma. °  °PET scan on 09/18/2017 revealed a mixed appearance, with marked improvement in the prior 2 pulmonary nodules; but with new Deauville 5 left common iliac lymph node enlargement; new Deauville 5 right paraspinal lesion; and new Deauville 4 left infrahilar nodal activity.  There was a focus of accentuated metabolic activity which had been present along the right lower submandibular gland but currently seemed to project over the subcutaneous tissues without CT correlate. Current maximum SUV 10.5, previously 9.7. There was increased accentuated activity along the left lateral spurring at L2-3. While typically such activity is inflammatory due to spurring, this had increased significantly since the prior exam and merit surveillance. There was a new 8 by 5 mm right upper lobe pulmonary nodule, Deauville 2. °  °CT guided right paraspinal lesion biopsy on 10/16/2017 was compatible with involvement by the patient's previously diagnosed lymphoma.  The case was referred to [facility] hematopathology.  The neoplastic cells were largely CD30 negative.  Repeat CD30 immunohistochemistry faintly stained > 10% of the large atypical cells. CD20 stained the scattered large atypical cells and few small cells.  °  °She received 3 cycles of brentuximab vedotin (11/23/2017 - 01/11/2018). °  °PET scan on 01/29/2018 revealed persistent and progressive areas of hypermetabolism within the   right  submandibular gland, lingula of the left lung, left common iliac nodal chain, and right lumbar paraspinal soft tissues. Findings were compatible with Deauville criteria 4 and 5.  There was a new nodule is identified within the lingula which demonstrated intense uptake compatible with Deauville criteria 5.  °  °Chest, abdomen, and pelvis CT on 05/14/2018 revealed enlarging mediastinal adenopathy and right upper lobe nodule, indicative of disease progression. Necrotic lingular nodules were grossly stable in size with developing surrounding collapse/consolidation, possibly treatment related.  There was slight decrease in size of a left external iliac lymph node and right lumbar paraspinal soft tissue lesion.  There was a tiny left pleural effusion. °  °Head MRI on 06/24/2018 revealed no evidence of intracranial metastasis. °  °She is s/p 9 cycles of pembrolizumab (06/14/2018  - 12/17/2018).  She received a steroid Dosepak on 01/09/2019 for minor chest CT changes felt secondary to pembrolizumab. °  °Chest, abdomen, and pelvis CT on 12/26/2018 revealed interval increase in dense consolidation of perihilar left lung and lingula and a dense bandlike perifissural consolidation of the inferior right upper lobe, in keeping with evolving post treatment change. There was an unchanged small left pleural effusion. There was new, diffusely scattered ground-glass opacity bilaterally, with and an unusual appearance and central clearing, most conspicuous in the right upper lobe. These findings were nonspecific and infectious or inflammatory, and particularly drug toxicity is a differential consideration. There was no change in enlarged mediastinal and AP window lymph nodes. There was no evidence of distant metastatic disease or lymphadenopathy in the abdomen or pelvis.  COVID-19 testing was negative on 01/09/2019. °  °Chest CT on 02/26/2019 revealed near resolution of diffuse bilateral ground-glass opacity with central predominance  in the lungs previously has nearly resolved in the interval.  The collapse/consolidative change in the inferior right upper lobe and parahilar left lung was stable.  There were atable borderline enlarged prevascular lymph nodes. ° °She has iron deficiency anemia.  Ferritin has been followed:  14 on 05/06/2018, 23 on 07/26/2018, and 59 on 11/01/2018.  She denies any bleeding. °  °She has a grade I neuropathy.  B12 and folate were normal on 12/06/2017.  She is deaf and requires an interpreter. ° °Symptomatically, she continues to do well.  Notes worsening bilateral knee swelling and pain.  Affecting her ADLs.  Otherwise exam is unremarkable. °Plan: °1.   Labs today:  CBC with diff, CMP, TSH, Uric Acid, LDH °2.   Gray zone lymphoma °Clinically she is doing well. °She denies any B symptoms.  Exam reveals no adenopathy or hepatosplenomegaly.   °Scans from 12/26/2018 revealed no evidence of progressive disease. °She has been cleared to begin her next cycle of pembrolizumab from pulmonary medicine. °Cycle #11 pembrolizumab today. °3.   Squamous cell lung cancer °      Clinically, she is doing well. °      She denies any respiratory symptoms. °      Scans from 12/26/2018 revealed no evidence of progressive lung cancer. °      Pembrolizumab as noted above.        °4.   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 °            Completed a Solu-Medrol Dosepak. ° Recently tapered off prednisone 10 mg a day per Dr. Gonzalez's guidance.  °5.   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 °Continue to monitor. °6.   B12 deficiency °Patient on oral B12 500 mcg a day.  °B12 and folate were normal on 12/17/2018. °7.   Knee pain °            Patient has knee pain unrelated to malignancy. ° Patient to be evaluated by Dr. Felder next week. ° Has received an steroid injection with some relief to left knee. ° Per   up-to-date 19 -32% of patients experience  musculoskeletal pain, arthralgias 10 -18%, myalgias 12% and  arthritis 2% while receiving Keytruda. ° Patient's daughter has concerns that Keytruda may be exacerbating Mrs. Cobos's knee discomfort.  Would like  to proceed today with treatment, but would like to speak with Dr. Corcoran in more detail prior to next infusion. °8.   RTC in 3 weeks for MD assessment, labs (CBC with diff, CMP, LDH, TSH), and pembrolizumab.  ° °I discussed the assessment and treatment plan with the patient.  The patient was provided an opportunity to ask questions and all were answered.  The patient agreed with the plan and demonstrated an understanding of the instructions.  The patient was advised to call back if the symptoms worsen or if the condition fails to improve as anticipated. ° °Greater than 50% was spent in counseling and coordination of care with this patient including but not limited to discussion of the relevant topics above (See A&P) including, but not limited to diagnosis and management of acute and chronic medical conditions.  ° °Jennifer Burns, NP °03/20/2019 11:13 AM ° °

## 2019-03-19 NOTE — Patient Instructions (Signed)
Pembrolizumab injection What is this medicine? PEMBROLIZUMAB (pem broe liz ue mab) is a monoclonal antibody. It is used to treat bladder cancer, cervical cancer, endometrial cancer, esophageal cancer, head and neck cancer, hepatocellular cancer, Hodgkin lymphoma, kidney cancer, lymphoma, melanoma, Merkel cell carcinoma, lung cancer, stomach cancer, urothelial cancer, and cancers that have a certain genetic condition. This medicine may be used for other purposes; ask your health care provider or pharmacist if you have questions. COMMON BRAND NAME(S): Keytruda What should I tell my health care provider before I take this medicine? They need to know if you have any of these conditions:  diabetes  immune system problems  inflammatory bowel disease  liver disease  lung or breathing disease  lupus  received or scheduled to receive an organ transplant or a stem-cell transplant that uses donor stem cells  an unusual or allergic reaction to pembrolizumab, other medicines, foods, dyes, or preservatives  pregnant or trying to get pregnant  breast-feeding How should I use this medicine? This medicine is for infusion into a vein. It is given by a health care professional in a hospital or clinic setting. A special MedGuide will be given to you before each treatment. Be sure to read this information carefully each time. Talk to your pediatrician regarding the use of this medicine in children. While this drug may be prescribed for selected conditions, precautions do apply. Overdosage: If you think you have taken too much of this medicine contact a poison control center or emergency room at once. NOTE: This medicine is only for you. Do not share this medicine with others. What if I miss a dose? It is important not to miss your dose. Call your doctor or health care professional if you are unable to keep an appointment. What may interact with this medicine? Interactions have not been studied. Give  your health care provider a list of all the medicines, herbs, non-prescription drugs, or dietary supplements you use. Also tell them if you smoke, drink alcohol, or use illegal drugs. Some items may interact with your medicine. This list may not describe all possible interactions. Give your health care provider a list of all the medicines, herbs, non-prescription drugs, or dietary supplements you use. Also tell them if you smoke, drink alcohol, or use illegal drugs. Some items may interact with your medicine. What should I watch for while using this medicine? Your condition will be monitored carefully while you are receiving this medicine. You may need blood work done while you are taking this medicine. Do not become pregnant while taking this medicine or for 4 months after stopping it. Women should inform their doctor if they wish to become pregnant or think they might be pregnant. There is a potential for serious side effects to an unborn child. Talk to your health care professional or pharmacist for more information. Do not breast-feed an infant while taking this medicine or for 4 months after the last dose. What side effects may I notice from receiving this medicine? Side effects that you should report to your doctor or health care professional as soon as possible:  allergic reactions like skin rash, itching or hives, swelling of the face, lips, or tongue  bloody or black, tarry  breathing problems  changes in vision  chest pain  chills  confusion  constipation  cough  diarrhea  dizziness or feeling faint or lightheaded  fast or irregular heartbeat  fever  flushing  hair loss  joint pain  low blood counts - this  medicine may decrease the number of white blood cells, red blood cells and platelets. You may be at increased risk for infections and bleeding.  muscle pain  muscle weakness  persistent headache  redness, blistering, peeling or loosening of the skin,  including inside the mouth  signs and symptoms of high blood sugar such as dizziness; dry mouth; dry skin; fruity breath; nausea; stomach pain; increased hunger or thirst; increased urination  signs and symptoms of kidney injury like trouble passing urine or change in the amount of urine  signs and symptoms of liver injury like dark urine, light-colored stools, loss of appetite, nausea, right upper belly pain, yellowing of the eyes or skin  sweating  swollen lymph nodes  weight loss Side effects that usually do not require medical attention (report to your doctor or health care professional if they continue or are bothersome):  decreased appetite  muscle pain  tiredness This list may not describe all possible side effects. Call your doctor for medical advice about side effects. You may report side effects to FDA at 1-800-FDA-1088. Where should I keep my medicine? This drug is given in a hospital or clinic and will not be stored at home. NOTE: This sheet is a summary. It may not cover all possible information. If you have questions about this medicine, talk to your doctor, pharmacist, or health care provider.  2020 [employer] Standard (2018-08-13 13:46:58)

## 2019-03-21 ENCOUNTER — Other Ambulatory Visit: Payer: Self-pay

## 2019-03-21 DIAGNOSIS — C8590 Non-Hodgkin lymphoma, unspecified, unspecified site: Secondary | ICD-10-CM

## 2019-03-21 DIAGNOSIS — C3490 Malignant neoplasm of unspecified part of unspecified bronchus or lung: Secondary | ICD-10-CM

## 2019-03-21 DIAGNOSIS — Z5112 Encounter for antineoplastic immunotherapy: Secondary | ICD-10-CM

## 2019-04-08 NOTE — Progress Notes (Signed)
[facility]  [address] [place], [place] [address] Phone: [phone]  Fax: [phone]   Clinic Day:  04/09/2019  Referring physician: Francesca Oman, DO  Chief Complaint: Toni Griffith is a 78 y.o. 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  HPI: The patient was last seen in the medical oncology clinic on 03/19/2019. At that time, she continued to do well.  She noted worsening bilateral knee swelling and pain, affecting her ADLs.  Otherwise exam was unremarkable. She received cycle #11 pembrolizumab.  During the interim, she has done well except for her knees.  Her daughter states that she has difficulty ambulating secondary to knee pain.  She has received some injections without significant relief.  She notes no other joints significantly affected.  She notes her hands are little stiff when forming sign language letters especially in the left hand.  Her feet "sometimes feel numb".    She denies any fevers, sweats or weight loss.  She has gained 6 pounds.  She denies any adenopathy, bruising or bleeding.  She denies any shortness of breath   Past Medical History:  Diagnosis Date   Arthritis    Cancer ([place])    lymphoma-right hip   Deaf    GERD (gastroesophageal reflux disease)    Hypertension    NOT ON MED AT PRESENT    Past Surgical History:  Procedure Laterality Date   CARDIAC SURGERY  1993   CORONARY ARTERY BYPASS GRAFT     DOUBLE   CYST REMOVAL HAND Right    ENDOBRONCHIAL ULTRASOUND N/A 05/30/2018   Procedure: ENDOBRONCHIAL ULTRASOUND;  Surgeon: Tyler Pita, MD;  Location: [facility] ORS;  Service: Cardiopulmonary;  Laterality: N/A;   PERIPHERAL VASCULAR CATHETERIZATION N/A 04/06/2016   Procedure: Glori Luis Cath Insertion;  Surgeon: Algernon Huxley, MD;  Location: [facility];  Service: Cardiovascular;  Laterality: N/A;     Family History  Problem Relation Age of Onset   Lung cancer Mother    Lung cancer Father    Diabetes Maternal Uncle    Myasthenia gravis Maternal Grandmother    Leukemia Maternal Grandfather     Social History:  reports that she quit smoking about 3 years ago. Her smoking use included cigarettes. She has a 80.00 pack-year smoking history. She has never used smokeless tobacco. She reports current alcohol use. She reports that she does not use drugs. She smoked 1 1/2 packs per day for 20 years (30 pack year smoking history) until 10/2015. She is hearing impaired (deaf). She lives in [place] with her boyfriend. Her daughter lives in [place]. Her daughter, [name]'s contact number is [phone]. She is living with her son. The patient is accompanied by her daughter (over the phone) and the interpreter Francesca Oman) today.  Allergies: No Known Allergies  Current Medications: Current Outpatient Medications  Medication Sig Dispense Refill   acetaminophen (TYLENOL) 500 MG tablet Take 500 mg by mouth every 6 (six) hours as needed for moderate pain.      allopurinol (ZYLOPRIM) 300 MG tablet TAKE 1 TABLET BY MOUTH EVERY DAY 90 tablet 0   aspirin EC 81 MG tablet Take 81 mg by mouth daily.     b complex vitamins tablet Take 1 tablet by mouth daily.     Cholecalciferol (VITAMIN D3) 2000 units capsule Take 2,000 Units by mouth daily.     diclofenac sodium (VOLTAREN)  1 % GEL APPLY 2 G TOPICALLY 3 (THREE) TIMES DAILY AS NEEDED (LEFT KNEE). 100 g 1   DULoxetine (CYMBALTA) 20 MG capsule Take 10 mg by mouth.      ferrous sulfate 325 (65 FE) MG tablet Take 650 mg by mouth daily with breakfast.     FLUoxetine (PROZAC) 10 MG tablet Take 10 mg by mouth daily.     gabapentin (NEURONTIN) 100 MG capsule Take 2 capsules (200 mg total) by mouth at bedtime. 60 capsule 3   lidocaine-prilocaine (EMLA) cream Apply cream 1 hour before chemotherapy treatment and place small peive of saran wrap over  cream to protect clothing 30 g 3   Magnesium 250 MG TABS Take 1 tablet by mouth daily.     ondansetron (ZOFRAN ODT) 4 MG disintegrating tablet Take 1 tablet (4 mg total) by mouth every 8 (eight) hours as needed for nausea or vomiting. 30 tablet 1   pantoprazole (PROTONIX) 40 MG tablet Take 40 mg by mouth daily.      predniSONE (DELTASONE) 20 MG tablet Take 1 tablet (20 mg total) by mouth daily. Take with largest meal of day. 30 tablet 2   vitamin B-12 (CYANOCOBALAMIN) 500 MCG tablet Take 500 mcg by mouth daily.     vitamin E 400 UNIT capsule Take 400 Units by mouth daily.      atorvastatin (LIPITOR) 80 MG tablet Take 80 mg by mouth every evening.      No current facility-administered medications for this visit.    Facility-Administered Medications Ordered in Other Visits  Medication Dose Route Frequency Provider Last Rate Last Dose   heparin lock flush 100 unit/mL  500 Units Intravenous Once Mayzee Reichenbach C, MD       heparin lock flush 100 unit/mL  500 Units Intravenous Once Delainie Chavana C, MD       sodium chloride flush (NS) 0.9 % injection 10 mL  10 mL Intravenous PRN Mike Gip, Ahmiya Abee C, MD   10 mL at 04/05/17 1034   sodium chloride flush (NS) 0.9 % injection 10 mL  10 mL Intravenous PRN Nolon Stalls C, MD   10 mL at 02/27/19 1033   sodium chloride flush (NS) 0.9 % injection 10 mL  10 mL Intravenous PRN Lequita Asal, MD   10 mL at 04/09/19 1345    Review of Systems  Constitutional: Positive for malaise/fatigue. Negative for chills, diaphoresis, fever and weight loss (up 6 pounds).       Feels good "except for knees".  HENT: Positive for hearing loss. Negative for congestion, nosebleeds and sinus pain.   Eyes: Negative.  Negative for blurred vision, double vision, photophobia and pain.  Respiratory: Negative.  Negative for cough, hemoptysis, sputum production and shortness of breath.   Cardiovascular: Negative.  Negative for chest pain, palpitations,  orthopnea and leg swelling.  Gastrointestinal: Negative.  Negative for abdominal pain, blood in stool, constipation, diarrhea, heartburn, melena, nausea and vomiting.       Eating well.  Genitourinary: Negative.  Negative for dysuria, flank pain, frequency, hematuria and urgency.  Musculoskeletal: Positive for joint pain. Negative for back pain, myalgias and neck pain.       Bilateral knee pain affecting ambulation.  Skin: Negative.  Negative for itching and rash.  Neurological: Positive for sensory change (feet sometimes feel numb). Negative for dizziness, tingling, tremors, speech change, focal weakness, weakness and headaches.  Endo/Heme/Allergies: Negative.  Does not bruise/bleed easily.  Psychiatric/Behavioral: Negative.  Negative for depression, hallucinations and memory  loss. The patient is not nervous/anxious and does not have insomnia.    Performance status (ECOG): 2  Vitals Blood pressure 140/85, pulse 91, temperature (!) 97.5 F (36.4 C), temperature source Tympanic, resp. rate 20, weight 176 lb (79.8 kg), SpO2 95 %.   Physical Exam  Constitutional: She is oriented to person, place, and time. Vital signs are normal. She appears well-developed and well-nourished.  Patient sitting comfortably in a wheelchair in no acute distress.  HENT:  Head: Normocephalic and atraumatic.  Short gray hair.  Eyes: Pupils are equal, round, and reactive to light. Conjunctivae and EOM are normal. No scleral icterus.  Hazel eyes.  Neck: Normal range of motion. No JVD present.  Cardiovascular: Normal rate and regular rhythm.  No murmur heard. Pulmonary/Chest: Effort normal and breath sounds normal. No respiratory distress. She has no wheezes. She has no rales.  Abdominal: Soft. Bowel sounds are normal. She exhibits no distension and no mass. There is no abdominal tenderness. There is no rebound and no guarding.  Musculoskeletal: Normal range of motion.        General: No tenderness or edema.      Comments: Slight difficulty forming letters with left hand secondary to stiffness.  Lymphadenopathy:    She has no cervical adenopathy.  Neurological: She is alert and oriented to person, place, and time. No cranial nerve deficit. She exhibits normal muscle tone.  Skin: Skin is warm and dry. No rash noted. She is not diaphoretic. No pallor.  Psychiatric: She has a normal mood and affect. Her behavior is normal. Judgment and thought content normal.  Nursing note and vitals reviewed.   Admissions: 02/20/2016- 02/26/2016: [facility]: She had renal failure and hypercalcemia. She was treated with IVF, calcitonin and Zometa.The patient has a history of coronary artery disease s/p CABG in 1993. She has no history of heart failure.  01/20/2018 - 01/23/2018: [facility]. She presented with nausea, vomiting, diarrhea and generalized weakness. Work-up showed hypokalemia, hypomagnesemia, dehydration and UTI. She was treated with IV fluids and empiric IV antibiotics. Potassium and magnesium were repleted. Urine culture showed E. coli. She was discharged on Augmentin. She was switched from HCTZ to chorthalidone. [facility]  Radiology studies: 02/20/2016: Chest, abdomen and pelvisCT revealed a 3.7 cm subpleural mass within the left lower lobe, a 1.1 cm right lower lobe subpleural nodule , 9 mm RUL nodule, and 0.4 cm lingular nodule. Between the left second, third, and fourth ribs there was 1.9 cm soft tissue mass. There was a 2.5 cm low-attenuation nodule in the left lobe of the thyroid. There were prominent left axillary nodes (up to 1.0 cm). There was no definite mediastinal adenopathy. There was a 5.5 x 4.6 cm cystic lesion in the left hepatic lobe. There was a 3.1 cm nodule in the right adrenal gland. There was a 1.2 cm right retrocaval lymph node with additional prominent subcentimeter retroperitoneal  nodes. There was 1.0 cm left common iliac node. There was a 6.2 x 5.2 cm soft tissue mass within the right ilium.  02/21/2016:Head MRIrevealed no evidence of malignancy.  02/21/2016: Thyroid ultrasound revealed a 2.4 cm solid nodule in the inferior aspect of the left lobe 03/03/2016: PET scanrevealed a hypermetabolic left lower lobe mass, bilateral pleural and pulmonary parenchymal nodular metastasis, a large pleural lesion invading the anterior left chest wall. There was activity in the superior left ocular orbit. There was a large lesion centered in the  right iliac wing with soft tissue extension. There were hypermetabolic right external iliac lymph nodes 06/06/2016:PET scanrevealed significant interval response to therapy. There was significant decrease in size and FDG uptake associated with left lower lobe lung lesion. There was resolution of left-sided pleural base metastasis and left axillary and anterior mediastinal nodal metastasis. There was resolution of previous hypermetabolic soft tissue metastasis/nodal metastasis posterior to the IVC. There was marked improvement in hypermetabolic metastasis involving the right iliac wing. There was persistent right upper lobe lung nodule which exhibits intense radiotracer uptake which may represent a focus of metastatic disease or synchronous primary lung neoplasm. 06/01/2017:PET scanrevealed no new or progressive findings. The 9 mm RUL pulmonary nodule remained hypermetabolic (SUV 56.7, previously 7.3) and no change in size. The index lesion in the posterior left lower lobe was minimally smaller on CT imaging with very low level FDG uptake (SUV 1.6). There was no change in the appearance at site of previous disease at the right iliac wing (SUV 1.9 compared to 2.6 previously). 01/29/2017: PET scanrevealed interval progression of hypermetabolic nodules in the right upper (13 mm compared to 9 mm; SUV 10) and left lower lobes (26 mm  compared to 22 mm; SUV 7.5). There was new hypermetabolic focus of activity along the anterior left pleura although no underlying pleural or lung mass was evident. 09/18/2017: PET scanrevealed a mixed appearance, with marked improvement in the prior 2 pulmonary nodules; but with new Deauville 5 left common iliac lymph node enlargement; new Deauville 5 right paraspinal lesion; and new Deauville 4 left infrahilar nodal activity. There was a focus of accentuated metabolic activity which had been present along the right lower submandibular gland but currently seemed to project over the subcutaneous tissues without CT correlate. Current maximum SUV 10.5, previously 9.7. There was increased accentuated activity along the left lateral spurring at L2-3. While typically such activity is inflammatory due to spurring, this had increased significantly since the prior exam and merit surveillance. There was a new 8 by 5 mm right upper lobe pulmonary nodule, Deauville 2, which also merits surveillance. 01/29/2018: PET scanrevealed persistent and progressive areas of hypermetabolism within the right submandibular gland, lingula of the left lung, left common iliac nodal chain, and right lumbar paraspinal soft tissues. Findings were compatible with Deauville criteria 4 and 5. There was a new nodule is identified within the lingula which demonstrated intense uptake compatible with Deauville criteria 5.  05/14/2018: Chest, abdomen, and pelvisCTrevealed enlarging mediastinal adenopathy and right upper lobe nodule, indicative of disease progression. Necrotic lingular nodules were grossly stable in size with developing surrounding collapse/consolidation, possibly treatment related. There was slight decrease in size of a left external iliac lymph node and right lumbar paraspinal soft tissue lesion. There was a tiny left pleural effusion. 06/24/2018: Head MRI revealed no evidence of intracranial metastasis. 09/03/2018:  Chest, abdomen, and pelvisCTrevealed interval decrease in size of previous index right paratracheal and left hilar lymph nodes. Left-sided pre-vascular lymph nodes have also decreased in size in the interval. There was a high left paratracheal lymph node however which has increased in size (0.6 cm to 1.1 cm). There was progressive masslike architectural distortion within the perihilar left lung and lingula. There were surrounding areas of fibrosis and ground-glass attenuation. These findings may reflect progressive post treatment changes. In the absence of interval treatment progressive neoplasm can not be excluded. There was no residual or recurrent adenopathy within the abdomen or pelvis. The rright paraspinal soft tissue nodule was not confidently visualized. She  has a hiatal hernia and aortic atherosclerosis. 12/26/2018:Chest,abdomen, andpelvisCTrevealed interval increase in dense consolidation of perihilar left lung and lingula and a dense bandlike perifissural consolidation of the inferior right upper lobe, in keeping with evolving post treatment change. Therewas an unchanged small left pleural effusion. Therewas new, diffusely scattered ground-glass opacity bilaterally, with and unusual atoll like appearance and central clearing, most conspicuous in the right upper lobe.These findings were nonspecific and infectious or inflammatory, and particularly drug toxicity is a differential consideration.There was no change in enlarged mediastinal and AP window lymph nodes.There was no evidence of distant metastatic disease or lymphadenopathy in the abdomen or pelvis. 02/26/2019:  Chest CT revealed near resolution of diffuse bilateral ground-glass opacity with central predominance in the lungs previously has nearly resolved in the interval.  The collapse/consolidative change in the inferior right upper lobe and parahilar left lung was stable.  There were atable borderline enlarged prevascular  lymph nodes.   Infusion on 04/09/2019  Component Date Value Ref Range Status   LDH 04/09/2019 129  98 - 192 U/L Final   Performed at [facility], [address] [place], [place] [address]   Sodium 04/09/2019 136  135 - 145 mmol/L Final   Potassium 04/09/2019 3.6  3.5 - 5.1 mmol/L Final   Chloride 04/09/2019 106  98 - 111 mmol/L Final   CO2 04/09/2019 22  22 - 32 mmol/L Final   Glucose, Bld 04/09/2019 134* 70 - 99 mg/dL Final   BUN 04/09/2019 21  8 - 23 mg/dL Final   Creatinine, Ser 04/09/2019 0.77  0.44 - 1.00 mg/dL Final   Calcium 04/09/2019 9.0  8.9 - 10.3 mg/dL Final   Total Protein 04/09/2019 6.9  6.5 - 8.1 g/dL Final   Albumin 04/09/2019 3.3* 3.5 - 5.0 g/dL Final   AST 04/09/2019 23  15 - 41 U/L Final   ALT 04/09/2019 21  0 - 44 U/L Final   Alkaline Phosphatase 04/09/2019 68  38 - 126 U/L Final   Total Bilirubin 04/09/2019 1.3* 0.3 - 1.2 mg/dL Final   GFR calc non Af Amer 04/09/2019 >60  >60 mL/min Final   GFR calc Af Amer 04/09/2019 >60  >60 mL/min Final   Anion gap 04/09/2019 8  5 - 15 Final   Performed at [facility], [address] [place], [place] [address]   WBC 04/09/2019 8.7  4.0 - 10.5 K/uL Final   RBC 04/09/2019 3.98  3.87 - 5.11 MIL/uL Final   Hemoglobin 04/09/2019 13.0  12.0 - 15.0 g/dL Final   HCT 04/09/2019 38.7  36.0 - 46.0 % Final   MCV 04/09/2019 97.2  80.0 - 100.0 fL Final   MCH 04/09/2019 32.7  26.0 - 34.0 pg Final   MCHC 04/09/2019 33.6  30.0 - 36.0 g/dL Final   RDW 04/09/2019 15.9* 11.5 - 15.5 % Final   Platelets 04/09/2019 262  150 - 400 K/uL Final   nRBC 04/09/2019 0.0  0.0 - 0.2 % Final   Neutrophils Relative % 04/09/2019 73  % Final   Neutro Abs 04/09/2019 6.4  1.7 - 7.7 K/uL Final   Lymphocytes Relative 04/09/2019 13  % Final   Lymphs Abs 04/09/2019 1.2  0.7 - 4.0 K/uL Final   Monocytes Relative 04/09/2019 10  % Final   Monocytes Absolute 04/09/2019 0.8  0.1 - 1.0 K/uL Final    Eosinophils Relative 04/09/2019 2  % Final   Eosinophils Absolute 04/09/2019 0.2  0.0 - 0.5 K/uL Final  Basophils Relative 04/09/2019 1  % Final   Basophils Absolute 04/09/2019 0.1  0.0 - 0.1 K/uL Final   Immature Granulocytes 04/09/2019 1  % Final   Abs Immature Granulocytes 04/09/2019 0.06  0.00 - 0.07 K/uL Final   Performed at [facility], [address] [place], [place] [address]    Assessment:  Stevee Valenta is a 78 y.o. female with stage IVB B-cell lymphoma, unclassifiable, with features intermediate between diffuse large B-cell lymphoma and classical Hodgkin's lymphoma ("gray zone" lymphoma). She underwent right iliac wing bone/bone marrow biopsy on 03/08/2016.  PET scanon 03/03/2016 revealed a hypermetabolic left lower lobe mass, bilateral pleural and pulmonary parenchymal nodular metastasis, a large pleural lesion invading the anterior left chest wall. There was activity in the superior left ocular orbit. There was a large lesion centered in the right iliac wing with soft tissue extension. There were hypermetabolic right external iliac lymph nodes  [place] 04/04/2016 revealed an EF of 55-60%. Hepatitis B and C testing was negative on 03/30/2016. G6PD assay was normal. She declined LP with MTX prophylaxis.  She received 6 cycles of mini-RCHOP(04/07/2016 - 07/21/2016). She has had some transient fingertip numbness.  She received radiationto the right iliac wing, from 10/11/2016 - 11/01/2016. She received 3000 cGy over 3 weeks.  PET scanon 01/29/2017 revealed interval progression of hypermetabolic nodules in the right upper (13 mm compared to 9 mm; SUV 10) and left lower lobes (26 mm compared to 22 mm; SUV 7.5). There was new hypermetabolic focus of activity along the anterior left pleura although no underlying pleural or lung mass was evident.  She has squamous cell lung cancer. Foundation Oneon the lung biopsy on 03/15/2018 revealed  MS-stable, tumor mutational burden 5 Muts/Mb, AKT2 amplification, CCNE1 amplification, CREBBP R1446L, KDR amplification, KIT amplification, PDGFRA amplification, PTEN loss exons 2-4, and BH41 splice site [id number]. Marland Kitchen There were no alterations in EGFR, KRAS, RET, ALK, MET, ERBB2, BRAF, ROS1. PDL-1IHC analysis revealed a TPS score of 50%.  CT guided left lower lobe nodule biopsyon 02/19/2017 confirmed squamous cell carcinoma. She underwent SBRT08/27/2018 - 04/09/2017. She received 5000 cGy in 5 fractions to the left lower lobe lesion. She underwent SBRTto a RUL lesion from 05/23/2017 - 06/18/2017.  CT guided left upper lobe nodule biopsyon 02/20/2018 revealedsquamous cell lung cancer. She received SBRTfrom 04/09/2018 - 04/15/2018. She received 6000 cGy in 5 fractions.   Bronchoscopyon 05/30/2018 revealed no endobronchial lesions. The 2 cm pre-carinal lymph node was biopsied. Pathology revealed metastatic non-small cell carcinoma, favor squamous cell carcinoma.  PET scanon 09/18/2017 revealed a mixed appearance, with marked improvement in the prior 2 pulmonary nodules; but with new Deauville 5 left common iliac lymph node enlargement; new Deauville 5 right paraspinal lesion; and new Deauville 4 left infrahilar nodal activity. There was a focus of accentuated metabolic activity which had been present along the right lower submandibular gland but currently seemed to project over the subcutaneous tissues without CT correlate. Current maximum SUV 10.5, previously 9.7. There was increased accentuated activity along the left lateral spurring at L2-3. While typically such activity is inflammatory due to spurring, this had increased significantly since the prior exam and merit surveillance. There was a new 8 by 5 mm right upper lobe pulmonary nodule, Deauville 2.  CT guided right paraspinal lesion biopsyon 10/16/2017 was compatible with involvement by the patient's previously diagnosed  lymphoma. The case was referred to [facility] hematopathology. The neoplastic cells were largely CD30 negative. Repeat CD30 immunohistochemistry faintly stained >10% of  the large atypical cells. CD20 stained the scattered large atypical cells and few small cells.   She received 3 cycles of brentuximab vedotin(11/23/2017 - 01/11/2018).  PET scan on 01/29/2018 revealed persistent and progressive areas of hypermetabolism within the right submandibular gland, lingula of the left lung, left common iliac nodal chain, and right lumbar paraspinal soft tissues. Findings were compatible with Deauville criteria 4 and 5. There was a new nodule is identified within the lingula which demonstrated intense uptake compatible with Deauville criteria 5.   Chest, abdomen, and pelvisCTon 05/14/2018 revealed enlarging mediastinal adenopathy and right upper lobe nodule, indicative of disease progression. Necrotic lingular nodules were grossly stable in size with developing surrounding collapse/consolidation, possibly treatment related. There was slight decrease in size of a left external iliac lymph node and right lumbar paraspinal soft tissue lesion. There was a tiny left pleural effusion.  Head MRIon 06/24/2018 revealed no evidence of intracranial metastasis.  She is s/p11cycles of pembrolizumab(06/14/2018 - 03/19/2019).  She received a steroid Dosepak on 01/09/2019 for minor chest CT changes felt secondary to pembrolizumab.  Chest,abdomen, andpelvisCTon 12/26/2018 revealed interval increase in dense consolidation of perihilar left lung and lingula and a dense bandlike perifissural consolidation of the inferior right upper lobe, in keeping with evolving post treatment change. Therewas an unchanged small left pleural effusion. Therewas new, diffusely scattered ground-glass opacity bilaterally, with and an unusual appearance and central clearing, most conspicuous in the right upper lobe.These findings were  nonspecific and infectious or inflammatory, and particularly drug toxicity is a differential consideration.There was no change in enlarged mediastinal and AP window lymph nodes.There was no evidence of distant metastatic disease or lymphadenopathy in the abdomen or pelvis.COVID-19 testingwas negative on 01/09/2019.  Chest CT on 02/26/2019 revealed near resolution of diffuse bilateral ground-glass opacity with central predominance in the lungs previously has nearly resolved in the interval.  The collapse/consolidative change in the inferior right upper lobe and parahilar left lung was stable.  There were atable borderline enlarged prevascular lymph nodes.  She hasiron deficiency anemia. Ferritinhas been followed: 14 on 05/06/2018,23 on 07/26/2018, and 59 on 11/01/2018. She denies any bleeding.  She has a grade I neuropathy. B12 and folate were normal on 12/06/2017. She is deafand requires an interpreter.  Symptomatically, she is doing well except for arthritis pain in her knees affecting ambulation.  Left hand is a little stiff.  Exam reveals no adenopathy or hepatosplenomegaly.  Bilirubin is 1.3 (direct 0.2).  Plan: 1.   Labs today:CBC with diff, CMP, direct bilirubin, LDH, TSH. 2. Pearline Cables zone lymphoma Clinically, she is doing extremely well.  She denies any B symptoms.   Exam reveals no adenopathy or hepatosplenomegaly.   Scans in 12/18/2018 and 02/17/2019 revealed no evidence of progressive lymphoma. She is tolerating pembrolizumab well except for possible treatment related arthritis (see below). Anticipate imaging studies after next cycle. Cycle #12 pembrolizumab today. 3. Squamous cell lung cancer Clinically, she continues to do well. She denies any respiratory symptoms. Chest CT on 02/26/2019 revealed near resolution of the bilateral groundglass opacities.  Scans as above.    Cycle #12 pembrolizumab today. 4. Ground glass  opacities Patient denies any respiratory symptoms.  Exam is unremarkable.  Reimaging studies in approximately 5-6 weeks. 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  Continue to monitor. 6. B12 deficiency Continue oral B12.  B12 and folate were normal on 12/17/2018. 7.Knee pain Patient continues to have knee pain unrelated to malignancy (no bone/joint metastasis).  Patient has apparent arthritis which may be exacerbated by pembrolizumab.   Incidence of arthritis 2%, myalgias 12% and arthralgias 10-18%.              Discuss referral to orthopedics or rheumatology for further assessment.  Discuss continuation of pembrolizumab given response to both lung cancer and lymphoma in 2 malignancies that have progressed on prior therapy.  Patient in agreement. 8.RTC in 3 weeks for MD assessment, labs (CBC with diff, CMP, LDH, TSH), and pembrolizumab.   Addendum:  Patient will be referred to Dr. Harrel Carina, rheumatologist, for evaluation of possible pembrolizumab induced arthritis.  I discussed the assessment and treatment plan with the patient.  The patient was provided an opportunity to ask questions and all were answered.  The patient agreed with the plan and demonstrated an understanding of the instructions.  The patient was advised to call back if the symptoms worsen or if the condition fails to improve as anticipated.   Lequita Asal, MD, PhD    04/09/2019, 2:18 PM

## 2019-04-09 ENCOUNTER — Encounter: Payer: Self-pay | Admitting: Hematology and Oncology

## 2019-04-09 ENCOUNTER — Inpatient Hospital Stay: Payer: Medicare HMO

## 2019-04-09 ENCOUNTER — Inpatient Hospital Stay: Payer: Medicare HMO | Attending: Hematology and Oncology | Admitting: Hematology and Oncology

## 2019-04-09 ENCOUNTER — Other Ambulatory Visit: Payer: Self-pay

## 2019-04-09 VITALS — BP 152/83 | HR 76 | Temp 97.6°F | Resp 18

## 2019-04-09 VITALS — BP 140/85 | HR 91 | Temp 97.5°F | Resp 20 | Wt 176.0 lb

## 2019-04-09 DIAGNOSIS — M25462 Effusion, left knee: Secondary | ICD-10-CM | POA: Insufficient documentation

## 2019-04-09 DIAGNOSIS — J90 Pleural effusion, not elsewhere classified: Secondary | ICD-10-CM | POA: Diagnosis not present

## 2019-04-09 DIAGNOSIS — Z806 Family history of leukemia: Secondary | ICD-10-CM | POA: Diagnosis not present

## 2019-04-09 DIAGNOSIS — Z5112 Encounter for antineoplastic immunotherapy: Secondary | ICD-10-CM

## 2019-04-09 DIAGNOSIS — M25461 Effusion, right knee: Secondary | ICD-10-CM | POA: Insufficient documentation

## 2019-04-09 DIAGNOSIS — C8518 Unspecified B-cell lymphoma, lymph nodes of multiple sites: Secondary | ICD-10-CM | POA: Diagnosis not present

## 2019-04-09 DIAGNOSIS — R911 Solitary pulmonary nodule: Secondary | ICD-10-CM | POA: Diagnosis not present

## 2019-04-09 DIAGNOSIS — F17210 Nicotine dependence, cigarettes, uncomplicated: Secondary | ICD-10-CM | POA: Diagnosis not present

## 2019-04-09 DIAGNOSIS — H9190 Unspecified hearing loss, unspecified ear: Secondary | ICD-10-CM | POA: Diagnosis not present

## 2019-04-09 DIAGNOSIS — T451X5A Adverse effect of antineoplastic and immunosuppressive drugs, initial encounter: Secondary | ICD-10-CM | POA: Diagnosis not present

## 2019-04-09 DIAGNOSIS — E538 Deficiency of other specified B group vitamins: Secondary | ICD-10-CM | POA: Insufficient documentation

## 2019-04-09 DIAGNOSIS — C8590 Non-Hodgkin lymphoma, unspecified, unspecified site: Secondary | ICD-10-CM

## 2019-04-09 DIAGNOSIS — R5383 Other fatigue: Secondary | ICD-10-CM | POA: Insufficient documentation

## 2019-04-09 DIAGNOSIS — C3412 Malignant neoplasm of upper lobe, left bronchus or lung: Secondary | ICD-10-CM | POA: Insufficient documentation

## 2019-04-09 DIAGNOSIS — Z79899 Other long term (current) drug therapy: Secondary | ICD-10-CM | POA: Insufficient documentation

## 2019-04-09 DIAGNOSIS — M791 Myalgia, unspecified site: Secondary | ICD-10-CM | POA: Insufficient documentation

## 2019-04-09 DIAGNOSIS — D75A Glucose-6-phosphate dehydrogenase (G6PD) deficiency without anemia: Secondary | ICD-10-CM | POA: Insufficient documentation

## 2019-04-09 DIAGNOSIS — M1990 Unspecified osteoarthritis, unspecified site: Secondary | ICD-10-CM | POA: Insufficient documentation

## 2019-04-09 DIAGNOSIS — C3490 Malignant neoplasm of unspecified part of unspecified bronchus or lung: Secondary | ICD-10-CM

## 2019-04-09 DIAGNOSIS — Z801 Family history of malignant neoplasm of trachea, bronchus and lung: Secondary | ICD-10-CM | POA: Insufficient documentation

## 2019-04-09 DIAGNOSIS — R590 Localized enlarged lymph nodes: Secondary | ICD-10-CM | POA: Insufficient documentation

## 2019-04-09 DIAGNOSIS — M170 Bilateral primary osteoarthritis of knee: Secondary | ICD-10-CM | POA: Diagnosis not present

## 2019-04-09 DIAGNOSIS — G620 Drug-induced polyneuropathy: Secondary | ICD-10-CM | POA: Diagnosis not present

## 2019-04-09 DIAGNOSIS — C8330 Diffuse large B-cell lymphoma, unspecified site: Secondary | ICD-10-CM | POA: Insufficient documentation

## 2019-04-09 DIAGNOSIS — Z833 Family history of diabetes mellitus: Secondary | ICD-10-CM | POA: Diagnosis not present

## 2019-04-09 DIAGNOSIS — M25569 Pain in unspecified knee: Secondary | ICD-10-CM | POA: Insufficient documentation

## 2019-04-09 DIAGNOSIS — R17 Unspecified jaundice: Secondary | ICD-10-CM

## 2019-04-09 LAB — COMPREHENSIVE METABOLIC PANEL
ALT: 21 U/L (ref 0–44)
AST: 23 U/L (ref 15–41)
Albumin: 3.3 g/dL — ABNORMAL LOW (ref 3.5–5.0)
Alkaline Phosphatase: 68 U/L (ref 38–126)
Anion gap: 8 (ref 5–15)
BUN: 21 mg/dL (ref 8–23)
CO2: 22 mmol/L (ref 22–32)
Calcium: 9 mg/dL (ref 8.9–10.3)
Chloride: 106 mmol/L (ref 98–111)
Creatinine, Ser: 0.77 mg/dL (ref 0.44–1.00)
GFR calc Af Amer: >60 mL/min (ref >60)
GFR calc non Af Amer: >60 mL/min (ref >60)
Glucose, Bld: 134 mg/dL — ABNORMAL HIGH (ref 70–99)
Potassium: 3.6 mmol/L (ref 3.5–5.1)
Sodium: 136 mmol/L (ref 135–145)
Total Bilirubin: 1.3 mg/dL — ABNORMAL HIGH (ref 0.3–1.2)
Total Protein: 6.9 g/dL (ref 6.5–8.1)

## 2019-04-09 LAB — CBC WITH DIFFERENTIAL/PLATELET
Abs Immature Granulocytes: 0.06 10*3/uL (ref 0.00–0.07)
Basophils Absolute: 0.1 10*3/uL (ref 0.0–0.1)
Basophils Relative: 1 %
Eosinophils Absolute: 0.2 10*3/uL (ref 0.0–0.5)
Eosinophils Relative: 2 %
HCT: 38.7 % (ref 36.0–46.0)
Hemoglobin: 13 g/dL (ref 12.0–15.0)
Immature Granulocytes: 1 %
Lymphocytes Relative: 13 %
Lymphs Abs: 1.2 10*3/uL (ref 0.7–4.0)
MCH: 32.7 pg (ref 26.0–34.0)
MCHC: 33.6 g/dL (ref 30.0–36.0)
MCV: 97.2 fL (ref 80.0–100.0)
Monocytes Absolute: 0.8 10*3/uL (ref 0.1–1.0)
Monocytes Relative: 10 %
Neutro Abs: 6.4 10*3/uL (ref 1.7–7.7)
Neutrophils Relative %: 73 %
Platelets: 262 10*3/uL (ref 150–400)
RBC: 3.98 MIL/uL (ref 3.87–5.11)
RDW: 15.9 % — ABNORMAL HIGH (ref 11.5–15.5)
WBC: 8.7 10*3/uL (ref 4.0–10.5)
nRBC: 0 % (ref 0.0–0.2)

## 2019-04-09 LAB — BILIRUBIN, DIRECT: Bilirubin, Direct: 0.2 mg/dL (ref 0.0–0.2)

## 2019-04-09 LAB — TSH: TSH: 1.726 u[IU]/mL (ref 0.350–4.500)

## 2019-04-09 LAB — LACTATE DEHYDROGENASE: LDH: 129 U/L (ref 98–192)

## 2019-04-09 MED ORDER — HEPARIN SOD (PORK) LOCK FLUSH 100 UNIT/ML IV SOLN
500.0000 [IU] | Freq: Once | INTRAVENOUS | Status: AC
  Administered 2019-04-09: 16:00:00 500 [IU] via INTRAVENOUS
  Filled 2019-04-09: qty 5

## 2019-04-09 MED ORDER — SODIUM CHLORIDE 0.9 % IV SOLN
Freq: Once | INTRAVENOUS | Status: AC
  Administered 2019-04-09: 15:00:00 via INTRAVENOUS
  Filled 2019-04-09: qty 250

## 2019-04-09 MED ORDER — ONDANSETRON HCL 4 MG PO TABS
8.0000 mg | ORAL_TABLET | Freq: Once | ORAL | Status: AC
  Administered 2019-04-09: 8 mg via ORAL
  Filled 2019-04-09: qty 2

## 2019-04-09 MED ORDER — SODIUM CHLORIDE 0.9% FLUSH
10.0000 mL | INTRAVENOUS | Status: DC | PRN
  Administered 2019-04-09: 14:00:00 10 mL via INTRAVENOUS
  Filled 2019-04-09: qty 10

## 2019-04-09 MED ORDER — SODIUM CHLORIDE 0.9 % IV SOLN
200.0000 mg | Freq: Once | INTRAVENOUS | Status: AC
  Administered 2019-04-09: 200 mg via INTRAVENOUS
  Filled 2019-04-09: qty 8

## 2019-04-09 NOTE — Progress Notes (Signed)
Pt here for follow up. Reports increase in pain in bilateral knees since starting treatment. States she received steroid shot with no improvement. Wants to know if this may be related to treatment.

## 2019-04-11 ENCOUNTER — Telehealth: Payer: Self-pay

## 2019-04-11 DIAGNOSIS — M170 Bilateral primary osteoarthritis of knee: Secondary | ICD-10-CM | POA: Insufficient documentation

## 2019-04-11 NOTE — Telephone Encounter (Signed)
New patient information was faxed over to Dr Cherlynn Kaiser [phone]) and faxed conformation confirmed.

## 2019-04-29 ENCOUNTER — Other Ambulatory Visit: Payer: Self-pay

## 2019-04-29 ENCOUNTER — Encounter: Payer: Self-pay | Admitting: Hematology and Oncology

## 2019-04-29 NOTE — Progress Notes (Signed)
[facility]  [address] [place], [place] [address] Phone: [phone]  Fax: [phone]   Clinic Day:  04/30/2019  Referring physician: Francesca Oman, DO  Chief Complaint: Toni Griffith is a 78 y.o. 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 At that time, she was doing well except for arthritis pain in her knees affecting her ambulation.  Left hand was a little stiff.  Exam revealed no adenopathy or hepatosplenomegaly.    Hematocrit was 38.7, hemoglobin 13, MCV 97.2, platelets 262,000, white count 8700.  TSH was 1.726.  Bilirubin was 1.3 (direct 0.2).  She received cycle #12 pembrolizumab.  Patient was referred to Dr. Harrel Carina, rheumatologist, for evaluation of possible pembrolizumab induced arthritis.  She was seen by Daniel Nones, PA in the Orthopedics Clinic at [facility].  She notes severe bilateral knee pain with no response to cortisone injections.  She received bilateral Monovisc (hyaluronate derivatives) injection. She was felt not to be a surgical candidate because of her cancer and ongoing treatment.  During the interim, she has done well except for her knees.  She notes that the injection did not help. She is not a candidate for surgery.  She notes that the pain in her knees is the same as it was at last visit.  Her daughter notes that her knee pain preceded treatment, but it has gotten worse.  She denies any shortness of breath, fever, adenopathy, early satiety, bruising or bleeding.   Past Medical History:  Diagnosis Date  . Arthritis   . Cancer [facility])    lymphoma-right hip  . Deaf   . GERD (gastroesophageal reflux disease)   . Hypertension    NOT ON MED AT PRESENT    Past Surgical History:  Procedure Laterality Date  . CARDIAC SURGERY  1993  . CORONARY ARTERY BYPASS GRAFT      DOUBLE  . CYST REMOVAL HAND Right   . ENDOBRONCHIAL ULTRASOUND N/A 05/30/2018   Procedure: ENDOBRONCHIAL ULTRASOUND;  Surgeon: Tyler Pita, MD;  Location: [facility] ORS;  Service: Cardiopulmonary;  Laterality: N/A;  . PERIPHERAL VASCULAR CATHETERIZATION N/A 04/06/2016   Procedure: Glori Luis Cath Insertion;  Surgeon: Algernon Huxley, MD;  Location: [facility];  Service: Cardiovascular;  Laterality: N/A;    Family History  Problem Relation Age of Onset  . Lung cancer Mother   . Lung cancer Father   . Diabetes Maternal Uncle   . Myasthenia gravis Maternal Grandmother   . Leukemia Maternal Grandfather     Social History:  reports that she quit smoking about 3 years ago. Her smoking use included cigarettes. She has a 80.00 pack-year smoking history. She has never used smokeless tobacco. She reports current alcohol use. She reports that she does not use drugs. She smoked 1 1/2 packs per day for 20 years (30 pack year smoking history) until 10/2015. She is hearing impaired (deaf). She lives in [place] with her boyfriend. Her daughter lives in [place]. Her daughter, [name]'s contact number is [phone]. She is living with her son. The patient is accompanied by interpreter, Carlene, and her daughter on video call today.  Allergies: No Known Allergies  Current Medications: Current Outpatient Medications  Medication Sig Dispense Refill  . acetaminophen (TYLENOL) 500 MG tablet Take 500 mg by mouth every 6 (six) hours as  needed for moderate pain.     Marland Kitchen allopurinol (ZYLOPRIM) 300 MG tablet TAKE 1 TABLET BY MOUTH EVERY DAY 90 tablet 0  . aspirin EC 81 MG tablet Take 81 mg by mouth daily.    Marland Kitchen b complex vitamins tablet Take 1 tablet by mouth daily.    . Cholecalciferol (VITAMIN D3) 2000 units capsule Take 2,000 Units by mouth daily.    . diclofenac sodium (VOLTAREN) 1 % GEL APPLY 2 G TOPICALLY 3 (THREE) TIMES DAILY AS NEEDED (LEFT KNEE). 100 g 1  . DULoxetine (CYMBALTA) 20 MG capsule Take  10 mg by mouth.     . ferrous sulfate 325 (65 FE) MG tablet Take 650 mg by mouth daily with breakfast.    . FLUoxetine (PROZAC) 10 MG tablet Take 10 mg by mouth daily.    Marland Kitchen gabapentin (NEURONTIN) 100 MG capsule Take 2 capsules (200 mg total) by mouth at bedtime. 60 capsule 3  . lidocaine-prilocaine (EMLA) cream Apply cream 1 hour before chemotherapy treatment and place small peive of saran wrap over cream to protect clothing 30 g 3  . Magnesium 250 MG TABS Take 1 tablet by mouth daily.    . ondansetron (ZOFRAN ODT) 4 MG disintegrating tablet Take 1 tablet (4 mg total) by mouth every 8 (eight) hours as needed for nausea or vomiting. 30 tablet 1  . pantoprazole (PROTONIX) 40 MG tablet Take 40 mg by mouth daily.     . vitamin B-12 (CYANOCOBALAMIN) 500 MCG tablet Take 500 mcg by mouth daily.    . vitamin E 400 UNIT capsule Take 400 Units by mouth daily.     Marland Kitchen atorvastatin (LIPITOR) 80 MG tablet Take 80 mg by mouth every evening.      No current facility-administered medications for this visit.    Facility-Administered Medications Ordered in Other Visits  Medication Dose Route Frequency Provider Last Rate Last Dose  . heparin lock flush 100 unit/mL  500 Units Intravenous Once Corcoran, Melissa C, MD      . sodium chloride flush (NS) 0.9 % injection 10 mL  10 mL Intravenous PRN Nolon Stalls C, MD   10 mL at 04/05/17 1034  . sodium chloride flush (NS) 0.9 % injection 10 mL  10 mL Intravenous PRN Lequita Asal, MD   10 mL at 02/27/19 1033    Review of Systems  Constitutional: Negative.  Negative for chills, diaphoresis, fever, malaise/fatigue and weight loss (stable).       Feels good "except for knees".  HENT: Positive for hearing loss (deaf). Negative for congestion, nosebleeds and sinus pain.   Eyes: Negative.  Negative for blurred vision, double vision, photophobia and pain.  Respiratory: Negative.  Negative for cough, hemoptysis, sputum production and shortness of breath.    Cardiovascular: Negative.  Negative for chest pain, palpitations, orthopnea and leg swelling.  Gastrointestinal: Negative.  Negative for abdominal pain, blood in stool, constipation, diarrhea, heartburn, melena, nausea and vomiting.       Eating well.  Genitourinary: Negative.  Negative for dysuria, flank pain, frequency, hematuria and urgency.  Musculoskeletal: Positive for joint pain. Negative for back pain, myalgias and neck pain.       Bilateral knee pain affecting ambulation.  Skin: Negative.  Negative for itching and rash.  Neurological: Positive for sensory change (feet sometimes feel numb). Negative for dizziness, tremors, speech change, focal weakness, weakness and headaches.  Endo/Heme/Allergies: Negative.  Does not bruise/bleed easily.  Psychiatric/Behavioral: Negative.  Negative for depression, hallucinations and memory  loss. The patient is not nervous/anxious and does not have insomnia.    Performance status (ECOG):   2  Vitals Blood pressure (!) 146/85, pulse 86, temperature (!) 97.1 F (36.2 C), temperature source Tympanic, resp. rate 18, height _0  (1.6 m), weight 176 lb (79.8 kg).   Physical Exam  Constitutional: She is oriented to person, place, and time. Vital signs are normal. She appears well-developed and well-nourished.  Patient sitting comfortably in a wheelchair in no acute distress.  HENT:  Head: Normocephalic and atraumatic.  Short gray hair.  Mask.  Eyes: Pupils are equal, round, and reactive to light. Conjunctivae and EOM are normal. No scleral icterus.  Hazel eyes.  Neck: Normal range of motion. Neck supple. No JVD present.  Cardiovascular: Normal rate, regular rhythm and normal heart sounds.  No murmur heard. Pulmonary/Chest: Effort normal and breath sounds normal. No respiratory distress. She has no wheezes. She has no rales.  Abdominal: Soft. Bowel sounds are normal. She exhibits no distension and no mass. There is no abdominal tenderness. There is no  rebound and no guarding.  Musculoskeletal: Normal range of motion.        General: No tenderness or edema.     Left knee: She exhibits no swelling and no erythema.     Comments: No knee effusions.  Lymphadenopathy:    She has no cervical adenopathy.  Neurological: She is alert and oriented to person, place, and time. No cranial nerve deficit. She exhibits normal muscle tone.  Skin: Skin is warm and dry. No rash noted. She is not diaphoretic. No pallor.  Psychiatric: She has a normal mood and affect. Her behavior is normal. Judgment and thought content normal.  Nursing note and vitals reviewed.   Infusion on 04/30/2019  Component Date Value Ref Range Status  . TSH 04/30/2019 1.571  0.350 - 4.500 uIU/mL Final   Comment: Performed by a 3rd Generation assay with a functional sensitivity of <=0.01 uIU/mL. Performed at [facility], [address] [place], [place] [address]   . LDH 04/30/2019 135  98 - 192 U/L Final   Performed at [facility], [address], [place], [place] [address]  . Sodium 04/30/2019 136  135 - 145 mmol/L Final  . Potassium 04/30/2019 4.1  3.5 - 5.1 mmol/L Final  . Chloride 04/30/2019 105  98 - 111 mmol/L Final  . CO2 04/30/2019 22  22 - 32 mmol/L Final  . Glucose, Bld 04/30/2019 124* 70 - 99 mg/dL Final  . BUN 04/30/2019 25* 8 - 23 mg/dL Final  . Creatinine, Ser 04/30/2019 0.89  0.44 - 1.00 mg/dL Final  . Calcium 04/30/2019 8.9  8.9 - 10.3 mg/dL Final  . Total Protein 04/30/2019 7.2  6.5 - 8.1 g/dL Final  . Albumin 04/30/2019 3.3* 3.5 - 5.0 g/dL Final  . AST 04/30/2019 20  15 - 41 U/L Final  . ALT 04/30/2019 17  0 - 44 U/L Final  . Alkaline Phosphatase 04/30/2019 71  38 - 126 U/L Final  . Total Bilirubin 04/30/2019 1.4* 0.3 - 1.2 mg/dL Final  . GFR calc non Af Amer 04/30/2019 >60  >60 mL/min Final  . GFR calc Af Amer 04/30/2019 >60  >60 mL/min Final  . Anion gap 04/30/2019 9  5 - 15 Final   Performed at [facility],  [address] [place], [place] [address]  . WBC 04/30/2019 8.6  4.0 - 10.5 K/uL Final  . RBC 04/30/2019 3.98  3.87 - 5.11 MIL/uL Final  . Hemoglobin 04/30/2019 13.1  12.0 - 15.0 g/dL Final  . HCT 04/30/2019 39.0  36.0 - 46.0 % Final  . MCV 04/30/2019 98.0  80.0 - 100.0 fL Final  . MCH 04/30/2019 32.9  26.0 - 34.0 pg Final  . MCHC 04/30/2019 33.6  30.0 - 36.0 g/dL Final  . RDW 04/30/2019 15.3  11.5 - 15.5 % Final  . Platelets 04/30/2019 286  150 - 400 K/uL Final  . nRBC 04/30/2019 0.0  0.0 - 0.2 % Final  . Neutrophils Relative % 04/30/2019 69  % Final  . Neutro Abs 04/30/2019 6.0  1.7 - 7.7 K/uL Final  . Lymphocytes Relative 04/30/2019 16  % Final  . Lymphs Abs 04/30/2019 1.4  0.7 - 4.0 K/uL Final  . Monocytes Relative 04/30/2019 11  % Final  . Monocytes Absolute 04/30/2019 0.9  0.1 - 1.0 K/uL Final  . Eosinophils Relative 04/30/2019 2  % Final  . Eosinophils Absolute 04/30/2019 0.2  0.0 - 0.5 K/uL Final  . Basophils Relative 04/30/2019 1  % Final  . Basophils Absolute 04/30/2019 0.1  0.0 - 0.1 K/uL Final  . Immature Granulocytes 04/30/2019 1  % Final  . Abs Immature Granulocytes 04/30/2019 0.05  0.00 - 0.07 K/uL Final   Performed at [facility], [address] [place], [place] [address]    Assessment:  Elysabeth Aust is a 78 y.o. female stage IVB B-cell lymphoma, unclassifiable, with features intermediate between diffuse large B-cell lymphoma and classical Hodgkin's lymphoma ("gray zone" lymphoma). She underwent right iliac wing bone/bone marrow biopsy on 03/08/2016.  PET scanon 03/03/2016 revealed a hypermetabolic left lower lobe mass, bilateral pleural and pulmonary parenchymal nodular metastasis, a large pleural lesion invading the anterior left chest wall. There was activity in the superior left ocular orbit. There was a large lesion centered in the right iliac wing with soft tissue extension. There were hypermetabolic right external iliac lymph nodes   [place] 04/04/2016 revealed an EF of 55-60%. Hepatitis B and C testing was negative on 03/30/2016. G6PD assay was normal. She declined LP with MTX prophylaxis.  She received 6 cycles of mini-RCHOP(04/07/2016 - 07/21/2016). She has had some transient fingertip numbness.  She received radiationto the right iliac wing, from 10/11/2016 - 11/01/2016. She received 3000 cGy over 3 weeks.  PET scanon 01/29/2017 revealed interval progression of hypermetabolic nodules in the right upper (13 mm compared to 9 mm; SUV 10) and left lower lobes (26 mm compared to 22 mm; SUV 7.5). There was new hypermetabolic focus of activity along the anterior left pleura although no underlying pleural or lung mass was evident.  She has squamous cell lung cancer. Foundation Oneon the lung biopsy on 03/15/2018 revealed MS-stable, tumor mutational burden 5 Muts/Mb, AKT2 amplification, CCNE1 amplification, CREBBP R1446L, KDR amplification, KIT amplification, PDGFRA amplification, PTEN loss exons 2-4, and AL93 splice site 790+2I>O. Marland Kitchen There were no alterations in EGFR, KRAS, RET, ALK, MET, ERBB2, BRAF, ROS1. PDL-1IHC analysis revealed a TPS score of 50%.  CT guided left lower lobe nodule biopsyon 02/19/2017 confirmed squamous cell carcinoma. She underwent SBRT08/27/2018 - 04/09/2017. She received 5000 cGy in 5 fractions to the left lower lobe lesion. She underwent SBRTto a RUL lesion from 05/23/2017 - 06/18/2017.  CT guided left upper lobe nodule biopsyon 02/20/2018 revealedsquamous cell lung cancer. She received SBRTfrom 04/09/2018 - 04/15/2018. She received 6000 cGy in 5 fractions.   Bronchoscopyon 05/30/2018 revealed no endobronchial lesions. The 2 cm pre-carinal  lymph node was biopsied. Pathology revealed metastatic non-small cell carcinoma, favor squamous cell carcinoma.  PET scanon 09/18/2017 revealed a mixed appearance, with marked improvement in the prior 2 pulmonary nodules; but with  new Deauville 5 left common iliac lymph node enlargement; new Deauville 5 right paraspinal lesion; and new Deauville 4 left infrahilar nodal activity. There was a focus of accentuated metabolic activity which had been present along the right lower submandibular gland but currently seemed to project over the subcutaneous tissues without CT correlate. Current maximum SUV 10.5, previously 9.7. There was increased accentuated activity along the left lateral spurring at L2-3. While typically such activity is inflammatory due to spurring, this had increased significantly since the prior exam and merit surveillance. There was a new 8 by 5 mm right upper lobe pulmonary nodule, Deauville 2.  CT guided right paraspinal lesion biopsyon 10/16/2017 was compatible with involvement by the patient's previously diagnosed lymphoma. The case was referred to [facility] hematopathology. The neoplastic cells were largely CD30 negative. Repeat CD30 immunohistochemistry faintly stained >10% of the large atypical cells. CD20 stained the scattered large atypical cells and few small cells.   She received 3 cycles of brentuximab vedotin(11/23/2017 - 01/11/2018).  PET scan on 01/29/2018 revealed persistent and progressive areas of hypermetabolism within the right submandibular gland, lingula of the left lung, left common iliac nodal chain, and right lumbar paraspinal soft tissues. Findings were compatible with Deauville criteria 4 and 5. There was a new nodule is identified within the lingula which demonstrated intense uptake compatible with Deauville criteria 5.   Chest, abdomen, and pelvisCTon 05/14/2018 revealed enlarging mediastinal adenopathy and right upper lobe nodule, indicative of disease progression. Necrotic lingular nodules were grossly stable in size with developing surrounding collapse/consolidation, possibly treatment related. There was slight decrease in size of a left external iliac lymph node and right lumbar  paraspinal soft tissue lesion. There was a tiny left pleural effusion.  Head MRIon 06/24/2018 revealed no evidence of intracranial metastasis.  She is s/p12cycles of pembrolizumab(06/14/2018 - 04/09/2019). She received a steroid Dosepak on 01/09/2019 for minor chest CT changes felt secondary to pembrolizumab.  Chest,abdomen, andpelvisCTon 12/26/2018 revealed interval increase in dense consolidation of perihilar left lung and lingula and a dense bandlike perifissural consolidation of the inferior right upper lobe, in keeping with evolving post treatment change. Therewas an unchanged small left pleural effusion. Therewas new, diffusely scattered ground-glass opacity bilaterally, with and an unusual appearance and central clearing, most conspicuous in the right upper lobe.These findings were nonspecific and infectious or inflammatory, and particularly drug toxicity is a differential consideration.There was no change in enlarged mediastinal and AP window lymph nodes.There was no evidence of distant metastatic disease or lymphadenopathy in the abdomen or pelvis.COVID-19 testingwas negative on 01/09/2019.  Chest CTon 02/26/2019 revealed near resolution of diffuse bilateral ground-glass opacity with central predominance in the lungs previously has nearly resolved in the interval. The collapse/consolidative change in the inferior right upper lobe and parahilar left lung was stable. There were atable borderline enlarged prevascular lymph nodes.  She hasiron deficiency anemia. Ferritinhas been followed: 14 on 05/06/2018,23 on 07/26/2018, and 59 on 11/01/2018. She denies any bleeding.  She has a grade I neuropathy. B12 and folate were normal on 12/06/2017. She has severe bilateral knee arthritis.  She has received steroid injections and Monovisc (hyaluronate derivatives) without improvement.   She is deafand requires an interpreter  Symptomatically, she is doing well except  for knee pain.  Exam reveals no adenopathy or hepatosplenomegaly.  Plan:  1.   Labs today:CBC with diff, CMP, direct bilirubin, LDH, TSH. 2. Pearline Cables zone lymphoma Clinically, she is doing well. She denies any B symptoms.  Exam reveals no adenopathy or hepatosplenomegaly.   Last scans on 12/26/2018 and 02/26/2019 were reviewed. Discuss plan for follow-up imaging.  Patient would like to defer until after the next cycle.  Unclear if pembrolizumab is causing any increased knee pain. She wishes to continue treatment.   Labs reviewed.  Proceed with cycle #13 pembrolizumab today. 3. Squamous cell lung cancer Clinically, she is doing well. She continues to deny any respiratory symptoms. Discuss plans to follow-up on bilateral groundglass opacities noted on chest CT dated 02/26/2019.  Patient wishes to defer imaging until after the next cycle.         Cycle #13 pembrolizumab today. 4. Ground glass opacities Patient denies any respiratory symptoms.             Exam remains unremarkable.             Continue to monitor closely. 5.Chemotherapy-induced neuropathy Patient denies any change in very mild grade I neuropathy in her feet.  Continue to monitor without intervention. 6. B12 deficiency Patient remains on oral B12.  B12 and folate were normal on 12/17/2018. 7.Bilateral knee pain Review interval consultation and [place].  Discuss prior plan for patient to see Dr. Harrel Carina, rheumatologist. She has baseline arthritis which may be exacerbated by pembrolizumab.                         Incidence of arthritis 2%, myalgias 12% and arthralgias 10-18%.     She wishes to continue pembroplizumab. 8.   Refer Dr. Harrel Carina, rheumatologist re: arthritis. 9.   RTC in 3 weeks for MD assessment, labs (CBC with diff, CMP, LDH, TSH), and cycle #14 pembrolizumab.  I discussed the assessment and  treatment plan with the patient.  The patient was provided an opportunity to ask questions and all were answered.  The patient agreed with the plan and demonstrated an understanding of the instructions.  The patient was advised to call back if the symptoms worsen or if the condition fails to improve as anticipated.  I provided 24 minutes (2:45 PM - 3:08 PM) of face-to-face time during this this encounter and > 50% was spent counseling as documented under my assessment and plan.    Lequita Asal, MD, PhD    04/30/2019, 5:38 PM  I, Samul Dada, am acting as a scribe for Lequita Asal, MD.  I, [place] Mike Gip, MD, have reviewed the above documentation for accuracy and completeness, and I agree with the above.

## 2019-04-29 NOTE — Progress Notes (Signed)
Patient's daughter-[name] stated that her mother had been doing well with no complaints. [name] would like to know if her mother could come every 6 weeks instead of every 3 weeks.

## 2019-04-30 ENCOUNTER — Inpatient Hospital Stay: Payer: Medicare HMO

## 2019-04-30 ENCOUNTER — Encounter: Payer: Self-pay | Admitting: Hematology and Oncology

## 2019-04-30 ENCOUNTER — Inpatient Hospital Stay (HOSPITAL_BASED_OUTPATIENT_CLINIC_OR_DEPARTMENT_OTHER): Payer: Medicare HMO | Admitting: Hematology and Oncology

## 2019-04-30 VITALS — BP 146/85 | HR 86 | Temp 97.1°F | Resp 18 | Ht 63.0 in | Wt 176.0 lb

## 2019-04-30 VITALS — BP 150/93 | HR 81 | Temp 96.1°F | Resp 18

## 2019-04-30 DIAGNOSIS — C3490 Malignant neoplasm of unspecified part of unspecified bronchus or lung: Secondary | ICD-10-CM

## 2019-04-30 DIAGNOSIS — Z5112 Encounter for antineoplastic immunotherapy: Secondary | ICD-10-CM

## 2019-04-30 DIAGNOSIS — G620 Drug-induced polyneuropathy: Secondary | ICD-10-CM | POA: Diagnosis not present

## 2019-04-30 DIAGNOSIS — C8518 Unspecified B-cell lymphoma, lymph nodes of multiple sites: Secondary | ICD-10-CM | POA: Diagnosis not present

## 2019-04-30 DIAGNOSIS — M25562 Pain in left knee: Secondary | ICD-10-CM

## 2019-04-30 DIAGNOSIS — T451X5A Adverse effect of antineoplastic and immunosuppressive drugs, initial encounter: Secondary | ICD-10-CM

## 2019-04-30 DIAGNOSIS — G8929 Other chronic pain: Secondary | ICD-10-CM

## 2019-04-30 DIAGNOSIS — C8590 Non-Hodgkin lymphoma, unspecified, unspecified site: Secondary | ICD-10-CM

## 2019-04-30 LAB — COMPREHENSIVE METABOLIC PANEL
ALT: 17 U/L (ref 0–44)
AST: 20 U/L (ref 15–41)
Albumin: 3.3 g/dL — ABNORMAL LOW (ref 3.5–5.0)
Alkaline Phosphatase: 71 U/L (ref 38–126)
Anion gap: 9 (ref 5–15)
BUN: 25 mg/dL — ABNORMAL HIGH (ref 8–23)
CO2: 22 mmol/L (ref 22–32)
Calcium: 8.9 mg/dL (ref 8.9–10.3)
Chloride: 105 mmol/L (ref 98–111)
Creatinine, Ser: 0.89 mg/dL (ref 0.44–1.00)
GFR calc Af Amer: >60 mL/min (ref >60)
GFR calc non Af Amer: >60 mL/min (ref >60)
Glucose, Bld: 124 mg/dL — ABNORMAL HIGH (ref 70–99)
Potassium: 4.1 mmol/L (ref 3.5–5.1)
Sodium: 136 mmol/L (ref 135–145)
Total Bilirubin: 1.4 mg/dL — ABNORMAL HIGH (ref 0.3–1.2)
Total Protein: 7.2 g/dL (ref 6.5–8.1)

## 2019-04-30 LAB — CBC WITH DIFFERENTIAL/PLATELET
Abs Immature Granulocytes: 0.05 10*3/uL (ref 0.00–0.07)
Basophils Absolute: 0.1 10*3/uL (ref 0.0–0.1)
Basophils Relative: 1 %
Eosinophils Absolute: 0.2 10*3/uL (ref 0.0–0.5)
Eosinophils Relative: 2 %
HCT: 39 % (ref 36.0–46.0)
Hemoglobin: 13.1 g/dL (ref 12.0–15.0)
Immature Granulocytes: 1 %
Lymphocytes Relative: 16 %
Lymphs Abs: 1.4 10*3/uL (ref 0.7–4.0)
MCH: 32.9 pg (ref 26.0–34.0)
MCHC: 33.6 g/dL (ref 30.0–36.0)
MCV: 98 fL (ref 80.0–100.0)
Monocytes Absolute: 0.9 10*3/uL (ref 0.1–1.0)
Monocytes Relative: 11 %
Neutro Abs: 6 10*3/uL (ref 1.7–7.7)
Neutrophils Relative %: 69 %
Platelets: 286 10*3/uL (ref 150–400)
RBC: 3.98 MIL/uL (ref 3.87–5.11)
RDW: 15.3 % (ref 11.5–15.5)
WBC: 8.6 10*3/uL (ref 4.0–10.5)
nRBC: 0 % (ref 0.0–0.2)

## 2019-04-30 LAB — TSH: TSH: 1.571 u[IU]/mL (ref 0.350–4.500)

## 2019-04-30 LAB — LACTATE DEHYDROGENASE: LDH: 135 U/L (ref 98–192)

## 2019-04-30 MED ORDER — SODIUM CHLORIDE 0.9 % IV SOLN
Freq: Once | INTRAVENOUS | Status: AC
  Administered 2019-04-30: 15:00:00 via INTRAVENOUS
  Filled 2019-04-30: qty 250

## 2019-04-30 MED ORDER — HEPARIN SOD (PORK) LOCK FLUSH 100 UNIT/ML IV SOLN
500.0000 [IU] | Freq: Once | INTRAVENOUS | Status: AC
  Administered 2019-04-30: 500 [IU] via INTRAVENOUS

## 2019-04-30 MED ORDER — SODIUM CHLORIDE 0.9 % IV SOLN
200.0000 mg | Freq: Once | INTRAVENOUS | Status: AC
  Administered 2019-04-30: 16:00:00 200 mg via INTRAVENOUS
  Filled 2019-04-30: qty 8

## 2019-04-30 MED ORDER — ACETAMINOPHEN 325 MG PO TABS
650.0000 mg | ORAL_TABLET | Freq: Once | ORAL | Status: AC
  Administered 2019-04-30: 17:00:00 650 mg via ORAL

## 2019-04-30 MED ORDER — SODIUM CHLORIDE 0.9% FLUSH
10.0000 mL | INTRAVENOUS | Status: DC | PRN
  Administered 2019-04-30: 10 mL via INTRAVENOUS
  Filled 2019-04-30: qty 10

## 2019-04-30 MED ORDER — ONDANSETRON HCL 4 MG PO TABS
8.0000 mg | ORAL_TABLET | Freq: Once | ORAL | Status: AC
  Administered 2019-04-30: 8 mg via ORAL
  Filled 2019-04-30: qty 2

## 2019-04-30 NOTE — Patient Instructions (Signed)
Pembrolizumab injection What is this medicine? PEMBROLIZUMAB (pem broe liz ue mab) is a monoclonal antibody. It is used to treat bladder cancer, cervical cancer, endometrial cancer, esophageal cancer, head and neck cancer, hepatocellular cancer, Hodgkin lymphoma, kidney cancer, lymphoma, melanoma, Merkel cell carcinoma, lung cancer, stomach cancer, urothelial cancer, and cancers that have a certain genetic condition. This medicine may be used for other purposes; ask your health care provider or pharmacist if you have questions. COMMON BRAND NAME(S): Keytruda What should I tell my health care provider before I take this medicine? They need to know if you have any of these conditions:  diabetes  immune system problems  inflammatory bowel disease  liver disease  lung or breathing disease  lupus  received or scheduled to receive an organ transplant or a stem-cell transplant that uses donor stem cells  an unusual or allergic reaction to pembrolizumab, other medicines, foods, dyes, or preservatives  pregnant or trying to get pregnant  breast-feeding How should I use this medicine? This medicine is for infusion into a vein. It is given by a health care professional in a hospital or clinic setting. A special MedGuide will be given to you before each treatment. Be sure to read this information carefully each time. Talk to your pediatrician regarding the use of this medicine in children. While this drug may be prescribed for selected conditions, precautions do apply. Overdosage: If you think you have taken too much of this medicine contact a poison control center or emergency room at once. NOTE: This medicine is only for you. Do not share this medicine with others. What if I miss a dose? It is important not to miss your dose. Call your doctor or health care professional if you are unable to keep an appointment. What may interact with this medicine? Interactions have not been studied. Give  your health care provider a list of all the medicines, herbs, non-prescription drugs, or dietary supplements you use. Also tell them if you smoke, drink alcohol, or use illegal drugs. Some items may interact with your medicine. This list may not describe all possible interactions. Give your health care provider a list of all the medicines, herbs, non-prescription drugs, or dietary supplements you use. Also tell them if you smoke, drink alcohol, or use illegal drugs. Some items may interact with your medicine. What should I watch for while using this medicine? Your condition will be monitored carefully while you are receiving this medicine. You may need blood work done while you are taking this medicine. Do not become pregnant while taking this medicine or for 4 months after stopping it. Women should inform their doctor if they wish to become pregnant or think they might be pregnant. There is a potential for serious side effects to an unborn child. Talk to your health care professional or pharmacist for more information. Do not breast-feed an infant while taking this medicine or for 4 months after the last dose. What side effects may I notice from receiving this medicine? Side effects that you should report to your doctor or health care professional as soon as possible:  allergic reactions like skin rash, itching or hives, swelling of the face, lips, or tongue  bloody or black, tarry  breathing problems  changes in vision  chest pain  chills  confusion  constipation  cough  diarrhea  dizziness or feeling faint or lightheaded  fast or irregular heartbeat  fever  flushing  hair loss  joint pain  low blood counts - this  medicine may decrease the number of white blood cells, red blood cells and platelets. You may be at increased risk for infections and bleeding.  muscle pain  muscle weakness  persistent headache  redness, blistering, peeling or loosening of the skin,  including inside the mouth  signs and symptoms of high blood sugar such as dizziness; dry mouth; dry skin; fruity breath; nausea; stomach pain; increased hunger or thirst; increased urination  signs and symptoms of kidney injury like trouble passing urine or change in the amount of urine  signs and symptoms of liver injury like dark urine, light-colored stools, loss of appetite, nausea, right upper belly pain, yellowing of the eyes or skin  sweating  swollen lymph nodes  weight loss Side effects that usually do not require medical attention (report to your doctor or health care professional if they continue or are bothersome):  decreased appetite  muscle pain  tiredness This list may not describe all possible side effects. Call your doctor for medical advice about side effects. You may report side effects to FDA at 1-800-FDA-1088. Where should I keep my medicine? This drug is given in a hospital or clinic and will not be stored at home. NOTE: This sheet is a summary. It may not cover all possible information. If you have questions about this medicine, talk to your doctor, pharmacist, or health care provider.  2020 [employer] Standard (2018-08-13 13:46:58)

## 2019-05-20 NOTE — Progress Notes (Signed)
[facility]  [address], [address] [place], [place] [address] Phone: [phone]  Fax: [phone]   Clinic Day:  05/21/2019   Referring physician: Francesca Oman, DO  Chief Complaint: Toni Griffith is a 78 y.o. 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 At that time, she is doing well except for knee pain.  Exam reveals no adenopathy or hepatosplenomegaly. Labs revealed hematocrit 39.0, hemoglobin 13.1, MCV 98.0, platelets 286,000, WBC 8,600, ANC 6,000.  She was referred to Dr Harrel Carina, rheumatologist.  She received cycle #13 pembrolizumab.    During the interim, she has felt "pretty good".  She still has pain in her knee.  Pain is tolerable.  She has an appointment with rheumatology in 08/2019.  She continues to have a little numbness around her heel.  She  denies any fevers, sweats or weight loss.  She denies any adenopathy, bruising or bleeding.  She denies any shortness of breath.   Past Medical History:  Diagnosis Date   Arthritis    Cancer ([place])    lymphoma-right hip   Deaf    GERD (gastroesophageal reflux disease)    Hypertension    NOT ON MED AT PRESENT    Past Surgical History:  Procedure Laterality Date   CARDIAC SURGERY  1993   CORONARY ARTERY BYPASS GRAFT     DOUBLE   CYST REMOVAL HAND Right    ENDOBRONCHIAL ULTRASOUND N/A 05/30/2018   Procedure: ENDOBRONCHIAL ULTRASOUND;  Surgeon: Tyler Pita, MD;  Location: [facility] ORS;  Service: Cardiopulmonary;  Laterality: N/A;   PERIPHERAL VASCULAR CATHETERIZATION N/A 04/06/2016   Procedure: Glori Luis Cath Insertion;  Surgeon: Algernon Huxley, MD;  Location: [place];  Service: Cardiovascular;  Laterality: N/A;    Family History  Problem Relation Age of Onset   Lung cancer Mother    Lung cancer Father    Diabetes  Maternal Uncle    Myasthenia gravis Maternal Grandmother    Leukemia Maternal Grandfather     Social History:  reports that she quit smoking about 3 years ago. Her smoking use included cigarettes. She has a 80.00 pack-year smoking history. She has never used smokeless tobacco. She reports current alcohol use. She reports that she does not use drugs. She smoked 1 1/2 packs per day for 20 years (30 pack year smoking history) until 10/2015. She is hearing impaired (deaf). She lives in [place] with her boyfriend. Her daughter lives in [place]. Her daughter, [name]'s contact number is [phone]. She is living with her son. The patient is accompanied by the interpreter Clarissa [phone] and her daughter over the phone today.   Allergies: No Known Allergies  Current Medications: Current Outpatient Medications  Medication Sig Dispense Refill   acetaminophen (TYLENOL) 500 MG tablet Take 500 mg by mouth every 6 (six) hours as needed for moderate pain.      allopurinol (ZYLOPRIM) 300 MG tablet TAKE 1 TABLET BY MOUTH EVERY DAY 90 tablet 0   aspirin EC 81 MG tablet Take 81 mg by mouth daily.     atorvastatin (LIPITOR) 80 MG tablet Take 80 mg by mouth every evening.      b complex vitamins tablet Take 1 tablet by mouth daily.     Cholecalciferol (VITAMIN D3) 2000 units capsule Take 2,000 Units by mouth daily.  DULoxetine (CYMBALTA) 30 MG capsule Take 1 capsule (78m) daily for 7 days then increase to 2 capsules (62m daily.     ferrous sulfate 325 (65 FE) MG tablet Take 650 mg by mouth daily with breakfast.     gabapentin (NEURONTIN) 100 MG capsule Take 2 capsules (200 mg total) by mouth at bedtime. 60 capsule 3   lidocaine-prilocaine (EMLA) cream Apply cream 1 hour before chemotherapy treatment and place small peive of saran wrap over cream to protect clothing 30 g 3   ondansetron (ZOFRAN ODT) 4 MG disintegrating tablet Take 1 tablet (4 mg total) by mouth every 8 (eight) hours as  needed for nausea or vomiting. 30 tablet 1   pantoprazole (PROTONIX) 40 MG tablet Take 40 mg by mouth daily.      vitamin B-12 (CYANOCOBALAMIN) 500 MCG tablet Take 500 mcg by mouth daily.     vitamin E 400 UNIT capsule Take 400 Units by mouth daily.      diclofenac sodium (VOLTAREN) 1 % GEL APPLY 2 G TOPICALLY 3 (THREE) TIMES DAILY AS NEEDED (LEFT KNEE). (Patient not taking: Reported on 05/21/2019) 100 g 1   No current facility-administered medications for this visit.    Facility-Administered Medications Ordered in Other Visits  Medication Dose Route Frequency Provider Last Rate Last Dose   heparin lock flush 100 unit/mL  500 Units Intravenous Once Claxton Levitz C, MD       heparin lock flush 100 unit/mL  500 Units Intravenous Once Laquisha Northcraft C, MD       sodium chloride flush (NS) 0.9 % injection 10 mL  10 mL Intravenous PRN CoNolon Stalls, MD   10 mL at 04/05/17 1034   sodium chloride flush (NS) 0.9 % injection 10 mL  10 mL Intravenous PRN CoNolon Stalls, MD   10 mL at 02/27/19 1033   sodium chloride flush (NS) 0.9 % injection 10 mL  10 mL Intravenous PRN CoLequita AsalMD        Review of Systems  Constitutional: Positive for weight loss (1 pound). Negative for chills, diaphoresis, fever and malaise/fatigue.       Feels "pretty good".  HENT: Positive for hearing loss (deaf). Negative for congestion, ear pain, nosebleeds, sinus pain and sore throat.   Eyes: Negative.  Negative for blurred vision, double vision, photophobia and pain.  Respiratory: Negative.  Negative for cough, hemoptysis, sputum production and shortness of breath.   Cardiovascular: Negative.  Negative for chest pain, palpitations, orthopnea and leg swelling.  Gastrointestinal: Negative.  Negative for abdominal pain, blood in stool, constipation, diarrhea, heartburn, melena, nausea and vomiting.       Eating well.  Genitourinary: Negative.  Negative for dysuria, flank pain, frequency,  hematuria and urgency.  Musculoskeletal: Positive for joint pain. Negative for back pain, myalgias and neck pain.       Bilateral knee pain.  Skin: Negative.  Negative for itching and rash.  Neurological: Positive for sensory change (heel feels numb). Negative for dizziness, tremors, speech change, focal weakness, weakness and headaches.  Endo/Heme/Allergies: Negative.  Does not bruise/bleed easily.  Psychiatric/Behavioral: Negative.  Negative for depression, hallucinations and memory loss. The patient is not nervous/anxious and does not have insomnia.    Performance status (ECOG):  1-2   Vitals Blood pressure 133/82, pulse 92, temperature 98.1 F (36.7 C), temperature source Oral, resp. rate 20, weight 175 lb 3.2 oz (79.5 kg), SpO2 97 %.   Physical Exam  Constitutional: She is oriented to  person, place, and time. Vital signs are normal. She appears well-developed and well-nourished.  Patient sitting comfortably in a wheelchair in no acute distress.  HENT:  Head: Normocephalic and atraumatic.  Curly short gray hair.  Mask.  Eyes: Pupils are equal, round, and reactive to light. Conjunctivae and EOM are normal. No scleral icterus.  Hazel eyes.  Neck: Normal range of motion. Neck supple. No JVD present.  Cardiovascular: Normal rate, regular rhythm and normal heart sounds.  No murmur heard. Pulmonary/Chest: Effort normal and breath sounds normal. No respiratory distress. She has no wheezes. She has no rales.  Abdominal: Soft. Bowel sounds are normal. She exhibits no distension and no mass. There is no abdominal tenderness. There is no rebound and no guarding.  Musculoskeletal: Normal range of motion.        General: No tenderness or edema.     Left knee: She exhibits no swelling and no erythema.     Comments: No knee effusions.  Lymphadenopathy:    She has no cervical adenopathy.  Neurological: She is alert and oriented to person, place, and time. No cranial nerve deficit. She exhibits  normal muscle tone.  Skin: Skin is warm and dry. No rash noted. She is not diaphoretic. No pallor.  Psychiatric: She has a normal mood and affect. Her behavior is normal. Judgment and thought content normal.  Nursing note and vitals reviewed.   Infusion on 05/21/2019  Component Date Value Ref Range Status   LDH 05/21/2019 131  98 - 192 U/L Final   Performed at [facility], [address], [place], [place] [address]   Sodium 05/21/2019 135  135 - 145 mmol/L Final   Potassium 05/21/2019 4.0  3.5 - 5.1 mmol/L Final   Chloride 05/21/2019 103  98 - 111 mmol/L Final   CO2 05/21/2019 23  22 - 32 mmol/L Final   Glucose, Bld 05/21/2019 124* 70 - 99 mg/dL Final   BUN 05/21/2019 24* 8 - 23 mg/dL Final   Creatinine, Ser 05/21/2019 0.82  0.44 - 1.00 mg/dL Final   Calcium 05/21/2019 8.8* 8.9 - 10.3 mg/dL Final   Total Protein 05/21/2019 7.0  6.5 - 8.1 g/dL Final   Albumin 05/21/2019 3.3* 3.5 - 5.0 g/dL Final   AST 05/21/2019 21  15 - 41 U/L Final   ALT 05/21/2019 15  0 - 44 U/L Final   Alkaline Phosphatase 05/21/2019 73  38 - 126 U/L Final   Total Bilirubin 05/21/2019 0.9  0.3 - 1.2 mg/dL Final   GFR calc non Af Amer 05/21/2019 >60  >60 mL/min Final   GFR calc Af Amer 05/21/2019 >60  >60 mL/min Final   Anion gap 05/21/2019 9  5 - 15 Final   Performed at [facility], [address] [place], [place] [address]   WBC 05/21/2019 9.3  4.0 - 10.5 K/uL Final   RBC 05/21/2019 3.91  3.87 - 5.11 MIL/uL Final   Hemoglobin 05/21/2019 13.0  12.0 - 15.0 g/dL Final   HCT 05/21/2019 38.3  36.0 - 46.0 % Final   MCV 05/21/2019 98.0  80.0 - 100.0 fL Final   MCH 05/21/2019 33.2  26.0 - 34.0 pg Final   MCHC 05/21/2019 33.9  30.0 - 36.0 g/dL Final   RDW 05/21/2019 15.0  11.5 - 15.5 % Final   Platelets 05/21/2019 287  150 - 400 K/uL Final   nRBC 05/21/2019 0.0  0.0 - 0.2 % Final   Neutrophils Relative % 05/21/2019 68  %  Final   Neutro Abs 05/21/2019 6.4   1.7 - 7.7 K/uL Final   Lymphocytes Relative 05/21/2019 17  % Final   Lymphs Abs 05/21/2019 1.6  0.7 - 4.0 K/uL Final   Monocytes Relative 05/21/2019 11  % Final   Monocytes Absolute 05/21/2019 1.0  0.1 - 1.0 K/uL Final   Eosinophils Relative 05/21/2019 2  % Final   Eosinophils Absolute 05/21/2019 0.1  0.0 - 0.5 K/uL Final   Basophils Relative 05/21/2019 1  % Final   Basophils Absolute 05/21/2019 0.1  0.0 - 0.1 K/uL Final   Immature Granulocytes 05/21/2019 1  % Final   Abs Immature Granulocytes 05/21/2019 0.07  0.00 - 0.07 K/uL Final   Performed at [facility], [address] [place], [place] [address]    Assessment:  Toni Griffith is a 78 y.o. female stage IVB B-cell lymphoma, unclassifiable, with features intermediate between diffuse large B-cell lymphoma and classical Hodgkin's lymphoma ("gray zone" lymphoma). She underwent right iliac wing bone/bone marrow biopsy on 03/08/2016.  PET scanon 03/03/2016 revealed a hypermetabolic left lower lobe mass, bilateral pleural and pulmonary parenchymal nodular metastasis, a large pleural lesion invading the anterior left chest wall. There was activity in the superior left ocular orbit. There was a large lesion centered in the right iliac wing with soft tissue extension. There were hypermetabolic right external iliac lymph nodes  [place] 04/04/2016 revealed an EF of 55-60%. Hepatitis B and C testing was negative on 03/30/2016. G6PD assay was normal. She declined LP with MTX prophylaxis.  She received 6 cycles of mini-RCHOP(04/07/2016 - 07/21/2016). She has had some transient fingertip numbness.  She received radiationto the right iliac wing, from 10/11/2016 - 11/01/2016. She received 3000 cGy over 3 weeks.  PET scanon 01/29/2017 revealed interval progression of hypermetabolic nodules in the right upper (13 mm compared to 9 mm; SUV 10) and left lower lobes (26 mm compared to 22 mm; SUV 7.5). There  was new hypermetabolic focus of activity along the anterior left pleura although no underlying pleural or lung mass was evident.  She has squamous cell lung cancer. [facility] One[facility] the lung biopsy on 03/15/2018 revealed MS-stable, tumor mutational burden 5 Muts/Mb, AKT2 amplification, CCNE1 amplification, CREBBP R1446L, KDR amplification, KIT amplification, PDGFRA amplification, PTEN loss exons 2-4, and OX73 splice site [id number]>M. Marland Kitchen There were no alterations in EGFR, KRAS, RET, ALK, MET, ERBB2, BRAF, ROS1. PDL-1IHC analysis revealed a TPS score of 50%.  CT guided left lower lobe nodule biopsyon 02/19/2017 confirmed squamous cell carcinoma. She underwent SBRT08/27/2018 - 04/09/2017. She received 5000 cGy in 5 fractions to the left lower lobe lesion. She underwent SBRTto a RUL lesion from 05/23/2017 - 06/18/2017.  CT guided left upper lobe nodule biopsyon 02/20/2018 revealedsquamous cell lung cancer. She received SBRTfrom 04/09/2018 - 04/15/2018. She received 6000 cGy in 5 fractions.   Bronchoscopyon 05/30/2018 revealed no endobronchial lesions. The 2 cm pre-carinal lymph node was biopsied. Pathology revealed metastatic non-small cell carcinoma, favor squamous cell carcinoma.  PET scanon 09/18/2017 revealed a mixed appearance, with marked improvement in the prior 2 pulmonary nodules; but with new Deauville 5 left common iliac lymph node enlargement; new Deauville 5 right paraspinal lesion; and new Deauville 4 left infrahilar nodal activity. There was a focus of accentuated metabolic activity which had been present along the right lower submandibular gland but currently seemed to project over the subcutaneous tissues without CT correlate. Current maximum SUV 10.5, previously 9.7. There was increased accentuated activity along the left lateral  spurring at L2-3. While typically such activity is inflammatory due to spurring, this had increased significantly since the prior exam and  merit surveillance. There was a new 8 by 5 mm right upper lobe pulmonary nodule, Deauville 2.  CT guided right paraspinal lesion biopsyon 10/16/2017 was compatible with involvement by the patient's previously diagnosed lymphoma. The case was referred to [facility] hematopathology. The neoplastic cells were largely CD30 negative. Repeat CD30 immunohistochemistry faintly stained >10% of the large atypical cells. CD20 stained the scattered large atypical cells and few small cells.   She received 3 cycles of brentuximab vedotin(11/23/2017 - 01/11/2018).  PET scan on 01/29/2018 revealed persistent and progressive areas of hypermetabolism within the right submandibular gland, lingula of the left lung, left common iliac nodal chain, and right lumbar paraspinal soft tissues. Findings were compatible with Deauville criteria 4 and 5. There was a new nodule is identified within the lingula which demonstrated intense uptake compatible with Deauville criteria 5.   Chest, abdomen, and pelvisCTon 05/14/2018 revealed enlarging mediastinal adenopathy and right upper lobe nodule, indicative of disease progression. Necrotic lingular nodules were grossly stable in size with developing surrounding collapse/consolidation, possibly treatment related. There was slight decrease in size of a left external iliac lymph node and right lumbar paraspinal soft tissue lesion. There was a tiny left pleural effusion.  Head MRIon 06/24/2018 revealed no evidence of intracranial metastasis.  She is s/p13cycles of pembrolizumab(06/14/2018 - 04/30/2019). She received a steroid Dosepak on 01/09/2019 for minor chest CT changes felt secondary to pembrolizumab.  Chest,abdomen, andpelvisCTon 12/26/2018 revealed interval increase in dense consolidation of perihilar left lung and lingula and a dense bandlike perifissural consolidation of the inferior right upper lobe, in keeping with evolving post treatment change. Therewas an  unchanged small left pleural effusion. Therewas new, diffusely scattered ground-glass opacity bilaterally, with and an unusual appearance and central clearing, most conspicuous in the right upper lobe.These findings were nonspecific and infectious or inflammatory, and particularly drug toxicity is a differential consideration.There was no change in enlarged mediastinal and AP window lymph nodes.There was no evidence of distant metastatic disease or lymphadenopathy in the abdomen or pelvis.COVID-19 testingwas negative on 01/09/2019.  Chest CTon 02/26/2019 revealed near resolution of diffuse bilateral ground-glass opacity with central predominance in the lungs previously has nearly resolved in the interval. The collapse/consolidative change in the inferior right upper lobe and parahilar left lung was stable. There were atable borderline enlarged prevascular lymph nodes.  She hasiron deficiency anemia. Ferritinhas been followed: 14 on 05/06/2018,23 on 07/26/2018, and 59 on 11/01/2018. She denies any bleeding.  She has a grade I neuropathy. B12 and folate were normal on 12/06/2017. She has severe bilateral knee arthritis.  She has received steroid injections and Monovisc (hyaluronate derivatives) without improvement.   She is deafand requires an interpreter  Symptomatically, she continues to have knee pain.  She denies any fevers, sweats or weight loss.  She denies any respiratory symptoms.  Exam is stable.  Plan: 1.   Labs today:CBC with diff, CMP, LDH, TSH. 2. [place] Cables zone lymphoma Clinically, she continues to do well. She denies any B symptoms. Exam reveals no adenopathy or hepatosplenomegaly. Scans on 12/26/2018 and 02/26/2019 reveal no progressive disease. Review plan for follow-up imaging after this cycle.  Patient in agreement. Unclear if pembrolizumab is causing increased knee pain (see below). She consents to continuation of therapy.    Labs reviewed.  Proceed with  cycle #14 pembrolizumab today. 3. Squamous cell lung cancer Clinically, she continues to do well.  She denies any respiratory symptoms.    Discuss plan for follow-up on bilateral groundglass opacities noted on chest CT from 02/26/2019.    Cycle #14 pembrolizumab today. 4. Ground glass opacities Patient denies any respiratory symptoms.    Lungs are clear to auscultation without rales wheezes or rhonchi.   Await follow-up scan. 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  Continue to monitor. 6. B12 deficiency Patient on oral B12.  B12 and folate were normal on 12/17/2018. 7.Bilateral knee pain  Patient scheduled to see Dr. Harrel Carina, rheumatologist, in 08/2019. She has baseline arthritis which may be exacerbated by pembrolizumab.                         Incidence of arthritis 2%, myalgias 12% and arthralgias 10-18%.     RN to see if appointment can be made sooner with rheumatology. 8.   Schedule chest, abdomen, and pelvic CT on 06/06/2019. 9.   RTC in 3 weeks for MD assessment, labs (CBC with diff, CMP, LDH, uric acid, TSH), review of CT scans, and cycle #15 pembrolizumab.  I discussed the assessment and treatment plan with the patient.  The patient was provided an opportunity to ask questions and all were answered.  The patient agreed with the plan and demonstrated an understanding of the instructions.  The patient was advised to call back if the symptoms worsen or if the condition fails to improve as anticipated.    Lequita Asal, MD, PhD    05/21/2019, 3:05 PM

## 2019-05-21 ENCOUNTER — Inpatient Hospital Stay: Payer: Medicare HMO

## 2019-05-21 ENCOUNTER — Other Ambulatory Visit: Payer: Self-pay

## 2019-05-21 ENCOUNTER — Inpatient Hospital Stay: Payer: Medicare HMO | Attending: Hematology and Oncology | Admitting: Hematology and Oncology

## 2019-05-21 VITALS — BP 133/82 | HR 92 | Temp 98.1°F | Resp 20 | Wt 175.2 lb

## 2019-05-21 VITALS — BP 115/77 | HR 89 | Temp 96.5°F | Resp 18

## 2019-05-21 DIAGNOSIS — M25561 Pain in right knee: Secondary | ICD-10-CM | POA: Diagnosis not present

## 2019-05-21 DIAGNOSIS — M791 Myalgia, unspecified site: Secondary | ICD-10-CM | POA: Insufficient documentation

## 2019-05-21 DIAGNOSIS — G620 Drug-induced polyneuropathy: Secondary | ICD-10-CM | POA: Insufficient documentation

## 2019-05-21 DIAGNOSIS — Z801 Family history of malignant neoplasm of trachea, bronchus and lung: Secondary | ICD-10-CM | POA: Diagnosis not present

## 2019-05-21 DIAGNOSIS — C3432 Malignant neoplasm of lower lobe, left bronchus or lung: Secondary | ICD-10-CM | POA: Diagnosis present

## 2019-05-21 DIAGNOSIS — R911 Solitary pulmonary nodule: Secondary | ICD-10-CM | POA: Diagnosis not present

## 2019-05-21 DIAGNOSIS — C8518 Unspecified B-cell lymphoma, lymph nodes of multiple sites: Secondary | ICD-10-CM

## 2019-05-21 DIAGNOSIS — Z87891 Personal history of nicotine dependence: Secondary | ICD-10-CM | POA: Insufficient documentation

## 2019-05-21 DIAGNOSIS — Z79899 Other long term (current) drug therapy: Secondary | ICD-10-CM | POA: Insufficient documentation

## 2019-05-21 DIAGNOSIS — Z806 Family history of leukemia: Secondary | ICD-10-CM | POA: Diagnosis not present

## 2019-05-21 DIAGNOSIS — C8590 Non-Hodgkin lymphoma, unspecified, unspecified site: Secondary | ICD-10-CM

## 2019-05-21 DIAGNOSIS — J90 Pleural effusion, not elsewhere classified: Secondary | ICD-10-CM | POA: Diagnosis not present

## 2019-05-21 DIAGNOSIS — R590 Localized enlarged lymph nodes: Secondary | ICD-10-CM | POA: Insufficient documentation

## 2019-05-21 DIAGNOSIS — H9190 Unspecified hearing loss, unspecified ear: Secondary | ICD-10-CM | POA: Diagnosis not present

## 2019-05-21 DIAGNOSIS — Z8489 Family history of other specified conditions: Secondary | ICD-10-CM | POA: Diagnosis not present

## 2019-05-21 DIAGNOSIS — Z833 Family history of diabetes mellitus: Secondary | ICD-10-CM | POA: Diagnosis not present

## 2019-05-21 DIAGNOSIS — C3490 Malignant neoplasm of unspecified part of unspecified bronchus or lung: Secondary | ICD-10-CM

## 2019-05-21 DIAGNOSIS — C8330 Diffuse large B-cell lymphoma, unspecified site: Secondary | ICD-10-CM | POA: Insufficient documentation

## 2019-05-21 DIAGNOSIS — E538 Deficiency of other specified B group vitamins: Secondary | ICD-10-CM | POA: Insufficient documentation

## 2019-05-21 DIAGNOSIS — M25562 Pain in left knee: Secondary | ICD-10-CM | POA: Insufficient documentation

## 2019-05-21 DIAGNOSIS — M170 Bilateral primary osteoarthritis of knee: Secondary | ICD-10-CM | POA: Diagnosis not present

## 2019-05-21 DIAGNOSIS — Z5112 Encounter for antineoplastic immunotherapy: Secondary | ICD-10-CM | POA: Diagnosis present

## 2019-05-21 DIAGNOSIS — T451X5A Adverse effect of antineoplastic and immunosuppressive drugs, initial encounter: Secondary | ICD-10-CM | POA: Insufficient documentation

## 2019-05-21 DIAGNOSIS — G8929 Other chronic pain: Secondary | ICD-10-CM

## 2019-05-21 LAB — CBC WITH DIFFERENTIAL/PLATELET
Abs Immature Granulocytes: 0.07 10*3/uL (ref 0.00–0.07)
Basophils Absolute: 0.1 10*3/uL (ref 0.0–0.1)
Basophils Relative: 1 %
Eosinophils Absolute: 0.1 10*3/uL (ref 0.0–0.5)
Eosinophils Relative: 2 %
HCT: 38.3 % (ref 36.0–46.0)
Hemoglobin: 13 g/dL (ref 12.0–15.0)
Immature Granulocytes: 1 %
Lymphocytes Relative: 17 %
Lymphs Abs: 1.6 10*3/uL (ref 0.7–4.0)
MCH: 33.2 pg (ref 26.0–34.0)
MCHC: 33.9 g/dL (ref 30.0–36.0)
MCV: 98 fL (ref 80.0–100.0)
Monocytes Absolute: 1 10*3/uL (ref 0.1–1.0)
Monocytes Relative: 11 %
Neutro Abs: 6.4 10*3/uL (ref 1.7–7.7)
Neutrophils Relative %: 68 %
Platelets: 287 10*3/uL (ref 150–400)
RBC: 3.91 MIL/uL (ref 3.87–5.11)
RDW: 15 % (ref 11.5–15.5)
WBC: 9.3 10*3/uL (ref 4.0–10.5)
nRBC: 0 % (ref 0.0–0.2)

## 2019-05-21 LAB — COMPREHENSIVE METABOLIC PANEL
ALT: 15 U/L (ref 0–44)
AST: 21 U/L (ref 15–41)
Albumin: 3.3 g/dL — ABNORMAL LOW (ref 3.5–5.0)
Alkaline Phosphatase: 73 U/L (ref 38–126)
Anion gap: 9 (ref 5–15)
BUN: 24 mg/dL — ABNORMAL HIGH (ref 8–23)
CO2: 23 mmol/L (ref 22–32)
Calcium: 8.8 mg/dL — ABNORMAL LOW (ref 8.9–10.3)
Chloride: 103 mmol/L (ref 98–111)
Creatinine, Ser: 0.82 mg/dL (ref 0.44–1.00)
GFR calc Af Amer: >60 mL/min (ref >60)
GFR calc non Af Amer: >60 mL/min (ref >60)
Glucose, Bld: 124 mg/dL — ABNORMAL HIGH (ref 70–99)
Potassium: 4 mmol/L (ref 3.5–5.1)
Sodium: 135 mmol/L (ref 135–145)
Total Bilirubin: 0.9 mg/dL (ref 0.3–1.2)
Total Protein: 7 g/dL (ref 6.5–8.1)

## 2019-05-21 LAB — LACTATE DEHYDROGENASE: LDH: 131 U/L (ref 98–192)

## 2019-05-21 MED ORDER — HEPARIN SOD (PORK) LOCK FLUSH 100 UNIT/ML IV SOLN
500.0000 [IU] | Freq: Once | INTRAVENOUS | Status: AC
  Administered 2019-05-21: 500 [IU] via INTRAVENOUS
  Filled 2019-05-21: qty 5

## 2019-05-21 MED ORDER — ONDANSETRON HCL 4 MG PO TABS
8.0000 mg | ORAL_TABLET | Freq: Once | ORAL | Status: AC
  Administered 2019-05-21: 8 mg via ORAL

## 2019-05-21 MED ORDER — SODIUM CHLORIDE 0.9% FLUSH
10.0000 mL | INTRAVENOUS | Status: DC | PRN
  Administered 2019-05-21: 10 mL via INTRAVENOUS
  Filled 2019-05-21: qty 10

## 2019-05-21 MED ORDER — SODIUM CHLORIDE 0.9 % IV SOLN
Freq: Once | INTRAVENOUS | Status: AC
  Administered 2019-05-21: 16:00:00 via INTRAVENOUS
  Filled 2019-05-21: qty 250

## 2019-05-21 MED ORDER — SODIUM CHLORIDE 0.9 % IV SOLN
200.0000 mg | Freq: Once | INTRAVENOUS | Status: AC
  Administered 2019-05-21: 200 mg via INTRAVENOUS
  Filled 2019-05-21: qty 8

## 2019-05-21 NOTE — Progress Notes (Signed)
Patient here for follow up. Denies any concerns.  

## 2019-05-22 LAB — TSH: TSH: 0.711 u[IU]/mL (ref 0.350–4.500)

## 2019-06-06 ENCOUNTER — Other Ambulatory Visit: Payer: Self-pay

## 2019-06-06 ENCOUNTER — Ambulatory Visit
Admission: RE | Admit: 2019-06-06 | Discharge: 2019-06-06 | Disposition: A | Discharge status: Home / Self Care | Payer: Medicare HMO | Source: Ambulatory Visit | Attending: Hematology and Oncology | Admitting: Hematology and Oncology

## 2019-06-06 DIAGNOSIS — C8518 Unspecified B-cell lymphoma, lymph nodes of multiple sites: Secondary | ICD-10-CM | POA: Diagnosis present

## 2019-06-06 DIAGNOSIS — C3490 Malignant neoplasm of unspecified part of unspecified bronchus or lung: Secondary | ICD-10-CM | POA: Insufficient documentation

## 2019-06-06 HISTORY — DX: Malignant neoplasm of unspecified part of unspecified bronchus or lung: C34.90

## 2019-06-06 MED ORDER — IOHEXOL 300 MG/ML  SOLN
100.0000 mL | Freq: Once | INTRAMUSCULAR | Status: AC | PRN
  Administered 2019-06-06: 10:00:00 100 mL via INTRAVENOUS

## 2019-06-09 NOTE — Progress Notes (Signed)
[facility]  [address] [place], [place] [address] Phone: [phone]  Fax: [phone]   Clinic Day:  06/11/2019  Referring physician: Francesca Oman, DO  Chief Complaint: Toni Griffith is a 78 y.o. 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  HPI: The patient was last seen in the medical oncology clinic on 05/21/2019. At that time, she continued to have knee pain. She denied any fevers, sweats or weight loss. She denied any respiratory symptoms. Exam was stable. Hematocrit was 38.3, hemoglobin 13.0, platelets 287,000, and WBC 9300.  LDH was 131. TSH was 0.711. She received cycle #14 pembrolizumab.   Chest, abdomen, and pelvis CT on 06/06/2019 revealed an interval mass-like opacity in the superior aspect of the left parahilar lung with an interval more prominent mass-like component involving the pleura laterally at the level with slightly more prominent extension into the left hila region, suspicious for recurrent or progressive lung cancer.  There was mediastinal and bilateral hilar adenopathy c/w metastatic adenopathy or involvement with the patient's B-cell lymphoma. There was no evidence of metastatic disease or lymphoma involving the abdomen or pelvis. There was significantly improved ground-glass opacities in the right lung, minimal left pleural fluid (improved), and a stable large hiatal hernia. There was stable enlarged thyroid gland containing heterogeneous nodules with substernal extension on the left.  There was stable to slightly decreased size of the previously demonstrated right adrenal nodule and no significant change in a small, poorly visualized left adrenal nodule.  During the interim, she has felt fine. She has knee pain. She has not seen the rheumatologist. She has numbness in her heels. She notes feeling like something is "squishy on the  inside of my feet while I walk".  Her breathing is fine. Her daughter notes that she gets winded easily when walking.   Her daughter is concerned about the results of her scans. I discussed a PET scan and biopsy with the patient and her family. The patient and her family are understanding. The patient feels nervous about her procedure and is scheduled to see Dr. Patsey Berthold on 06/19/2019.    Past Medical History:  Diagnosis Date   Arthritis    Cancer ([place])    lymphoma-right hip   Deaf    GERD (gastroesophageal reflux disease)    Hypertension    NOT ON MED AT PRESENT   Squamous cell lung cancer ([place])     Past Surgical History:  Procedure Laterality Date   CARDIAC SURGERY  1993   CORONARY ARTERY BYPASS GRAFT     DOUBLE   CYST REMOVAL HAND Right    ENDOBRONCHIAL ULTRASOUND N/A 05/30/2018   Procedure: ENDOBRONCHIAL ULTRASOUND;  Surgeon: Tyler Pita, MD;  Location: [facility] ORS;  Service: Cardiopulmonary;  Laterality: N/A;   PERIPHERAL VASCULAR CATHETERIZATION N/A 04/06/2016   Procedure: Glori Luis Cath Insertion;  Surgeon: Algernon Huxley, MD;  Location: [facility];  Service: Cardiovascular;  Laterality: N/A;    Family History  Problem Relation Age of Onset   Lung cancer Mother    Lung cancer Father    Diabetes Maternal Uncle    Myasthenia gravis Maternal Grandmother    Leukemia Maternal Grandfather     Social History:  reports that she quit smoking about 3 years ago. Her smoking use included cigarettes. She has a 80.00 pack-year smoking history. She has never used smokeless tobacco. She  reports current alcohol use. She reports that she does not use drugs. She smoked 1 1/2 packs per day for 20 years (30 pack year smoking history) until 10/2015. She is hearing impaired (deaf). She lives in [place] with her boyfriend. Her daughter lives in [place]. Her daughter, [name]'s contact number is [phone]. She is living with her son. The patient is accompanied by Margaretha Sheffield  the interpreter, and son, and her daughter (via face time) today.  Allergies: No Known Allergies  Current Medications: Current Outpatient Medications  Medication Sig Dispense Refill   acetaminophen (TYLENOL) 500 MG tablet Take 500 mg by mouth every 6 (six) hours as needed for moderate pain.      allopurinol (ZYLOPRIM) 300 MG tablet TAKE 1 TABLET BY MOUTH EVERY DAY 90 tablet 0   aspirin EC 81 MG tablet Take 81 mg by mouth daily.     atorvastatin (LIPITOR) 80 MG tablet Take 80 mg by mouth every evening.      b complex vitamins tablet Take 1 tablet by mouth daily.     Cholecalciferol (VITAMIN D3) 2000 units capsule Take 2,000 Units by mouth daily.     DULoxetine (CYMBALTA) 30 MG capsule Take 1 capsule ('30mg'$ ) daily for 7 days then increase to 2 capsules ('60mg'$ ) daily.     ferrous sulfate 325 (65 FE) MG tablet Take 650 mg by mouth daily with breakfast.     gabapentin (NEURONTIN) 100 MG capsule Take 2 capsules (200 mg total) by mouth at bedtime. 60 capsule 3   lidocaine-prilocaine (EMLA) cream Apply cream 1 hour before chemotherapy treatment and place small peive of saran wrap over cream to protect clothing 30 g 3   ondansetron (ZOFRAN ODT) 4 MG disintegrating tablet Take 1 tablet (4 mg total) by mouth every 8 (eight) hours as needed for nausea or vomiting. 30 tablet 1   pantoprazole (PROTONIX) 40 MG tablet Take 40 mg by mouth daily.      vitamin B-12 (CYANOCOBALAMIN) 500 MCG tablet Take 500 mcg by mouth daily.     vitamin E 400 UNIT capsule Take 400 Units by mouth daily.      No current facility-administered medications for this visit.    Facility-Administered Medications Ordered in Other Visits  Medication Dose Route Frequency Provider Last Rate Last Dose   heparin lock flush 100 unit/mL  500 Units Intravenous Once Ludwin Flahive C, MD       heparin lock flush 100 unit/mL  500 Units Intravenous Once Jaima Janney C, MD       sodium chloride flush (NS) 0.9 % injection 10  mL  10 mL Intravenous PRN Mike Gip, Zanita Millman C, MD   10 mL at 04/05/17 1034   sodium chloride flush (NS) 0.9 % injection 10 mL  10 mL Intravenous PRN Nolon Stalls C, MD   10 mL at 02/27/19 1033   sodium chloride flush (NS) 0.9 % injection 10 mL  10 mL Intravenous PRN Lequita Asal, MD   10 mL at 06/11/19 0902    Review of Systems  Constitutional: Positive for weight loss (2 pounds). Negative for chills, diaphoresis, fever and malaise/fatigue.       Feels "fine".  HENT: Positive for hearing loss (deaf). Negative for congestion, ear pain, nosebleeds, sinus pain and sore throat.   Eyes: Negative.  Negative for blurred vision, double vision, photophobia and pain.  Respiratory: Positive for shortness of breath (on exertion). Negative for cough, hemoptysis and sputum production.   Cardiovascular: Negative.  Negative for  chest pain, palpitations, orthopnea and leg swelling.  Gastrointestinal: Negative.  Negative for abdominal pain, blood in stool, constipation, diarrhea, heartburn, melena, nausea and vomiting.  Genitourinary: Negative.  Negative for dysuria, flank pain, frequency, hematuria and urgency.  Musculoskeletal: Positive for joint pain (knees). Negative for back pain, myalgias and neck pain.  Skin: Negative.  Negative for itching and rash.  Neurological: Positive for sensory change (heel feels numb- stable). Negative for dizziness, tremors, speech change, focal weakness, weakness and headaches.  Endo/Heme/Allergies: Negative.  Does not bruise/bleed easily.  Psychiatric/Behavioral: Negative for depression, hallucinations and memory loss. The patient is nervous/anxious (thinking about a biopsy). The patient does not have insomnia.   All other systems reviewed and are negative.  Performance status (ECOG): 1  Vitals Blood pressure 132/86, pulse 98, temperature 97.8 F (36.6 C), temperature source Oral, resp. rate 18, weight 173 lb 4.8 oz (78.6 kg), SpO2 97 %.  Physical Exam    Constitutional: She is oriented to person, place, and time. Vital signs are normal. She appears well-developed and well-nourished. No distress.  Patient is examined in a wheelchair.  HENT:  Head: Normocephalic and atraumatic.  Mouth/Throat: Oropharynx is clear and moist. No oropharyngeal exudate.  Curly short gray hair.  Mask.  Eyes: Pupils are equal, round, and reactive to light. Conjunctivae and EOM are normal. No scleral icterus.  Hazel eyes.  Neck: Normal range of motion. Neck supple. No JVD present.  Cardiovascular: Normal rate, regular rhythm and normal heart sounds.  No murmur heard. Pulmonary/Chest: Effort normal and breath sounds normal. No respiratory distress. She has no wheezes. She has no rales.  Abdominal: Soft. Bowel sounds are normal. She exhibits no distension and no mass. There is no abdominal tenderness. There is no rebound and no guarding.  Musculoskeletal:        General: No tenderness or edema.     Left knee: She exhibits no swelling and no erythema.  Lymphadenopathy:       Head (right side): No preauricular, no posterior auricular and no occipital adenopathy present.       Head (left side): No preauricular, no posterior auricular and no occipital adenopathy present.    She has no cervical adenopathy.    She has no axillary adenopathy.       Right: No inguinal adenopathy present.       Left: No inguinal and no supraclavicular adenopathy present.  Neurological: She is alert and oriented to person, place, and time. No cranial nerve deficit.  Skin: Skin is warm and dry. No rash noted. She is not diaphoretic. No erythema. No pallor.  Psychiatric: She has a normal mood and affect. Her behavior is normal. Judgment and thought content normal.  Nursing note and vitals reviewed.   Imaging studies: 03/03/2016:  PET scanrevealed a hypermetabolic left lower lobe mass, bilateral pleural and pulmonary parenchymal nodular metastasis, a large pleural lesion invading the  anterior left chest wall. There was activity in the superior left ocular orbit. There was a large lesion centered in the right iliac wing with soft tissue extension. There were hypermetabolic right external iliac lymph nodes 01/29/2017:  PET scanrevealed interval progression of hypermetabolic nodules in the right upper (13 mm compared to 9 mm; SUV 10) and left lower lobes (26 mm compared to 22 mm; SUV 7.5). There was new hypermetabolic focus of activity along the anterior left pleura although no underlying pleural or lung mass was evident. 09/18/2017:  PET scanrevealed a mixed appearance, with marked improvement in the prior  2 pulmonary nodules; but with new Deauville 5 left common iliac lymph node enlargement; new Deauville 5 right paraspinal lesion; and new Deauville 4 left infrahilar nodal activity. There was a focus of accentuated metabolic activity which had been present along the right lower submandibular gland but currently seemed to project over the subcutaneous tissues without CT correlate. Current maximum SUV 10.5, previously 9.7. There was increased accentuated activity along the left lateral spurring at L2-3. While typically such activity is inflammatory due to spurring, this had increased significantly since the prior exam and merit surveillance. There was a new 8 by 5 mm right upper lobe pulmonary nodule, Deauville 2. 01/29/2018:  PET scan revealed persistent and progressive areas of hypermetabolism within the right submandibular gland, lingula of the left lung, left common iliac nodal chain, and right lumbar paraspinal soft tissues. Findings were compatible with Deauville criteria 4 and 5. There was a new nodule is identified within the lingula which demonstrated intense uptake compatible with Deauville criteria 5.  05/14/2018:  Chest, abdomen, and pelvisCTrevealed enlarging mediastinal adenopathy and right upper lobe nodule, indicative of disease progression. Necrotic lingular nodules  were grossly stable in size with developing surrounding collapse/consolidation, possibly treatment related. There was slight decrease in size of a left external iliac lymph node and right lumbar paraspinal soft tissue lesion. There was a tiny left pleural effusion. 06/24/2018:  Head MRIrevealed no evidence of intracranial metastasis. 12/26/2018:  Chest,abdomen, andpelvisCTrevealed interval increase in dense consolidation of perihilar left lung and lingula and a dense bandlike perifissural consolidation of the inferior right upper lobe, in keeping with evolving post treatment change. Therewas an unchanged small left pleural effusion. Therewas new, diffusely scattered ground-glass opacity bilaterally, with and an unusual appearance and central clearing, most conspicuous in the right upper lobe.These findings were nonspecific and infectious or inflammatory, and particularly drug toxicity is a differential consideration.There was no change in enlarged mediastinal and AP window lymph nodes.There was no evidence of distant metastatic disease or lymphadenopathy in the abdomen or pelvis.COVID-19 testingwas negative on 01/09/2019. 02/26/2019:  Chest CTrevealed near resolution of diffuse bilateral ground-glass opacity with central predominance in the lungs previously has nearly resolved in the interval. The collapse/consolidative change in the inferior right upper lobe and parahilar left lung was stable. There were atable borderline enlarged prevascular lymph nodes. 06/06/2019:  Chest, abdomen, and pelvis CT revealed an interval mass-like opacity in the superior aspect of the left parahilar lung with an interval more prominent mass-like component involving the pleura laterally at the level with slightly more prominent extension into the left hila region, suspicious for recurrent or progressive lung cancer.  There was mediastinal and bilateral hilar adenopathy c/w metastatic adenopathy or involvement with  the patient's B-cell lymphoma. There was no evidence of metastatic disease or lymphoma involving the abdomen or pelvis. There was significantly improved ground-glass opacities in the right lung, minimal left pleural fluid (improved), and a stable large hiatal hernia. There was stable enlarged thyroid gland containing heterogeneous nodules with substernal extension on the left.  There was stable to slightly decreased size of the previously demonstrated right adrenal nodule and no significant change in a small, poorly visualized left adrenal nodule.   Infusion on 06/11/2019  Component Date Value Ref Range Status   WBC 06/11/2019 8.7  4.0 - 10.5 K/uL Final   RBC 06/11/2019 4.03  3.87 - 5.11 MIL/uL Final   Hemoglobin 06/11/2019 13.2  12.0 - 15.0 g/dL Final   HCT 06/11/2019 38.8  36.0 - 46.0 % Final  MCV 06/11/2019 96.3  80.0 - 100.0 fL Final   MCH 06/11/2019 32.8  26.0 - 34.0 pg Final   MCHC 06/11/2019 34.0  30.0 - 36.0 g/dL Final   RDW 06/11/2019 14.6  11.5 - 15.5 % Final   Platelets 06/11/2019 319  150 - 400 K/uL Final   nRBC 06/11/2019 0.0  0.0 - 0.2 % Final   Neutrophils Relative % 06/11/2019 65  % Final   Neutro Abs 06/11/2019 5.7  1.7 - 7.7 K/uL Final   Lymphocytes Relative 06/11/2019 18  % Final   Lymphs Abs 06/11/2019 1.5  0.7 - 4.0 K/uL Final   Monocytes Relative 06/11/2019 12  % Final   Monocytes Absolute 06/11/2019 1.1* 0.1 - 1.0 K/uL Final   Eosinophils Relative 06/11/2019 3  % Final   Eosinophils Absolute 06/11/2019 0.3  0.0 - 0.5 K/uL Final   Basophils Relative 06/11/2019 1  % Final   Basophils Absolute 06/11/2019 0.1  0.0 - 0.1 K/uL Final   Immature Granulocytes 06/11/2019 1  % Final   Abs Immature Granulocytes 06/11/2019 0.06  0.00 - 0.07 K/uL Final   Performed at [facility], [address], [place], [place] [address]   Sodium 06/11/2019 135  135 - 145 mmol/L Final   Potassium 06/11/2019 3.7  3.5 - 5.1 mmol/L Final   Chloride  06/11/2019 103  98 - 111 mmol/L Final   CO2 06/11/2019 23  22 - 32 mmol/L Final   Glucose, Bld 06/11/2019 116* 70 - 99 mg/dL Final   BUN 06/11/2019 16  8 - 23 mg/dL Final   Creatinine, Ser 06/11/2019 0.89  0.44 - 1.00 mg/dL Final   Calcium 06/11/2019 8.9  8.9 - 10.3 mg/dL Final   Total Protein 06/11/2019 7.2  6.5 - 8.1 g/dL Final   Albumin 06/11/2019 3.3* 3.5 - 5.0 g/dL Final   AST 06/11/2019 22  15 - 41 U/L Final   ALT 06/11/2019 13  0 - 44 U/L Final   Alkaline Phosphatase 06/11/2019 72  38 - 126 U/L Final   Total Bilirubin 06/11/2019 1.2  0.3 - 1.2 mg/dL Final   GFR calc non Af Amer 06/11/2019 >60  >60 mL/min Final   GFR calc Af Amer 06/11/2019 >60  >60 mL/min Final   Anion gap 06/11/2019 9  5 - 15 Final   Performed at [facility], [address], [place], [place] [address]   LDH 06/11/2019 140  98 - 192 U/L Final   Performed at [facility], [address], [place], [place] [address]   Uric Acid, Serum 06/11/2019 3.5  2.5 - 7.1 mg/dL Final   Performed at [facility], [address], [place], [place] [address]    Assessment:  Amenah Tucci is a 78 y.o. female with stage IVB B-cell lymphoma, unclassifiable, with features intermediate between diffuse large B-cell lymphoma and classical Hodgkin's lymphoma ("gray zone" lymphoma). She underwent right iliac wing bone/bone marrow biopsy on 03/08/2016.  PET scanon 03/03/2016 revealed a hypermetabolic left lower lobe mass, bilateral pleural and pulmonary parenchymal nodular metastasis, a large pleural lesion invading the anterior left chest wall. There was activity in the superior left ocular orbit. There was a large lesion centered in the right iliac wing with soft tissue extension. There were hypermetabolic right external iliac lymph nodes  [place] 04/04/2016 revealed an EF of 55-60%. Hepatitis B and C testing was negative on 03/30/2016. G6PD assay was normal. She  declined LP with MTX  prophylaxis.  She received 6 cycles of mini-RCHOP(04/07/2016 - 07/21/2016). She has had some transient fingertip numbness.  She received radiationto the right iliac wing, from 10/11/2016 - 11/01/2016. She received 3000 cGy over 3 weeks.  PET scanon 01/29/2017 revealed interval progression of hypermetabolic nodules in the right upper (13 mm compared to 9 mm; SUV 10) and left lower lobes (26 mm compared to 22 mm; SUV 7.5). There was new hypermetabolic focus of activity along the anterior left pleura although no underlying pleural or lung mass was evident.  She has squamous cell lung cancer. Foundation Oneon the lung biopsy on 03/15/2018 revealed MS-stable, tumor mutational burden 5 Muts/Mb, AKT2 amplification, CCNE1 amplification, CREBBP [id number], KDR amplification, KIT amplification, PDGFRA amplification, PTEN loss exons 2-4, and OX73 splice site [id number]>M. Marland Kitchen There were no alterations in EGFR, KRAS, RET, ALK, MET, ERBB2, BRAF, ROS1. PDL-1IHC analysis revealed a TPS score of 50%.  CT guided left lower lobe nodule biopsyon 02/19/2017 confirmed squamous cell carcinoma. She underwent SBRT08/27/2018 - 04/09/2017. She received 5000 cGy in 5 fractions to the left lower lobe lesion. She underwent SBRTto a RUL lesion from 05/23/2017 - 06/18/2017.  CT guided left upper lobe nodule biopsyon 02/20/2018 revealedsquamous cell lung cancer. She received SBRTfrom 04/09/2018 - 04/15/2018. She received 6000 cGy in 5 fractions.   Bronchoscopyon 05/30/2018 revealed no endobronchial lesions. The 2 cm pre-carinal lymph node was biopsied. Pathology revealed metastatic non-small cell carcinoma, favor squamous cell carcinoma.  CT guided right paraspinal lesion biopsyon 10/16/2017 was compatible with involvement by the patient's previously diagnosed lymphoma. The case was [facility] to [facility] hematopathology. The neoplastic cells were largely CD30 negative. Repeat CD30  immunohistochemistry faintly stained >10% of the large atypical cells. CD20 stained the scattered large atypical cells and few small cells.   She received 3 cycles of brentuximab vedotin(11/23/2017 - 01/11/2018).  Her lymphoma progressed.  She is s/p14cycles of pembrolizumab(06/14/2018 - 05/21/2019). She received a steroid Dosepak on 01/09/2019 for minor chest CT changes felt secondary to pembrolizumab.  Chest, abdomen, and pelvis CT on 06/06/2019 revealed an interval mass-like opacity in the superior aspect of the left parahilar lung with an interval more prominent mass-like component involving the pleura laterally at the level with slightly more prominent extension into the left hila region, suspicious for recurrent or progressive lung cancer.  There was mediastinal and bilateral hilar adenopathy c/w metastatic adenopathy or involvement with the patient's B-cell lymphoma. There was no evidence of metastatic disease or lymphoma involving the abdomen or pelvis. There was significantly improved ground-glass opacities in the right lung, minimal left pleural fluid (improved), and a stable large hiatal hernia. There was stable enlarged thyroid gland containing heterogeneous nodules with substernal extension on the left.  There was stable to slightly decreased size of the previously demonstrated right adrenal nodule and no significant change in a small, poorly visualized left adrenal nodule.  She hasiron deficiency anemia. Ferritinhas been followed: 14 on 05/06/2018,23 on 07/26/2018, and 59 on 11/01/2018. She denies any bleeding.  She has a grade I neuropathy. B12 and folate were normal on 12/06/2017. She has severe bilateral knee arthritis.  She has received steroid injections and Monovisc (hyaluronate derivatives) without improvement.   She is deafand requires an interpreter.   Symptomatically, she feels "fine".  She denies any respiratory symptoms.  She denies any B symptoms.  Exam  reveals no adenopathy or hepatosplenomegaly.  Plan: 1.   Labs today: CBC with diff, CMP, LDH, uric acid, TSH.  2. [place] zone lymphoma Patient is s/p  6 cycles of mini-RCOP Patient is s/p 3 cycles of brentuximab vedotin. Patient is s/p 14 cycles of pembrolizumab. Clinically, she denies any B symptoms. Exam reveals no palpable adenopathy or hepatosplenomegaly.  Scans on 12/26/2018 and 02/26/2019 reveal no progressive disease. Chest, abdomen and pelvis CTon 06/06/2019 revealed mediastinal and bilateral hilar adenopathy.  Images personally reviewed.  Agree with radiology interpretation. Suspect etiology is due to progressive lung cancer. Patient had documented progressive lymphoma involving the right paraspinal region on 09/2017. Discuss PET scan to fully assess extent of disease and site for biopsy. Discuss plan to hold pembrolizumab today. Discuss plan to continue pembrolizumab if no evidence of progressive lymphoma. 3. Squamous cell lung cancer Patient is s/p SBRT to left upper lobe and left lower lobe nodules.  Patient is s/p 14 cycle of pembrolizumab.  Clinically, she denies any respiratory symptoms.    Chest, abdomen, and pelvis CT on 06/06/2019 revealed mass-like opacity in the superior aspect of the left parahilar lung plus mediastinal and bilateral hilar adenopathy.   Images personally reviewed.  Agree with radiology interpretation.  Suspect etiology is secondary to progressive lung cancer.  Discuss PET scan to full assess extent of disease (lung cancer versus lymphoma).  Discuss referral to Dr Patsey Berthold for biopsy- called after images reviewed.   Patient has an appointment on 06/19/2019 after PET scan.  Discuss plan to hold pembrolizumab today.  Discuss plan for carboplatin and Abraxane if progressive squamous cell lung cancer. 4.Chemotherapy-induced neuropathy She has a stbel grade I neuropathy in her heels. Continue to monitor.  5. B12 deficiency Patient remains  on oral B12. B12 and folate were normal on 12/17/2018. 6.Bilateral knee pain             She is scheduled to see Dr. Harrel Carina, rheumatologist, in 08/2019. She has baseline arthritis which may be exacerbated by pembrolizumab. Incidenceof arthritis 2%, myalgias 12% and arthralgias 10-18%. 7.   Mixed hyperlipidemia  Lipid panel today. 8.   Rx: ondansetron and EMLA cream. 9.   Schedule PET scan. 10.   Tumor board on 06/19/2019. 11.   RTC 3 days after biopsy (patient to call) for MD assessment and discussion regarding direction of therapy.  Addendum:  PET scan on 06/17/2019 revealed known left parahilar lung mass markedly hypermetabolic with associated hypermetabolic lymphadenopathy in both hilar regions, the mediastinum, the right thoracic inlet.  There was hypermetabolism in the right submandibular gland, indeterminate.  There was no hypermetabolic metastatic disease evident in the abdomen or pelvis.  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  There was a tiny left pleural effusion.  Images personally reviewed.  Patient spoke with Dr Patsey Berthold.  She is not interested in biopsy.  Tumor board on 06/19/2019 noted likely progressive lung cancer given chest findings.  There was discussion regarding consideration of palliative radiation on the left given potential obstruction.  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  I discussed the assessment and treatment plan with the patient.  The patient was provided an opportunity to ask questions and all were answered.  The patient agreed with the plan and demonstrated an understanding of the instructions.  The patient was advised to call back if the symptoms worsen or if the condition fails to improve as anticipated.  I provided 24 minutes of face-to-face time  during this this encounter and > 50% was spent counseling as documented under my assessment and  plan.    Lequita Asal, MD, PhD    06/11/2019, 9:32 AM  I, Selena Batten, am acting as scribe for [employer]. Mike Gip, MD, PhD.  I, Maninder Deboer C. Mike Gip, MD, have reviewed the above documentation for accuracy and completeness, and I agree with the above.

## 2019-06-11 ENCOUNTER — Other Ambulatory Visit: Payer: Self-pay

## 2019-06-11 ENCOUNTER — Ambulatory Visit: Payer: Medicare HMO | Admitting: Hematology and Oncology

## 2019-06-11 ENCOUNTER — Inpatient Hospital Stay: Payer: Medicare HMO | Attending: Hematology and Oncology

## 2019-06-11 ENCOUNTER — Inpatient Hospital Stay (HOSPITAL_BASED_OUTPATIENT_CLINIC_OR_DEPARTMENT_OTHER): Payer: Medicare HMO | Admitting: Hematology and Oncology

## 2019-06-11 ENCOUNTER — Other Ambulatory Visit: Payer: Medicare HMO

## 2019-06-11 ENCOUNTER — Inpatient Hospital Stay: Payer: Medicare HMO

## 2019-06-11 ENCOUNTER — Ambulatory Visit: Payer: Medicare HMO

## 2019-06-11 ENCOUNTER — Encounter: Payer: Self-pay | Admitting: Hematology and Oncology

## 2019-06-11 VITALS — BP 132/86 | HR 98 | Temp 97.8°F | Resp 18 | Wt 173.3 lb

## 2019-06-11 DIAGNOSIS — C8330 Diffuse large B-cell lymphoma, unspecified site: Secondary | ICD-10-CM | POA: Diagnosis not present

## 2019-06-11 DIAGNOSIS — M25561 Pain in right knee: Secondary | ICD-10-CM | POA: Diagnosis not present

## 2019-06-11 DIAGNOSIS — Z79899 Other long term (current) drug therapy: Secondary | ICD-10-CM | POA: Diagnosis not present

## 2019-06-11 DIAGNOSIS — Z87891 Personal history of nicotine dependence: Secondary | ICD-10-CM | POA: Diagnosis not present

## 2019-06-11 DIAGNOSIS — E538 Deficiency of other specified B group vitamins: Secondary | ICD-10-CM | POA: Diagnosis not present

## 2019-06-11 DIAGNOSIS — Z801 Family history of malignant neoplasm of trachea, bronchus and lung: Secondary | ICD-10-CM | POA: Insufficient documentation

## 2019-06-11 DIAGNOSIS — C8518 Unspecified B-cell lymphoma, lymph nodes of multiple sites: Secondary | ICD-10-CM | POA: Diagnosis not present

## 2019-06-11 DIAGNOSIS — Z806 Family history of leukemia: Secondary | ICD-10-CM | POA: Insufficient documentation

## 2019-06-11 DIAGNOSIS — T451X5A Adverse effect of antineoplastic and immunosuppressive drugs, initial encounter: Secondary | ICD-10-CM | POA: Insufficient documentation

## 2019-06-11 DIAGNOSIS — Z833 Family history of diabetes mellitus: Secondary | ICD-10-CM | POA: Diagnosis not present

## 2019-06-11 DIAGNOSIS — M25562 Pain in left knee: Secondary | ICD-10-CM

## 2019-06-11 DIAGNOSIS — C3490 Malignant neoplasm of unspecified part of unspecified bronchus or lung: Secondary | ICD-10-CM

## 2019-06-11 DIAGNOSIS — C3412 Malignant neoplasm of upper lobe, left bronchus or lung: Secondary | ICD-10-CM | POA: Insufficient documentation

## 2019-06-11 DIAGNOSIS — R590 Localized enlarged lymph nodes: Secondary | ICD-10-CM | POA: Diagnosis not present

## 2019-06-11 DIAGNOSIS — E782 Mixed hyperlipidemia: Secondary | ICD-10-CM

## 2019-06-11 DIAGNOSIS — R200 Anesthesia of skin: Secondary | ICD-10-CM | POA: Diagnosis not present

## 2019-06-11 DIAGNOSIS — K449 Diaphragmatic hernia without obstruction or gangrene: Secondary | ICD-10-CM | POA: Diagnosis not present

## 2019-06-11 DIAGNOSIS — D3502 Benign neoplasm of left adrenal gland: Secondary | ICD-10-CM | POA: Diagnosis not present

## 2019-06-11 DIAGNOSIS — G620 Drug-induced polyneuropathy: Secondary | ICD-10-CM | POA: Insufficient documentation

## 2019-06-11 DIAGNOSIS — D509 Iron deficiency anemia, unspecified: Secondary | ICD-10-CM | POA: Insufficient documentation

## 2019-06-11 DIAGNOSIS — D3501 Benign neoplasm of right adrenal gland: Secondary | ICD-10-CM | POA: Diagnosis not present

## 2019-06-11 DIAGNOSIS — M170 Bilateral primary osteoarthritis of knee: Secondary | ICD-10-CM | POA: Diagnosis not present

## 2019-06-11 DIAGNOSIS — G8929 Other chronic pain: Secondary | ICD-10-CM

## 2019-06-11 DIAGNOSIS — H9190 Unspecified hearing loss, unspecified ear: Secondary | ICD-10-CM | POA: Insufficient documentation

## 2019-06-11 DIAGNOSIS — Z7189 Other specified counseling: Secondary | ICD-10-CM

## 2019-06-11 LAB — CBC WITH DIFFERENTIAL/PLATELET
Abs Immature Granulocytes: 0.06 10*3/uL (ref 0.00–0.07)
Basophils Absolute: 0.1 10*3/uL (ref 0.0–0.1)
Basophils Relative: 1 %
Eosinophils Absolute: 0.3 10*3/uL (ref 0.0–0.5)
Eosinophils Relative: 3 %
HCT: 38.8 % (ref 36.0–46.0)
Hemoglobin: 13.2 g/dL (ref 12.0–15.0)
Immature Granulocytes: 1 %
Lymphocytes Relative: 18 %
Lymphs Abs: 1.5 10*3/uL (ref 0.7–4.0)
MCH: 32.8 pg (ref 26.0–34.0)
MCHC: 34 g/dL (ref 30.0–36.0)
MCV: 96.3 fL (ref 80.0–100.0)
Monocytes Absolute: 1.1 10*3/uL — ABNORMAL HIGH (ref 0.1–1.0)
Monocytes Relative: 12 %
Neutro Abs: 5.7 10*3/uL (ref 1.7–7.7)
Neutrophils Relative %: 65 %
Platelets: 319 10*3/uL (ref 150–400)
RBC: 4.03 MIL/uL (ref 3.87–5.11)
RDW: 14.6 % (ref 11.5–15.5)
WBC: 8.7 10*3/uL (ref 4.0–10.5)
nRBC: 0 % (ref 0.0–0.2)

## 2019-06-11 LAB — COMPREHENSIVE METABOLIC PANEL
ALT: 13 U/L (ref 0–44)
AST: 22 U/L (ref 15–41)
Albumin: 3.3 g/dL — ABNORMAL LOW (ref 3.5–5.0)
Alkaline Phosphatase: 72 U/L (ref 38–126)
Anion gap: 9 (ref 5–15)
BUN: 16 mg/dL (ref 8–23)
CO2: 23 mmol/L (ref 22–32)
Calcium: 8.9 mg/dL (ref 8.9–10.3)
Chloride: 103 mmol/L (ref 98–111)
Creatinine, Ser: 0.89 mg/dL (ref 0.44–1.00)
GFR calc Af Amer: >60 mL/min (ref >60)
GFR calc non Af Amer: >60 mL/min (ref >60)
Glucose, Bld: 116 mg/dL — ABNORMAL HIGH (ref 70–99)
Potassium: 3.7 mmol/L (ref 3.5–5.1)
Sodium: 135 mmol/L (ref 135–145)
Total Bilirubin: 1.2 mg/dL (ref 0.3–1.2)
Total Protein: 7.2 g/dL (ref 6.5–8.1)

## 2019-06-11 LAB — LACTATE DEHYDROGENASE: LDH: 140 U/L (ref 98–192)

## 2019-06-11 LAB — LIPID PANEL
Cholesterol: 114 mg/dL (ref 0–200)
HDL: 36 mg/dL — ABNORMAL LOW (ref >40)
LDL Cholesterol: 60 mg/dL (ref 0–99)
Total CHOL/HDL Ratio: 3.2 RATIO
Triglycerides: 92 mg/dL (ref <150)
VLDL: 18 mg/dL (ref 0–40)

## 2019-06-11 LAB — TSH: TSH: 1.712 u[IU]/mL (ref 0.350–4.500)

## 2019-06-11 LAB — URIC ACID: Uric Acid, Serum: 3.5 mg/dL (ref 2.5–7.1)

## 2019-06-11 MED ORDER — HEPARIN SOD (PORK) LOCK FLUSH 100 UNIT/ML IV SOLN
500.0000 [IU] | Freq: Once | INTRAVENOUS | Status: AC
  Administered 2019-06-11: 500 [IU] via INTRAVENOUS

## 2019-06-11 MED ORDER — LIDOCAINE-PRILOCAINE 2.5-2.5 % EX CREA: TOPICAL_CREAM | CUTANEOUS | 3 refills | Status: AC

## 2019-06-11 MED ORDER — SODIUM CHLORIDE 0.9% FLUSH
10.0000 mL | INTRAVENOUS | Status: DC | PRN
  Administered 2019-06-11: 10 mL via INTRAVENOUS
  Filled 2019-06-11: qty 10

## 2019-06-11 MED ORDER — ONDANSETRON 4 MG PO TBDP: 4.0000 mg | ORAL_TABLET | Freq: Three times a day (TID) | ORAL | 1 refills | Status: AC | PRN

## 2019-06-11 NOTE — Progress Notes (Signed)
Patient here for follow up. Denies any concerns today. Requesting refill for Zofran and EMLA cream.

## 2019-06-12 ENCOUNTER — Telehealth: Payer: Self-pay

## 2019-06-12 NOTE — Telephone Encounter (Signed)
Labs has been routed to the patient PCP.

## 2019-06-17 ENCOUNTER — Ambulatory Visit
Admission: RE | Admit: 2019-06-17 | Discharge: 2019-06-17 | Disposition: A | Discharge status: Home / Self Care | Payer: Medicare HMO | Source: Ambulatory Visit | Attending: Hematology and Oncology | Admitting: Hematology and Oncology

## 2019-06-17 ENCOUNTER — Other Ambulatory Visit: Payer: Self-pay

## 2019-06-17 DIAGNOSIS — D3502 Benign neoplasm of left adrenal gland: Secondary | ICD-10-CM | POA: Diagnosis not present

## 2019-06-17 DIAGNOSIS — D3501 Benign neoplasm of right adrenal gland: Secondary | ICD-10-CM | POA: Insufficient documentation

## 2019-06-17 DIAGNOSIS — J90 Pleural effusion, not elsewhere classified: Secondary | ICD-10-CM | POA: Diagnosis not present

## 2019-06-17 DIAGNOSIS — C3490 Malignant neoplasm of unspecified part of unspecified bronchus or lung: Secondary | ICD-10-CM | POA: Diagnosis not present

## 2019-06-17 DIAGNOSIS — K449 Diaphragmatic hernia without obstruction or gangrene: Secondary | ICD-10-CM | POA: Insufficient documentation

## 2019-06-17 DIAGNOSIS — C8518 Unspecified B-cell lymphoma, lymph nodes of multiple sites: Secondary | ICD-10-CM | POA: Diagnosis not present

## 2019-06-17 DIAGNOSIS — R590 Localized enlarged lymph nodes: Secondary | ICD-10-CM | POA: Diagnosis not present

## 2019-06-17 LAB — GLUCOSE, CAPILLARY: Glucose-Capillary: 99 mg/dL (ref 70–99)

## 2019-06-17 MED ORDER — FLUDEOXYGLUCOSE F - 18 (FDG) INJECTION
9.0000 | Freq: Once | INTRAVENOUS | Status: AC | PRN
  Administered 2019-06-17: 9.43 via INTRAVENOUS

## 2019-06-19 ENCOUNTER — Encounter: Payer: Self-pay | Admitting: Pulmonary Disease

## 2019-06-19 ENCOUNTER — Ambulatory Visit: Payer: Medicare HMO | Admitting: Pulmonary Disease

## 2019-06-19 ENCOUNTER — Other Ambulatory Visit: Payer: Self-pay

## 2019-06-19 ENCOUNTER — Other Ambulatory Visit: Payer: Medicare HMO

## 2019-06-19 VITALS — BP 132/72 | HR 99 | Temp 97.4°F | Ht 63.0 in | Wt 173.0 lb

## 2019-06-19 DIAGNOSIS — R9389 Abnormal findings on diagnostic imaging of other specified body structures: Secondary | ICD-10-CM

## 2019-06-19 NOTE — Progress Notes (Signed)
Tumor Board Documentation  Calani Gick was presented by Dr Mike Gip at our Tumor Board on 06/19/2019, which included representatives from medical oncology, radiation oncology, pathology, radiology, surgical, navigation, internal medicine, genetics, pulmonology, palliative care, research.  Toni Griffith currently presents as a current patient, for discussion with history of the following treatments: neoadjuvant chemoradiation.  Additionally, we reviewed previous medical and familial history, history of present illness, and recent lab results along with all available histopathologic and imaging studies. The tumor board considered available treatment options and made the following recommendations: Immunotherapy Pt not interested in biopsy, will consider radiation therapy  The following procedures/referrals were also placed: No orders of the defined types were placed in this encounter.   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 Final determinations in the plan of care are those of the provider(s). The responsibility for follow up of recommendations given during tumor board is that of the provider.   Today's extended care, comprehensive team conference, Paulette was not present for the discussion and was not examined.   Multidisciplinary Tumor Board is a multidisciplinary case peer review process.  Decisions discussed in the Multidisciplinary Tumor Board reflect the opinions of the specialists present at the conference without having examined the patient.  Ultimately, treatment and diagnostic decisions rest with the primary provider(s) and the patient.

## 2019-06-19 NOTE — Patient Instructions (Addendum)
You can return here as needed.

## 2019-07-01 NOTE — Progress Notes (Signed)
[facility]  [address] [place], [place] [address] Phone: [phone]  Fax: [phone]   Clinic Day:  07/02/2019  Referring physician: Francesca Oman, DO  Chief Complaint: Toni Griffith is a 78 y.o. 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  HPI: The patient was last seen in the medical oncology clinic on 06/11/2019. At that time, she felt "fine". She denied any respiratory symptoms. She denied any B symptoms. Exam revealed no adenopathy or hepatosplenomegaly. CBC with diff, LDH, and uric acid were normal. We discussed a PET scan.  PET scan on 06/17/2019 revealed a known left parahilar lung mass markedly hypermetabolic with associated hypermetabolic lymphadenopathy in both hilar regions, the mediastinum, and the right thoracic inlet. There was hypermetabolism in the right submandibular gland, indeterminate. There was no hypermetabolic metastatic disease evident in the abdomen or pelvis. There was a stable bilateral adrenal adenomas since 01/29/2018. There were tiny left pleural effusion and a large hiatal hernia. There was a segment of transverse colon extending up into the hiatal hernia without obstructive features.   Tumor board recommended immunotherapy on 06/19/2019.  As she was not interested in a biopsy, palliative radiation therapy was discussed to prevent impending obstruction.   During the interim, she has felt "upset". Patient notes ongoing knee pain (rheumatology appointment in 07/2019). She is coughing up mucus. I advised her daughter to call the clinic if she has a fever. The family had concerns and questions regarding her treatment. The daughter asked if there were any other treatments for her mother. The daughter wanted to know what would happen if her mother no longer wanted treatment. I discussed  all the treatment options and side affects short/long term. Patient became tearful when discussing her cancer. Patient agreed to evaluation for radiation and initiation of chemotherapy and immunotherapy post radiation.  She would like to receive pembrolizumab on 07/09/2019.    Past Medical History:  Diagnosis Date   Arthritis    Cancer ([place])    lymphoma-right hip   Deaf    GERD (gastroesophageal reflux disease)    Hypertension    NOT ON MED AT PRESENT   Squamous cell lung cancer ([place])     Past Surgical History:  Procedure Laterality Date   CARDIAC SURGERY  1993   CORONARY ARTERY BYPASS GRAFT     DOUBLE   CYST REMOVAL HAND Right    ENDOBRONCHIAL ULTRASOUND N/A 05/30/2018   Procedure: ENDOBRONCHIAL ULTRASOUND;  Surgeon: Tyler Pita, MD;  Location: [facility] ORS;  Service: Cardiopulmonary;  Laterality: N/A;   PERIPHERAL VASCULAR CATHETERIZATION N/A 04/06/2016   Procedure: Glori Luis Cath Insertion;  Surgeon: Algernon Huxley, MD;  Location: [facility];  Service: Cardiovascular;  Laterality: N/A;    Family History  Problem Relation Age of Onset   Lung cancer Mother    Lung cancer Father    Diabetes Maternal Uncle    Myasthenia gravis Maternal Grandmother    Leukemia Maternal Grandfather     Social History:  reports that she quit smoking about 3 years ago. Her smoking use included cigarettes. She has a 80.00 pack-year smoking history. She has never used smokeless tobacco. She reports current alcohol use. She reports that she does not use drugs. . She smoked 1 1/2 packs per day for 20 years (30 pack year smoking history) until 10/2015. She is  hearing impaired (deaf). She lives in [place] with her boyfriend. Her daughter lives in [place]. Her daughter, [name]'s contact number is [phone]. She is living with her son. The patient is accompanied by her son, daughter, daughters boyfriend via Ipad from the conference room.  The interpreter, Calrine, is in the patient's  room  today.  Allergies: No Known Allergies  Current Medications: Current Outpatient Medications  Medication Sig Dispense Refill   acetaminophen (TYLENOL) 500 MG tablet Take 500 mg by mouth every 6 (six) hours as needed for moderate pain.      allopurinol (ZYLOPRIM) 300 MG tablet TAKE 1 TABLET BY MOUTH EVERY DAY 90 tablet 0   aspirin EC 81 MG tablet Take 81 mg by mouth daily.     atorvastatin (LIPITOR) 80 MG tablet Take 80 mg by mouth every evening.      b complex vitamins tablet Take 1 tablet by mouth daily.     Cholecalciferol (VITAMIN D3) 2000 units capsule Take 2,000 Units by mouth daily.     DULoxetine (CYMBALTA) 30 MG capsule Take 1 capsule (70m) daily for 7 days then increase to 2 capsules (629m daily.     ferrous sulfate 325 (65 FE) MG tablet Take 650 mg by mouth daily with breakfast.     gabapentin (NEURONTIN) 100 MG capsule Take 2 capsules (200 mg total) by mouth at bedtime. 60 capsule 3   pantoprazole (PROTONIX) 40 MG tablet Take 40 mg by mouth daily.      vitamin B-12 (CYANOCOBALAMIN) 500 MCG tablet Take 500 mcg by mouth daily.     vitamin E 400 UNIT capsule Take 400 Units by mouth daily.      lidocaine-prilocaine (EMLA) cream Apply cream 1 hour before chemotherapy treatment and place small peive of saran wrap over cream to protect clothing (Patient not taking: Reported on 07/02/2019) 30 g 3   ondansetron (ZOFRAN ODT) 4 MG disintegrating tablet Take 1 tablet (4 mg total) by mouth every 8 (eight) hours as needed for nausea or vomiting. (Patient not taking: Reported on 07/02/2019) 30 tablet 1   No current facility-administered medications for this visit.    Facility-Administered Medications Ordered in Other Visits  Medication Dose Route Frequency Provider Last Rate Last Dose   heparin lock flush 100 unit/mL  500 Units Intravenous Once Lukisha Procida C, MD       sodium chloride flush (NS) 0.9 % injection 10 mL  10 mL Intravenous PRN CoMike GipMelissa C, MD   10  mL at 04/05/17 1034   sodium chloride flush (NS) 0.9 % injection 10 mL  10 mL Intravenous PRN CoLequita AsalMD   10 mL at 02/27/19 1033    Review of Systems  Constitutional: Negative for chills, diaphoresis, fever, malaise/fatigue and weight loss (stable).       Feels "ok".   HENT: Positive for hearing loss (deaf). Negative for congestion, ear pain, nosebleeds, sinus pain and sore throat.   Eyes: Negative.  Negative for blurred vision, double vision, photophobia and pain.  Respiratory: Positive for cough, sputum production (mucus) and shortness of breath (on exertion). Negative for hemoptysis and wheezing.   Cardiovascular: Negative.  Negative for chest pain, palpitations, orthopnea and leg swelling.  Gastrointestinal: Negative.  Negative for abdominal pain, blood in stool, constipation, diarrhea, heartburn, melena, nausea and vomiting.  Genitourinary: Negative.  Negative for dysuria, flank pain, frequency, hematuria and urgency.  Musculoskeletal: Positive for joint pain (knees- chronic). Negative for back pain, myalgias and neck pain.  Skin: Negative.  Negative for itching and rash.  Neurological: Positive for sensory change (heel feels numb- stable). Negative for dizziness, tremors, speech change, focal weakness, seizures, weakness and headaches.  Endo/Heme/Allergies: Negative.  Does not bruise/bleed easily.  Psychiatric/Behavioral: Negative for depression, hallucinations and memory loss. The patient is nervous/anxious (regarding health). The patient does not have insomnia.        Tearful secondary to cancer progression.  All other systems reviewed and are negative.  Performance status (ECOG):  1  Vitals Blood pressure (!) 143/81, pulse 96, temperature (!) 97.1 F (36.2 C), temperature source Tympanic, resp. rate 18, height '5\' 3"'  (1.6 m), weight 173 lb 3.2 oz (78.6 kg), SpO2 98 %.   Physical Exam  Constitutional: She is oriented to person, place, and time. Vital signs are  normal. She appears well-developed and well-nourished. No distress.  Patient is sitting comfortably in a wheelchair.  HENT:  Head: Normocephalic and atraumatic.  Curly short gray hair.  Mask.  Eyes: Conjunctivae and EOM are normal. No scleral icterus.  Hazel eyes.  Neurological: She is alert and oriented to person, place, and time.  Skin: She is not diaphoretic. No pallor.  Psychiatric: She has a normal mood and affect. Her behavior is normal. Judgment and thought content normal.  Tearful at times.  Nursing note and vitals reviewed.   Imaging studies: 03/03/2016:  PET scanrevealed a hypermetabolic left lower lobe mass, bilateral pleural and pulmonary parenchymal nodular metastasis, a large pleural lesion invading the anterior left chest wall. There was activity in the superior left ocular orbit. There was a large lesion centered in the right iliac wing with soft tissue extension. There were hypermetabolic right external iliac lymph nodes 01/29/2017:  PET scanrevealed interval progression of hypermetabolic nodules in the right upper (13 mm compared to 9 mm; SUV 10) and left lower lobes (26 mm compared to 22 mm; SUV 7.5). There was new hypermetabolic focus of activity along the anterior left pleura although no underlying pleural or lung mass was evident. 09/18/2017:  PET scanrevealed a mixed appearance, with marked improvement in the prior 2 pulmonary nodules; but with new Deauville 5 left common iliac lymph node enlargement; new Deauville 5 right paraspinal lesion; and new Deauville 4 left infrahilar nodal activity. There was a focus of accentuated metabolic activity which had been present along the right lower submandibular gland but currently seemed to project over the subcutaneous tissues without CT correlate. Current maximum SUV 10.5, previously 9.7. There was increased accentuated activity along the left lateral spurring at L2-3. While typically such activity is inflammatory due to  spurring, this had increased significantly since the prior exam and merit surveillance. There was a new 8 by 5 mm right upper lobe pulmonary nodule, Deauville 2. 01/29/2018:  PET scan revealed persistent and progressive areas of hypermetabolism within the right submandibular gland, lingula of the left lung, left common iliac nodal chain, and right lumbar paraspinal soft tissues. Findings were compatible with Deauville criteria 4 and 5. There was a new nodule is identified within the lingula which demonstrated intense uptake compatible with Deauville criteria 5.  05/14/2018:  Chest, abdomen, and pelvisCTrevealed enlarging mediastinal adenopathy and right upper lobe nodule, indicative of disease progression. Necrotic lingular nodules were grossly stable in size with developing surrounding collapse/consolidation, possibly treatment related. There was slight decrease in size of a left external iliac lymph node and right lumbar paraspinal soft tissue lesion. There was a tiny left pleural effusion. 06/24/2018:  Head MRIrevealed no evidence of intracranial  metastasis. 12/26/2018:  Chest,abdomen, andpelvisCTrevealed interval increase in dense consolidation of perihilar left lung and lingula and a dense bandlike perifissural consolidation of the inferior right upper lobe, in keeping with evolving post treatment change. Therewas an unchanged small left pleural effusion. Therewas new, diffusely scattered ground-glass opacity bilaterally, with and an unusual appearance and central clearing, most conspicuous in the right upper lobe.These findings were nonspecific and infectious or inflammatory, and particularly drug toxicity is a differential consideration.There was no change in enlarged mediastinal and AP window lymph nodes.There was no evidence of distant metastatic disease or lymphadenopathy in the abdomen or pelvis.COVID-19 testingwas negative on 01/09/2019. 02/26/2019:  Chest CTrevealed near  resolution of diffuse bilateral ground-glass opacity with central predominance in the lungs previously has nearly resolved in the interval. The collapse/consolidative change in the inferior right upper lobe and parahilar left lung was stable. There were atable borderline enlarged prevascular lymph nodes. 06/06/2019:  Chest, abdomen, and pelvis CT revealed an interval mass-like opacity in the superior aspect of the left parahilar lung with an interval more prominent mass-like component involving the pleura laterally at the level with slightly more prominent extension into the left hila region, suspicious for recurrent or progressive lung cancer.  There was mediastinal and bilateral hilar adenopathy c/w metastatic adenopathy or involvement with the patient's B-cell lymphoma. There was no evidence of metastatic disease or lymphoma involving the abdomen or pelvis. There was significantly improved ground-glass opacities in the right lung, minimal left pleural fluid (improved), and a stable large hiatal hernia. There was stable enlarged thyroid gland containing heterogeneous nodules with substernal extension on the left.  There was stable to slightly decreased size of the previously demonstrated right adrenal nodule and no significant change in a small, poorly visualized left adrenal nodule. 06/17/2019:  PET scan revealed a known left parahilar lung mass markedly hypermetabolic with associated hypermetabolic lymphadenopathy in both hilar regions, the mediastinum, and the right thoracic inlet. There was hypermetabolism in the right submandibular gland, indeterminate. There was no hypermetabolic metastatic disease evident in the abdomen or pelvis. There was a stable bilateral adrenal adenomas since 01/29/2018. There were tiny left pleural effusion and a large hiatal hernia.  There was a segment of transverse colon extending up into the hiatal hernia without obstructive features.    No visits with results within 3  Day(s) from this visit.  Latest known visit with results is:  Hospital Outpatient Visit on 06/17/2019  Component Date Value Ref Range Status   Glucose-Capillary 06/17/2019 99  70 - 99 mg/dL Final    Assessment:  Tkai Large is a 78 y.o. female with stage IVB B-cell lymphoma, unclassifiable, with features intermediate between diffuse large B-cell lymphoma and classical Hodgkin's lymphoma ("gray zone" lymphoma). She underwent right iliac wing bone/bone marrow biopsy on 03/08/2016.  PET scanon 03/03/2016 revealed a hypermetabolic left lower lobe mass, bilateral pleural and pulmonary parenchymal nodular metastasis, a large pleural lesion invading the anterior left chest wall. There was activity in the superior left ocular orbit. There was a large lesion centered in the right iliac wing with soft tissue extension. There were hypermetabolic right external iliac lymph nodes  [place] 04/04/2016 revealed an EF of 55-60%. Hepatitis B and C testing was negative on 03/30/2016. G6PD assay was normal. She declined LP with MTX prophylaxis.  She received 6 cycles of mini-RCHOP(04/07/2016 - 07/21/2016). She has had some transient fingertip numbness.  She received radiationto the right iliac wing, from 10/11/2016 - 11/01/2016. She received 3000 cGy over 3 weeks.  PET scanon 01/29/2017 revealed interval progression of hypermetabolic nodules in the right upper (13 mm compared to 9 mm; SUV 10) and left lower lobes (26 mm compared to 22 mm; SUV 7.5). There was new hypermetabolic focus of activity along the anterior left pleura although no underlying pleural or lung mass was evident.  She has squamous cell lung cancer. Foundation Oneon the lung biopsy on 03/15/2018 revealed MS-stable, tumor mutational burden 5 Muts/Mb, AKT2 amplification, CCNE1 amplification, CREBBP R1446L, KDR amplification, KIT amplification, PDGFRA amplification, PTEN loss exons 2-4, and HC62 splice site 376+2G>B. Marland Kitchen  There were no alterations in EGFR, KRAS, RET, ALK, MET, ERBB2, BRAF, ROS1. PDL-1IHC analysis revealed a TPS score of 50%.  CT guided left lower lobe nodule biopsyon 02/19/2017 confirmed squamous cell carcinoma. She underwent SBRT08/27/2018 - 04/09/2017. She received 5000 cGy in 5 fractions to the left lower lobe lesion. She underwent SBRTto a RUL lesion from 05/23/2017 - 06/18/2017.  CT guided left upper lobe nodule biopsyon 02/20/2018 revealedsquamous cell lung cancer. She received SBRTfrom 04/09/2018 - 04/15/2018. She received 6000 cGy in 5 fractions.   Bronchoscopyon 05/30/2018 revealed no endobronchial lesions. The 2 cm pre-carinal lymph node was biopsied. Pathology revealed metastatic non-small cell carcinoma, favor squamous cell carcinoma.  CT guided right paraspinal lesion biopsyon 10/16/2017 was compatible with involvement by the patient's previously diagnosed lymphoma. The case was referred to [facility] hematopathology. The neoplastic cells were largely CD30 negative. Repeat CD30 immunohistochemistry faintly stained >10% of the large atypical cells. CD20 stained the scattered large atypical cells and few small cells.   She received 3 cycles of brentuximab vedotin(11/23/2017 - 01/11/2018).  Her lymphoma progressed.  She is s/p14cycles of pembrolizumab(06/14/2018 - 05/21/2019). She received a steroid Dosepak on 01/09/2019 for minor chest CT changes felt secondary to pembrolizumab.  Chest, abdomen, and pelvis CT on 06/06/2019 revealed an interval mass-like opacity in the superior aspect of the left parahilar lung with an interval more prominent mass-like component involving the pleura laterally at the level with slightly more prominent extension into the left hila region, suspicious for recurrent or progressive lung cancer.  There was mediastinal and bilateral hilar adenopathy c/w metastatic adenopathy or involvement with the patient's B-cell lymphoma. There was no  evidence of metastatic disease or lymphoma involving the abdomen or pelvis. There was significantly improved ground-glass opacities in the right lung, minimal left pleural fluid (improved), and a stable large hiatal hernia. There was stable enlarged thyroid gland containing heterogeneous nodules with substernal extension on the left.  There was stable to slightly decreased size of the previously demonstrated right adrenal nodule and no significant change in a small, poorly visualized left adrenal nodule.  PET scan on 06/17/2019 revealed a known left parahilar lung mass markedly hypermetabolic with associated hypermetabolic lymphadenopathy in both hilar regions, the mediastinum, and the right thoracic inlet. There was hypermetabolism in the right submandibular gland, indeterminate. There was no hypermetabolic metastatic disease evident in the abdomen or pelvis. There was a stable bilateral adrenal adenomas since 01/29/2018. There were tiny left pleural effusion and a large hiatal hernia.  There was a segment of transverse colon extending up into the hiatal hernia without obstructive features.   She hasiron deficiency anemia. Ferritinhas been followed: 14 on 05/06/2018,23 on 07/26/2018, and 59 on 11/01/2018. She denies any bleeding.  She has a grade I neuropathy. B12 and folate were normal on 12/06/2017. She has severe bilateral knee arthritis. She has received steroid injections and Monovisc (hyaluronate derivatives) without improvement.   She is deafand requires  an interpreter.   Symptomatically, she has a slight cough with mucus production.  She denies any pain.  She denies any fevers, sweats or weight loss.  She has chronic knee pain.  Plan: 1. [place] Cables zone lymphoma Patient is s/p 6 cycles of mini-RCOP Patient is s/p 3 cycles of brentuximab vedotin. Patient is s/p 14 cycles of pembrolizumab. Clinically,she she denies any B symptoms. Exam on 06/11/2019 revealed no palpable adenopathy  or  hepatosplenomegaly.  Chest, abdomen and pelvis CTon 06/06/2019 revealed mediastinal and bilateral hilar adenopathy. PET scan on 06/17/2019 revealed progressive disease in the chest only.             Suspect etiology is due to progressive lung cancer.  Patient declined biopsy. Patient had documented progressive lymphoma involving the right paraspinal region on 09/2017. Discuss continued maintenance pembrolizumab to prevent emergence of active lymphoma  Pembrolizumab every 3 weeks.  Patient would like to restart pembrolizumab on 07/09/2019. 2. Squamous cell lung cancer Patient is s/p SBRT to left upper lobe and left lower lobe nodules.       Patient is s/p 14 cycle of pembrolizumab.       Clinically, she has a slight cough with mucus production.       Chest, abdomen, and pelvis CT on 06/06/2019 revealed mass-like opacity in the superior aspect of the left parahilar lung plus mediastinal and bilateral hilar adenopathy.  PET scan on 06/17/2019 revealed a left parahilar lung mass (SUV 16.9) with hypermetabolic bilateral hilar adenopathy, the mediastinum, and the right thoracic inlet.         Images reviewed personally with patient and with her family via [place].   Patient declined biopsy.   Etiology felt secondary to progressive lung cancer.        She has a tiny new left effusion (unclear significance- if positive M1a).   Clinical stage T4N3Mx (stage IIIC).  Discuss tumor board discussions.   Discuss concern for likely pending obstruction on the left.   Discuss palliative radiation.    Length of treatment and field to be determined by radiation oncology.    Potential side effects reviewed.       Review plan for carboplatin and Abraxane following completion of radiation.   Chemotherapy schedule reviewed.    Abraxane 100 mg/m2 IV on days 1,8, and 15.    Carboplatin AUC 5-6 on day 1    Cycle length every 21 days.    Pembrolizumab incorporated for maintenance of gray zone lymphoma  on day 1 as per Edward [place] 2018 Nov 22; [phone] 2051.   Potential side effects reviewed.   Information provided.  Multiple questions asked and answered. 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 Continue to monitor. 4. B12 deficiency Patient remains on oral B12. B12 and folate were normal on 12/17/2018. 5.Bilateral knee pain She is scheduled tosee Dr. Harrel Carina, rheumatologist, in 07/2019. She has baseline arthritis which may be exacerbated by pembrolizumab. Incidenceof arthritis 2%, myalgias 12% and arthralgias 10-18%.  Continue to monitor. 6.   Radiation oncology consult- MD to contact Dr Baruch Gouty. 7.   RTC on 07/09/2019 for MD assessment, labs (CBC with diff, CMP, Mg, TSH), and pembrolizumab.  I discussed the assessment and treatment plan with the patient.  The patient was provided an opportunity to ask questions and all were answered.  The patient agreed with the plan and demonstrated an understanding of the instructions.  The patient was advised to call back if the  symptoms worsen or if the condition fails to improve as anticipated.  I provided 35 minutes of face-to-face time during this this encounter and > 50% was spent counseling as documented under my assessment and plan.    Lequita Asal, MD, PhD    07/02/2019, 2:42 PM  I, Selena Batten, am acting as scribe for [employer]. Mike Gip, MD, PhD.  I, Laberta Wilbon C. Mike Gip, MD, have reviewed the above documentation for accuracy and completeness, and I agree with the above.

## 2019-07-02 ENCOUNTER — Inpatient Hospital Stay: Payer: Medicare HMO | Attending: Hematology and Oncology | Admitting: Hematology and Oncology

## 2019-07-02 ENCOUNTER — Encounter: Payer: Self-pay | Admitting: Hematology and Oncology

## 2019-07-02 ENCOUNTER — Other Ambulatory Visit: Payer: Self-pay

## 2019-07-02 VITALS — BP 143/81 | HR 96 | Temp 97.1°F | Resp 18 | Ht 63.0 in | Wt 173.2 lb

## 2019-07-02 DIAGNOSIS — C8518 Unspecified B-cell lymphoma, lymph nodes of multiple sites: Secondary | ICD-10-CM

## 2019-07-02 DIAGNOSIS — C3490 Malignant neoplasm of unspecified part of unspecified bronchus or lung: Secondary | ICD-10-CM

## 2019-07-02 DIAGNOSIS — G620 Drug-induced polyneuropathy: Secondary | ICD-10-CM | POA: Diagnosis not present

## 2019-07-02 DIAGNOSIS — C3432 Malignant neoplasm of lower lobe, left bronchus or lung: Secondary | ICD-10-CM | POA: Insufficient documentation

## 2019-07-02 DIAGNOSIS — M25562 Pain in left knee: Secondary | ICD-10-CM | POA: Insufficient documentation

## 2019-07-02 DIAGNOSIS — E538 Deficiency of other specified B group vitamins: Secondary | ICD-10-CM | POA: Insufficient documentation

## 2019-07-02 DIAGNOSIS — D3502 Benign neoplasm of left adrenal gland: Secondary | ICD-10-CM | POA: Diagnosis not present

## 2019-07-02 DIAGNOSIS — Z7189 Other specified counseling: Secondary | ICD-10-CM | POA: Diagnosis not present

## 2019-07-02 DIAGNOSIS — T451X5A Adverse effect of antineoplastic and immunosuppressive drugs, initial encounter: Secondary | ICD-10-CM | POA: Diagnosis not present

## 2019-07-02 DIAGNOSIS — M170 Bilateral primary osteoarthritis of knee: Secondary | ICD-10-CM | POA: Insufficient documentation

## 2019-07-02 DIAGNOSIS — Z833 Family history of diabetes mellitus: Secondary | ICD-10-CM | POA: Diagnosis not present

## 2019-07-02 DIAGNOSIS — H9190 Unspecified hearing loss, unspecified ear: Secondary | ICD-10-CM | POA: Diagnosis not present

## 2019-07-02 DIAGNOSIS — E049 Nontoxic goiter, unspecified: Secondary | ICD-10-CM | POA: Insufficient documentation

## 2019-07-02 DIAGNOSIS — G8929 Other chronic pain: Secondary | ICD-10-CM | POA: Insufficient documentation

## 2019-07-02 DIAGNOSIS — J90 Pleural effusion, not elsewhere classified: Secondary | ICD-10-CM | POA: Diagnosis not present

## 2019-07-02 DIAGNOSIS — Z79899 Other long term (current) drug therapy: Secondary | ICD-10-CM | POA: Insufficient documentation

## 2019-07-02 DIAGNOSIS — K449 Diaphragmatic hernia without obstruction or gangrene: Secondary | ICD-10-CM | POA: Diagnosis not present

## 2019-07-02 DIAGNOSIS — Z5112 Encounter for antineoplastic immunotherapy: Secondary | ICD-10-CM | POA: Diagnosis present

## 2019-07-02 DIAGNOSIS — R05 Cough: Secondary | ICD-10-CM | POA: Diagnosis not present

## 2019-07-02 DIAGNOSIS — C3412 Malignant neoplasm of upper lobe, left bronchus or lung: Secondary | ICD-10-CM | POA: Insufficient documentation

## 2019-07-02 DIAGNOSIS — M25561 Pain in right knee: Secondary | ICD-10-CM | POA: Insufficient documentation

## 2019-07-02 DIAGNOSIS — C8582 Other specified types of non-Hodgkin lymphoma, intrathoracic lymph nodes: Secondary | ICD-10-CM | POA: Diagnosis not present

## 2019-07-02 DIAGNOSIS — D3501 Benign neoplasm of right adrenal gland: Secondary | ICD-10-CM | POA: Diagnosis not present

## 2019-07-02 DIAGNOSIS — D509 Iron deficiency anemia, unspecified: Secondary | ICD-10-CM | POA: Insufficient documentation

## 2019-07-02 DIAGNOSIS — R208 Other disturbances of skin sensation: Secondary | ICD-10-CM | POA: Diagnosis not present

## 2019-07-02 DIAGNOSIS — Z801 Family history of malignant neoplasm of trachea, bronchus and lung: Secondary | ICD-10-CM | POA: Diagnosis not present

## 2019-07-02 DIAGNOSIS — Z820 Family history of epilepsy and other diseases of the nervous system: Secondary | ICD-10-CM | POA: Insufficient documentation

## 2019-07-02 DIAGNOSIS — Z87891 Personal history of nicotine dependence: Secondary | ICD-10-CM | POA: Insufficient documentation

## 2019-07-02 DIAGNOSIS — Z806 Family history of leukemia: Secondary | ICD-10-CM | POA: Insufficient documentation

## 2019-07-02 NOTE — Patient Instructions (Signed)
Nanoparticle Albumin-Bound Paclitaxel injection What is this medicine? NANOPARTICLE ALBUMIN-BOUND PACLITAXEL (Na no PAHR ti kuhl al BYOO muhn-bound PAK li TAX el) is a chemotherapy drug. It targets fast dividing cells, like cancer cells, and causes these cells to die. This medicine is used to treat advanced breast cancer, lung cancer, and pancreatic cancer. This medicine may be used for other purposes; ask your health care provider or pharmacist if you have questions. COMMON BRAND NAME(S): Abraxane What should I tell my health care provider before I take this medicine? They need to know if you have any of these conditions:  kidney disease  liver disease  low blood counts, like low white cell, platelet, or red cell counts  lung or breathing disease, like asthma  tingling of the fingers or toes, or other nerve disorder  an unusual or allergic reaction to paclitaxel, albumin, other chemotherapy, other medicines, foods, dyes, or preservatives  pregnant or trying to get pregnant  breast-feeding How should I use this medicine? This drug is given as an infusion into a vein. It is administered in a hospital or clinic by a specially trained health care professional. Talk to your pediatrician regarding the use of this medicine in children. Special care may be needed. Overdosage: If you think you have taken too much of this medicine contact a poison control center or emergency room at once. NOTE: This medicine is only for you. Do not share this medicine with others. What if I miss a dose? It is important not to miss your dose. Call your doctor or health care professional if you are unable to keep an appointment. What may interact with this medicine? This medicine may interact with the following medications:  antiviral medicines for hepatitis, HIV or AIDS  certain antibiotics like erythromycin and clarithromycin  certain medicines for fungal infections like ketoconazole and  itraconazole  certain medicines for seizures like carbamazepine, phenobarbital, phenytoin  gemfibrozil  nefazodone  rifampin  St. John's wort This list may not describe all possible interactions. Give your health care provider a list of all the medicines, herbs, non-prescription drugs, or dietary supplements you use. Also tell them if you smoke, drink alcohol, or use illegal drugs. Some items may interact with your medicine. What should I watch for while using this medicine? Your condition will be monitored carefully while you are receiving this medicine. You will need important blood work done while you are taking this medicine. This medicine can cause serious allergic reactions. If you experience allergic reactions like skin rash, itching or hives, swelling of the face, lips, or tongue, tell your doctor or health care professional right away. In some cases, you may be given additional medicines to help with side effects. Follow all directions for their use. This drug may make you feel generally unwell. This is not uncommon, as chemotherapy can affect healthy cells as well as cancer cells. Report any side effects. Continue your course of treatment even though you feel ill unless your doctor tells you to stop. Call your doctor or health care professional for advice if you get a fever, chills or sore throat, or other symptoms of a cold or flu. Do not treat yourself. This drug decreases your body's ability to fight infections. Try to avoid being around people who are sick. This medicine may increase your risk to bruise or bleed. Call your doctor or health care professional if you notice any unusual bleeding. Be careful brushing and flossing your teeth or using a toothpick because you may  get an infection or bleed more easily. If you have any dental work done, tell your dentist you are receiving this medicine. Avoid taking products that contain aspirin, acetaminophen, ibuprofen, naproxen, or  ketoprofen unless instructed by your doctor. These medicines may hide a fever. Do not become pregnant while taking this medicine or for 6 months after stopping it. Women should inform their doctor if they wish to become pregnant or think they might be pregnant. Men should not father a child while taking this medicine or for 3 months after stopping it. There is a potential for serious side effects to an unborn child. Talk to your health care professional or pharmacist for more information. Do not breast-feed an infant while taking this medicine or for 2 weeks after stopping it. This medicine may interfere with the ability to get pregnant or to father a child. You should talk to your doctor or health care professional if you are concerned about your fertility. What side effects may I notice from receiving this medicine? Side effects that you should report to your doctor or health care professional as soon as possible:  allergic reactions like skin rash, itching or hives, swelling of the face, lips, or tongue  breathing problems  changes in vision  fast, irregular heartbeat  low blood pressure  mouth sores  pain, tingling, numbness in the hands or feet  signs of decreased platelets or bleeding - bruising, pinpoint red spots on the skin, black, tarry stools, blood in the urine  signs of decreased red blood cells - unusually weak or tired, feeling faint or lightheaded, falls  signs of infection - fever or chills, cough, sore throat, pain or difficulty passing urine  signs and symptoms of liver injury like dark yellow or brown urine; general ill feeling or flu-like symptoms; light-colored stools; loss of appetite; nausea; right upper belly pain; unusually weak or tired; yellowing of the eyes or skin  swelling of the ankles, feet, hands  unusually slow heartbeat Side effects that usually do not require medical attention (report to your doctor or health care professional if they continue or  are bothersome):  diarrhea  hair loss  loss of appetite  nausea, vomiting  tiredness This list may not describe all possible side effects. Call your doctor for medical advice about side effects. You may report side effects to FDA at 1-800-FDA-1088. Where should I keep my medicine? This drug is given in a hospital or clinic and will not be stored at home. NOTE: This sheet is a summary. It may not cover all possible information. If you have questions about this medicine, talk to your doctor, pharmacist, or health care provider.  2020 Elsevier/Gold Standard (2017-03-20 13:03:45)   Carboplatin injection What is this medicine? CARBOPLATIN (KAR boe pla tin) is a chemotherapy drug. It targets fast dividing cells, like cancer cells, and causes these cells to die. This medicine is used to treat ovarian cancer and many other cancers. This medicine may be used for other purposes; ask your health care provider or pharmacist if you have questions. COMMON BRAND NAME(S): Paraplatin What should I tell my health care provider before I take this medicine? They need to know if you have any of these conditions:  blood disorders  hearing problems  kidney disease  recent or ongoing radiation therapy  an unusual or allergic reaction to carboplatin, cisplatin, other chemotherapy, other medicines, foods, dyes, or preservatives  pregnant or trying to get pregnant  breast-feeding How should I use this medicine? This  drug is usually given as an infusion into a vein. It is administered in a hospital or clinic by a specially trained health care professional. Talk to your pediatrician regarding the use of this medicine in children. Special care may be needed. Overdosage: If you think you have taken too much of this medicine contact a poison control center or emergency room at once. NOTE: This medicine is only for you. Do not share this medicine with others. What if I miss a dose? It is important not  to miss a dose. Call your doctor or health care professional if you are unable to keep an appointment. What may interact with this medicine?  medicines for seizures  medicines to increase blood counts like filgrastim, pegfilgrastim, sargramostim  some antibiotics like amikacin, gentamicin, neomycin, streptomycin, tobramycin  vaccines Talk to your doctor or health care professional before taking any of these medicines:  acetaminophen  aspirin  ibuprofen  ketoprofen  naproxen This list may not describe all possible interactions. Give your health care provider a list of all the medicines, herbs, non-prescription drugs, or dietary supplements you use. Also tell them if you smoke, drink alcohol, or use illegal drugs. Some items may interact with your medicine. What should I watch for while using this medicine? Your condition will be monitored carefully while you are receiving this medicine. You will need important blood work done while you are taking this medicine. This drug may make you feel generally unwell. This is not uncommon, as chemotherapy can affect healthy cells as well as cancer cells. Report any side effects. Continue your course of treatment even though you feel ill unless your doctor tells you to stop. In some cases, you may be given additional medicines to help with side effects. Follow all directions for their use. Call your doctor or health care professional for advice if you get a fever, chills or sore throat, or other symptoms of a cold or flu. Do not treat yourself. This drug decreases your body's ability to fight infections. Try to avoid being around people who are sick. This medicine may increase your risk to bruise or bleed. Call your doctor or health care professional if you notice any unusual bleeding. Be careful brushing and flossing your teeth or using a toothpick because you may get an infection or bleed more easily. If you have any dental work done, tell your  dentist you are receiving this medicine. Avoid taking products that contain aspirin, acetaminophen, ibuprofen, naproxen, or ketoprofen unless instructed by your doctor. These medicines may hide a fever. Do not become pregnant while taking this medicine. Women should inform their doctor if they wish to become pregnant or think they might be pregnant. There is a potential for serious side effects to an unborn child. Talk to your health care professional or pharmacist for more information. Do not breast-feed an infant while taking this medicine. What side effects may I notice from receiving this medicine? Side effects that you should report to your doctor or health care professional as soon as possible:  allergic reactions like skin rash, itching or hives, swelling of the face, lips, or tongue  signs of infection - fever or chills, cough, sore throat, pain or difficulty passing urine  signs of decreased platelets or bleeding - bruising, pinpoint red spots on the skin, black, tarry stools, nosebleeds  signs of decreased red blood cells - unusually weak or tired, fainting spells, lightheadedness  breathing problems  changes in hearing  changes in vision  chest  pain  high blood pressure  low blood counts - This drug may decrease the number of white blood cells, red blood cells and platelets. You may be at increased risk for infections and bleeding.  nausea and vomiting  pain, swelling, redness or irritation at the injection site  pain, tingling, numbness in the hands or feet  problems with balance, talking, walking  trouble passing urine or change in the amount of urine Side effects that usually do not require medical attention (report to your doctor or health care professional if they continue or are bothersome):  hair loss  loss of appetite  metallic taste in the mouth or changes in taste This list may not describe all possible side effects. Call your doctor for medical advice  about side effects. You may report side effects to FDA at 1-800-FDA-1088. Where should I keep my medicine? This drug is given in a hospital or clinic and will not be stored at home. NOTE: This sheet is a summary. It may not cover all possible information. If you have questions about this medicine, talk to your doctor, pharmacist, or health care provider.  2020 Elsevier/Gold Standard (2007-10-22 14:38:05)

## 2019-07-02 NOTE — Progress Notes (Signed)
Patient c/o knee pain ongoing. No new changes noted today

## 2019-07-04 ENCOUNTER — Other Ambulatory Visit: Payer: Self-pay

## 2019-07-04 ENCOUNTER — Encounter: Payer: Self-pay | Admitting: Radiation Oncology

## 2019-07-04 ENCOUNTER — Ambulatory Visit
Admission: RE | Admit: 2019-07-04 | Discharge: 2019-07-04 | Disposition: A | Discharge status: Home / Self Care | Payer: Medicare HMO | Source: Ambulatory Visit | Attending: Radiation Oncology | Admitting: Radiation Oncology

## 2019-07-04 VITALS — BP 133/78 | HR 98 | Temp 97.5°F

## 2019-07-04 DIAGNOSIS — Z87891 Personal history of nicotine dependence: Secondary | ICD-10-CM | POA: Diagnosis not present

## 2019-07-04 DIAGNOSIS — Z8572 Personal history of non-Hodgkin lymphomas: Secondary | ICD-10-CM | POA: Insufficient documentation

## 2019-07-04 DIAGNOSIS — Z923 Personal history of irradiation: Secondary | ICD-10-CM | POA: Insufficient documentation

## 2019-07-04 DIAGNOSIS — C3402 Malignant neoplasm of left main bronchus: Secondary | ICD-10-CM | POA: Diagnosis present

## 2019-07-04 DIAGNOSIS — C3492 Malignant neoplasm of unspecified part of left bronchus or lung: Secondary | ICD-10-CM

## 2019-07-04 NOTE — Progress Notes (Signed)
Radiation Oncology Follow up Note old patient new area recurrent lung cancer in the left hilar region  Name: Toni Griffith   Date:   07/04/2019 MRN:  [id number] DOB: 08/20/1940    This 78 y.o. female presents to the clinic today for reevaluation of left-sided lung cancer in patient treated multiple times to her chest now with progressive disease in the left hilar region causing lung atelectasis.Marland Kitchen  REFERRING PROVIDER: Francesca Oman, DO  HPI: Patient is a.  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  She had a PET CT scan in November showing Toni Griffith markedly hyperbolic left hilar mass with lymphadenopathy in both hilar regions as well as atelectasis of the peripheral lung.  Patient was not interested in rebiopsy.  She is also somewhat frustrated with all the treatments.  Recommendation for immunotherapy was made at our weekly tumor board.  She is being considered to start Pembro.  She is seen today with the help of sign language interpreter.  She specifically [place] hemoptysis has a mild mucus production producing cough.  No dysphagia.  Patient has been treated with radiation therapy using SBRT to her right upper lobe as well as her left midlung field.  She is also had salvage treatment to the left peripheral midlung field.  COMPLICATIONS OF TREATMENT: none  FOLLOW UP COMPLIANCE: keeps appointments   PHYSICAL EXAM:  BP 133/78   Pulse 98   Temp (!) 97.5 F (36.4 C)  Deaf female in NAD.  Well-developed well-nourished patient in NAD. HEENT reveals PERLA, EOMI, discs not visualized.  Oral cavity is clear. No oral mucosal lesions are identified. Neck is clear without evidence of cervical or supraclavicular adenopathy. Lungs are clear to A&P. Cardiac  examination is essentially unremarkable with regular rate and rhythm without murmur rub or thrill. Abdomen is benign with no organomegaly or masses noted. Motor sensory and DTR levels are equal and symmetric in the upper and lower extremities. Cranial nerves II through XII are grossly intact. Proprioception is intact. No peripheral adenopathy or edema is identified. No motor or sensory levels are noted. Crude visual fields are within normal range.  RADIOLOGY RESULTS: PET CT scan reviewed compatible with above-stated findings  PLAN: At this time I am believe I can offer a short course of radiation therapy 4000 cGy in 20 fractions to her left hilar region.  I can probably exclude most of previously treated left peripheral lung my treatment fields.  I feel going at a higher dose per fraction treating the areas of the left hilum and hypermetabolic nodes surrounding the esophagus would cause undue radiation esophagitis which I would like to avoid.  Risks and benefits of treatment including radiation esophagitis fatigue alteration of blood count skin reaction all were discussed in detail with the patient her daughter & interpreter.  They all seem to comprehend my treatment plan well.  I have set up and ordered CT simulation for early next week.  I have discussed case personally with medical oncology.  I would like to take this opportunity to thank you for allowing me to participate in the care of your patient.Toni Filbert, MD

## 2019-07-07 ENCOUNTER — Other Ambulatory Visit: Payer: Self-pay

## 2019-07-07 NOTE — Progress Notes (Signed)
[facility]  [address], [address] [place], [place] [address] Phone: [phone]  Fax: [phone]   Clinic Day:  07/09/2019  Referring physician: Francesca Oman, DO  Chief Complaint: Toni Griffith is a 78 y.o. 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 At that time,  she had a slight cough with mucus production.  She denied any pain.  She denies any fevers, sweats or weight loss.  She has chronic knee pain.    She was not interested in lung biopsy.  She was presented at tumor board.  She was felt to have progressive lung cancer.  We discussed continuation of pembrolizumab for her recurrent gray zone lymphoma (controlled) and palliative radiation followed by chemotherapy (carboplatin and Abraxane).  She was seen for reconsult with Dr. Baruch Gouty on 07/04/2019. She was offered a short course of radiation (4000 cGy in 20 fractions) to her left hilar region.   She underwent CT simulation on 07/08/2019.  During the interim, she saw Dr. Baruch Gouty.  She starts her radiation treatments 07/15/2019; she has 22 treatments and should complete them by 08/15/2019.  Symptomatically, she is dong well and still has chronic knee pain.  She has a rheumatology appointment on 08/13/2019.   Past Medical History:  Diagnosis Date  . Arthritis   . Cancer [facility])    lymphoma-right hip  . Deaf   . GERD (gastroesophageal reflux disease)   . Hypertension    NOT ON MED AT PRESENT  . Squamous cell lung cancer [facility])     Past Surgical History:  Procedure Laterality Date  . CARDIAC SURGERY  1993  . CORONARY ARTERY BYPASS GRAFT     DOUBLE  . CYST REMOVAL HAND Right   . ENDOBRONCHIAL ULTRASOUND N/A 05/30/2018   Procedure: ENDOBRONCHIAL ULTRASOUND;  Surgeon: Tyler Pita, MD;  Location: [facility] ORS;   Service: Cardiopulmonary;  Laterality: N/A;  . PERIPHERAL VASCULAR CATHETERIZATION N/A 04/06/2016   Procedure: Glori Luis Cath Insertion;  Surgeon: Algernon Huxley, MD;  Location: [facility];  Service: Cardiovascular;  Laterality: N/A;    Family History  Problem Relation Age of Onset  . Lung cancer Mother   . Lung cancer Father   . Diabetes Maternal Uncle   . Myasthenia gravis Maternal Grandmother   . Leukemia Maternal Grandfather     Social History:  reports that she quit smoking about 3 years ago. Her smoking use included cigarettes. She has a 80.00 pack-year smoking history. She has never used smokeless tobacco. She reports current alcohol use. She reports that she does not use drugs. She smoked 1 1/2 packs per day for 20 years (30 pack year smoking history) until 10/2015. She is hearing impaired (deaf). She lives in [place] with her boyfriend. Her daughter lives in [place]. Her daughter, [name]'s contact number is [phone]. She is living with her son. The patient is accompanied by her daughter and interpreter Remo Lipps # [phone] via video today.  Allergies: No Known Allergies  Current Medications: Current Outpatient Medications  Medication Sig Dispense Refill  . acetaminophen (TYLENOL) 500 MG tablet Take 500 mg by mouth every 6 (six) hours as needed for moderate pain.     Marland Kitchen allopurinol (ZYLOPRIM) 300 MG tablet TAKE 1 TABLET BY MOUTH EVERY DAY 90 tablet 0  . aspirin EC 81  MG tablet Take 81 mg by mouth daily.    Marland Kitchen atorvastatin (LIPITOR) 80 MG tablet Take 80 mg by mouth every evening.     Marland Kitchen b complex vitamins tablet Take 1 tablet by mouth daily.    . Cholecalciferol (VITAMIN D3) 2000 units capsule Take 2,000 Units by mouth daily.    . DULoxetine (CYMBALTA) 30 MG capsule Take 1 capsule (69m) daily for 7 days then increase to 2 capsules (659m daily.    . ferrous sulfate 325 (65 FE) MG tablet Take 650 mg by mouth daily with breakfast.    . gabapentin (NEURONTIN) 100 MG capsule Take 2  capsules (200 mg total) by mouth at bedtime. 60 capsule 3  . pantoprazole (PROTONIX) 40 MG tablet Take 40 mg by mouth daily.     . vitamin B-12 (CYANOCOBALAMIN) 500 MCG tablet Take 500 mcg by mouth daily.    . vitamin E 400 UNIT capsule Take 400 Units by mouth daily.     . Marland Kitchenidocaine-prilocaine (EMLA) cream Apply cream 1 hour before chemotherapy treatment and place small peive of saran wrap over cream to protect clothing (Patient not taking: Reported on 07/02/2019) 30 g 3  . ondansetron (ZOFRAN ODT) 4 MG disintegrating tablet Take 1 tablet (4 mg total) by mouth every 8 (eight) hours as needed for nausea or vomiting. (Patient not taking: Reported on 07/02/2019) 30 tablet 1   No current facility-administered medications for this visit.    Facility-Administered Medications Ordered in Other Visits  Medication Dose Route Frequency Provider Last Rate Last Dose  . heparin lock flush 100 unit/mL  500 Units Intravenous Once ,  C, MD      . heparin lock flush 100 unit/mL  500 Units Intravenous Once ,  C, MD      . sodium chloride flush (NS) 0.9 % injection 10 mL  10 mL Intravenous PRN CoNolon Stalls, MD   10 mL at 04/05/17 1034  . sodium chloride flush (NS) 0.9 % injection 10 mL  10 mL Intravenous PRN CoLequita AsalMD   10 mL at 02/27/19 1033  . sodium chloride flush (NS) 0.9 % injection 10 mL  10 mL Intravenous PRN CoNolon Stalls, MD   10 mL at 07/09/19 1346    Review of Systems  Constitutional: Positive for weight loss (down 1 pound). Negative for chills, diaphoresis, fever and malaise/fatigue.       Feels "ok".   HENT: Positive for hearing loss (deaf). Negative for congestion, ear pain, nosebleeds, sinus pain and sore throat.   Eyes: Negative.  Negative for blurred vision, double vision and photophobia.  Respiratory: Positive for cough, sputum production (mucus) and shortness of breath (on exertion). Negative for hemoptysis and wheezing.   Cardiovascular:  Negative.  Negative for chest pain, palpitations, orthopnea, leg swelling and PND.  Gastrointestinal: Negative.  Negative for abdominal pain, blood in stool, constipation, diarrhea, heartburn, melena, nausea and vomiting.  Genitourinary: Negative.  Negative for dysuria, flank pain, frequency, hematuria and urgency.  Musculoskeletal: Positive for joint pain (knees- chronic). Negative for back pain, myalgias and neck pain.  Skin: Negative.  Negative for itching and rash.  Neurological: Positive for sensory change (heel feels numb- stable). Negative for dizziness, tremors, speech change, focal weakness, seizures, weakness and headaches.  Endo/Heme/Allergies: Negative.  Does not bruise/bleed easily.  Psychiatric/Behavioral: Negative for depression, hallucinations and memory loss. The patient is nervous/anxious (regarding health). The patient does not have insomnia.   All other systems reviewed and are negative.  Performance status (ECOG):  1  Vitals Blood pressure (!) 147/98, pulse 96, temperature (!) 97.2 F (36.2 C), temperature source Tympanic, resp. rate 18, height '5\' 3"'  (1.6 m), weight 172 lb 3.2 oz (78.1 kg), SpO2 100 %.   Physical Exam  Constitutional: She is oriented to person, place, and time. Vital signs are normal. She appears well-developed and well-nourished. No distress.  Patient is sitting comfortably in a wheelchair.  HENT:  Head: Normocephalic and atraumatic.  Mouth/Throat: No oropharyngeal exudate.  Curly short gray hair.  Mask.  Eyes: Conjunctivae and EOM are normal. No scleral icterus.  Hazel eyes.  Neck: No JVD present.  Cardiovascular: Normal rate, regular rhythm and normal heart sounds. Exam reveals no gallop.  No murmur heard. Pulmonary/Chest: Effort normal and breath sounds normal. No respiratory distress. She has no wheezes. She has no rales.  Abdominal: Soft. Bowel sounds are normal. She exhibits no distension and no mass. There is no abdominal tenderness. There is  no rebound and no guarding.  Musculoskeletal:        General: No edema. Normal range of motion.     Cervical back: Normal range of motion and neck supple.  Lymphadenopathy:    She has no cervical adenopathy.  Neurological: She is alert and oriented to person, place, and time.  Skin: Skin is warm and dry. No rash noted. She is not diaphoretic. No erythema. No pallor.  Psychiatric: She has a normal mood and affect. Her behavior is normal. Judgment and thought content normal.  Nursing note and vitals reviewed.   Infusion on 07/09/2019  Component Date Value Ref Range Status  . WBC 07/09/2019 14.2* 4.0 - 10.5 K/uL Final  . RBC 07/09/2019 4.04  3.87 - 5.11 MIL/uL Final  . Hemoglobin 07/09/2019 13.2  12.0 - 15.0 g/dL Final  . HCT 07/09/2019 38.4  36.0 - 46.0 % Final  . MCV 07/09/2019 95.0  80.0 - 100.0 fL Final  . MCH 07/09/2019 32.7  26.0 - 34.0 pg Final  . MCHC 07/09/2019 34.4  30.0 - 36.0 g/dL Final  . RDW 07/09/2019 14.6  11.5 - 15.5 % Final  . Platelets 07/09/2019 373  150 - 400 K/uL Final  . nRBC 07/09/2019 0.0  0.0 - 0.2 % Final  . Neutrophils Relative % 07/09/2019 87  % Final  . Neutro Abs 07/09/2019 12.2* 1.7 - 7.7 K/uL Final  . Lymphocytes Relative 07/09/2019 7  % Final  . Lymphs Abs 07/09/2019 1.0  0.7 - 4.0 K/uL Final  . Monocytes Relative 07/09/2019 5  % Final  . Monocytes Absolute 07/09/2019 0.8  0.1 - 1.0 K/uL Final  . Eosinophils Relative 07/09/2019 0  % Final  . Eosinophils Absolute 07/09/2019 0.1  0.0 - 0.5 K/uL Final  . Basophils Relative 07/09/2019 0  % Final  . Basophils Absolute 07/09/2019 0.1  0.0 - 0.1 K/uL Final  . Immature Granulocytes 07/09/2019 1  % Final  . Abs Immature Granulocytes 07/09/2019 0.12* 0.00 - 0.07 K/uL Final   Performed at [facility], [address], [place], [place] [address]  . Sodium 07/09/2019 137  135 - 145 mmol/L Final  . Potassium 07/09/2019 4.2  3.5 - 5.1 mmol/L Final  . Chloride 07/09/2019 104  98 - 111 mmol/L Final   . CO2 07/09/2019 27  22 - 32 mmol/L Final  . Glucose, Bld 07/09/2019 129* 70 - 99 mg/dL Final  . BUN 07/09/2019 24* 8 - 23 mg/dL Final  . Creatinine, Ser 07/09/2019 0.76  0.44 - 1.00 mg/dL Final  . Calcium 07/09/2019 9.1  8.9 - 10.3 mg/dL Final  . Total Protein 07/09/2019 7.1  6.5 - 8.1 g/dL Final  . Albumin 07/09/2019 3.5  3.5 - 5.0 g/dL Final  . AST 07/09/2019 22  15 - 41 U/L Final  . ALT 07/09/2019 18  0 - 44 U/L Final  . Alkaline Phosphatase 07/09/2019 82  38 - 126 U/L Final  . Total Bilirubin 07/09/2019 0.7  0.3 - 1.2 mg/dL Final  . GFR calc non Af Amer 07/09/2019 >60  >60 mL/min Final  . GFR calc Af Amer 07/09/2019 >60  >60 mL/min Final  . Anion gap 07/09/2019 6  5 - 15 Final   Performed at [facility], [address], [place], [place] [address]  . Magnesium 07/09/2019 2.0  1.7 - 2.4 mg/dL Final   Performed at [facility], [address], [place], [place] [address]    Assessment:  Jaskiran Pata is a 78 y.o. female withstage IVB B-cell lymphoma, unclassifiable, with features intermediate between diffuse large B-cell lymphoma and classical Hodgkin's lymphoma ("gray zone" lymphoma). She underwent right iliac wing bone/bone marrow biopsy on 03/08/2016.  PET scanon 03/03/2016 revealed a hypermetabolic left lower lobe mass, bilateral pleural and pulmonary parenchymal nodular metastasis, a large pleural lesion invading the anterior left chest wall. There was activity in the superior left ocular orbit. There was a large lesion centered in the right iliac wing with soft tissue extension. There were hypermetabolic right external iliac lymph nodes  E[place]oon 04/04/2016 revealed an EF of 55-60%. Hepatitis B and C testing was negative on 03/30/2016. G6PD assay was normal. She declined LP with MTX prophylaxis.  She received 6 cycles of mini-RCHOP(04/07/2016 - 07/21/2016). She has had some transient fingertip numbness.  She received radiationto  the right iliac wing, from 10/11/2016 - 11/01/2016. She received 3000 cGy over 3 weeks.  PET scanon 01/29/2017 revealed interval progression of hypermetabolic nodules in the right upper (13 mm compared to 9 mm; SUV 10) and left lower lobes (26 mm compared to 22 mm; SUV 7.5). There was new hypermetabolic focus of activity along the anterior left pleura although no underlying pleural or lung mass was evident.  She has squamous cell lung cancer. Foundation Oneon the lung biopsy on 03/15/2018 revealed MS-stable, tumor mutational burden 5 Muts/Mb, AKT2 amplification, CCNE1 amplification, CREBBP R1446L, KDR amplification, KIT amplification, PDGFRA amplification, PTEN loss exons 2-4, and LK95 splice site 747+[id number]>Y. Marland Kitchen There were no alterations in EGFR, KRAS, RET, ALK, MET, ERBB2, BRAF, ROS1. PDL-1IHC analysis revealed a TPS score of 50%.  CT guided left lower lobe nodule biopsyon 02/19/2017 confirmed squamous cell carcinoma. She underwent SBRT08/27/2018 - 04/09/2017. She received 5000 cGy in 5 fractions to the left lower lobe lesion. She underwent SBRTto a RUL lesion from 05/23/2017 - 06/18/2017.  CT guided left upper lobe nodule biopsyon 02/20/2018 revealedsquamous cell lung cancer. She received SBRTfrom 04/09/2018 - 04/15/2018. She received 6000 cGy in 5 fractions.   Bronchoscopyon 05/30/2018 revealed no endobronchial lesions. The 2 cm pre-carinal lymph node was biopsied. Pathology revealed metastatic non-small cell carcinoma, favor squamous cell carcinoma.  CT guided right paraspinal lesion biopsyon 10/16/2017 was compatible with involvement by the patient's previously diagnosed lymphoma. The case was referred to [facility] hematopathology. The neoplastic cells were largely CD30 negative. Repeat CD30 immunohistochemistry faintly stained >10% of the large atypical cells. CD20 stained the scattered large atypical cells and few small cells.   She received 3 cycles  of brentuximab  vedotin(11/23/2017 - 01/11/2018).Her lymphoma progressed.  She is s/p14cycles of pembrolizumab(06/14/2018 -05/21/2019). She received a steroid Dosepak on 01/09/2019 for minor chest CT changes felt secondary to pembrolizumab.  Chest, abdomen, and pelvis CTon 06/06/2019 revealed an interval mass-like opacity in the superior aspect of the left parahilar lungwith an interval more prominent mass-like component involving the pleura laterally at the level with slightly more prominent extension into the left hila region, suspicious for recurrent or progressive lung cancer. There was mediastinal and bilateral hilar adenopathyc/wmetastatic adenopathy or involvement with the patient's B-cell lymphoma. There was no evidence of metastatic disease or lymphoma involving the abdomen or pelvis. There was significantly improved ground-glass opacities in the right lung, minimal left pleural fluid(improved), andastable large hiatal hernia.There was stable enlarged thyroid gland containing heterogeneous nodules with substernal extension on the left.There was stable to slightly decreased size of the previously demonstrated right adrenal nodule and no significant change in a small, poorly visualized left adrenal nodule.  PET scan on 06/17/2019 revealed a known left parahilar lung mass markedly hypermetabolic with associated hypermetabolic lymphadenopathy in both hilar regions, the mediastinum, and the right thoracic inlet. There was hypermetabolism in the right submandibular gland, indeterminate. There was no hypermetabolic metastatic disease evident in the abdomen or pelvis. There was a stable bilateral adrenal adenomas since 01/29/2018. There were tiny left pleural effusion and a large hiatal hernia.  There was a segment of transverse colon extending up into the hiatal hernia without obstructive features.   She hasiron deficiency anemia. Ferritinhas been followed: 14 on 05/06/2018,23 on 07/26/2018,  and 59 on 11/01/2018. She denies any bleeding.  She has a grade I neuropathy. B12 and folate were normal on 12/06/2017. She has severe bilateral knee arthritis. She has received steroid injections and Monovisc (hyaluronate derivatives) without improvement.   She is deafand requires an interpreter.  Symptomatically, she denies any respiratory symptoms.  She denies any B symptoms.  She has chronic knee issues.  Plan: 1.   Labs today: CBC with diff, CMP, Mg, TSH. 2.   Pearline Cables zone lymphoma Patient is s/p 6 cycles of mini-RCOP Patient is s/p 3 cycles ofbrentuximab vedotin. Patient is s/p 14 cycles of pembrolizumab. Clinically,sheshe denies any B symptoms. Exam reveals no palpable adenopathy or  hepatosplenomegaly. Chest, abdomen and pelvis CT on 06/06/2019 revealed mediastinal and bilateral hilar adenopathy. PET scan on 06/17/2019 revealed progressive disease in the chest only. Etiology felt secondary to progressive lung cancer.             Patient declined biopsy. Discuss plan for palliative chest radiation.  Radiation planned from 07/15/2019 - 08/15/2019. Patient had documented progressive lymphoma involving the right paraspinal region on 09/2017. Labs reviewed.  Pembrolizumab today. Plan to continue maintenance pembrolizumab to prevent emergence of active lymphoma             Pembrolizumab every 3 weeks.  Plan to hold in 3 weeks (restart in 6 weeks after radiation). 3. Squamous cell lung cancer Patient is s/p SBRT to left upper lobe and left lower lobe nodules. Patient is s/p 14 cycle of pembrolizumab. Clinically,she has a slight cough with mucus production. Chest, abdomen, and pelvis CT on 06/06/2019 revealed mass-like opacity in the superior aspect of the left parahilar lung, mediastinal and bilateral hilar adenopathy.       PET scan on 06/17/2019 revealed a left parahilar lung mass (SUV 16.9) with hypermetabolic bilateral hilar  adenopathy, the mediastinum, and the right thoracic inlet.  Patient declined biopsy.  Etiology felt secondary to progressive lung cancer.        She has a tiny new left effusion (unclear significance- if positive M1a).                   Clinical stage T4N3Mx (stage IIIC).       Review plan for palliative radiation (07/15/2019 - 08/15/2019). Review plan for carboplatin and Abraxane following completion of radiation.                   Chemotherapy schedule re-reviewed.                               Abraxane 100 mg/m2 IV on days 1,8, and 15.                               Carboplatin AUC 5-6 on day 1                               Cycle length every 21 days.                               Pembrolizumab incorporated for maintenance of gray zone lymphoma on day 1      NEJM 2018 Nov 22; [phone] 2051.  Several questions asked and answered. 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 Continue to monitor.. 5. B12 deficiency Patient remains on oral B12. B12 and folate were normal on 12/17/2018. Check periodically. 6.Bilateral knee pain She isscheduled tosee Dr. Harrel Carina, rheumatologist on 08/13/2019. She has baseline arthritis which may be exacerbated by pembrolizumab. Incidenceof arthritis 2%, myalgias 12% and arthralgias 10-18%.             Continue to monitor. 7.   RTC on 08/20/2019 for MD assessment, labs (CBC with diff, CMP, CA27.29), pembrolizumab, and cycle #1 carboplatin, and Abraxane.  I discussed the assessment and treatment plan with the patient.  The patient was provided an opportunity to ask questions and all were answered.  The patient agreed with the plan and demonstrated an understanding of the instructions.  The patient was advised to call back if the symptoms worsen or if the condition fails to improve as anticipated.  I provided 19  minutes (2:20 PM - 2:38 PM) of face-to-face time during this this encounter and > 50% was spent counseling as documented under my assessment and plan.    Lequita Asal, MD, PhD    07/09/2019, 2:38 PM  I, Samul Dada, am acting as a scribe for Lequita Asal, MD.  I, [place] Gip, MD, have reviewed the above documentation for accuracy and completeness, and I agree with the above.

## 2019-07-08 ENCOUNTER — Ambulatory Visit
Admission: RE | Admit: 2019-07-08 | Discharge: 2019-07-08 | Disposition: A | Discharge status: Home / Self Care | Payer: Medicare HMO | Source: Ambulatory Visit | Attending: Radiation Oncology | Admitting: Radiation Oncology

## 2019-07-08 ENCOUNTER — Other Ambulatory Visit: Payer: Self-pay

## 2019-07-08 DIAGNOSIS — C3402 Malignant neoplasm of left main bronchus: Secondary | ICD-10-CM | POA: Diagnosis present

## 2019-07-08 DIAGNOSIS — Z510 Encounter for antineoplastic radiation therapy: Secondary | ICD-10-CM | POA: Diagnosis not present

## 2019-07-09 ENCOUNTER — Encounter: Payer: Self-pay | Admitting: Hematology and Oncology

## 2019-07-09 ENCOUNTER — Inpatient Hospital Stay: Payer: Medicare HMO

## 2019-07-09 ENCOUNTER — Inpatient Hospital Stay (HOSPITAL_BASED_OUTPATIENT_CLINIC_OR_DEPARTMENT_OTHER): Payer: Medicare HMO | Admitting: Hematology and Oncology

## 2019-07-09 VITALS — BP 140/90 | HR 105 | Resp 18

## 2019-07-09 VITALS — BP 147/98 | HR 96 | Temp 97.2°F | Resp 18 | Ht 63.0 in | Wt 172.2 lb

## 2019-07-09 DIAGNOSIS — G620 Drug-induced polyneuropathy: Secondary | ICD-10-CM

## 2019-07-09 DIAGNOSIS — M25562 Pain in left knee: Secondary | ICD-10-CM

## 2019-07-09 DIAGNOSIS — Z5112 Encounter for antineoplastic immunotherapy: Secondary | ICD-10-CM | POA: Diagnosis not present

## 2019-07-09 DIAGNOSIS — M25561 Pain in right knee: Secondary | ICD-10-CM | POA: Diagnosis not present

## 2019-07-09 DIAGNOSIS — G8929 Other chronic pain: Secondary | ICD-10-CM

## 2019-07-09 DIAGNOSIS — T451X5A Adverse effect of antineoplastic and immunosuppressive drugs, initial encounter: Secondary | ICD-10-CM

## 2019-07-09 DIAGNOSIS — C8518 Unspecified B-cell lymphoma, lymph nodes of multiple sites: Secondary | ICD-10-CM | POA: Diagnosis not present

## 2019-07-09 DIAGNOSIS — Z7189 Other specified counseling: Secondary | ICD-10-CM

## 2019-07-09 DIAGNOSIS — C3490 Malignant neoplasm of unspecified part of unspecified bronchus or lung: Secondary | ICD-10-CM

## 2019-07-09 DIAGNOSIS — Z510 Encounter for antineoplastic radiation therapy: Secondary | ICD-10-CM | POA: Diagnosis not present

## 2019-07-09 DIAGNOSIS — C8590 Non-Hodgkin lymphoma, unspecified, unspecified site: Secondary | ICD-10-CM

## 2019-07-09 LAB — TSH: TSH: 0.636 u[IU]/mL (ref 0.350–4.500)

## 2019-07-09 LAB — CBC WITH DIFFERENTIAL/PLATELET
Abs Immature Granulocytes: 0.12 10*3/uL — ABNORMAL HIGH (ref 0.00–0.07)
Basophils Absolute: 0.1 10*3/uL (ref 0.0–0.1)
Basophils Relative: 0 %
Eosinophils Absolute: 0.1 10*3/uL (ref 0.0–0.5)
Eosinophils Relative: 0 %
HCT: 38.4 % (ref 36.0–46.0)
Hemoglobin: 13.2 g/dL (ref 12.0–15.0)
Immature Granulocytes: 1 %
Lymphocytes Relative: 7 %
Lymphs Abs: 1 10*3/uL (ref 0.7–4.0)
MCH: 32.7 pg (ref 26.0–34.0)
MCHC: 34.4 g/dL (ref 30.0–36.0)
MCV: 95 fL (ref 80.0–100.0)
Monocytes Absolute: 0.8 10*3/uL (ref 0.1–1.0)
Monocytes Relative: 5 %
Neutro Abs: 12.2 10*3/uL — ABNORMAL HIGH (ref 1.7–7.7)
Neutrophils Relative %: 87 %
Platelets: 373 10*3/uL (ref 150–400)
RBC: 4.04 MIL/uL (ref 3.87–5.11)
RDW: 14.6 % (ref 11.5–15.5)
WBC: 14.2 10*3/uL — ABNORMAL HIGH (ref 4.0–10.5)
nRBC: 0 % (ref 0.0–0.2)

## 2019-07-09 LAB — COMPREHENSIVE METABOLIC PANEL
ALT: 18 U/L (ref 0–44)
AST: 22 U/L (ref 15–41)
Albumin: 3.5 g/dL (ref 3.5–5.0)
Alkaline Phosphatase: 82 U/L (ref 38–126)
Anion gap: 6 (ref 5–15)
BUN: 24 mg/dL — ABNORMAL HIGH (ref 8–23)
CO2: 27 mmol/L (ref 22–32)
Calcium: 9.1 mg/dL (ref 8.9–10.3)
Chloride: 104 mmol/L (ref 98–111)
Creatinine, Ser: 0.76 mg/dL (ref 0.44–1.00)
GFR calc Af Amer: >60 mL/min (ref >60)
GFR calc non Af Amer: >60 mL/min (ref >60)
Glucose, Bld: 129 mg/dL — ABNORMAL HIGH (ref 70–99)
Potassium: 4.2 mmol/L (ref 3.5–5.1)
Sodium: 137 mmol/L (ref 135–145)
Total Bilirubin: 0.7 mg/dL (ref 0.3–1.2)
Total Protein: 7.1 g/dL (ref 6.5–8.1)

## 2019-07-09 LAB — MAGNESIUM: Magnesium: 2 mg/dL (ref 1.7–2.4)

## 2019-07-09 MED ORDER — SODIUM CHLORIDE 0.9% FLUSH
10.0000 mL | INTRAVENOUS | Status: DC | PRN
  Administered 2019-07-09 (×2): 10 mL via INTRAVENOUS
  Filled 2019-07-09: qty 10

## 2019-07-09 MED ORDER — SODIUM CHLORIDE 0.9 % IV SOLN
200.0000 mg | Freq: Once | INTRAVENOUS | Status: AC
  Administered 2019-07-09: 200 mg via INTRAVENOUS
  Filled 2019-07-09: qty 8

## 2019-07-09 MED ORDER — ONDANSETRON HCL 4 MG PO TABS
8.0000 mg | ORAL_TABLET | Freq: Once | ORAL | Status: AC
  Administered 2019-07-09: 8 mg via ORAL
  Filled 2019-07-09: qty 2

## 2019-07-09 MED ORDER — HEPARIN SOD (PORK) LOCK FLUSH 100 UNIT/ML IV SOLN
500.0000 [IU] | Freq: Once | INTRAVENOUS | Status: AC
  Administered 2019-07-09: 500 [IU] via INTRAVENOUS
  Filled 2019-07-09: qty 5

## 2019-07-09 MED ORDER — SODIUM CHLORIDE 0.9 % IV SOLN
Freq: Once | INTRAVENOUS | Status: AC
  Administered 2019-07-09: 15:00:00 via INTRAVENOUS
  Filled 2019-07-09: qty 250

## 2019-07-09 NOTE — Progress Notes (Signed)
No new changes noted today. ( Knee pain) Patient c/o coughing up sputum ( white) x 3-4 weeks, but increase more.

## 2019-07-09 NOTE — Patient Instructions (Signed)
Pembrolizumab injection What is this medicine? PEMBROLIZUMAB (pem broe liz ue mab) is a monoclonal antibody. It is used to treat bladder cancer, cervical cancer, endometrial cancer, esophageal cancer, head and neck cancer, hepatocellular cancer, Hodgkin lymphoma, kidney cancer, lymphoma, melanoma, Merkel cell carcinoma, lung cancer, stomach cancer, urothelial cancer, and cancers that have a certain genetic condition. This medicine may be used for other purposes; ask your health care provider or pharmacist if you have questions. COMMON BRAND NAME(S): Keytruda What should I tell my health care provider before I take this medicine? They need to know if you have any of these conditions:  diabetes  immune system problems  inflammatory bowel disease  liver disease  lung or breathing disease  lupus  received or scheduled to receive an organ transplant or a stem-cell transplant that uses donor stem cells  an unusual or allergic reaction to pembrolizumab, other medicines, foods, dyes, or preservatives  pregnant or trying to get pregnant  breast-feeding How should I use this medicine? This medicine is for infusion into a vein. It is given by a health care professional in a hospital or clinic setting. A special MedGuide will be given to you before each treatment. Be sure to read this information carefully each time. Talk to your pediatrician regarding the use of this medicine in children. While this drug may be prescribed for selected conditions, precautions do apply. Overdosage: If you think you have taken too much of this medicine contact a poison control center or emergency room at once. NOTE: This medicine is only for you. Do not share this medicine with others. What if I miss a dose? It is important not to miss your dose. Call your doctor or health care professional if you are unable to keep an appointment. What may interact with this medicine? Interactions have not been studied. Give  your health care provider a list of all the medicines, herbs, non-prescription drugs, or dietary supplements you use. Also tell them if you smoke, drink alcohol, or use illegal drugs. Some items may interact with your medicine. This list may not describe all possible interactions. Give your health care provider a list of all the medicines, herbs, non-prescription drugs, or dietary supplements you use. Also tell them if you smoke, drink alcohol, or use illegal drugs. Some items may interact with your medicine. What should I watch for while using this medicine? Your condition will be monitored carefully while you are receiving this medicine. You may need blood work done while you are taking this medicine. Do not become pregnant while taking this medicine or for 4 months after stopping it. Women should inform their doctor if they wish to become pregnant or think they might be pregnant. There is a potential for serious side effects to an unborn child. Talk to your health care professional or pharmacist for more information. Do not breast-feed an infant while taking this medicine or for 4 months after the last dose. What side effects may I notice from receiving this medicine? Side effects that you should report to your doctor or health care professional as soon as possible:  allergic reactions like skin rash, itching or hives, swelling of the face, lips, or tongue  bloody or black, tarry  breathing problems  changes in vision  chest pain  chills  confusion  constipation  cough  diarrhea  dizziness or feeling faint or lightheaded  fast or irregular heartbeat  fever  flushing  hair loss  joint pain  low blood counts - this  medicine may decrease the number of white blood cells, red blood cells and platelets. You may be at increased risk for infections and bleeding.  muscle pain  muscle weakness  persistent headache  redness, blistering, peeling or loosening of the skin,  including inside the mouth  signs and symptoms of high blood sugar such as dizziness; dry mouth; dry skin; fruity breath; nausea; stomach pain; increased hunger or thirst; increased urination  signs and symptoms of kidney injury like trouble passing urine or change in the amount of urine  signs and symptoms of liver injury like dark urine, light-colored stools, loss of appetite, nausea, right upper belly pain, yellowing of the eyes or skin  sweating  swollen lymph nodes  weight loss Side effects that usually do not require medical attention (report to your doctor or health care professional if they continue or are bothersome):  decreased appetite  muscle pain  tiredness This list may not describe all possible side effects. Call your doctor for medical advice about side effects. You may report side effects to FDA at 1-800-FDA-1088. Where should I keep my medicine? This drug is given in a hospital or clinic and will not be stored at home. NOTE: This sheet is a summary. It may not cover all possible information. If you have questions about this medicine, talk to your doctor, pharmacist, or health care provider.  2020 [employer] Standard (2018-08-13 13:46:58)

## 2019-07-10 ENCOUNTER — Other Ambulatory Visit: Payer: Self-pay | Admitting: *Deleted

## 2019-07-10 DIAGNOSIS — C3492 Malignant neoplasm of unspecified part of left bronchus or lung: Secondary | ICD-10-CM

## 2019-07-14 ENCOUNTER — Other Ambulatory Visit: Payer: Self-pay

## 2019-07-14 ENCOUNTER — Ambulatory Visit
Admission: RE | Admit: 2019-07-14 | Discharge: 2019-07-14 | Disposition: A | Discharge status: Home / Self Care | Payer: Medicare HMO | Source: Ambulatory Visit | Attending: Radiation Oncology | Admitting: Radiation Oncology

## 2019-07-14 DIAGNOSIS — Z510 Encounter for antineoplastic radiation therapy: Secondary | ICD-10-CM | POA: Diagnosis not present

## 2019-07-15 ENCOUNTER — Ambulatory Visit: Payer: Medicare HMO

## 2019-07-15 ENCOUNTER — Ambulatory Visit
Admission: RE | Admit: 2019-07-15 | Discharge: 2019-07-15 | Disposition: A | Discharge status: Home / Self Care | Payer: Medicare HMO | Source: Ambulatory Visit | Attending: Radiation Oncology | Admitting: Radiation Oncology

## 2019-07-15 ENCOUNTER — Other Ambulatory Visit: Payer: Self-pay | Admitting: Hematology and Oncology

## 2019-07-15 ENCOUNTER — Other Ambulatory Visit: Payer: Self-pay

## 2019-07-15 DIAGNOSIS — C8330 Diffuse large B-cell lymphoma, unspecified site: Secondary | ICD-10-CM

## 2019-07-15 DIAGNOSIS — R911 Solitary pulmonary nodule: Secondary | ICD-10-CM

## 2019-07-15 DIAGNOSIS — C8198 Hodgkin lymphoma, unspecified, lymph nodes of multiple sites: Secondary | ICD-10-CM

## 2019-07-15 DIAGNOSIS — Z510 Encounter for antineoplastic radiation therapy: Secondary | ICD-10-CM | POA: Diagnosis not present

## 2019-07-15 DIAGNOSIS — E876 Hypokalemia: Secondary | ICD-10-CM

## 2019-07-15 DIAGNOSIS — C7951 Secondary malignant neoplasm of bone: Secondary | ICD-10-CM

## 2019-07-16 ENCOUNTER — Ambulatory Visit
Admission: RE | Admit: 2019-07-16 | Discharge: 2019-07-16 | Disposition: A | Discharge status: Home / Self Care | Payer: Medicare HMO | Source: Ambulatory Visit | Attending: Radiation Oncology | Admitting: Radiation Oncology

## 2019-07-16 ENCOUNTER — Other Ambulatory Visit: Payer: Self-pay

## 2019-07-16 DIAGNOSIS — Z510 Encounter for antineoplastic radiation therapy: Secondary | ICD-10-CM | POA: Diagnosis not present

## 2019-07-17 ENCOUNTER — Other Ambulatory Visit: Payer: Self-pay

## 2019-07-17 ENCOUNTER — Ambulatory Visit
Admission: RE | Admit: 2019-07-17 | Discharge: 2019-07-17 | Disposition: A | Discharge status: Home / Self Care | Payer: Medicare HMO | Source: Ambulatory Visit | Attending: Radiation Oncology | Admitting: Radiation Oncology

## 2019-07-17 DIAGNOSIS — Z510 Encounter for antineoplastic radiation therapy: Secondary | ICD-10-CM | POA: Diagnosis not present

## 2019-07-18 ENCOUNTER — Other Ambulatory Visit: Payer: Self-pay

## 2019-07-18 ENCOUNTER — Ambulatory Visit
Admission: RE | Admit: 2019-07-18 | Discharge: 2019-07-18 | Disposition: A | Discharge status: Home / Self Care | Payer: Medicare HMO | Source: Ambulatory Visit | Attending: Radiation Oncology | Admitting: Radiation Oncology

## 2019-07-18 DIAGNOSIS — Z510 Encounter for antineoplastic radiation therapy: Secondary | ICD-10-CM | POA: Diagnosis not present

## 2019-07-21 ENCOUNTER — Other Ambulatory Visit: Payer: Self-pay

## 2019-07-21 ENCOUNTER — Ambulatory Visit
Admission: RE | Admit: 2019-07-21 | Discharge: 2019-07-21 | Disposition: A | Discharge status: Home / Self Care | Payer: Medicare HMO | Source: Ambulatory Visit | Attending: Radiation Oncology | Admitting: Radiation Oncology

## 2019-07-21 DIAGNOSIS — Z510 Encounter for antineoplastic radiation therapy: Secondary | ICD-10-CM | POA: Diagnosis not present

## 2019-07-22 ENCOUNTER — Other Ambulatory Visit: Payer: Self-pay

## 2019-07-22 ENCOUNTER — Ambulatory Visit
Admission: RE | Admit: 2019-07-22 | Discharge: 2019-07-22 | Disposition: A | Discharge status: Home / Self Care | Payer: Medicare HMO | Source: Ambulatory Visit | Attending: Radiation Oncology | Admitting: Radiation Oncology

## 2019-07-22 DIAGNOSIS — Z510 Encounter for antineoplastic radiation therapy: Secondary | ICD-10-CM | POA: Diagnosis not present

## 2019-07-23 ENCOUNTER — Other Ambulatory Visit: Payer: Self-pay

## 2019-07-23 ENCOUNTER — Ambulatory Visit
Admission: RE | Admit: 2019-07-23 | Discharge: 2019-07-23 | Disposition: A | Discharge status: Home / Self Care | Payer: Medicare HMO | Source: Ambulatory Visit | Attending: Radiation Oncology | Admitting: Radiation Oncology

## 2019-07-23 DIAGNOSIS — Z510 Encounter for antineoplastic radiation therapy: Secondary | ICD-10-CM | POA: Diagnosis not present

## 2019-07-24 ENCOUNTER — Other Ambulatory Visit: Payer: Self-pay

## 2019-07-24 ENCOUNTER — Ambulatory Visit
Admission: RE | Admit: 2019-07-24 | Discharge: 2019-07-24 | Disposition: A | Discharge status: Home / Self Care | Payer: Medicare HMO | Source: Ambulatory Visit | Attending: Radiation Oncology | Admitting: Radiation Oncology

## 2019-07-24 DIAGNOSIS — Z510 Encounter for antineoplastic radiation therapy: Secondary | ICD-10-CM | POA: Diagnosis not present

## 2019-07-28 ENCOUNTER — Ambulatory Visit
Admission: RE | Admit: 2019-07-28 | Discharge: 2019-07-28 | Disposition: A | Discharge status: Home / Self Care | Payer: Medicare HMO | Source: Ambulatory Visit | Attending: Radiation Oncology | Admitting: Radiation Oncology

## 2019-07-28 ENCOUNTER — Other Ambulatory Visit: Payer: Self-pay

## 2019-07-28 DIAGNOSIS — Z510 Encounter for antineoplastic radiation therapy: Secondary | ICD-10-CM | POA: Diagnosis not present

## 2019-07-29 ENCOUNTER — Other Ambulatory Visit: Payer: Self-pay

## 2019-07-29 ENCOUNTER — Ambulatory Visit
Admission: RE | Admit: 2019-07-29 | Discharge: 2019-07-29 | Disposition: A | Discharge status: Home / Self Care | Payer: Medicare HMO | Source: Ambulatory Visit | Attending: Radiation Oncology | Admitting: Radiation Oncology

## 2019-07-29 ENCOUNTER — Inpatient Hospital Stay: Payer: Medicare HMO

## 2019-07-29 DIAGNOSIS — Z5112 Encounter for antineoplastic immunotherapy: Secondary | ICD-10-CM | POA: Diagnosis not present

## 2019-07-29 DIAGNOSIS — C3492 Malignant neoplasm of unspecified part of left bronchus or lung: Secondary | ICD-10-CM

## 2019-07-29 DIAGNOSIS — Z510 Encounter for antineoplastic radiation therapy: Secondary | ICD-10-CM | POA: Diagnosis not present

## 2019-07-29 LAB — CBC
HCT: 40 % (ref 36.0–46.0)
Hemoglobin: 13.1 g/dL (ref 12.0–15.0)
MCH: 32.5 pg (ref 26.0–34.0)
MCHC: 32.8 g/dL (ref 30.0–36.0)
MCV: 99.3 fL (ref 80.0–100.0)
Platelets: 346 10*3/uL (ref 150–400)
RBC: 4.03 MIL/uL (ref 3.87–5.11)
RDW: 15.8 % — ABNORMAL HIGH (ref 11.5–15.5)
WBC: 11.6 10*3/uL — ABNORMAL HIGH (ref 4.0–10.5)
nRBC: 0 % (ref 0.0–0.2)

## 2019-07-30 ENCOUNTER — Ambulatory Visit
Admission: RE | Admit: 2019-07-30 | Discharge: 2019-07-30 | Disposition: A | Discharge status: Home / Self Care | Payer: Medicare HMO | Source: Ambulatory Visit | Attending: Radiation Oncology | Admitting: Radiation Oncology

## 2019-07-30 ENCOUNTER — Other Ambulatory Visit: Payer: Self-pay

## 2019-07-30 DIAGNOSIS — Z510 Encounter for antineoplastic radiation therapy: Secondary | ICD-10-CM | POA: Diagnosis not present

## 2019-07-31 ENCOUNTER — Ambulatory Visit
Admission: RE | Admit: 2019-07-31 | Discharge: 2019-07-31 | Disposition: A | Discharge status: Home / Self Care | Payer: Medicare HMO | Source: Ambulatory Visit | Attending: Radiation Oncology | Admitting: Radiation Oncology

## 2019-07-31 ENCOUNTER — Other Ambulatory Visit: Payer: Self-pay

## 2019-07-31 DIAGNOSIS — Z510 Encounter for antineoplastic radiation therapy: Secondary | ICD-10-CM | POA: Diagnosis not present

## 2019-08-04 ENCOUNTER — Ambulatory Visit
Admission: RE | Admit: 2019-08-04 | Discharge: 2019-08-04 | Disposition: A | Discharge status: Home / Self Care | Payer: Medicare HMO | Source: Ambulatory Visit | Attending: Radiation Oncology | Admitting: Radiation Oncology

## 2019-08-04 ENCOUNTER — Other Ambulatory Visit: Payer: Self-pay

## 2019-08-04 DIAGNOSIS — Z510 Encounter for antineoplastic radiation therapy: Secondary | ICD-10-CM | POA: Insufficient documentation

## 2019-08-04 DIAGNOSIS — C3402 Malignant neoplasm of left main bronchus: Secondary | ICD-10-CM | POA: Insufficient documentation

## 2019-08-05 ENCOUNTER — Other Ambulatory Visit: Payer: Self-pay

## 2019-08-05 ENCOUNTER — Ambulatory Visit
Admission: RE | Admit: 2019-08-05 | Discharge: 2019-08-05 | Disposition: A | Discharge status: Home / Self Care | Payer: Medicare HMO | Source: Ambulatory Visit | Attending: Radiation Oncology | Admitting: Radiation Oncology

## 2019-08-05 DIAGNOSIS — Z510 Encounter for antineoplastic radiation therapy: Secondary | ICD-10-CM | POA: Diagnosis not present

## 2019-08-06 ENCOUNTER — Ambulatory Visit
Admission: RE | Admit: 2019-08-06 | Discharge: 2019-08-06 | Disposition: A | Discharge status: Home / Self Care | Payer: Medicare HMO | Source: Ambulatory Visit | Attending: Radiation Oncology | Admitting: Radiation Oncology

## 2019-08-06 ENCOUNTER — Other Ambulatory Visit: Payer: Self-pay

## 2019-08-06 DIAGNOSIS — Z510 Encounter for antineoplastic radiation therapy: Secondary | ICD-10-CM | POA: Diagnosis not present

## 2019-08-07 ENCOUNTER — Other Ambulatory Visit: Payer: Self-pay

## 2019-08-07 ENCOUNTER — Ambulatory Visit
Admission: RE | Admit: 2019-08-07 | Discharge: 2019-08-07 | Disposition: A | Discharge status: Home / Self Care | Payer: Medicare HMO | Source: Ambulatory Visit | Attending: Radiation Oncology | Admitting: Radiation Oncology

## 2019-08-07 DIAGNOSIS — Z510 Encounter for antineoplastic radiation therapy: Secondary | ICD-10-CM | POA: Diagnosis not present

## 2019-08-08 ENCOUNTER — Ambulatory Visit
Admission: RE | Admit: 2019-08-08 | Discharge: 2019-08-08 | Disposition: A | Discharge status: Home / Self Care | Payer: Medicare HMO | Source: Ambulatory Visit | Attending: Radiation Oncology | Admitting: Radiation Oncology

## 2019-08-08 ENCOUNTER — Other Ambulatory Visit: Payer: Self-pay

## 2019-08-08 DIAGNOSIS — Z510 Encounter for antineoplastic radiation therapy: Secondary | ICD-10-CM | POA: Diagnosis not present

## 2019-08-11 ENCOUNTER — Ambulatory Visit
Admission: RE | Admit: 2019-08-11 | Discharge: 2019-08-11 | Disposition: A | Discharge status: Home / Self Care | Payer: Medicare HMO | Source: Ambulatory Visit | Attending: Radiation Oncology | Admitting: Radiation Oncology

## 2019-08-11 ENCOUNTER — Other Ambulatory Visit: Payer: Self-pay

## 2019-08-11 DIAGNOSIS — Z510 Encounter for antineoplastic radiation therapy: Secondary | ICD-10-CM | POA: Diagnosis not present

## 2019-08-12 ENCOUNTER — Other Ambulatory Visit: Payer: Self-pay

## 2019-08-12 ENCOUNTER — Inpatient Hospital Stay: Payer: Medicare HMO | Attending: Hematology and Oncology

## 2019-08-12 ENCOUNTER — Ambulatory Visit
Admission: RE | Admit: 2019-08-12 | Discharge: 2019-08-12 | Disposition: A | Discharge status: Home / Self Care | Payer: Medicare HMO | Source: Ambulatory Visit | Attending: Radiation Oncology | Admitting: Radiation Oncology

## 2019-08-12 DIAGNOSIS — K449 Diaphragmatic hernia without obstruction or gangrene: Secondary | ICD-10-CM | POA: Diagnosis not present

## 2019-08-12 DIAGNOSIS — Z79899 Other long term (current) drug therapy: Secondary | ICD-10-CM | POA: Insufficient documentation

## 2019-08-12 DIAGNOSIS — Z5112 Encounter for antineoplastic immunotherapy: Secondary | ICD-10-CM | POA: Insufficient documentation

## 2019-08-12 DIAGNOSIS — E876 Hypokalemia: Secondary | ICD-10-CM | POA: Diagnosis not present

## 2019-08-12 DIAGNOSIS — D3501 Benign neoplasm of right adrenal gland: Secondary | ICD-10-CM | POA: Insufficient documentation

## 2019-08-12 DIAGNOSIS — Z806 Family history of leukemia: Secondary | ICD-10-CM | POA: Diagnosis not present

## 2019-08-12 DIAGNOSIS — C8330 Diffuse large B-cell lymphoma, unspecified site: Secondary | ICD-10-CM | POA: Diagnosis not present

## 2019-08-12 DIAGNOSIS — T451X5A Adverse effect of antineoplastic and immunosuppressive drugs, initial encounter: Secondary | ICD-10-CM | POA: Insufficient documentation

## 2019-08-12 DIAGNOSIS — Z5111 Encounter for antineoplastic chemotherapy: Secondary | ICD-10-CM | POA: Insufficient documentation

## 2019-08-12 DIAGNOSIS — Z87891 Personal history of nicotine dependence: Secondary | ICD-10-CM | POA: Diagnosis not present

## 2019-08-12 DIAGNOSIS — M170 Bilateral primary osteoarthritis of knee: Secondary | ICD-10-CM | POA: Insufficient documentation

## 2019-08-12 DIAGNOSIS — Z510 Encounter for antineoplastic radiation therapy: Secondary | ICD-10-CM | POA: Diagnosis not present

## 2019-08-12 DIAGNOSIS — G620 Drug-induced polyneuropathy: Secondary | ICD-10-CM | POA: Diagnosis not present

## 2019-08-12 DIAGNOSIS — R61 Generalized hyperhidrosis: Secondary | ICD-10-CM | POA: Insufficient documentation

## 2019-08-12 DIAGNOSIS — Z801 Family history of malignant neoplasm of trachea, bronchus and lung: Secondary | ICD-10-CM | POA: Diagnosis not present

## 2019-08-12 DIAGNOSIS — R222 Localized swelling, mass and lump, trunk: Secondary | ICD-10-CM | POA: Insufficient documentation

## 2019-08-12 DIAGNOSIS — R208 Other disturbances of skin sensation: Secondary | ICD-10-CM | POA: Diagnosis not present

## 2019-08-12 DIAGNOSIS — C3412 Malignant neoplasm of upper lobe, left bronchus or lung: Secondary | ICD-10-CM | POA: Diagnosis present

## 2019-08-12 DIAGNOSIS — E538 Deficiency of other specified B group vitamins: Secondary | ICD-10-CM | POA: Diagnosis not present

## 2019-08-12 DIAGNOSIS — C3432 Malignant neoplasm of lower lobe, left bronchus or lung: Secondary | ICD-10-CM | POA: Diagnosis present

## 2019-08-12 DIAGNOSIS — D3502 Benign neoplasm of left adrenal gland: Secondary | ICD-10-CM | POA: Insufficient documentation

## 2019-08-12 DIAGNOSIS — H9190 Unspecified hearing loss, unspecified ear: Secondary | ICD-10-CM | POA: Insufficient documentation

## 2019-08-12 DIAGNOSIS — Z833 Family history of diabetes mellitus: Secondary | ICD-10-CM | POA: Diagnosis not present

## 2019-08-12 DIAGNOSIS — C3492 Malignant neoplasm of unspecified part of left bronchus or lung: Secondary | ICD-10-CM

## 2019-08-12 LAB — CBC
HCT: 40.3 % (ref 36.0–46.0)
Hemoglobin: 12.7 g/dL (ref 12.0–15.0)
MCH: 32 pg (ref 26.0–34.0)
MCHC: 31.5 g/dL (ref 30.0–36.0)
MCV: 101.5 fL — ABNORMAL HIGH (ref 80.0–100.0)
Platelets: 305 10*3/uL (ref 150–400)
RBC: 3.97 MIL/uL (ref 3.87–5.11)
RDW: 16.1 % — ABNORMAL HIGH (ref 11.5–15.5)
WBC: 9.1 10*3/uL (ref 4.0–10.5)
nRBC: 0 % (ref 0.0–0.2)

## 2019-08-13 ENCOUNTER — Ambulatory Visit
Admission: RE | Admit: 2019-08-13 | Discharge: 2019-08-13 | Disposition: A | Discharge status: Home / Self Care | Payer: Medicare HMO | Source: Ambulatory Visit | Attending: Radiation Oncology | Admitting: Radiation Oncology

## 2019-08-13 ENCOUNTER — Other Ambulatory Visit: Payer: Self-pay

## 2019-08-13 DIAGNOSIS — Z510 Encounter for antineoplastic radiation therapy: Secondary | ICD-10-CM | POA: Diagnosis not present

## 2019-08-14 ENCOUNTER — Other Ambulatory Visit: Payer: Self-pay

## 2019-08-14 ENCOUNTER — Ambulatory Visit
Admission: RE | Admit: 2019-08-14 | Discharge: 2019-08-14 | Disposition: A | Discharge status: Home / Self Care | Payer: Medicare HMO | Source: Ambulatory Visit | Attending: Radiation Oncology | Admitting: Radiation Oncology

## 2019-08-14 DIAGNOSIS — Z510 Encounter for antineoplastic radiation therapy: Secondary | ICD-10-CM | POA: Diagnosis not present

## 2019-08-15 ENCOUNTER — Ambulatory Visit
Admission: RE | Admit: 2019-08-15 | Discharge: 2019-08-15 | Disposition: A | Discharge status: Home / Self Care | Payer: Medicare HMO | Source: Ambulatory Visit | Attending: Radiation Oncology | Admitting: Radiation Oncology

## 2019-08-15 ENCOUNTER — Other Ambulatory Visit: Payer: Self-pay | Admitting: Hematology and Oncology

## 2019-08-15 ENCOUNTER — Other Ambulatory Visit: Payer: Self-pay

## 2019-08-15 DIAGNOSIS — C3490 Malignant neoplasm of unspecified part of unspecified bronchus or lung: Secondary | ICD-10-CM

## 2019-08-15 DIAGNOSIS — Z510 Encounter for antineoplastic radiation therapy: Secondary | ICD-10-CM | POA: Diagnosis not present

## 2019-08-15 DIAGNOSIS — C8518 Unspecified B-cell lymphoma, lymph nodes of multiple sites: Secondary | ICD-10-CM

## 2019-08-15 NOTE — Progress Notes (Signed)
DISCONTINUE ON PATHWAY REGIMEN - Non-Small Cell Lung     A cycle is 21 days:     Pembrolizumab   **Always confirm dose/schedule in your pharmacy ordering system**  REASON: Disease Progression PRIOR TREATMENT: [id number]: Pembrolizumab 200 mg q21 Days Until Disease Progression, Unacceptable Toxicity, or up to 24 Months TREATMENT RESPONSE: Partial Response (PR)  START OFF PATHWAY REGIMEN - Non-Small Cell Lung   [id number]:Carboplatin AUC=6 D1 + Nab-paclitaxel 100 mg/m2 D1, 8, 15 + Pembrolizumab 200 mg D1 q21 Days x 4 Cycles:   A cycle is every 21 days:     Pembrolizumab      Nab-paclitaxel (protein bound)      Carboplatin   **Always confirm dose/schedule in your pharmacy ordering system**  Patient Characteristics: Stage IV Metastatic, Squamous, PS = 0, 1, First Line, PD-L1 Expression Positive ? 50% (TPS) and Immunotherapy Candidate AJCC T Category: T4 Current Disease Status: Distant Metastases AJCC N Category: N2 AJCC M Category: M1a AJCC 8 Stage Grouping: IVA Histology: Squamous Cell Line of therapy: First Line ECOG Performance Status: 1 PD-L1 Expression Status: PD-L1 Positive ? 50% (TPS) Immunotherapy Candidate Status: Candidate for Immunotherapy Intent of Therapy: Non-Curative / Palliative Intent, Discussed with Patient

## 2019-08-18 ENCOUNTER — Encounter: Payer: Self-pay | Admitting: Hematology and Oncology

## 2019-08-20 ENCOUNTER — Ambulatory Visit: Payer: Medicare HMO

## 2019-08-20 ENCOUNTER — Ambulatory Visit: Payer: Medicare HMO | Admitting: Hematology and Oncology

## 2019-08-20 ENCOUNTER — Other Ambulatory Visit: Payer: Medicare HMO

## 2019-08-21 ENCOUNTER — Other Ambulatory Visit: Payer: Self-pay | Admitting: Pulmonary Disease

## 2019-08-23 NOTE — Progress Notes (Signed)
[facility]  [address] [place], [place] [address] Phone: [phone]  Fax: [phone]   Clinic Day:  08/29/2019  Referring physician: Francesca Oman, DO  Chief Complaint: Toni Griffith is a 79 y.o. 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    HPI: The patient was last seen in the medical oncology clinic on 07/09/2019. At that time, she denies any respiratory symptoms. She denied any B symptoms. She had chronic knee issues.   She received 4000cGy to the left hilar region from 07/14/2019 - 08/15/2019.  Her radiation treatment went well.   She was seen by Dr Harrel Carina of rheumatology at [facility] on 08/13/2019.  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  She was scheduled for bilateral knee injections on 0127/2020 (not performed).  CBC followed 07/29/2019: Hematocrit 40.0, hemoglobin 13.1, MCV 99.3, platelets 316,000, WBC 11600.  08/12/2019: Hematocrit 40.3, hemoglobin 12.7, MCV 101.5, platelets 305,000, WBC 9100.   During the interim, she has been sweating more.  She notes feeling flush at times.  She denies any fever or chills.  Her house is at a good temp. She has been eating well and weight is up 5 lbs.  Today is a good day. Her knees still give her pain.  She would like to proceed with treatment today.  She states that everything is normal and she hasn't had any problems. Her daughter states that she has difficulty breathing when she moves around a lot. She is unsure if it id due to the mask she is wearing for COVIS-19 or related to her cancer.  She states that her breathing is fine without mask, and doesn't wear it around her boyfriend.  Daughter says her mother can bathe alone, but needs help getting and out of the shower.  She is dependent on others to help  get her dressed and cook her meals.    Past Medical History:  Diagnosis Date  . Arthritis   . Cancer [facility])    lymphoma-right hip  . Deaf   . GERD (gastroesophageal reflux disease)   . Hypertension    NOT ON MED AT PRESENT  . Squamous cell lung cancer [facility])     Past Surgical History:  Procedure Laterality Date  . CARDIAC SURGERY  1993  . CORONARY ARTERY BYPASS GRAFT     DOUBLE  . CYST REMOVAL HAND Right   . ENDOBRONCHIAL ULTRASOUND N/A 05/30/2018   Procedure: ENDOBRONCHIAL ULTRASOUND;  Surgeon: Tyler Pita, MD;  Location: [facility] ORS;  Service: Cardiopulmonary;  Laterality: N/A;  . PERIPHERAL VASCULAR CATHETERIZATION N/A 04/06/2016   Procedure: Glori Luis Cath Insertion;  Surgeon: Algernon Huxley, MD;  Location: [facility];  Service: Cardiovascular;  Laterality: N/A;    Family History  Problem Relation Age of Onset  . Lung cancer Mother   . Lung cancer Father   . Diabetes Maternal Uncle   . Myasthenia gravis Maternal Grandmother   . Leukemia Maternal Grandfather     Social History:  reports that she quit smoking about 3 years ago. Her smoking use included cigarettes. She has a 80.00 pack-year smoking history. She has never used smokeless tobacco. She reports current alcohol use. She reports that she does not use drugs. She smoked 1 1/2 packs per day for  20 years (30 pack year smoking history) until 10/2015. She is hearing impaired (deaf). She lives in [place] with her boyfriend. Her daughter lives in [place]. Her daughter, [name]'s contact number is [phone]. She has 2 cats named Pepper and Baby. The patient is accompanied by Karleen Dolphin ASL interpreter and daughter & son-in-law via video today.  Allergies: No Known Allergies  Current Medications: Current Outpatient Medications  Medication Sig Dispense Refill  . acetaminophen (TYLENOL) 500 MG tablet Take 500 mg by mouth every 6 (six) hours as needed for moderate pain.     Marland Kitchen allopurinol (ZYLOPRIM) 300 MG  tablet TAKE 1 TABLET BY MOUTH EVERY DAY 90 tablet 0  . aspirin EC 81 MG tablet Take 81 mg by mouth daily.    Marland Kitchen atorvastatin (LIPITOR) 80 MG tablet Take 80 mg by mouth every evening.     Marland Kitchen b complex vitamins tablet Take 1 tablet by mouth daily.    Carin Hock Iron (IRON CHEWS PEDIATRIC PO) Take 5 mg by mouth daily.    . Cholecalciferol (VITAMIN D3) 2000 units capsule Take 1,000 Units by mouth daily.     . DULoxetine (CYMBALTA) 30 MG capsule Take 1 capsule (71m) daily for 7 days then increase to 2 capsules (671m daily.    . Marland Kitchenidocaine-prilocaine (EMLA) cream Apply cream 1 hour before chemotherapy treatment and place small peive of saran wrap over cream to protect clothing 30 g 3  . Omega-3 Fatty Acids (FISH OIL PO) Take 1 tablet by mouth daily.    . ondansetron (ZOFRAN ODT) 4 MG disintegrating tablet Take 1 tablet (4 mg total) by mouth every 8 (eight) hours as needed for nausea or vomiting. 30 tablet 1  . pantoprazole (PROTONIX) 40 MG tablet Take 40 mg by mouth daily.     . vitamin E 400 UNIT capsule Take 400 Units by mouth daily.      No current facility-administered medications for this visit.   Facility-Administered Medications Ordered in Other Visits  Medication Dose Route Frequency Provider Last Rate Last Admin  . heparin lock flush 100 unit/mL  500 Units Intravenous Once Raimundo Corbit C, MD      . heparin lock flush 100 unit/mL  500 Units Intravenous Once Makynlie Rossini C, MD      . sodium chloride flush (NS) 0.9 % injection 10 mL  10 mL Intravenous PRN CoNolon Stalls, MD   10 mL at 04/05/17 1034  . sodium chloride flush (NS) 0.9 % injection 10 mL  10 mL Intravenous PRN CoLequita AsalMD   10 mL at 02/27/19 1033  . sodium chloride flush (NS) 0.9 % injection 10 mL  10 mL Intravenous PRN CoLequita AsalMD   10 mL at 08/29/19 0855    Review of Systems  Constitutional: Positive for diaphoresis (at times). Negative for chills, fever, malaise/fatigue and weight loss (up  5 lbs).       Feels "good".   HENT: Positive for hearing loss (deaf). Negative for congestion, ear pain, nosebleeds, sinus pain and sore throat.   Eyes: Negative.  Negative for blurred vision, double vision and photophobia.  Respiratory: Positive for shortness of breath (on exertion). Negative for cough, hemoptysis, sputum production and wheezing.   Cardiovascular: Negative.  Negative for chest pain, palpitations, orthopnea, leg swelling and PND.  Gastrointestinal: Negative.  Negative for abdominal pain, blood in stool, constipation, diarrhea, heartburn, melena, nausea and vomiting.  Genitourinary: Negative.  Negative for dysuria, flank pain, frequency, hematuria and  urgency.  Musculoskeletal: Positive for joint pain (knees- chronic). Negative for back pain, myalgias and neck pain.  Skin: Negative.  Negative for itching and rash.  Neurological: Positive for sensory change (heel feels numb- stable). Negative for tremors, speech change, focal weakness, seizures, weakness and headaches.  Endo/Heme/Allergies: Negative.  Does not bruise/bleed easily.  Psychiatric/Behavioral: Negative for depression, hallucinations and memory loss. The patient is not nervous/anxious and does not have insomnia.   All other systems reviewed and are negative.  Performance status (ECOG):  2  Vitals Blood pressure 123/61, pulse (!) 56, temperature (!) 97.5 F (36.4 C), temperature source Oral, resp. rate 16, weight 177 lb 9.6 oz (80.6 kg), SpO2 98 %.   Physical Exam  Constitutional: She is oriented to person, place, and time. Vital signs are normal. She appears well-developed and well-nourished. No distress.  Patient is sitting comfortably in a wheelchair.  HENT:  Head: Normocephalic and atraumatic.  Mouth/Throat: Oropharynx is clear and moist. No oropharyngeal exudate.  Curly short gray hair.  Mask.  Eyes: Conjunctivae and EOM are normal. No scleral icterus.  Hazel eyes.  Neck: No JVD present.  Cardiovascular:  Normal rate, regular rhythm and normal heart sounds. Exam reveals no gallop.  No murmur heard. Pulmonary/Chest: Effort normal and breath sounds normal. No respiratory distress. She has no wheezes. She has no rales.  Abdominal: Soft. Bowel sounds are normal. She exhibits no distension and no mass. There is no abdominal tenderness. There is no rebound and no guarding.  Musculoskeletal:        General: No edema. Normal range of motion.     Cervical back: Normal range of motion and neck supple.  Lymphadenopathy:       Head (right side): No preauricular, no posterior auricular and no occipital adenopathy present.       Head (left side): No preauricular, no posterior auricular and no occipital adenopathy present.    She has no cervical adenopathy.    She has no axillary adenopathy.       Right: No inguinal and no supraclavicular adenopathy present.       Left: No inguinal and no supraclavicular adenopathy present.  Neurological: She is alert and oriented to person, place, and time.  Skin: Skin is warm and dry. No rash noted. She is not diaphoretic. No pallor.  Psychiatric: She has a normal mood and affect. Her behavior is normal. Judgment and thought content normal.  Nursing note and vitals reviewed.   Infusion on 08/29/2019  Component Date Value Ref Range Status  . Sodium 08/29/2019 134* 135 - 145 mmol/L Final  . Potassium 08/29/2019 3.3* 3.5 - 5.1 mmol/L Final  . Chloride 08/29/2019 103  98 - 111 mmol/L Final  . CO2 08/29/2019 22  22 - 32 mmol/L Final  . Glucose, Bld 08/29/2019 169* 70 - 99 mg/dL Final  . BUN 08/29/2019 26* 8 - 23 mg/dL Final  . Creatinine, Ser 08/29/2019 1.12* 0.44 - 1.00 mg/dL Final  . Calcium 08/29/2019 8.8* 8.9 - 10.3 mg/dL Final  . Total Protein 08/29/2019 6.5  6.5 - 8.1 g/dL Final  . Albumin 08/29/2019 3.1* 3.5 - 5.0 g/dL Final  . AST 08/29/2019 26  15 - 41 U/L Final  . ALT 08/29/2019 22  0 - 44 U/L Final  . Alkaline Phosphatase 08/29/2019 70  38 - 126 U/L Final    . Total Bilirubin 08/29/2019 1.2  0.3 - 1.2 mg/dL Final  . GFR calc non Af Amer 08/29/2019 48* >60 mL/min Final  .  GFR calc Af Amer 08/29/2019 55* >60 mL/min Final  . Anion gap 08/29/2019 9  5 - 15 Final   Performed at [facility], [address], [place], [place] [address]  . WBC 08/29/2019 8.0  4.0 - 10.5 K/uL Final  . RBC 08/29/2019 4.29  3.87 - 5.11 MIL/uL Final  . Hemoglobin 08/29/2019 13.7  12.0 - 15.0 g/dL Final  . HCT 08/29/2019 41.5  36.0 - 46.0 % Final  . MCV 08/29/2019 96.7  80.0 - 100.0 fL Final  . MCH 08/29/2019 31.9  26.0 - 34.0 pg Final  . MCHC 08/29/2019 33.0  30.0 - 36.0 g/dL Final  . RDW 08/29/2019 15.8* 11.5 - 15.5 % Final  . Platelets 08/29/2019 278  150 - 400 K/uL Final  . nRBC 08/29/2019 0.0  0.0 - 0.2 % Final  . Neutrophils Relative % 08/29/2019 70  % Final  . Neutro Abs 08/29/2019 5.6  1.7 - 7.7 K/uL Final  . Lymphocytes Relative 08/29/2019 20  % Final  . Lymphs Abs 08/29/2019 1.6  0.7 - 4.0 K/uL Final  . Monocytes Relative 08/29/2019 6  % Final  . Monocytes Absolute 08/29/2019 0.5  0.1 - 1.0 K/uL Final  . Eosinophils Relative 08/29/2019 2  % Final  . Eosinophils Absolute 08/29/2019 0.2  0.0 - 0.5 K/uL Final  . Basophils Relative 08/29/2019 1  % Final  . Basophils Absolute 08/29/2019 0.1  0.0 - 0.1 K/uL Final  . Immature Granulocytes 08/29/2019 1  % Final  . Abs Immature Granulocytes 08/29/2019 0.11* 0.00 - 0.07 K/uL Final   Performed at [facility], [address], [place], [place] [address]  . Magnesium 08/29/2019 2.0  1.7 - 2.4 mg/dL Final   Performed at [facility], [address], [place], [place] [address]    Assessment:  Toni Griffith is a 79 y.o. female withstage IVB B-cell lymphoma, unclassifiable, with features intermediate between diffuse large B-cell lymphoma and classical Hodgkin's lymphoma ("gray zone" lymphoma). She underwent right iliac wing bone/bone marrow biopsy on 03/08/2016.  PET  scanon 03/03/2016 revealed a hypermetabolic left lower lobe mass, bilateral pleural and pulmonary parenchymal nodular metastasis, a large pleural lesion invading the anterior left chest wall. There was activity in the superior left ocular orbit. There was a large lesion centered in the right iliac wing with soft tissue extension. There were hypermetabolic right external iliac lymph nodes  Echoon 04/04/2016 revealed an EF of 55-60%. Hepatitis B and C testing was negative on 03/30/2016. G6PD assay was normal. She declined LP with MTX prophylaxis.  She received 6 cycles of mini-RCHOP(04/07/2016 - 07/21/2016). She has had some transient fingertip numbness.  She received radiationto the right iliac wing, from 10/11/2016 - 11/01/2016. She received 3000 cGy over 3 weeks.  PET scanon 01/29/2017 revealed interval progression of hypermetabolic nodules in the right upper (13 mm compared to 9 mm; SUV 10) and left lower lobes (26 mm compared to 22 mm; SUV 7.5). There was new hypermetabolic focus of activity along the anterior left pleura although no underlying pleural or lung mass was evident.  She has squamous cell lung cancer. Foundation Oneon the lung biopsy on 03/15/2018 revealed MS-stable, tumor mutational burden 5 Muts/Mb, AKT2 amplification, CCNE1 amplification, CREBBP R1446L, KDR amplification, KIT amplification, PDGFRA amplification, PTEN loss exons 2-4, and VH84 splice site [id number]>B. Marland Kitchen There were no alterations in EGFR, KRAS, RET, ALK, MET, ERBB2, BRAF, ROS1. PDL-1IHC analysis revealed a TPS score of 50%.  CT guided  left lower lobe nodule biopsyon 02/19/2017 confirmed squamous cell carcinoma. She underwent SBRT08/27/2018 - 04/09/2017. She received 5000 cGy in 5 fractions to the left lower lobe lesion. She underwent SBRTto a RUL lesion from 05/23/2017 - 06/18/2017.  CT guided left upper lobe nodule biopsyon 02/20/2018 revealedsquamous cell lung cancer. She received  SBRTfrom 04/09/2018 - 04/15/2018. She received 6000 cGy in 5 fractions.   Bronchoscopyon 05/30/2018 revealed no endobronchial lesions. The 2 cm pre-carinal lymph node was biopsied. Pathology revealed metastatic non-small cell carcinoma, favor squamous cell carcinoma.  CT guided right paraspinal lesion biopsyon 10/16/2017 was compatible with involvement by the patient's previously diagnosed lymphoma. The case was referred to [facility] hematopathology. The neoplastic cells were largely CD30 negative. Repeat CD30 immunohistochemistry faintly stained >10% of the large atypical cells. CD20 stained the scattered large atypical cells and few small cells.   She received 3 cycles of brentuximab vedotin(11/23/2017 - 01/11/2018).Her lymphoma progressed.  She is s/p14cycles of pembrolizumab(06/14/2018 -05/21/2019). She received a steroid Dosepak on 01/09/2019 for minor chest CT changes felt secondary to pembrolizumab.  Chest, abdomen, and pelvis CTon 06/06/2019 revealed an interval mass-like opacity in the superior aspect of the left parahilar lungwith an interval more prominent mass-like component involving the pleura laterally at the level with slightly more prominent extension into the left hila region, suspicious for recurrent or progressive lung cancer. There was mediastinal and bilateral hilar adenopathyc/wmetastatic adenopathy or involvement with the patient's B-cell lymphoma. There was no evidence of metastatic disease or lymphoma involving the abdomen or pelvis. There was significantly improved ground-glass opacities in the right lung, minimal left pleural fluid(improved), andastable large hiatal hernia.There was stable enlarged thyroid gland containing heterogeneous nodules with substernal extension on the left.There was stable to slightly decreased size of the previously demonstrated right adrenal nodule and no significant change in a small, poorly visualized left adrenal  nodule.  PET scanon 06/17/2019 revealed a known left parahilar lung mass markedly hypermetabolic with associated hypermetabolic lymphadenopathy in both hilar regions, the mediastinum, andthe right thoracic inlet. There was hypermetabolism in the right submandibular gland, indeterminate. There was no hypermetabolic metastatic disease evident in the abdomen or pelvis. There was a stable bilateral adrenal adenomas since 01/29/2018. There were tiny left pleural effusion and a large hiatal hernia.There was a segment of transverse colon extendingup into the hiatal hernia without obstructive features.   She received 4000cGy to the left hilar region from 07/14/2019 - 08/15/2019.  She hasiron deficiency anemia. Ferritinhas been followed: 14 on 05/06/2018,23 on 07/26/2018, and 59 on 11/01/2018. She denies any bleeding.  She has a grade I neuropathy. B12 and folate were normal on 12/06/2017. She has severe bilateral knee arthritis. She has received steroid injections and Monovisc (hyaluronate derivatives) without improvement.   She is deafand requires an interpreter.  Symptomatically, she is at her baseline.  She denies any B symptoms.  She has shortness of breath on exertion.  Exam reveals no adenopathy or hepatosplenomegaly.  Plan: 1.   Labs today: CBC with diff, CMP, Mg, TSH, T4. 2.   Gray zone lymphoma Patient is s/p 6 cycles of mini-RCOP Patient is s/p 3 cycles ofbrentuximab vedotin. Patient is s/p 14 cycles of pembrolizumab. Clinically,she denies any B symptoms. Exam reveals no adenopathy or hepatosplenomegaly. Chest, abdomen and pelvis CT on 06/06/2019 revealed mediastinal and bilateral hilar adenopathy. PET scan on 06/17/2019 revealed progressive disease in the chest only. Etiology felt secondary to progressive lung cancer. Patient declined biopsy. Patient had documented progressive lymphoma involving the right paraspinal region on  09/2017. Labs  reviewed.  Restart pembrolizumab maintenance to prevent emergence of active lymphoma Review plan for pembrolizumab every 3 weeks.             Patient consented to treatment. 3. Squamous cell lung cancer Patient is s/p SBRT to left upper lobe and left lower lobe nodules. Patient is s/p 14 cycle of pembrolizumab. Clinically,she has no increased shortness of breath. Chest, abdomen, and pelvis CT on 06/06/2019 revealed mass-like opacity in the superior aspect of the left parahilar lung, mediastinal and bilateral hilar adenopathy. PET scan on11/17/2020 revealedaleft parahilar lung mass(SUV 16.9) withhypermetabolicbilateral hilar adenopathy, the mediastinum,andthe right thoracic inlet.       Patient declined biopsy. Etiologyfelt secondary toprogressive lung cancer.             She has a tiny new left effusion (unclear significance- if positive M1a). Clinical stage T4N3Mx(stage IIIC). Patient is s/p palliative radiation to the right hilar region (completed 08/15/2019). Review plan for carboplatin and Abraxane.   Abraxane 100 mg/m2 IV on days 1,8, and 15.   Carboplatin AUC 5 on day 1.   Cycle length every 21 days.   Pembrolizumab incorporated for maintenance of gray zone lymphoma on day 1        Multiple questions asked and answered.   Treatment is palliative.  Patient consented to treatment.  Labs reviewed.  Begin day 1 of cycle #1 carboplatin and Abraxane. 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 Continue to monitor. 5. B12 deficiency Patient is on oral B12. B12 and folate were normal on 12/17/2018. Check annually. 6.Bilateral knee pain She saw Dr. Harrel Carina, rheumatologist on 08/13/2019.   Review consult. She has baseline arthritis which may be exacerbated by pembrolizumab. She was  scheduled for steroid injections (patient did not follow-up).  Continue to monitor. 7.   Hypokalemia  Patient denies any diarrhea.  Diet appears good.  Potassium 20 mq IV today.  Encourage potassium rich foods.  Discuss likely need for oral potassium supplementation given carboplatin.   Patient would like to postpone today. 8.   RTC in 1 week for MD assessment, labs (CBC with diff, CMP), and day 8 Abraxane.  I discussed the assessment and treatment plan with the patient.  The patient was provided an opportunity to ask questions and all were answered.  The patient agreed with the plan and demonstrated an understanding of the instructions.  The patient was advised to call back if the symptoms worsen or if the condition fails to improve as anticipated.   Lequita Asal, MD, PhD    08/29/2019, 10:26 AM  I, Samul Dada, am acting as a scribe for Lequita Asal, MD.  I, [place] Mike Gip, MD, have reviewed the above documentation for accuracy and completeness, and I agree with the above.

## 2019-08-27 ENCOUNTER — Other Ambulatory Visit: Payer: Medicare HMO

## 2019-08-27 ENCOUNTER — Ambulatory Visit: Payer: Medicare HMO | Admitting: Hematology and Oncology

## 2019-08-27 ENCOUNTER — Ambulatory Visit: Payer: Medicare HMO

## 2019-08-29 ENCOUNTER — Inpatient Hospital Stay: Payer: Medicare HMO

## 2019-08-29 ENCOUNTER — Inpatient Hospital Stay (HOSPITAL_BASED_OUTPATIENT_CLINIC_OR_DEPARTMENT_OTHER): Payer: Medicare HMO | Admitting: Hematology and Oncology

## 2019-08-29 ENCOUNTER — Other Ambulatory Visit: Payer: Self-pay

## 2019-08-29 ENCOUNTER — Other Ambulatory Visit: Payer: Self-pay | Admitting: Hematology and Oncology

## 2019-08-29 VITALS — BP 156/95 | HR 101 | Temp 98.0°F

## 2019-08-29 VITALS — BP 123/61 | HR 56 | Temp 97.5°F | Resp 16 | Wt 177.6 lb

## 2019-08-29 DIAGNOSIS — C3490 Malignant neoplasm of unspecified part of unspecified bronchus or lung: Secondary | ICD-10-CM

## 2019-08-29 DIAGNOSIS — Z5111 Encounter for antineoplastic chemotherapy: Secondary | ICD-10-CM

## 2019-08-29 DIAGNOSIS — C8518 Unspecified B-cell lymphoma, lymph nodes of multiple sites: Secondary | ICD-10-CM

## 2019-08-29 DIAGNOSIS — E538 Deficiency of other specified B group vitamins: Secondary | ICD-10-CM | POA: Diagnosis not present

## 2019-08-29 DIAGNOSIS — E876 Hypokalemia: Secondary | ICD-10-CM

## 2019-08-29 DIAGNOSIS — C8590 Non-Hodgkin lymphoma, unspecified, unspecified site: Secondary | ICD-10-CM

## 2019-08-29 DIAGNOSIS — G620 Drug-induced polyneuropathy: Secondary | ICD-10-CM | POA: Diagnosis not present

## 2019-08-29 DIAGNOSIS — Z7189 Other specified counseling: Secondary | ICD-10-CM

## 2019-08-29 DIAGNOSIS — T451X5A Adverse effect of antineoplastic and immunosuppressive drugs, initial encounter: Secondary | ICD-10-CM

## 2019-08-29 DIAGNOSIS — C7951 Secondary malignant neoplasm of bone: Secondary | ICD-10-CM

## 2019-08-29 DIAGNOSIS — Z5112 Encounter for antineoplastic immunotherapy: Secondary | ICD-10-CM | POA: Diagnosis not present

## 2019-08-29 DIAGNOSIS — M170 Bilateral primary osteoarthritis of knee: Secondary | ICD-10-CM

## 2019-08-29 LAB — CBC WITH DIFFERENTIAL/PLATELET
Abs Immature Granulocytes: 0.11 10*3/uL — ABNORMAL HIGH (ref 0.00–0.07)
Basophils Absolute: 0.1 10*3/uL (ref 0.0–0.1)
Basophils Relative: 1 %
Eosinophils Absolute: 0.2 10*3/uL (ref 0.0–0.5)
Eosinophils Relative: 2 %
HCT: 41.5 % (ref 36.0–46.0)
Hemoglobin: 13.7 g/dL (ref 12.0–15.0)
Immature Granulocytes: 1 %
Lymphocytes Relative: 20 %
Lymphs Abs: 1.6 10*3/uL (ref 0.7–4.0)
MCH: 31.9 pg (ref 26.0–34.0)
MCHC: 33 g/dL (ref 30.0–36.0)
MCV: 96.7 fL (ref 80.0–100.0)
Monocytes Absolute: 0.5 10*3/uL (ref 0.1–1.0)
Monocytes Relative: 6 %
Neutro Abs: 5.6 10*3/uL (ref 1.7–7.7)
Neutrophils Relative %: 70 %
Platelets: 278 10*3/uL (ref 150–400)
RBC: 4.29 MIL/uL (ref 3.87–5.11)
RDW: 15.8 % — ABNORMAL HIGH (ref 11.5–15.5)
WBC: 8 10*3/uL (ref 4.0–10.5)
nRBC: 0 % (ref 0.0–0.2)

## 2019-08-29 LAB — TSH: TSH: 1.413 u[IU]/mL (ref 0.350–4.500)

## 2019-08-29 LAB — COMPREHENSIVE METABOLIC PANEL
ALT: 22 U/L (ref 0–44)
AST: 26 U/L (ref 15–41)
Albumin: 3.1 g/dL — ABNORMAL LOW (ref 3.5–5.0)
Alkaline Phosphatase: 70 U/L (ref 38–126)
Anion gap: 9 (ref 5–15)
BUN: 26 mg/dL — ABNORMAL HIGH (ref 8–23)
CO2: 22 mmol/L (ref 22–32)
Calcium: 8.8 mg/dL — ABNORMAL LOW (ref 8.9–10.3)
Chloride: 103 mmol/L (ref 98–111)
Creatinine, Ser: 1.12 mg/dL — ABNORMAL HIGH (ref 0.44–1.00)
GFR calc Af Amer: 55 mL/min — ABNORMAL LOW (ref >60)
GFR calc non Af Amer: 48 mL/min — ABNORMAL LOW (ref >60)
Glucose, Bld: 169 mg/dL — ABNORMAL HIGH (ref 70–99)
Potassium: 3.3 mmol/L — ABNORMAL LOW (ref 3.5–5.1)
Sodium: 134 mmol/L — ABNORMAL LOW (ref 135–145)
Total Bilirubin: 1.2 mg/dL (ref 0.3–1.2)
Total Protein: 6.5 g/dL (ref 6.5–8.1)

## 2019-08-29 LAB — MAGNESIUM: Magnesium: 2 mg/dL (ref 1.7–2.4)

## 2019-08-29 MED ORDER — SODIUM CHLORIDE 0.9 % IV SOLN
Freq: Once | INTRAVENOUS | Status: AC
  Filled 2019-08-29: qty 250

## 2019-08-29 MED ORDER — SODIUM CHLORIDE 0.9 % IV SOLN: Freq: Once | INTRAVENOUS | Status: DC

## 2019-08-29 MED ORDER — SODIUM CHLORIDE 0.9 % IV SOLN
200.0000 mg | Freq: Once | INTRAVENOUS | Status: AC
  Administered 2019-08-29: 200 mg via INTRAVENOUS
  Filled 2019-08-29: qty 8

## 2019-08-29 MED ORDER — SODIUM CHLORIDE 0.9 % IV SOLN
150.0000 mg | Freq: Once | INTRAVENOUS | Status: AC
  Administered 2019-08-29: 150 mg via INTRAVENOUS
  Filled 2019-08-29: qty 5

## 2019-08-29 MED ORDER — PACLITAXEL PROTEIN-BOUND CHEMO INJECTION 100 MG
100.0000 mg/m2 | Freq: Once | INTRAVENOUS | Status: AC
  Administered 2019-08-29: 175 mg via INTRAVENOUS
  Filled 2019-08-29: qty 35

## 2019-08-29 MED ORDER — PALONOSETRON HCL INJECTION 0.25 MG/5ML
0.2500 mg | Freq: Once | INTRAVENOUS | Status: AC
  Administered 2019-08-29: 0.25 mg via INTRAVENOUS
  Filled 2019-08-29: qty 5

## 2019-08-29 MED ORDER — SODIUM CHLORIDE 0.9 % IV SOLN
388.5000 mg | Freq: Once | INTRAVENOUS | Status: AC
  Administered 2019-08-29: 390 mg via INTRAVENOUS
  Filled 2019-08-29: qty 39

## 2019-08-29 MED ORDER — SODIUM CHLORIDE 0.9 % IV SOLN
10.0000 mg | Freq: Once | INTRAVENOUS | Status: AC
  Administered 2019-08-29: 10 mg via INTRAVENOUS
  Filled 2019-08-29: qty 10

## 2019-08-29 MED ORDER — SODIUM CHLORIDE 0.9% FLUSH
10.0000 mL | INTRAVENOUS | Status: DC | PRN
  Administered 2019-08-29: 10 mL via INTRAVENOUS
  Filled 2019-08-29: qty 10

## 2019-08-29 MED ORDER — HEPARIN SOD (PORK) LOCK FLUSH 100 UNIT/ML IV SOLN
500.0000 [IU] | Freq: Once | INTRAVENOUS | Status: AC
  Administered 2019-08-29: 500 [IU] via INTRAVENOUS
  Filled 2019-08-29: qty 5

## 2019-08-29 MED ORDER — POTASSIUM CHLORIDE 20 MEQ/100ML IV SOLN
20.0000 meq | Freq: Once | INTRAVENOUS | Status: AC
  Administered 2019-08-29: 20 meq via INTRAVENOUS

## 2019-08-29 NOTE — Patient Instructions (Signed)
Nanoparticle Albumin-Bound Paclitaxel injection What is this medicine? NANOPARTICLE ALBUMIN-BOUND PACLITAXEL (Na no PAHR ti kuhl al BYOO muhn-bound PAK li TAX el) is a chemotherapy drug. It targets fast dividing cells, like cancer cells, and causes these cells to die. This medicine is used to treat advanced breast cancer, lung cancer, and pancreatic cancer. This medicine may be used for other purposes; ask your health care provider or pharmacist if you have questions. COMMON BRAND NAME(S): Abraxane What should I tell my health care provider before I take this medicine? They need to know if you have any of these conditions:  kidney disease  liver disease  low blood counts, like low white cell, platelet, or red cell counts  lung or breathing disease, like asthma  tingling of the fingers or toes, or other nerve disorder  an unusual or allergic reaction to paclitaxel, albumin, other chemotherapy, other medicines, foods, dyes, or preservatives  pregnant or trying to get pregnant  breast-feeding How should I use this medicine? This drug is given as an infusion into a vein. It is administered in a hospital or clinic by a specially trained health care professional. Talk to your pediatrician regarding the use of this medicine in children. Special care may be needed. Overdosage: If you think you have taken too much of this medicine contact a poison control center or emergency room at once. NOTE: This medicine is only for you. Do not share this medicine with others. What if I miss a dose? It is important not to miss your dose. Call your doctor or health care professional if you are unable to keep an appointment. What may interact with this medicine? This medicine may interact with the following medications:  antiviral medicines for hepatitis, HIV or AIDS  certain antibiotics like erythromycin and clarithromycin  certain medicines for fungal infections like ketoconazole and  itraconazole  certain medicines for seizures like carbamazepine, phenobarbital, phenytoin  gemfibrozil  nefazodone  rifampin  St. John's wort This list may not describe all possible interactions. Give your health care provider a list of all the medicines, herbs, non-prescription drugs, or dietary supplements you use. Also tell them if you smoke, drink alcohol, or use illegal drugs. Some items may interact with your medicine. What should I watch for while using this medicine? Your condition will be monitored carefully while you are receiving this medicine. You will need important blood work done while you are taking this medicine. This medicine can cause serious allergic reactions. If you experience allergic reactions like skin rash, itching or hives, swelling of the face, lips, or tongue, tell your doctor or health care professional right away. In some cases, you may be given additional medicines to help with side effects. Follow all directions for their use. This drug may make you feel generally unwell. This is not uncommon, as chemotherapy can affect healthy cells as well as cancer cells. Report any side effects. Continue your course of treatment even though you feel ill unless your doctor tells you to stop. Call your doctor or health care professional for advice if you get a fever, chills or sore throat, or other symptoms of a cold or flu. Do not treat yourself. This drug decreases your body's ability to fight infections. Try to avoid being around people who are sick. This medicine may increase your risk to bruise or bleed. Call your doctor or health care professional if you notice any unusual bleeding. Be careful brushing and flossing your teeth or using a toothpick because you may  get an infection or bleed more easily. If you have any dental work done, tell your dentist you are receiving this medicine. Avoid taking products that contain aspirin, acetaminophen, ibuprofen, naproxen, or  ketoprofen unless instructed by your doctor. These medicines may hide a fever. Do not become pregnant while taking this medicine or for 6 months after stopping it. Women should inform their doctor if they wish to become pregnant or think they might be pregnant. Men should not father a child while taking this medicine or for 3 months after stopping it. There is a potential for serious side effects to an unborn child. Talk to your health care professional or pharmacist for more information. Do not breast-feed an infant while taking this medicine or for 2 weeks after stopping it. This medicine may interfere with the ability to get pregnant or to father a child. You should talk to your doctor or health care professional if you are concerned about your fertility. What side effects may I notice from receiving this medicine? Side effects that you should report to your doctor or health care professional as soon as possible:  allergic reactions like skin rash, itching or hives, swelling of the face, lips, or tongue  breathing problems  changes in vision  fast, irregular heartbeat  low blood pressure  mouth sores  pain, tingling, numbness in the hands or feet  signs of decreased platelets or bleeding - bruising, pinpoint red spots on the skin, black, tarry stools, blood in the urine  signs of decreased red blood cells - unusually weak or tired, feeling faint or lightheaded, falls  signs of infection - fever or chills, cough, sore throat, pain or difficulty passing urine  signs and symptoms of liver injury like dark yellow or brown urine; general ill feeling or flu-like symptoms; light-colored stools; loss of appetite; nausea; right upper belly pain; unusually weak or tired; yellowing of the eyes or skin  swelling of the ankles, feet, hands  unusually slow heartbeat Side effects that usually do not require medical attention (report to your doctor or health care professional if they continue or  are bothersome):  diarrhea  hair loss  loss of appetite  nausea, vomiting  tiredness This list may not describe all possible side effects. Call your doctor for medical advice about side effects. You may report side effects to FDA at 1-800-FDA-1088. Where should I keep my medicine? This drug is given in a hospital or clinic and will not be stored at home. NOTE: This sheet is a summary. It may not cover all possible information. If you have questions about this medicine, talk to your doctor, pharmacist, or health care provider.  2020 Elsevier/Gold Standard (2017-03-20 13:03:45) Carboplatin injection What is this medicine? CARBOPLATIN (KAR boe pla tin) is a chemotherapy drug. It targets fast dividing cells, like cancer cells, and causes these cells to die. This medicine is used to treat ovarian cancer and many other cancers. This medicine may be used for other purposes; ask your health care provider or pharmacist if you have questions. COMMON BRAND NAME(S): Paraplatin What should I tell my health care provider before I take this medicine? They need to know if you have any of these conditions:  blood disorders  hearing problems  kidney disease  recent or ongoing radiation therapy  an unusual or allergic reaction to carboplatin, cisplatin, other chemotherapy, other medicines, foods, dyes, or preservatives  pregnant or trying to get pregnant  breast-feeding How should I use this medicine? This drug is  usually given as an infusion into a vein. It is administered in a hospital or clinic by a specially trained health care professional. Talk to your pediatrician regarding the use of this medicine in children. Special care may be needed. Overdosage: If you think you have taken too much of this medicine contact a poison control center or emergency room at once. NOTE: This medicine is only for you. Do not share this medicine with others. What if I miss a dose? It is important not to  miss a dose. Call your doctor or health care professional if you are unable to keep an appointment. What may interact with this medicine?  medicines for seizures  medicines to increase blood counts like filgrastim, pegfilgrastim, sargramostim  some antibiotics like amikacin, gentamicin, neomycin, streptomycin, tobramycin  vaccines Talk to your doctor or health care professional before taking any of these medicines:  acetaminophen  aspirin  ibuprofen  ketoprofen  naproxen This list may not describe all possible interactions. Give your health care provider a list of all the medicines, herbs, non-prescription drugs, or dietary supplements you use. Also tell them if you smoke, drink alcohol, or use illegal drugs. Some items may interact with your medicine. What should I watch for while using this medicine? Your condition will be monitored carefully while you are receiving this medicine. You will need important blood work done while you are taking this medicine. This drug may make you feel generally unwell. This is not uncommon, as chemotherapy can affect healthy cells as well as cancer cells. Report any side effects. Continue your course of treatment even though you feel ill unless your doctor tells you to stop. In some cases, you may be given additional medicines to help with side effects. Follow all directions for their use. Call your doctor or health care professional for advice if you get a fever, chills or sore throat, or other symptoms of a cold or flu. Do not treat yourself. This drug decreases your body's ability to fight infections. Try to avoid being around people who are sick. This medicine may increase your risk to bruise or bleed. Call your doctor or health care professional if you notice any unusual bleeding. Be careful brushing and flossing your teeth or using a toothpick because you may get an infection or bleed more easily. If you have any dental work done, tell your dentist  you are receiving this medicine. Avoid taking products that contain aspirin, acetaminophen, ibuprofen, naproxen, or ketoprofen unless instructed by your doctor. These medicines may hide a fever. Do not become pregnant while taking this medicine. Women should inform their doctor if they wish to become pregnant or think they might be pregnant. There is a potential for serious side effects to an unborn child. Talk to your health care professional or pharmacist for more information. Do not breast-feed an infant while taking this medicine. What side effects may I notice from receiving this medicine? Side effects that you should report to your doctor or health care professional as soon as possible:  allergic reactions like skin rash, itching or hives, swelling of the face, lips, or tongue  signs of infection - fever or chills, cough, sore throat, pain or difficulty passing urine  signs of decreased platelets or bleeding - bruising, pinpoint red spots on the skin, black, tarry stools, nosebleeds  signs of decreased red blood cells - unusually weak or tired, fainting spells, lightheadedness  breathing problems  changes in hearing  changes in vision  chest pain  high blood pressure  low blood counts - This drug may decrease the number of white blood cells, red blood cells and platelets. You may be at increased risk for infections and bleeding.  nausea and vomiting  pain, swelling, redness or irritation at the injection site  pain, tingling, numbness in the hands or feet  problems with balance, talking, walking  trouble passing urine or change in the amount of urine Side effects that usually do not require medical attention (report to your doctor or health care professional if they continue or are bothersome):  hair loss  loss of appetite  metallic taste in the mouth or changes in taste This list may not describe all possible side effects. Call your doctor for medical advice about  side effects. You may report side effects to FDA at 1-800-FDA-1088. Where should I keep my medicine? This drug is given in a hospital or clinic and will not be stored at home. NOTE: This sheet is a summary. It may not cover all possible information. If you have questions about this medicine, talk to your doctor, pharmacist, or health care provider.  2020 [employer]/Gold Standard (2007-10-22 14:38:05) Pembrolizumab injection What is this medicine? PEMBROLIZUMAB (pem broe liz ue mab) is a monoclonal antibody. It is used to treat certain types of cancer. This medicine may be used for other purposes; ask your health care provider or pharmacist if you have questions. COMMON BRAND NAME(S): Keytruda What should I tell my health care provider before I take this medicine? They need to know if you have any of these conditions:  diabetes  immune system problems  inflammatory bowel disease  liver disease  lung or breathing disease  lupus  received or scheduled to receive an organ transplant or a stem-cell transplant that uses donor stem cells  an unusual or allergic reaction to pembrolizumab, other medicines, foods, dyes, or preservatives  pregnant or trying to get pregnant  breast-feeding How should I use this medicine? This medicine is for infusion into a vein. It is given by a health care professional in a hospital or clinic setting. A special MedGuide will be given to you before each treatment. Be sure to read this information carefully each time. Talk to your pediatrician regarding the use of this medicine in children. While this drug may be prescribed for children as young as 6 months for selected conditions, precautions do apply. Overdosage: If you think you have taken too much of this medicine contact a poison control center or emergency room at once. NOTE: This medicine is only for you. Do not share this medicine with others. What if I miss a dose? It is important not to miss your  dose. Call your doctor or health care professional if you are unable to keep an appointment. What may interact with this medicine? Interactions have not been studied. Give your health care provider a list of all the medicines, herbs, non-prescription drugs, or dietary supplements you use. Also tell them if you smoke, drink alcohol, or use illegal drugs. Some items may interact with your medicine. This list may not describe all possible interactions. Give your health care provider a list of all the medicines, herbs, non-prescription drugs, or dietary supplements you use. Also tell them if you smoke, drink alcohol, or use illegal drugs. Some items may interact with your medicine. What should I watch for while using this medicine? Your condition will be monitored carefully while you are receiving this medicine. You may need blood work done while you  are taking this medicine. Do not become pregnant while taking this medicine or for 4 months after stopping it. Women should inform their doctor if they wish to become pregnant or think they might be pregnant. There is a potential for serious side effects to an unborn child. Talk to your health care professional or pharmacist for more information. Do not breast-feed an infant while taking this medicine or for 4 months after the last dose. What side effects may I notice from receiving this medicine? Side effects that you should report to your doctor or health care professional as soon as possible:  allergic reactions like skin rash, itching or hives, swelling of the face, lips, or tongue  bloody or black, tarry  breathing problems  changes in vision  chest pain  chills  confusion  constipation  cough  diarrhea  dizziness or feeling faint or lightheaded  fast or irregular heartbeat  fever  flushing  joint pain  low blood counts - this medicine may decrease the number of white blood cells, red blood cells and platelets. You may be at  increased risk for infections and bleeding.  muscle pain  muscle weakness  pain, tingling, numbness in the hands or feet  persistent headache  redness, blistering, peeling or loosening of the skin, including inside the mouth  signs and symptoms of high blood sugar such as dizziness; dry mouth; dry skin; fruity breath; nausea; stomach pain; increased hunger or thirst; increased urination  signs and symptoms of kidney injury like trouble passing urine or change in the amount of urine  signs and symptoms of liver injury like dark urine, light-colored stools, loss of appetite, nausea, right upper belly pain, yellowing of the eyes or skin  sweating  swollen lymph nodes  weight loss Side effects that usually do not require medical attention (report to your doctor or health care professional if they continue or are bothersome):  decreased appetite  hair loss  muscle pain  tiredness This list may not describe all possible side effects. Call your doctor for medical advice about side effects. You may report side effects to FDA at 1-800-FDA-1088. Where should I keep my medicine? This drug is given in a hospital or clinic and will not be stored at home. NOTE: This sheet is a summary. It may not cover all possible information. If you have questions about this medicine, talk to your doctor, pharmacist, or health care provider.  2020 [employer]vier/Gold Standard (2019-05-23 18:07:58)

## 2019-08-29 NOTE — Progress Notes (Signed)
Patient here for follow up. Reports increase in hot flashes and diaphoresis starting approx. 2-3 weeks ago. Denies any other symptoms.

## 2019-08-30 LAB — T4: T4, Total: 6.3 ug/dL (ref 4.5–12.0)

## 2019-09-02 ENCOUNTER — Telehealth: Payer: Self-pay | Admitting: *Deleted

## 2019-09-02 NOTE — Telephone Encounter (Addendum)
Daughter called reporting that patient is having ringing in her ears causing some mild pain and is asking what cn be done for it. Please advise

## 2019-09-03 ENCOUNTER — Telehealth: Payer: Self-pay

## 2019-09-03 ENCOUNTER — Other Ambulatory Visit: Payer: Self-pay

## 2019-09-03 NOTE — Telephone Encounter (Signed)
Contacted daughter, Toni Griffith, and informed her [name] has talked with Dr. Kathyrn Sheriff, ENT and it has been decided that this patient will need to be seen in ENT office. Informed her Levada Dy will be in contact with her to schedule appt. For this week. Daughter verbalizes understanding and denies any further questions or concerns.

## 2019-09-03 NOTE — Telephone Encounter (Addendum)
  Please call patient.  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  It may be due to the carboplatin (much more common with cisplatin).  Andria Rhein, pharmacist, provided me with papers regarding possible intratympanic Decadron.  Let's schedule ENT evaluation.   Lequita Asal, MD

## 2019-09-03 NOTE — Progress Notes (Signed)
[facility]  [address] [place], [place] [address] Phone: [phone]  Fax: [phone]   Clinic day:  09/05/2019  Referring physician: Francesca Oman, DO  I connected with Toni Griffith on 09/05/19 at 9:49 AM by videoconferencing and verified that I was speaking with the correct person using 2 identifiers.  The patient was at her son's home.  I discussed the limitations, risk, security and privacy concerns of performing an evaluation and management service by videoconferencing and the availability of in person appointments.  I also discussed with the patient that there may be a patient responsible charge related to this service.  The patient expressed understanding and agreed to proceed.  During her video conference, decision was made to change her appointment to an in person appointment and continue chemotherapy today.    Chief Complaint: Toni Griffith is a 79 y.o. 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  HPI: The patient was last seen in the medical oncology clinic on 08/29/2019. At that time, she was at her baseline. She denied any B symptoms.  She had shortness of breath on exertion.  Exam revealed no adenopathy or hepatosplenomegaly. Potassium was 3.3.  She received 20 meq IV potassium.  She declined oral potassium.  She received day 1 of carboplatin, Abraxane and pembrolizumab.   She contacted the clinic on 09/01/2018 secondary to bilateral tinnitus causing mild pain.  Etiology was felt secondary to carboplatin.  She was referred to ENT.  During the interim, ringing in her ear started on Monday night and had ringing sensation.  She was seen by Dr Toni Griffith from ENT on 09/04/2019. She was noted to have sudden onset of tinnitus that subsided in 2 days.  No appreciable hearing could be demonstrated, but she still had  cochlear hair cells that were affected by the chemotherapy to cause tinnitus.  It was felt possible that the few cells may be damaged enough that she would not have any further tinnitus or could show up again with further treatment.  Even if she would have further tinnitus with chemotherapy, it would not worsen her hearing anymore and the ringing would fade if it came back.  They wished to continue chemotherapy.    Symptomatically, she feels "alright". Tinnitus has faded.  She would like to continue with chemotherapy. She denies any increase in neuropathy in her fingers and toes from baseline. She had a single day with a headache and sore throat s/p chemotherapy. She vomited but daughter believes it was due to food. She took her nausea medication; she had no recurrent episodes.  She believes she consumed her food to quickly.   She has eaten some potassium rich foods during the interim but is trying to do better.    Past Medical History:  Diagnosis Date   Arthritis    Cancer ([place])    lymphoma-right hip   Deaf    GERD (gastroesophageal reflux disease)    Hypertension    NOT ON MED AT PRESENT   Squamous cell lung cancer ([place])     Past Surgical History:  Procedure Laterality Date   CARDIAC SURGERY  1993   CORONARY ARTERY BYPASS GRAFT     DOUBLE   CYST REMOVAL HAND Right    ENDOBRONCHIAL ULTRASOUND N/A 05/30/2018   Procedure: ENDOBRONCHIAL ULTRASOUND;  Surgeon: Toni Pita, MD;  Location: [facility] ORS;  Service: Cardiopulmonary;  Laterality: N/A;   PERIPHERAL VASCULAR CATHETERIZATION N/A 04/06/2016   Procedure: Toni Griffith Cath Insertion;  Surgeon: Toni Huxley, MD;  Location: [facility];  Service: Cardiovascular;  Laterality: N/A;    Family History  Problem Relation Age of Onset   Lung cancer Mother    Lung cancer Father    Diabetes Maternal Uncle    Myasthenia gravis Maternal Grandmother    Leukemia Maternal Grandfather     Social History:  reports that she  quit smoking about 3 years ago. Her smoking use included cigarettes. She has a 80.00 pack-year smoking history. She has never used smokeless tobacco. She reports current alcohol use. She reports that she does not use drugs. She smoked 1 1/2 packs per day for 20 years (30 pack year smoking history) until 10/2015. She is hearing impaired (deaf). She lives in [place] with her boyfriend. Her daughter lives in [place]. Her daughter, Toni Griffith's contact number is [phone]. She has 2 cats named Pepper and Baby.  The patient is accompanied by her daughter and her son via [employer] at home today.  Her daughter is interpreting for her mother.  She is later examined in clinic.  Participants in the patient's visit and their role in the encounter included the patient, her son and daughter, Toni Griffith, scribe and Toni Budge, RN today.  The intake visit was provided by Toni Griffith, scribe and Toni Budge, RN.   Allergies: No Known Allergies  Current Medications: Current Outpatient Medications  Medication Sig Dispense Refill   acetaminophen (TYLENOL) 500 MG tablet Take 500 mg by mouth every 6 (six) hours as needed for moderate pain.      allopurinol (ZYLOPRIM) 300 MG tablet TAKE 1 TABLET BY MOUTH EVERY DAY 90 tablet 0   aspirin EC 81 MG tablet Take 81 mg by mouth daily.     atorvastatin (LIPITOR) 80 MG tablet Take 80 mg by mouth every evening.      b complex vitamins tablet Take 1 tablet by mouth daily.     Carbonyl Iron (IRON CHEWS PEDIATRIC PO) Take 5 mg by mouth daily.     Cholecalciferol (VITAMIN D3) 2000 units capsule Take 1,000 Units by mouth daily.      DULoxetine (CYMBALTA) 30 MG capsule Take 1 capsule (43m) daily for 7 days then increase to 2 capsules (624m daily.     Omega-3 Fatty Acids (FISH OIL PO) Take 1 tablet by mouth daily.     pantoprazole (PROTONIX) 40 MG tablet Take 40 mg by mouth daily.      vitamin E 400 UNIT capsule Take 400 Units by mouth daily.       lidocaine-prilocaine (EMLA) cream Apply cream 1 hour before chemotherapy treatment and place small peive of saran wrap over cream to protect clothing (Patient not taking: Reported on 09/04/2019) 30 g 3   ondansetron (ZOFRAN ODT) 4 MG disintegrating tablet Take 1 tablet (4 mg total) by mouth every 8 (eight) hours as needed for nausea or vomiting. (Patient not taking: Reported on 09/04/2019) 30 tablet 1   No current facility-administered medications for this visit.   Facility-Administered Medications Ordered in Other Visits  Medication Dose Route Frequency Provider Last Rate Last Admin   heparin lock flush 100 unit/mL  500 Units Intravenous Once Alesana Magistro C, MD       sodium chloride flush (NS) 0.9 % injection 10 mL  10 mL Intravenous PRN CoLequita AsalMD   10 mL at 04/05/17  1034   sodium chloride flush (NS) 0.9 % injection 10 mL  10 mL Intravenous PRN Lequita Asal, MD   10 mL at 02/27/19 1033    Review of Systems  Constitutional: Negative for chills, diaphoresis, fever, malaise/fatigue and weight loss (no new weight).       Feels "good".   HENT: Positive for hearing loss (deaf) and tinnitus (fading). Negative for congestion, ear discharge, ear pain (resolved), nosebleeds, sinus pain and sore throat.   Eyes: Negative.  Negative for blurred vision, double vision and photophobia.  Respiratory: Positive for shortness of breath (on exertion). Negative for cough, hemoptysis, sputum production and wheezing.   Cardiovascular: Negative.  Negative for chest pain, palpitations, orthopnea, leg swelling and PND.  Gastrointestinal: Positive for vomiting (x 1 s/p chemotherapy). Negative for abdominal pain, blood in stool, constipation, diarrhea, heartburn, melena and nausea.  Genitourinary: Negative.  Negative for dysuria, flank pain, frequency, hematuria and urgency.  Musculoskeletal: Positive for joint pain (knees- chronic). Negative for back pain, myalgias and neck pain.  Skin:  Negative.  Negative for itching and rash.  Neurological: Positive for sensory change (baseline neuropathy unchanged) and headaches (x 1 s/p chemotherapy). Negative for tremors, speech change, focal weakness, seizures and weakness.  Endo/Heme/Allergies: Negative.  Does not bruise/bleed easily.  Psychiatric/Behavioral: Negative.  Negative for depression, hallucinations and memory loss. The patient is not nervous/anxious and does not have insomnia.   All other systems reviewed and are negative.  Performance status (ECOG):  1-2  Vitals Blood pressure 131/84, pulse 101, temperature (!) 96.5 F (35.8 C), temperature source Tympanic, resp. rate 16.  Physical Exam  Constitutional: She is oriented to person, place, and time. Vital signs are normal. She appears well-developed and well-nourished. No distress.  Patient is sitting comfortably in the infusion chair in no acute distress.  HENT:  Head: Normocephalic and atraumatic.  Curly short gray hair.  Mask.  Eyes: Pupils are equal, round, and reactive to light. Conjunctivae and EOM are normal. No scleral icterus.  Hazel eyes.  Neck: No JVD present.  Cardiovascular: Normal rate and normal heart sounds. Exam reveals no gallop.  No murmur heard. Pulmonary/Chest: Effort normal and breath sounds normal. No respiratory distress. She has no wheezes. She has no rales.  Abdominal: Soft. Bowel sounds are normal. She exhibits no distension. There is no abdominal tenderness. There is no rebound and no guarding.  Musculoskeletal:        General: No tenderness or edema. Normal range of motion.     Cervical back: Normal range of motion and neck supple.  Lymphadenopathy:       Head (right side): No preauricular, no posterior auricular and no occipital adenopathy present.       Head (left side): No preauricular, no posterior auricular and no occipital adenopathy present.    She has no cervical adenopathy.       Right: No supraclavicular adenopathy present.        Left: No supraclavicular adenopathy present.  Neurological: She is alert and oriented to person, place, and time.  Skin: No rash noted. She is not diaphoretic. No erythema. No pallor.  Psychiatric: She has a normal mood and affect. Her behavior is normal. Judgment and thought content normal.  Nursing note and vitals reviewed.   No visits with results within 3 Day(s) from this visit.  Latest known visit with results is:  Infusion on 08/29/2019  Component Date Value Ref Range Status   TSH 08/29/2019 1.413  0.350 - 4.500 uIU/mL  Final   Comment: Performed by a 3rd Generation assay with a functional sensitivity of <=0.01 uIU/mL. Performed at [facility], [address] [place], [place] [address]    T4, Total 08/29/2019 6.3  4.5 - 12.0 ug/dL Final   Comment: (NOTE) Performed At: [facility] [place], [place] [id number] Rush Farmer MD NO:[id number]    Sodium 08/29/2019 134* 135 - 145 mmol/L Final   Potassium 08/29/2019 3.3* 3.5 - 5.1 mmol/L Final   Chloride 08/29/2019 103  98 - 111 mmol/L Final   CO2 08/29/2019 22  22 - 32 mmol/L Final   Glucose, Bld 08/29/2019 169* 70 - 99 mg/dL Final   BUN 08/29/2019 26* 8 - 23 mg/dL Final   Creatinine, Ser 08/29/2019 1.12* 0.44 - 1.00 mg/dL Final   Calcium 08/29/2019 8.8* 8.9 - 10.3 mg/dL Final   Total Protein 08/29/2019 6.5  6.5 - 8.1 g/dL Final   Albumin 08/29/2019 3.1* 3.5 - 5.0 g/dL Final   AST 08/29/2019 26  15 - 41 U/L Final   ALT 08/29/2019 22  0 - 44 U/L Final   Alkaline Phosphatase 08/29/2019 70  38 - 126 U/L Final   Total Bilirubin 08/29/2019 1.2  0.3 - 1.2 mg/dL Final   GFR calc non Af Amer 08/29/2019 48* >60 mL/min Final   GFR calc Af Amer 08/29/2019 55* >60 mL/min Final   Anion gap 08/29/2019 9  5 - 15 Final   Performed at [facility], [address] [place], [place] [address]   WBC 08/29/2019 8.0  4.0 - 10.5 K/uL Final   RBC 08/29/2019 4.29  3.87 - 5.11 MIL/uL  Final   Hemoglobin 08/29/2019 13.7  12.0 - 15.0 g/dL Final   HCT 08/29/2019 41.5  36.0 - 46.0 % Final   MCV 08/29/2019 96.7  80.0 - 100.0 fL Final   MCH 08/29/2019 31.9  26.0 - 34.0 pg Final   MCHC 08/29/2019 33.0  30.0 - 36.0 g/dL Final   RDW 08/29/2019 15.8* 11.5 - 15.5 % Final   Platelets 08/29/2019 278  150 - 400 K/uL Final   nRBC 08/29/2019 0.0  0.0 - 0.2 % Final   Neutrophils Relative % 08/29/2019 70  % Final   Neutro Abs 08/29/2019 5.6  1.7 - 7.7 K/uL Final   Lymphocytes Relative 08/29/2019 20  % Final   Lymphs Abs 08/29/2019 1.6  0.7 - 4.0 K/uL Final   Monocytes Relative 08/29/2019 6  % Final   Monocytes Absolute 08/29/2019 0.5  0.1 - 1.0 K/uL Final   Eosinophils Relative 08/29/2019 2  % Final   Eosinophils Absolute 08/29/2019 0.2  0.0 - 0.5 K/uL Final   Basophils Relative 08/29/2019 1  % Final   Basophils Absolute 08/29/2019 0.1  0.0 - 0.1 K/uL Final   Immature Granulocytes 08/29/2019 1  % Final   Abs Immature Granulocytes 08/29/2019 0.11* 0.00 - 0.07 K/uL Final   Performed at [facility], [address] [place], [place] [address]   Magnesium 08/29/2019 2.0  1.7 - 2.4 mg/dL Final   Performed at [facility], [address], [place], [place] [address]    Assessment:  Toni Griffith is a 79 y.o. female withstage IVB B-cell lymphoma, unclassifiable, with features intermediate between diffuse large B-cell lymphoma and classical Hodgkin's lymphoma ("gray zone" lymphoma). She underwent right iliac wing bone/bone marrow biopsy on 03/08/2016.  PET scanon 03/03/2016 revealed a hypermetabolic left lower lobe mass, bilateral pleural and pulmonary parenchymal  nodular metastasis, a large pleural lesion invading the anterior left chest wall. There was activity in the superior left ocular orbit. There was a large lesion centered in the right iliac wing with soft tissue extension. There were hypermetabolic right external iliac lymph  nodes  [place] 04/04/2016 revealed an EF of 55-60%. Hepatitis B and C testing was negative on 03/30/2016. G6PD assay was normal. She declined LP with MTX prophylaxis.  She received 6 cycles of mini-RCHOP(04/07/2016 - 07/21/2016). She has had some transient fingertip numbness.  She received radiationto the right iliac wing, from 10/11/2016 - 11/01/2016. She received 3000 cGy over 3 weeks.  PET scanon 01/29/2017 revealed interval progression of hypermetabolic nodules in the right upper (13 mm compared to 9 mm; SUV 10) and left lower lobes (26 mm compared to 22 mm; SUV 7.5). There was new hypermetabolic focus of activity along the anterior left pleura although no underlying pleural or lung mass was evident.  She has squamous cell lung cancer. Foundation Oneon the lung biopsy on 03/15/2018 revealed MS-stable, tumor mutational burden 5 Muts/Mb, AKT2 amplification, CCNE1 amplification, CREBBP R1446L, KDR amplification, KIT amplification, PDGFRA amplification, PTEN loss exons 2-4, and OZ30 splice site [id number]. Marland Kitchen There were no alterations in EGFR, KRAS, RET, ALK, MET, ERBB2, BRAF, ROS1. PDL-1IHC analysis revealed a TPS score of 50%.  CT guided left lower lobe nodule biopsyon 02/19/2017 confirmed squamous cell carcinoma. She underwent SBRT08/27/2018 - 04/09/2017. She received 5000 cGy in 5 fractions to the left lower lobe lesion. She underwent SBRTto a RUL lesion from 05/23/2017 - 06/18/2017.  CT guided left upper lobe nodule biopsyon 02/20/2018 revealedsquamous cell lung cancer. She received SBRTfrom 04/09/2018 - 04/15/2018. She received 6000 cGy in 5 fractions.   Bronchoscopyon 05/30/2018 revealed no endobronchial lesions. The 2 cm pre-carinal lymph node was biopsied. Pathology revealed metastatic non-small cell carcinoma, favor squamous cell carcinoma.  CT guided right paraspinal lesion biopsyon 10/16/2017 was compatible with involvement by the patient's  previously diagnosed lymphoma. The case was referred to [facility] hematopathology. The neoplastic cells were largely CD30 negative. Repeat CD30 immunohistochemistry faintly stained >10% of the large atypical cells. CD20 stained the scattered large atypical cells and few small cells.   She received 3 cycles of brentuximab vedotin(11/23/2017 - 01/11/2018).Her lymphoma progressed.  She is s/p14cycles of pembrolizumab(06/14/2018 -05/21/2019). She received a steroid Dosepak on 01/09/2019 for minor chest CT changes felt secondary to pembrolizumab.  She is day 8 s/p cycle #1 pembolizumab, carboplatin and Abraxane (08/29/2019).  She experienced tinnitus after her first day of chemotherapy.  Chest, abdomen, and pelvis CTon 06/06/2019 revealed an interval mass-like opacity in the superior aspect of the left parahilar lungwith an interval more prominent mass-like component involving the pleura laterally at the level with slightly more prominent extension into the left hila region, suspicious for recurrent or progressive lung cancer. There was mediastinal and bilateral hilar adenopathyc/wmetastatic adenopathy or involvement with the patient's B-cell lymphoma. There was no evidence of metastatic disease or lymphoma involving the abdomen or pelvis. There was significantly improved ground-glass opacities in the right lung, minimal left pleural fluid(improved), andastable large hiatal hernia.There was stable enlarged thyroid gland containing heterogeneous nodules with substernal extension on the left.There was stable to slightly decreased size of the previously demonstrated right adrenal nodule and no significant change in a small, poorly visualized left adrenal nodule.  PET scanon 06/17/2019 revealed a known left parahilar lung mass markedly hypermetabolic with associated hypermetabolic lymphadenopathy in both hilar regions, the mediastinum, andthe right thoracic inlet. There was hypermetabolism  in  the right submandibular gland, indeterminate. There was no hypermetabolic metastatic disease evident in the abdomen or pelvis. There was a stable bilateral adrenal adenomas since 01/29/2018. There were tiny left pleural effusion and a large hiatal hernia.There was a segment of transverse colon extendingup into the hiatal hernia without obstructive features.   She received 4000cGy to the left hilar region from 07/14/2019 - 08/15/2019.  She hasiron deficiency anemia. Ferritinhas been followed: 14 on 05/06/2018,23 on 07/26/2018, and 59 on 11/01/2018. She denies any bleeding.  She has a grade I neuropathy. B12 and folate were normal on 12/06/2017. She has severe bilateral knee arthritis. She has received steroid injections and Monovisc (hyaluronate derivatives) without improvement.   She is deafand requires an interpreter.  Symptomatically, her tinnitus is fading.  Exam is stable.  Plan: 1.   Labs today:  CBC with diff, CMP.  2.Gray zone lymphoma Clinically,she is doing well without B symptoms. Examreveals no adenopathy or hepatosplenomegaly. Patient is s/p 6 cycles of mini-RCOP Patient is s/p 3 cycles ofbrentuximab vedotin. Patient is s/p 16 cycles of pembrolizumab. Patient had documented progressive lymphoma involving the right paraspinal region on 09/2017. Chest, abdomen and pelvis CT on 06/06/2019 revealed mediastinal and bilateral hilar adenopathy. PET scan on 06/17/2019 revealed progressive disease in the chest felt secondary toprogressive lung cancer. Patient continues maintenance pembrolizumab to prevent emergence of active lymphoma (last 08/29/2019). Continue pembrolizumab every 3 weeks. 3. Squamous cell lung cancer Clinically, she is doing well without increased shortness of breath or pain.  Patient is s/p SBRT to left upper lobe and left lower lobe nodules. Patient is s/p 15 cycles of pembrolizumab. Chest, abdomen, and pelvis CT on  06/06/2019 revealed mass-like opacity in the superior aspect of the left parahilar lung,mediastinal and bilateral hilar adenopathy. PET scan on11/17/2020 revealedaleft parahilar lung mass(SUV 16.9) withhypermetabolicbilateral hilar adenopathy, the mediastinum,andthe right thoracic inlet. Patient declined biopsy. Etiologyfelt secondary toprogressive lung cancer. She has a tiny new left effusion (unclear significance- if positive M1a). Clinical stage T4N3Mx(stage IIIC). Patient is s/p palliative radiation to the right hilar region (completed 08/15/2019). She is s/p cycle #1 carboplatin and Abraxane (08/29/2019).                   Abraxane 100 mg/m2 IV on days 1,8, and 15.                   Carboplatin AUC 5 on day 1.                   Cycle length every 21 days.                   Pembrolizumab incorporated for maintenance of gray zone lymphoma on day 1  She experienced significant tinnitus with cycle #1.  Labs reviewed.  Day 8 Abraxane today.  Discuss plans for labs only next week (cycle #1 will be 2 weeks on/1 week off).   Patient's daughter would like chemotherapy switched from Fridays to Mondays. 4.Tinnitus, new  Patient experienced significant tinnitus on day 4 s/p carboplatin.  Tinnitus has faded significantly.  I personally spoke with ENT.  Consult note from ENT on 09/05/2019 reviewed.   Patient would like to continue chemotherapy. 5.   Chemotherapy-induced neuropathy Patient has a stable grade I neuropathyin her heels. Monitor closely on Abraxane. 6. B12 deficiency Patient remains on oral B12. B12 and folate were normal on 12/17/2018. Check annually. 7.Bilateral knee pain She saw Dr. Harrel Carina, rheumatologist on 08/13/2019.  She has baseline arthritis which may be exacerbated by pembrolizumab. She declined steroid injections.              Patient with pain in clinic today.   Patient received Tylenol 650 mg po x 1.  Continue to monitor while on pembrolizumab. 8.   Hypokalemia             Potassium 3.9 today.  No supplement needed.  Monitor closely on carboplatin. 9.   RTC on 09/12/2019 for labs (CBC with diff, BMP). 10.   RTC on 09/24/2019 for MD assesswent, labs (CBC with diff, CMP, Mg, TSH, LDH, uric acid), and cycle #2 pembrolizumab, carboplatin and Abraxane.  I discussed the assessment and treatment plan with the patient.  The patient was provided an opportunity to ask questions and all were answered.  The patient agreed with the plan and demonstrated an understanding of the instructions.  The patient was advised to call back if the symptoms worsen or if the condition fails to improve as anticipated.  I provided 16 minutes (9:50 AM - 10:06 AM) of video face-to-face time + 8 minutes in person during this encounter and > 50% was spent counseling as documented under my assessment and plan. In addition, I spoke with ENT regarding patient's tinnitus.   Lequita Asal, MD, PhD    09/05/2019, 10:06 AM  I, Toni Griffith, am acting as a scribe for Lequita Asal, MD.  I, Hiltonia Mike Gip, MD, have reviewed the above documentation for accuracy and completeness, and I agree with the above.

## 2019-09-04 ENCOUNTER — Encounter: Payer: Self-pay | Admitting: Hematology and Oncology

## 2019-09-04 NOTE — Progress Notes (Signed)
The has had some ringing in her bilateral ears, but her daughter states she is better today than she was yesterday. The patient Name, DOB and medications has been verified by her daughter over the phone.

## 2019-09-05 ENCOUNTER — Telehealth: Payer: Self-pay

## 2019-09-05 ENCOUNTER — Inpatient Hospital Stay: Payer: Medicare HMO

## 2019-09-05 ENCOUNTER — Inpatient Hospital Stay: Payer: Medicare HMO | Attending: Hematology and Oncology | Admitting: Hematology and Oncology

## 2019-09-05 ENCOUNTER — Other Ambulatory Visit: Payer: Self-pay

## 2019-09-05 ENCOUNTER — Encounter: Payer: Self-pay | Admitting: Hematology and Oncology

## 2019-09-05 ENCOUNTER — Ambulatory Visit: Payer: Medicare HMO

## 2019-09-05 VITALS — BP 131/84 | HR 101 | Temp 96.5°F | Resp 16

## 2019-09-05 DIAGNOSIS — Z806 Family history of leukemia: Secondary | ICD-10-CM | POA: Insufficient documentation

## 2019-09-05 DIAGNOSIS — M25562 Pain in left knee: Secondary | ICD-10-CM

## 2019-09-05 DIAGNOSIS — Z87891 Personal history of nicotine dependence: Secondary | ICD-10-CM | POA: Insufficient documentation

## 2019-09-05 DIAGNOSIS — E538 Deficiency of other specified B group vitamins: Secondary | ICD-10-CM | POA: Insufficient documentation

## 2019-09-05 DIAGNOSIS — C8518 Unspecified B-cell lymphoma, lymph nodes of multiple sites: Secondary | ICD-10-CM

## 2019-09-05 DIAGNOSIS — H9319 Tinnitus, unspecified ear: Secondary | ICD-10-CM | POA: Diagnosis not present

## 2019-09-05 DIAGNOSIS — H9190 Unspecified hearing loss, unspecified ear: Secondary | ICD-10-CM | POA: Diagnosis not present

## 2019-09-05 DIAGNOSIS — L658 Other specified nonscarring hair loss: Secondary | ICD-10-CM | POA: Diagnosis not present

## 2019-09-05 DIAGNOSIS — D3501 Benign neoplasm of right adrenal gland: Secondary | ICD-10-CM | POA: Insufficient documentation

## 2019-09-05 DIAGNOSIS — J90 Pleural effusion, not elsewhere classified: Secondary | ICD-10-CM | POA: Diagnosis not present

## 2019-09-05 DIAGNOSIS — Z833 Family history of diabetes mellitus: Secondary | ICD-10-CM | POA: Insufficient documentation

## 2019-09-05 DIAGNOSIS — E876 Hypokalemia: Secondary | ICD-10-CM

## 2019-09-05 DIAGNOSIS — R519 Headache, unspecified: Secondary | ICD-10-CM | POA: Insufficient documentation

## 2019-09-05 DIAGNOSIS — C3490 Malignant neoplasm of unspecified part of unspecified bronchus or lung: Secondary | ICD-10-CM

## 2019-09-05 DIAGNOSIS — K449 Diaphragmatic hernia without obstruction or gangrene: Secondary | ICD-10-CM | POA: Diagnosis not present

## 2019-09-05 DIAGNOSIS — M170 Bilateral primary osteoarthritis of knee: Secondary | ICD-10-CM | POA: Diagnosis not present

## 2019-09-05 DIAGNOSIS — C3412 Malignant neoplasm of upper lobe, left bronchus or lung: Secondary | ICD-10-CM | POA: Diagnosis not present

## 2019-09-05 DIAGNOSIS — Z803 Family history of malignant neoplasm of breast: Secondary | ICD-10-CM | POA: Insufficient documentation

## 2019-09-05 DIAGNOSIS — C8330 Diffuse large B-cell lymphoma, unspecified site: Secondary | ICD-10-CM | POA: Insufficient documentation

## 2019-09-05 DIAGNOSIS — H9313 Tinnitus, bilateral: Secondary | ICD-10-CM | POA: Diagnosis not present

## 2019-09-05 DIAGNOSIS — J029 Acute pharyngitis, unspecified: Secondary | ICD-10-CM | POA: Insufficient documentation

## 2019-09-05 DIAGNOSIS — R112 Nausea with vomiting, unspecified: Secondary | ICD-10-CM | POA: Diagnosis not present

## 2019-09-05 DIAGNOSIS — C3432 Malignant neoplasm of lower lobe, left bronchus or lung: Secondary | ICD-10-CM | POA: Insufficient documentation

## 2019-09-05 DIAGNOSIS — Z79899 Other long term (current) drug therapy: Secondary | ICD-10-CM | POA: Insufficient documentation

## 2019-09-05 DIAGNOSIS — D75A Glucose-6-phosphate dehydrogenase (G6PD) deficiency without anemia: Secondary | ICD-10-CM | POA: Diagnosis not present

## 2019-09-05 DIAGNOSIS — G620 Drug-induced polyneuropathy: Secondary | ICD-10-CM | POA: Diagnosis not present

## 2019-09-05 DIAGNOSIS — M25561 Pain in right knee: Secondary | ICD-10-CM | POA: Insufficient documentation

## 2019-09-05 DIAGNOSIS — Z820 Family history of epilepsy and other diseases of the nervous system: Secondary | ICD-10-CM | POA: Insufficient documentation

## 2019-09-05 DIAGNOSIS — Z5111 Encounter for antineoplastic chemotherapy: Secondary | ICD-10-CM | POA: Diagnosis not present

## 2019-09-05 DIAGNOSIS — T451X5A Adverse effect of antineoplastic and immunosuppressive drugs, initial encounter: Secondary | ICD-10-CM

## 2019-09-05 DIAGNOSIS — D3502 Benign neoplasm of left adrenal gland: Secondary | ICD-10-CM | POA: Diagnosis not present

## 2019-09-05 DIAGNOSIS — D509 Iron deficiency anemia, unspecified: Secondary | ICD-10-CM | POA: Diagnosis not present

## 2019-09-05 DIAGNOSIS — E049 Nontoxic goiter, unspecified: Secondary | ICD-10-CM | POA: Insufficient documentation

## 2019-09-05 DIAGNOSIS — G8929 Other chronic pain: Secondary | ICD-10-CM

## 2019-09-05 LAB — COMPREHENSIVE METABOLIC PANEL
ALT: 18 U/L (ref 0–44)
AST: 22 U/L (ref 15–41)
Albumin: 2.7 g/dL — ABNORMAL LOW (ref 3.5–5.0)
Alkaline Phosphatase: 76 U/L (ref 38–126)
Anion gap: 10 (ref 5–15)
BUN: 16 mg/dL (ref 8–23)
CO2: 20 mmol/L — ABNORMAL LOW (ref 22–32)
Calcium: 8.4 mg/dL — ABNORMAL LOW (ref 8.9–10.3)
Chloride: 105 mmol/L (ref 98–111)
Creatinine, Ser: 0.9 mg/dL (ref 0.44–1.00)
GFR calc Af Amer: >60 mL/min (ref >60)
GFR calc non Af Amer: >60 mL/min (ref >60)
Glucose, Bld: 109 mg/dL — ABNORMAL HIGH (ref 70–99)
Potassium: 3.9 mmol/L (ref 3.5–5.1)
Sodium: 135 mmol/L (ref 135–145)
Total Bilirubin: 0.6 mg/dL (ref 0.3–1.2)
Total Protein: 6.2 g/dL — ABNORMAL LOW (ref 6.5–8.1)

## 2019-09-05 LAB — CBC WITH DIFFERENTIAL/PLATELET
Abs Immature Granulocytes: 0.05 10*3/uL (ref 0.00–0.07)
Basophils Absolute: 0 10*3/uL (ref 0.0–0.1)
Basophils Relative: 1 %
Eosinophils Absolute: 0.2 10*3/uL (ref 0.0–0.5)
Eosinophils Relative: 4 %
HCT: 34.7 % — ABNORMAL LOW (ref 36.0–46.0)
Hemoglobin: 11.3 g/dL — ABNORMAL LOW (ref 12.0–15.0)
Immature Granulocytes: 1 %
Lymphocytes Relative: 24 %
Lymphs Abs: 1.2 10*3/uL (ref 0.7–4.0)
MCH: 31.8 pg (ref 26.0–34.0)
MCHC: 32.6 g/dL (ref 30.0–36.0)
MCV: 97.7 fL (ref 80.0–100.0)
Monocytes Absolute: 0.2 10*3/uL (ref 0.1–1.0)
Monocytes Relative: 5 %
Neutro Abs: 3.3 10*3/uL (ref 1.7–7.7)
Neutrophils Relative %: 65 %
Platelets: 262 10*3/uL (ref 150–400)
RBC: 3.55 MIL/uL — ABNORMAL LOW (ref 3.87–5.11)
RDW: 15 % (ref 11.5–15.5)
WBC: 5 10*3/uL (ref 4.0–10.5)
nRBC: 0 % (ref 0.0–0.2)

## 2019-09-05 MED ORDER — HEPARIN SOD (PORK) LOCK FLUSH 100 UNIT/ML IV SOLN
500.0000 [IU] | Freq: Once | INTRAVENOUS | Status: AC
  Administered 2019-09-05: 500 [IU] via INTRAVENOUS
  Filled 2019-09-05: qty 5

## 2019-09-05 MED ORDER — ACETAMINOPHEN 325 MG PO TABS
ORAL_TABLET | ORAL | Status: AC
  Filled 2019-09-05: qty 2

## 2019-09-05 MED ORDER — PACLITAXEL PROTEIN-BOUND CHEMO INJECTION 100 MG
100.0000 mg/m2 | Freq: Once | INTRAVENOUS | Status: AC
  Administered 2019-09-05: 175 mg via INTRAVENOUS
  Filled 2019-09-05: qty 35

## 2019-09-05 MED ORDER — ONDANSETRON HCL 4 MG PO TABS
8.0000 mg | ORAL_TABLET | Freq: Once | ORAL | Status: AC
  Administered 2019-09-05: 8 mg via ORAL

## 2019-09-05 MED ORDER — ONDANSETRON HCL 4 MG PO TABS
ORAL_TABLET | ORAL | Status: AC
  Filled 2019-09-05: qty 2

## 2019-09-05 MED ORDER — ACETAMINOPHEN 325 MG PO TABS
650.0000 mg | ORAL_TABLET | Freq: Four times a day (QID) | ORAL | Status: DC | PRN
  Administered 2019-09-05: 650 mg via ORAL

## 2019-09-05 MED ORDER — SODIUM CHLORIDE 0.9 % IV SOLN
Freq: Once | INTRAVENOUS | Status: AC
  Filled 2019-09-05: qty 250

## 2019-09-05 MED ORDER — SODIUM CHLORIDE 0.9% FLUSH
10.0000 mL | INTRAVENOUS | Status: DC | PRN
  Administered 2019-09-05: 10 mL via INTRAVENOUS
  Filled 2019-09-05: qty 10

## 2019-09-05 NOTE — Telephone Encounter (Signed)
The patient has completed the referral with the ENT and notes have been faxed to the office. MD aware.

## 2019-09-05 NOTE — Patient Instructions (Signed)
Nanoparticle Albumin-Bound Paclitaxel injection What is this medicine? NANOPARTICLE ALBUMIN-BOUND PACLITAXEL (Na no PAHR ti kuhl al BYOO muhn-bound PAK li TAX el) is a chemotherapy drug. It targets fast dividing cells, like cancer cells, and causes these cells to die. This medicine is used to treat advanced breast cancer, lung cancer, and pancreatic cancer. This medicine may be used for other purposes; ask your health care provider or pharmacist if you have questions. COMMON BRAND NAME(S): Abraxane What should I tell my health care provider before I take this medicine? They need to know if you have any of these conditions:  kidney disease  liver disease  low blood counts, like low white cell, platelet, or red cell counts  lung or breathing disease, like asthma  tingling of the fingers or toes, or other nerve disorder  an unusual or allergic reaction to paclitaxel, albumin, other chemotherapy, other medicines, foods, dyes, or preservatives  pregnant or trying to get pregnant  breast-feeding How should I use this medicine? This drug is given as an infusion into a vein. It is administered in a hospital or clinic by a specially trained health care professional. Talk to your pediatrician regarding the use of this medicine in children. Special care may be needed. Overdosage: If you think you have taken too much of this medicine contact a poison control center or emergency room at once. NOTE: This medicine is only for you. Do not share this medicine with others. What if I miss a dose? It is important not to miss your dose. Call your doctor or health care professional if you are unable to keep an appointment. What may interact with this medicine? This medicine may interact with the following medications:  antiviral medicines for hepatitis, HIV or AIDS  certain antibiotics like erythromycin and clarithromycin  certain medicines for fungal infections like ketoconazole and  itraconazole  certain medicines for seizures like carbamazepine, phenobarbital, phenytoin  gemfibrozil  nefazodone  rifampin  St. John's wort This list may not describe all possible interactions. Give your health care provider a list of all the medicines, herbs, non-prescription drugs, or dietary supplements you use. Also tell them if you smoke, drink alcohol, or use illegal drugs. Some items may interact with your medicine. What should I watch for while using this medicine? Your condition will be monitored carefully while you are receiving this medicine. You will need important blood work done while you are taking this medicine. This medicine can cause serious allergic reactions. If you experience allergic reactions like skin rash, itching or hives, swelling of the face, lips, or tongue, tell your doctor or health care professional right away. In some cases, you may be given additional medicines to help with side effects. Follow all directions for their use. This drug may make you feel generally unwell. This is not uncommon, as chemotherapy can affect healthy cells as well as cancer cells. Report any side effects. Continue your course of treatment even though you feel ill unless your doctor tells you to stop. Call your doctor or health care professional for advice if you get a fever, chills or sore throat, or other symptoms of a cold or flu. Do not treat yourself. This drug decreases your body's ability to fight infections. Try to avoid being around people who are sick. This medicine may increase your risk to bruise or bleed. Call your doctor or health care professional if you notice any unusual bleeding. Be careful brushing and flossing your teeth or using a toothpick because you may  get an infection or bleed more easily. If you have any dental work done, tell your dentist you are receiving this medicine. Avoid taking products that contain aspirin, acetaminophen, ibuprofen, naproxen, or  ketoprofen unless instructed by your doctor. These medicines may hide a fever. Do not become pregnant while taking this medicine or for 6 months after stopping it. Women should inform their doctor if they wish to become pregnant or think they might be pregnant. Men should not father a child while taking this medicine or for 3 months after stopping it. There is a potential for serious side effects to an unborn child. Talk to your health care professional or pharmacist for more information. Do not breast-feed an infant while taking this medicine or for 2 weeks after stopping it. This medicine may interfere with the ability to get pregnant or to father a child. You should talk to your doctor or health care professional if you are concerned about your fertility. What side effects may I notice from receiving this medicine? Side effects that you should report to your doctor or health care professional as soon as possible:  allergic reactions like skin rash, itching or hives, swelling of the face, lips, or tongue  breathing problems  changes in vision  fast, irregular heartbeat  low blood pressure  mouth sores  pain, tingling, numbness in the hands or feet  signs of decreased platelets or bleeding - bruising, pinpoint red spots on the skin, black, tarry stools, blood in the urine  signs of decreased red blood cells - unusually weak or tired, feeling faint or lightheaded, falls  signs of infection - fever or chills, cough, sore throat, pain or difficulty passing urine  signs and symptoms of liver injury like dark yellow or brown urine; general ill feeling or flu-like symptoms; light-colored stools; loss of appetite; nausea; right upper belly pain; unusually weak or tired; yellowing of the eyes or skin  swelling of the ankles, feet, hands  unusually slow heartbeat Side effects that usually do not require medical attention (report to your doctor or health care professional if they continue or  are bothersome):  diarrhea  hair loss  loss of appetite  nausea, vomiting  tiredness This list may not describe all possible side effects. Call your doctor for medical advice about side effects. You may report side effects to FDA at 1-800-FDA-1088. Where should I keep my medicine? This drug is given in a hospital or clinic and will not be stored at home. NOTE: This sheet is a summary. It may not cover all possible information. If you have questions about this medicine, talk to your doctor, pharmacist, or health care provider.  2020 [employer]vier/Gold Standard (2017-03-20 13:03:45)

## 2019-09-12 ENCOUNTER — Telehealth: Payer: Self-pay | Admitting: *Deleted

## 2019-09-12 ENCOUNTER — Other Ambulatory Visit: Payer: Self-pay

## 2019-09-12 ENCOUNTER — Inpatient Hospital Stay: Payer: Medicare HMO

## 2019-09-12 ENCOUNTER — Telehealth: Payer: Self-pay

## 2019-09-12 DIAGNOSIS — C3490 Malignant neoplasm of unspecified part of unspecified bronchus or lung: Secondary | ICD-10-CM

## 2019-09-12 DIAGNOSIS — C8518 Unspecified B-cell lymphoma, lymph nodes of multiple sites: Secondary | ICD-10-CM

## 2019-09-12 DIAGNOSIS — Z5111 Encounter for antineoplastic chemotherapy: Secondary | ICD-10-CM | POA: Diagnosis not present

## 2019-09-12 LAB — CBC WITH DIFFERENTIAL/PLATELET
Abs Immature Granulocytes: 0.05 10*3/uL (ref 0.00–0.07)
Basophils Absolute: 0 10*3/uL (ref 0.0–0.1)
Basophils Relative: 1 %
Eosinophils Absolute: 0.2 10*3/uL (ref 0.0–0.5)
Eosinophils Relative: 6 %
HCT: 34.9 % — ABNORMAL LOW (ref 36.0–46.0)
Hemoglobin: 11.5 g/dL — ABNORMAL LOW (ref 12.0–15.0)
Immature Granulocytes: 2 %
Lymphocytes Relative: 33 %
Lymphs Abs: 0.9 10*3/uL (ref 0.7–4.0)
MCH: 32.2 pg (ref 26.0–34.0)
MCHC: 33 g/dL (ref 30.0–36.0)
MCV: 97.8 fL (ref 80.0–100.0)
Monocytes Absolute: 0.3 10*3/uL (ref 0.1–1.0)
Monocytes Relative: 12 %
Neutro Abs: 1.3 10*3/uL — ABNORMAL LOW (ref 1.7–7.7)
Neutrophils Relative %: 46 %
Platelets: 260 10*3/uL (ref 150–400)
RBC: 3.57 MIL/uL — ABNORMAL LOW (ref 3.87–5.11)
RDW: 15.5 % (ref 11.5–15.5)
WBC: 2.8 10*3/uL — ABNORMAL LOW (ref 4.0–10.5)
nRBC: 0 % (ref 0.0–0.2)

## 2019-09-12 LAB — COMPREHENSIVE METABOLIC PANEL
ALT: 16 U/L (ref 0–44)
AST: 23 U/L (ref 15–41)
Albumin: 2.9 g/dL — ABNORMAL LOW (ref 3.5–5.0)
Alkaline Phosphatase: 72 U/L (ref 38–126)
Anion gap: 7 (ref 5–15)
BUN: 16 mg/dL (ref 8–23)
CO2: 24 mmol/L (ref 22–32)
Calcium: 8.6 mg/dL — ABNORMAL LOW (ref 8.9–10.3)
Chloride: 105 mmol/L (ref 98–111)
Creatinine, Ser: 0.82 mg/dL (ref 0.44–1.00)
GFR calc Af Amer: >60 mL/min (ref >60)
GFR calc non Af Amer: >60 mL/min (ref >60)
Glucose, Bld: 120 mg/dL — ABNORMAL HIGH (ref 70–99)
Potassium: 3.9 mmol/L (ref 3.5–5.1)
Sodium: 136 mmol/L (ref 135–145)
Total Bilirubin: 0.5 mg/dL (ref 0.3–1.2)
Total Protein: 6.5 g/dL (ref 6.5–8.1)

## 2019-09-12 LAB — URIC ACID: Uric Acid, Serum: 3.7 mg/dL (ref 2.5–7.1)

## 2019-09-12 LAB — MAGNESIUM: Magnesium: 1.8 mg/dL (ref 1.7–2.4)

## 2019-09-12 LAB — LACTATE DEHYDROGENASE: LDH: 150 U/L (ref 98–192)

## 2019-09-12 MED ORDER — HEPARIN SOD (PORK) LOCK FLUSH 100 UNIT/ML IV SOLN
500.0000 [IU] | Freq: Once | INTRAVENOUS | Status: AC
  Administered 2019-09-12: 11:00:00 500 [IU] via INTRAVENOUS
  Filled 2019-09-12: qty 5

## 2019-09-12 MED ORDER — SODIUM CHLORIDE 0.9% FLUSH
10.0000 mL | INTRAVENOUS | Status: DC | PRN
  Administered 2019-09-12: 10 mL via INTRAVENOUS
  Filled 2019-09-12: qty 10

## 2019-09-12 NOTE — Telephone Encounter (Signed)
Spoke with ms Toni Griffith to inform her that per Dr Mike Gip  ANC 1300, WBC 2800 /  neutropenic precautions, continue to keep hands washed, santized if out and about please wear face mask. Ms Toni Griffith was understanding and agreeable.

## 2019-09-12 NOTE — Telephone Encounter (Signed)
Toni Griffith called and states that she saw results on MyChart and she would like a call to discuss results  Magnesium Order: [id number]  Status: Final result Visible to patient: Yes (MyChart) Next appt: 09/24/2019 at 09:15 AM in Oncology ([place] 7) Dx: Squamous cell carcinoma of lung, unsp...    Ref Range & Units 11:14 2 wk ago 2 mo ago  Magnesium 1.7 - 2.4 mg/dL 1.8  2.0 CM  2.0 CM   Comment: Performed at [facility], [address] [place], [place] [address]  Resulting Agency  [facility] CLIN LAB [facility] CLIN LAB [facility] CL[facility] LAB     Specimen Collected: 09/12/19 11:14 Last Resulted: 09/12/19 11:48  Lab Flowsheet  Order Details  View Encounter  Lab and Collection Details  Routing  Result History   CM=Additional comments    Other Results from 09/12/2019 TSH  Status: In process Visible to patient: No (not released) Next appt: 09/24/2019 at 09:15 AM in Oncology ([place] 7) Dx: Squamous cell carcinoma of lung, unsp...  Order: [id number]    Specimen Collected: 09/12/19 11:14 Last Resulted: 09/12/19 11:30  Order Details  View Encounter  Lab and Collection Details  Routing  Result History        T4  Status: In process Visible to patient: No (not released) Next appt: 09/24/2019 at 09:15 AM in Oncology ([place] 7) Dx: Squamous cell carcinoma of lung, unsp...  Order: [id number]    Specimen Collected: 09/12/19 11:14 Last Resulted: 09/12/19 11:30  Order Details  View Encounter  Lab and Collection Details  Routing  Result History        Comprehensive metabolic panel  Status: Final result Visible to patient: Yes (MyChart) Next appt: 09/24/2019 at 09:15 AM in Oncology ([place] 7) Dx: Squamous cell carcinoma of lung, unsp...  Order: [id number]    Ref Range & Units 11:14 7 d ago 2 wk ago  Sodium 135 - 145 mmol/L 136  135  134  Potassium 3.5 - 5.1 mmol/L 3.9  3.9  3.3  Chloride 98 - 111 mmol/L 105  105  103   CO2 22 - 32  mmol/L 24  20 22    Glucose, Bld 70 - 99 mg/dL 120 109 169  BUN 8 - 23 mg/dL 16  16  26   Creatinine, Ser 0.44 - 1.00 mg/dL 0.82  0.90  1.12  Calcium 8.9 - 10.3 mg/dL 8.6 8.4 8.8  Total Protein 6.5 - 8.1 g/dL 6.5  6.2 6.5   Albumin 3.5 - 5.0 g/dL 2.9 2.7 3.1  AST 15 - 41 U/L 23  22  26    ALT 0 - 44 U/L 16  18  22    Alkaline Phosphatase 38 - 126 U/L 72  76  70   Total Bilirubin 0.3 - 1.2 mg/dL 0.5  0.6  1.2   GFR calc non Af Amer >60 mL/min >60  60 mL/min" class="[id number]">60  60 mL/min" class="[id number]"37  GFR calc Af Amer >60 mL/min >60  60 mL/min" class="[id number]">60  60 mL/min" class="[id number]"35  Anion gap 5 - 15 7  10  CM  9 CM   Comment: Performed at [facility], [address], [place], [place] [address]  Resulting Agency  [facility] CLIN LAB [facility] CLIN LAB [facility] CLIN LAB     Specimen Collected: 09/12/19 11:14 Last Resulted: 09/12/19 11:48  Lab Flowsheet  Order Details  View Encounter  Lab and Collection Details  Routing  Result History  CM=Additional comments       CBC with Differential  Status: Final result Visible to patient: Yes (MyChart) Next appt: 09/24/2019 at 09:15 AM in Oncology ([place] 7) Dx: Squamous cell carcinoma of lung, unsp...  Order: [id number]    Ref Range & Units 11:14 7 d ago 2 wk ago  WBC 4.0 - 10.5 K/uL 2.8 5.0  8.0   RBC 3.87 - 5.11 MIL/uL 3.57 3.55 4.29   Hemoglobin 12.0 - 15.0 g/dL 11.5 11.3 13.7   HCT 36.0 - 46.0 % 34.9 34.7 41.5   MCV 80.0 - 100.0 fL 97.8  97.7  96.7   MCH 26.0 - 34.0 pg 32.2  31.8  31.9   MCHC 30.0 - 36.0 g/dL 33.0  32.6  33.0   RDW 11.5 - 15.5 % 15.5  15.0  15.8  Platelets 150 - 400 K/uL 260  262  278   nRBC 0.0 - 0.2 % 0.0  0.0  0.0   Neutrophils Relative % % 46  65  70   Neutro Abs 1.7 - 7.7 K/uL 1.3 3.3  5.6   Lymphocytes Relative % 33  24  20   Lymphs Abs 0.7 - 4.0 K/uL 0.9  1.2  1.6   Monocytes Relative % 12  5  6    Monocytes Absolute 0.1 - 1.0 K/uL 0.3  0.2  0.5    Eosinophils Relative % 6  4  2    Eosinophils Absolute 0.0 - 0.5 K/uL 0.2  0.2  0.2   Basophils Relative % 1  1  1    Basophils Absolute 0.0 - 0.1 K/uL 0.0  0.0  0.1   Immature Granulocytes % 2  1  1    Abs Immature Granulocytes 0.00 - 0.07 K/uL 0.05  0.05 CM  0.11CM   Comment: Performed at [facility], [address], [place], [place] [address]  Resulting Agency  [facility] CL[facility] LAB [facility] CLIN LAB [facility] CLIN LAB     Specimen Collected: 09/12/19 11:14 Last Resulted: 09/12/19 11:33  Lab Flowsheet  Order Details  View Encounter  Lab and Collection Details  Routing  Result History   CM=Additional comments       Uric acid  Status: Final result Visible to patient: Yes (MyChart) Next appt: 09/24/2019 at 09:15 AM in Oncology ([facility]) Dx: Squamous cell carcinoma of lung, unsp...  Order: [id number]    Ref Range & Units 11:14 3 mo ago 5 mo ago  Uric Acid, Serum 2.5 - 7.1 mg/dL 3.7  3.5 CM  3.5 CM   Comment: Performed at [facility], [address], [place], [place] [address]  Resulting Agency  [facility] CLIN LAB [facility] CLIN LAB [facility] CLIN LAB     Specimen Collected: 09/12/19 11:14 Last Resulted: 09/12/19 11:48  Lab Flowsheet  Order Details  View Encounter  Lab and Collection Details  Routing  Result History   CM=Additional comments       Lactate dehydrogenase  Status: Final result Visible to patient: Yes (MyChart) Next appt: 09/24/2019 at 09:15 AM in Oncology ([place] 7) Dx: Squamous cell carcinoma of lung, unsp...  Order: [id number]    Ref Range & Units 11:14  (09/12/19) 3 mo ago  (06/11/19) 3 mo ago  (05/21/19)  LDH 98 - 192 U/L 150  140 CM  131 CM   Comment: Performed at [facility], [address], [place], [place] [address]  Resulting Agency  [facility] CLIN LAB [facility]  CLIN LAB [place] CLIN LAB     Specimen Collected: 09/12/19 11:14 Last Resulted: 09/12/19 11:48

## 2019-09-13 LAB — T4: T4, Total: 6.5 ug/dL (ref 4.5–12.0)

## 2019-09-13 LAB — TSH: TSH: 1.658 u[IU]/mL (ref 0.350–4.500)

## 2019-09-19 ENCOUNTER — Ambulatory Visit: Payer: Medicare HMO | Admitting: Radiation Oncology

## 2019-09-22 NOTE — Progress Notes (Signed)
[facility]  [address] [place], [place] [address] Phone: [phone]  Fax: [phone]   Clinic Day:  09/24/2019  Referring physician: Francesca Oman, DO  Chief Complaint: Toni Griffith is a 79 y.o. 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   HPI: The patient was last seen in the medical oncology clinic on 09/05/2019. At that time, her tinnitus was fading. Exam was stable. Hematocrit 34.7, hemoglobin 11.3, platelets 262,000, WBC 5,00. Calcium 8.4, total protein 6.2, albumin 2.7. She was on oral B12. She received day 8 Abraxane.   Labs on 02/12/202 showed hematocrit 34.9, hemoglobin 11.5, platelets 260,000, WBC 2,800 (ANC 1,300). Calcium 8.6. Albumin 2.9. TSH 1.658. Total T4 6.5. Uric acid 3.7. LDH 150. Magnesium 1.8.   During the interim, she felt "good". Her hair has been falling out in clumps. She notes that it makes her feel sad. Patients daughter expressed concern for her cough with associated phlegm. Robitussin recommended by Dr. Baruch Gouty made her cough less painful but did not resolve issue. Her daughter notes her voice sounds raspy and she was breathing hard. She notes a sore throat. She denies any fevers. She will see Dr. Baruch Gouty on 09/25/2019 for a follow up. He noted that coughing was a side effect of radiation.   Her chronic knee pain is the same. Her neuropathy is unchanged. She notes numbness in her 2nd and 3rd finger digits on her left hand. She notes the her fingers lock up at times when she is trying to communicate. She uses a stress ball to exercises her left hand. She notes dry skin. She has trouble with gripping which effects the way she is walking with her walker. The ringing in her ears has resolved.   Her heart rate was 114-120 and O2 93-95% today. Her BP is 124/75 today. She does feel short of breath with the mask  on.   Repeat vitals noted a pulse of 100 and blood pressure 139/85 (approximate baseline in clinic).  Patient agreed to treatment today.    Past Medical History:  Diagnosis Date  . Arthritis   . Cancer [facility])    lymphoma-right hip  . Deaf   . GERD (gastroesophageal reflux disease)   . Hypertension    NOT ON MED AT PRESENT  . Squamous cell lung cancer [facility])     Past Surgical History:  Procedure Laterality Date  . CARDIAC SURGERY  1993  . CORONARY ARTERY BYPASS GRAFT     DOUBLE  . CYST REMOVAL HAND Right   . ENDOBRONCHIAL ULTRASOUND N/A 05/30/2018   Procedure: ENDOBRONCHIAL ULTRASOUND;  Surgeon: Tyler Pita, MD;  Location: [facility] ORS;  Service: Cardiopulmonary;  Laterality: N/A;  . PERIPHERAL VASCULAR CATHETERIZATION N/A 04/06/2016   Procedure: Glori Luis Cath Insertion;  Surgeon: Algernon Huxley, MD;  Location: [facility];  Service: Cardiovascular;  Laterality: N/A;    Family History  Problem Relation Age of Onset  . Lung cancer Mother   . Lung cancer Father   . Diabetes Maternal Uncle   . Myasthenia gravis Maternal Grandmother   . Leukemia Maternal Grandfather     Social History:  reports that she quit smoking about 3 years ago. Her smoking use included cigarettes. She has a 80.00 pack-year smoking history. She has never used smokeless tobacco. She reports current alcohol use. She reports that she does not use drugs.  She smoked 1 1/2 packs per day for 20 years (30 pack year smoking history) until 10/2015. She is hearing impaired (deaf). She lives in [place] with her boyfriend. Her daughter lives in [place]. Her daughter, Toni Griffith's contact number is [phone].She has 2 cats named Toni Griffith and Toni Griffith.  The patient is accompanied by her daughter and her son via [employer] at home today.  Her daughter is interpreting for her mother. The patient is accompanied by interpreter Toni Griffith, and her daughter today.  Allergies: No Known Allergies  Current Medications: Current  Outpatient Medications  Medication Sig Dispense Refill  . acetaminophen (TYLENOL) 500 MG tablet Take 500 mg by mouth every 6 (six) hours as needed for moderate pain.     Marland Kitchen allopurinol (ZYLOPRIM) 300 MG tablet TAKE 1 TABLET BY MOUTH EVERY DAY 90 tablet 0  . aspirin EC 81 MG tablet Take 81 mg by mouth daily.    Marland Kitchen b complex vitamins tablet Take 1 tablet by mouth daily.    Carin Hock Iron (IRON CHEWS PEDIATRIC PO) Take 5 mg by mouth daily.    . Cholecalciferol (VITAMIN D3) 2000 units capsule Take 1,000 Units by mouth daily.     Marland Kitchen dextromethorphan 15 MG/5ML syrup Take 10 mLs by mouth 4 (four) times daily as needed for cough.    . DULoxetine (CYMBALTA) 60 MG capsule     . lidocaine-prilocaine (EMLA) cream Apply cream 1 hour before chemotherapy treatment and place small peive of saran wrap over cream to protect clothing 30 g 3  . Omega-3 Fatty Acids (FISH OIL PO) Take 1 tablet by mouth daily.    . ondansetron (ZOFRAN ODT) 4 MG disintegrating tablet Take 1 tablet (4 mg total) by mouth every 8 (eight) hours as needed for nausea or vomiting. 30 tablet 1  . pantoprazole (PROTONIX) 40 MG tablet Take 40 mg by mouth daily.     . vitamin E 400 UNIT capsule Take 400 Units by mouth daily.     Marland Kitchen atorvastatin (LIPITOR) 80 MG tablet Take 80 mg by mouth every evening.      No current facility-administered medications for this visit.   Facility-Administered Medications Ordered in Other Visits  Medication Dose Route Frequency Provider Last Rate Last Admin  . heparin lock flush 100 unit/mL  500 Units Intravenous Once Juwuan Sedita C, MD      . heparin lock flush 100 unit/mL  500 Units Intravenous Once Demetrice Combes C, MD      . sodium chloride flush (NS) 0.9 % injection 10 mL  10 mL Intravenous PRN Nolon Stalls C, MD   10 mL at 04/05/17 1034  . sodium chloride flush (NS) 0.9 % injection 10 mL  10 mL Intravenous PRN Lequita Asal, MD   10 mL at 02/27/19 1033  . sodium chloride flush (NS) 0.9 %  injection 10 mL  10 mL Intravenous PRN Lequita Asal, MD   10 mL at 09/24/19 0981    Review of Systems  Constitutional: Negative for chills, diaphoresis, fever, malaise/fatigue and weight loss (stable).       Feels "good".  HENT: Positive for hearing loss (deaf) and sore throat. Negative for congestion, ear discharge, ear pain (resolved), nosebleeds, sinus pain and tinnitus (resolved).        Raspy voice.  Eyes: Negative.  Negative for blurred vision, double vision and photophobia.  Respiratory: Positive for cough (on Robitussin; improving), sputum production (phlegm) and shortness of breath (on exertion). Negative for hemoptysis and  wheezing.   Cardiovascular: Negative.  Negative for chest pain, palpitations, orthopnea, leg swelling and PND.  Gastrointestinal: Negative.  Negative for abdominal pain, blood in stool, constipation, diarrhea, heartburn, melena, nausea and vomiting.  Genitourinary: Negative.  Negative for dysuria, flank pain, frequency, hematuria and urgency.  Musculoskeletal: Positive for joint pain (knees- chronic). Negative for back pain, myalgias and neck pain.  Skin: Negative.  Negative for itching and rash.       Hair falling out in clumps. Dry skin.  Neurological: Positive for sensory change (baseline neuropathy unchanged; left hand 2nd and 3rd digits). Negative for dizziness, tingling, tremors, speech change, focal weakness, seizures, weakness and headaches.       Fine motor skill issues. Fingers lock up when trying to communicate. Trouble with grip that effect how she gets around with walker.  Endo/Heme/Allergies: Negative.  Does not bruise/bleed easily.  Psychiatric/Behavioral: Negative.  Negative for depression, hallucinations and memory loss. The patient is not nervous/anxious and does not have insomnia.        Sad because of hair loss.  All other systems reviewed and are negative.  Performance status (ECOG): 1-2  Vitals Blood pressure 124/75, pulse (!) 117,  temperature 98 F (36.7 C), temperature source Tympanic, resp. rate 20, weight 176 lb (79.8 kg), SpO2 95 %.   Physical Exam  Constitutional: She is oriented to person, place, and time. Vital signs are normal. She appears well-developed and well-nourished. No distress.  Patient examined in wheelchair secondary to decreased mobility because of her knees.  HENT:  Head: Normocephalic and atraumatic.  Mouth/Throat: Oropharynx is clear and moist. No oropharyngeal exudate.  Bright yellow scarf head wrap.  Some gray hair protruding.  Mask.  Eyes: Pupils are equal, round, and reactive to light. Conjunctivae and EOM are normal. No scleral icterus.  Hazel eyes.  Neck: No JVD present.  Cardiovascular: Normal rate and normal heart sounds. Exam reveals no gallop.  No murmur heard. Pulmonary/Chest: Effort normal and breath sounds normal. No respiratory distress. She has no wheezes. She has no rales.  Abdominal: Soft. Bowel sounds are normal. She exhibits no distension and no mass. There is no abdominal tenderness. There is no rebound and no guarding.  Musculoskeletal:        General: No tenderness or edema. Normal range of motion.     Cervical back: Normal range of motion and neck supple.  Lymphadenopathy:       Head (right side): No preauricular, no posterior auricular and no occipital adenopathy present.       Head (left side): No preauricular, no posterior auricular and no occipital adenopathy present.    She has no cervical adenopathy.    She has no axillary adenopathy.       Right: No inguinal and no supraclavicular adenopathy present.       Left: No inguinal and no supraclavicular adenopathy present.  Neurological: She is alert and oriented to person, place, and time.  Skin: Skin is warm and dry. No rash noted. She is not diaphoretic. No erythema. No pallor.  Psychiatric: She has a normal mood and affect. Her behavior is normal. Judgment and thought content normal.  Nursing note and vitals  reviewed.   Infusion on 09/24/2019  Component Date Value Ref Range Status  . Sodium 09/24/2019 135  135 - 145 mmol/L Final  . Potassium 09/24/2019 3.6  3.5 - 5.1 mmol/L Final  . Chloride 09/24/2019 103  98 - 111 mmol/L Final  . CO2 09/24/2019 21* 22 - 32  mmol/L Final  . Glucose, Bld 09/24/2019 140* 70 - 99 mg/dL Final   Glucose reference range applies only to samples taken after fasting for at least 8 hours.  . BUN 09/24/2019 17  8 - 23 mg/dL Final  . Creatinine, Griffith 09/24/2019 0.90  0.44 - 1.00 mg/dL Final  . Calcium 09/24/2019 8.8* 8.9 - 10.3 mg/dL Final  . Total Protein 09/24/2019 7.2  6.5 - 8.1 g/dL Final  . Albumin 09/24/2019 3.3* 3.5 - 5.0 g/dL Final  . AST 09/24/2019 23  15 - 41 U/L Final  . ALT 09/24/2019 14  0 - 44 U/L Final  . Alkaline Phosphatase 09/24/2019 73  38 - 126 U/L Final  . Total Bilirubin 09/24/2019 0.8  0.3 - 1.2 mg/dL Final  . GFR calc non Af Amer 09/24/2019 >60  >60 mL/min Final  . GFR calc Af Amer 09/24/2019 >60  >60 mL/min Final  . Anion gap 09/24/2019 11  5 - 15 Final   Performed at [facility], [address], [place], [place] [address]  . WBC 09/24/2019 9.8  4.0 - 10.5 K/uL Final  . RBC 09/24/2019 3.99  3.87 - 5.11 MIL/uL Final  . Hemoglobin 09/24/2019 12.8  12.0 - 15.0 g/dL Final  . HCT 09/24/2019 38.9  36.0 - 46.0 % Final  . MCV 09/24/2019 97.5  80.0 - 100.0 fL Final  . MCH 09/24/2019 32.1  26.0 - 34.0 pg Final  . MCHC 09/24/2019 32.9  30.0 - 36.0 g/dL Final  . RDW 09/24/2019 16.5* 11.5 - 15.5 % Final  . Platelets 09/24/2019 396  150 - 400 K/uL Final  . nRBC 09/24/2019 0.0  0.0 - 0.2 % Final  . Neutrophils Relative % 09/24/2019 65  % Final  . Neutro Abs 09/24/2019 6.4  1.7 - 7.7 K/uL Final  . Lymphocytes Relative 09/24/2019 18  % Final  . Lymphs Abs 09/24/2019 1.8  0.7 - 4.0 K/uL Final  . Monocytes Relative 09/24/2019 13  % Final  . Monocytes Absolute 09/24/2019 1.2* 0.1 - 1.0 K/uL Final  . Eosinophils Relative 09/24/2019 2  % Final   . Eosinophils Absolute 09/24/2019 0.2  0.0 - 0.5 K/uL Final  . Basophils Relative 09/24/2019 1  % Final  . Basophils Absolute 09/24/2019 0.1  0.0 - 0.1 K/uL Final  . Immature Granulocytes 09/24/2019 1  % Final  . Abs Immature Granulocytes 09/24/2019 0.10* 0.00 - 0.07 K/uL Final   Performed at [facility], [address], [place], [place] [address]  . Uric Acid, Serum 09/24/2019 3.7  2.5 - 7.1 mg/dL Final   Performed at [facility], [address] [place], [place] [address]  . LDH 09/24/2019 130  98 - 192 U/L Final   Performed at [facility], [address], [place], [place] [address]  . Magnesium 09/24/2019 1.8  1.7 - 2.4 mg/dL Final   Performed at [facility], [address], [place],  [address]    Assessment:  Brittain Smithey is a 79 y.o. female withstage IVB B-cell lymphoma, unclassifiable, with features intermediate between diffuse large B-cell lymphoma and classical Hodgkin's lymphoma ("gray zone" lymphoma). She underwent right iliac wing bone/bone marrow biopsy on 03/08/2016.  PET scanon 03/03/2016 revealed a hypermetabolic left lower lobe mass, bilateral pleural and pulmonary parenchymal nodular metastasis, a large pleural lesion invading the anterior left chest wall. There was activity in the superior left ocular orbit. There was a large lesion centered in  the right iliac wing with soft tissue extension. There were hypermetabolic right external iliac lymph nodes  Echoon 04/04/2016 revealed an EF of 55-60%. Hepatitis B and C testing was negative on 03/30/2016. G6PD assay was normal. She declined LP with MTX prophylaxis.  She received 6 cycles of mini-RCHOP(04/07/2016 - 07/21/2016). She has had some transient fingertip numbness.  She received radiationto the right iliac wing, from 10/11/2016 - 11/01/2016. She received 3000 cGy over 3 weeks.  PET scanon 01/29/2017 revealed interval progression  of hypermetabolic nodules in the right upper (13 mm compared to 9 mm; SUV 10) and left lower lobes (26 mm compared to 22 mm; SUV 7.5). There was new hypermetabolic focus of activity along the anterior left pleura although no underlying pleural or lung mass was evident.  She has squamous cell lung cancer. Foundation Oneon the lung biopsy on 03/15/2018 revealed MS-stable, tumor mutational burden 5 Muts/Mb, AKT2 amplification, CCNE1 amplification, CREBBP R1446L, KDR amplification, KIT amplification, PDGFRA amplification, PTEN loss exons 2-4, and WC58 splice site [id number]. Marland Kitchen There were no alterations in EGFR, KRAS, RET, ALK, MET, ERBB2, BRAF, ROS1. PDL-1IHC analysis revealed a TPS score of 50%.  CT guided left lower lobe nodule biopsyon 02/19/2017 confirmed squamous cell carcinoma. She underwent SBRT08/27/2018 - 04/09/2017. She received 5000 cGy in 5 fractions to the left lower lobe lesion. She underwent SBRTto a RUL lesion from 05/23/2017 - 06/18/2017.  CT guided left upper lobe nodule biopsyon 02/20/2018 revealedsquamous cell lung cancer. She received SBRTfrom 04/09/2018 - 04/15/2018. She received 6000 cGy in 5 fractions.   Bronchoscopyon 05/30/2018 revealed no endobronchial lesions. The 2 cm pre-carinal lymph node was biopsied. Pathology revealed metastatic non-small cell carcinoma, favor squamous cell carcinoma.  CT guided right paraspinal lesion biopsyon 10/16/2017 was compatible with involvement by the patient's previously diagnosed lymphoma. The case was referred to [facility] hematopathology. The neoplastic cells were largely CD30 negative. Repeat CD30 immunohistochemistry faintly stained >10% of the large atypical cells. CD20 stained the scattered large atypical cells and few small cells.   She received 3 cycles of brentuximab vedotin(11/23/2017 - 01/11/2018).Her lymphoma progressed.  She is s/p14cycles of pembrolizumab(06/14/2018 -05/21/2019). She received a  steroid Dosepak on 01/09/2019 for minor chest CT changes felt secondary to pembrolizumab.  She is s/p cycle #1 pembolizumab, carboplatin and Abraxane (08/29/2019).  She experienced tinnitus after her first day of chemotherapy.  Chest, abdomen, and pelvis CTon 06/06/2019 revealed an interval mass-like opacity in the superior aspect of the left parahilar lungwith an interval more prominent mass-like component involving the pleura laterally at the level with slightly more prominent extension into the left hila region, suspicious for recurrent or progressive lung cancer. There was mediastinal and bilateral hilar adenopathyc/wmetastatic adenopathy or involvement with the patient's B-cell lymphoma. There was no evidence of metastatic disease or lymphoma involving the abdomen or pelvis. There was significantly improved ground-glass opacities in the right lung, minimal left pleural fluid(improved), andastable large hiatal hernia.There was stable enlarged thyroid gland containing heterogeneous nodules with substernal extension on the left.There was stable to slightly decreased size of the previously demonstrated right adrenal nodule and no significant change in a small, poorly visualized left adrenal nodule.  PET scanon 06/17/2019 revealed a known left parahilar lung mass markedly hypermetabolic with associated hypermetabolic lymphadenopathy in both hilar regions, the mediastinum, andthe right thoracic inlet. There was hypermetabolism in the right submandibular gland, indeterminate. There was no hypermetabolic metastatic disease evident in the abdomen or pelvis. There was a stable bilateral adrenal adenomas since 01/29/2018. There were tiny  left pleural effusion and a large hiatal hernia.There was a segment of transverse colon extendingup into the hiatal hernia without obstructive features.   Shereceived4000cGyto the left hilar regionfrom12/14/2020 -08/15/2019.  She hasiron deficiency  anemia. Ferritinhas been followed: 14 on 05/06/2018,23 on 07/26/2018, and 59 on 11/01/2018. She denies any bleeding.  She has a grade I neuropathy. B12 and folate were normal on 12/06/2017. She has severe bilateral knee arthritis. She has received steroid injections and Monovisc (hyaluronate derivatives) without improvement.   She is deafand requires an interpreter.  Symptomatically, she feels good.  She describes 2nd and 3rd digits of her left hand feel "funny".  Exam is stable.  Plan: 1.   Labs today: CBC with diff, CMP, Mg, TSH, LDH, uric acid.  2.Gray zone lymphoma Clinically,she denies any B symptoms. Examreveals no adenopathy or hepatosplenomegaly. Patient is s/p 6 cycles of mini-RCOP Patient is s/p 3 cycles ofbrentuximab vedotin. Patient is s/p 16 cycles of pembrolizumab. Patient had documented progressive lymphoma involving the right paraspinal region on 09/2017. Chest, abdomen and pelvis CT on 06/06/2019 revealed mediastinal and bilateral hilar adenopathy. PET scan on 06/17/2019 revealed progressive disease in the chest felt secondary toprogressive lung cancer. She is s/p 1 additional cycle of maintenance pembrolizumab (last 08/29/2019). Labs reviewed.  Cycle #2 maintenance pembrolizumab. 3. Squamous cell lung cancer Clinically, she is doing well.  She has a slight cough after radiation.       Patient is s/p SBRT to left upper lobe and left lower lobe nodules. Patient is s/p 15 cycles of pembrolizumab. Chest, abdomen, and pelvis CT on 06/06/2019 revealed mass-like opacity in the superior aspect of the left parahilar lung,mediastinal and bilateral hilar adenopathy. PET scan on11/17/2020 revealedaleft parahilar lung mass(SUV 16.9) withhypermetabolicbilateral hilar adenopathy, the mediastinum,andthe right thoracic inlet. Patient declined biopsy. Clinical stage T4N3Mx(stage IIIC). Patient is s/p  palliative radiation to the right hilar region (completed 08/15/2019). She is s/p cycle #1 carboplatin and Abraxane (08/29/2019). Abraxane 100 mg/m2 IV on days 1,8, and 15. Carboplatin AUC 5 on day 1. Cycle length every 21 days. Pembrolizumab incorporated for maintenance of gray zone lymphoma on day 1 She experienced significant tinnitus with cycle #1.       Labs reviewed.  Day 1 of cycle #2 carboplatin and Abraxane.        Discuss possible recurrence of tinnitus.   Patient consented to treatment.   Continue chemotherapy on Wednesdays.  Discuss symptom management.  She has antiemetics at home to use on a prn bases.  Interventions are adequate.    4.Tinnitus, resolved             Patient experienced significant tinnitus on day 4 s/p carboplatin.             Tinnitus subsequently resolved.             Discuss possible recurrence of tinnitus with chemotherapy.  Patient consented to treatment today. 5.   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 Monitor closely on Abraxane. 6. B12 deficiency Patient remains on oral B12. B12 and folate were normal on 12/17/2018. Check levels annually. 7.Bilateral knee pain She has baseline arthritis which may be exacerbated by pembrolizumab. She declined steroid injections. Patient with pain in clinic today.                         She received Tylenol.             Monitor  while on pembrolizumab. 8.Tachycardia  Etiology felt secondary to initial rush to clinic and mask.  Viral rechecked and patient back to baseline.   Continue to monitor. 9.   RTC in 1 week for MD assessment, labs (CBC with diff, CMP), and day 8 Abraxane.  I discussed the assessment and treatment plan with the patient.  The patient was provided an opportunity to ask questions and all were answered.  The  patient agreed with the plan and demonstrated an understanding of the instructions.  The patient was advised to call back if the symptoms worsen or if the condition fails to improve as anticipated.   Lequita Asal, MD, PhD    09/24/2019, 10:11 AM  I, Selena Batten, am acting as scribe for [employer]. Mike Gip, MD, PhD.  I, Christerpher Clos C. Mike Gip, MD, have reviewed the above documentation for accuracy and completeness, and I agree with the above.

## 2019-09-24 ENCOUNTER — Other Ambulatory Visit: Payer: Self-pay | Admitting: Hematology and Oncology

## 2019-09-24 ENCOUNTER — Inpatient Hospital Stay: Payer: Medicare HMO

## 2019-09-24 ENCOUNTER — Encounter: Payer: Self-pay | Admitting: Hematology and Oncology

## 2019-09-24 ENCOUNTER — Inpatient Hospital Stay (HOSPITAL_BASED_OUTPATIENT_CLINIC_OR_DEPARTMENT_OTHER): Payer: Medicare HMO | Admitting: Hematology and Oncology

## 2019-09-24 ENCOUNTER — Other Ambulatory Visit: Payer: Self-pay

## 2019-09-24 VITALS — BP 124/75 | HR 116 | Temp 98.0°F | Resp 20 | Wt 176.0 lb

## 2019-09-24 VITALS — BP 150/88 | HR 101 | Temp 98.9°F | Resp 20

## 2019-09-24 DIAGNOSIS — C3490 Malignant neoplasm of unspecified part of unspecified bronchus or lung: Secondary | ICD-10-CM

## 2019-09-24 DIAGNOSIS — C8518 Unspecified B-cell lymphoma, lymph nodes of multiple sites: Secondary | ICD-10-CM

## 2019-09-24 DIAGNOSIS — G620 Drug-induced polyneuropathy: Secondary | ICD-10-CM

## 2019-09-24 DIAGNOSIS — M25561 Pain in right knee: Secondary | ICD-10-CM

## 2019-09-24 DIAGNOSIS — C8590 Non-Hodgkin lymphoma, unspecified, unspecified site: Secondary | ICD-10-CM

## 2019-09-24 DIAGNOSIS — M170 Bilateral primary osteoarthritis of knee: Secondary | ICD-10-CM

## 2019-09-24 DIAGNOSIS — Z5111 Encounter for antineoplastic chemotherapy: Secondary | ICD-10-CM

## 2019-09-24 DIAGNOSIS — M25562 Pain in left knee: Secondary | ICD-10-CM

## 2019-09-24 DIAGNOSIS — G8929 Other chronic pain: Secondary | ICD-10-CM

## 2019-09-24 DIAGNOSIS — T451X5A Adverse effect of antineoplastic and immunosuppressive drugs, initial encounter: Secondary | ICD-10-CM

## 2019-09-24 DIAGNOSIS — E538 Deficiency of other specified B group vitamins: Secondary | ICD-10-CM

## 2019-09-24 DIAGNOSIS — Z5112 Encounter for antineoplastic immunotherapy: Secondary | ICD-10-CM

## 2019-09-24 LAB — COMPREHENSIVE METABOLIC PANEL
ALT: 14 U/L (ref 0–44)
AST: 23 U/L (ref 15–41)
Albumin: 3.3 g/dL — ABNORMAL LOW (ref 3.5–5.0)
Alkaline Phosphatase: 73 U/L (ref 38–126)
Anion gap: 11 (ref 5–15)
BUN: 17 mg/dL (ref 8–23)
CO2: 21 mmol/L — ABNORMAL LOW (ref 22–32)
Calcium: 8.8 mg/dL — ABNORMAL LOW (ref 8.9–10.3)
Chloride: 103 mmol/L (ref 98–111)
Creatinine, Ser: 0.9 mg/dL (ref 0.44–1.00)
GFR calc Af Amer: >60 mL/min (ref >60)
GFR calc non Af Amer: >60 mL/min (ref >60)
Glucose, Bld: 140 mg/dL — ABNORMAL HIGH (ref 70–99)
Potassium: 3.6 mmol/L (ref 3.5–5.1)
Sodium: 135 mmol/L (ref 135–145)
Total Bilirubin: 0.8 mg/dL (ref 0.3–1.2)
Total Protein: 7.2 g/dL (ref 6.5–8.1)

## 2019-09-24 LAB — CBC WITH DIFFERENTIAL/PLATELET
Abs Immature Granulocytes: 0.1 10*3/uL — ABNORMAL HIGH (ref 0.00–0.07)
Basophils Absolute: 0.1 10*3/uL (ref 0.0–0.1)
Basophils Relative: 1 %
Eosinophils Absolute: 0.2 10*3/uL (ref 0.0–0.5)
Eosinophils Relative: 2 %
HCT: 38.9 % (ref 36.0–46.0)
Hemoglobin: 12.8 g/dL (ref 12.0–15.0)
Immature Granulocytes: 1 %
Lymphocytes Relative: 18 %
Lymphs Abs: 1.8 10*3/uL (ref 0.7–4.0)
MCH: 32.1 pg (ref 26.0–34.0)
MCHC: 32.9 g/dL (ref 30.0–36.0)
MCV: 97.5 fL (ref 80.0–100.0)
Monocytes Absolute: 1.2 10*3/uL — ABNORMAL HIGH (ref 0.1–1.0)
Monocytes Relative: 13 %
Neutro Abs: 6.4 10*3/uL (ref 1.7–7.7)
Neutrophils Relative %: 65 %
Platelets: 396 10*3/uL (ref 150–400)
RBC: 3.99 MIL/uL (ref 3.87–5.11)
RDW: 16.5 % — ABNORMAL HIGH (ref 11.5–15.5)
WBC: 9.8 10*3/uL (ref 4.0–10.5)
nRBC: 0 % (ref 0.0–0.2)

## 2019-09-24 LAB — LACTATE DEHYDROGENASE: LDH: 130 U/L (ref 98–192)

## 2019-09-24 LAB — MAGNESIUM: Magnesium: 1.8 mg/dL (ref 1.7–2.4)

## 2019-09-24 LAB — URIC ACID: Uric Acid, Serum: 3.7 mg/dL (ref 2.5–7.1)

## 2019-09-24 MED ORDER — SODIUM CHLORIDE 0.9 % IV SOLN
420.0000 mg | Freq: Once | INTRAVENOUS | Status: AC
  Administered 2019-09-24: 15:00:00 420 mg via INTRAVENOUS
  Filled 2019-09-24: qty 42

## 2019-09-24 MED ORDER — SODIUM CHLORIDE 0.9 % IV SOLN
Freq: Once | INTRAVENOUS | Status: AC
  Filled 2019-09-24: qty 250

## 2019-09-24 MED ORDER — HEPARIN SOD (PORK) LOCK FLUSH 100 UNIT/ML IV SOLN
500.0000 [IU] | Freq: Once | INTRAVENOUS | Status: AC
  Administered 2019-09-24: 15:00:00 500 [IU] via INTRAVENOUS
  Filled 2019-09-24: qty 5

## 2019-09-24 MED ORDER — SODIUM CHLORIDE 0.9 % IV SOLN
200.0000 mg | Freq: Once | INTRAVENOUS | Status: AC
  Administered 2019-09-24: 13:00:00 200 mg via INTRAVENOUS
  Filled 2019-09-24: qty 8

## 2019-09-24 MED ORDER — SODIUM CHLORIDE 0.9 % IV SOLN
150.0000 mg | Freq: Once | INTRAVENOUS | Status: AC
  Administered 2019-09-24: 12:00:00 150 mg via INTRAVENOUS
  Filled 2019-09-24: qty 5

## 2019-09-24 MED ORDER — SODIUM CHLORIDE 0.9% FLUSH
10.0000 mL | INTRAVENOUS | Status: DC | PRN
  Administered 2019-09-24: 10 mL via INTRAVENOUS
  Filled 2019-09-24: qty 10

## 2019-09-24 MED ORDER — PACLITAXEL PROTEIN-BOUND CHEMO INJECTION 100 MG
100.0000 mg/m2 | Freq: Once | INTRAVENOUS | Status: AC
  Administered 2019-09-24: 14:00:00 175 mg via INTRAVENOUS
  Filled 2019-09-24: qty 35

## 2019-09-24 MED ORDER — SODIUM CHLORIDE 0.9 % IV SOLN
10.0000 mg | Freq: Once | INTRAVENOUS | Status: AC
  Administered 2019-09-24: 12:00:00 10 mg via INTRAVENOUS
  Filled 2019-09-24: qty 10

## 2019-09-24 MED ORDER — ACETAMINOPHEN 325 MG PO TABS
650.0000 mg | ORAL_TABLET | Freq: Once | ORAL | Status: AC
  Administered 2019-09-24: 13:00:00 650 mg via ORAL

## 2019-09-24 MED ORDER — PALONOSETRON HCL INJECTION 0.25 MG/5ML
0.2500 mg | Freq: Once | INTRAVENOUS | Status: AC
  Administered 2019-09-24: 12:00:00 0.25 mg via INTRAVENOUS
  Filled 2019-09-24: qty 5

## 2019-09-24 NOTE — Progress Notes (Signed)
Patient and daughter express concern about patient having a cough, she has been taking Robitussin as recommended by Dr Baruch Gouty and patient states that has made the cough less painful but it hasn't gone away and she is still having lots of phlegm. Daughter states that she can hear the patient breathing hard and voice sounds raspy. Patient heart rate was 114-120 and O2 93-95% today. BP was 124/75.

## 2019-09-25 ENCOUNTER — Other Ambulatory Visit: Payer: Self-pay | Admitting: *Deleted

## 2019-09-25 ENCOUNTER — Encounter: Payer: Self-pay | Admitting: Radiation Oncology

## 2019-09-25 ENCOUNTER — Other Ambulatory Visit: Payer: Self-pay

## 2019-09-25 ENCOUNTER — Ambulatory Visit
Admission: RE | Admit: 2019-09-25 | Discharge: 2019-09-25 | Disposition: A | Discharge status: Home / Self Care | Payer: Medicare HMO | Source: Ambulatory Visit | Attending: Radiation Oncology | Admitting: Radiation Oncology

## 2019-09-25 VITALS — BP 132/85 | HR 109 | Temp 97.4°F | Resp 20 | Wt 179.8 lb

## 2019-09-25 DIAGNOSIS — R911 Solitary pulmonary nodule: Secondary | ICD-10-CM | POA: Diagnosis not present

## 2019-09-25 DIAGNOSIS — C8580 Other specified types of non-Hodgkin lymphoma, unspecified site: Secondary | ICD-10-CM | POA: Insufficient documentation

## 2019-09-25 DIAGNOSIS — Z9221 Personal history of antineoplastic chemotherapy: Secondary | ICD-10-CM | POA: Insufficient documentation

## 2019-09-25 DIAGNOSIS — C3492 Malignant neoplasm of unspecified part of left bronchus or lung: Secondary | ICD-10-CM

## 2019-09-25 DIAGNOSIS — Z923 Personal history of irradiation: Secondary | ICD-10-CM | POA: Insufficient documentation

## 2019-09-25 LAB — T4: T4, Total: 7.4 ug/dL (ref 4.5–12.0)

## 2019-09-25 NOTE — Progress Notes (Signed)
Radiation Oncology Follow up Note  Name: Toni Griffith   Date:   09/25/2019 MRN:  [id number] DOB: 11/20/1940    This 79 y.o. 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  Who is had repeated courses of radiation therapy to her chest.  She has a diagnosis of a stage IV B-cell lymphoma.  She had a left hilar mass recently back in November patient was not interested in biopsy we treated with palliation she is now seen at 1 month doing well she still has a cough slightly productive and clear.  No hemoptysis no chest tightness.  She is currently on carboplatinum and Abraxane on alternate weeks.  Pembro is also being used for gray zone lymphoma  COMPLICATIONS OF TREATMENT: none  FOLLOW UP COMPLIANCE: keeps appointments   PHYSICAL EXAM:  BP 132/85   Pulse (!) 109   Temp (!) 97.4 F (36.3 C) (Tympanic)   Resp 20   Wt 179 lb 12.8 oz (81.6 kg)   BMI 31.85 kg/m  Well-developed wheelchair-bound female in NAD.  Well-developed well-nourished patient in NAD. HEENT reveals PERLA, EOMI, discs not visualized.  Oral cavity is clear. No oral mucosal lesions are identified. Neck is clear without evidence of cervical or supraclavicular adenopathy. Lungs are clear to A&P. Cardiac examination is essentially unremarkable with regular rate and rhythm without murmur rub or thrill. Abdomen is benign with no organomegaly or masses noted. Motor sensory and DTR levels are equal and symmetric in the upper and lower extremities. Cranial nerves II through XII are grossly intact. Proprioception is intact. No peripheral adenopathy or edema is identified. No motor or sensory levels are noted. Crude visual fields are within normal range.  RADIOLOGY RESULTS: CT scan has been  scheduled for May  PLAN: Present time patient is doing well tolerated her salvage radiation therapy extremely well.  Very little side effects or complaints at this point time.  I have asked to see her back in 3 months for follow-up.  We will check her CT scan in May.  She continues close follow-up care and treatment with medical oncology.  Family knows to call with any concerns.  I would like to take this opportunity to thank you for allowing me to participate in the care of your patient.Noreene Filbert, MD

## 2019-09-26 ENCOUNTER — Telehealth: Payer: Self-pay | Admitting: *Deleted

## 2019-09-26 NOTE — Telephone Encounter (Signed)
RN made phone call to patients POA Daughter [name] as patient is deaf. Call placed to follow up on how patient is feeling after her chemotherapy treatment on Weds of this week. Daughter states, "wow are you starting something new?, no one has called before". Daughter states she is doing fine so far. I instructed her to really encourage her mother to drink a lot of fluid daily. Make sure her bowels are moving regularly. She needs to let MD know if neuropathy worsens or tinnitus returns. Daughter states mom is eating pretty good. Daughter appreciated the call today. Reminded her there is an MD on call nights and weekends should any problems or concerns occur.

## 2019-09-29 NOTE — Progress Notes (Signed)
[facility]  [address], [address] [place], [place] [address] Phone: [phone]  Fax: [phone]   Clinic Day:  10/01/2019  Referring physician: Francesca Oman, DO  Chief Complaint: Toni Griffith is a 79 y.o. 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   HPI: The patient was last seen in the medical oncology clinic on 09/24/2019. At that time, she felt good.  She described 2nd and 3rd digits of her left hand feel "funny".  Exam was stable. Hematocrit 38.9, hemoglobin 12.8, platelets 396,000, WBC 9,800. Calcium 8.8. Albumin 3.3. Uric acid 3.7. LDH 130.  Magnesium 1.8.  She received day 1 of cycle # 2 carboplatin, Abraxane, and pembrolizumab.   She was seen by Dr. Baruch Gouty on 09/25/2019.  She was doing well. She had a slightly productive cough with clear sputum. She denied any hemoptysis or chest tighteness. She is scheduled for follow up chest CT scan on 12/24/2019. She will follow up on 12/31/2019.   During the interim, she has been doing good. Patient reported having diarrhea after chemotherapy x 1-2 days. Her bowels were loose. Her daughter notes diarrhea on 09/28/2019. She denies any blood or mucus. She did feel a little nauseated on 09/28/2019. Her daughter notes that she has had similar episodes before chemotherapy. She notes her mother has these diarrheal episodes on and off.   She also reports the ringing in her ears is much better than the first treatment. She reports she is also having a burning sensation on the bottom of her feet every night (before chemotherapy). She feels like the chemotherapy has not increased the burning sensation. She has some numbness in her fingertips on her left hand. She notes restless legs.  Her sore throat is gone. She has less coughing. Her fingers tend to lock up when signing on occasion. She has a better grip. She feels tired but she is  no longer sad. The patient just wants "this to be over with". She is interested in getting the COVID-19 vaccine. I advised her to not get the vaccine when her counts are low.    Past Medical History:  Diagnosis Date  . Arthritis   . Cancer [facility])    lymphoma-right hip  . Deaf   . GERD (gastroesophageal reflux disease)   . Hypertension    NOT ON MED AT PRESENT  . Squamous cell lung cancer [facility])     Past Surgical History:  Procedure Laterality Date  . CARDIAC SURGERY  1993  . CORONARY ARTERY BYPASS GRAFT     DOUBLE  . CYST REMOVAL HAND Right   . ENDOBRONCHIAL ULTRASOUND N/A 05/30/2018   Procedure: ENDOBRONCHIAL ULTRASOUND;  Surgeon: Tyler Pita, MD;  Location: [facility] ORS;  Service: Cardiopulmonary;  Laterality: N/A;  . PERIPHERAL VASCULAR CATHETERIZATION N/A 04/06/2016   Procedure: Glori Luis Cath Insertion;  Surgeon: Algernon Huxley, MD;  Location: [facility];  Service: Cardiovascular;  Laterality: N/A;    Family History  Problem Relation Age of Onset  . Lung cancer Mother   . Lung cancer Father   . Diabetes Maternal Uncle   . Myasthenia gravis Maternal Grandmother   . Leukemia Maternal Grandfather     Social History:  reports that she quit smoking about 3 years ago. Her smoking use included cigarettes. She has a 80.00 pack-year smoking history. She has never used smokeless tobacco. She reports current alcohol  use. She reports that she does not use drugs.  She smoked 1 1/2 packs per day for 20 years (30 pack year smoking history) until 10/2015. She is hearing impaired (deaf). She lives in [place] with her boyfriend. Her daughter lives in [place]. Her daughter, [name]'s contact number is [phone].She has 2 cats named Pepper and Baby.The patient is accompanied byher daughter and her sonvia Doximity at [employer].Her daughter is interpreting for her mother. The patient is accompanied by the interpreter Macy Mis and her daughter today.  Allergies: No Known  Allergies  Current Medications: Current Outpatient Medications  Medication Sig Dispense Refill  . acetaminophen (TYLENOL) 500 MG tablet Take 500 mg by mouth every 6 (six) hours as needed for moderate pain.     Marland Kitchen allopurinol (ZYLOPRIM) 300 MG tablet TAKE 1 TABLET BY MOUTH EVERY DAY 90 tablet 0  . aspirin EC 81 MG tablet Take 81 mg by mouth daily.    Marland Kitchen atorvastatin (LIPITOR) 80 MG tablet Take 80 mg by mouth every evening.     Marland Kitchen b complex vitamins tablet Take 1 tablet by mouth daily.    Carin Hock Iron (IRON CHEWS PEDIATRIC PO) Take 5 mg by mouth daily.    . Cholecalciferol (VITAMIN D3) 2000 units capsule Take 1,000 Units by mouth daily.     . DULoxetine (CYMBALTA) 60 MG capsule     . lidocaine-prilocaine (EMLA) cream Apply cream 1 hour before chemotherapy treatment and place small peive of saran wrap over cream to protect clothing 30 g 3  . Omega-3 Fatty Acids (FISH OIL PO) Take 1 tablet by mouth daily.    . ondansetron (ZOFRAN ODT) 4 MG disintegrating tablet Take 1 tablet (4 mg total) by mouth every 8 (eight) hours as needed for nausea or vomiting. 30 tablet 1  . pantoprazole (PROTONIX) 40 MG tablet Take 40 mg by mouth daily.     . vitamin E 400 UNIT capsule Take 400 Units by mouth daily.     Marland Kitchen dextromethorphan 15 MG/5ML syrup Take 10 mLs by mouth 4 (four) times daily as needed for cough.     No current facility-administered medications for this visit.   Facility-Administered Medications Ordered in Other Visits  Medication Dose Route Frequency Provider Last Rate Last Admin  . heparin lock flush 100 unit/mL  500 Units Intravenous Once Emelio Schneller C, MD      . heparin lock flush 100 unit/mL  500 Units Intravenous Once Cheral Cappucci C, MD      . sodium chloride flush (NS) 0.9 % injection 10 mL  10 mL Intravenous PRN Nolon Stalls C, MD   10 mL at 04/05/17 1034  . sodium chloride flush (NS) 0.9 % injection 10 mL  10 mL Intravenous PRN Lequita Asal, MD   10 mL at 02/27/19  1033  . sodium chloride flush (NS) 0.9 % injection 10 mL  10 mL Intravenous PRN Lequita Asal, MD   10 mL at 10/01/19 1021    Review of Systems  Constitutional: Negative.  Negative for chills, diaphoresis, fever, malaise/fatigue and weight loss (stable).       Feels great.  HENT: Positive for hearing loss (deaf). Negative for congestion, ear discharge, ear pain, nosebleeds, sinus pain, sore throat (resolved) and tinnitus (better).   Eyes: Negative.  Negative for blurred vision, double vision and photophobia.  Respiratory: Positive for shortness of breath (on exertion). Negative for cough (improving), hemoptysis, sputum production and wheezing.   Cardiovascular: Negative.  Negative  for chest pain, palpitations, orthopnea, leg swelling and PND.  Gastrointestinal: Positive for diarrhea (after chemo; loose; x 1-2) and nausea (09/28/2019). Negative for abdominal pain, blood in stool, constipation, heartburn, melena and vomiting.  Genitourinary: Negative.  Negative for dysuria, flank pain, frequency, hematuria and urgency.  Musculoskeletal: Negative for back pain, joint pain (knees- chronic), myalgias and neck pain.  Skin: Negative.  Negative for itching and rash.       Dry skin.  Neurological: Positive for sensory change (baseline neuropathy unchanged; left hand 2nd and 3rd digits; burning sensation on feet at night). Negative for dizziness, tingling, tremors, speech change, focal weakness, seizures, weakness and headaches.       Fine motor skill issues. Fingers lock up when trying to communicate. Trouble with grip is better. Restless legs.  Endo/Heme/Allergies: Negative.  Does not bruise/bleed easily.  Psychiatric/Behavioral: Negative.  Negative for depression, hallucinations and memory loss. The patient is not nervous/anxious and does not have insomnia.   All other systems reviewed and are negative.  Performance status (ECOG): 1-2  Vitals Blood pressure 110/62, pulse (!) 109, temperature  (!) 97.5 F (36.4 C), temperature source Tympanic, resp. rate 18, weight 176 lb 4.8 oz (80 kg), SpO2 98 %.   Physical Exam  Constitutional: She is oriented to person, place, and time. Vital signs are normal. She appears well-developed and well-nourished. No distress.  Patient examined in wheelchair secondary to decreased mobility because of her knees.  HENT:  Head: Normocephalic and atraumatic.  Mouth/Throat: Oropharynx is clear and moist. No oropharyngeal exudate.  Pearline Cables hat.  Mask.  Eyes: Pupils are equal, round, and reactive to light. Conjunctivae and EOM are normal. No scleral icterus.  Hazel eyes.  Neck: No JVD present.  Cardiovascular: Normal rate and normal heart sounds. Exam reveals no gallop.  No murmur heard. Pulmonary/Chest: Effort normal and breath sounds normal. No respiratory distress. She has no wheezes. She has no rales.  Abdominal: Soft. Bowel sounds are normal. She exhibits no distension and no mass. There is no abdominal tenderness. There is no rebound and no guarding.  Musculoskeletal:        General: No tenderness or edema. Normal range of motion.     Cervical back: Normal range of motion and neck supple.  Lymphadenopathy:       Head (right side): No preauricular, no posterior auricular and no occipital adenopathy present.       Head (left side): No preauricular, no posterior auricular and no occipital adenopathy present.    She has no cervical adenopathy.    She has no axillary adenopathy.       Right: No inguinal and no supraclavicular adenopathy present.       Left: No inguinal and no supraclavicular adenopathy present.  Neurological: She is alert and oriented to person, place, and time.  Skin: Skin is warm and dry. No rash noted. She is not diaphoretic. No erythema. No pallor.  Psychiatric: She has a normal mood and affect. Her behavior is normal. Judgment and thought content normal.  Nursing note and vitals reviewed.   Infusion on 10/01/2019  Component Date  Value Ref Range Status  . Sodium 10/01/2019 134* 135 - 145 mmol/L Final  . Potassium 10/01/2019 3.8  3.5 - 5.1 mmol/L Final  . Chloride 10/01/2019 101  98 - 111 mmol/L Final  . CO2 10/01/2019 24  22 - 32 mmol/L Final  . Glucose, Bld 10/01/2019 144* 70 - 99 mg/dL Final   Glucose reference range applies only to  samples taken after fasting for at least 8 hours.  . BUN 10/01/2019 20  8 - 23 mg/dL Final  . Creatinine, Ser 10/01/2019 0.86  0.44 - 1.00 mg/dL Final  . Calcium 10/01/2019 9.0  8.9 - 10.3 mg/dL Final  . Total Protein 10/01/2019 6.8  6.5 - 8.1 g/dL Final  . Albumin 10/01/2019 3.1* 3.5 - 5.0 g/dL Final  . AST 10/01/2019 23  15 - 41 U/L Final  . ALT 10/01/2019 16  0 - 44 U/L Final  . Alkaline Phosphatase 10/01/2019 70  38 - 126 U/L Final  . Total Bilirubin 10/01/2019 0.6  0.3 - 1.2 mg/dL Final  . GFR calc non Af Amer 10/01/2019 >60  >60 mL/min Final  . GFR calc Af Amer 10/01/2019 >60  >60 mL/min Final  . Anion gap 10/01/2019 9  5 - 15 Final   Performed at [facility], [address], [place], [place] [address]  . WBC 10/01/2019 6.6  4.0 - 10.5 K/uL Final  . RBC 10/01/2019 3.70* 3.87 - 5.11 MIL/uL Final  . Hemoglobin 10/01/2019 11.9* 12.0 - 15.0 g/dL Final  . HCT 10/01/2019 36.2  36.0 - 46.0 % Final  . MCV 10/01/2019 97.8  80.0 - 100.0 fL Final  . MCH 10/01/2019 32.2  26.0 - 34.0 pg Final  . MCHC 10/01/2019 32.9  30.0 - 36.0 g/dL Final  . RDW 10/01/2019 16.1* 11.5 - 15.5 % Final  . Platelets 10/01/2019 303  150 - 400 K/uL Final  . nRBC 10/01/2019 0.0  0.0 - 0.2 % Final  . Neutrophils Relative % 10/01/2019 65  % Final  . Neutro Abs 10/01/2019 4.3  1.7 - 7.7 K/uL Final  . Lymphocytes Relative 10/01/2019 18  % Final  . Lymphs Abs 10/01/2019 1.2  0.7 - 4.0 K/uL Final  . Monocytes Relative 10/01/2019 6  % Final  . Monocytes Absolute 10/01/2019 0.4  0.1 - 1.0 K/uL Final  . Eosinophils Relative 10/01/2019 9  % Final  . Eosinophils Absolute 10/01/2019 0.6* 0.0 - 0.5 K/uL  Final  . Basophils Relative 10/01/2019 1  % Final  . Basophils Absolute 10/01/2019 0.0  0.0 - 0.1 K/uL Final  . Immature Granulocytes 10/01/2019 1  % Final  . Abs Immature Granulocytes 10/01/2019 0.07  0.00 - 0.07 K/uL Final   Performed at [facility], [address], [place], [place] [address]    Assessment:  Toni Griffith is a 79 y.o. female withstage IVB B-cell lymphoma, unclassifiable, with features intermediate between diffuse large B-cell lymphoma and classical Hodgkin's lymphoma ("gray zone" lymphoma). She underwent right iliac wing bone/bone marrow biopsy on 03/08/2016.  PET scanon 03/03/2016 revealed a hypermetabolic left lower lobe mass, bilateral pleural and pulmonary parenchymal nodular metastasis, a large pleural lesion invading the anterior left chest wall. There was activity in the superior left ocular orbit. There was a large lesion centered in the right iliac wing with soft tissue extension. There were hypermetabolic right external iliac lymph nodes  [place]oon 04/04/2016 revealed an EF of 55-60%. Hepatitis B and C testing was negative on 03/30/2016. G6PD assay was normal. She declined LP with MTX prophylaxis.  She received 6 cycles of mini-RCHOP(04/07/2016 - 07/21/2016). She has had some transient fingertip numbness.  She received radiationto the right iliac wing, from 10/11/2016 - 11/01/2016. She received 3000 cGy over 3 weeks.  PET scanon 01/29/2017 revealed interval progression of hypermetabolic nodules in the right upper (13 mm compared to 9 mm; SUV 10) and  left lower lobes (26 mm compared to 22 mm; SUV 7.5). There was new hypermetabolic focus of activity along the anterior left pleura although no underlying pleural or lung mass was evident.   She has squamous cell lung cancer. Foundation Oneon the lung biopsy on 03/15/2018 revealed MS-stable, tumor mutational burden 5 Muts/Mb, AKT2 amplification, CCNE1 amplification, CREBBP  R1446L, KDR amplification, KIT amplification, PDGFRA amplification, PTEN loss exons 2-4, and XB14 splice site [id number]. Marland Kitchen There were no alterations in EGFR, KRAS, RET, ALK, MET, ERBB2, BRAF, ROS1. PDL-1IHC analysis revealed a TPS score of 50%.  CT guided left lower lobe nodule biopsyon 02/19/2017 confirmed squamous cell carcinoma. She underwent SBRT08/27/2018 - 04/09/2017. She received 5000 cGy in 5 fractions to the left lower lobe lesion. She underwent SBRTto a RUL lesion from 05/23/2017 - 06/18/2017.  CT guided left upper lobe nodule biopsyon 02/20/2018 revealedsquamous cell lung cancer. She received SBRTfrom 04/09/2018 - 04/15/2018. She received 6000 cGy in 5 fractions.   Bronchoscopyon 05/30/2018 revealed no endobronchial lesions. The 2 cm pre-carinal lymph node was biopsied. Pathology revealed metastatic non-small cell carcinoma, favor squamous cell carcinoma.  CT guided right paraspinal lesion biopsyon 10/16/2017 was compatible with involvement by the patient's previously diagnosed lymphoma. The case was referred to [facility] hematopathology. The neoplastic cells were largely CD30 negative. Repeat CD30 immunohistochemistry faintly stained >10% of the large atypical cells. CD20 stained the scattered large atypical cells and few small cells.   She received 3 cycles of brentuximab vedotin(11/23/2017 - 01/11/2018).Her lymphoma progressed.  She is s/p14cycles of pembrolizumab(06/14/2018 -05/21/2019). She received a steroid Dosepak on 01/09/2019 for minor chest CT changes felt secondary to pembrolizumab.  She is day 8 of cycle #2 pembolizumab, carboplatin and Abraxane (08/29/2019 - 09/24/2019). She experienced tinnitus after her first day of chemotherapy.  Chest, abdomen, and pelvis CTon 06/06/2019 revealed an interval mass-like opacity in the superior aspect of the left parahilar lungwith an interval more prominent mass-like component involving the pleura  laterally at the level with slightly more prominent extension into the left hila region, suspicious for recurrent or progressive lung cancer. There was mediastinal and bilateral hilar adenopathyc/wmetastatic adenopathy or involvement with the patient's B-cell lymphoma. There was no evidence of metastatic disease or lymphoma involving the abdomen or pelvis. There was significantly improved ground-glass opacities in the right lung, minimal left pleural fluid(improved), andastable large hiatal hernia.There was stable enlarged thyroid gland containing heterogeneous nodules with substernal extension on the left.There was stable to slightly decreased size of the previously demonstrated right adrenal nodule and no significant change in a small, poorly visualized left adrenal nodule.  PET scanon 06/17/2019 revealed a known left parahilar lung mass markedly hypermetabolic with associated hypermetabolic lymphadenopathy in both hilar regions, the mediastinum, andthe right thoracic inlet. There was hypermetabolism in the right submandibular gland, indeterminate. There was no hypermetabolic metastatic disease evident in the abdomen or pelvis. There was a stable bilateral adrenal adenomas since 01/29/2018. There were tiny left pleural effusion and a large hiatal hernia.There was a segment of transverse colon extendingup into the hiatal hernia without obstructive features.   Shereceived4000cGyto the left hilar regionfrom12/14/2020 -08/15/2019.  She hasiron deficiency anemia. Ferritinhas been followed: 14 on 05/06/2018,23 on 07/26/2018, and 59 on 11/01/2018. She denies any bleeding.  She has a grade I neuropathy. B12 and folate were normal on 12/06/2017. She has severe bilateral knee arthritis. She has received steroid injections and Monovisc (hyaluronate derivatives) without improvement.   She is deafand requires an interpreter.  Symptomatically, she is doing well.  Her cough has  improved.  She has transient diarrhea after her chemotherapy.  Exam is stable.  Plan: 1.   Labs today: CBC with diff, CMP. 2.Gray zone lymphoma Clinically,she is doing well without B symptoms. Examreveals no adenopathy or hepatosplenomegaly. Patient is s/p 6 cycles of mini-RCOP Patient is s/p 3 cycles ofbrentuximab vedotin. Patient is s/p 16cycles of pembrolizumab. Patient had documented progressive lymphoma involving the right paraspinal region on 09/2017. Chest, abdomen and pelvis CT on 06/06/2019 revealed mediastinal and bilateral hilar adenopathy. PET scan on 06/17/2019 revealed progressive disease in the chest felt secondary toprogressive lung cancer. She is s/p 2 cycles of maintenance pembrolizumab (last 09/24/2019). 3. Squamous cell lung cancer Clinically, she is doing well.  Her cough is subsiding s/p radiation. Patient is s/p SBRT to left upper lobe and left lower lobe nodules. Patient is s/p 15cyclesof pembrolizumab. Chest, abdomen, and pelvis CT on 06/06/2019 revealed mass-like opacity in the superior aspect of the left parahilar lung,mediastinal and bilateral hilar adenopathy. PET scan on11/17/2020 revealedaleft parahilar lung mass(SUV 16.9) withhypermetabolicbilateral hilar adenopathy, the mediastinum,andthe right thoracic inlet. Patient declined biopsy. Clinical stage T4N3Mx(stage IIIC). Patient is s/p palliative radiation to the right hilar region (completed 08/15/2019). She is day 8 of cycle #1 carboplatin and Abraxane (02/242021). Abraxane 100 mg/m2 IV on days 1,8, and 15. Carboplatin AUC 5 on day 1. Cycle length every 21 days. Pembrolizumab incorporated for maintenance of gray zone lymphoma on day 1 She experienced less tinnitus with cycle #2 carboplatin. Labs reviewed.  Day 8 of cycle #2  Abraxane.   Continue to monitor for neuropathy. Discuss symptom management.  She has antiemetics at home to use on a prn bases.  Interventions are adequate. 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 Continue to monitor closely on a Abraxane. 6. B12 deficiency She continues oral B12. B12 and folate were normal on 12/17/2018. Check levels annually. 7.Bilateral knee pain She has baseline arthritis which may be exacerbated by pembrolizumab. Shedeclinedsteroid injections. She receives Tylenol in clinic for her pain.  Monitor closely on pembrolizumab. 8.   Day 8 of cycle #2 Abraxane today. 9.   RTC on 03/17 for MD assessment, labs (CBC with diff, CMP, Mg, TSH, T4, LDH, uric acid), and day 1 of cycle #3 pembrolizumab, carboplatin and Abraxane.  I discussed the assessment and treatment plan with the patient.  The patient was provided an opportunity to ask questions and all were answered.  The patient agreed with the plan and demonstrated an understanding of the instructions.  The patient was advised to call back if the symptoms worsen or if the condition fails to improve as anticipated.   Lequita Asal, MD, PhD    10/01/2019, 10:48 AM  I, Selena Batten, am acting as scribe for [employer]. Mike Gip, MD, PhD.  I, Sherryl Valido C. Mike Gip, MD, have reviewed the above documentation for accuracy and completeness, and I agree with the above.

## 2019-10-01 ENCOUNTER — Inpatient Hospital Stay: Payer: Medicare HMO | Attending: Hematology and Oncology | Admitting: Hematology and Oncology

## 2019-10-01 ENCOUNTER — Inpatient Hospital Stay: Payer: Medicare HMO

## 2019-10-01 ENCOUNTER — Encounter: Payer: Self-pay | Admitting: Hematology and Oncology

## 2019-10-01 ENCOUNTER — Other Ambulatory Visit: Payer: Self-pay

## 2019-10-01 VITALS — BP 110/62 | HR 109 | Temp 97.5°F | Resp 18 | Wt 176.3 lb

## 2019-10-01 VITALS — BP 124/82 | HR 95 | Temp 98.1°F | Resp 18

## 2019-10-01 DIAGNOSIS — R197 Diarrhea, unspecified: Secondary | ICD-10-CM | POA: Diagnosis not present

## 2019-10-01 DIAGNOSIS — C3432 Malignant neoplasm of lower lobe, left bronchus or lung: Secondary | ICD-10-CM | POA: Diagnosis not present

## 2019-10-01 DIAGNOSIS — Z5112 Encounter for antineoplastic immunotherapy: Secondary | ICD-10-CM | POA: Diagnosis present

## 2019-10-01 DIAGNOSIS — C8330 Diffuse large B-cell lymphoma, unspecified site: Secondary | ICD-10-CM | POA: Diagnosis present

## 2019-10-01 DIAGNOSIS — M25562 Pain in left knee: Secondary | ICD-10-CM

## 2019-10-01 DIAGNOSIS — H9190 Unspecified hearing loss, unspecified ear: Secondary | ICD-10-CM | POA: Insufficient documentation

## 2019-10-01 DIAGNOSIS — D3501 Benign neoplasm of right adrenal gland: Secondary | ICD-10-CM | POA: Diagnosis not present

## 2019-10-01 DIAGNOSIS — E538 Deficiency of other specified B group vitamins: Secondary | ICD-10-CM | POA: Diagnosis not present

## 2019-10-01 DIAGNOSIS — M170 Bilateral primary osteoarthritis of knee: Secondary | ICD-10-CM

## 2019-10-01 DIAGNOSIS — D509 Iron deficiency anemia, unspecified: Secondary | ICD-10-CM | POA: Diagnosis not present

## 2019-10-01 DIAGNOSIS — Z806 Family history of leukemia: Secondary | ICD-10-CM | POA: Diagnosis not present

## 2019-10-01 DIAGNOSIS — Z833 Family history of diabetes mellitus: Secondary | ICD-10-CM | POA: Diagnosis not present

## 2019-10-01 DIAGNOSIS — R590 Localized enlarged lymph nodes: Secondary | ICD-10-CM | POA: Insufficient documentation

## 2019-10-01 DIAGNOSIS — E876 Hypokalemia: Secondary | ICD-10-CM | POA: Diagnosis not present

## 2019-10-01 DIAGNOSIS — Z801 Family history of malignant neoplasm of trachea, bronchus and lung: Secondary | ICD-10-CM | POA: Insufficient documentation

## 2019-10-01 DIAGNOSIS — D75A Glucose-6-phosphate dehydrogenase (G6PD) deficiency without anemia: Secondary | ICD-10-CM | POA: Insufficient documentation

## 2019-10-01 DIAGNOSIS — Z923 Personal history of irradiation: Secondary | ICD-10-CM | POA: Insufficient documentation

## 2019-10-01 DIAGNOSIS — K449 Diaphragmatic hernia without obstruction or gangrene: Secondary | ICD-10-CM | POA: Insufficient documentation

## 2019-10-01 DIAGNOSIS — C8518 Unspecified B-cell lymphoma, lymph nodes of multiple sites: Secondary | ICD-10-CM | POA: Diagnosis not present

## 2019-10-01 DIAGNOSIS — H9319 Tinnitus, unspecified ear: Secondary | ICD-10-CM | POA: Diagnosis not present

## 2019-10-01 DIAGNOSIS — C8590 Non-Hodgkin lymphoma, unspecified, unspecified site: Secondary | ICD-10-CM

## 2019-10-01 DIAGNOSIS — G2581 Restless legs syndrome: Secondary | ICD-10-CM | POA: Diagnosis not present

## 2019-10-01 DIAGNOSIS — G8929 Other chronic pain: Secondary | ICD-10-CM

## 2019-10-01 DIAGNOSIS — D3502 Benign neoplasm of left adrenal gland: Secondary | ICD-10-CM | POA: Diagnosis not present

## 2019-10-01 DIAGNOSIS — G620 Drug-induced polyneuropathy: Secondary | ICD-10-CM

## 2019-10-01 DIAGNOSIS — Z820 Family history of epilepsy and other diseases of the nervous system: Secondary | ICD-10-CM | POA: Insufficient documentation

## 2019-10-01 DIAGNOSIS — Z79899 Other long term (current) drug therapy: Secondary | ICD-10-CM | POA: Insufficient documentation

## 2019-10-01 DIAGNOSIS — T451X5A Adverse effect of antineoplastic and immunosuppressive drugs, initial encounter: Secondary | ICD-10-CM | POA: Diagnosis not present

## 2019-10-01 DIAGNOSIS — C3490 Malignant neoplasm of unspecified part of unspecified bronchus or lung: Secondary | ICD-10-CM

## 2019-10-01 DIAGNOSIS — Z5111 Encounter for antineoplastic chemotherapy: Secondary | ICD-10-CM

## 2019-10-01 LAB — COMPREHENSIVE METABOLIC PANEL
ALT: 16 U/L (ref 0–44)
AST: 23 U/L (ref 15–41)
Albumin: 3.1 g/dL — ABNORMAL LOW (ref 3.5–5.0)
Alkaline Phosphatase: 70 U/L (ref 38–126)
Anion gap: 9 (ref 5–15)
BUN: 20 mg/dL (ref 8–23)
CO2: 24 mmol/L (ref 22–32)
Calcium: 9 mg/dL (ref 8.9–10.3)
Chloride: 101 mmol/L (ref 98–111)
Creatinine, Ser: 0.86 mg/dL (ref 0.44–1.00)
GFR calc Af Amer: >60 mL/min (ref >60)
GFR calc non Af Amer: >60 mL/min (ref >60)
Glucose, Bld: 144 mg/dL — ABNORMAL HIGH (ref 70–99)
Potassium: 3.8 mmol/L (ref 3.5–5.1)
Sodium: 134 mmol/L — ABNORMAL LOW (ref 135–145)
Total Bilirubin: 0.6 mg/dL (ref 0.3–1.2)
Total Protein: 6.8 g/dL (ref 6.5–8.1)

## 2019-10-01 LAB — CBC WITH DIFFERENTIAL/PLATELET
Abs Immature Granulocytes: 0.07 10*3/uL (ref 0.00–0.07)
Basophils Absolute: 0 10*3/uL (ref 0.0–0.1)
Basophils Relative: 1 %
Eosinophils Absolute: 0.6 10*3/uL — ABNORMAL HIGH (ref 0.0–0.5)
Eosinophils Relative: 9 %
HCT: 36.2 % (ref 36.0–46.0)
Hemoglobin: 11.9 g/dL — ABNORMAL LOW (ref 12.0–15.0)
Immature Granulocytes: 1 %
Lymphocytes Relative: 18 %
Lymphs Abs: 1.2 10*3/uL (ref 0.7–4.0)
MCH: 32.2 pg (ref 26.0–34.0)
MCHC: 32.9 g/dL (ref 30.0–36.0)
MCV: 97.8 fL (ref 80.0–100.0)
Monocytes Absolute: 0.4 10*3/uL (ref 0.1–1.0)
Monocytes Relative: 6 %
Neutro Abs: 4.3 10*3/uL (ref 1.7–7.7)
Neutrophils Relative %: 65 %
Platelets: 303 10*3/uL (ref 150–400)
RBC: 3.7 MIL/uL — ABNORMAL LOW (ref 3.87–5.11)
RDW: 16.1 % — ABNORMAL HIGH (ref 11.5–15.5)
WBC: 6.6 10*3/uL (ref 4.0–10.5)
nRBC: 0 % (ref 0.0–0.2)

## 2019-10-01 MED ORDER — SODIUM CHLORIDE 0.9% FLUSH
10.0000 mL | INTRAVENOUS | Status: DC | PRN
  Administered 2019-10-01: 10 mL via INTRAVENOUS
  Filled 2019-10-01: qty 10

## 2019-10-01 MED ORDER — PACLITAXEL PROTEIN-BOUND CHEMO INJECTION 100 MG
100.0000 mg/m2 | Freq: Once | INTRAVENOUS | Status: AC
  Administered 2019-10-01: 175 mg via INTRAVENOUS
  Filled 2019-10-01: qty 35

## 2019-10-01 MED ORDER — ACETAMINOPHEN 325 MG PO TABS
650.0000 mg | ORAL_TABLET | Freq: Once | ORAL | Status: AC
  Administered 2019-10-01: 650 mg via ORAL

## 2019-10-01 MED ORDER — ONDANSETRON HCL 4 MG PO TABS
8.0000 mg | ORAL_TABLET | Freq: Once | ORAL | Status: AC
  Administered 2019-10-01: 11:00:00 8 mg via ORAL

## 2019-10-01 MED ORDER — HEPARIN SOD (PORK) LOCK FLUSH 100 UNIT/ML IV SOLN
500.0000 [IU] | Freq: Once | INTRAVENOUS | Status: AC
  Administered 2019-10-01: 500 [IU] via INTRAVENOUS
  Filled 2019-10-01: qty 5

## 2019-10-01 MED ORDER — ONDANSETRON HCL 4 MG PO TABS
ORAL_TABLET | ORAL | Status: AC
  Filled 2019-10-01: qty 2

## 2019-10-01 MED ORDER — SODIUM CHLORIDE 0.9 % IV SOLN
Freq: Once | INTRAVENOUS | Status: AC
  Filled 2019-10-01: qty 250

## 2019-10-01 NOTE — Progress Notes (Signed)
Patient here for follow up. Reports she is having diarrhea after chemo treatment "for a few days". Also reports the ringing in her ears was much better with last treatment. Reports she is also having a burning sensation on the bottom of her feet every night.

## 2019-10-03 ENCOUNTER — Encounter: Payer: Self-pay | Admitting: Hematology and Oncology

## 2019-10-09 ENCOUNTER — Telehealth: Payer: Self-pay | Admitting: *Deleted

## 2019-10-09 NOTE — Telephone Encounter (Signed)
Returned call to Toni Griffith, patient's daughter. Patient has experienced some low blood counts. Advised that [name] had recommended waiting for vaccine at this time as patient's with low wbc counts may not mount appropriate immune response to vaccination. Patient has labs scheduled for next week. We can reconsider at that time or in the future if counts staying up.

## 2019-10-09 NOTE — Telephone Encounter (Signed)
Daughter calling for clarification of wether or  Not this patient can take the COVID vaccine. She states she was told her mother should not take it, but her mother insists she was told she can take it. Please advise

## 2019-10-14 NOTE — Progress Notes (Signed)
[facility]  [address] [place], [place] [address] Phone: [phone]  Fax: [phone]   Clinic Day:  10/15/2019  Referring physician: Francesca Oman, DO  Chief Complaint: Toni Griffith is a 79 y.o. 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   HPI: The patient was last seen in the medical oncology clinic on 10/01/2019.  At that time, she felt good.  She noted some diarrhea, but was unchanged as compared to prior to chemotherapy.  She described some burning in the bottom of her feet (unchanged) as well as numbness in the fingertips of her left hand.  She received day 8 of cycle #2 Abraxane.  She is scheduled for a chest CT on 12/24/2019 (ordered by Dr Baruch Gouty).  During the interim, she has done well. She had loose bowels s/p her last treatment for 2 days.  Diarrhea has since resolved. She notes a recent fall when she was trying to get into wheelchair after breakfast.  Her wheelchair wasn't locked and it slid out from under her. She did bump her head on a cabinet, but the bruise has resolved.   She is interested in getting a COVID-19 vaccine, but needs to be aware of her blood counts before she gets one.    Past Medical History:  Diagnosis Date  . Arthritis   . Cancer [facility])    lymphoma-right hip  . Deaf   . GERD (gastroesophageal reflux disease)   . Hypertension    NOT ON MED AT PRESENT  . Squamous cell lung cancer [facility])     Past Surgical History:  Procedure Laterality Date  . CARDIAC SURGERY  1993  . CORONARY ARTERY BYPASS GRAFT     DOUBLE  . CYST REMOVAL HAND Right   . ENDOBRONCHIAL ULTRASOUND N/A 05/30/2018   Procedure: ENDOBRONCHIAL ULTRASOUND;  Surgeon: Tyler Pita, MD;  Location: [facility] ORS;  Service: Cardiopulmonary;  Laterality: N/A;  . PERIPHERAL VASCULAR CATHETERIZATION N/A 04/06/2016   Procedure:  Glori Luis Cath Insertion;  Surgeon: Algernon Huxley, MD;  Location: [facility] CV LAB;  Service: Cardiovascular;  Laterality: N/A;    Family History  Problem Relation Age of Onset  . Lung cancer Mother   . Lung cancer Father   . Diabetes Maternal Uncle   . Myasthenia gravis Maternal Grandmother   . Leukemia Maternal Grandfather     Social History:  reports that she quit smoking about 3 years ago. Her smoking use included cigarettes. She has a 80.00 pack-year smoking history. She has never used smokeless tobacco. She reports current alcohol use. She reports that she does not use drugs. She smoked 1 1/2 packs per day for 20 years (30 pack year smoking history) until 10/2015. She is hearing impaired (deaf). She lives in [place] with her boyfriend. Her daughter lives in [place]. Her daughter, [name]'s contact number is [phone].She has 2 cats named Pepper and Baby.  The patient is accompanied by the interpreter Macy Mis) and her daughter via telephone today.  Allergies: No Known Allergies  Current Medications: Current Outpatient Medications  Medication Sig Dispense Refill  . acetaminophen (TYLENOL) 500 MG tablet Take 500 mg by mouth every 6 (six) hours as needed for moderate pain.     Marland Kitchen allopurinol (ZYLOPRIM) 300 MG tablet TAKE 1 TABLET BY MOUTH EVERY DAY 90 tablet 0  . aspirin EC 81 MG  tablet Take 81 mg by mouth daily.    Marland Kitchen atorvastatin (LIPITOR) 80 MG tablet Take 80 mg by mouth every evening.     Marland Kitchen b complex vitamins tablet Take 1 tablet by mouth daily.    Carin Hock Iron (IRON CHEWS PEDIATRIC PO) Take 5 mg by mouth daily.    . Cholecalciferol (VITAMIN D3) 2000 units capsule Take 1,000 Units by mouth daily.     Marland Kitchen dextromethorphan 15 MG/5ML syrup Take 10 mLs by mouth 4 (four) times daily as needed for cough.    . DULoxetine (CYMBALTA) 60 MG capsule     . lidocaine-prilocaine (EMLA) cream Apply cream 1 hour before chemotherapy treatment and place small peive of saran wrap over  cream to protect clothing 30 g 3  . Omega-3 Fatty Acids (FISH OIL PO) Take 1 tablet by mouth daily.    . ondansetron (ZOFRAN ODT) 4 MG disintegrating tablet Take 1 tablet (4 mg total) by mouth every 8 (eight) hours as needed for nausea or vomiting. 30 tablet 1  . pantoprazole (PROTONIX) 40 MG tablet Take 40 mg by mouth daily.     . potassium chloride SA (KLOR-CON) 20 MEQ tablet Take 1 tablet (20 mEq total) by mouth daily. Take as directed by MD. 30 tablet 0  . vitamin E 400 UNIT capsule Take 400 Units by mouth daily.      No current facility-administered medications for this visit.   Facility-Administered Medications Ordered in Other Visits  Medication Dose Route Frequency Provider Last Rate Last Admin  . heparin lock flush 100 unit/mL  500 Units Intravenous Once Liberato Stansbery C, MD      . sodium chloride flush (NS) 0.9 % injection 10 mL  10 mL Intravenous PRN Nolon Stalls C, MD   10 mL at 04/05/17 1034  . sodium chloride flush (NS) 0.9 % injection 10 mL  10 mL Intravenous PRN Lequita Asal, MD   10 mL at 02/27/19 1033    Review of Systems  Constitutional: Negative.  Negative for chills, diaphoresis, fever, malaise/fatigue and weight loss (up 2 pounds).       Feels "good".  HENT: Positive for hearing loss (deaf). Negative for congestion, ear pain, nosebleeds, sinus pain, sore throat and tinnitus.        Raspy voice.  Eyes: Negative.  Negative for blurred vision, double vision and photophobia.  Respiratory: Positive for shortness of breath (on exertion; increased with mask in place). Negative for cough, hemoptysis, sputum production and wheezing.   Cardiovascular: Negative.  Negative for chest pain, palpitations, orthopnea, leg swelling and PND.  Gastrointestinal: Positive for diarrhea (2 days; now resolved). Negative for abdominal pain, blood in stool, constipation, heartburn, melena, nausea and vomiting.  Genitourinary: Negative.  Negative for dysuria, flank pain, frequency,  hematuria and urgency.  Musculoskeletal: Positive for falls (getting into wheelchair) and joint pain (knees- chronic). Negative for back pain, myalgias and neck pain.  Skin: Negative.  Negative for itching and rash.       Dry skin.  Neurological: Positive for sensory change (baseline neuropathy unchanged; left hand 2nd and 3rd digits. Neuropathy in sole of feet unchanged ). Negative for dizziness, tingling, tremors, speech change, focal weakness, seizures, weakness and headaches.  Endo/Heme/Allergies: Negative.  Does not bruise/bleed easily.  Psychiatric/Behavioral: Negative.  Negative for depression, hallucinations and memory loss. The patient is not nervous/anxious and does not have insomnia.        Sad because of hair loss.  All other systems reviewed and  are negative.  Performance Status ECOG: 1-2  Vitals Blood pressure 107/69, pulse (!) 106, temperature (!) 95.1 F (35.1 C), temperature source Tympanic, resp. rate 20, weight 178 lb (80.7 kg), SpO2 98 %.   Physical Exam  Constitutional: She is oriented to person, place, and time. Vital signs are normal. She appears well-developed and well-nourished. No distress.  Patient examined in wheelchair secondary to decreased mobility because of her knees.  HENT:  Head: Normocephalic and atraumatic.  Mouth/Throat: Oropharynx is clear and moist. No oropharyngeal exudate.  Blue head wrap.  Mask.  Eyes: Pupils are equal, round, and reactive to light. Conjunctivae and EOM are normal. No scleral icterus.  Hazel eyes.  Neck: No JVD present.  Cardiovascular: Normal rate and normal heart sounds. Exam reveals no gallop.  No murmur heard. Pulmonary/Chest: Effort normal and breath sounds normal. No respiratory distress. She has no wheezes. She has no rales.  Abdominal: Bowel sounds are normal. She exhibits no distension and no mass. There is no abdominal tenderness. There is no rebound and no guarding.  Musculoskeletal:        General: No tenderness or  edema. Normal range of motion.     Cervical back: Normal range of motion and neck supple.  Lymphadenopathy:       Head (right side): No preauricular, no posterior auricular and no occipital adenopathy present.       Head (left side): No preauricular, no posterior auricular and no occipital adenopathy present.    She has no cervical adenopathy.    She has no axillary adenopathy.       Right: No inguinal and no supraclavicular adenopathy present.       Left: No inguinal and no supraclavicular adenopathy present.  Neurological: She is alert and oriented to person, place, and time.  Skin: Skin is warm and dry. No rash noted. She is not diaphoretic. No erythema. No pallor.  Psychiatric: She has a normal mood and affect. Her behavior is normal. Judgment and thought content normal.  Nursing note and vitals reviewed.   Infusion on 10/15/2019  Component Date Value Ref Range Status  . TSH 10/15/2019 2.095  0.350 - 4.500 uIU/mL Final   Comment: Performed by a 3rd Generation assay with a functional sensitivity of <=0.01 uIU/mL. Performed at [facility], [address], [place], [place] [address]   . T4, Total 10/15/2019 7.3  4.5 - 12.0 ug/dL Final   Comment: (NOTE) Performed At: [facility] [place], [place] [id number] Rush Farmer MD YE:[id number]   . Sodium 10/15/2019 133* 135 - 145 mmol/L Final  . Potassium 10/15/2019 2.9* 3.5 - 5.1 mmol/L Final  . Chloride 10/15/2019 102  98 - 111 mmol/L Final  . CO2 10/15/2019 23  22 - 32 mmol/L Final  . Glucose, Bld 10/15/2019 141* 70 - 99 mg/dL Final   Glucose reference range applies only to samples taken after fasting for at least 8 hours.  . BUN 10/15/2019 19  8 - 23 mg/dL Final  . Creatinine, Ser 10/15/2019 0.87  0.44 - 1.00 mg/dL Final  . Calcium 10/15/2019 8.7* 8.9 - 10.3 mg/dL Final  . Total Protein 10/15/2019 6.8  6.5 - 8.1 g/dL Final  . Albumin 10/15/2019 3.1* 3.5 - 5.0 g/dL Final  . AST 10/15/2019 21  15 -  41 U/L Final  . ALT 10/15/2019 13  0 - 44 U/L Final  . Alkaline Phosphatase 10/15/2019 68  38 - 126 U/L Final  . Total Bilirubin 10/15/2019  0.7  0.3 - 1.2 mg/dL Final  . GFR calc non Af Amer 10/15/2019 >60  >60 mL/min Final  . GFR calc Af Amer 10/15/2019 >60  >60 mL/min Final  . Anion gap 10/15/2019 8  5 - 15 Final   Performed at [facility], [address], [place], [place] [address]  . WBC 10/15/2019 5.5  4.0 - 10.5 K/uL Final  . RBC 10/15/2019 3.49* 3.87 - 5.11 MIL/uL Final  . Hemoglobin 10/15/2019 11.3* 12.0 - 15.0 g/dL Final  . HCT 10/15/2019 34.2* 36.0 - 46.0 % Final  . MCV 10/15/2019 98.0  80.0 - 100.0 fL Final  . MCH 10/15/2019 32.4  26.0 - 34.0 pg Final  . MCHC 10/15/2019 33.0  30.0 - 36.0 g/dL Final  . RDW 10/15/2019 17.2* 11.5 - 15.5 % Final  . Platelets 10/15/2019 267  150 - 400 K/uL Final  . nRBC 10/15/2019 0.0  0.0 - 0.2 % Final  . Neutrophils Relative % 10/15/2019 55  % Final  . Neutro Abs 10/15/2019 3.0  1.7 - 7.7 K/uL Final  . Lymphocytes Relative 10/15/2019 20  % Final  . Lymphs Abs 10/15/2019 1.1  0.7 - 4.0 K/uL Final  . Monocytes Relative 10/15/2019 19  % Final  . Monocytes Absolute 10/15/2019 1.0  0.1 - 1.0 K/uL Final  . Eosinophils Relative 10/15/2019 4  % Final  . Eosinophils Absolute 10/15/2019 0.2  0.0 - 0.5 K/uL Final  . Basophils Relative 10/15/2019 1  % Final  . Basophils Absolute 10/15/2019 0.1  0.0 - 0.1 K/uL Final  . Immature Granulocytes 10/15/2019 1  % Final  . Abs Immature Granulocytes 10/15/2019 0.06  0.00 - 0.07 K/uL Final   Performed at [facility], [address] [place], [place] [address]  . Uric Acid, Serum 10/15/2019 3.6  2.5 - 7.1 mg/dL Final   Performed at [facility], [address] [place], [place] [address]  . LDH 10/15/2019 137  98 - 192 U/L Final   Performed at [facility], [address], [place], [place] [address]  . Potassium 10/15/2019 3.1* 3.5 - 5.1 mmol/L Final    Performed at [facility], [address], [place], [place] [address]  . Magnesium 10/15/2019 1.5* 1.7 - 2.4 mg/dL Final   Performed at [facility], [address] [place], [place] [address]    Assessment:  Toni Griffith is a 79 y.o. female withstage IVB B-cell lymphoma, unclassifiable, with features intermediate between diffuse large B-cell lymphoma and classical Hodgkin's lymphoma ("gray zone" lymphoma). She underwent right iliac wing bone/bone marrow biopsy on 03/08/2016.  PET scanon 03/03/2016 revealed a hypermetabolic left lower lobe mass, bilateral pleural and pulmonary parenchymal nodular metastasis, a large pleural lesion invading the anterior left chest wall. There was activity in the superior left ocular orbit. There was a large lesion centered in the right iliac wing with soft tissue extension. There were hypermetabolic right external iliac lymph nodes  [place]oon 04/04/2016 revealed an EF of 55-60%. Hepatitis B and C testing was negative on 03/30/2016. G6PD assay was normal. She declined LP with MTX prophylaxis.  She received 6 cycles of mini-RCHOP(04/07/2016 - 07/21/2016). She has had some transient fingertip numbness.  She received radiationto the right iliac wing, from 10/11/2016 - 11/01/2016. She received 3000 cGy over 3 weeks.  PET scanon 01/29/2017 revealed interval progression of hypermetabolic nodules in the right upper (13 mm compared to 9 mm; SUV 10) and  left lower lobes (26 mm compared to 22 mm; SUV 7.5). There was new hypermetabolic focus of activity along the anterior left pleura although no underlying pleural or lung mass was evident.  She has squamous cell lung cancer. Foundation Oneon the lung biopsy on 03/15/2018 revealed MS-stable, tumor mutational burden 5 Muts/Mb, AKT2 amplification, CCNE1 amplification, CREBBP R1446L, KDR amplification, KIT amplification, PDGFRA amplification, PTEN loss exons 2-4, and XQ11  splice site [id number]. Marland Kitchen There were no alterations in EGFR, KRAS, RET, ALK, MET, ERBB2, BRAF, ROS1. PDL-1IHC analysis revealed a TPS score of 50%.  CT guided left lower lobe nodule biopsyon 02/19/2017 confirmed squamous cell carcinoma. She underwent SBRT08/27/2018 - 04/09/2017. She received 5000 cGy in 5 fractions to the left lower lobe lesion. She underwent SBRTto a RUL lesion from 05/23/2017 - 06/18/2017.  CT guided left upper lobe nodule biopsyon 02/20/2018 revealedsquamous cell lung cancer. She received SBRTfrom 04/09/2018 - 04/15/2018. She received 6000 cGy in 5 fractions.   Bronchoscopyon 05/30/2018 revealed no endobronchial lesions. The 2 cm pre-carinal lymph node was biopsied. Pathology revealed metastatic non-small cell carcinoma, favor squamous cell carcinoma.  CT guided right paraspinal lesion biopsyon 10/16/2017 was compatible with involvement by the patient's previously diagnosed lymphoma. The case was [facility] to [facility] hematopathology. The neoplastic cells were largely CD30 negative. Repeat CD30 immunohistochemistry faintly stained >10% of the large atypical cells. CD20 stained the scattered large atypical cells and few small cells.   She received 3 cycles of brentuximab vedotin(11/23/2017 - 01/11/2018).Her lymphoma progressed.  She is s/p14cycles of pembrolizumab(06/14/2018 -05/21/2019). She received a steroid Dosepak on 01/09/2019 for minor chest CT changes felt secondary to pembrolizumab.  She is s/p 2 cycles of pembolizumab, carboplatin and Abraxane (08/29/2019 - 09/24/2019).  She experienced tinnitus after her first day of chemotherapy.  Tinnitus was minor with cycle #2.  Chest, abdomen, and pelvis CTon 06/06/2019 revealed an interval mass-like opacity in the superior aspect of the left parahilar lungwith an interval more prominent mass-like component involving the pleura laterally at the level with slightly more prominent extension into the  left hila region, suspicious for recurrent or progressive lung cancer. There was mediastinal and bilateral hilar adenopathyc/wmetastatic adenopathy or involvement with the patient's B-cell lymphoma. There was no evidence of metastatic disease or lymphoma involving the abdomen or pelvis. There was significantly improved ground-glass opacities in the right lung, minimal left pleural fluid(improved), andastable large hiatal hernia.There was stable enlarged thyroid gland containing heterogeneous nodules with substernal extension on the left.There was stable to slightly decreased size of the previously demonstrated right adrenal nodule and no significant change in a small, poorly visualized left adrenal nodule.  PET scanon 06/17/2019 revealed a known left parahilar lung mass markedly hypermetabolic with associated hypermetabolic lymphadenopathy in both hilar regions, the mediastinum, andthe right thoracic inlet. There was hypermetabolism in the right submandibular gland, indeterminate. There was no hypermetabolic metastatic disease evident in the abdomen or pelvis. There was a stable bilateral adrenal adenomas since 01/29/2018. There were tiny left pleural effusion and a large hiatal hernia.There was a segment of transverse colon extendingup into the hiatal hernia without obstructive features.   Shereceived4000cGyto the left hilar regionfrom12/14/2020 -08/15/2019.  She hasiron deficiency anemia. Ferritinhas been followed: 14 on 05/06/2018,23 on 07/26/2018, and 59 on 11/01/2018. She denies any bleeding.  She has a grade I neuropathy. B12 and folate were normal on 12/06/2017. She has severe bilateral knee arthritis. She has received steroid injections and Monovisc (hyaluronate derivatives) without improvement.   She is deafand requires an interpreter.  Symptomatically, she is doing well.  She notes transient diarrhea (2 days) after treatment.  Neuropathy is stable.  Potassium  is 2.9.  Plan: 1.   Labs today:  CBC with diff, CMP, Mg, TSH, LDH, uric acid. 2.Gray zone lymphoma Clinically,she is doing well. Exam reveals no adenopathy or hepatosplenomegaly. She has received 6 cycles of mini-RCOP, 3 cycles ofbrentuximab vedotin, and 16 cycles of pembrolizumab. PET scan on 06/17/2019 revealed progressive disease in the chest felt secondary toprogressive lung cancer. She is s/p 2 additional cycle of maintenance pembrolizumab (last 09/24/2019). Labs reviewed.  Cycle #3 maintenance pembrolizumab. 3. Clinical stage T4N3Mx (stage IIIC) squamous cell lung cancer Clinically, she appears to be doing well.  Cough s/p radiation has resolved       Patient is s/p SBRT to left upper lobe and left lower lobe nodules. Patient is s/p 15 cycles of pembrolizumab. Patient is s/p palliative radiation to the right hilar region (completed 08/15/2019). She is s/p 2 cycles of carboplatin and Abraxane (08/29/2019 - 09/24/2019). Abraxane 100 mg/m2 IV on days 1,8, and 15. Carboplatin AUC 5 on day 1. Cycle length every 21 days. Pembrolizumab incorporated for maintenance of gray zone lymphoma on day 1 She experienced significant tinnitus with cycle #1 and minimal tinnitus with cycle #2.       Reviewed labs.  Day 1 of cycle #3 carboplatinum and Abraxane.       Patient scheduled for follow-up chest CT on 12/24/2019 by radiation oncology.   Consider follow-up scans earlier (after 3-4 cycles chemotherapy)- contact Dr Baruch Gouty.  Discuss symptom management.  She has antiemetics at home to use on a prn bases.  Interventions are adequate.     4.Chemotherapy-induced neuropathy Per patient's report, her neuropathy is unchanged with chemotherapy. Continue to monitor closely with Abraxane 5. B12 deficiency She is on oral B12. B12 and folate were normal on 12/17/2018. Check levels  annually. 6.Bilateral knee pain She has baseline arthritis which may be exacerbated by pembrolizumab. She declined steroid injections. She receives Tylenol on chemotherapy days for her knee pain.             Monitor closely while on pembrolizumab. 7.   Hypokalemia  Potassium 2.9 (repeat 3.1).  Potassium 20 meq IV today.  Rx: potassium 20 meq po q day (1st dose tonight). 8.   Hypomagnesemia  Magnesium 1.5.  Magnesium sulfate2 gm IV today.  Avoid oral magnesium secondary to patient's intermittent diarrhea. 9.   Cycle #3 pembrolizumab, carboplatin, Abraxane today. 10.   RTC in 1 week for MD assessment, labs (CBC with diff, CMP, Mg), and day 8 Abraxane.  I discussed the assessment and treatment plan with the patient.  The patient was provided an opportunity to ask questions and all were answered.  The patient agreed with the plan and demonstrated an understanding of the instructions.  The patient was advised to call back if the symptoms worsen or if the condition fails to improve as anticipated.   Lequita Asal, MD, PhD    [phone], 4:16 PM   I, Jacqualyn Posey, am acting as a Education administrator for [employer]. Mike Gip, MD.   I, Avyanna Spada C. Mike Gip, MD, have reviewed the above documentation for accuracy and completeness, and I agree with the above.

## 2019-10-15 ENCOUNTER — Other Ambulatory Visit: Payer: Self-pay

## 2019-10-15 ENCOUNTER — Inpatient Hospital Stay: Payer: Medicare HMO

## 2019-10-15 ENCOUNTER — Inpatient Hospital Stay (HOSPITAL_BASED_OUTPATIENT_CLINIC_OR_DEPARTMENT_OTHER): Payer: Medicare HMO | Admitting: Hematology and Oncology

## 2019-10-15 ENCOUNTER — Encounter: Payer: Self-pay | Admitting: Hematology and Oncology

## 2019-10-15 VITALS — BP 107/69 | HR 106 | Temp 95.1°F | Resp 20 | Wt 178.0 lb

## 2019-10-15 VITALS — BP 148/83 | HR 103 | Temp 98.4°F | Resp 20

## 2019-10-15 DIAGNOSIS — Z5112 Encounter for antineoplastic immunotherapy: Secondary | ICD-10-CM

## 2019-10-15 DIAGNOSIS — Z5111 Encounter for antineoplastic chemotherapy: Secondary | ICD-10-CM

## 2019-10-15 DIAGNOSIS — E538 Deficiency of other specified B group vitamins: Secondary | ICD-10-CM | POA: Diagnosis not present

## 2019-10-15 DIAGNOSIS — G620 Drug-induced polyneuropathy: Secondary | ICD-10-CM

## 2019-10-15 DIAGNOSIS — C8518 Unspecified B-cell lymphoma, lymph nodes of multiple sites: Secondary | ICD-10-CM

## 2019-10-15 DIAGNOSIS — E876 Hypokalemia: Secondary | ICD-10-CM

## 2019-10-15 DIAGNOSIS — C3490 Malignant neoplasm of unspecified part of unspecified bronchus or lung: Secondary | ICD-10-CM | POA: Diagnosis not present

## 2019-10-15 DIAGNOSIS — C7951 Secondary malignant neoplasm of bone: Secondary | ICD-10-CM

## 2019-10-15 DIAGNOSIS — G8929 Other chronic pain: Secondary | ICD-10-CM

## 2019-10-15 DIAGNOSIS — T451X5A Adverse effect of antineoplastic and immunosuppressive drugs, initial encounter: Secondary | ICD-10-CM

## 2019-10-15 DIAGNOSIS — C8590 Non-Hodgkin lymphoma, unspecified, unspecified site: Secondary | ICD-10-CM

## 2019-10-15 LAB — COMPREHENSIVE METABOLIC PANEL
ALT: 13 U/L (ref 0–44)
AST: 21 U/L (ref 15–41)
Albumin: 3.1 g/dL — ABNORMAL LOW (ref 3.5–5.0)
Alkaline Phosphatase: 68 U/L (ref 38–126)
Anion gap: 8 (ref 5–15)
BUN: 19 mg/dL (ref 8–23)
CO2: 23 mmol/L (ref 22–32)
Calcium: 8.7 mg/dL — ABNORMAL LOW (ref 8.9–10.3)
Chloride: 102 mmol/L (ref 98–111)
Creatinine, Ser: 0.87 mg/dL (ref 0.44–1.00)
GFR calc Af Amer: >60 mL/min (ref >60)
GFR calc non Af Amer: >60 mL/min (ref >60)
Glucose, Bld: 141 mg/dL — ABNORMAL HIGH (ref 70–99)
Potassium: 2.9 mmol/L — ABNORMAL LOW (ref 3.5–5.1)
Sodium: 133 mmol/L — ABNORMAL LOW (ref 135–145)
Total Bilirubin: 0.7 mg/dL (ref 0.3–1.2)
Total Protein: 6.8 g/dL (ref 6.5–8.1)

## 2019-10-15 LAB — CBC WITH DIFFERENTIAL/PLATELET
Abs Immature Granulocytes: 0.06 10*3/uL (ref 0.00–0.07)
Basophils Absolute: 0.1 10*3/uL (ref 0.0–0.1)
Basophils Relative: 1 %
Eosinophils Absolute: 0.2 10*3/uL (ref 0.0–0.5)
Eosinophils Relative: 4 %
HCT: 34.2 % — ABNORMAL LOW (ref 36.0–46.0)
Hemoglobin: 11.3 g/dL — ABNORMAL LOW (ref 12.0–15.0)
Immature Granulocytes: 1 %
Lymphocytes Relative: 20 %
Lymphs Abs: 1.1 10*3/uL (ref 0.7–4.0)
MCH: 32.4 pg (ref 26.0–34.0)
MCHC: 33 g/dL (ref 30.0–36.0)
MCV: 98 fL (ref 80.0–100.0)
Monocytes Absolute: 1 10*3/uL (ref 0.1–1.0)
Monocytes Relative: 19 %
Neutro Abs: 3 10*3/uL (ref 1.7–7.7)
Neutrophils Relative %: 55 %
Platelets: 267 10*3/uL (ref 150–400)
RBC: 3.49 MIL/uL — ABNORMAL LOW (ref 3.87–5.11)
RDW: 17.2 % — ABNORMAL HIGH (ref 11.5–15.5)
WBC: 5.5 10*3/uL (ref 4.0–10.5)
nRBC: 0 % (ref 0.0–0.2)

## 2019-10-15 LAB — POTASSIUM: Potassium: 3.1 mmol/L — ABNORMAL LOW (ref 3.5–5.1)

## 2019-10-15 LAB — MAGNESIUM: Magnesium: 1.5 mg/dL — ABNORMAL LOW (ref 1.7–2.4)

## 2019-10-15 LAB — LACTATE DEHYDROGENASE: LDH: 137 U/L (ref 98–192)

## 2019-10-15 LAB — TSH: TSH: 2.095 u[IU]/mL (ref 0.350–4.500)

## 2019-10-15 LAB — URIC ACID: Uric Acid, Serum: 3.6 mg/dL (ref 2.5–7.1)

## 2019-10-15 MED ORDER — HEPARIN SOD (PORK) LOCK FLUSH 100 UNIT/ML IV SOLN
500.0000 [IU] | Freq: Once | INTRAVENOUS | Status: AC | PRN
  Administered 2019-10-15: 18:00:00 500 [IU]
  Filled 2019-10-15: qty 5

## 2019-10-15 MED ORDER — POTASSIUM CHLORIDE CRYS ER 20 MEQ PO TBCR: 20.0000 meq | EXTENDED_RELEASE_TABLET | Freq: Once | ORAL | 0 refills | Status: DC

## 2019-10-15 MED ORDER — POTASSIUM CHLORIDE 20 MEQ/100ML IV SOLN
20.0000 meq | Freq: Once | INTRAVENOUS | Status: AC
  Administered 2019-10-15: 20 meq via INTRAVENOUS

## 2019-10-15 MED ORDER — HEPARIN SOD (PORK) LOCK FLUSH 100 UNIT/ML IV SOLN
INTRAVENOUS | Status: AC
  Filled 2019-10-15: qty 5

## 2019-10-15 MED ORDER — SODIUM CHLORIDE 0.9 % IV SOLN
10.0000 mg | Freq: Once | INTRAVENOUS | Status: AC
  Administered 2019-10-15: 10 mg via INTRAVENOUS
  Filled 2019-10-15: qty 10

## 2019-10-15 MED ORDER — SODIUM CHLORIDE 0.9 % IV SOLN
Freq: Once | INTRAVENOUS | Status: AC
  Filled 2019-10-15: qty 250

## 2019-10-15 MED ORDER — PALONOSETRON HCL INJECTION 0.25 MG/5ML
0.2500 mg | Freq: Once | INTRAVENOUS | Status: AC
  Administered 2019-10-15: 0.25 mg via INTRAVENOUS
  Filled 2019-10-15: qty 5

## 2019-10-15 MED ORDER — SODIUM CHLORIDE 0.9 % IV SOLN
200.0000 mg | Freq: Once | INTRAVENOUS | Status: AC
  Administered 2019-10-15: 200 mg via INTRAVENOUS
  Filled 2019-10-15: qty 8

## 2019-10-15 MED ORDER — SODIUM CHLORIDE 0.9 % IV SOLN: Freq: Once | INTRAVENOUS | Status: DC

## 2019-10-15 MED ORDER — SODIUM CHLORIDE 0.9% FLUSH
10.0000 mL | INTRAVENOUS | Status: DC | PRN
  Administered 2019-10-15: 10 mL
  Filled 2019-10-15: qty 10

## 2019-10-15 MED ORDER — PACLITAXEL PROTEIN-BOUND CHEMO INJECTION 100 MG
100.0000 mg/m2 | Freq: Once | INTRAVENOUS | Status: AC
  Administered 2019-10-15: 175 mg via INTRAVENOUS
  Filled 2019-10-15: qty 35

## 2019-10-15 MED ORDER — ACETAMINOPHEN 325 MG PO TABS
650.0000 mg | ORAL_TABLET | Freq: Once | ORAL | Status: AC
  Administered 2019-10-15: 650 mg via ORAL

## 2019-10-15 MED ORDER — MAGNESIUM SULFATE 2 GM/50ML IV SOLN
2.0000 g | Freq: Once | INTRAVENOUS | Status: AC
  Administered 2019-10-15: 2 g via INTRAVENOUS
  Filled 2019-10-15: qty 50

## 2019-10-15 MED ORDER — POTASSIUM CHLORIDE CRYS ER 20 MEQ PO TBCR: 20.0000 meq | EXTENDED_RELEASE_TABLET | Freq: Every day | ORAL | 0 refills | Status: DC

## 2019-10-15 MED ORDER — SODIUM CHLORIDE 0.9 % IV SOLN
150.0000 mg | Freq: Once | INTRAVENOUS | Status: AC
  Administered 2019-10-15: 150 mg via INTRAVENOUS
  Filled 2019-10-15: qty 5

## 2019-10-15 MED ORDER — SODIUM CHLORIDE 0.9 % IV SOLN
420.0000 mg | Freq: Once | INTRAVENOUS | Status: AC
  Administered 2019-10-15: 420 mg via INTRAVENOUS
  Filled 2019-10-15: qty 42

## 2019-10-15 NOTE — Progress Notes (Signed)
Patient fell from wheelchair last Monday. She states that her knees are still sore. She had to crawl to the living room to get her phone to call for help. She also bumped her head but the bruise has resolved. She states the she also has a little blood occasionally when she blows her nose. Her knee pain is keeping her awake a night and making her appetite decrease.

## 2019-10-15 NOTE — Patient Instructions (Signed)
Spoke with Daughter/POA [name]; instructed her to pick up Potassium prescription from [facility] in [place] this afternoon. She is to give her mother 1 pill tonight. Beginning tomorrow she is to take 1 daily until notified by our office to stop.

## 2019-10-16 LAB — T4: T4, Total: 7.3 ug/dL (ref 4.5–12.0)

## 2019-10-17 ENCOUNTER — Other Ambulatory Visit: Payer: Self-pay | Admitting: Hematology and Oncology

## 2019-10-17 DIAGNOSIS — E876 Hypokalemia: Secondary | ICD-10-CM

## 2019-10-17 DIAGNOSIS — R911 Solitary pulmonary nodule: Secondary | ICD-10-CM

## 2019-10-17 DIAGNOSIS — C8198 Hodgkin lymphoma, unspecified, lymph nodes of multiple sites: Secondary | ICD-10-CM

## 2019-10-17 DIAGNOSIS — C8330 Diffuse large B-cell lymphoma, unspecified site: Secondary | ICD-10-CM

## 2019-10-17 DIAGNOSIS — C7951 Secondary malignant neoplasm of bone: Secondary | ICD-10-CM

## 2019-10-21 NOTE — Progress Notes (Signed)
[facility]  [address] [place], [place] [address] Phone: [phone]  Fax: [phone]   Clinic Day:  10/22/2019  Referring physician: Francesca Oman, DO  Chief Complaint: Toni Griffith is a 79 y.o. 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   HPI: The patient was last seen in the medical oncology clinic on 10/15/2019. At that time, she was doing well.  She noted transient diarrhea (2 days) after treatment.  Neuropathy was stable.  Labs revealed a hematocrit 34.2, hemoglobin 11.3, MCV 98.0, Platelets 267, WBC 5,500 (ANC 3,000).   LDH and uric acid were normal.  Potassium was 2.9 (3.1 on repeat).  She received day 1 of cycle #3 carboplatin, Abraxane and pembrolizumab.  She received potassium 20 meq IV.  She began oral potassium 20 meq q day.  She is scheduled for a chest CT on 12/24/2019 (ordered by Dr Baruch Gouty).  During the interim, she has felt fine since her last visit. She was expecting to be sick, but has since felt fine. She describes one episode of loose stool.  She has been feeling cold. On 10/18/2019, her hands felt "weird" in three fingers (index, ring, thumb) when they were cold. Since then, she says she feels better. She complains of shortness of breath when wearing a mask and when moving around. Her daughter states that when she exerts herself she is quick to get out of breath. Her knees are still at the same level of pain since her last visit. She denies any falls. Her daughter states that she has been having difficulty sleeping and noticed that she often sleeps better after treatments. She asked about OTC melatonin.    Past Medical History:  Diagnosis Date  . Arthritis   . Cancer [facility])    lymphoma-right hip  . Deaf   . GERD (gastroesophageal reflux disease)   . Hypertension    NOT ON MED AT PRESENT  . Squamous  cell lung cancer [facility])     Past Surgical History:  Procedure Laterality Date  . CARDIAC SURGERY  1993  . CORONARY ARTERY BYPASS GRAFT     DOUBLE  . CYST REMOVAL HAND Right   . ENDOBRONCHIAL ULTRASOUND N/A 05/30/2018   Procedure: ENDOBRONCHIAL ULTRASOUND;  Surgeon: Tyler Pita, MD;  Location: [facility] ORS;  Service: Cardiopulmonary;  Laterality: N/A;  . PERIPHERAL VASCULAR CATHETERIZATION N/A 04/06/2016   Procedure: Glori Luis Cath Insertion;  Surgeon: Algernon Huxley, MD;  Location: [facility];  Service: Cardiovascular;  Laterality: N/A;    Family History  Problem Relation Age of Onset  . Lung cancer Mother   . Lung cancer Father   . Diabetes Maternal Uncle   . Myasthenia gravis Maternal Grandmother   . Leukemia Maternal Grandfather     Social History:  reports that she quit smoking about 3 years ago. Her smoking use included cigarettes. She has a 80.00 pack-year smoking history. She has never used smokeless tobacco. She reports current alcohol use. She reports that she does not use drugs. She smoked 1 1/2 packs per day for 20 years (30 pack year smoking history) until 10/2015. She is hearing impaired (deaf). She lives in [place] with her boyfriend. Her daughter lives in [place]. Her daughter, [name]'s contact number is [phone].She has 2 cats named Pepper and Baby. The patient is accompanied by Interpreter ASL today.  Allergies: No Known Allergies  Current Medications: Current Outpatient Medications  Medication Sig Dispense Refill  . acetaminophen (TYLENOL) 500 MG tablet Take 500 mg by mouth every 6 (six) hours as needed for moderate pain.     Marland Kitchen allopurinol (ZYLOPRIM) 300 MG tablet TAKE 1 TABLET BY MOUTH EVERY DAY 90 tablet 0  . aspirin EC 81 MG tablet Take 81 mg by mouth daily.    Marland Kitchen atorvastatin (LIPITOR) 80 MG tablet Take 80 mg by mouth every evening.     Marland Kitchen b complex vitamins tablet Take 1 tablet by mouth daily.    Carin Hock Iron (IRON CHEWS PEDIATRIC PO) Take 5 mg  by mouth daily.    . Cholecalciferol (VITAMIN D3) 2000 units capsule Take 1,000 Units by mouth daily.     Marland Kitchen dextromethorphan 15 MG/5ML syrup Take 10 mLs by mouth 4 (four) times daily as needed for cough.    . DULoxetine (CYMBALTA) 60 MG capsule     . lidocaine-prilocaine (EMLA) cream Apply cream 1 hour before chemotherapy treatment and place small peive of saran wrap over cream to protect clothing 30 g 3  . Omega-3 Fatty Acids (FISH OIL PO) Take 1 tablet by mouth daily.    . ondansetron (ZOFRAN ODT) 4 MG disintegrating tablet Take 1 tablet (4 mg total) by mouth every 8 (eight) hours as needed for nausea or vomiting. 30 tablet 1  . pantoprazole (PROTONIX) 40 MG tablet Take 40 mg by mouth daily.     . potassium chloride SA (KLOR-CON) 20 MEQ tablet Take 1 tablet (20 mEq total) by mouth daily. Take as directed by MD. 30 tablet 0  . vitamin E 400 UNIT capsule Take 400 Units by mouth daily.      No current facility-administered medications for this visit.   Facility-Administered Medications Ordered in Other Visits  Medication Dose Route Frequency Provider Last Rate Last Admin  . heparin lock flush 100 unit/mL  500 Units Intravenous Once ,  C, MD      . heparin lock flush 100 unit/mL  500 Units Intravenous Once ,  C, MD      . sodium chloride flush (NS) 0.9 % injection 10 mL  10 mL Intravenous PRN Nolon Stalls C, MD   10 mL at 04/05/17 1034  . sodium chloride flush (NS) 0.9 % injection 10 mL  10 mL Intravenous PRN Lequita Asal, MD   10 mL at 02/27/19 1033  . sodium chloride flush (NS) 0.9 % injection 10 mL  10 mL Intravenous PRN Lequita Asal, MD   10 mL at 10/22/19 0955    Review of Systems  Constitutional: Negative.  Negative for chills, diaphoresis, fever, malaise/fatigue and weight loss (up 2 pounds).       Feels "fine".  HENT: Positive for hearing loss (deaf). Negative for congestion, ear pain, nosebleeds, sinus pain, sore throat and tinnitus.    Eyes: Negative.  Negative for blurred vision, double vision and photophobia.  Respiratory: Positive for shortness of breath (on exertion; increased with mask in place). Negative for cough, hemoptysis, sputum production and wheezing.   Cardiovascular: Negative.  Negative for chest pain, palpitations, orthopnea, leg swelling and PND.  Gastrointestinal: Positive for diarrhea (2 days; now resolved). Negative for abdominal pain, blood in stool, constipation, heartburn, melena, nausea and vomiting.  Genitourinary: Negative.  Negative for dysuria, flank pain, frequency, hematuria and urgency.  Musculoskeletal: Positive for falls (getting into wheelchair) and joint pain (knees- chronic). Negative for back pain, myalgias and  neck pain.  Skin: Negative.  Negative for itching and rash.       Dry skin.  Neurological: Positive for sensory change (baseline neuropathy unchanged; left hand 2nd and 3rd digits. Neuropathy in sole of feet unchanged ). Negative for dizziness, tingling, tremors, speech change, focal weakness, seizures, weakness and headaches.  Endo/Heme/Allergies: Negative.  Does not bruise/bleed easily.  Psychiatric/Behavioral: Negative.  Negative for depression, hallucinations and memory loss. The patient is not nervous/anxious and does not have insomnia.        Sad because of hair loss.  All other systems reviewed and are negative.  Performance status (ECOG): 2  Vitals Blood pressure (!) 141/62, pulse (!) 106, temperature (!) 97.3 F (36.3 C), temperature source Tympanic, resp. rate 18, height '5\' 3"'  (1.6 m), weight 179 lb (81.2 kg), SpO2 97 %.   Physical Exam  Constitutional: She is oriented to person, place, and time. Vital signs are normal. She appears well-developed and well-nourished. No distress.  Patient examined in wheelchair secondary to decreased mobility because of her knees.  HENT:  Head: Normocephalic and atraumatic.  Mouth/Throat: Oropharynx is clear and moist. No oropharyngeal  exudate.  Blue head wrap.  Mask.  Eyes: Pupils are equal, round, and reactive to light. Conjunctivae and EOM are normal. No scleral icterus.  Hazel eyes.  Neck: No JVD present.  Cardiovascular: Normal rate and normal heart sounds. Exam reveals no gallop.  No murmur heard. Pulmonary/Chest: Effort normal and breath sounds normal. No respiratory distress. She has no wheezes. She has no rales.  Abdominal: Bowel sounds are normal. She exhibits no distension and no mass. There is no abdominal tenderness. There is no rebound and no guarding.  Musculoskeletal:        General: No tenderness or edema. Normal range of motion.     Cervical back: Normal range of motion and neck supple.  Lymphadenopathy:       Head (right side): No preauricular, no posterior auricular and no occipital adenopathy present.       Head (left side): No preauricular, no posterior auricular and no occipital adenopathy present.    She has no cervical adenopathy.    She has no axillary adenopathy.       Right: No inguinal and no supraclavicular adenopathy present.       Left: No inguinal and no supraclavicular adenopathy present.  Neurological: She is alert and oriented to person, place, and time.  Skin: Skin is warm and dry. No rash noted. She is not diaphoretic. No erythema. No pallor.  Psychiatric: She has a normal mood and affect. Her behavior is normal. Judgment and thought content normal.  Nursing note and vitals reviewed.   Infusion on 10/22/2019  Component Date Value Ref Range Status  . WBC 10/22/2019 6.2  4.0 - 10.5 K/uL Final  . RBC 10/22/2019 3.31* 3.87 - 5.11 MIL/uL Final  . Hemoglobin 10/22/2019 10.8* 12.0 - 15.0 g/dL Final  . HCT 10/22/2019 33.1* 36.0 - 46.0 % Final  . MCV 10/22/2019 100.0  80.0 - 100.0 fL Final  . MCH 10/22/2019 32.6  26.0 - 34.0 pg Final  . MCHC 10/22/2019 32.6  30.0 - 36.0 g/dL Final  . RDW 10/22/2019 17.2* 11.5 - 15.5 % Final  . Platelets 10/22/2019 309  150 - 400 K/uL Final  . nRBC  10/22/2019 0.0  0.0 - 0.2 % Final  . Neutrophils Relative % 10/22/2019 64  % Final  . Neutro Abs 10/22/2019 4.0  1.7 - 7.7 K/uL Final  .  Lymphocytes Relative 10/22/2019 18  % Final  . Lymphs Abs 10/22/2019 1.1  0.7 - 4.0 K/uL Final  . Monocytes Relative 10/22/2019 10  % Final  . Monocytes Absolute 10/22/2019 0.6  0.1 - 1.0 K/uL Final  . Eosinophils Relative 10/22/2019 6  % Final  . Eosinophils Absolute 10/22/2019 0.4  0.0 - 0.5 K/uL Final  . Basophils Relative 10/22/2019 1  % Final  . Basophils Absolute 10/22/2019 0.0  0.0 - 0.1 K/uL Final  . Immature Granulocytes 10/22/2019 1  % Final  . Abs Immature Granulocytes 10/22/2019 0.07  0.00 - 0.07 K/uL Final   Performed at [facility], [address], [place], [place] [address]    Assessment:  Toni Griffith is a 79 y.o. female withstage IVB B-cell lymphoma, unclassifiable, with features intermediate between diffuse large B-cell lymphoma and classical Hodgkin's lymphoma ("gray zone" lymphoma). She underwent right iliac wing bone/bone marrow biopsy on 03/08/2016.  PET scanon 03/03/2016 revealed a hypermetabolic left lower lobe mass, bilateral pleural and pulmonary parenchymal nodular metastasis, a large pleural lesion invading the anterior left chest wall. There was activity in the superior left ocular orbit. There was a large lesion centered in the right iliac wing with soft tissue extension. There were hypermetabolic right external iliac lymph nodes  [place]oon 04/04/2016 revealed an EF of 55-60%. Hepatitis B and C testing was negative on 03/30/2016. G6PD assay was normal. She declined LP with MTX prophylaxis.  She received 6 cycles of mini-RCHOP(04/07/2016 - 07/21/2016). She has had some transient fingertip numbness.  She received radiationto the right iliac wing, from 10/11/2016 - 11/01/2016. She received 3000 cGy over 3 weeks.  PET scanon 01/29/2017 revealed interval progression of hypermetabolic  nodules in the right upper (13 mm compared to 9 mm; SUV 10) and left lower lobes (26 mm compared to 22 mm; SUV 7.5). There was new hypermetabolic focus of activity along the anterior left pleura although no underlying pleural or lung mass was evident.  She has squamous cell lung cancer. Foundation Oneon the lung biopsy on 03/15/2018 revealed MS-stable, tumor mutational burden 5 Muts/Mb, AKT2 amplification, CCNE1 amplification, CREBBP R1446L, KDR amplification, KIT amplification, PDGFRA amplification, PTEN loss exons 2-4, and UX83 splice site 338+3A>N. Marland Kitchen There were no alterations in EGFR, KRAS, RET, ALK, MET, ERBB2, BRAF, ROS1. PDL-1IHC analysis revealed a TPS score of 50%.  CT guided left lower lobe nodule biopsyon 02/19/2017 confirmed squamous cell carcinoma. She underwent SBRT08/27/2018 - 04/09/2017. She received 5000 cGy in 5 fractions to the left lower lobe lesion. She underwent SBRTto a RUL lesion from 05/23/2017 - 06/18/2017.  CT guided left upper lobe nodule biopsyon 02/20/2018 revealedsquamous cell lung cancer. She received SBRTfrom 04/09/2018 - 04/15/2018. She received 6000 cGy in 5 fractions.   Bronchoscopyon 05/30/2018 revealed no endobronchial lesions. The 2 cm pre-carinal lymph node was biopsied. Pathology revealed metastatic non-small cell carcinoma, favor squamous cell carcinoma.  CT guided right paraspinal lesion biopsyon 10/16/2017 was compatible with involvement by the patient's previously diagnosed lymphoma. The case was referred to [facility] hematopathology. The neoplastic cells were largely CD30 negative. Repeat CD30 immunohistochemistry faintly stained >10% of the large atypical cells. CD20 stained the scattered large atypical cells and few small cells.   She received 3 cycles of brentuximab vedotin(11/23/2017 - 01/11/2018).Her lymphoma progressed.  She is s/p14cycles of pembrolizumab(06/14/2018 -05/21/2019). She received a steroid Dosepak on  01/09/2019 for minor chest CT changes felt secondary to pembrolizumab.  She is day 8 of cycle #3 pembolizumab, carboplatin and Abraxane (  08/29/2019 - 10/15/2019).  She experienced tinnitus after her first day of chemotherapy.  Tinnitus was minor with cycle #2.  Chest, abdomen, and pelvis CTon 06/06/2019 revealed an interval mass-like opacity in the superior aspect of the left parahilar lungwith an interval more prominent mass-like component involving the pleura laterally at the level with slightly more prominent extension into the left hila region, suspicious for recurrent or progressive lung cancer. There was mediastinal and bilateral hilar adenopathyc/wmetastatic adenopathy or involvement with the patient's B-cell lymphoma. There was no evidence of metastatic disease or lymphoma involving the abdomen or pelvis. There was significantly improved ground-glass opacities in the right lung, minimal left pleural fluid(improved), andastable large hiatal hernia.There was stable enlarged thyroid gland containing heterogeneous nodules with substernal extension on the left.There was stable to slightly decreased size of the previously demonstrated right adrenal nodule and no significant change in a small, poorly visualized left adrenal nodule.  PET scanon 06/17/2019 revealed a known left parahilar lung mass markedly hypermetabolic with associated hypermetabolic lymphadenopathy in both hilar regions, the mediastinum, andthe right thoracic inlet. There was hypermetabolism in the right submandibular gland, indeterminate. There was no hypermetabolic metastatic disease evident in the abdomen or pelvis. There was a stable bilateral adrenal adenomas since 01/29/2018. There were tiny left pleural effusion and a large hiatal hernia.There was a segment of transverse colon extendingup into the hiatal hernia without obstructive features.   Shereceived4000cGyto the left hilar regionfrom12/14/2020  -08/15/2019.  She hasiron deficiency anemia. Ferritinhas been followed: 14 on 05/06/2018,23 on 07/26/2018, and 59 on 11/01/2018. She denies any bleeding.  She has a grade I neuropathy. B12 and folate were normal on 12/06/2017. She has severe bilateral knee arthritis. She has received steroid injections and Monovisc (hyaluronate derivatives) without improvement.   She is deafand requires an interpreter.  Symptomatically, she is doing well.  She notes 1 loose stool during the interim.  She has chronic knee pain.  She has shortness of breath secondary to wearing a mask.  Exam is stable.  Plan: 1.   Labs today:  CBC with diff, CMP, Mg. 2.Gray zone lymphoma Clinically,she continue to do well. Exam reveals no adenopathy or hepatosplenomegaly. She has received 6 cycles of mini-RCOP, 3 cycles ofbrentuximab vedotin, and 16 cycles of pembrolizumab. PET scan on 06/17/2019 revealed progressive disease in the chest felt secondary toprogressive lung cancer. She is s/p 2 additional cycle of maintenance pembrolizumab (last 09/24/2019). Patient is currently day 8 of cycle #3 pembrolizumab, carboplatin and Abraxane. Interval diarrhea transient and thus not felt related to pembrolizumab.  Continue to monitor.  Review plans for imaging following 4 cycles of treatment to assess disease response. 3. Clinical stage T4N3Mx (stage IIIC) squamous cell lung cancer Clinically, she appears to be doing well.       Patient is s/p SBRT to left upper lobe and left lower lobe nodules. Patient is s/p 15 cycles of pembrolizumab. Patient is s/p palliative radiation to the right hilar region (completed 08/15/2019). She is day 8 of cycle #3 carboplatin and Abraxane (08/29/2019 - 10/15/2019). Abraxane 100 mg/m2 IV on days 1 and 8. Carboplatin AUC 5 on day 1. Cycle length every 21 days. Pembrolizumab  incorporated for maintenance of gray zone lymphoma on day 1.  Patient does not receive Abraxane on day 15 secondary to leukopenia noted with first cycle She experienced significant tinnitus with cycle #1 and minimal tinnitus with cycle #2.       Labs reviewed.  Day 8 of cycle #3 carboplatin and Abraxane.  Patient scheduled for follow-up chest CT on 12/24/2019 by radiation oncology.   Contact Dr. Donella Stade regarding earlier CT scans after 4 cycles  Discuss symptom management.  She  has antiemetics at home to use on a prn bases.  Interventions are adequate.    4.Chemotherapy-induced neuropathy Per patient's report, her neuropathy appears stable. Monitor closely on Abraxane. 5. B12 deficiency She remains on oral B12. B12 and folate were normal on 12/17/2018. Check levels annually. 6.Bilateral knee pain She has baseline arthritis which may be exacerbated by pembrolizumab. She declined steroid injections. She receives Tylenol on chemotherapy days for her knee pain.             Monitor closely while on pembrolizumab. 7.   Hypomagnesemia  Magnesium 1.6.  Magnesium sulfate 1 g IV today.  Currently avoiding oral magnesium secondary to patient's intermittent diarrhea. 8.   Day 8 of cycle #3 Abraxane today. 5.   MD to contact Dr Baruch Gouty about moving up CT scans. 10.   RTC in 2 weeks for MD assessment, labs (CBC with diff, CMP, Mg), and cycle #4 carboplatin, Abraxane, and pembrolizumab.  I discussed the assessment and treatment plan with the patient.  The patient was provided an opportunity to ask questions and all were answered.  The patient agreed with the plan and demonstrated an understanding of the instructions.  The patient was advised to call back if the symptoms worsen or if the condition fails to improve as anticipated.   Lequita Asal, MD, PhD    10/22/2019, 9:15 AM    I, Jacqualyn Posey, am acting as a Education administrator for [employer].  Mike Gip, MD.   I,  C. Mike Gip, MD, have reviewed the above documentation for accuracy and completeness, and I agree with the above.

## 2019-10-22 ENCOUNTER — Inpatient Hospital Stay: Payer: Medicare HMO

## 2019-10-22 ENCOUNTER — Inpatient Hospital Stay (HOSPITAL_BASED_OUTPATIENT_CLINIC_OR_DEPARTMENT_OTHER): Payer: Medicare HMO | Admitting: Hematology and Oncology

## 2019-10-22 ENCOUNTER — Other Ambulatory Visit: Payer: Self-pay

## 2019-10-22 ENCOUNTER — Encounter: Payer: Self-pay | Admitting: Hematology and Oncology

## 2019-10-22 VITALS — BP 150/88 | HR 94 | Temp 97.7°F | Resp 20

## 2019-10-22 VITALS — BP 141/62 | HR 93 | Temp 97.3°F | Resp 18 | Ht 63.0 in | Wt 179.0 lb

## 2019-10-22 DIAGNOSIS — C8518 Unspecified B-cell lymphoma, lymph nodes of multiple sites: Secondary | ICD-10-CM

## 2019-10-22 DIAGNOSIS — G8929 Other chronic pain: Secondary | ICD-10-CM

## 2019-10-22 DIAGNOSIS — Z5112 Encounter for antineoplastic immunotherapy: Secondary | ICD-10-CM | POA: Diagnosis not present

## 2019-10-22 DIAGNOSIS — M25562 Pain in left knee: Secondary | ICD-10-CM

## 2019-10-22 DIAGNOSIS — C3490 Malignant neoplasm of unspecified part of unspecified bronchus or lung: Secondary | ICD-10-CM

## 2019-10-22 DIAGNOSIS — E538 Deficiency of other specified B group vitamins: Secondary | ICD-10-CM | POA: Diagnosis not present

## 2019-10-22 DIAGNOSIS — M170 Bilateral primary osteoarthritis of knee: Secondary | ICD-10-CM

## 2019-10-22 DIAGNOSIS — Z5111 Encounter for antineoplastic chemotherapy: Secondary | ICD-10-CM

## 2019-10-22 LAB — CBC WITH DIFFERENTIAL/PLATELET
Abs Immature Granulocytes: 0.07 10*3/uL (ref 0.00–0.07)
Basophils Absolute: 0 10*3/uL (ref 0.0–0.1)
Basophils Relative: 1 %
Eosinophils Absolute: 0.4 10*3/uL (ref 0.0–0.5)
Eosinophils Relative: 6 %
HCT: 33.1 % — ABNORMAL LOW (ref 36.0–46.0)
Hemoglobin: 10.8 g/dL — ABNORMAL LOW (ref 12.0–15.0)
Immature Granulocytes: 1 %
Lymphocytes Relative: 18 %
Lymphs Abs: 1.1 10*3/uL (ref 0.7–4.0)
MCH: 32.6 pg (ref 26.0–34.0)
MCHC: 32.6 g/dL (ref 30.0–36.0)
MCV: 100 fL (ref 80.0–100.0)
Monocytes Absolute: 0.6 10*3/uL (ref 0.1–1.0)
Monocytes Relative: 10 %
Neutro Abs: 4 10*3/uL (ref 1.7–7.7)
Neutrophils Relative %: 64 %
Platelets: 309 10*3/uL (ref 150–400)
RBC: 3.31 MIL/uL — ABNORMAL LOW (ref 3.87–5.11)
RDW: 17.2 % — ABNORMAL HIGH (ref 11.5–15.5)
WBC: 6.2 10*3/uL (ref 4.0–10.5)
nRBC: 0 % (ref 0.0–0.2)

## 2019-10-22 LAB — COMPREHENSIVE METABOLIC PANEL
ALT: 18 U/L (ref 0–44)
AST: 25 U/L (ref 15–41)
Albumin: 3.2 g/dL — ABNORMAL LOW (ref 3.5–5.0)
Alkaline Phosphatase: 74 U/L (ref 38–126)
Anion gap: 7 (ref 5–15)
BUN: 13 mg/dL (ref 8–23)
CO2: 24 mmol/L (ref 22–32)
Calcium: 8.8 mg/dL — ABNORMAL LOW (ref 8.9–10.3)
Chloride: 101 mmol/L (ref 98–111)
Creatinine, Ser: 0.84 mg/dL (ref 0.44–1.00)
GFR calc Af Amer: >60 mL/min (ref >60)
GFR calc non Af Amer: >60 mL/min (ref >60)
Glucose, Bld: 115 mg/dL — ABNORMAL HIGH (ref 70–99)
Potassium: 4.4 mmol/L (ref 3.5–5.1)
Sodium: 132 mmol/L — ABNORMAL LOW (ref 135–145)
Total Bilirubin: 0.5 mg/dL (ref 0.3–1.2)
Total Protein: 6.3 g/dL — ABNORMAL LOW (ref 6.5–8.1)

## 2019-10-22 LAB — MAGNESIUM: Magnesium: 1.6 mg/dL — ABNORMAL LOW (ref 1.7–2.4)

## 2019-10-22 MED ORDER — SODIUM CHLORIDE 0.9 % IV SOLN
Freq: Once | INTRAVENOUS | Status: AC
  Filled 2019-10-22: qty 2

## 2019-10-22 MED ORDER — ONDANSETRON HCL 4 MG PO TABS
8.0000 mg | ORAL_TABLET | Freq: Once | ORAL | Status: AC
  Administered 2019-10-22: 8 mg via ORAL

## 2019-10-22 MED ORDER — HEPARIN SOD (PORK) LOCK FLUSH 100 UNIT/ML IV SOLN
INTRAVENOUS | Status: AC
  Filled 2019-10-22: qty 5

## 2019-10-22 MED ORDER — SODIUM CHLORIDE 0.9 % IV SOLN
Freq: Once | INTRAVENOUS | Status: AC
  Filled 2019-10-22: qty 250

## 2019-10-22 MED ORDER — ONDANSETRON HCL 4 MG PO TABS
ORAL_TABLET | ORAL | Status: AC
  Filled 2019-10-22: qty 2

## 2019-10-22 MED ORDER — MAGNESIUM SULFATE 50 % IJ SOLN
1.0000 g | Freq: Once | INTRAVENOUS | Status: DC
  Filled 2019-10-22: qty 2

## 2019-10-22 MED ORDER — PACLITAXEL PROTEIN-BOUND CHEMO INJECTION 100 MG
100.0000 mg/m2 | Freq: Once | INTRAVENOUS | Status: AC
  Administered 2019-10-22: 175 mg via INTRAVENOUS
  Filled 2019-10-22: qty 35

## 2019-10-22 MED ORDER — HEPARIN SOD (PORK) LOCK FLUSH 100 UNIT/ML IV SOLN
500.0000 [IU] | Freq: Once | INTRAVENOUS | Status: AC
  Administered 2019-10-22: 500 [IU] via INTRAVENOUS
  Filled 2019-10-22: qty 5

## 2019-10-22 MED ORDER — ACETAMINOPHEN 325 MG PO TABS
650.0000 mg | ORAL_TABLET | Freq: Once | ORAL | Status: AC
  Administered 2019-10-22: 650 mg via ORAL

## 2019-10-22 MED ORDER — SODIUM CHLORIDE 0.9% FLUSH
10.0000 mL | INTRAVENOUS | Status: DC | PRN
  Administered 2019-10-22: 10 mL via INTRAVENOUS
  Filled 2019-10-22: qty 10

## 2019-10-22 NOTE — Progress Notes (Signed)
The patient c/o trouble sleeping at times and right knee pain ( 6) but nothing new.p 106

## 2019-10-29 NOTE — Progress Notes (Signed)
Pharmacist Chemotherapy Monitoring - Follow Up Assessment    I verify that I have reviewed each item in the below checklist:  . Regimen for the patient is scheduled for the appropriate day and plan matches scheduled date. Marland Kitchen Appropriate non-routine labs are ordered dependent on drug ordered. . If applicable, additional medications reviewed and ordered per protocol based on lifetime cumulative doses and/or treatment regimen.   Plan for follow-up and/or issues identified: No . I-vent associated with next due treatment: No . MD and/or nursing notified: No  Toni Griffith K 10/29/2019 7:42 AM

## 2019-11-03 ENCOUNTER — Encounter: Payer: Self-pay | Admitting: Hematology and Oncology

## 2019-11-03 ENCOUNTER — Other Ambulatory Visit: Payer: Self-pay

## 2019-11-03 NOTE — Progress Notes (Signed)
No new changes noted today. The patient information was verified by the patient daughter today.

## 2019-11-03 NOTE — Progress Notes (Signed)
[facility]  [address], [address] [place], [place] [address] Phone: [phone]  Fax: [phone]   Clinic Day:  11/05/2019  Referring physician: Francesca Oman, DO  Chief Complaint: Toni Griffith is a 79 y.o. 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   HPI: The patient was last seen in the medical oncology clinic on 10/22/2019. At that time, she was doing well.  She denied noted an isolated episode of loose stool.  She describes transient "wierdness" in 3 of her fingers.  Hematocrit was 33.1, hemoglobin 10.8, MCV 100.0, platelets 309,000, WBC 6200, ANC 4000. Mg 1.6. Sodium was 132, calcium 8.8 and albumin 3.2.   Magnesium was 1.6.  She received day 8 Abraxane. She received magnesium 1 gm IV.  During the interim, she has been doing well. Her son says she had an episode on 10/25/2019. Her legs were feeling weak as she was walking to the bathroom. Her son suggested she do some squats in her recliner. He tried to get her to go to the walker but she felt extremely nervous and began shaking her head. She looked as though she was in pain so he lifted up her head up and placed his hands on her face to prevent her from shaking her head. The episode lasted for about a minute. She says it was all unexpected. Her legs felt "shaky" and it felt like her eyes were going in different directions. She felt frightened. It has only happened once. Her son started playing catch with her to see if her coordination was ok; it was normal. She seemed fine after the episode. The patient briefly remembers the episode.   Her left leg was bothering her last night, from her thigh down to her heel. She put her foot in hot water and it seemed to help. She has been complaining of her heels hurting. She describe it as something inside her shoe or something stuck to the back of her  heel. Her right leg felt fine. Her left 2nd and 3rd digits are still about the same. Her knee pain still bothers her; her left knee is at a pain level of 7. She had some tranient diarrhea since her last visit.   She is currently not taking any magnesium or potassium. She agrees to reschedule her chest CT to assess response to treatment after this cycle of chemotherapy.    Past Medical History:  Diagnosis Date  . Arthritis   . Cancer [facility])    lymphoma-right hip  . Deaf   . GERD (gastroesophageal reflux disease)   . Hypertension    NOT ON MED AT PRESENT  . Squamous cell lung cancer [facility])     Past Surgical History:  Procedure Laterality Date  . CARDIAC SURGERY  1993  . CORONARY ARTERY BYPASS GRAFT     DOUBLE  . CYST REMOVAL HAND Right   . ENDOBRONCHIAL ULTRASOUND N/A 05/30/2018   Procedure: ENDOBRONCHIAL ULTRASOUND;  Surgeon: Tyler Pita, MD;  Location: [facility] ORS;  Service: Cardiopulmonary;  Laterality: N/A;  . PERIPHERAL VASCULAR CATHETERIZATION N/A 04/06/2016   Procedure: Glori Luis Cath Insertion;  Surgeon: Algernon Huxley, MD;  Location: [facility];  Service: Cardiovascular;  Laterality: N/A;    Family History  Problem Relation Age of Onset  . Lung cancer Mother   . Lung cancer Father   . Diabetes  Maternal Uncle   . Myasthenia gravis Maternal Grandmother   . Leukemia Maternal Grandfather     Social History:  reports that she quit smoking about 3 years ago. Her smoking use included cigarettes. She has a 80.00 pack-year smoking history. She has never used smokeless tobacco. She reports current alcohol use. She reports that she does not use drugs. She smoked 1 1/2 packs per day for 20 years (30 pack year smoking history) until 10/2015. She is hearing impaired (deaf). She lives in [place] with her boyfriend. Her daughter lives in [place]. Her daughter, [name]'s contact number is [phone].She has 2 cats named Pepper and Baby. The patient is accompanied by Lucretia Roers  interpreter, and her son on the Ipad and her daughter on the phone today.  Allergies: No Known Allergies  Current Medications: Current Outpatient Medications  Medication Sig Dispense Refill  . acetaminophen (TYLENOL) 500 MG tablet Take 500 mg by mouth every 6 (six) hours as needed for moderate pain.     Marland Kitchen allopurinol (ZYLOPRIM) 300 MG tablet TAKE 1 TABLET BY MOUTH EVERY DAY 90 tablet 0  . aspirin EC 81 MG tablet Take 81 mg by mouth daily.    Marland Kitchen atorvastatin (LIPITOR) 80 MG tablet Take 80 mg by mouth every evening.     Marland Kitchen b complex vitamins tablet Take 1 tablet by mouth daily.    Carin Hock Iron (IRON CHEWS PEDIATRIC PO) Take 5 mg by mouth daily.    . Cholecalciferol (VITAMIN D3) 2000 units capsule Take 1,000 Units by mouth daily.     . DULoxetine (CYMBALTA) 60 MG capsule     . lidocaine-prilocaine (EMLA) cream Apply cream 1 hour before chemotherapy treatment and place small peive of saran wrap over cream to protect clothing 30 g 3  . Omega-3 Fatty Acids (FISH OIL PO) Take 1 tablet by mouth daily.    . ondansetron (ZOFRAN ODT) 4 MG disintegrating tablet Take 1 tablet (4 mg total) by mouth every 8 (eight) hours as needed for nausea or vomiting. 30 tablet 1  . pantoprazole (PROTONIX) 40 MG tablet Take 40 mg by mouth daily.     . potassium chloride SA (KLOR-CON) 20 MEQ tablet Take 1 tablet (20 mEq total) by mouth daily. Take as directed by MD. 30 tablet 0  . vitamin E 400 UNIT capsule Take 400 Units by mouth daily.     Marland Kitchen dextromethorphan 15 MG/5ML syrup Take 10 mLs by mouth 4 (four) times daily as needed for cough.     No current facility-administered medications for this visit.   Facility-Administered Medications Ordered in Other Visits  Medication Dose Route Frequency Provider Last Rate Last Admin  . heparin lock flush 100 unit/mL  500 Units Intravenous Once Halyn Flaugher C, MD      . heparin lock flush 100 unit/mL  500 Units Intravenous Once Doye Montilla C, MD      . sodium  chloride flush (NS) 0.9 % injection 10 mL  10 mL Intravenous PRN Nolon Stalls C, MD   10 mL at 04/05/17 1034  . sodium chloride flush (NS) 0.9 % injection 10 mL  10 mL Intravenous PRN Lequita Asal, MD   10 mL at 02/27/19 1033  . sodium chloride flush (NS) 0.9 % injection 10 mL  10 mL Intravenous PRN Lequita Asal, MD   10 mL at 11/05/19 0919    Review of Systems  Constitutional: Negative.  Negative for chills, diaphoresis, fever, malaise/fatigue and weight loss (  stable).       Feels "good".  HENT: Positive for hearing loss (deaf). Negative for congestion, ear pain, nosebleeds, sinus pain, sore throat and tinnitus.   Eyes: Negative.  Negative for blurred vision, double vision and photophobia.  Respiratory: Negative for cough and shortness of breath.   Cardiovascular: Negative.  Negative for chest pain, palpitations, orthopnea, leg swelling and PND.  Gastrointestinal: Positive for diarrhea (transient). Negative for abdominal pain, blood in stool, constipation, heartburn, melena, nausea and vomiting.  Genitourinary: Negative.  Negative for dysuria, flank pain, frequency and urgency.  Musculoskeletal: Positive for joint pain (knees-chronic). Negative for back pain, falls and myalgias.  Skin: Negative for itching and rash.       Dry skin.  Neurological: Positive for sensory change (baseline neuropathy unchanged; left hand 2nd and 3rd digits; neuropathy in bottom of feet and heel of foot). Negative for dizziness, focal weakness, weakness and headaches.  Endo/Heme/Allergies: Negative.  Does not bruise/bleed easily.  Psychiatric/Behavioral: Negative for depression and memory loss. The patient is not nervous/anxious and does not have insomnia.   All other systems reviewed and are negative.  Performance status (ECOG):  2  Vitals Blood pressure 117/71, pulse (!) 111, temperature (!) 96.6 F (35.9 C), resp. rate 18, weight 179 lb (81.2 kg), SpO2 96 %.   Physical Exam    Constitutional: She is oriented to person, place, and time. She appears well-developed and well-nourished. No distress. Face mask in place.  Patient examined in wheelchair secondary to decreased mobility of her knees.   HENT:  Head: Normocephalic and atraumatic.  Right Ear: Hearing normal.  Left Ear: Hearing normal.  Mouth/Throat: Oropharynx is clear and moist and mucous membranes are normal. No oral lesions.  Green head wrap.  Eyes: Pupils are equal, round, and reactive to light. Conjunctivae and EOM are normal.  Hazel eyes.   Neck: No JVD present.  Cardiovascular: Normal rate, regular rhythm and normal heart sounds. Exam reveals no gallop and no friction rub.  No murmur heard. Pulmonary/Chest: Effort normal and breath sounds normal. She has no wheezes. She has no rhonchi. She has no rales.  Abdominal: Soft. Normal appearance and bowel sounds are normal. She exhibits no distension and no mass. There is no hepatosplenomegaly. There is no abdominal tenderness. There is no rebound, no guarding and no CVA tenderness.  Musculoskeletal:        General: Normal range of motion.     Cervical back: Normal range of motion and neck supple.  Lymphadenopathy:    She has no cervical adenopathy.       Right cervical: No superficial cervical adenopathy present.   She has no axillary adenopathy.       Right: No inguinal and no supraclavicular adenopathy present.       Left: No inguinal and no supraclavicular adenopathy present.  Neurological: She is alert and oriented to person, place, and time.  Skin: Skin is warm, dry and intact. No bruising, no lesion and no rash noted. No erythema. No pallor.  Psychiatric: She has a normal mood and affect. Her behavior is normal. Judgment and thought content normal.    Infusion on 11/05/2019  Component Date Value Ref Range Status  . Magnesium 11/05/2019 1.4* 1.7 - 2.4 mg/dL Final   Performed at [facility], [address] [place], [place]  [address]  . Sodium 11/05/2019 135  135 - 145 mmol/L Final  . Potassium 11/05/2019 3.9  3.5 - 5.1 mmol/L Final  . Chloride 11/05/2019  103  98 - 111 mmol/L Final  . CO2 11/05/2019 23  22 - 32 mmol/L Final  . Glucose, Bld 11/05/2019 132* 70 - 99 mg/dL Final   Glucose reference range applies only to samples taken after fasting for at least 8 hours.  . BUN 11/05/2019 21  8 - 23 mg/dL Final  . Creatinine, Ser 11/05/2019 0.91  0.44 - 1.00 mg/dL Final  . Calcium 11/05/2019 8.7* 8.9 - 10.3 mg/dL Final  . Total Protein 11/05/2019 6.6  6.5 - 8.1 g/dL Final  . Albumin 11/05/2019 3.2* 3.5 - 5.0 g/dL Final  . AST 11/05/2019 22  15 - 41 U/L Final  . ALT 11/05/2019 14  0 - 44 U/L Final  . Alkaline Phosphatase 11/05/2019 74  38 - 126 U/L Final  . Total Bilirubin 11/05/2019 0.7  0.3 - 1.2 mg/dL Final  . GFR calc non Af Amer 11/05/2019 >60  >60 mL/min Final  . GFR calc Af Amer 11/05/2019 >60  >60 mL/min Final  . Anion gap 11/05/2019 9  5 - 15 Final   Performed at [facility], [address], [place], [place] [address]  . WBC 11/05/2019 5.8  4.0 - 10.5 K/uL Final  . RBC 11/05/2019 3.40* 3.87 - 5.11 MIL/uL Final  . Hemoglobin 11/05/2019 11.1* 12.0 - 15.0 g/dL Final  . HCT 11/05/2019 34.2* 36.0 - 46.0 % Final  . MCV 11/05/2019 100.6* 80.0 - 100.0 fL Final  . MCH 11/05/2019 32.6  26.0 - 34.0 pg Final  . MCHC 11/05/2019 32.5  30.0 - 36.0 g/dL Final  . RDW 11/05/2019 18.8* 11.5 - 15.5 % Final  . Platelets 11/05/2019 268  150 - 400 K/uL Final  . nRBC 11/05/2019 0.0  0.0 - 0.2 % Final  . Neutrophils Relative % 11/05/2019 56  % Final  . Neutro Abs 11/05/2019 3.2  1.7 - 7.7 K/uL Final  . Lymphocytes Relative 11/05/2019 21  % Final  . Lymphs Abs 11/05/2019 1.2  0.7 - 4.0 K/uL Final  . Monocytes Relative 11/05/2019 19  % Final  . Monocytes Absolute 11/05/2019 1.1* 0.1 - 1.0 K/uL Final  . Eosinophils Relative 11/05/2019 2  % Final  . Eosinophils Absolute 11/05/2019 0.1  0.0 - 0.5 K/uL Final  .  Basophils Relative 11/05/2019 1  % Final  . Basophils Absolute 11/05/2019 0.1  0.0 - 0.1 K/uL Final  . Immature Granulocytes 11/05/2019 1  % Final  . Abs Immature Granulocytes 11/05/2019 0.06  0.00 - 0.07 K/uL Final   Performed at [facility], [address], [place], [place] [address]    Assessment:  Konnor Jorden is a 79 y.o. female withstage IVB B-cell lymphoma, unclassifiable, with features intermediate between diffuse large B-cell lymphoma and classical Hodgkin's lymphoma ("gray zone" lymphoma). She underwent right iliac wing bone/bone marrow biopsy on 03/08/2016.  PET scanon 03/03/2016 revealed a hypermetabolic left lower lobe mass, bilateral pleural and pulmonary parenchymal nodular metastasis, a large pleural lesion invading the anterior left chest wall. There was activity in the superior left ocular orbit. There was a large lesion centered in the right iliac wing with soft tissue extension. There were hypermetabolic right external iliac lymph nodes  [place]oon 04/04/2016 revealed an EF of 55-60%. Hepatitis B and C testing was negative on 03/30/2016. G6PD assay was normal. She declined LP with MTX prophylaxis.  She received 6 cycles of mini-RCHOP(04/07/2016 - 07/21/2016). She has had some transient fingertip numbness.  She received radiationto the right iliac wing,  from 10/11/2016 - 11/01/2016. She received 3000 cGy over 3 weeks.  PET scanon 01/29/2017 revealed interval progression of hypermetabolic nodules in the right upper (13 mm compared to 9 mm; SUV 10) and left lower lobes (26 mm compared to 22 mm; SUV 7.5). There was new hypermetabolic focus of activity along the anterior left pleura although no underlying pleural or lung mass was evident.  She has squamous cell lung cancer. Foundation Oneon the lung biopsy on 03/15/2018 revealed MS-stable, tumor mutational burden 5 Muts/Mb, AKT2 amplification, CCNE1 amplification, CREBBP R1446L, KDR  amplification, KIT amplification, PDGFRA amplification, PTEN loss exons 2-4, and JS28 splice site 315+1V>O. Marland Kitchen There were no alterations in EGFR, KRAS, RET, ALK, MET, ERBB2, BRAF, ROS1. PDL-1IHC analysis revealed a TPS score of 50%.  CT guided left lower lobe nodule biopsyon 02/19/2017 confirmed squamous cell carcinoma. She underwent SBRT08/27/2018 - 04/09/2017. She received 5000 cGy in 5 fractions to the left lower lobe lesion. She underwent SBRTto a RUL lesion from 05/23/2017 - 06/18/2017.  CT guided left upper lobe nodule biopsyon 02/20/2018 revealedsquamous cell lung cancer. She received SBRTfrom 04/09/2018 - 04/15/2018. She received 6000 cGy in 5 fractions.   Bronchoscopyon 05/30/2018 revealed no endobronchial lesions. The 2 cm pre-carinal lymph node was biopsied. Pathology revealed metastatic non-small cell carcinoma, favor squamous cell carcinoma.  CT guided right paraspinal lesion biopsyon 10/16/2017 was compatible with involvement by the patient's previously diagnosed lymphoma. The case was referred to [facility] hematopathology. The neoplastic cells were largely CD30 negative. Repeat CD30 immunohistochemistry faintly stained >10% of the large atypical cells. CD20 stained the scattered large atypical cells and few small cells.   She received 3 cycles of brentuximab vedotin(11/23/2017 - 01/11/2018).Her lymphoma progressed.  She is s/p14cycles of pembrolizumab(06/14/2018 -05/21/2019). She received a steroid Dosepak on 01/09/2019 for minor chest CT changes felt secondary to pembrolizumab.  She is s/p 3 cycles of pembolizumab, carboplatin and Abraxane (08/29/2019 - 10/15/2019). She experienced tinnitus after her first day of chemotherapy.  Tinnitus was minor with cycle #2.  Chest, abdomen, and pelvis CTon 06/06/2019 revealed an interval mass-like opacity in the superior aspect of the left parahilar lungwith an interval more prominent mass-like component  involving the pleura laterally at the level with slightly more prominent extension into the left hila region, suspicious for recurrent or progressive lung cancer. There was mediastinal and bilateral hilar adenopathyc/wmetastatic adenopathy or involvement with the patient's B-cell lymphoma. There was no evidence of metastatic disease or lymphoma involving the abdomen or pelvis. There was significantly improved ground-glass opacities in the right lung, minimal left pleural fluid(improved), andastable large hiatal hernia.There was stable enlarged thyroid gland containing heterogeneous nodules with substernal extension on the left.There was stable to slightly decreased size of the previously demonstrated right adrenal nodule and no significant change in a small, poorly visualized left adrenal nodule.  PET scanon 06/17/2019 revealed a known left parahilar lung mass markedly hypermetabolic with associated hypermetabolic lymphadenopathy in both hilar regions, the mediastinum, andthe right thoracic inlet. There was hypermetabolism in the right submandibular gland, indeterminate. There was no hypermetabolic metastatic disease evident in the abdomen or pelvis. There was a stable bilateral adrenal adenomas since 01/29/2018. There were tiny left pleural effusion and a large hiatal hernia.There was a segment of transverse colon extendingup into the hiatal hernia without obstructive features.   Shereceived4000cGyto the left hilar regionfrom12/14/2020 -08/15/2019.  She hasiron deficiency anemia. Ferritinhas been followed: 14 on 05/06/2018,23 on 07/26/2018, and 59 on 11/01/2018. She denies any bleeding.  She has a grade I neuropathy.  B12 and folate were normal on 12/06/2017. She has severe bilateral knee arthritis. She has received steroid injections and Monovisc (hyaluronate derivatives) without improvement.   She is deafand requires an interpreter.  Symptomatically, she is doing  well.  Her family describes a tranient episode when she became weak.  She denies any shortness of breath.  She has had transient diarrhea which has improved per the patient.  She has chronic knee pain.  She has a neuropathy in her left 2nd and third fingers, and the sole of her feet (heel more sensitive).  Plan: 1.   Labs today:  CBC with diff, CMP, Mg.  2.Gray zone lymphoma Clinically,she is doing well. Exam reveals no adenopathy or hepatosplenomegaly. She has received 6 cycles of mini-RCOP, 3 cycles ofbrentuximab vedotin, and 16cycles of pembrolizumab. PET scan on 06/17/2019 revealed progressive disease in the chest felt secondary toprogressive lung cancer. She is s/p 2 additional cycle of maintenance pembrolizumab (last 10/15/2019). Labs reviewed.  Cycle #4 maintenance pembrolizumab. Continue to monitor diarrhea which appears transient and unrelated to pembrolizumab. 3. Clinical stage [id number] (stage IIIC) squamous cell lung cancer Clinically, she appears to be doing well. Patient is s/p SBRT to left upper lobe and left lower lobe nodules. Patient is s/p 15cyclesof pembrolizumab. Patient is s/p palliative radiation to the right hilar region (completed 08/15/2019). She is s/p 3 cycles of carboplatin and Abraxane (08/29/2019 - 10/15/2019). Abraxane 100 mg/m2 IV on days 1 and 8. Carboplatin AUC 5 on day 1. Cycle length every 21 days. Pembrolizumab incorporated for maintenance of gray zone lymphoma on day 1.  She was unable to tolerate day 15 Abraxane secondary to blood counts with cycle #1. She experienced significant tinnitus with cycle #1 and minimal tinnitus with cycle #2.   She had no tinnitus with cycle #3.  She has a grade I neuropathy in her left hand and feet. Labs reviewed.  Day 1 of cycle #4 carboplatin and Abraxane.  Re-review plans for 4-6 cycles of  carboplatin, Abraxane and pembrolizumab.  Schedule restaging chest abdomen and pelvic CT scan on 11/24/2019 (prior to cycle #5).       Discuss symptom management. She has antiemetics to use on a prn bases.  Interventions are adequate.        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 Neuropathy in her feet (heels) seems more prominent. Continue to monitor closely on Abraxane. Ensures scans continue to document improvement regarding risk vs benefit of continued Abraxane. 5. B12 deficiency She remains on oral B12. B12 and folate were normal on 12/17/2018. Check levels annually. 6.Bilateral knee pain She has baseline arthritis which may be exacerbated by pembrolizumab. Shedeclinedsteroid injections. She receives Tylenol on chemotherapy days for her knee pain. Continue to monitor closely on pembrolizumab.  Ensure scans continue to document improvement regarding ongoing risk vs benefit of continued pembrolizumab. 7.   Hypomagnesemia             Magnesium 1.4.             Magnesium sulfate 2 gm IV today.             Patient and family would like to try oral magnesium watching closely for diarrhea.  Rx: Magnesium sulfate 400 mg p.o. every other day. 9.   Day 1 of cycle #4 carboplatin, Abraxane, and pembrolizumab.  10.   Chest, abdomen, and pelvis CT on 11/24/2019. 11.   RTC in 1 week for MD assessment, labs (  CBC with diff, CMP, Mg), and day 8 Abraxane. 12.   RTC in 3 weeks for MD assessment, labs (CBC with diff, CMP, Mg, LDH, uric acid), review of scans, and day 1 of cycle #5 carboplatin, Abraxane, and pembrolizumab.  I discussed the assessment and treatment plan with the patient.  The patient was provided an opportunity to ask questions and all were answered.  The patient agreed with the plan and demonstrated an understanding of the instructions.  The patient was advised to call back if  the symptoms worsen or if the condition fails to improve as anticipated.  I provided 23 minutes (10:01 AM - 10:24 AM) of face-to-face time during this this encounter and > 50% was spent counseling as documented under my assessment and plan.    Lequita Asal, MD, PhD    11/05/2019, 10:01 AM  I, Heywood Footman, am acting as Education administrator for [employer]. Mike Gip, MD, PhD.  I, Melissa C. Mike Gip, MD, have reviewed the above documentation for accuracy and completeness, and I agree with the above.

## 2019-11-05 ENCOUNTER — Other Ambulatory Visit: Payer: Self-pay

## 2019-11-05 ENCOUNTER — Inpatient Hospital Stay (HOSPITAL_BASED_OUTPATIENT_CLINIC_OR_DEPARTMENT_OTHER): Payer: Medicare HMO | Admitting: Hematology and Oncology

## 2019-11-05 ENCOUNTER — Inpatient Hospital Stay: Payer: Medicare HMO | Attending: Hematology and Oncology

## 2019-11-05 ENCOUNTER — Encounter: Payer: Self-pay | Admitting: Hematology and Oncology

## 2019-11-05 ENCOUNTER — Inpatient Hospital Stay: Payer: Medicare HMO

## 2019-11-05 VITALS — BP 117/71 | HR 111 | Temp 96.6°F | Resp 18 | Wt 179.0 lb

## 2019-11-05 DIAGNOSIS — K449 Diaphragmatic hernia without obstruction or gangrene: Secondary | ICD-10-CM | POA: Insufficient documentation

## 2019-11-05 DIAGNOSIS — R531 Weakness: Secondary | ICD-10-CM | POA: Diagnosis not present

## 2019-11-05 DIAGNOSIS — C3412 Malignant neoplasm of upper lobe, left bronchus or lung: Secondary | ICD-10-CM | POA: Diagnosis not present

## 2019-11-05 DIAGNOSIS — M25562 Pain in left knee: Secondary | ICD-10-CM | POA: Diagnosis not present

## 2019-11-05 DIAGNOSIS — D72819 Decreased white blood cell count, unspecified: Secondary | ICD-10-CM | POA: Diagnosis not present

## 2019-11-05 DIAGNOSIS — D3501 Benign neoplasm of right adrenal gland: Secondary | ICD-10-CM | POA: Insufficient documentation

## 2019-11-05 DIAGNOSIS — Z79899 Other long term (current) drug therapy: Secondary | ICD-10-CM | POA: Diagnosis not present

## 2019-11-05 DIAGNOSIS — C3490 Malignant neoplasm of unspecified part of unspecified bronchus or lung: Secondary | ICD-10-CM

## 2019-11-05 DIAGNOSIS — Z806 Family history of leukemia: Secondary | ICD-10-CM | POA: Insufficient documentation

## 2019-11-05 DIAGNOSIS — M25561 Pain in right knee: Secondary | ICD-10-CM | POA: Diagnosis not present

## 2019-11-05 DIAGNOSIS — M170 Bilateral primary osteoarthritis of knee: Secondary | ICD-10-CM | POA: Insufficient documentation

## 2019-11-05 DIAGNOSIS — T451X5A Adverse effect of antineoplastic and immunosuppressive drugs, initial encounter: Secondary | ICD-10-CM | POA: Diagnosis not present

## 2019-11-05 DIAGNOSIS — R251 Tremor, unspecified: Secondary | ICD-10-CM | POA: Diagnosis not present

## 2019-11-05 DIAGNOSIS — Z5111 Encounter for antineoplastic chemotherapy: Secondary | ICD-10-CM | POA: Diagnosis present

## 2019-11-05 DIAGNOSIS — H9190 Unspecified hearing loss, unspecified ear: Secondary | ICD-10-CM | POA: Diagnosis not present

## 2019-11-05 DIAGNOSIS — Z87891 Personal history of nicotine dependence: Secondary | ICD-10-CM | POA: Insufficient documentation

## 2019-11-05 DIAGNOSIS — C3432 Malignant neoplasm of lower lobe, left bronchus or lung: Secondary | ICD-10-CM | POA: Diagnosis not present

## 2019-11-05 DIAGNOSIS — C8518 Unspecified B-cell lymphoma, lymph nodes of multiple sites: Secondary | ICD-10-CM | POA: Diagnosis not present

## 2019-11-05 DIAGNOSIS — J90 Pleural effusion, not elsewhere classified: Secondary | ICD-10-CM | POA: Insufficient documentation

## 2019-11-05 DIAGNOSIS — R197 Diarrhea, unspecified: Secondary | ICD-10-CM | POA: Insufficient documentation

## 2019-11-05 DIAGNOSIS — G620 Drug-induced polyneuropathy: Secondary | ICD-10-CM | POA: Insufficient documentation

## 2019-11-05 DIAGNOSIS — Z833 Family history of diabetes mellitus: Secondary | ICD-10-CM | POA: Insufficient documentation

## 2019-11-05 DIAGNOSIS — Z8269 Family history of other diseases of the musculoskeletal system and connective tissue: Secondary | ICD-10-CM | POA: Insufficient documentation

## 2019-11-05 DIAGNOSIS — C7951 Secondary malignant neoplasm of bone: Secondary | ICD-10-CM

## 2019-11-05 DIAGNOSIS — E876 Hypokalemia: Secondary | ICD-10-CM

## 2019-11-05 DIAGNOSIS — C8590 Non-Hodgkin lymphoma, unspecified, unspecified site: Secondary | ICD-10-CM

## 2019-11-05 DIAGNOSIS — E049 Nontoxic goiter, unspecified: Secondary | ICD-10-CM | POA: Insufficient documentation

## 2019-11-05 DIAGNOSIS — E538 Deficiency of other specified B group vitamins: Secondary | ICD-10-CM | POA: Insufficient documentation

## 2019-11-05 DIAGNOSIS — C8338 Diffuse large B-cell lymphoma, lymph nodes of multiple sites: Secondary | ICD-10-CM | POA: Insufficient documentation

## 2019-11-05 DIAGNOSIS — G8929 Other chronic pain: Secondary | ICD-10-CM

## 2019-11-05 DIAGNOSIS — D3502 Benign neoplasm of left adrenal gland: Secondary | ICD-10-CM | POA: Insufficient documentation

## 2019-11-05 DIAGNOSIS — Z5112 Encounter for antineoplastic immunotherapy: Secondary | ICD-10-CM | POA: Insufficient documentation

## 2019-11-05 DIAGNOSIS — Z801 Family history of malignant neoplasm of trachea, bronchus and lung: Secondary | ICD-10-CM | POA: Insufficient documentation

## 2019-11-05 LAB — CBC WITH DIFFERENTIAL/PLATELET
Abs Immature Granulocytes: 0.06 10*3/uL (ref 0.00–0.07)
Basophils Absolute: 0.1 10*3/uL (ref 0.0–0.1)
Basophils Relative: 1 %
Eosinophils Absolute: 0.1 10*3/uL (ref 0.0–0.5)
Eosinophils Relative: 2 %
HCT: 34.2 % — ABNORMAL LOW (ref 36.0–46.0)
Hemoglobin: 11.1 g/dL — ABNORMAL LOW (ref 12.0–15.0)
Immature Granulocytes: 1 %
Lymphocytes Relative: 21 %
Lymphs Abs: 1.2 10*3/uL (ref 0.7–4.0)
MCH: 32.6 pg (ref 26.0–34.0)
MCHC: 32.5 g/dL (ref 30.0–36.0)
MCV: 100.6 fL — ABNORMAL HIGH (ref 80.0–100.0)
Monocytes Absolute: 1.1 10*3/uL — ABNORMAL HIGH (ref 0.1–1.0)
Monocytes Relative: 19 %
Neutro Abs: 3.2 10*3/uL (ref 1.7–7.7)
Neutrophils Relative %: 56 %
Platelets: 268 10*3/uL (ref 150–400)
RBC: 3.4 MIL/uL — ABNORMAL LOW (ref 3.87–5.11)
RDW: 18.8 % — ABNORMAL HIGH (ref 11.5–15.5)
WBC: 5.8 10*3/uL (ref 4.0–10.5)
nRBC: 0 % (ref 0.0–0.2)

## 2019-11-05 LAB — COMPREHENSIVE METABOLIC PANEL
ALT: 14 U/L (ref 0–44)
AST: 22 U/L (ref 15–41)
Albumin: 3.2 g/dL — ABNORMAL LOW (ref 3.5–5.0)
Alkaline Phosphatase: 74 U/L (ref 38–126)
Anion gap: 9 (ref 5–15)
BUN: 21 mg/dL (ref 8–23)
CO2: 23 mmol/L (ref 22–32)
Calcium: 8.7 mg/dL — ABNORMAL LOW (ref 8.9–10.3)
Chloride: 103 mmol/L (ref 98–111)
Creatinine, Ser: 0.91 mg/dL (ref 0.44–1.00)
GFR calc Af Amer: >60 mL/min (ref >60)
GFR calc non Af Amer: >60 mL/min (ref >60)
Glucose, Bld: 132 mg/dL — ABNORMAL HIGH (ref 70–99)
Potassium: 3.9 mmol/L (ref 3.5–5.1)
Sodium: 135 mmol/L (ref 135–145)
Total Bilirubin: 0.7 mg/dL (ref 0.3–1.2)
Total Protein: 6.6 g/dL (ref 6.5–8.1)

## 2019-11-05 LAB — MAGNESIUM: Magnesium: 1.4 mg/dL — ABNORMAL LOW (ref 1.7–2.4)

## 2019-11-05 MED ORDER — ACETAMINOPHEN 325 MG PO TABS
650.0000 mg | ORAL_TABLET | Freq: Once | ORAL | Status: AC
  Administered 2019-11-05: 11:00:00 650 mg via ORAL

## 2019-11-05 MED ORDER — MAGNESIUM SULFATE 2 GM/50ML IV SOLN
2.0000 g | Freq: Once | INTRAVENOUS | Status: AC
  Administered 2019-11-05: 11:00:00 2 g via INTRAVENOUS

## 2019-11-05 MED ORDER — PACLITAXEL PROTEIN-BOUND CHEMO INJECTION 100 MG
100.0000 mg/m2 | Freq: Once | INTRAVENOUS | Status: AC
  Administered 2019-11-05: 13:00:00 175 mg via INTRAVENOUS
  Filled 2019-11-05: qty 35

## 2019-11-05 MED ORDER — ACETAMINOPHEN 325 MG PO TABS
ORAL_TABLET | ORAL | Status: AC
  Filled 2019-11-05: qty 2

## 2019-11-05 MED ORDER — MAGNESIUM OXIDE 400 (241.3 MG) MG PO TABS: 400.0000 mg | ORAL_TABLET | ORAL | 0 refills | Status: DC

## 2019-11-05 MED ORDER — SODIUM CHLORIDE 0.9% FLUSH
10.0000 mL | INTRAVENOUS | Status: DC | PRN
  Administered 2019-11-05: 09:00:00 10 mL via INTRAVENOUS
  Filled 2019-11-05: qty 10

## 2019-11-05 MED ORDER — SODIUM CHLORIDE 0.9 % IV SOLN
10.0000 mg | Freq: Once | INTRAVENOUS | Status: AC
  Administered 2019-11-05: 10 mg via INTRAVENOUS
  Filled 2019-11-05: qty 10

## 2019-11-05 MED ORDER — HEPARIN SOD (PORK) LOCK FLUSH 100 UNIT/ML IV SOLN
500.0000 [IU] | Freq: Once | INTRAVENOUS | Status: DC
  Filled 2019-11-05: qty 5

## 2019-11-05 MED ORDER — SODIUM CHLORIDE 0.9 % IV SOLN
150.0000 mg | Freq: Once | INTRAVENOUS | Status: AC
  Administered 2019-11-05: 150 mg via INTRAVENOUS
  Filled 2019-11-05: qty 5

## 2019-11-05 MED ORDER — SODIUM CHLORIDE 0.9 % IV SOLN
200.0000 mg | Freq: Once | INTRAVENOUS | Status: AC
  Administered 2019-11-05: 200 mg via INTRAVENOUS
  Filled 2019-11-05: qty 8

## 2019-11-05 MED ORDER — HEPARIN SOD (PORK) LOCK FLUSH 100 UNIT/ML IV SOLN
500.0000 [IU] | Freq: Once | INTRAVENOUS | Status: AC | PRN
  Administered 2019-11-05: 500 [IU]
  Filled 2019-11-05: qty 5

## 2019-11-05 MED ORDER — SODIUM CHLORIDE 0.9 % IV SOLN
Freq: Once | INTRAVENOUS | Status: AC
  Filled 2019-11-05: qty 250

## 2019-11-05 MED ORDER — SODIUM CHLORIDE 0.9 % IV SOLN
420.0000 mg | Freq: Once | INTRAVENOUS | Status: AC
  Administered 2019-11-05: 420 mg via INTRAVENOUS
  Filled 2019-11-05: qty 42

## 2019-11-05 MED ORDER — PALONOSETRON HCL INJECTION 0.25 MG/5ML
0.2500 mg | Freq: Once | INTRAVENOUS | Status: AC
  Administered 2019-11-05: 0.25 mg via INTRAVENOUS
  Filled 2019-11-05: qty 5

## 2019-11-06 ENCOUNTER — Ambulatory Visit: Payer: Medicare HMO | Admitting: Hematology and Oncology

## 2019-11-06 ENCOUNTER — Telehealth: Payer: Self-pay

## 2019-11-06 ENCOUNTER — Other Ambulatory Visit: Payer: Medicare HMO

## 2019-11-06 ENCOUNTER — Other Ambulatory Visit: Payer: Self-pay | Admitting: Hematology and Oncology

## 2019-11-06 ENCOUNTER — Ambulatory Visit: Payer: Medicare HMO

## 2019-11-06 DIAGNOSIS — E876 Hypokalemia: Secondary | ICD-10-CM

## 2019-11-06 NOTE — Telephone Encounter (Signed)
Spoke with ms Toni Griffith to see how potassium Ms Toni Griffith is curently taking and she reports on the visit 3/24 they were told to stop taking potassium so at this time the patient is currently not taking any potassium. I have inform Dr Mike Gip

## 2019-11-10 NOTE — Progress Notes (Signed)
[facility]  [address], [address] [place], [place] [address] Phone: [phone]  Fax: [phone]   Clinic Day:  11/12/2019  Referring physician: Francesca Oman, DO  Chief Complaint: Toni Griffith is a 79 y.o. 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   HPI: The patient was last seen in the medical oncology clinic on 11/05/2019.  At that time, she denied any B symptoms.  She denied any shortness of breath or cough.  Her family described an episode of her feeling weak. Her heels hurt like there was something in her shoe.  Chronic knee pain was stable.  Hematocrit was 34.2, hemoglobin 11.1, MCV 100.6, platelets 268,000, WBC 5800, ANC 3200.  Magnesium 1.4.  She received day 1 of cycle #4 carboplatin, Abraxane, and pembrolizumab.  She received 2 gm of IV magnesium sulfate and was given a prescription for magnesium oxide 400 mg very other day.  She notes that her last treatment went well. She feels fine today other than some slight pain in her left knee. There was no tinnitus after her last treatment. She had diarrhea after treatment but her bowels are back to normal.   She took the magnesium pills yesterday and was "ok" without recurrence of diarrhea. She did not take it last week because she was having issues with diarrhea. She has had no new episodes of feeling weak. She got her new schedule for the CT scans. The scan that Dr. Baruch Gouty ordered has been canceled. She no longer takes her potassium. She asked if she can stop taking magnesium pills; she was instructed to take one after 2 days if no diarrhea is present and take the second one 2 days after the first one if no diarrhea is present.  She still has neuropathy on the 2nd and 3rd digits in her left hand. She describes the neuropathy in her feet as "pins and needles" and stable from last week.       Past Medical History:  Diagnosis Date  . Arthritis   . Cancer [facility])    lymphoma-right hip  . Deaf   . GERD (gastroesophageal reflux disease)   . Hypertension    NOT ON MED AT PRESENT  . Squamous cell lung cancer [facility])     Past Surgical History:  Procedure Laterality Date  . CARDIAC SURGERY  1993  . CORONARY ARTERY BYPASS GRAFT     DOUBLE  . CYST REMOVAL HAND Right   . ENDOBRONCHIAL ULTRASOUND N/A 05/30/2018   Procedure: ENDOBRONCHIAL ULTRASOUND;  Surgeon: Tyler Pita, MD;  Location: [facility] ORS;  Service: Cardiopulmonary;  Laterality: N/A;  . PERIPHERAL VASCULAR CATHETERIZATION N/A 04/06/2016   Procedure: Glori Luis Cath Insertion;  Surgeon: Algernon Huxley, MD;  Location: [place] [facility];  Service: Cardiovascular;  Laterality: N/A;    Family History  Problem Relation Age of Onset  . Lung cancer Mother   . Lung cancer Father   . Diabetes Maternal Uncle   . Myasthenia gravis Maternal Grandmother   . Leukemia Maternal Grandfather     Social History:  reports that she quit smoking about 3 years ago. Her smoking use included cigarettes. She has a 80.00 pack-year smoking history. She has never used smokeless tobacco. She reports current alcohol use. She reports that she does not use drugs. She smoked 1 1/2 packs per day for 20 years (30 pack year  smoking history) until 10/2015. She is hearing impaired (deaf). She lives in [place] with her boyfriend. Her daughter lives in [place]. Her daughter, [name]'s contact number is [phone].She has 2 cats named Pepper and Baby.  The patient is accompanied by Theressa Stamps and her daughter on the phone today.  Allergies: No Known Allergies  Current Medications: Current Outpatient Medications  Medication Sig Dispense Refill  . acetaminophen (TYLENOL) 500 MG tablet Take 500 mg by mouth every 6 (six) hours as needed for moderate pain.     Marland Kitchen allopurinol (ZYLOPRIM) 300 MG tablet TAKE 1 TABLET BY MOUTH EVERY DAY 90 tablet 0  .  aspirin EC 81 MG tablet Take 81 mg by mouth daily.    Marland Kitchen atorvastatin (LIPITOR) 80 MG tablet Take 80 mg by mouth every evening.     Marland Kitchen b complex vitamins tablet Take 1 tablet by mouth daily.    Carin Hock Iron (IRON CHEWS PEDIATRIC PO) Take 5 mg by mouth daily.    . Cholecalciferol (VITAMIN D3) 2000 units capsule Take 1,000 Units by mouth daily.     Marland Kitchen dextromethorphan 15 MG/5ML syrup Take 10 mLs by mouth 4 (four) times daily as needed for cough.    . DULoxetine (CYMBALTA) 60 MG capsule     . lidocaine-prilocaine (EMLA) cream Apply cream 1 hour before chemotherapy treatment and place small peive of saran wrap over cream to protect clothing 30 g 3  . magnesium oxide (MAG-OX) 400 (241.3 Mg) MG tablet Take 1 tablet (400 mg total) by mouth every other day. 30 tablet 0  . Omega-3 Fatty Acids (FISH OIL PO) Take 1 tablet by mouth daily.    . vitamin E 400 UNIT capsule Take 400 Units by mouth daily.     . ondansetron (ZOFRAN ODT) 4 MG disintegrating tablet Take 1 tablet (4 mg total) by mouth every 8 (eight) hours as needed for nausea or vomiting. (Patient not taking: Reported on 11/12/2019) 30 tablet 1  . pantoprazole (PROTONIX) 40 MG tablet Take 40 mg by mouth daily.     . potassium chloride SA (KLOR-CON) 20 MEQ tablet Take 1 tablet (20 mEq total) by mouth daily. Take as directed by MD. (Patient not taking: Reported on 11/06/2019) 30 tablet 0   No current facility-administered medications for this visit.   Facility-Administered Medications Ordered in Other Visits  Medication Dose Route Frequency Provider Last Rate Last Admin  . heparin lock flush 100 unit/mL  500 Units Intravenous Once Morgen Linebaugh C, MD      . heparin lock flush 100 unit/mL  500 Units Intravenous Once Rabia Argote C, MD      . sodium chloride flush (NS) 0.9 % injection 10 mL  10 mL Intravenous PRN Nolon Stalls C, MD   10 mL at 04/05/17 1034  . sodium chloride flush (NS) 0.9 % injection 10 mL  10 mL Intravenous PRN Lequita Asal, MD   10 mL at 02/27/19 1033  . sodium chloride flush (NS) 0.9 % injection 10 mL  10 mL Intravenous PRN Lequita Asal, MD   10 mL at 11/12/19 0940    Review of Systems  Constitutional: Negative for chills, diaphoresis, fever, malaise/fatigue and weight loss (3 pounds).       Feels "fine".  HENT: Positive for hearing loss (deaf). Negative for congestion, sore throat and tinnitus.   Eyes: Negative.  Negative for blurred vision and double vision.  Respiratory: Negative.  Negative for cough, sputum production and shortness  of breath.   Cardiovascular: Negative.  Negative for chest pain, palpitations, orthopnea and leg swelling.  Gastrointestinal: Positive for diarrhea (intermittent, no apparent worsening with oral magnesium). Negative for abdominal pain, blood in stool, constipation, heartburn, nausea and vomiting.  Genitourinary: Negative.  Negative for dysuria, frequency, hematuria and urgency.  Musculoskeletal: Positive for joint pain (knees-chronic). Negative for back pain, falls, myalgias and neck pain.  Skin: Negative.  Negative for itching and rash.  Neurological: Positive for sensory change (baseline neuropathy unchanged; left hand 2nd and 3rd digits; neuropathy in sole of feet unchanged). Negative for dizziness, weakness and headaches.  Endo/Heme/Allergies: Negative.   Psychiatric/Behavioral: Negative for depression and memory loss. The patient is not nervous/anxious and does not have insomnia.   All other systems reviewed and are negative.  Performance status (ECOG): 2  Vitals Blood pressure 128/83, pulse 99, temperature (!) 97 F (36.1 C), temperature source Tympanic, resp. rate 18, height _0  (1.6 m), weight 176 lb (79.8 kg), SpO2 95 %.   Physical Exam  Constitutional: She is oriented to person, place, and time. She appears well-developed and well-nourished. No distress. Face mask in place.  Patient sitting comfortably in a wheelchair secondary to decreased  mobility due to knee pain.   HENT:  Head: Normocephalic and atraumatic.  Right Ear: Hearing normal.  Left Ear: Hearing normal.  Mouth/Throat: Oropharynx is clear and moist and mucous membranes are normal. No oral lesions. No oropharyngeal exudate.  Wearing a head wrap.  Eyes: Pupils are equal, round, and reactive to light. Conjunctivae and EOM are normal. Right eye exhibits no discharge. Left eye exhibits no discharge. No scleral icterus.  Hazel eyes.   Neck: No JVD present.  Cardiovascular: Normal rate, regular rhythm and normal heart sounds. Exam reveals no gallop and no friction rub.  No murmur heard. Pulmonary/Chest: Effort normal and breath sounds normal. No respiratory distress. She has no wheezes. She has no rhonchi. She has no rales.  Abdominal: Soft. Normal appearance and bowel sounds are normal. She exhibits no distension and no mass. There is no hepatosplenomegaly. There is no abdominal tenderness. There is no rebound, no guarding and no CVA tenderness.  Musculoskeletal:        General: No edema. Normal range of motion.     Cervical back: Normal range of motion and neck supple.  Lymphadenopathy:       Head (right side): No preauricular, no posterior auricular and no occipital adenopathy present.       Head (left side): No preauricular, no posterior auricular and no occipital adenopathy present.    She has no cervical adenopathy.    She has no axillary adenopathy.       Right: No inguinal and no supraclavicular adenopathy present.       Left: No inguinal and no supraclavicular adenopathy present.  Neurological: She is alert and oriented to person, place, and time.  Skin: Skin is warm, dry and intact. No bruising, no lesion and no rash noted. She is not diaphoretic. No erythema. No pallor.  Psychiatric: She has a normal mood and affect. Her behavior is normal. Judgment and thought content normal.  Nursing note and vitals reviewed.   Infusion on 11/12/2019  Component Date Value  Ref Range Status  . Magnesium 11/12/2019 1.7  1.7 - 2.4 mg/dL Final   Performed at [facility], [address], [place], [place] [address]  . Sodium 11/12/2019 134* 135 - 145 mmol/L Final  . Potassium 11/12/2019 4.3  3.5 -  5.1 mmol/L Final  . Chloride 11/12/2019 101  98 - 111 mmol/L Final  . CO2 11/12/2019 26  22 - 32 mmol/L Final  . Glucose, Bld 11/12/2019 96  70 - 99 mg/dL Final   Glucose reference range applies only to samples taken after fasting for at least 8 hours.  . BUN 11/12/2019 16  8 - 23 mg/dL Final  . Creatinine, Ser 11/12/2019 0.67  0.44 - 1.00 mg/dL Final  . Calcium 11/12/2019 8.8* 8.9 - 10.3 mg/dL Final  . Total Protein 11/12/2019 6.7  6.5 - 8.1 g/dL Final  . Albumin 11/12/2019 3.1* 3.5 - 5.0 g/dL Final  . AST 11/12/2019 22  15 - 41 U/L Final  . ALT 11/12/2019 15  0 - 44 U/L Final  . Alkaline Phosphatase 11/12/2019 65  38 - 126 U/L Final  . Total Bilirubin 11/12/2019 0.4  0.3 - 1.2 mg/dL Final  . GFR calc non Af Amer 11/12/2019 >60  >60 mL/min Final  . GFR calc Af Amer 11/12/2019 >60  >60 mL/min Final  . Anion gap 11/12/2019 7  5 - 15 Final   Performed at [facility], [address] [place], [place] [address]  . WBC 11/12/2019 6.2  4.0 - 10.5 K/uL Final  . RBC 11/12/2019 3.17* 3.87 - 5.11 MIL/uL Final  . Hemoglobin 11/12/2019 10.3* 12.0 - 15.0 g/dL Final  . HCT 11/12/2019 31.7* 36.0 - 46.0 % Final  . MCV 11/12/2019 100.0  80.0 - 100.0 fL Final  . MCH 11/12/2019 32.5  26.0 - 34.0 pg Final  . MCHC 11/12/2019 32.5  30.0 - 36.0 g/dL Final  . RDW 11/12/2019 18.2* 11.5 - 15.5 % Final  . Platelets 11/12/2019 220  150 - 400 K/uL Final  . nRBC 11/12/2019 0.0  0.0 - 0.2 % Final  . Neutrophils Relative % 11/12/2019 70  % Final  . Neutro Abs 11/12/2019 4.3  1.7 - 7.7 K/uL Final  . Lymphocytes Relative 11/12/2019 16  % Final  . Lymphs Abs 11/12/2019 1.0  0.7 - 4.0 K/uL Final  . Monocytes Relative 11/12/2019 10  % Final  . Monocytes Absolute  11/12/2019 0.6  0.1 - 1.0 K/uL Final  . Eosinophils Relative 11/12/2019 3  % Final  . Eosinophils Absolute 11/12/2019 0.2  0.0 - 0.5 K/uL Final  . Basophils Relative 11/12/2019 0  % Final  . Basophils Absolute 11/12/2019 0.0  0.0 - 0.1 K/uL Final  . Immature Granulocytes 11/12/2019 1  % Final  . Abs Immature Granulocytes 11/12/2019 0.07  0.00 - 0.07 K/uL Final   Performed at [facility], [address] [place],  [address]    Assessment:  Toni Griffith is a 79 y.o. female  withstage IVB B-cell lymphoma, unclassifiable, with features intermediate between diffuse large B-cell lymphoma and classical Hodgkin's lymphoma ("gray zone" lymphoma). She underwent right iliac wing bone/bone marrow biopsy on 03/08/2016.  PET scanon 03/03/2016 revealed a hypermetabolic left lower lobe mass, bilateral pleural and pulmonary parenchymal nodular metastasis, a large pleural lesion invading the anterior left chest wall. There was activity in the superior left ocular orbit. There was a large lesion centered in the right iliac wing with soft tissue extension. There were hypermetabolic right external iliac lymph nodes  E[place]oon 04/04/2016 revealed an EF of 55-60%. Hepatitis B and C testing was negative on 03/30/2016. G6PD assay was normal. She declined LP with MTX prophylaxis.  She received 6 cycles of mini-RCHOP(04/07/2016 - 07/21/2016). She has had some  transient fingertip numbness.  She received radiationto the right iliac wing, from 10/11/2016 - 11/01/2016. She received 3000 cGy over 3 weeks.  PET scanon 01/29/2017 revealed interval progression of hypermetabolic nodules in the right upper (13 mm compared to 9 mm; SUV 10) and left lower lobes (26 mm compared to 22 mm; SUV 7.5). There was new hypermetabolic focus of activity along the anterior left pleura although no underlying pleural or lung mass was evident.  She has squamous cell lung cancer. Foundation Oneon  the lung biopsy on 03/15/2018 revealed MS-stable, tumor mutational burden 5 Muts/Mb, AKT2 amplification, CCNE1 amplification, CREBBP R1446L, KDR amplification, KIT amplification, PDGFRA amplification, PTEN loss exons 2-4, and CW23 splice site [id number]>T. Marland Kitchen There were no alterations in EGFR, KRAS, RET, ALK, MET, ERBB2, BRAF, ROS1. PDL-1IHC analysis revealed a TPS score of 50%.  CT guided left lower lobe nodule biopsyon 02/19/2017 confirmed squamous cell carcinoma. She underwent SBRT08/27/2018 - 04/09/2017. She received 5000 cGy in 5 fractions to the left lower lobe lesion. She underwent SBRTto a RUL lesion from 05/23/2017 - 06/18/2017.  CT guided left upper lobe nodule biopsyon 02/20/2018 revealedsquamous cell lung cancer. She received SBRTfrom 04/09/2018 - 04/15/2018. She received 6000 cGy in 5 fractions.   Bronchoscopyon 05/30/2018 revealed no endobronchial lesions. The 2 cm pre-carinal lymph node was biopsied. Pathology revealed metastatic non-small cell carcinoma, favor squamous cell carcinoma.  CT guided right paraspinal lesion biopsyon 10/16/2017 was compatible with involvement by the patient's previously diagnosed lymphoma. The case was referred to [facility] hematopathology. The neoplastic cells were largely CD30 negative. Repeat CD30 immunohistochemistry faintly stained >10% of the large atypical cells. CD20 stained the scattered large atypical cells and few small cells.   She received 3 cycles of brentuximab vedotin(11/23/2017 - 01/11/2018).Her lymphoma progressed.  She is s/p14cycles of pembrolizumab(06/14/2018 -05/21/2019). She received a steroid Dosepak on 01/09/2019 for minor chest CT changes felt secondary to pembrolizumab.  She is day 8 of cycle #4 pembolizumab, carboplatin and Abraxane (08/29/2019 - 11/05/2019). She experienced tinnitus after her first day of chemotherapy. Tinnitus was minor with cycle #2.  Chest, abdomen, and pelvis CTon 06/06/2019  revealed an interval mass-like opacity in the superior aspect of the left parahilar lungwith an interval more prominent mass-like component involving the pleura laterally at the level with slightly more prominent extension into the left hila region, suspicious for recurrent or progressive lung cancer. There was mediastinal and bilateral hilar adenopathyc/wmetastatic adenopathy or involvement with the patient's B-cell lymphoma. There was no evidence of metastatic disease or lymphoma involving the abdomen or pelvis. There was significantly improved ground-glass opacities in the right lung, minimal left pleural fluid(improved), andastable large hiatal hernia.There was stable enlarged thyroid gland containing heterogeneous nodules with substernal extension on the left.There was stable to slightly decreased size of the previously demonstrated right adrenal nodule and no significant change in a small, poorly visualized left adrenal nodule.  PET scanon 06/17/2019 revealed a known left parahilar lung mass markedly hypermetabolic with associated hypermetabolic lymphadenopathy in both hilar regions, the mediastinum, andthe right thoracic inlet. There was hypermetabolism in the right submandibular gland, indeterminate. There was no hypermetabolic metastatic disease evident in the abdomen or pelvis. There was a stable bilateral adrenal adenomas since 01/29/2018. There were tiny left pleural effusion and a large hiatal hernia.There was a segment of transverse colon extendingup into the hiatal hernia without obstructive features.   Shereceived4000cGyto the left hilar regionfrom12/14/2020 -08/15/2019.  She hasiron deficiency anemia. Ferritinhas been followed: 14 on 05/06/2018,23 on 07/26/2018, and 59 on 11/01/2018.  She denies any bleeding.  She has a grade I neuropathy. B12 and folate were normal on 12/06/2017. She has severe bilateral knee arthritis. She has received steroid injections  and Monovisc (hyaluronate derivatives) without improvement.   She is deafand requires an interpreter.  Symptomatically, she has had some interval diarrhea which did not appear to increase with oral magnesium.  Neuropathy appears table in her left 2 fingers and bottom of her feet (pronounced in heel).  Exam is stable.  Plan: 1.   Labs today: CBC with diff, CMP, Mg.  2.Gray zone lymphoma Clinically, she appears to be doing well. Exam reveals no adenopathy or hepatosplenomegaly. She has received 6 cycles of mini-RCOP, 3 cycles ofbrentuximab vedotin, and 16cycles of pembrolizumab. PET scan on 06/17/2019 revealed progressive disease in the chest felt secondary toprogressive lung cancer. She is s/p 4 additional cycles of maintenance pembrolizumab (last 11/05/2019). Continue to monitor diarrhea which appears transient and unrelated to pembrolizumab. 3. Clinical stage T4N3Mx (stage IIIC) squamous cell lung cancer Clinically, she appears to be doing well. Patient is s/p SBRT to left upper lobe and left lower lobe nodules. Patient is s/p 15cyclesof pembrolizumab. Patient is s/p palliative radiation to the right hilar region (completed 08/15/2019). She is day 8 of cycle #4 carboplatin and Abraxane (08/29/2019 - 11/05/2019). Abraxane 100 mg/m2 IV on days 1 and 8. Carboplatin AUC 5 on day 1. Cycle length every 21 days. Pembrolizumab incorporated for maintenance of gray zone lymphoma on day 1 She was unable to tolerate day 15 Abraxane secondary to leukopenia with cycle #1   She experienced significant tinnitus with cycle #1 and minimal tinnitus with cycle #2.  She has grade I-II neuropathy in 2 fingers of her left hand and the bottom of her feet. Plan for 4-6 cycles of carboplatin, Abraxane and pembrolizumab if responding to treatment   Labs reviewed.  Day 8 of cycle #4  Abraxane. Restaging studies prior to next cycle. Discuss symptom management.  She has antiemetics at home to use on a prn bases.  Interventions are adequate.     4.Chemotherapy-induced neuropathy Patient has a grade I-II neuropathy in 2 of her fingers of her left hand and bottom of her feet. Neuropathy appears stable. Continue to monitor closely on Abraxane. Ensure scans document stability or improvement regarding risk vs benefit of continued Abraxane. 5. B12 deficiency She remains on oral B12. B12 and folate were normal on 12/17/2018. Check levels in 11/2019. 6.Bilateral knee pain She has baseline arthritis which may be exacerbated by pembrolizumab. Shedeclinedsteroid injections. Ensure scans document stability or improvement regarding risk vs benefit of continued pembrolizumab.  Monitor closely while on pembrolizumab. 7. Hypomagnesemia, resolved Magnesium 1.7. Continue to monitor secondary to prior need for IV magnesium. 8.Day 8 of cycle #4 carboplatin, Abraxane and pembrolizumab today. 9.   RTC as scheduled on 11/26/2019 for MD assessment, labs, review of imaging studies, and +/- day 1 of cycle #5 carboplatin, Abraxane and pembrolizumab.  I discussed the assessment and treatment plan with the patient.  The patient was provided an opportunity to ask questions and all were answered.  The patient agreed with the plan and demonstrated an understanding of the instructions.  The patient was advised to call back if the symptoms worsen or if the condition fails to improve as anticipated.   Lequita Asal, MD, PhD    11/12/2019, 10:13 AM  I, Heywood Footman, am acting as Education administrator for [employer]. Mike Gip, MD, PhD.  I, Vershawn Westrup C. Mike Gip, MD, have reviewed  the above documentation for accuracy and completeness, and I agree with the above.

## 2019-11-12 ENCOUNTER — Other Ambulatory Visit: Payer: Self-pay

## 2019-11-12 ENCOUNTER — Encounter: Payer: Self-pay | Admitting: Hematology and Oncology

## 2019-11-12 ENCOUNTER — Inpatient Hospital Stay (HOSPITAL_BASED_OUTPATIENT_CLINIC_OR_DEPARTMENT_OTHER): Payer: Medicare HMO | Admitting: Hematology and Oncology

## 2019-11-12 ENCOUNTER — Inpatient Hospital Stay: Payer: Medicare HMO

## 2019-11-12 VITALS — BP 128/83 | HR 99 | Temp 97.0°F | Resp 18 | Ht 63.0 in | Wt 176.0 lb

## 2019-11-12 VITALS — BP 138/84 | HR 95 | Temp 97.0°F | Resp 18

## 2019-11-12 DIAGNOSIS — G620 Drug-induced polyneuropathy: Secondary | ICD-10-CM

## 2019-11-12 DIAGNOSIS — C8518 Unspecified B-cell lymphoma, lymph nodes of multiple sites: Secondary | ICD-10-CM | POA: Diagnosis not present

## 2019-11-12 DIAGNOSIS — C8590 Non-Hodgkin lymphoma, unspecified, unspecified site: Secondary | ICD-10-CM

## 2019-11-12 DIAGNOSIS — E538 Deficiency of other specified B group vitamins: Secondary | ICD-10-CM

## 2019-11-12 DIAGNOSIS — T451X5A Adverse effect of antineoplastic and immunosuppressive drugs, initial encounter: Secondary | ICD-10-CM

## 2019-11-12 DIAGNOSIS — C3490 Malignant neoplasm of unspecified part of unspecified bronchus or lung: Secondary | ICD-10-CM | POA: Diagnosis not present

## 2019-11-12 DIAGNOSIS — Z5112 Encounter for antineoplastic immunotherapy: Secondary | ICD-10-CM | POA: Diagnosis not present

## 2019-11-12 DIAGNOSIS — Z5111 Encounter for antineoplastic chemotherapy: Secondary | ICD-10-CM

## 2019-11-12 DIAGNOSIS — G8929 Other chronic pain: Secondary | ICD-10-CM

## 2019-11-12 LAB — COMPREHENSIVE METABOLIC PANEL
ALT: 15 U/L (ref 0–44)
AST: 22 U/L (ref 15–41)
Albumin: 3.1 g/dL — ABNORMAL LOW (ref 3.5–5.0)
Alkaline Phosphatase: 65 U/L (ref 38–126)
Anion gap: 7 (ref 5–15)
BUN: 16 mg/dL (ref 8–23)
CO2: 26 mmol/L (ref 22–32)
Calcium: 8.8 mg/dL — ABNORMAL LOW (ref 8.9–10.3)
Chloride: 101 mmol/L (ref 98–111)
Creatinine, Ser: 0.67 mg/dL (ref 0.44–1.00)
GFR calc Af Amer: >60 mL/min (ref >60)
GFR calc non Af Amer: >60 mL/min (ref >60)
Glucose, Bld: 96 mg/dL (ref 70–99)
Potassium: 4.3 mmol/L (ref 3.5–5.1)
Sodium: 134 mmol/L — ABNORMAL LOW (ref 135–145)
Total Bilirubin: 0.4 mg/dL (ref 0.3–1.2)
Total Protein: 6.7 g/dL (ref 6.5–8.1)

## 2019-11-12 LAB — CBC WITH DIFFERENTIAL/PLATELET
Abs Immature Granulocytes: 0.07 10*3/uL (ref 0.00–0.07)
Basophils Absolute: 0 10*3/uL (ref 0.0–0.1)
Basophils Relative: 0 %
Eosinophils Absolute: 0.2 10*3/uL (ref 0.0–0.5)
Eosinophils Relative: 3 %
HCT: 31.7 % — ABNORMAL LOW (ref 36.0–46.0)
Hemoglobin: 10.3 g/dL — ABNORMAL LOW (ref 12.0–15.0)
Immature Granulocytes: 1 %
Lymphocytes Relative: 16 %
Lymphs Abs: 1 10*3/uL (ref 0.7–4.0)
MCH: 32.5 pg (ref 26.0–34.0)
MCHC: 32.5 g/dL (ref 30.0–36.0)
MCV: 100 fL (ref 80.0–100.0)
Monocytes Absolute: 0.6 10*3/uL (ref 0.1–1.0)
Monocytes Relative: 10 %
Neutro Abs: 4.3 10*3/uL (ref 1.7–7.7)
Neutrophils Relative %: 70 %
Platelets: 220 10*3/uL (ref 150–400)
RBC: 3.17 MIL/uL — ABNORMAL LOW (ref 3.87–5.11)
RDW: 18.2 % — ABNORMAL HIGH (ref 11.5–15.5)
WBC: 6.2 10*3/uL (ref 4.0–10.5)
nRBC: 0 % (ref 0.0–0.2)

## 2019-11-12 LAB — MAGNESIUM: Magnesium: 1.7 mg/dL (ref 1.7–2.4)

## 2019-11-12 MED ORDER — ONDANSETRON HCL 4 MG PO TABS
8.0000 mg | ORAL_TABLET | Freq: Once | ORAL | Status: AC
  Administered 2019-11-12: 8 mg via ORAL

## 2019-11-12 MED ORDER — SODIUM CHLORIDE 0.9 % IV SOLN
Freq: Once | INTRAVENOUS | Status: AC
  Filled 2019-11-12: qty 250

## 2019-11-12 MED ORDER — PACLITAXEL PROTEIN-BOUND CHEMO INJECTION 100 MG
100.0000 mg/m2 | Freq: Once | INTRAVENOUS | Status: AC
  Administered 2019-11-12: 175 mg via INTRAVENOUS
  Filled 2019-11-12: qty 35

## 2019-11-12 MED ORDER — ACETAMINOPHEN 325 MG PO TABS
650.0000 mg | ORAL_TABLET | Freq: Once | ORAL | Status: AC
  Administered 2019-11-12: 650 mg via ORAL

## 2019-11-12 MED ORDER — HEPARIN SOD (PORK) LOCK FLUSH 100 UNIT/ML IV SOLN
500.0000 [IU] | Freq: Once | INTRAVENOUS | Status: AC
  Administered 2019-11-12: 500 [IU] via INTRAVENOUS
  Filled 2019-11-12: qty 5

## 2019-11-12 MED ORDER — SODIUM CHLORIDE 0.9% FLUSH
10.0000 mL | INTRAVENOUS | Status: DC | PRN
  Administered 2019-11-12: 10 mL via INTRAVENOUS
  Filled 2019-11-12: qty 10

## 2019-11-12 NOTE — Progress Notes (Signed)
The patient daughter reports after treatment the patient has diarrhea but better at this time.

## 2019-11-14 ENCOUNTER — Encounter: Payer: Self-pay | Admitting: Hematology and Oncology

## 2019-11-14 NOTE — Progress Notes (Signed)
Received call from MacArthur C requesting copy of [employer] auth letter for [employer] be sent to her and Brandi H via email.

## 2019-11-19 NOTE — Progress Notes (Signed)
Pharmacist Chemotherapy Monitoring - Follow Up Assessment    I verify that I have reviewed each item in the below checklist:  . Regimen for the patient is scheduled for the appropriate day and plan matches scheduled date. Marland Kitchen Appropriate non-routine labs are ordered dependent on drug ordered. . If applicable, additional medications reviewed and ordered per protocol based on lifetime cumulative doses and/or treatment regimen.   Plan for follow-up and/or issues identified: Yes . I-vent associated with next due treatment: Yes . MD and/or nursing notified: Yes  Adelina Mings 11/19/2019 11:18 AM

## 2019-11-24 ENCOUNTER — Ambulatory Visit: Payer: Medicare HMO

## 2019-11-24 NOTE — Progress Notes (Signed)
[facility]  [address], [address] [place], [place] [address] Phone: [phone]  Fax: [phone]   Clinic Day:  11/26/2019  Referring physician: Francesca Oman, DO  Chief Complaint: Toni Griffith is a 79 y.o. 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   HPI: The patient was last seen in the medical oncology clinic on 11/12/2019. At that time, she felt "fine" other than slight pain in her left knee. She denied tinnitus after her last treatment but admitted to diarrhea; her bowels had since returned to be normal.  She last took a magnesium pill on 11/11/2019. She no longer takes potassium. 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 She received cycle #4 of carboplatin, abraxane, and pembrolizumab.   Hematocrit was 31.7, hemoglobin 10.3, MCV 100.0, platelets 220,000, WBC 6200, ANC 4300. Magnesium was 1.7.   Symptomatcally, she feels good today. The ringing in her ears was back last night for about an hour. There was no headache associated with it. She felt stressed yesterday and was crying and that is when the tinnitus started. Her daughter says she does not like to be rushed because she gets easily stressed.   She has been drinking well. She notes she is having trouble eating; things do not taste the same. She admits to nausea and diarrhea. She last had diarrhea on Monday until yesterday. In the last two weeks she has had diarrhea 7-8 days. She denies any blood or mucus in her stool. Stool is incredibly watery. She has not been keeping a food diary but agrees to do so. After her last treatment on 11/14/2019, she had diarrhea. She took oral magnesium on 11/16/2019 and on 11/19/2019.  She says she usually gets diarrhea the next day after taking the magnesium. She is unsure if the magnesium and diarrhea are related.   Her energy levels are not normal. She is  more often "blah" than good. She usually feels good after her first day of treatment, but after that she begins to not feel as good. Her neuropathy in her left hand and the heel in her foot is the same. She says her left knee feels like she has a cast on it. It feels heavy. She has an appointment at [facility] on Friday, 11/28/2019, to evaluate her knee. She uses a walker to get around her house.    Past Medical History:  Diagnosis Date  . Arthritis   . Cancer [facility])    lymphoma-right hip  . Deaf   . GERD (gastroesophageal reflux disease)   . Hypertension    NOT ON MED AT PRESENT  . Squamous cell lung cancer [facility])     Past Surgical History:  Procedure Laterality Date  . CARDIAC SURGERY  1993  . CORONARY ARTERY BYPASS GRAFT     DOUBLE  . CYST REMOVAL HAND Right   . ENDOBRONCHIAL ULTRASOUND N/A 05/30/2018   Procedure: ENDOBRONCHIAL ULTRASOUND;  Surgeon: Tyler Pita, MD;  Location: [facility] ORS;  Service: Cardiopulmonary;  Laterality: N/A;  . PERIPHERAL VASCULAR CATHETERIZATION N/A 04/06/2016   Procedure: Glori Luis Cath Insertion;  Surgeon: Algernon Huxley, MD;  Location: [facility];  Service: Cardiovascular;  Laterality: N/A;    Family History  Problem Relation Age of Onset  . Lung cancer Mother   . Lung cancer Father   . Diabetes Maternal Uncle   . Myasthenia gravis Maternal Grandmother   .  Leukemia Maternal Grandfather     Social History:  reports that she quit smoking about 4 years ago. Her smoking use included cigarettes. She has a 80.00 pack-year smoking history. She has never used smokeless tobacco. She reports current alcohol use. She reports that she does not use drugs. She smoked 1 1/2 packs per day for 20 years (30 pack year smoking history) until 10/2015. She is hearing impaired (deaf). She lives in [place] with her boyfriend. Her daughter lives in [place]. Her daughter, Toni Griffith's contact number is [phone].She has 2 cats named Pepper and Baby. The patient is  accompanied by interpreter  and her daughter on the phone today.   Allergies: No Known Allergies  Current Medications: Current Outpatient Medications  Medication Sig Dispense Refill  . acetaminophen (TYLENOL) 500 MG tablet Take 500 mg by mouth every 6 (six) hours as needed for moderate pain.     Marland Kitchen allopurinol (ZYLOPRIM) 300 MG tablet TAKE 1 TABLET BY MOUTH EVERY DAY 90 tablet 0  . aspirin EC 81 MG tablet Take 81 mg by mouth daily.    Marland Kitchen b complex vitamins tablet Take 1 tablet by mouth daily.    Carin Hock Iron (IRON CHEWS PEDIATRIC PO) Take 5 mg by mouth daily.    . Cholecalciferol (VITAMIN D3) 2000 units capsule Take 1,000 Units by mouth daily.     . DULoxetine (CYMBALTA) 60 MG capsule     . lidocaine-prilocaine (EMLA) cream Apply cream 1 hour before chemotherapy treatment and place small peive of saran wrap over cream to protect clothing 30 g 3  . Omega-3 Fatty Acids (FISH OIL PO) Take 1 tablet by mouth daily.    . ondansetron (ZOFRAN ODT) 4 MG disintegrating tablet Take 1 tablet (4 mg total) by mouth every 8 (eight) hours as needed for nausea or vomiting. 30 tablet 1  . vitamin E 400 UNIT capsule Take 400 Units by mouth daily.     Marland Kitchen atorvastatin (LIPITOR) 80 MG tablet Take 80 mg by mouth every evening.     Marland Kitchen dextromethorphan 15 MG/5ML syrup Take 10 mLs by mouth 4 (four) times daily as needed for cough.    . magnesium oxide (MAG-OX) 400 (241.3 Mg) MG tablet Take 1 tablet (400 mg total) by mouth every other day. (Patient not taking: Reported on 11/26/2019) 30 tablet 0  . pantoprazole (PROTONIX) 40 MG tablet Take 40 mg by mouth daily.     . potassium chloride SA (KLOR-CON) 20 MEQ tablet Take 1 tablet (20 mEq total) by mouth daily. Take as directed by MD. (Patient not taking: Reported on 11/06/2019) 30 tablet 0   No current facility-administered medications for this visit.   Facility-Administered Medications Ordered in Other Visits  Medication Dose Route Frequency Provider Last Rate Last Admin   . heparin lock flush 100 unit/mL  500 Units Intravenous Once Jaceon Heiberger C, MD      . heparin lock flush 100 unit/mL  500 Units Intravenous Once Davelle Anselmi C, MD      . sodium chloride flush (NS) 0.9 % injection 10 mL  10 mL Intravenous PRN Nolon Stalls C, MD   10 mL at 04/05/17 1034  . sodium chloride flush (NS) 0.9 % injection 10 mL  10 mL Intravenous PRN Lequita Asal, MD   10 mL at 02/27/19 1033  . sodium chloride flush (NS) 0.9 % injection 10 mL  10 mL Intravenous PRN Lequita Asal, MD   10 mL at 11/26/19 [phone]  Review of Systems  Constitutional: Negative for chills, diaphoresis, fever, malaise/fatigue and weight loss.       Feels "blah".  Energy levels are low.  HENT: Positive for hearing loss (deaf) and tinnitus (last night ). Negative for congestion, ear discharge, ear pain and sore throat.   Eyes: Negative for blurred vision and double vision.  Respiratory: Negative for cough and shortness of breath.   Cardiovascular: Negative for chest pain, palpitations and leg swelling.  Gastrointestinal: Positive for diarrhea (incredibly watery ) and nausea. Negative for abdominal pain, blood in stool, constipation, heartburn, melena and vomiting.  Genitourinary: Negative for dysuria, frequency and urgency.  Musculoskeletal: Positive for back pain (lower back pain) and joint pain (knees; chronic. left knee > right knee). Negative for falls, myalgias and neck pain.  Skin: Negative for itching and rash.  Neurological: Positive for sensory change (baseline neuropathy unchanged; left hand 2nd and 3rd digits. Neuropathy in heel unchanged). Negative for dizziness, tingling, weakness and headaches.  Endo/Heme/Allergies: Does not bruise/bleed easily.  Psychiatric/Behavioral: Negative for depression and memory loss. The patient is nervous/anxious. The patient does not have insomnia.   All other systems reviewed and are negative.  Performance status (ECOG):   2  Vitals Blood pressure 102/72, pulse (!) 111, temperature (!) 96.8 F (36 C), temperature source Tympanic, resp. rate 20, weight 174 lb (78.9 kg), SpO2 97 %.   Physical Exam  Constitutional: She is oriented to person, place, and time. She appears well-developed and well-nourished. No distress. Face mask in place.  Patient examined in wheelchair secondary to decreased mobility.  HENT:  Head: Normocephalic and atraumatic.  Mouth/Throat: Oropharynx is clear and moist and mucous membranes are normal. No oral lesions. No oropharyngeal exudate.  Pink head wrap.  Eyes: Pupils are equal, round, and reactive to light. Conjunctivae and EOM are normal. Right eye exhibits no discharge. Left eye exhibits no discharge. No scleral icterus.  Hazel eyes.   Neck: No JVD present.  Cardiovascular: Normal rate, regular rhythm and normal heart sounds. Exam reveals no gallop and no friction rub.  No murmur heard. Pulmonary/Chest: Effort normal and breath sounds normal. She has no wheezes. She has no rhonchi. She has no rales.  Decreased breath sounds at the bases.  Abdominal: Soft. Normal appearance and bowel sounds are normal. She exhibits no mass. There is no hepatosplenomegaly. There is no abdominal tenderness. There is no CVA tenderness.  Musculoskeletal:        General: Tenderness (lower left knee) present. No edema. Normal range of motion.     Cervical back: Normal range of motion and neck supple.     Left knee: Swelling present.     Comments: Wearing a brace on her left knee.  Lymphadenopathy:       Head (right side): No preauricular, no posterior auricular and no occipital adenopathy present.       Head (left side): No preauricular, no posterior auricular and no occipital adenopathy present.    She has no cervical adenopathy.    She has no axillary adenopathy.       Right: No inguinal and no supraclavicular adenopathy present.       Left: No inguinal and no supraclavicular adenopathy present.   Neurological: She is alert and oriented to person, place, and time.  Skin: Skin is warm, dry and intact. No bruising, no lesion and no rash noted. She is not diaphoretic. No erythema. No pallor.  Psychiatric: She has a normal mood and affect. Her behavior is normal.  Judgment and thought content normal.  Nursing note and vitals reviewed.   Infusion on 11/26/2019  Component Date Value Ref Range Status  . Uric Acid, Serum 11/26/2019 3.4  2.5 - 7.1 mg/dL Final   Performed at [facility], [address] [place], [place] [address]  . LDH 11/26/2019 130  98 - 192 U/L Final   Performed at [facility], [address] [place], [place] [address]  . Magnesium 11/26/2019 1.7  1.7 - 2.4 mg/dL Final   Performed at [facility], [address], [place], [place] [address]  . Sodium 11/26/2019 136  135 - 145 mmol/L Final  . Potassium 11/26/2019 3.7  3.5 - 5.1 mmol/L Final  . Chloride 11/26/2019 103  98 - 111 mmol/L Final  . CO2 11/26/2019 24  22 - 32 mmol/L Final  . Glucose, Bld 11/26/2019 124* 70 - 99 mg/dL Final   Glucose reference range applies only to samples taken after fasting for at least 8 hours.  . BUN 11/26/2019 22  8 - 23 mg/dL Final  . Creatinine, Ser 11/26/2019 0.96  0.44 - 1.00 mg/dL Final  . Calcium 11/26/2019 8.9  8.9 - 10.3 mg/dL Final  . Total Protein 11/26/2019 7.0  6.5 - 8.1 g/dL Final  . Albumin 11/26/2019 3.2* 3.5 - 5.0 g/dL Final  . AST 11/26/2019 19  15 - 41 U/L Final  . ALT 11/26/2019 12  0 - 44 U/L Final  . Alkaline Phosphatase 11/26/2019 74  38 - 126 U/L Final  . Total Bilirubin 11/26/2019 0.7  0.3 - 1.2 mg/dL Final  . GFR calc non Af Amer 11/26/2019 57* >60 mL/min Final  . GFR calc Af Amer 11/26/2019 >60  >60 mL/min Final  . Anion gap 11/26/2019 9  5 - 15 Final   Performed at [facility], [address] [place], [place] [address]  . WBC 11/26/2019 6.3  4.0 - 10.5 K/uL Final  . RBC 11/26/2019 3.29* 3.87 - 5.11  MIL/uL Final  . Hemoglobin 11/26/2019 10.9* 12.0 - 15.0 g/dL Final  . HCT 11/26/2019 33.3* 36.0 - 46.0 % Final  . MCV 11/26/2019 101.2* 80.0 - 100.0 fL Final  . MCH 11/26/2019 33.1  26.0 - 34.0 pg Final  . MCHC 11/26/2019 32.7  30.0 - 36.0 g/dL Final  . RDW 11/26/2019 19.3* 11.5 - 15.5 % Final  . Platelets 11/26/2019 239  150 - 400 K/uL Final  . nRBC 11/26/2019 0.0  0.0 - 0.2 % Final  . Neutrophils Relative % 11/26/2019 58  % Final  . Neutro Abs 11/26/2019 3.7  1.7 - 7.7 K/uL Final  . Lymphocytes Relative 11/26/2019 20  % Final  . Lymphs Abs 11/26/2019 1.3  0.7 - 4.0 K/uL Final  . Monocytes Relative 11/26/2019 18  % Final  . Monocytes Absolute 11/26/2019 1.1* 0.1 - 1.0 K/uL Final  . Eosinophils Relative 11/26/2019 2  % Final  . Eosinophils Absolute 11/26/2019 0.1  0.0 - 0.5 K/uL Final  . Basophils Relative 11/26/2019 1  % Final  . Basophils Absolute 11/26/2019 0.1  0.0 - 0.1 K/uL Final  . Immature Granulocytes 11/26/2019 1  % Final  . Abs Immature Granulocytes 11/26/2019 0.06  0.00 - 0.07 K/uL Final   Performed at [facility], [address] [place],  [address]    Assessment:  Marnell Mcdaniel is a 79 y.o. female withstage IVB B-cell lymphoma, unclassifiable, with features intermediate between diffuse large B-cell  lymphoma and classical Hodgkin's lymphoma ("gray zone" lymphoma). She underwent right iliac wing bone/bone marrow biopsy on 03/08/2016.  PET scanon 03/03/2016 revealed a hypermetabolic left lower lobe mass, bilateral pleural and pulmonary parenchymal nodular metastasis, a large pleural lesion invading the anterior left chest wall. There was activity in the superior left ocular orbit. There was a large lesion centered in the right iliac wing with soft tissue extension. There were hypermetabolic right external iliac lymph nodes  Echoon 04/04/2016 revealed an EF of 55-60%. Hepatitis B and C testing was negative on 03/30/2016. G6PD assay was  normal. She declined LP with MTX prophylaxis.  She received 6 cycles of mini-RCHOP(04/07/2016 - 07/21/2016). She has had some transient fingertip numbness.  She received radiationto the right iliac wing, from 10/11/2016 - 11/01/2016. She received 3000 cGy over 3 weeks.  PET scanon 01/29/2017 revealed interval progression of hypermetabolic nodules in the right upper (13 mm compared to 9 mm; SUV 10) and left lower lobes (26 mm compared to 22 mm; SUV 7.5). There was new hypermetabolic focus of activity along the anterior left pleura although no underlying pleural or lung mass was evident.  She has squamous cell lung cancer. Foundation Oneon the lung biopsy on 03/15/2018 revealed MS-stable, tumor mutational burden 5 Muts/Mb, AKT2 amplification, CCNE1 amplification, CREBBP R1446L, KDR amplification, KIT amplification, PDGFRA amplification, PTEN loss exons 2-4, and AJ68 splice site [id number]. Marland Kitchen There were no alterations in EGFR, KRAS, RET, ALK, MET, ERBB2, BRAF, ROS1. PDL-1IHC analysis revealed a TPS score of 50%.  CT guided left lower lobe nodule biopsyon 02/19/2017 confirmed squamous cell carcinoma. She underwent SBRT08/27/2018 - 04/09/2017. She received 5000 cGy in 5 fractions to the left lower lobe lesion. She underwent SBRTto a RUL lesion from 05/23/2017 - 06/18/2017.  CT guided left upper lobe nodule biopsyon 02/20/2018 revealedsquamous cell lung cancer. She received SBRTfrom 04/09/2018 - 04/15/2018. She received 6000 cGy in 5 fractions.   Bronchoscopyon 05/30/2018 revealed no endobronchial lesions. The 2 cm pre-carinal lymph node was biopsied. Pathology revealed metastatic non-small cell carcinoma, favor squamous cell carcinoma.  CT guided right paraspinal lesion biopsyon 10/16/2017 was compatible with involvement by the patient's previously diagnosed lymphoma. The case was referred to [facility] hematopathology. The neoplastic cells were largely CD30 negative. Repeat  CD30 immunohistochemistry faintly stained >10% of the large atypical cells. CD20 stained the scattered large atypical cells and few small cells.   She received 3 cycles of brentuximab vedotin(11/23/2017 - 01/11/2018).Her lymphoma progressed.  She is s/p14cycles of pembrolizumab(06/14/2018 -05/21/2019). She received a steroid Dosepak on 01/09/2019 for minor chest CT changes felt secondary to pembrolizumab.  She is cycle #4 pembolizumab, carboplatin and Abraxane (08/29/2019 - 11/05/2019). She experienced tinnitus after her first day of chemotherapy. Tinnitus was minor with cycle #2.  Chest, abdomen, and pelvis CTon 06/06/2019 revealed an interval mass-like opacity in the superior aspect of the left parahilar lungwith an interval more prominent mass-like component involving the pleura laterally at the level with slightly more prominent extension into the left hila region, suspicious for recurrent or progressive lung cancer. There was mediastinal and bilateral hilar adenopathyc/wmetastatic adenopathy or involvement with the patient's B-cell lymphoma. There was no evidence of metastatic disease or lymphoma involving the abdomen or pelvis. There was significantly improved ground-glass opacities in the right lung, minimal left pleural fluid(improved), andastable large hiatal hernia.There was stable enlarged thyroid gland containing heterogeneous nodules with substernal extension on the left.There was stable to slightly decreased size of the previously demonstrated right adrenal nodule and no significant change  in a small, poorly visualized left adrenal nodule.  PET scanon 06/17/2019 revealed a known left parahilar lung mass markedly hypermetabolic with associated hypermetabolic lymphadenopathy in both hilar regions, the mediastinum, andthe right thoracic inlet. There was hypermetabolism in the right submandibular gland, indeterminate. There was no hypermetabolic metastatic disease  evident in the abdomen or pelvis. There was a stable bilateral adrenal adenomas since 01/29/2018. There were tiny left pleural effusion and a large hiatal hernia.There was a segment of transverse colon extendingup into the hiatal hernia without obstructive features.   Shereceived4000cGyto the left hilar regionfrom12/14/2020 -08/15/2019.  She hasiron deficiency anemia. Ferritinhas been followed: 14 on 05/06/2018,23 on 07/26/2018, and 59 on 11/01/2018. She denies any bleeding.  She has a grade I neuropathy. B12 and folate were normal on 12/06/2017. She has severe bilateral knee arthritis. She has received steroid injections and Monovisc (hyaluronate derivatives) without improvement.   She is deafand requires an interpreter.  Symptomatically, energy level is "blah".  She has had watery diarrhea.  She has chronic knee pain.  Plan: 1.   Labs today:CBC with diff, CMP, Mg.  2.Gray zone lymphoma Clinically,she denies any B symptomsl. Exam reveals no adenopathy or hepatosplenomegaly. She has received 6 cycles of mini-RCOP, 3 cycles ofbrentuximab vedotin, and 16cycles of pembrolizumab. PET scan on 06/17/2019 revealed progressive disease in the chest felt secondary toprogressive lung cancer. She is s/p 5 additional cycle of maintenance pembrolizumab (last 11/05/2019). Unclear if diarrhea related to pembrolizumab. Hold treatment this week. 3. Clinical stage [id number] (stage IIIC) squamous cell lung cancer Clinically, she appears to be doing well. Patient is s/p SBRT to left upper lobe and left lower lobe nodules. Patient is s/p 15cyclesof pembrolizumab. Patient is s/p palliative radiation to the right hilar region (completed 08/15/2019). She is s/p 4 cycles of carboplatin and Abraxane (08/29/2019 - 11/05/2019). Abraxane 100 mg/m2 IV on days 1,8, and 15. Carboplatin AUC 5 on day  1. Cycle length every 21 days. Pembrolizumab incorporated for maintenance of gray zone lymphoma on day 1 She experienced significant tinnitus with cycle #1, minimal tinnitus with cycle #2, and transiently after cycle #4. Discuss plan for repeat imaging prior to further cycles of treatment. Discuss symptom management.  She has antiemetics at home to use on a prn bases.  Interventions are adequate.    4.Chemotherapy-induced neuropathy Neuropathy appears stable in hands and feet. Continue to monitor on Abraxane. 5. Diarrhea  Etiology unclear, but possibly related to oral magnesium.  Differential includes dietary issues, infection or pembolizumab.  Check stool studies. 6.   B12 deficiency She is on oral B12. B12 and folate were normal on 12/17/2018. Monitor annually. 7.Bilateral knee pain She has baseline arthritis which may be exacerbated by pembrolizumab. Shedeclinedsteroid injections. She receives Tylenol on chemotherapy days for her knee pain. Patient to be seen in the rheumatology clinic on 11/28/2019. 8. Hypomagnesemia, resolved Magnesium 1.7.  Avoid oral magnesium secondary contrast to concerns about patient's diarrhea. 9.Stool studies. 10.   Anticipate CT scans prior to next visit (will call with appt). 11.   RTC in 1 week for MD assessment, labs (CBC with diff, CMP, Mg), review of imaging studies and +/- cycle #5 carboplatin, Abraxane, pembrolizumab.  I discussed the assessment and treatment plan with the patient.  The patient was provided an opportunity to ask questions and all were answered.  The patient agreed with the plan and demonstrated an understanding of the instructions.  The patient was advised to call back if the symptoms worsen or if the condition  fails to improve as anticipated.  I provided 23 minutes (10:14 AM - 10:37 AM) of  face-to-face time during this this encounter and > 50% was spent counseling as documented under my assessment and plan.    Lequita Asal, MD, PhD    11/26/2019, 10:14 AM  I, Heywood Footman, am acting as Education administrator for [employer]. Mike Gip, MD, PhD.  I, Isair Inabinet C. Mike Gip, MD, have reviewed the above documentation for accuracy and completeness, and I agree with the above.

## 2019-11-26 ENCOUNTER — Other Ambulatory Visit: Payer: Self-pay

## 2019-11-26 ENCOUNTER — Inpatient Hospital Stay: Payer: Medicare HMO

## 2019-11-26 ENCOUNTER — Encounter: Payer: Self-pay | Admitting: Hematology and Oncology

## 2019-11-26 ENCOUNTER — Inpatient Hospital Stay: Payer: Medicare HMO | Admitting: Hematology and Oncology

## 2019-11-26 VITALS — BP 102/72 | HR 111 | Temp 96.8°F | Resp 20 | Wt 174.0 lb

## 2019-11-26 DIAGNOSIS — C7951 Secondary malignant neoplasm of bone: Secondary | ICD-10-CM | POA: Diagnosis not present

## 2019-11-26 DIAGNOSIS — Z5112 Encounter for antineoplastic immunotherapy: Secondary | ICD-10-CM | POA: Diagnosis not present

## 2019-11-26 DIAGNOSIS — Z5111 Encounter for antineoplastic chemotherapy: Secondary | ICD-10-CM

## 2019-11-26 DIAGNOSIS — T451X5A Adverse effect of antineoplastic and immunosuppressive drugs, initial encounter: Secondary | ICD-10-CM

## 2019-11-26 DIAGNOSIS — C8518 Unspecified B-cell lymphoma, lymph nodes of multiple sites: Secondary | ICD-10-CM

## 2019-11-26 DIAGNOSIS — C3490 Malignant neoplasm of unspecified part of unspecified bronchus or lung: Secondary | ICD-10-CM

## 2019-11-26 DIAGNOSIS — G620 Drug-induced polyneuropathy: Secondary | ICD-10-CM

## 2019-11-26 DIAGNOSIS — R197 Diarrhea, unspecified: Secondary | ICD-10-CM

## 2019-11-26 DIAGNOSIS — E876 Hypokalemia: Secondary | ICD-10-CM

## 2019-11-26 DIAGNOSIS — E538 Deficiency of other specified B group vitamins: Secondary | ICD-10-CM

## 2019-11-26 LAB — COMPREHENSIVE METABOLIC PANEL
ALT: 12 U/L (ref 0–44)
AST: 19 U/L (ref 15–41)
Albumin: 3.2 g/dL — ABNORMAL LOW (ref 3.5–5.0)
Alkaline Phosphatase: 74 U/L (ref 38–126)
Anion gap: 9 (ref 5–15)
BUN: 22 mg/dL (ref 8–23)
CO2: 24 mmol/L (ref 22–32)
Calcium: 8.9 mg/dL (ref 8.9–10.3)
Chloride: 103 mmol/L (ref 98–111)
Creatinine, Ser: 0.96 mg/dL (ref 0.44–1.00)
GFR calc Af Amer: >60 mL/min (ref >60)
GFR calc non Af Amer: 57 mL/min — ABNORMAL LOW (ref >60)
Glucose, Bld: 124 mg/dL — ABNORMAL HIGH (ref 70–99)
Potassium: 3.7 mmol/L (ref 3.5–5.1)
Sodium: 136 mmol/L (ref 135–145)
Total Bilirubin: 0.7 mg/dL (ref 0.3–1.2)
Total Protein: 7 g/dL (ref 6.5–8.1)

## 2019-11-26 LAB — CBC WITH DIFFERENTIAL/PLATELET
Abs Immature Granulocytes: 0.06 10*3/uL (ref 0.00–0.07)
Basophils Absolute: 0.1 10*3/uL (ref 0.0–0.1)
Basophils Relative: 1 %
Eosinophils Absolute: 0.1 10*3/uL (ref 0.0–0.5)
Eosinophils Relative: 2 %
HCT: 33.3 % — ABNORMAL LOW (ref 36.0–46.0)
Hemoglobin: 10.9 g/dL — ABNORMAL LOW (ref 12.0–15.0)
Immature Granulocytes: 1 %
Lymphocytes Relative: 20 %
Lymphs Abs: 1.3 10*3/uL (ref 0.7–4.0)
MCH: 33.1 pg (ref 26.0–34.0)
MCHC: 32.7 g/dL (ref 30.0–36.0)
MCV: 101.2 fL — ABNORMAL HIGH (ref 80.0–100.0)
Monocytes Absolute: 1.1 10*3/uL — ABNORMAL HIGH (ref 0.1–1.0)
Monocytes Relative: 18 %
Neutro Abs: 3.7 10*3/uL (ref 1.7–7.7)
Neutrophils Relative %: 58 %
Platelets: 239 10*3/uL (ref 150–400)
RBC: 3.29 MIL/uL — ABNORMAL LOW (ref 3.87–5.11)
RDW: 19.3 % — ABNORMAL HIGH (ref 11.5–15.5)
WBC: 6.3 10*3/uL (ref 4.0–10.5)
nRBC: 0 % (ref 0.0–0.2)

## 2019-11-26 LAB — LACTATE DEHYDROGENASE: LDH: 130 U/L (ref 98–192)

## 2019-11-26 LAB — URIC ACID: Uric Acid, Serum: 3.4 mg/dL (ref 2.5–7.1)

## 2019-11-26 LAB — MAGNESIUM: Magnesium: 1.7 mg/dL (ref 1.7–2.4)

## 2019-11-26 MED ORDER — SODIUM CHLORIDE 0.9% FLUSH
10.0000 mL | INTRAVENOUS | Status: DC | PRN
  Administered 2019-11-26: 09:00:00 10 mL via INTRAVENOUS
  Filled 2019-11-26: qty 10

## 2019-11-26 MED ORDER — HEPARIN SOD (PORK) LOCK FLUSH 100 UNIT/ML IV SOLN
500.0000 [IU] | Freq: Once | INTRAVENOUS | Status: AC
  Administered 2019-11-26: 500 [IU] via INTRAVENOUS
  Filled 2019-11-26: qty 5

## 2019-11-26 NOTE — Progress Notes (Signed)
Patient reports knee pain and heaviness. Pain with neuropathy. The ringing in her ears started back again last night but it has improved this morning. Off and on diarrhea.She is not taking her oral magnesium or potassium.

## 2019-11-26 NOTE — Progress Notes (Signed)
Pharmacist Chemotherapy Monitoring - Follow Up Assessment    I verify that I have reviewed each item in the below checklist:  . Regimen for the patient is scheduled for the appropriate day and plan matches scheduled date. Marland Kitchen Appropriate non-routine labs are ordered dependent on drug ordered. . If applicable, additional medications reviewed and ordered per protocol based on lifetime cumulative doses and/or treatment regimen.   Plan for follow-up and/or issues identified: No . I-vent associated with next due treatment: No . MD and/or nursing notified: No  Chrishelle Zito K 11/26/2019 11:53 AM

## 2019-11-26 NOTE — Patient Instructions (Signed)
Patient sent home with supplies to obtain a stool specimen. Instructions given seperately to patient and then again to her daughter [name] on storage of specimen and hours to return stool sample.

## 2019-11-28 NOTE — Progress Notes (Signed)
[facility]  [address] [place], [place] [address] Phone: [phone]  Fax: [phone]   Clinic Day:  12/03/2019  Referring physician: Francesca Oman, DO  Chief Complaint: Toni Griffith is a 79 y.o. 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   HPI: The patient was last seen in the medical oncology clinic on 11/26/2019. At that time, energy level was "blah".  She had watery diarrhea.  She has chronic knee pain.  Exam revealed decreased breath sounds at the bases.  Hematocrit 33.3, hemoglobin 10.9, MCV 101.2, platelets 239,000, WBC 6,300, monocyte count was 1,100. Uric acid and LDH were normal. Decision was made to postpone treatment until follow-up CT and evaluate her diarrhea.  She saw Dr. Harrel Carina in [facility] rheumatology on 04/30/20201 for chronic pain bilateral knee pain. She is felt to have bilateral knee osteoarthritis. Pain worsens with activity and improves with rest. She received a corticosteroid injection in both knees.  Chest, abdomen and pelvis CT on 12/02/2019 revealed interval resolution of mediastinal and hilar adenopathy c/w response to therapy. 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 There was increased moderate-sized dependent left pleural effusion with increased left lower lobe compressive atelectasis. There was a new 6 mm nodule in the superior segment of the right lower lobe, nonspecific, but potentially a metastasis. Attention on follow-up was recommended.  There was no evidence of metastatic disease in the abdomen or pelvis.  There was a moderate-sized hiatal hernia and bilateral adrenal adenomas.   During the interim, she has felt "fine". Last night she felt nauseated. She felt like she had needed to vomit.  Her son notes she has some hot flashes. She had no more diarrhea and did not turn a stool sample. Her bowels are normal. She has exertional shortness of breath and a cough. He son notes she has a few spells of coughing. He son notes that she takes quick short breaths at times. He feels like she is having trouble breathing.   Her bones and joint pain are unchanged s/p corticosteroid injection. He son notes it will take about 2-3 weeks for the corticosteroid injection to work. She will follow up with rheumatology in 6 months but can get an injection at any time. She has left sided rib pain. Her son notes putting patches on her left sided ribs to relieve pain.   After reviewing scans the patient and her family agreed to a diagnostic and therapeutic thoracentesis.    Past Medical History:  Diagnosis Date  . Arthritis   . Cancer [facility])    lymphoma-right hip  . Deaf   . GERD (gastroesophageal reflux disease)   . Hypertension    NOT ON MED AT PRESENT  . Squamous cell lung cancer [facility])     Past Surgical History:  Procedure Laterality Date  . CARDIAC SURGERY  1993  . CORONARY ARTERY BYPASS GRAFT     DOUBLE  . CYST REMOVAL HAND Right   . ENDOBRONCHIAL ULTRASOUND N/A 05/30/2018   Procedure: ENDOBRONCHIAL ULTRASOUND;  Surgeon: Tyler Pita, MD;  Location: [facility] ORS;  Service: Cardiopulmonary;  Laterality: N/A;  . PERIPHERAL VASCULAR CATHETERIZATION N/A 04/06/2016   Procedure: Glori Luis Cath Insertion;  Surgeon: Algernon Huxley, MD;  Location: [facility];  Service: Cardiovascular;  Laterality: N/A;    Family History  Problem Relation Age of Onset  . Lung cancer Mother   . Lung cancer Father   . Diabetes Maternal Uncle   . Myasthenia gravis Maternal Grandmother   . Leukemia Maternal Grandfather     Social History:  reports that she quit smoking about 4 years ago. Her smoking use included cigarettes. She has a 80.00 pack-year smoking history. She has never used smokeless tobacco. She  reports current alcohol use. She reports that she does not use drugs. She smoked 1 1/2 packs per day for 20 years (30 pack year smoking history) until 10/2015. She is hearing impaired (deaf). She lives in [place] with her boyfriend. Her daughter lives in [place]. Her daughter, [name]'s contact number is [phone].She has 2 cats named Pepper and Baby.  The patient is accompanied by interpreter Carline, her son and daughter today.  Allergies: No Known Allergies  Current Medications: Current Outpatient Medications  Medication Sig Dispense Refill  . acetaminophen (TYLENOL) 500 MG tablet Take 500 mg by mouth every 6 (six) hours as needed for moderate pain.     Marland Kitchen allopurinol (ZYLOPRIM) 300 MG tablet TAKE 1 TABLET BY MOUTH EVERY DAY 90 tablet 0  . aspirin EC 81 MG tablet Take 81 mg by mouth daily.    Marland Kitchen atorvastatin (LIPITOR) 80 MG tablet Take 80 mg by mouth every evening.     Marland Kitchen b complex vitamins tablet Take 1 tablet by mouth daily.    Carin Hock Iron (IRON CHEWS PEDIATRIC PO) Take 5 mg by mouth daily.    . Cholecalciferol (VITAMIN D3) 2000 units capsule Take 1,000 Units by mouth daily.     Marland Kitchen dextromethorphan 15 MG/5ML syrup Take 10 mLs by mouth 4 (four) times daily as needed for cough.    . DULoxetine (CYMBALTA) 60 MG capsule     . lidocaine-prilocaine (EMLA) cream Apply cream 1 hour before chemotherapy treatment and place small peive of saran wrap over cream to protect clothing 30 g 3  . Omega-3 Fatty Acids (FISH OIL PO) Take 1 tablet by mouth daily.    . ondansetron (ZOFRAN ODT) 4 MG disintegrating tablet Take 1 tablet (4 mg total) by mouth every 8 (eight) hours as needed for nausea or vomiting. 30 tablet 1  . pantoprazole (PROTONIX) 40 MG tablet Take 40 mg by mouth daily.     . vitamin E 400 UNIT capsule Take 400 Units by mouth daily.     . magnesium oxide (MAG-OX) 400 (241.3 Mg) MG tablet Take 1 tablet (400 mg total) by mouth every other day. (Patient not taking: Reported on 11/26/2019) 30  tablet 0  . potassium chloride SA (KLOR-CON) 20 MEQ tablet Take 1 tablet (20 mEq total) by mouth daily. Take as directed by MD. (Patient not taking: Reported on 11/06/2019) 30 tablet 0   No current facility-administered medications for this visit.   Facility-Administered Medications Ordered in Other Visits  Medication Dose Route Frequency Provider Last Rate Last Admin  . acetaminophen (TYLENOL) tablet 650 mg  650 mg Oral Once Tierria Watson C, MD      . heparin lock flush 100 unit/mL  500 Units Intravenous Once Emma Birchler C, MD      . sodium chloride flush (NS) 0.9 % injection 10 mL  10 mL Intravenous PRN Nolon Stalls C, MD   10 mL at 04/05/17 1034  . sodium chloride flush (NS) 0.9 % injection 10 mL  10 mL Intravenous PRN Dashea Mcmullan, Drue Second, MD   10 mL at  02/27/19 1033    Review of Systems  Constitutional: Positive for weight loss (3 lbs). Negative for chills, fever and malaise/fatigue.       Feels "fine".  HENT: Positive for hearing loss (deaf). Negative for congestion, ear discharge, ear pain, nosebleeds, sinus pain, sore throat and tinnitus.   Eyes: Negative for blurred vision, double vision and photophobia.  Respiratory: Positive for cough and shortness of breath (extertional). Negative for hemoptysis and sputum production.   Cardiovascular: Negative for chest pain, palpitations and leg swelling.  Gastrointestinal: Positive for nausea (last night). Negative for abdominal pain, blood in stool, constipation, diarrhea (resolved), heartburn, melena and vomiting.       Normal bowels.  Genitourinary: Negative for dysuria, frequency and urgency.  Musculoskeletal: Positive for back pain (lower back) and joint pain (chronic; knees (L>R); osteoarthritis; s/p corticosteroid injection). Negative for myalgias.  Skin: Negative for itching and rash.  Neurological: Positive for sensory change (baseline neuropathy unchanged; left hand 2nd and 3rd digits. Neuropathy in sole of feet  unchanged). Negative for dizziness, tingling, weakness and headaches.  Endo/Heme/Allergies: Does not bruise/bleed easily.       Hot flashes at times.  Psychiatric/Behavioral: Negative for depression and memory loss. The patient is not nervous/anxious and does not have insomnia.   All other systems reviewed and are negative.   Performance status (ECOG):  2  Vitals Blood pressure 127/83, pulse (!) 103, temperature (!) 96.9 F (36.1 C), temperature source Tympanic, resp. rate 18, height '5\' 3"'$  (1.6 m), weight 171 lb 3.2 oz (77.7 kg), SpO2 97 %.   Physical Exam  Constitutional: She is oriented to person, place, and time. She appears well-developed and well-nourished. No distress.  Patient examined in wheelchair secondary to decreased mobility of her knees.   HENT:  Head: Normocephalic and atraumatic.  Mouth/Throat: Oropharynx is clear and moist. No oropharyngeal exudate.  Short gray hair.  Mask.  Eyes: Pupils are equal, round, and reactive to light. Conjunctivae and EOM are normal. No scleral icterus.  Hazel eyes.  Cardiovascular: Normal rate, regular rhythm and normal heart sounds.  No murmur heard. Pulmonary/Chest: Effort normal. No respiratory distress. She has no wheezes. She has no rales. She exhibits no tenderness.  Tenderness overlying left lower lobe ribs.  Decreased breath sounds one third the way up on the left.  Abdominal: Soft. Bowel sounds are normal. She exhibits no distension and no mass. There is no hepatosplenomegaly. There is no abdominal tenderness. There is no rebound and no guarding.  Musculoskeletal:        General: No tenderness or edema. Normal range of motion.     Cervical back: Normal range of motion and neck supple.     Left knee: Swelling present.     Comments: Wearing a brace on her left knee.  Lymphadenopathy:       Head (right side): No preauricular, no posterior auricular and no occipital adenopathy present.       Head (left side): No preauricular, no  posterior auricular and no occipital adenopathy present.    She has no cervical adenopathy.    She has no axillary adenopathy.       Right: No inguinal and no supraclavicular adenopathy present.       Left: No inguinal and no supraclavicular adenopathy present.  Neurological: She is alert and oriented to person, place, and time.  Skin: Skin is warm and dry. No rash noted. She is not diaphoretic. No erythema. No pallor.  Psychiatric: She has a normal mood  and affect. Her behavior is normal. Judgment and thought content normal.  Nursing note and vitals reviewed.   Infusion on 12/03/2019  Component Date Value Ref Range Status  . WBC 12/03/2019 11.4* 4.0 - 10.5 K/uL Final  . RBC 12/03/2019 3.21* 3.87 - 5.11 MIL/uL Final  . Hemoglobin 12/03/2019 10.6* 12.0 - 15.0 g/dL Final  . HCT 12/03/2019 32.4* 36.0 - 46.0 % Final  . MCV 12/03/2019 100.9* 80.0 - 100.0 fL Final  . MCH 12/03/2019 33.0  26.0 - 34.0 pg Final  . MCHC 12/03/2019 32.7  30.0 - 36.0 g/dL Final  . RDW 12/03/2019 19.4* 11.5 - 15.5 % Final  . Platelets 12/03/2019 331  150 - 400 K/uL Final  . nRBC 12/03/2019 0.0  0.0 - 0.2 % Final  . Neutrophils Relative % 12/03/2019 73  % Final  . Neutro Abs 12/03/2019 8.4* 1.7 - 7.7 K/uL Final  . Lymphocytes Relative 12/03/2019 12  % Final  . Lymphs Abs 12/03/2019 1.3  0.7 - 4.0 K/uL Final  . Monocytes Relative 12/03/2019 13  % Final  . Monocytes Absolute 12/03/2019 1.5* 0.1 - 1.0 K/uL Final  . Eosinophils Relative 12/03/2019 1  % Final  . Eosinophils Absolute 12/03/2019 0.1  0.0 - 0.5 K/uL Final  . Basophils Relative 12/03/2019 0  % Final  . Basophils Absolute 12/03/2019 0.1  0.0 - 0.1 K/uL Final  . Immature Granulocytes 12/03/2019 1  % Final  . Abs Immature Granulocytes 12/03/2019 0.14* 0.00 - 0.07 K/uL Final   Performed at [facility], [address] [place], [place] [address]  . Sodium 12/03/2019 133* 135 - 145 mmol/L Final  . Potassium 12/03/2019 4.1  3.5 - 5.1 mmol/L  Final  . Chloride 12/03/2019 99  98 - 111 mmol/L Final  . CO2 12/03/2019 24  22 - 32 mmol/L Final  . Glucose, Bld 12/03/2019 111* 70 - 99 mg/dL Final   Glucose reference range applies only to samples taken after fasting for at least 8 hours.  . BUN 12/03/2019 21  8 - 23 mg/dL Final  . Creatinine, Ser 12/03/2019 0.87  0.44 - 1.00 mg/dL Final  . Calcium 12/03/2019 8.6* 8.9 - 10.3 mg/dL Final  . Total Protein 12/03/2019 7.0  6.5 - 8.1 g/dL Final  . Albumin 12/03/2019 3.2* 3.5 - 5.0 g/dL Final  . AST 12/03/2019 17  15 - 41 U/L Final  . ALT 12/03/2019 11  0 - 44 U/L Final  . Alkaline Phosphatase 12/03/2019 69  38 - 126 U/L Final  . Total Bilirubin 12/03/2019 0.9  0.3 - 1.2 mg/dL Final  . GFR calc non Af Amer 12/03/2019 >60  >60 mL/min Final  . GFR calc Af Amer 12/03/2019 >60  >60 mL/min Final  . Anion gap 12/03/2019 10  5 - 15 Final   Performed at [facility], [address] [place], [place] [address]    Assessment:  Johann Gascoigne is a 79 y.o. female withstage IVB B-cell lymphoma, unclassifiable, with features intermediate between diffuse large B-cell lymphoma and classical Hodgkin's lymphoma ("gray zone" lymphoma). She underwent right iliac wing bone/bone marrow biopsy on 03/08/2016.  PET scanon 03/03/2016 revealed a hypermetabolic left lower lobe mass, bilateral pleural and pulmonary parenchymal nodular metastasis, a large pleural lesion invading the anterior left chest wall. There was activity in the superior left ocular orbit. There was a large lesion centered in the right iliac wing with soft tissue extension. There were hypermetabolic right external iliac lymph nodes  [place]oon 04/04/2016 revealed an EF of 55-60%. Hepatitis B and C testing was negative on 03/30/2016. G6PD assay was normal. She declined LP with MTX prophylaxis.  She received 6 cycles of mini-RCHOP(04/07/2016 - 07/21/2016). She has had some transient fingertip numbness.  She received  radiationto the right iliac wing, from 10/11/2016 - 11/01/2016. She received 3000 cGy over 3 weeks.  PET scanon 01/29/2017 revealed interval progression of hypermetabolic nodules in the right upper (13 mm compared to 9 mm; SUV 10) and left lower lobes (26 mm compared to 22 mm; SUV 7.5). There was new hypermetabolic focus of activity along the anterior left pleura although no underlying pleural or lung mass was evident.  She has squamous cell lung cancer. Foundation Oneon the lung biopsy on 03/15/2018 revealed MS-stable, tumor mutational burden 5 Muts/Mb, AKT2 amplification, CCNE1 amplification, CREBBP R1446L, KDR amplification, KIT amplification, PDGFRA amplification, PTEN loss exons 2-4, and QQ76 splice site 195+0D>T. Marland Kitchen There were no alterations in EGFR, KRAS, RET, ALK, MET, ERBB2, BRAF, ROS1. PDL-1IHC analysis revealed a TPS score of 50%.  CT guided left lower lobe nodule biopsyon 02/19/2017 confirmed squamous cell carcinoma. She underwent SBRT08/27/2018 - 04/09/2017. She received 5000 cGy in 5 fractions to the left lower lobe lesion. She underwent SBRTto a RUL lesion from 05/23/2017 - 06/18/2017.  CT guided left upper lobe nodule biopsyon 02/20/2018 revealedsquamous cell lung cancer. She received SBRTfrom 04/09/2018 - 04/15/2018. She received 6000 cGy in 5 fractions.   Bronchoscopyon 05/30/2018 revealed no endobronchial lesions. The 2 cm pre-carinal lymph node was biopsied. Pathology revealed metastatic non-small cell carcinoma, favor squamous cell carcinoma.  CT guided right paraspinal lesion biopsyon 10/16/2017 was compatible with involvement by the patient's previously diagnosed lymphoma. The case was referred to [facility] hematopathology. The neoplastic cells were largely CD30 negative. Repeat CD30 immunohistochemistry faintly stained >10% of the large atypical cells. CD20 stained the scattered large atypical cells and few small cells.   She received 3 cycles of  brentuximab vedotin(11/23/2017 - 01/11/2018).Her lymphoma progressed.  She is s/p14cycles of pembrolizumab(06/14/2018 -05/21/2019). She received a steroid Dosepak on 01/09/2019 for minor chest CT changes felt secondary to pembrolizumab.  She iss/p cycle #4pembolizumab, carboplatin and Abraxane (08/29/2019 - 11/05/2019). She experienced tinnitus after her first day of chemotherapy. Tinnitus was minor with cycle #2.  Chest, abdomen, and pelvis CTon 06/06/2019 revealed an interval mass-like opacity in the superior aspect of the left parahilar lungwith an interval more prominent mass-like component involving the pleura laterally at the level with slightly more prominent extension into the left hila region, suspicious for recurrent or progressive lung cancer. There was mediastinal and bilateral hilar adenopathyc/wmetastatic adenopathy or involvement with the patient's B-cell lymphoma. There was no evidence of metastatic disease or lymphoma involving the abdomen or pelvis. There was significantly improved ground-glass opacities in the right lung, minimal left pleural fluid(improved), andastable large hiatal hernia.There was stable enlarged thyroid gland containing heterogeneous nodules with substernal extension on the left.There was stable to slightly decreased size of the previously demonstrated right adrenal nodule and no significant change in a small, poorly visualized left adrenal nodule.  PET scanon 06/17/2019 revealed a known left parahilar lung mass markedly hypermetabolic with associated hypermetabolic lymphadenopathy in both hilar regions, the mediastinum, andthe right thoracic inlet. There was hypermetabolism in the right submandibular gland, indeterminate. There was no hypermetabolic metastatic disease evident in the abdomen or pelvis. There was a stable bilateral adrenal adenomas since 01/29/2018. There were tiny left pleural effusion and a large hiatal hernia.There was a  segment  of transverse colon extendingup into the hiatal hernia without obstructive features.   Shereceived4000cGyto the left hilar regionfrom12/14/2020 -08/15/2019.  Chest, abdomen and pelvis CT on 12/02/2019 revealed interval resolution of mediastinal and hilar adenopathy c/w response to therapy. 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 There was increased moderate-sized dependent left pleural effusion with increased left lower lobe compressive atelectasis. There was a new 6 mm nodule in the superior segment of the right lower lobe, nonspecific, but potentially a metastasis. Attention on follow-up was recommended.  There was no evidence of metastatic disease in the abdomen or pelvis.  There was a moderate-sized hiatal hernia and bilateral adrenal adenomas.   She hasiron deficiency anemia. Ferritinhas been followed: 14 on 05/06/2018,23 on 07/26/2018, and 59 on 11/01/2018. She denies any bleeding.  She has a grade I neuropathy. B12 and folate were normal on 12/06/2017. She has severe bilateral knee osteoarthritis. She has received steroid injections and Monovisc (hyaluronate derivatives) without improvement.   She is deafand requires an interpreter.  Symptomatically, she has months of breath on exertion.  Bowel movements are normal.  She denies any nausea.  Exam reveals decreased breath sounds one third the way up on the left with some rib tenderness in the lower lobe.  Plan: 1.   Labs today:CBC with diff, CMP, Mg. 2.Gray zone lymphoma Clinically,he denies any fevers, sweats or weight loss. Exam  no adenopathy or hepatosplenomegaly.   She has received 6 cycles of mini-RCOP, 3 cycles ofbrentuximab vedotin, and 16cycles of pembrolizumab. PET scan on 06/17/2019 revealed progressive disease in the chest felt secondary toprogressive lung cancer. She is s/p 5  additional cycle of maintenance pembrolizumab (last 11/05/2019).  Diarrhea has resolved. Chest, abdomen and pelvis CT scan on 12/02/2019 was personally reviewed.  Agree with radiology.  She has a moderate size left-sided pleural effusion.   Discuss concern for recurrent lung cancer.   Discuss postponing treatment this week until etiology of pleural effusion determined. 3. Clinical stage T4N3Mx (stage IIIC) squamous cell lung cancer Clinically, she notes increased shortness of breath. Patient is s/p SBRT to left upper lobe and left lower lobe nodules. Patient is s/p 15cyclesof pembrolizumab. Patient is s/p palliative radiation to the right hilar region (completed 08/15/2019). She is s/p 4 cycles of carboplatin and Abraxane (08/29/2019 - 11/05/2019). She experienced significant tinnitus with cycle #1, minimal tinnitus with cycle #2, and transiently after cycle #4. Review interval chest, abdomen and pelvis CT scan on 12/02/2019.   Discuss concern for left-sided effusion on the same side as prior to lung cancer.  Discuss diagnostic and therapeutic thoracentesis.  Postpone therapy until etiology is determined.    4.Left pleural effusion  Discuss concern for aggressive disease.  Given location of effusion and prior history of left-sided squamous cell lung cancer, discuss concern for recurrent disease.  Discuss diagnostic and therapeutic thoracentesis  Send fluid for cell count and diff, glucose, protein, and cytology. 5.   Chemotherapy-induced neuropathy Neuropathy appears stable. Continue to monitor. 6.   B12 deficiency She remains on oral B12. B12 and folate were normal on 12/17/2018. Check labs annually. 7.Bilateral knee pain She has baseline arthritis which may be exacerbated by pembrolizumab. She was seen by rheumatology during the interim and received a steroid injection. Continue to  monitor. 8. Postpone treatment today. 9.   Thoracentesis on 12/05/2019. 10.   RTC in 1 week (at least 3 days after procedure) for review of results, discussion regarding direction of therapy, +/-  labs (CBC with diff, CMP), and +/- treatment (carboplatin, Abraxane, pembrolizumab).  I discussed the assessment and treatment plan with the patient.  The patient was provided an opportunity to ask questions and all were answered.  The patient agreed with the plan and demonstrated an understanding of the instructions.  The patient was advised to call back if the symptoms worsen or if the condition fails to improve as anticipated.  I provided 32 minutes of face-to-face time during this encounter and > 50% was spent counseling as documented under my assessment and plan.  An additional 10 minutes were spent reviewing her chart (Epic and Care Everywhere) including notes, labs, and imaging studies.    Lequita Asal, MD, PhD    12/03/2019, 10:30 AM  I, Selena Batten, am acting as scribe for [employer]. Mike Gip, MD, PhD.  I, Derriona Branscom C. Mike Gip, MD, have reviewed the above documentation for accuracy and completeness, and I agree with the above.

## 2019-12-02 ENCOUNTER — Ambulatory Visit
Admission: RE | Admit: 2019-12-02 | Discharge: 2019-12-02 | Disposition: A | Discharge status: Home / Self Care | Payer: Medicare HMO | Source: Ambulatory Visit | Attending: Hematology and Oncology | Admitting: Hematology and Oncology

## 2019-12-02 ENCOUNTER — Other Ambulatory Visit: Payer: Self-pay

## 2019-12-02 DIAGNOSIS — C8518 Unspecified B-cell lymphoma, lymph nodes of multiple sites: Secondary | ICD-10-CM | POA: Diagnosis not present

## 2019-12-02 DIAGNOSIS — C3490 Malignant neoplasm of unspecified part of unspecified bronchus or lung: Secondary | ICD-10-CM | POA: Diagnosis present

## 2019-12-02 MED ORDER — IOHEXOL 300 MG/ML  SOLN
100.0000 mL | Freq: Once | INTRAMUSCULAR | Status: AC | PRN
  Administered 2019-12-02: 100 mL via INTRAVENOUS

## 2019-12-03 ENCOUNTER — Telehealth: Payer: Self-pay

## 2019-12-03 ENCOUNTER — Inpatient Hospital Stay: Payer: Medicare HMO

## 2019-12-03 ENCOUNTER — Inpatient Hospital Stay: Payer: Medicare HMO | Attending: Hematology and Oncology | Admitting: Hematology and Oncology

## 2019-12-03 ENCOUNTER — Encounter: Payer: Self-pay | Admitting: Hematology and Oncology

## 2019-12-03 VITALS — BP 127/83 | HR 103 | Temp 96.9°F | Resp 18 | Ht 63.0 in | Wt 171.2 lb

## 2019-12-03 DIAGNOSIS — D509 Iron deficiency anemia, unspecified: Secondary | ICD-10-CM | POA: Insufficient documentation

## 2019-12-03 DIAGNOSIS — G620 Drug-induced polyneuropathy: Secondary | ICD-10-CM | POA: Diagnosis not present

## 2019-12-03 DIAGNOSIS — J189 Pneumonia, unspecified organism: Secondary | ICD-10-CM | POA: Insufficient documentation

## 2019-12-03 DIAGNOSIS — E538 Deficiency of other specified B group vitamins: Secondary | ICD-10-CM | POA: Diagnosis not present

## 2019-12-03 DIAGNOSIS — T451X5A Adverse effect of antineoplastic and immunosuppressive drugs, initial encounter: Secondary | ICD-10-CM | POA: Diagnosis not present

## 2019-12-03 DIAGNOSIS — J90 Pleural effusion, not elsewhere classified: Secondary | ICD-10-CM | POA: Insufficient documentation

## 2019-12-03 DIAGNOSIS — D75A Glucose-6-phosphate dehydrogenase (G6PD) deficiency without anemia: Secondary | ICD-10-CM | POA: Diagnosis not present

## 2019-12-03 DIAGNOSIS — R110 Nausea: Secondary | ICD-10-CM | POA: Diagnosis not present

## 2019-12-03 DIAGNOSIS — K449 Diaphragmatic hernia without obstruction or gangrene: Secondary | ICD-10-CM | POA: Insufficient documentation

## 2019-12-03 DIAGNOSIS — C3432 Malignant neoplasm of lower lobe, left bronchus or lung: Secondary | ICD-10-CM | POA: Diagnosis present

## 2019-12-03 DIAGNOSIS — Z833 Family history of diabetes mellitus: Secondary | ICD-10-CM | POA: Insufficient documentation

## 2019-12-03 DIAGNOSIS — C3490 Malignant neoplasm of unspecified part of unspecified bronchus or lung: Secondary | ICD-10-CM

## 2019-12-03 DIAGNOSIS — G893 Neoplasm related pain (acute) (chronic): Secondary | ICD-10-CM | POA: Insufficient documentation

## 2019-12-03 DIAGNOSIS — H9190 Unspecified hearing loss, unspecified ear: Secondary | ICD-10-CM | POA: Insufficient documentation

## 2019-12-03 DIAGNOSIS — E049 Nontoxic goiter, unspecified: Secondary | ICD-10-CM | POA: Diagnosis not present

## 2019-12-03 DIAGNOSIS — Z95828 Presence of other vascular implants and grafts: Secondary | ICD-10-CM

## 2019-12-03 DIAGNOSIS — D3501 Benign neoplasm of right adrenal gland: Secondary | ICD-10-CM | POA: Insufficient documentation

## 2019-12-03 DIAGNOSIS — M2550 Pain in unspecified joint: Secondary | ICD-10-CM | POA: Diagnosis not present

## 2019-12-03 DIAGNOSIS — C8518 Unspecified B-cell lymphoma, lymph nodes of multiple sites: Secondary | ICD-10-CM | POA: Diagnosis not present

## 2019-12-03 DIAGNOSIS — Z87891 Personal history of nicotine dependence: Secondary | ICD-10-CM | POA: Insufficient documentation

## 2019-12-03 DIAGNOSIS — Z79899 Other long term (current) drug therapy: Secondary | ICD-10-CM | POA: Diagnosis not present

## 2019-12-03 DIAGNOSIS — H9319 Tinnitus, unspecified ear: Secondary | ICD-10-CM | POA: Diagnosis not present

## 2019-12-03 DIAGNOSIS — C8338 Diffuse large B-cell lymphoma, lymph nodes of multiple sites: Secondary | ICD-10-CM | POA: Insufficient documentation

## 2019-12-03 DIAGNOSIS — J700 Acute pulmonary manifestations due to radiation: Secondary | ICD-10-CM | POA: Insufficient documentation

## 2019-12-03 DIAGNOSIS — Y842 Radiological procedure and radiotherapy as the cause of abnormal reaction of the patient, or of later complication, without mention of misadventure at the time of the procedure: Secondary | ICD-10-CM | POA: Insufficient documentation

## 2019-12-03 DIAGNOSIS — Z7189 Other specified counseling: Secondary | ICD-10-CM

## 2019-12-03 DIAGNOSIS — Z8269 Family history of other diseases of the musculoskeletal system and connective tissue: Secondary | ICD-10-CM | POA: Insufficient documentation

## 2019-12-03 DIAGNOSIS — M25561 Pain in right knee: Secondary | ICD-10-CM

## 2019-12-03 DIAGNOSIS — D3502 Benign neoplasm of left adrenal gland: Secondary | ICD-10-CM | POA: Insufficient documentation

## 2019-12-03 DIAGNOSIS — M25562 Pain in left knee: Secondary | ICD-10-CM

## 2019-12-03 DIAGNOSIS — R197 Diarrhea, unspecified: Secondary | ICD-10-CM | POA: Insufficient documentation

## 2019-12-03 DIAGNOSIS — M170 Bilateral primary osteoarthritis of knee: Secondary | ICD-10-CM | POA: Diagnosis not present

## 2019-12-03 DIAGNOSIS — Z806 Family history of leukemia: Secondary | ICD-10-CM | POA: Insufficient documentation

## 2019-12-03 DIAGNOSIS — Z801 Family history of malignant neoplasm of trachea, bronchus and lung: Secondary | ICD-10-CM | POA: Insufficient documentation

## 2019-12-03 LAB — COMPREHENSIVE METABOLIC PANEL
ALT: 11 U/L (ref 0–44)
AST: 17 U/L (ref 15–41)
Albumin: 3.2 g/dL — ABNORMAL LOW (ref 3.5–5.0)
Alkaline Phosphatase: 69 U/L (ref 38–126)
Anion gap: 10 (ref 5–15)
BUN: 21 mg/dL (ref 8–23)
CO2: 24 mmol/L (ref 22–32)
Calcium: 8.6 mg/dL — ABNORMAL LOW (ref 8.9–10.3)
Chloride: 99 mmol/L (ref 98–111)
Creatinine, Ser: 0.87 mg/dL (ref 0.44–1.00)
GFR calc Af Amer: >60 mL/min (ref >60)
GFR calc non Af Amer: >60 mL/min (ref >60)
Glucose, Bld: 111 mg/dL — ABNORMAL HIGH (ref 70–99)
Potassium: 4.1 mmol/L (ref 3.5–5.1)
Sodium: 133 mmol/L — ABNORMAL LOW (ref 135–145)
Total Bilirubin: 0.9 mg/dL (ref 0.3–1.2)
Total Protein: 7 g/dL (ref 6.5–8.1)

## 2019-12-03 LAB — CBC WITH DIFFERENTIAL/PLATELET
Abs Immature Granulocytes: 0.14 10*3/uL — ABNORMAL HIGH (ref 0.00–0.07)
Basophils Absolute: 0.1 10*3/uL (ref 0.0–0.1)
Basophils Relative: 0 %
Eosinophils Absolute: 0.1 10*3/uL (ref 0.0–0.5)
Eosinophils Relative: 1 %
HCT: 32.4 % — ABNORMAL LOW (ref 36.0–46.0)
Hemoglobin: 10.6 g/dL — ABNORMAL LOW (ref 12.0–15.0)
Immature Granulocytes: 1 %
Lymphocytes Relative: 12 %
Lymphs Abs: 1.3 10*3/uL (ref 0.7–4.0)
MCH: 33 pg (ref 26.0–34.0)
MCHC: 32.7 g/dL (ref 30.0–36.0)
MCV: 100.9 fL — ABNORMAL HIGH (ref 80.0–100.0)
Monocytes Absolute: 1.5 10*3/uL — ABNORMAL HIGH (ref 0.1–1.0)
Monocytes Relative: 13 %
Neutro Abs: 8.4 10*3/uL — ABNORMAL HIGH (ref 1.7–7.7)
Neutrophils Relative %: 73 %
Platelets: 331 10*3/uL (ref 150–400)
RBC: 3.21 MIL/uL — ABNORMAL LOW (ref 3.87–5.11)
RDW: 19.4 % — ABNORMAL HIGH (ref 11.5–15.5)
WBC: 11.4 10*3/uL — ABNORMAL HIGH (ref 4.0–10.5)
nRBC: 0 % (ref 0.0–0.2)

## 2019-12-03 MED ORDER — HEPARIN SOD (PORK) LOCK FLUSH 100 UNIT/ML IV SOLN
500.0000 [IU] | Freq: Once | INTRAVENOUS | Status: AC
  Administered 2019-12-03: 500 [IU] via INTRAVENOUS
  Filled 2019-12-03: qty 5

## 2019-12-03 MED ORDER — ACETAMINOPHEN 325 MG PO TABS: 650.0000 mg | ORAL_TABLET | Freq: Once | ORAL | Status: DC

## 2019-12-03 NOTE — Telephone Encounter (Signed)
Spoke with Toni Griffith to inform her that she will need a COVID testing before the thoracentesis, [facility]-10[facility]30. Toni patty was understanding and agreeable.

## 2019-12-03 NOTE — Telephone Encounter (Signed)
Orders was faxed over to speciaty scheduling for a Thoracentesis. Order Confromation has been confirmed.

## 2019-12-03 NOTE — Progress Notes (Signed)
Per [name], no treatment today.

## 2019-12-03 NOTE — Progress Notes (Signed)
Pharmacist Chemotherapy Monitoring - Follow Up Assessment    I verify that I have reviewed each item in the below checklist:  . Regimen for the patient is scheduled for the appropriate day and plan matches scheduled date. Marland Kitchen Appropriate non-routine labs are ordered dependent on drug ordered. . If applicable, additional medications reviewed and ordered per protocol based on lifetime cumulative doses and/or treatment regimen.   Plan for follow-up and/or issues identified: No . I-vent associated with next due treatment: No . MD and/or nursing notified: No  Yarieliz Wasser K 12/03/2019 3:42 PM

## 2019-12-04 ENCOUNTER — Other Ambulatory Visit: Payer: Self-pay

## 2019-12-04 ENCOUNTER — Other Ambulatory Visit
Admission: RE | Admit: 2019-12-04 | Discharge: 2019-12-04 | Disposition: A | Discharge status: Home / Self Care | Payer: Medicare HMO | Source: Ambulatory Visit | Attending: Hematology and Oncology | Admitting: Hematology and Oncology

## 2019-12-04 DIAGNOSIS — Z20822 Contact with and (suspected) exposure to covid-19: Secondary | ICD-10-CM | POA: Diagnosis not present

## 2019-12-04 DIAGNOSIS — Z01812 Encounter for preprocedural laboratory examination: Secondary | ICD-10-CM | POA: Diagnosis present

## 2019-12-04 LAB — SARS CORONAVIRUS 2 (TAT 6-24 HRS): SARS Coronavirus 2: NEGATIVE

## 2019-12-05 ENCOUNTER — Ambulatory Visit
Admission: RE | Admit: 2019-12-05 | Discharge: 2019-12-05 | Disposition: A | Discharge status: Home / Self Care | Payer: Medicare HMO | Source: Ambulatory Visit | Attending: Hematology and Oncology | Admitting: Hematology and Oncology

## 2019-12-05 ENCOUNTER — Other Ambulatory Visit: Payer: Self-pay | Admitting: Physician Assistant

## 2019-12-05 ENCOUNTER — Ambulatory Visit
Admission: RE | Admit: 2019-12-05 | Discharge: 2019-12-05 | Disposition: A | Discharge status: Home / Self Care | Payer: Medicare HMO | Source: Ambulatory Visit | Attending: Physician Assistant | Admitting: Physician Assistant

## 2019-12-05 ENCOUNTER — Other Ambulatory Visit: Payer: Self-pay

## 2019-12-05 DIAGNOSIS — J910 Malignant pleural effusion: Secondary | ICD-10-CM | POA: Insufficient documentation

## 2019-12-05 DIAGNOSIS — J90 Pleural effusion, not elsewhere classified: Secondary | ICD-10-CM

## 2019-12-05 DIAGNOSIS — Z9889 Other specified postprocedural states: Secondary | ICD-10-CM

## 2019-12-05 DIAGNOSIS — Z85118 Personal history of other malignant neoplasm of bronchus and lung: Secondary | ICD-10-CM | POA: Insufficient documentation

## 2019-12-05 DIAGNOSIS — Z8572 Personal history of non-Hodgkin lymphomas: Secondary | ICD-10-CM | POA: Diagnosis not present

## 2019-12-05 LAB — GLUCOSE, PLEURAL OR PERITONEAL FLUID: Glucose, Fluid: 111 mg/dL

## 2019-12-05 LAB — BODY FLUID CELL COUNT WITH DIFFERENTIAL
Eos, Fluid: 0 %
Lymphs, Fluid: 21 %
Monocyte-Macrophage-Serous Fluid: 15 %
Neutrophil Count, Fluid: 64 %
Total Nucleated Cell Count, Fluid: 3851 cu mm

## 2019-12-05 LAB — PROTEIN, PLEURAL OR PERITONEAL FLUID: Total protein, fluid: 3.8 g/dL

## 2019-12-05 LAB — LACTATE DEHYDROGENASE, PLEURAL OR PERITONEAL FLUID: LD, Fluid: 330 U/L — ABNORMAL HIGH (ref 3–23)

## 2019-12-08 LAB — BODY FLUID CULTURE: Culture: NO GROWTH

## 2019-12-09 ENCOUNTER — Telehealth: Payer: Self-pay | Admitting: *Deleted

## 2019-12-09 LAB — CYTOLOGY - NON PAP

## 2019-12-09 LAB — PH, BODY FLUID: pH, Body Fluid: 7.8

## 2019-12-09 NOTE — Telephone Encounter (Signed)
I will call her now.

## 2019-12-09 NOTE — Telephone Encounter (Signed)
Spoke to patient's daughter Kaytlen via telephone. Today around 2:30, patient experienced 2 brief episodes of a cold type chill, followed by shaking on one side of her body, starting in the arm and going down into the leg, which lasted for less than a minute. Patient was feeling panicked during the episodes and reported feeling short of breath. Patient never lost consciousness, or the ability to communicate. Patient is back to her baseline now. Patient has appointment scheduled with [name] tomorrow am. Spoke with Rulon Abide, NP about patient's episodes and per her advice, instructed patient's daughter to take her to the ED for evaluation. Patient's daughter verbalized understanding and stated that she would take her to the ED in [place], which is closer to their home.

## 2019-12-09 NOTE — Telephone Encounter (Signed)
Toni Griffith called reporting that patient is shaking on her right side only, her hand, arm, and leg. She has no fever and she thinks that her BP may be up due to this. She is asking for a return call [facility] to discuss.

## 2019-12-10 ENCOUNTER — Other Ambulatory Visit: Payer: Self-pay | Admitting: Hematology and Oncology

## 2019-12-10 ENCOUNTER — Inpatient Hospital Stay: Payer: Medicare HMO | Admitting: Hematology and Oncology

## 2019-12-10 ENCOUNTER — Other Ambulatory Visit: Payer: Medicare HMO

## 2019-12-10 ENCOUNTER — Telehealth: Payer: Self-pay | Admitting: *Deleted

## 2019-12-10 ENCOUNTER — Telehealth: Payer: Self-pay

## 2019-12-10 ENCOUNTER — Ambulatory Visit: Payer: Medicare HMO

## 2019-12-10 ENCOUNTER — Inpatient Hospital Stay: Payer: Medicare HMO

## 2019-12-10 ENCOUNTER — Ambulatory Visit: Payer: Medicare HMO | Admitting: Hematology and Oncology

## 2019-12-10 DIAGNOSIS — R000 Tachycardia, unspecified: Secondary | ICD-10-CM | POA: Insufficient documentation

## 2019-12-10 DIAGNOSIS — R0682 Tachypnea, not elsewhere classified: Secondary | ICD-10-CM | POA: Insufficient documentation

## 2019-12-10 DIAGNOSIS — R6883 Chills (without fever): Secondary | ICD-10-CM | POA: Insufficient documentation

## 2019-12-10 DIAGNOSIS — E538 Deficiency of other specified B group vitamins: Secondary | ICD-10-CM

## 2019-12-10 LAB — PATHOLOGY

## 2019-12-10 LAB — COMP PANEL: LEUKEMIA/LYMPHOMA

## 2019-12-10 MED ORDER — ALLOPURINOL 300 MG PO TABS
300.00 | ORAL_TABLET | ORAL | Status: DC
Start: 2019-12-12 — End: 2019-12-10

## 2019-12-10 MED ORDER — ATORVASTATIN CALCIUM 80 MG PO TABS
80.00 | ORAL_TABLET | ORAL | Status: DC
Start: 2019-12-11 — End: 2019-12-10

## 2019-12-10 MED ORDER — DULOXETINE HCL 20 MG PO CPEP
20.00 | ORAL_CAPSULE | ORAL | Status: DC
Start: 2019-12-11 — End: 2019-12-10

## 2019-12-10 MED ORDER — CYANOCOBALAMIN 1000 MCG PO TABS
500.00 | ORAL_TABLET | ORAL | Status: DC
Start: 2019-12-12 — End: 2019-12-10

## 2019-12-10 MED ORDER — ONDANSETRON 4 MG PO TBDP
4.00 | ORAL_TABLET | ORAL | Status: DC
Start: ? — End: 2019-12-10

## 2019-12-10 MED ORDER — CEFEPIME HCL 2 G IJ SOLR
2.00 | INTRAMUSCULAR | Status: DC
Start: 2019-12-10 — End: 2019-12-10

## 2019-12-10 MED ORDER — ACETAMINOPHEN 325 MG PO TABS
650.00 | ORAL_TABLET | ORAL | Status: DC
Start: ? — End: 2019-12-10

## 2019-12-10 MED ORDER — ENOXAPARIN SODIUM 40 MG/0.4ML ~~LOC~~ SOLN
40.00 | SUBCUTANEOUS | Status: DC
Start: 2019-12-12 — End: 2019-12-10

## 2019-12-10 MED ORDER — VANCOMYCIN HCL IN DEXTROSE 1-5 GM/200ML-% IV SOLN
1000.00 | INTRAVENOUS | Status: DC
Start: 2019-12-10 — End: 2019-12-10

## 2019-12-10 MED ORDER — ASPIRIN 81 MG PO CHEW
81.00 | CHEWABLE_TABLET | ORAL | Status: DC
Start: 2019-12-12 — End: 2019-12-10

## 2019-12-10 NOTE — Telephone Encounter (Signed)
-----   Message from Lequita Asal, MD sent at 12/10/2019 10:07 AM EDT ----- Regarding: RE: call Contact: [phone]  Can you call the patient's daughter?    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  We will discuss direction of therapy with her mother at our follow-up appointment after discharge from the hospital.  Lequita Asal, MD   ----- Message ----- From: Secundino Ginger Sent: 12/10/2019   9:04 AM EDT To: Lequita Asal, MD, Drue Dun, RN Subject: call                                           [name] Patient's daughter called and said she would like someone call her with results from Friday 5/7 procedure.

## 2019-12-10 NOTE — Telephone Encounter (Signed)
  Did they say that she was going to be discharged?  Are they going to do the repeat thoracentesis there?  M

## 2019-12-10 NOTE — Telephone Encounter (Signed)
Md in training at [facility] called to report that patient is in the hospital and that she may need another thoracentesis as an outpatient. She will not be at her appointment today

## 2019-12-10 NOTE — Progress Notes (Signed)
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

## 2019-12-10 NOTE — Telephone Encounter (Signed)
Spoke with [name] and advised her of the results per Dr Mike Gip. [name] states she will make an appointment with Dr Mike Gip as soon as they know when she will be discharged.

## 2019-12-10 NOTE — Telephone Encounter (Signed)
No, they have her admitted and said that you should follow up thoracentesis after she is discharged and I gave them your cell number to call you

## 2019-12-11 ENCOUNTER — Ambulatory Visit: Payer: Medicare HMO

## 2019-12-11 ENCOUNTER — Other Ambulatory Visit: Payer: Medicare HMO

## 2019-12-11 ENCOUNTER — Ambulatory Visit: Payer: Medicare HMO | Admitting: Hematology and Oncology

## 2019-12-12 MED ORDER — GENERIC EXTERNAL MEDICATION
Status: DC
Start: ? — End: 2019-12-12

## 2019-12-12 MED ORDER — DICLOFENAC SODIUM 1 % EX GEL
2.00 | CUTANEOUS | Status: DC
Start: 2019-12-11 — End: 2019-12-12

## 2019-12-12 MED ORDER — AMOXICILLIN-POT CLAVULANATE 875-125 MG PO TABS
875.00 | ORAL_TABLET | ORAL | Status: DC
Start: ? — End: 2019-12-12

## 2019-12-15 ENCOUNTER — Other Ambulatory Visit: Payer: Self-pay | Admitting: *Deleted

## 2019-12-15 DIAGNOSIS — C3490 Malignant neoplasm of unspecified part of unspecified bronchus or lung: Secondary | ICD-10-CM

## 2019-12-15 DIAGNOSIS — C8518 Unspecified B-cell lymphoma, lymph nodes of multiple sites: Secondary | ICD-10-CM

## 2019-12-15 NOTE — Progress Notes (Signed)
[facility]  [address] [place], [place] [address] Phone: [phone]  Fax: [phone]   Clinic Day:  12/17/2019  Referring physician: Francesca Oman, DO  Chief Complaint: Toni Griffith is a 79 y.o. 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   HPI: The patient was last seen in the medical oncology clinic on 12/03/2019. At that time, she felt "fine". Her son noted she had exertional shortness of breath and cough. She no longer had diarrhea. Her knee pain was unchanged s/p corticosteroid injection.  Chest, abdomen, and pelvis CT on 12/02/2019 revealed a moderate size left pleural effusion.  Treatment was held.  We discussed diagnostic and therapeutic thoracentesis.   Ultrasound guided left thoracentesis on 12/05/2019 revealed 600 mL of clear gold colored pleural fluid.  Cytology was positive for non-small cell carcinoma.   She was admitted to [facility] from 12/09/2019-12/11/2019 with chills, diaphoresis, tachycardia, and tachypnea for r/o sepsis. She was treated with broad spectrum antibiotics (vancomycin and Cefepime). Cultures remained negative.  Pulmonary medicine recommended therapeutic PleurX catheter placement.   Chest CT on 12/10/2019 revealed moderate loculated left pleural effusion.  There was abrupt cutoff of lingular bronchus and associated distal postobstructive collapse, with superimposed consolidation/mass not completely excluded.  There was a new 0.8 cm solid right lower lobe pulmonary nodule, indeterminate but concerning for metastasis.  Symptomatically, she is doing good today.  On 12/23/2019, she will see Dr. Algis Liming at [place] to assess the fluid accumulating on her left side. She still has numbness in her fingers and the sole of her left foot. She admits to soreness on the left side of her ribs. She needs more assistance at  home. Her daughter says she is low on energy. She is unable to warm up food in the microwave.  She needs help getting in and out of the bath and needs assistance getting dressed. She has shortness of breath. She has a cough but does not cough up anything.  She sometimes gags. She has pain in her back right below her bra line. She takes Tylenol to relieve the pain. She feels unsure of what to do moving forward regarding the left pleural effusion. She has a decreased appetite; she is unable to eat more than a few bites of her meals.    Past Medical History:  Diagnosis Date  . Arthritis   . Cancer [facility])    lymphoma-right hip  . Deaf   . GERD (gastroesophageal reflux disease)   . Hypertension    NOT ON MED AT PRESENT  . Squamous cell lung cancer [facility])     Past Surgical History:  Procedure Laterality Date  . CARDIAC SURGERY  1993  . CORONARY ARTERY BYPASS GRAFT     DOUBLE  . CYST REMOVAL HAND Right   . ENDOBRONCHIAL ULTRASOUND N/A 05/30/2018   Procedure: ENDOBRONCHIAL ULTRASOUND;  Surgeon: Tyler Pita, MD;  Location: [facility] ORS;  Service: Cardiopulmonary;  Laterality: N/A;  . PERIPHERAL VASCULAR CATHETERIZATION N/A 04/06/2016   Procedure: Glori Luis Cath Insertion;  Surgeon: Algernon Huxley, MD;  Location: [place] [facility];  Service: Cardiovascular;  Laterality: N/A;    Family History  Problem Relation Age of Onset  . Lung cancer Mother   . Lung cancer Father   . Diabetes Maternal Uncle   . Myasthenia gravis Maternal Grandmother   . Leukemia Maternal Grandfather  Social History:  reports that she quit smoking about 4 years ago. Her smoking use included cigarettes. She has a 80.00 pack-year smoking history. She has never used smokeless tobacco. She reports current alcohol use. She reports that she does not use drugs.  She smoked 1 1/2 packs per day for 20 years (30 pack year smoking history) until 10/2015. She is hearing impaired (deaf). She lives in [place] with her boyfriend. Her  daughter lives in [place]. Her daughter, [name]'s contact number is [phone].She has 2 cats named Pepper and Baby. The patient is accompanied by Lucretia Roers, interpreter, and her daughter and her son on the iPad today.  Allergies: No Known Allergies  Current Medications: Current Outpatient Medications  Medication Sig Dispense Refill  . acetaminophen (TYLENOL) 500 MG tablet Take 500 mg by mouth every 6 (six) hours as needed for moderate pain.     Marland Kitchen allopurinol (ZYLOPRIM) 300 MG tablet TAKE 1 TABLET BY MOUTH EVERY DAY 90 tablet 0  . amoxicillin-clavulanate (AUGMENTIN) 875-125 MG tablet Take 1 tablet by mouth 2 (two) times daily.    Marland Kitchen aspirin EC 81 MG tablet Take 81 mg by mouth daily.    Marland Kitchen atorvastatin (LIPITOR) 80 MG tablet Take 80 mg by mouth every evening.     Marland Kitchen b complex vitamins tablet Take 1 tablet by mouth daily.    Carin Hock Iron (IRON CHEWS PEDIATRIC PO) Take 5 mg by mouth daily.    . Cholecalciferol (VITAMIN D3) 2000 units capsule Take 1,000 Units by mouth daily.     Marland Kitchen dextromethorphan 15 MG/5ML syrup Take 10 mLs by mouth 4 (four) times daily as needed for cough.    . DULoxetine (CYMBALTA) 60 MG capsule     . lidocaine-prilocaine (EMLA) cream Apply cream 1 hour before chemotherapy treatment and place small peive of saran wrap over cream to protect clothing 30 g 3  . magnesium oxide (MAG-OX) 400 (241.3 Mg) MG tablet Take 1 tablet (400 mg total) by mouth every other day. 30 tablet 0  . Omega-3 Fatty Acids (FISH OIL PO) Take 1 tablet by mouth daily.    . ondansetron (ZOFRAN ODT) 4 MG disintegrating tablet Take 1 tablet (4 mg total) by mouth every 8 (eight) hours as needed for nausea or vomiting. 30 tablet 1  . pantoprazole (PROTONIX) 40 MG tablet Take 40 mg by mouth daily.     . potassium chloride SA (KLOR-CON) 20 MEQ tablet Take 1 tablet (20 mEq total) by mouth daily. Take as directed by MD. 30 tablet 0  . vitamin E 400 UNIT capsule Take 400 Units by mouth daily.      No current  facility-administered medications for this visit.   Facility-Administered Medications Ordered in Other Visits  Medication Dose Route Frequency Provider Last Rate Last Admin  . heparin lock flush 100 unit/mL  500 Units Intravenous Once Myrtie Leuthold C, MD      . heparin lock flush 100 unit/mL  500 Units Intravenous Once Kuron Docken C, MD      . sodium chloride flush (NS) 0.9 % injection 10 mL  10 mL Intravenous PRN Nolon Stalls C, MD   10 mL at 04/05/17 1034  . sodium chloride flush (NS) 0.9 % injection 10 mL  10 mL Intravenous PRN Lequita Asal, MD   10 mL at 02/27/19 1033  . sodium chloride flush (NS) 0.9 % injection 10 mL  10 mL Intravenous PRN Lequita Asal, MD  Review of Systems  Constitutional: Positive for weight loss (5 lbs). Negative for chills, diaphoresis, fever and malaise/fatigue.       No energy.  Needs more assistance.  HENT: Positive for hearing loss (deaf). Negative for congestion and sore throat.   Eyes: Negative for blurred vision and double vision.  Respiratory: Positive for cough and shortness of breath (exertional). Negative for hemoptysis and sputum production.   Cardiovascular: Negative for chest pain, palpitations and leg swelling.  Gastrointestinal: Negative for abdominal pain, blood in stool, constipation, diarrhea (resolved), heartburn, melena, nausea and vomiting.       Normal bowels.   Genitourinary: Negative for dysuria, frequency and urgency.  Musculoskeletal: Positive for back pain (lower back) and joint pain (chronic; knees (L>R); osteoarthritis; s/p corticosteroid injection). Negative for falls and myalgias.  Skin: Negative for itching and rash.  Neurological: Positive for sensory change (baseline neuropathy unchanged; left hand 2nd and 3rddigits. neuropathy in sole of feet unchanged). Negative for dizziness, weakness and headaches.  Endo/Heme/Allergies: Negative for environmental allergies and polydipsia. Does not  bruise/bleed easily.       Hot flashes at times  Psychiatric/Behavioral: Negative for depression. The patient is nervous/anxious.   All other systems reviewed and are negative.  Performance status (ECOG):  2-3  Vitals Blood pressure 128/73, pulse (!) 110, temperature (!) 97.1 F (36.2 C), temperature source Tympanic, weight 166 lb 3.2 oz (75.4 kg), SpO2 97 %.   Physical Exam  Constitutional: She is oriented to person, place, and time. She appears well-developed and well-nourished. No distress. Face mask in place.  Patient examined in wheelchair secondary to decreased mobility of her knees.  HENT:  Head: Normocephalic and atraumatic.  Mouth/Throat: Oropharynx is clear and moist and mucous membranes are normal. No oral lesions.  Yellow hair wrap.   Eyes: Pupils are equal, round, and reactive to light. Conjunctivae and EOM are normal. Right eye exhibits no discharge. Left eye exhibits no discharge. No scleral icterus.  Hazel eyes.  Neck: No JVD present.  Cardiovascular: Normal rate, regular rhythm and normal heart sounds. Exam reveals no gallop and no friction rub.  No murmur heard. Pulmonary/Chest: Effort normal and breath sounds normal. She has no wheezes. She has no rhonchi. She has no rales.  Tenderness overlying left lateral ribs.  Decreased breath sounds 1/3rd the way up (stable).  Abdominal: Soft. Normal appearance and bowel sounds are normal. She exhibits no distension and no mass. There is no hepatosplenomegaly. There is no abdominal tenderness. There is no rebound and no guarding.  Musculoskeletal:        General: No tenderness or edema. Normal range of motion.     Cervical back: Normal range of motion and neck supple.     Left knee: No swelling.  Lymphadenopathy:       Head (right side): No preauricular, no posterior auricular and no occipital adenopathy present.       Head (left side): No preauricular, no posterior auricular and no occipital adenopathy present.    She has no  cervical adenopathy.       Right cervical: No superficial cervical adenopathy present.      Left cervical: No superficial cervical adenopathy present.    She has no axillary adenopathy.       Right: No inguinal and no supraclavicular adenopathy present.       Left: No inguinal and no supraclavicular adenopathy present.  Neurological: She is alert and oriented to person, place, and time.  Skin: Skin is warm, dry  and intact. No bruising, no lesion and no rash noted. She is not diaphoretic. No erythema. No pallor.  Psychiatric: She has a normal mood and affect. Her behavior is normal. Judgment and thought content normal.  Nursing note and vitals reviewed.    Infusion on 12/17/2019  Component Date Value Ref Range Status  . WBC 12/17/2019 10.3  4.0 - 10.5 K/uL Final  . RBC 12/17/2019 3.25* 3.87 - 5.11 MIL/uL Final  . Hemoglobin 12/17/2019 10.5* 12.0 - 15.0 g/dL Final  . HCT 12/17/2019 32.4* 36.0 - 46.0 % Final  . MCV 12/17/2019 99.7  80.0 - 100.0 fL Final  . MCH 12/17/2019 32.3  26.0 - 34.0 pg Final  . MCHC 12/17/2019 32.4  30.0 - 36.0 g/dL Final  . RDW 12/17/2019 18.1* 11.5 - 15.5 % Final  . Platelets 12/17/2019 413* 150 - 400 K/uL Final  . nRBC 12/17/2019 0.0  0.0 - 0.2 % Final  . Neutrophils Relative % 12/17/2019 74  % Final  . Neutro Abs 12/17/2019 7.7  1.7 - 7.7 K/uL Final  . Lymphocytes Relative 12/17/2019 10  % Final  . Lymphs Abs 12/17/2019 1.1  0.7 - 4.0 K/uL Final  . Monocytes Relative 12/17/2019 12  % Final  . Monocytes Absolute 12/17/2019 1.2* 0.1 - 1.0 K/uL Final  . Eosinophils Relative 12/17/2019 2  % Final  . Eosinophils Absolute 12/17/2019 0.2  0.0 - 0.5 K/uL Final  . Basophils Relative 12/17/2019 0  % Final  . Basophils Absolute 12/17/2019 0.0  0.0 - 0.1 K/uL Final  . Immature Granulocytes 12/17/2019 2  % Final  . Abs Immature Granulocytes 12/17/2019 0.16* 0.00 - 0.07 K/uL Final   Performed at [facility], [address] [place], [place] [address]     Assessment:  Toni Griffith is a 79 y.o. female withstage IVB B-cell lymphoma, unclassifiable, with features intermediate between diffuse large B-cell lymphoma and classical Hodgkin's lymphoma ("gray zone" lymphoma). She underwent right iliac wing bone/bone marrow biopsy on 03/08/2016.  PET scanon 03/03/2016 revealed a hypermetabolic left lower lobe mass, bilateral pleural and pulmonary parenchymal nodular metastasis, a large pleural lesion invading the anterior left chest wall. There was activity in the superior left ocular orbit. There was a large lesion centered in the right iliac wing with soft tissue extension. There were hypermetabolic right external iliac lymph nodes  Echoon 04/04/2016 revealed an EF of 55-60%. Hepatitis B and C testing was negative on 03/30/2016. G6PD assay was normal. She declined LP with MTX prophylaxis.  She received 6 cycles of mini-RCHOP(04/07/2016 - 07/21/2016). She has had some transient fingertip numbness.  She received radiationto the right iliac wing, from 10/11/2016 - 11/01/2016. She received 3000 cGy over 3 weeks.  PET scanon 01/29/2017 revealed interval progression of hypermetabolic nodules in the right upper (13 mm compared to 9 mm; SUV 10) and left lower lobes (26 mm compared to 22 mm; SUV 7.5). There was new hypermetabolic focus of activity along the anterior left pleura although no underlying pleural or lung mass was evident.  She has squamous cell lung cancer. Foundation Oneon the lung biopsy on 03/15/2018 revealed MS-stable, tumor mutational burden 5 Muts/Mb, AKT2 amplification, CCNE1 amplification, CREBBP R1446L, KDR amplification, KIT amplification, PDGFRA amplification, PTEN loss exons 2-4, and XN17 splice site 001+7C>B. Marland Kitchen There were no alterations in EGFR, KRAS, RET, ALK, MET, ERBB2, BRAF, ROS1. PDL-1IHC analysis revealed a TPS score of 50%.  CT guided left lower lobe nodule biopsyon 02/19/2017 confirmed  squamous cell carcinoma.  She underwent SBRT08/27/2018 - 04/09/2017. She received 5000 cGy in 5 fractions to the left lower lobe lesion. She underwent SBRTto a RUL lesion from 05/23/2017 - 06/18/2017.  CT guided left upper lobe nodule biopsyon 02/20/2018 revealedsquamous cell lung cancer. She received SBRTfrom 04/09/2018 - 04/15/2018. She received 6000 cGy in 5 fractions.   Bronchoscopyon 05/30/2018 revealed no endobronchial lesions. The 2 cm pre-carinal lymph node was biopsied. Pathology revealed metastatic non-small cell carcinoma, favor squamous cell carcinoma.  CT guided right paraspinal lesion biopsyon 10/16/2017 was compatible with involvement by the patient's previously diagnosed lymphoma. The case was referred to [facility] hematopathology. The neoplastic cells were largely CD30 negative. Repeat CD30 immunohistochemistry faintly stained >10% of the large atypical cells. CD20 stained the scattered large atypical cells and few small cells.   She received 3 cycles of brentuximab vedotin(11/23/2017 - 01/11/2018).Her lymphoma progressed.  She is s/p14cycles of pembrolizumab(06/14/2018 -05/21/2019). She received a steroid Dosepak on 01/09/2019 for minor chest CT changes felt secondary to pembrolizumab.  She iss/p 4 cycles ofpembolizumab, carboplatin and Abraxane (08/29/2019 - 11/05/2019). She experienced tinnitus after her first day of chemotherapy. Tinnitus was minor with cycle #2.  Chest, abdomen, and pelvis CTon 06/06/2019 revealed an interval mass-like opacity in the superior aspect of the left parahilar lungwith an interval more prominent mass-like component involving the pleura laterally at the level with slightly more prominent extension into the left hila region, suspicious for recurrent or progressive lung cancer. There was mediastinal and bilateral hilar adenopathyc/wmetastatic adenopathy or involvement with the patient's B-cell lymphoma. There was no  evidence of metastatic disease or lymphoma involving the abdomen or pelvis. There was significantly improved ground-glass opacities in the right lung, minimal left pleural fluid(improved), andastable large hiatal hernia.There was stable enlarged thyroid gland containing heterogeneous nodules with substernal extension on the left.There was stable to slightly decreased size of the previously demonstrated right adrenal nodule and no significant change in a small, poorly visualized left adrenal nodule.  PET scanon 06/17/2019 revealed a known left parahilar lung mass markedly hypermetabolic with associated hypermetabolic lymphadenopathy in both hilar regions, the mediastinum, andthe right thoracic inlet. There was hypermetabolism in the right submandibular gland, indeterminate. There was no hypermetabolic metastatic disease evident in the abdomen or pelvis. There was a stable bilateral adrenal adenomas since 01/29/2018. There were tiny left pleural effusion and a large hiatal hernia.There was a segment of transverse colon extendingup into the hiatal hernia without obstructive features.   Shereceived4000cGyto the left hilar regionfrom12/14/2020 -08/15/2019.  Chest, abdomen and pelvis CT on 12/02/2019 revealed interval resolution of mediastinal and hilar adenopathy c/w response to therapy. 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 There was increased moderate-sized dependent left pleural effusion with increased left lower lobe compressive atelectasis. There was a new 6 mm nodule in the superior segment of the right lower lobe, nonspecific, but potentially a metastasis. Attention on follow-up was recommended.  There was no evidence of metastatic disease in the abdomen or pelvis.  There was a moderate-sized hiatal hernia and bilateral adrenal adenomas.   Ultrasound guided left  thoracentesis on 12/05/2019 revealed non-small cell carcinoma.   She hasiron deficiency anemia. Ferritinhas been followed: 14 on 05/06/2018,23 on 07/26/2018, and 59 on 11/01/2018. She denies any bleeding.  She has a grade I neuropathy. B12 and folate were normal on 12/06/2017. She has severe bilateral knee osteoarthritis. She has received steroid injections and Monovisc (hyaluronate derivatives) without improvement.   She is deafand requires an interpreter.  Symptomatically, she feels weak.  She is requiring more assistance.  Exam reveals a recurrent left sided effusion.  Plan: 1.   Labs today:CBC with diff, CMP, Mg, B12, folate. 2.Gray zone lymphoma Clinically,she denies any B symptoms Examreveals no adenopathy or hepatosplenomegaly. She has received 6 cycles of mini-RCOP, 3 cycles ofbrentuximab vedotin, and 16cycles of pembrolizumab. PET scan on 06/17/2019 revealed progressive disease in the chest felt secondary toprogressive lung cancer. She is s/p5additional cycle of maintenance pembrolizumab (last 11/05/2019). Discuss plan to reinstitute pembrolizumab pembrolizumab for ongoing control of gray zone lymphoma. 3. Clinical stage IV squamous cell lung cancer Clinically, he has a recurrent left-sided effusion. Patient is s/p SBRT to left upper lobe and left lower lobe nodules. Patient is s/p 15cyclesof pembrolizumab. Patient is s/p palliative radiation to the right hilar region (completed 08/15/2019). She is s/p4cycles of carboplatin and Abraxane (08/29/2019 - 11/05/2019). Chest, abdomen and pelvis CT scan on 12/02/2019 revealed a new moderate left-sided effusion.  Thoracentesis on 12/05/2019 confirmed non-small cell lung cancer.  Discuss plan for discontinuation of carboplatin and Abraxane.  Discuss plan for single agent chemotherapy (gemcitabine or Navelbine) rather than Taxotere given her performance status and recent  Abraxane.   Treatment is palliative.     Information provided on chemotherapy options.  Follow-up with [facility] regarding potential clinical trial. 4.Recurrent left sided pleural effusion  Patient is s/p thoracentesis on 12/05/2019 which confirmed non-small cell lung cancer.  Patient has an appointment with pulmonary medicine at [facility] on 12/23/2019 for consideration of pleurodesis vs Pleur-X catheter. 5.   Chemotherapy-induced neuropathy Neuropathy appears stable. Taxotere may increase her neuropathy. 6.B12 deficiency She is on oral B12. B12 and folate were normal on 12/17/2018. Monitor annually. 7. Cancer related pain  Patient requesting pain medications.  Rx: Norco 5-325 1/2 to 1 tablet p.o. every 6 hours prn pain (dispense: #30).   8.   Hypomagnesemia, resolved Magnesium 1.9. Etiology was felt secondary to carboplatin. 9.  No treatment today. 70.   MD to contact Dr Glean Salen re: clinical trials. 11.   RTC on 12/25/2019 for MD assessment, labs (CBC with diff, CMP, Mg), and possible treatment (pembrolizumab +/- chemotherapy).  I discussed the assessment and treatment plan with the patient.  The patient was provided an opportunity to ask questions and all were answered.  The patient agreed with the plan and demonstrated an understanding of the instructions.  The patient was advised to call back if the symptoms worsen or if the condition fails to improve as anticipated.  I provided 36 minutes (9:29 AM - 10:05 AM) of face-to-face time during this this encounter and > 50% was spent counseling as documented under my assessment and plan.  An additional 10 minutes were spent reviewing her chart (Epic and Care Everywhere) including notes, labs, and imaging studies.     Lequita Asal, MD, PhD    12/17/2019, 9:29 AM  I, Heywood Footman, am acting as Education administrator for [employer]. Mike Gip, MD, PhD.  I, Yuepheng Schaller C. Mike Gip, MD, have reviewed the above documentation for  accuracy and completeness, and I agree with the above.

## 2019-12-16 ENCOUNTER — Encounter: Payer: Self-pay | Admitting: Hematology and Oncology

## 2019-12-16 ENCOUNTER — Other Ambulatory Visit: Payer: Self-pay

## 2019-12-16 NOTE — Progress Notes (Signed)
Pre charting completed daughter supplied information for medical record.

## 2019-12-17 ENCOUNTER — Inpatient Hospital Stay (HOSPITAL_BASED_OUTPATIENT_CLINIC_OR_DEPARTMENT_OTHER): Payer: Medicare HMO | Admitting: Hematology and Oncology

## 2019-12-17 ENCOUNTER — Encounter: Payer: Self-pay | Admitting: Hematology and Oncology

## 2019-12-17 ENCOUNTER — Inpatient Hospital Stay: Payer: Medicare HMO

## 2019-12-17 ENCOUNTER — Inpatient Hospital Stay: Payer: Medicare HMO | Attending: Hematology and Oncology

## 2019-12-17 VITALS — BP 128/73 | HR 110 | Temp 97.1°F | Wt 166.2 lb

## 2019-12-17 DIAGNOSIS — G893 Neoplasm related pain (acute) (chronic): Secondary | ICD-10-CM

## 2019-12-17 DIAGNOSIS — C8518 Unspecified B-cell lymphoma, lymph nodes of multiple sites: Secondary | ICD-10-CM

## 2019-12-17 DIAGNOSIS — J90 Pleural effusion, not elsewhere classified: Secondary | ICD-10-CM | POA: Diagnosis not present

## 2019-12-17 DIAGNOSIS — E538 Deficiency of other specified B group vitamins: Secondary | ICD-10-CM

## 2019-12-17 DIAGNOSIS — C3490 Malignant neoplasm of unspecified part of unspecified bronchus or lung: Secondary | ICD-10-CM | POA: Diagnosis not present

## 2019-12-17 DIAGNOSIS — T451X5A Adverse effect of antineoplastic and immunosuppressive drugs, initial encounter: Secondary | ICD-10-CM

## 2019-12-17 DIAGNOSIS — G620 Drug-induced polyneuropathy: Secondary | ICD-10-CM

## 2019-12-17 DIAGNOSIS — C8338 Diffuse large B-cell lymphoma, lymph nodes of multiple sites: Secondary | ICD-10-CM | POA: Diagnosis not present

## 2019-12-17 LAB — CBC WITH DIFFERENTIAL/PLATELET
Abs Immature Granulocytes: 0.16 10*3/uL — ABNORMAL HIGH (ref 0.00–0.07)
Basophils Absolute: 0 10*3/uL (ref 0.0–0.1)
Basophils Relative: 0 %
Eosinophils Absolute: 0.2 10*3/uL (ref 0.0–0.5)
Eosinophils Relative: 2 %
HCT: 32.4 % — ABNORMAL LOW (ref 36.0–46.0)
Hemoglobin: 10.5 g/dL — ABNORMAL LOW (ref 12.0–15.0)
Immature Granulocytes: 2 %
Lymphocytes Relative: 10 %
Lymphs Abs: 1.1 10*3/uL (ref 0.7–4.0)
MCH: 32.3 pg (ref 26.0–34.0)
MCHC: 32.4 g/dL (ref 30.0–36.0)
MCV: 99.7 fL (ref 80.0–100.0)
Monocytes Absolute: 1.2 10*3/uL — ABNORMAL HIGH (ref 0.1–1.0)
Monocytes Relative: 12 %
Neutro Abs: 7.7 10*3/uL (ref 1.7–7.7)
Neutrophils Relative %: 74 %
Platelets: 413 10*3/uL — ABNORMAL HIGH (ref 150–400)
RBC: 3.25 MIL/uL — ABNORMAL LOW (ref 3.87–5.11)
RDW: 18.1 % — ABNORMAL HIGH (ref 11.5–15.5)
WBC: 10.3 10*3/uL (ref 4.0–10.5)
nRBC: 0 % (ref 0.0–0.2)

## 2019-12-17 LAB — COMPREHENSIVE METABOLIC PANEL
ALT: 16 U/L (ref 0–44)
AST: 18 U/L (ref 15–41)
Albumin: 2.7 g/dL — ABNORMAL LOW (ref 3.5–5.0)
Alkaline Phosphatase: 73 U/L (ref 38–126)
Anion gap: 8 (ref 5–15)
BUN: 18 mg/dL (ref 8–23)
CO2: 26 mmol/L (ref 22–32)
Calcium: 9 mg/dL (ref 8.9–10.3)
Chloride: 101 mmol/L (ref 98–111)
Creatinine, Ser: 0.69 mg/dL (ref 0.44–1.00)
GFR calc Af Amer: >60 mL/min (ref >60)
GFR calc non Af Amer: >60 mL/min (ref >60)
Glucose, Bld: 106 mg/dL — ABNORMAL HIGH (ref 70–99)
Potassium: 3.9 mmol/L (ref 3.5–5.1)
Sodium: 135 mmol/L (ref 135–145)
Total Bilirubin: 0.6 mg/dL (ref 0.3–1.2)
Total Protein: 6.7 g/dL (ref 6.5–8.1)

## 2019-12-17 LAB — MAGNESIUM: Magnesium: 1.9 mg/dL (ref 1.7–2.4)

## 2019-12-17 LAB — FOLATE: Folate: 9.3 ng/mL (ref >5.9)

## 2019-12-17 LAB — VITAMIN B12: Vitamin B-12: 1032 pg/mL — ABNORMAL HIGH (ref 180–914)

## 2019-12-17 MED ORDER — HEPARIN SOD (PORK) LOCK FLUSH 100 UNIT/ML IV SOLN
500.0000 [IU] | Freq: Once | INTRAVENOUS | Status: AC
  Administered 2019-12-17: 500 [IU] via INTRAVENOUS
  Filled 2019-12-17: qty 5

## 2019-12-17 MED ORDER — SODIUM CHLORIDE 0.9% FLUSH
10.0000 mL | INTRAVENOUS | Status: DC | PRN
  Administered 2019-12-17: 10 mL via INTRAVENOUS
  Filled 2019-12-17: qty 10

## 2019-12-17 MED ORDER — HYDROCODONE-ACETAMINOPHEN 5-325 MG PO TABS: ORAL_TABLET | ORAL | 0 refills | Status: DC

## 2019-12-17 NOTE — Patient Instructions (Signed)
Vinorelbine injection What is this medicine? VINORELBINE (vi NOR el been) is a chemotherapy drug. It targets fast dividing cells, like cancer cells, and causes these cells to die. This medicine is used to treat lung cancer. This medicine may be used for other purposes; ask your health care provider or pharmacist if you have questions. COMMON BRAND NAME(S): Navelbine What should I tell my health care provider before I take this medicine? They need to know if you have any of these conditions:  blockage in your bowel  liver disease  low blood counts, like low white cell, platelet, or red cell counts  lung or breathing disease, like asthma  tingling of the fingers or toes, or other nerve disorder  an unusual or allergic reaction to vinorelbine, other chemotherapy agents, other medicines, foods, dyes, or preservatives  pregnant or trying to get pregnant  breast-feeding How should I use this medicine? This medicine is for infusion into a vein. It is given by a healthcare professional in a hospital or clinic setting. Talk to your pediatrician regarding the use of this medicine in children. Special care may be needed. Overdosage: If you think you have taken too much of this medicine contact a poison control center or emergency room at once. NOTE: This medicine is only for you. Do not share this medicine with others. What if I miss a dose? It is important not to miss your dose. Call your doctor or health care professional if you are unable to keep an appointment. What may interact with this medicine?  certain antibiotics like erythromycin or clarithromycin  certain antivirals for HIV or hepatitis  certain medicines for fungal infections like fluconazole, ketoconazole, itraconazole, posaconazole, or voriconazole  cimetidine  ciprofloxacin  conivaptan  crizotinib  cyclosporine  diltiazem  dronedarone  fluvoxamine  grapefruit  juice  idelalisib  imatinib  nefazodone  nelfinavir  verapamil This list may not describe all possible interactions. Give your health care provider a list of all the medicines, herbs, non-prescription drugs, or dietary supplements you use. Also tell them if you smoke, drink alcohol, or use illegal drugs. Some items may interact with your medicine. What should I watch for while using this medicine? Your condition will be monitored carefully while you are receiving this medicine. This medicine may make you feel generally unwell. This is not uncommon as chemotherapy can affect healthy cells as well as cancer cells. Report any side effects. Continue your course of treatment even though you feel ill unless your healthcare professional tells you to stop. Do not become pregnant while taking this medicine or for 6 months after stopping it. Women should inform their healthcare professional if they wish to become pregnant or think they might be pregnant. Men should not father a child while taking this medicine and for 3 months after stopping it. There is potential for serious side effects to an unborn child. Talk to your healthcare professional for more information. Do not breast-feed while taking this medicine or for 9 days after stopping it. This may make it more difficult to father a child. Talk to your healthcare professional if you are concerned about your fertility. This medicine will cause constipation. Try to have a bowel movement at least every 2 to 3 days. If you do not have a bowel movement for 3 days, call your healthcare professional. This medicine may increase your risk of getting an infection. Call your healthcare professional for advice if you get a fever, chills, or sore throat, or other symptoms of  a cold or flu. Do not treat yourself. Try to avoid being around people who are sick. Call your healthcare professional if you are around anyone with measles, chickenpox, or if you develop sores  or blisters that do not heal properly. You may need blood work done while you are taking this medicine. Avoid taking medicines that contain aspirin, acetaminophen, ibuprofen, naproxen, or ketoprofen unless instructed by your healthcare professional. These medicines may hide a fever. Be careful brushing or flossing your teeth or using a toothpick because you may get an infection or bleed more easily. If you have any dental work done, tell your dentist you are receiving this medicine. What side effects may I notice from receiving this medicine? Side effects that you should report to your doctor or health care professional as soon as possible:  allergic reactions like skin rash, itching or hives; swelling of the face, lips, or tongue  breathing problems  constipation  pain, redness, or irritation at site where injected  signs and symptoms of bleeding such as bloody or black, tarry stools; red or dark brown urine; spitting up blood or brown material that looks like coffee grounds; red spots on the skin; unusual bruising or bleeding from the eyes, gums, or nose  signs and symptoms of infection like fever; chills; cough; sore throat; pain or trouble passing urine  signs and symptoms of liver injury like dark yellow or brown urine; general ill feeling or flu-like symptoms; light-colored stools; loss of appetite; nausea; right upper belly pain; unusually weak or tired; yellowing of the eyes or skin  signs and symptoms of low red blood cells or anemia such as unusually weak or tired; feeling faint or lightheaded; falls; breathing problems  tingling, numbness in the hands or feet Side effects that usually do not require medical attention (report to your doctor or health care professional if they continue or are bothersome):  jaw pain  loss of appetite  nausea, vomiting  stomach pain This list may not describe all possible side effects. Call your doctor for medical advice about side effects.  You may report side effects to FDA at 1-800-FDA-1088. Where should I keep my medicine? This drug is given in a hospital or clinic and will not be stored at home. NOTE: This sheet is a summary. It may not cover all possible information. If you have questions about this medicine, talk to your doctor, pharmacist, or health care provider.  2020 [employer] Standard (2018-01-03 17:49:13)   Gemcitabine injection What is this medicine? GEMCITABINE (jem SYE ta been) is a chemotherapy drug. This medicine is used to treat many types of cancer like breast cancer, lung cancer, pancreatic cancer, and ovarian cancer. This medicine may be used for other purposes; ask your health care provider or pharmacist if you have questions. COMMON BRAND NAME(S): Gemzar, Infugem What should I tell my health care provider before I take this medicine? They need to know if you have any of these conditions:  blood disorders  infection  kidney disease  liver disease  lung or breathing disease, like asthma  recent or ongoing radiation therapy  an unusual or allergic reaction to gemcitabine, other chemotherapy, other medicines, foods, dyes, or preservatives  pregnant or trying to get pregnant  breast-feeding How should I use this medicine? This drug is given as an infusion into a vein. It is administered in a hospital or clinic by a specially trained health care professional. Talk to your pediatrician regarding the use of this medicine in children.  Special care may be needed. Overdosage: If you think you have taken too much of this medicine contact a poison control center or emergency room at once. NOTE: This medicine is only for you. Do not share this medicine with others. What if I miss a dose? It is important not to miss your dose. Call your doctor or health care professional if you are unable to keep an appointment. What may interact with this medicine?  medicines to increase blood counts like  filgrastim, pegfilgrastim, sargramostim  some other chemotherapy drugs like cisplatin  vaccines Talk to your doctor or health care professional before taking any of these medicines:  acetaminophen  aspirin  ibuprofen  ketoprofen  naproxen This list may not describe all possible interactions. Give your health care provider a list of all the medicines, herbs, non-prescription drugs, or dietary supplements you use. Also tell them if you smoke, drink alcohol, or use illegal drugs. Some items may interact with your medicine. What should I watch for while using this medicine? Visit your doctor for checks on your progress. This drug may make you feel generally unwell. This is not uncommon, as chemotherapy can affect healthy cells as well as cancer cells. Report any side effects. Continue your course of treatment even though you feel ill unless your doctor tells you to stop. In some cases, you may be given additional medicines to help with side effects. Follow all directions for their use. Call your doctor or health care professional for advice if you get a fever, chills or sore throat, or other symptoms of a cold or flu. Do not treat yourself. This drug decreases your body's ability to fight infections. Try to avoid being around people who are sick. This medicine may increase your risk to bruise or bleed. Call your doctor or health care professional if you notice any unusual bleeding. Be careful brushing and flossing your teeth or using a toothpick because you may get an infection or bleed more easily. If you have any dental work done, tell your dentist you are receiving this medicine. Avoid taking products that contain aspirin, acetaminophen, ibuprofen, naproxen, or ketoprofen unless instructed by your doctor. These medicines may hide a fever. Do not become pregnant while taking this medicine or for 6 months after stopping it. Women should inform their doctor if they wish to become pregnant or  think they might be pregnant. Men should not father a child while taking this medicine and for 3 months after stopping it. There is a potential for serious side effects to an unborn child. Talk to your health care professional or pharmacist for more information. Do not breast-feed an infant while taking this medicine or for at least 1 week after stopping it. Men should inform their doctors if they wish to father a child. This medicine may lower sperm counts. Talk with your doctor or health care professional if you are concerned about your fertility. What side effects may I notice from receiving this medicine? Side effects that you should report to your doctor or health care professional as soon as possible:  allergic reactions like skin rash, itching or hives, swelling of the face, lips, or tongue  breathing problems  pain, redness, or irritation at site where injected  signs and symptoms of a dangerous change in heartbeat or heart rhythm like chest pain; dizziness; fast or irregular heartbeat; palpitations; feeling faint or lightheaded, falls; breathing problems  signs of decreased platelets or bleeding - bruising, pinpoint red spots on the skin, black,  tarry stools, blood in the urine  signs of decreased red blood cells - unusually weak or tired, feeling faint or lightheaded, falls  signs of infection - fever or chills, cough, sore throat, pain or difficulty passing urine  signs and symptoms of kidney injury like trouble passing urine or change in the amount of urine  signs and symptoms of liver injury like dark yellow or brown urine; general ill feeling or flu-like symptoms; light-colored stools; loss of appetite; nausea; right upper belly pain; unusually weak or tired; yellowing of the eyes or skin  swelling of ankles, feet, hands Side effects that usually do not require medical attention (report to your doctor or health care professional if they continue or are  bothersome):  constipation  diarrhea  hair loss  loss of appetite  nausea  rash  vomiting This list may not describe all possible side effects. Call your doctor for medical advice about side effects. You may report side effects to FDA at 1-800-FDA-1088. Where should I keep my medicine? This drug is given in a hospital or clinic and will not be stored at home. NOTE: This sheet is a summary. It may not cover all possible information. If you have questions about this medicine, talk to your doctor, pharmacist, or health care provider.  2020 [employer]/Gold Standard (2017-10-10 18:06:11)   Docetaxel injection What is this medicine? DOCETAXEL (doe se TAX el) is a chemotherapy drug. It targets fast dividing cells, like cancer cells, and causes these cells to die. This medicine is used to treat many types of cancers like breast cancer, certain stomach cancers, head and neck cancer, lung cancer, and prostate cancer. This medicine may be used for other purposes; ask your health care provider or pharmacist if you have questions. COMMON BRAND NAME(S): Docefrez, Taxotere What should I tell my health care provider before I take this medicine? They need to know if you have any of these conditions:  infection (especially a virus infection such as chickenpox, cold sores, or herpes)  liver disease  low blood counts, like low white cell, platelet, or red cell counts  an unusual or allergic reaction to docetaxel, polysorbate 80, other chemotherapy agents, other medicines, foods, dyes, or preservatives  pregnant or trying to get pregnant  breast-feeding How should I use this medicine? This drug is given as an infusion into a vein. It is administered in a hospital or clinic by a specially trained health care professional. Talk to your pediatrician regarding the use of this medicine in children. Special care may be needed. Overdosage: If you think you have taken too much of this medicine contact  a poison control center or emergency room at once. NOTE: This medicine is only for you. Do not share this medicine with others. What if I miss a dose? It is important not to miss your dose. Call your doctor or health care professional if you are unable to keep an appointment. What may interact with this medicine?  aprepitant  certain antibiotics like erythromycin or clarithromycin  certain antivirals for HIV or hepatitis  certain medicines for fungal infections like fluconazole, itraconazole, ketoconazole, posaconazole, or voriconazole  cimetidine  ciprofloxacin  conivaptan  cyclosporine  dronedarone  fluvoxamine  grapefruit juice  imatinib  verapamil This list may not describe all possible interactions. Give your health care provider a list of all the medicines, herbs, non-prescription drugs, or dietary supplements you use. Also tell them if you smoke, drink alcohol, or use illegal drugs. Some items may interact with  your medicine. What should I watch for while using this medicine? Your condition will be monitored carefully while you are receiving this medicine. You will need important blood work done while you are taking this medicine. Call your doctor or health care professional for advice if you get a fever, chills or sore throat, or other symptoms of a cold or flu. Do not treat yourself. This drug decreases your body's ability to fight infections. Try to avoid being around people who are sick. Some products may contain alcohol. Ask your health care professional if this medicine contains alcohol. Be sure to tell all health care professionals you are taking this medicine. Certain medicines, like metronidazole and disulfiram, can cause an unpleasant reaction when taken with alcohol. The reaction includes flushing, headache, nausea, vomiting, sweating, and increased thirst. The reaction can last from 30 minutes to several hours. You may get drowsy or dizzy. Do not drive, use  machinery, or do anything that needs mental alertness until you know how this medicine affects you. Do not stand or sit up quickly, especially if you are an older patient. This reduces the risk of dizzy or fainting spells. Alcohol may interfere with the effect of this medicine. Talk to your health care professional about your risk of cancer. You may be more at risk for certain types of cancer if you take this medicine. Do not become pregnant while taking this medicine or for 6 months after stopping it. Women should inform their doctor if they wish to become pregnant or think they might be pregnant. There is a potential for serious side effects to an unborn child. Talk to your health care professional or pharmacist for more information. Do not breast-feed an infant while taking this medicine or for 1 week after stopping it. Males who get this medicine must use a condom during sex with females who can get pregnant. If you get a woman pregnant, the baby could have birth defects. The baby could die before they are born. You will need to continue wearing a condom for 3 months after stopping the medicine. Tell your health care provider right away if your partner becomes pregnant while you are taking this medicine. This may interfere with the ability to father a child. You should talk to your doctor or health care professional if you are concerned about your fertility. What side effects may I notice from receiving this medicine? Side effects that you should report to your doctor or health care professional as soon as possible:  allergic reactions like skin rash, itching or hives, swelling of the face, lips, or tongue  blurred vision  breathing problems  changes in vision  low blood counts - This drug may decrease the number of white blood cells, red blood cells and platelets. You may be at increased risk for infections and bleeding.  nausea and vomiting  pain, redness or irritation at site where  injected  pain, tingling, numbness in the hands or feet  redness, blistering, peeling, or loosening of the skin, including inside the mouth  signs of decreased platelets or bleeding - bruising, pinpoint red spots on the skin, black, tarry stools, nosebleeds  signs of decreased red blood cells - unusually weak or tired, fainting spells, lightheadedness  signs of infection - fever or chills, cough, sore throat, pain or difficulty passing urine  swelling of the ankle, feet, hands Side effects that usually do not require medical attention (report to your doctor or health care professional if they continue or are  bothersome):  constipation  diarrhea  fingernail or toenail changes  hair loss  loss of appetite  mouth sores  muscle pain This list may not describe all possible side effects. Call your doctor for medical advice about side effects. You may report side effects to FDA at 1-800-FDA-1088. Where should I keep my medicine? This drug is given in a hospital or clinic and will not be stored at home. NOTE: This sheet is a summary. It may not cover all possible information. If you have questions about this medicine, talk to your doctor, pharmacist, or health care provider.  2020 [employer]/Gold Standard (2019-03-13 10:19:06)

## 2019-12-17 NOTE — Progress Notes (Signed)
Patient has 2 more days of amoxicillin, states that she is doing well since her hospital stay

## 2019-12-21 DIAGNOSIS — G893 Neoplasm related pain (acute) (chronic): Secondary | ICD-10-CM | POA: Insufficient documentation

## 2019-12-23 NOTE — Progress Notes (Signed)
[facility]  [address] [place], [place] [address] Phone: [phone]  Fax: [phone]   Clinic Day:  12/25/2019  Referring physician: Francesca Oman, DO  Chief Complaint: Toni Griffith is a 79 y.o. 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  PI: The patient was last seen in the medical oncology clinic on 12/17/2019. At that time, she felt weak.  She was requiring more assistance.  Exam revealed a recurrent left sided effusion. Hematocrit was 32.4, hemoglobin 10.5, platelets 413,000, WBC 10,300 (ANC 7700).  Albumin was 2.7. Vitamin B12 was 1,032 and folate 9.3.  I discussed plan for single agent chemotherapy (gemcitabine or Navelbine) or possible clinical trial enrollment. She was to be seen in pulmonary medicine at [facility] for consideration of pleurodesis vs Pleur-X catheter on 12/23/2019.   She was seen by Dr Toni Griffith in pulmonary medicine at [facility] on 12/23/2019.  The pulmonologist performed an ultrasound; she had a minimal pleural effusion. Her daughter notes that the pulmonologist did nothing for her. Dr. Alveria Griffith did not ask her to stand up and walk for evaluate her breathing. Her daughter notes she has shortness of breath on exertion and at rest.   Symptomatically, she is feeling "ok".  She is having a hard time breathing.  The interpreter notes she may be out of breath due to having to stand up and down trying to get blood out of her port this morning. She is sleeping a lot better. Her knee pain is improving with injections. Her last knee injection was on 11/28/2019. Her appetite is poor. She is not drinking a lot of fluids. Her son notes she has her moments where she eats well. She notes when her son is cooking she can not stand the smell of things.    Patient has a temperature of 99.4 today.  Her daughter reports she has been running a fever at home. She has episodes of sweats. At times,  she notices that her mom is hot and cold. Patient reports sweating more in the evening. She denies any diarrhea.   Her daughter is the medical power of attorney but does not remember if her mother has a living will. The patient states that she does not want to die. Patient reports "I'm just not ready to go". She wants intervention if the worst case possible happens. Patient's daughter is concerned if she has cancer in the right lung. I reviewed that she has a new 6 mm nodule in the right lower lobe; we are not sure what it is yet. The nodule on the right side is not causing patient any problems compared to the ongoing process on the left side.  Patient agreed to a CXR today.   CXR today was similar to the prior study on 12/05/2019.  She has a mass-like opacity in the left mid lung and ill defined opacities throughout the left mid to lower lung.  There is an 11 mm nodule in the lateral aspect of the right lung.  There is trace left pleural effusion.  Imaging was personally reviewed with the patient , her son, and daughter.  She has a post-obstructive process in the left lung.  This is causing her shortness of breath.  I discussed a plan for antibiotics given her fever and likely post-obstructive pneumonia.  She has previously been seen by Dr. Patsey Griffith. I will arrange follow-up with her.  I discussed starting single  agent chemotherapy next week unless she is eligible for a trial at [facility].  I reviewed by conversation with [facility] (no clinical trial; single agent chemotherapy recommended).  Her daughter noted that she had been Augmentin a week ago.  A prescription for Levaquin was sent to her pharmacy. I further discussed benefits and risks for single agent weekly chemotherapy (gemcitabine or Navelbine).  Families questiones were asked and answered. I encouraged the family to call if any concerns over the holiday weekend.  She will be staying with her family rather than going back home to [facility].  Her magnesium  was 1.5 today.  She declined oral magnesium as she was taking too many pills.  She received IV magnesium in clinic. She requested strawberry and vanilla flavored Ensure samples today.     Past Medical History:  Diagnosis Date  . Arthritis   . Cancer [facility])    lymphoma-right hip  . Deaf   . GERD (gastroesophageal reflux disease)   . Hypertension    NOT ON MED AT PRESENT  . Squamous cell lung cancer [facility])     Past Surgical History:  Procedure Laterality Date  . CARDIAC SURGERY  1993  . CORONARY ARTERY BYPASS GRAFT     DOUBLE  . CYST REMOVAL HAND Right   . ENDOBRONCHIAL ULTRASOUND N/A 05/30/2018   Procedure: ENDOBRONCHIAL ULTRASOUND;  Surgeon: Toni Pita, MD;  Location: [facility] ORS;  Service: Cardiopulmonary;  Laterality: N/A;  . PERIPHERAL VASCULAR CATHETERIZATION N/A 04/06/2016   Procedure: Toni Griffith Cath Insertion;  Surgeon: Toni Huxley, MD;  Location: [place] CV LAB;  Service: Cardiovascular;  Laterality: N/A;    Family History  Problem Relation Age of Onset  . Lung cancer Mother   . Lung cancer Father   . Diabetes Maternal Uncle   . Myasthenia gravis Maternal Grandmother   . Leukemia Maternal Grandfather     Social History:  reports that she quit smoking about 4 years ago. Her smoking use included cigarettes. She has a 80.00 pack-year smoking history. She has never used smokeless tobacco. She reports current alcohol use. She reports that she does not use drugs.  She smoked 1 1/2 packs per day for 20 years (30 pack year smoking history) until 10/2015. She is hearing impaired (deaf). She lives in [place] with her boyfriend. Her daughter lives in [place]. Her daughter, Toni Griffith's contact number is [phone].She has 2 cats named Pepper and Toni Griffith. The patient is accompanied by her son and daughter on Ipad and interpreter Toni Griffith today.  After her CXR, I met with the patient, her son, daughter, interpreter and the scribe in the conference room for further  discussions.  Allergies: No Known Allergies  Current Medications: Current Outpatient Medications  Medication Sig Dispense Refill  . acetaminophen (TYLENOL) 500 MG tablet Take 500 mg by mouth every 6 (six) hours as needed for moderate pain.     Marland Kitchen allopurinol (ZYLOPRIM) 300 MG tablet TAKE 1 TABLET BY MOUTH EVERY DAY 90 tablet 0  . aspirin EC 81 MG tablet Take 81 mg by mouth daily.    Marland Kitchen atorvastatin (LIPITOR) 80 MG tablet Take 80 mg by mouth every evening.     Marland Kitchen b complex vitamins tablet Take 1 tablet by mouth daily.    Carin Hock Iron (IRON CHEWS PEDIATRIC PO) Take 5 mg by mouth daily.    . Cholecalciferol (VITAMIN D3) 2000 units capsule Take 1,000 Units by mouth daily.     Marland Kitchen dextromethorphan 15 MG/5ML syrup  Take 10 mLs by mouth 4 (four) times daily as needed for cough.    . diclofenac Sodium (VOLTAREN) 1 % GEL Apply topically 4 (four) times daily.    . DULoxetine (CYMBALTA) 60 MG capsule     . HYDROcodone-acetaminophen (NORCO) 5-325 MG tablet Take 1/2 - 1 tablet by mouth every 6 hours as needed for severe pain. 30 tablet 0  . lidocaine-prilocaine (EMLA) cream Apply cream 1 hour before chemotherapy treatment and place small peive of saran wrap over cream to protect clothing 30 g 3  . magnesium oxide (MAG-OX) 400 (241.3 Mg) MG tablet Take 1 tablet (400 mg total) by mouth every other day. 30 tablet 0  . Omega-3 Fatty Acids (FISH OIL PO) Take 1 tablet by mouth daily.    . ondansetron (ZOFRAN ODT) 4 MG disintegrating tablet Take 1 tablet (4 mg total) by mouth every 8 (eight) hours as needed for nausea or vomiting. 30 tablet 1  . pantoprazole (PROTONIX) 40 MG tablet Take 40 mg by mouth daily.     . potassium chloride SA (KLOR-CON) 20 MEQ tablet Take 1 tablet (20 mEq total) by mouth daily. Take as directed by MD. 30 tablet 0  . vitamin E 400 UNIT capsule Take 400 Units by mouth daily.      No current facility-administered medications for this visit.   Facility-Administered Medications Ordered in  Other Visits  Medication Dose Route Frequency Provider Last Rate Last Admin  . heparin lock flush 100 unit/mL  500 Units Intravenous Once Meshach Perry C, MD      . sodium chloride flush (NS) 0.9 % injection 10 mL  10 mL Intravenous PRN Nolon Stalls C, MD   10 mL at 04/05/17 1034  . sodium chloride flush (NS) 0.9 % injection 10 mL  10 mL Intravenous PRN Nolon Stalls C, MD   10 mL at 02/27/19 1033    Review of Systems  Constitutional: Positive for chills, diaphoresis (more during the evening time) and fever. Negative for malaise/fatigue and weight loss (stable).       Feels "ok". She needs more assistance at home.  HENT: Positive for hearing loss (deaf). Negative for congestion and sore throat.   Eyes: Negative for blurred vision and double vision.  Respiratory: Positive for shortness of breath (with exertion and at rest). Negative for cough, hemoptysis and sputum production.   Cardiovascular: Negative for chest pain, palpitations and leg swelling.  Gastrointestinal: Negative for abdominal pain, blood in stool, constipation, diarrhea, heartburn, melena, nausea and vomiting.       Poor appetite. Not drinking enough fluids. Some cooking smells cause nausea.  Genitourinary: Negative for dysuria, frequency and urgency.  Musculoskeletal: Positive for back pain (lower back). Negative for falls, joint pain (chronic; knees (L>R); osteoarthritis; s/p corticosteroid injection; improving) and myalgias.  Skin: Negative for itching and rash.  Neurological: Positive for sensory change (baseline neuropathy unchanged; left hand 2nd and 3rd digits. neuropathy in sole of feet unchanged). Negative for dizziness, weakness and headaches.  Endo/Heme/Allergies: Negative for environmental allergies and polydipsia. Does not bruise/bleed easily.       Hot flashes at times  Psychiatric/Behavioral: Negative for depression and memory loss. The patient is nervous/anxious (does not want to die). The patient does  not have insomnia.   All other systems reviewed and are negative.  Performance status (ECOG):  2-3  Vitals Blood pressure 105/84, pulse (!) 113, temperature 99.4 F (37.4 C), temperature source Oral, resp. rate (!) 24, height _0  (1.6 m),  weight 166 lb (75.3 kg), SpO2 96 %.   Physical Exam  Constitutional: She is oriented to person, place, and time. She appears well-developed and well-nourished. Face mask in place.  Patient is sitting comfortably in the exam room in no acute distress.  She is examined in wheelchair.  HENT:  Head: Normocephalic and atraumatic.  Mouth/Throat: Oropharynx is clear and moist and mucous membranes are normal. No oral lesions. No oropharyngeal exudate.  Eyes: Pupils are equal, round, and reactive to light. Conjunctivae and EOM are normal. Right eye exhibits no discharge. Left eye exhibits no discharge. No scleral icterus.  Hazel eyes.  Neck: No JVD present.  Cardiovascular: Normal rate, regular rhythm and normal heart sounds. Exam reveals no gallop and no friction rub.  No murmur heard. Pulmonary/Chest: Effort normal and breath sounds normal. She has no wheezes. She has no rhonchi.  Decreased breath sound left lung 1/3 to 1/2 way up with fine crackles above dullness.  Abdominal: Soft. Normal appearance and bowel sounds are normal. She exhibits no distension and no mass. There is no hepatosplenomegaly. There is no abdominal tenderness. There is no rebound and no guarding.  Musculoskeletal:        General: No tenderness or edema. Normal range of motion.     Cervical back: Normal range of motion and neck supple.     Left knee: No swelling.  Lymphadenopathy:       Head (right side): No preauricular, no posterior auricular and no occipital adenopathy present.       Head (left side): No preauricular, no posterior auricular and no occipital adenopathy present.    She has no cervical adenopathy.       Right cervical: No superficial cervical adenopathy present.       Left cervical: No superficial cervical adenopathy present.    She has no axillary adenopathy.       Right: No inguinal and no supraclavicular adenopathy present.       Left: No inguinal and no supraclavicular adenopathy present.  Neurological: She is alert and oriented to person, place, and time.  Skin: Skin is warm, dry and intact. No bruising, no lesion and no rash noted. She is not diaphoretic. No erythema. No pallor.  Psychiatric: She has a normal mood and affect. Her behavior is normal. Judgment and thought content normal.  Nursing note and vitals reviewed.   Imaging studies: 03/03/2016:  PET scanrevealed a hypermetabolic left lower lobe mass, bilateral pleural and pulmonary parenchymal nodular metastasis, a large pleural lesion invading the anterior left chest wall. There was activity in the superior left ocular orbit. There was a large lesion centered in the right iliac wing with soft tissue extension. There were hypermetabolic right external iliac lymph nodes 01/29/2017:  PET scanrevealed interval progression of hypermetabolic nodules in the right upper (13 mm compared to 9 mm; SUV 10) and left lower lobes (26 mm compared to 22 mm; SUV 7.5). There was new hypermetabolic focus of activity along the anterior left pleura although no underlying pleural or lung mass was evident. 06/06/2019:  Chest, abdomen, and pelvis CTrevealed an interval mass-like opacity in the superior aspect of the left parahilar lungwith an interval more prominent mass-like component involving the pleura laterally at the level with slightly more prominent extension into the left hila region, suspicious for recurrent or progressive lung cancer. There was mediastinal and bilateral hilar adenopathyc/wmetastatic adenopathy or involvement with the patient's B-cell lymphoma. There was no evidence of metastatic disease or lymphoma involving the abdomen  or pelvis. There was significantly improved ground-glass opacities in  the right lung, minimal left pleural fluid(improved), andastable large hiatal hernia.There was stable enlarged thyroid gland containing heterogeneous nodules with substernal extension on the left.There was stable to slightly decreased size of the previously demonstrated right adrenal nodule and no significant change in a small, poorly visualized left adrenal nodule. 06/17/2019:  PET scanrevealed a known left parahilar lung mass markedly hypermetabolic with associated hypermetabolic lymphadenopathy in both hilar regions, the mediastinum, andthe right thoracic inlet. There was hypermetabolism in the right submandibular gland, indeterminate. There was no hypermetabolic metastatic disease evident in the abdomen or pelvis. There was a stable bilateral adrenal adenomas since 01/29/2018. There were tiny left pleural effusion and a large hiatal hernia.There was a segment of transverse colon extendingup into the hiatal hernia without obstructive features.  12/02/2019:  Chest, abdomen and pelvis CT revealed interval resolution of mediastinal and hilar adenopathy c/w response to therapy. 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 There was increased moderate-sized dependent left pleural effusion with increased left lower lobe compressive atelectasis. There was a new 6 mm nodule in the superior segment of the right lower lobe, nonspecific, but potentially a metastasis. Attention on follow-up was recommended.  There was no evidence of metastatic disease in the abdomen or pelvis.  There was a moderate-sized hiatal hernia and bilateral adrenal adenomas.    Infusion on 12/25/2019  Component Date Value Ref Range Status  . WBC 12/25/2019 11.3* 4.0 - 10.5 K/uL Final  . RBC 12/25/2019 3.25* 3.87 - 5.11 MIL/uL Final  . Hemoglobin 12/25/2019 10.4* 12.0 - 15.0 g/dL Final  . HCT 12/25/2019 32.3* 36.0 - 46.0 %  Final  . MCV 12/25/2019 99.4  80.0 - 100.0 fL Final  . MCH 12/25/2019 32.0  26.0 - 34.0 pg Final  . MCHC 12/25/2019 32.2  30.0 - 36.0 g/dL Final  . RDW 12/25/2019 17.9* 11.5 - 15.5 % Final  . Platelets 12/25/2019 424* 150 - 400 K/uL Final  . nRBC 12/25/2019 0.0  0.0 - 0.2 % Final  . Neutrophils Relative % 12/25/2019 76  % Final  . Neutro Abs 12/25/2019 8.6* 1.7 - 7.7 K/uL Final  . Lymphocytes Relative 12/25/2019 10  % Final  . Lymphs Abs 12/25/2019 1.1  0.7 - 4.0 K/uL Final  . Monocytes Relative 12/25/2019 12  % Final  . Monocytes Absolute 12/25/2019 1.3* 0.1 - 1.0 K/uL Final  . Eosinophils Relative 12/25/2019 1  % Final  . Eosinophils Absolute 12/25/2019 0.1  0.0 - 0.5 K/uL Final  . Basophils Relative 12/25/2019 0  % Final  . Basophils Absolute 12/25/2019 0.1  0.0 - 0.1 K/uL Final  . Immature Granulocytes 12/25/2019 1  % Final  . Abs Immature Granulocytes 12/25/2019 0.16* 0.00 - 0.07 K/uL Final   Performed at [facility], [address] [place], [place] [address]    Assessment:  Sherolyn Trettin is a 79 y.o. female withstage IVB B-cell lymphoma, unclassifiable, with features intermediate between diffuse large B-cell lymphoma and classical Hodgkin's lymphoma ("gray zone" lymphoma). She underwent right iliac wing bone/bone marrow biopsy on 03/08/2016.  PET scanon 03/03/2016 revealed a hypermetabolic left lower lobe mass, bilateral pleural and pulmonary parenchymal nodular metastasis, a large pleural lesion invading the anterior left chest wall. There was activity in the superior left ocular orbit. There was a large lesion centered in the right iliac wing with soft tissue extension. There were hypermetabolic right external  iliac lymph nodes  [place] 04/04/2016 revealed an EF of 55-60%. Hepatitis B and C testing was negative on 03/30/2016. G6PD assay was normal. She declined LP with MTX prophylaxis.  She received 6 cycles of mini-RCHOP(04/07/2016 -  07/21/2016). She has had some transient fingertip numbness.  She received radiationto the right iliac wing, from 10/11/2016 - 11/01/2016. She received 3000 cGy over 3 weeks.  She has squamous cell lung cancer. CT guided left lower lobe nodule biopsyon 02/19/2017 confirmed squamous cell carcinoma. She underwent SBRT08/27/2018 - 04/09/2017. She received 5000 cGy in 5 fractions to the left lower lobe lesion. She underwent SBRTto a RUL lesion from 05/23/2017 - 06/18/2017.  CT guided left upper lobe nodule biopsyon 02/20/2018 revealedsquamous cell lung cancer. She received SBRTfrom 04/09/2018 - 04/15/2018. She received 6000 cGy in 5 fractions.   Foundation Oneon the lung biopsy on 03/15/2018 revealed MS-stable, tumor mutational burden 5 Muts/Mb, AKT2 amplification, CCNE1 amplification, CREBBP R1446L, KDR amplification, KIT amplification, PDGFRA amplification, PTEN loss exons 2-4, and NL97 splice site 673+4L>P. Marland Kitchen There were no alterations in EGFR, KRAS, RET, ALK, MET, ERBB2, BRAF, ROS1. PDL-1IHC analysis revealed a TPS score of 50%.  Bronchoscopyon 05/30/2018 revealed no endobronchial lesions. The 2 cm pre-carinal lymph node was biopsied. Pathology revealed metastatic non-small cell carcinoma, favor squamous cell carcinoma.  CT guided right paraspinal lesion biopsyon 10/16/2017 was compatible with involvement by the patient's previously diagnosed lymphoma. The case was referred to [facility] hematopathology. The neoplastic cells were largely CD30 negative. Repeat CD30 immunohistochemistry faintly stained >10% of the large atypical cells. CD20 stained the scattered large atypical cells and few small cells.   She received 3 cycles of brentuximab vedotin(11/23/2017 - 01/11/2018).Her lymphoma progressed.  Shereceived14cycles of pembrolizumab(06/14/2018 -05/21/2019).   She received 4 cycles ofpembolizumab, carboplatin and Abraxane (08/29/2019 - 11/05/2019). She experienced  tinnitus after her first day of chemotherapy. Tinnitus was minor with cycle #2.  Shereceived4000cGyto the left hilar regionfrom12/14/2020 -08/15/2019.  Chest, abdomen and pelvis CT on 12/02/2019 revealed interval resolution of mediastinal and hilar adenopathy c/w response to therapy. 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 There was increased moderate-sized dependent left pleural effusion with increased left lower lobe compressive atelectasis. There was a new 6 mm nodule in the superior segment of the right lower lobe, nonspecific, but potentially a metastasis. Attention on follow-up was recommended.  There was no evidence of metastatic disease in the abdomen or pelvis.  There was a moderate-sized hiatal hernia and bilateral adrenal adenomas.   Ultrasound guided left thoracentesis on 12/05/2019 revealed non-small cell carcinoma.   She hasiron deficiency anemia. Ferritinhas been followed: 14 on 05/06/2018,23 on 07/26/2018, and 59 on 11/01/2018. She denies any bleeding.  She has a grade I neuropathy. B12 and folate were normal on 12/06/2017. She has severe bilateral knee osteoarthritis. She has received steroid injections and Monovisc (hyaluronate derivatives) without improvement.   She is deafand requires an interpreter.  Symptomatically, she has shortness of breath and a low grade fever.  She has a post-obstructive process in the left lung with minimal pleural fluid.  Plan: 1.   Labs today: CBC with diff, CMP, Mg. 2.Gray zone lymphoma Clinically, her lymphoma appears controlled. Exam reveals no adenopathy or hepatosplenomegaly. She has received 6 cycles of mini-RCOP, 3 cycles ofbrentuximab vedotin, and 16cycles of pembrolizumab. PET scan on 06/17/2019 revealed progressive disease in the chest felt secondary toprogressive lung cancer. She is  s/p5additional cycle of maintenance pembrolizumab (last 11/05/2019). Review plan to  restart pembrolizumab next week for ongoing maintenance treatment for recurrent gray zone lymphoma. 3. Clinical stage IV squamous cell lung cancer Clinically, she has a post-obstructive process in the left lung. Patient is s/p SBRT to left upper lobe and left lower lobe nodules. Patient is s/p 15cyclesof pembrolizumab. Patient is s/p palliative radiation to the right hilar region (completed 08/15/2019). She is s/p4cycles of carboplatin and Abraxane (08/29/2019 - 11/05/2019). Chest, abdomen and pelvis CT scan on 12/02/2019 revealed a new moderate left-sided effusion.  Thoracentesis on 12/05/2019 confirmed non-small cell lung cancer.  No clinical trial available at [facility] with recommendation for single agent chemotherapy.  Await follow-up from [facility] regarding treatment options.  Contact family after follow-up.  Review plan for single agent chemotherapy (gemcitabine or Navelbine) rather than Taxotere given her performance status and recent Abraxane.  Discuss plan for gemcitabine 1000 mg/m2 weekly x 2 every 3 weeks.   Potential side effects re-reviewed.   Review response rates. Treatment is palliative. 4.Recurrent left sided pleural effusion  Patient is s/p thoracentesis on 12/05/2019 which confirmed non-small cell lung cancer.  Patient was seen by pulmonary medicine at [facility] on 12/23/2019 for consideration of pleurodesis vs Pleur-X catheter.   No intervention was performed as fluid had not reaccumulated.  Discuss plan for follow-up with Dr Toni Griffith- contacted.  Discuss consideration of Pleur-X catheter if fluid reaccumulates.  Per Dr Toni Griffith, stent would not be benificial as obstruction is too distal. 5.   Post obstructive pneumonia  CXR personally reviewed.  Agree with radiology interpretation.  Patient with recurrent low grade temperature.  Rx:  Levaquin 500 mg po q  day. 6.   Chemotherapy-induced neuropathy Neuropathy is stable. 7. Cancer related pain  Pain appears well controlled.    She has pain medications at home to use on a prn bases.  Interventions are adequate.   8.   Hypomagnesemia Magnesium 1.5. patient declines oral magnesium.  Magnesium 2 gm IV today. 9.   Code status  I discussed medical power of attorney and living will.  I discuss code status.  They patient wished to have everything done.  Code status is FULL. 10.   CXR (PA and and lateral) and left lateral decubitus. 11.   RTC after imaging studies. 12.   RTC on 12/31/2019 for MD assessment, labs (CBC with diff, CMP, Mg), gemcitabine, and pembrolizumab.  I discussed the assessment and treatment plan with the patient.  The patient was provided an opportunity to ask questions and all were answered.  The patient agreed with the plan and demonstrated an understanding of the instructions.  The patient was advised to call back if the symptoms worsen or if the condition fails to improve as anticipated.  I provided an initial 25 minutes of face-to-face time during this this encounter and > 50% was spent counseling as documented under my assessment and plan.  An additional 10 minutes were spent reviewing her chart (Epic and Care Everywhere) including notes, labs, and imaging studies. An additional 15 minutes were with the patient and her family for a second conference after her CXR.  In addition, I spoke with Dr Toni Griffith.     Lequita Asal, MD, PhD    12/25/2019, 9:14 AM  I, Selena Batten, am acting as scribe for [employer]. Mike Gip, MD, PhD.  I, Doneisha Ivey C. Mike Gip, MD, have reviewed the above documentation for accuracy and completeness, and I agree with the above.

## 2019-12-24 ENCOUNTER — Ambulatory Visit: Payer: Medicare HMO

## 2019-12-24 ENCOUNTER — Other Ambulatory Visit: Payer: Self-pay | Admitting: Hematology and Oncology

## 2019-12-24 ENCOUNTER — Encounter: Payer: Self-pay | Admitting: Hematology and Oncology

## 2019-12-24 NOTE — Progress Notes (Signed)
Patient was called for pre assessment. Spoke with daughter, who was there with patient. She denied any pain or concerns at this time.

## 2019-12-25 ENCOUNTER — Encounter: Payer: Self-pay | Admitting: Hematology and Oncology

## 2019-12-25 ENCOUNTER — Inpatient Hospital Stay (HOSPITAL_BASED_OUTPATIENT_CLINIC_OR_DEPARTMENT_OTHER): Payer: Medicare HMO | Admitting: Hematology and Oncology

## 2019-12-25 ENCOUNTER — Ambulatory Visit
Admission: RE | Admit: 2019-12-25 | Discharge: 2019-12-25 | Disposition: A | Discharge status: Home / Self Care | Payer: Medicare HMO | Attending: Hematology and Oncology | Admitting: Hematology and Oncology

## 2019-12-25 ENCOUNTER — Ambulatory Visit
Admission: RE | Admit: 2019-12-25 | Discharge: 2019-12-25 | Disposition: A | Discharge status: Home / Self Care | Payer: Medicare HMO | Source: Ambulatory Visit | Attending: Hematology and Oncology | Admitting: Hematology and Oncology

## 2019-12-25 ENCOUNTER — Inpatient Hospital Stay: Payer: Medicare HMO

## 2019-12-25 ENCOUNTER — Other Ambulatory Visit: Payer: Self-pay | Admitting: Orthopedic Surgery

## 2019-12-25 ENCOUNTER — Other Ambulatory Visit: Payer: Self-pay

## 2019-12-25 VITALS — BP 105/84 | HR 113 | Temp 99.4°F | Resp 24 | Ht 63.0 in | Wt 166.0 lb

## 2019-12-25 DIAGNOSIS — C7951 Secondary malignant neoplasm of bone: Secondary | ICD-10-CM

## 2019-12-25 DIAGNOSIS — G893 Neoplasm related pain (acute) (chronic): Secondary | ICD-10-CM

## 2019-12-25 DIAGNOSIS — C3490 Malignant neoplasm of unspecified part of unspecified bronchus or lung: Secondary | ICD-10-CM

## 2019-12-25 DIAGNOSIS — J90 Pleural effusion, not elsewhere classified: Secondary | ICD-10-CM

## 2019-12-25 DIAGNOSIS — C8518 Unspecified B-cell lymphoma, lymph nodes of multiple sites: Secondary | ICD-10-CM

## 2019-12-25 DIAGNOSIS — E538 Deficiency of other specified B group vitamins: Secondary | ICD-10-CM

## 2019-12-25 DIAGNOSIS — G620 Drug-induced polyneuropathy: Secondary | ICD-10-CM | POA: Diagnosis not present

## 2019-12-25 DIAGNOSIS — R0602 Shortness of breath: Secondary | ICD-10-CM

## 2019-12-25 DIAGNOSIS — Z5111 Encounter for antineoplastic chemotherapy: Secondary | ICD-10-CM

## 2019-12-25 DIAGNOSIS — Z5112 Encounter for antineoplastic immunotherapy: Secondary | ICD-10-CM

## 2019-12-25 DIAGNOSIS — Z7189 Other specified counseling: Secondary | ICD-10-CM

## 2019-12-25 DIAGNOSIS — T451X5A Adverse effect of antineoplastic and immunosuppressive drugs, initial encounter: Secondary | ICD-10-CM

## 2019-12-25 DIAGNOSIS — C8590 Non-Hodgkin lymphoma, unspecified, unspecified site: Secondary | ICD-10-CM

## 2019-12-25 DIAGNOSIS — C8338 Diffuse large B-cell lymphoma, lymph nodes of multiple sites: Secondary | ICD-10-CM | POA: Diagnosis not present

## 2019-12-25 LAB — CBC WITH DIFFERENTIAL/PLATELET
Abs Immature Granulocytes: 0.16 10*3/uL — ABNORMAL HIGH (ref 0.00–0.07)
Basophils Absolute: 0.1 10*3/uL (ref 0.0–0.1)
Basophils Relative: 0 %
Eosinophils Absolute: 0.1 10*3/uL (ref 0.0–0.5)
Eosinophils Relative: 1 %
HCT: 32.3 % — ABNORMAL LOW (ref 36.0–46.0)
Hemoglobin: 10.4 g/dL — ABNORMAL LOW (ref 12.0–15.0)
Immature Granulocytes: 1 %
Lymphocytes Relative: 10 %
Lymphs Abs: 1.1 10*3/uL (ref 0.7–4.0)
MCH: 32 pg (ref 26.0–34.0)
MCHC: 32.2 g/dL (ref 30.0–36.0)
MCV: 99.4 fL (ref 80.0–100.0)
Monocytes Absolute: 1.3 10*3/uL — ABNORMAL HIGH (ref 0.1–1.0)
Monocytes Relative: 12 %
Neutro Abs: 8.6 10*3/uL — ABNORMAL HIGH (ref 1.7–7.7)
Neutrophils Relative %: 76 %
Platelets: 424 10*3/uL — ABNORMAL HIGH (ref 150–400)
RBC: 3.25 MIL/uL — ABNORMAL LOW (ref 3.87–5.11)
RDW: 17.9 % — ABNORMAL HIGH (ref 11.5–15.5)
WBC: 11.3 10*3/uL — ABNORMAL HIGH (ref 4.0–10.5)
nRBC: 0 % (ref 0.0–0.2)

## 2019-12-25 LAB — COMPREHENSIVE METABOLIC PANEL
ALT: 14 U/L (ref 0–44)
AST: 21 U/L (ref 15–41)
Albumin: 2.5 g/dL — ABNORMAL LOW (ref 3.5–5.0)
Alkaline Phosphatase: 77 U/L (ref 38–126)
Anion gap: 8 (ref 5–15)
BUN: 15 mg/dL (ref 8–23)
CO2: 23 mmol/L (ref 22–32)
Calcium: 8.9 mg/dL (ref 8.9–10.3)
Chloride: 102 mmol/L (ref 98–111)
Creatinine, Ser: 0.77 mg/dL (ref 0.44–1.00)
GFR calc Af Amer: >60 mL/min (ref >60)
GFR calc non Af Amer: >60 mL/min (ref >60)
Glucose, Bld: 101 mg/dL — ABNORMAL HIGH (ref 70–99)
Potassium: 4 mmol/L (ref 3.5–5.1)
Sodium: 133 mmol/L — ABNORMAL LOW (ref 135–145)
Total Bilirubin: 0.6 mg/dL (ref 0.3–1.2)
Total Protein: 6.6 g/dL (ref 6.5–8.1)

## 2019-12-25 LAB — MAGNESIUM: Magnesium: 1.5 mg/dL — ABNORMAL LOW (ref 1.7–2.4)

## 2019-12-25 MED ORDER — SODIUM CHLORIDE 0.9 % IV SOLN
Freq: Once | INTRAVENOUS | Status: AC
  Filled 2019-12-25: qty 250

## 2019-12-25 MED ORDER — MAGNESIUM SULFATE 2 GM/50ML IV SOLN
2.0000 g | Freq: Once | INTRAVENOUS | Status: AC
  Administered 2019-12-25: 2 g via INTRAVENOUS
  Filled 2019-12-25: qty 50

## 2019-12-25 MED ORDER — HEPARIN SOD (PORK) LOCK FLUSH 100 UNIT/ML IV SOLN
500.0000 [IU] | Freq: Once | INTRAVENOUS | Status: AC | PRN
  Administered 2019-12-25: 500 [IU]
  Filled 2019-12-25: qty 5

## 2019-12-25 MED ORDER — LEVOFLOXACIN 500 MG PO TABS
500.0000 mg | ORAL_TABLET | Freq: Every day | ORAL | 0 refills | Status: DC
Start: 2019-12-25 — End: 2020-01-09

## 2019-12-25 NOTE — Progress Notes (Signed)
[facility] sign lang. Interpreter.

## 2019-12-27 ENCOUNTER — Other Ambulatory Visit: Payer: Self-pay | Admitting: Hematology and Oncology

## 2019-12-30 ENCOUNTER — Telehealth: Payer: Self-pay | Admitting: *Deleted

## 2019-12-30 NOTE — Telephone Encounter (Signed)
  We can see the patient on Thursday or Friday.  M

## 2019-12-30 NOTE — Progress Notes (Addendum)
[facility]  [address], [address] [place], [place] [address] Phone: [phone]  Fax: [phone]   Clinic Day:  01/01/2020  Referring physician: Francesca Oman, DO  Chief Complaint: Toni Griffith is a 79 y.o. 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   HPI: The patient was last seen in the medical oncology clinic on 12/25/2019. At that time, she had shortness of breath and a low grade fever.  She had a post-obstructive process in the left lung with minimal pleural fluid. She was prescribed Levaquin.  We discussed the plan for single agent chemotherapy.  I discussed a follow up with Dr. Patsey Berthold. Pleur-X-catheter was discussed if her pleural fluid reaccumulated.  Hematocrit was 32.3, hemoglobin 10.4, platelets 424,000, WBC 11,300 (ANC 8,600). Sodium was 133, albumin 2.5, and magnesium was 1.5.  She declined oral magnesium. She received magnesium 2 gm IV.   CXR on 12/25/2019 showed a very similar CXR to the prior study on 12/05/2019 with chronic changes of known lung cancer and postradiation changes. There was trace left pleural effusion. Left chest decubitus on 12/25/2019 revealed stable left midlung and left basilar opacification. There was no definite evidence of free-flowing left pleural effusion. Linear density right midlung unchanged likely atelectasis in stable right midlung nodule.  She saw Dr Patsey Berthold on 12/31/2019. Dr. Patsey Berthold recommended a nebulizer QID. She changed her reflux medication dosage. She discussed palliative care. Follow up planned for 02/18/2020.   During the interim, her daughter reported this past week she had been up and down. Dr. Patsey Berthold changed her acid reflux medication. She is taking Zofran as needed for nausea. Her daughter notes she took Zofran twice in 1 week. She will finish her antibiotics on  01/05/2020. She denies any fevers.   She has had issues with emesis when eating. Her daughter reports that she has not been able to keep her food down. The patient is drinking fluids; her son and daughter feel like she is not drinking enough fluids. She is urinating 3-4 times a day. Her son is pushing for her to increase her fluid intake by giving her decaf tea and juices.  She tends to sleep majority of the day and watch TV all day. I encouraged her to be more active. She feels tired all the time. She has shortness of breath with minimal exertion. She is practicing deep breaths to control her breathing. She continues to need minimal assistance with ALDs. Her daughter helps her put pants on because she is worried she will fall. Her neuropathy is the unchanged. Her knees are better. She does not have constant knee pain. She is not taking the pain medication prescribed.   The patient's daughter had some concerns about palliative care after discussion with Dr. Patsey Berthold. I informed patient and family about palliative care services as an adjuvant to treatment. Patient does not want hospice. Family agreed to palliative care.   Patient consented to treatment today. Patient would like to go home; she will have her boyfriend with her.    Past Medical History:  Diagnosis Date  . Arthritis   . Cancer [facility])    lymphoma-right hip  . Deaf   . GERD (gastroesophageal reflux disease)   . Hypertension    NOT ON MED AT PRESENT  . Squamous cell lung cancer [facility])     Past Surgical History:  Procedure  Laterality Date  . CARDIAC SURGERY  1993  . CORONARY ARTERY BYPASS GRAFT     DOUBLE  . CYST REMOVAL HAND Right   . ENDOBRONCHIAL ULTRASOUND N/A 05/30/2018   Procedure: ENDOBRONCHIAL ULTRASOUND;  Surgeon: Tyler Pita, MD;  Location: [facility] ORS;  Service: Cardiopulmonary;  Laterality: N/A;  . PERIPHERAL VASCULAR CATHETERIZATION N/A 04/06/2016   Procedure: Glori Luis Cath Insertion;  Surgeon: Algernon Huxley, MD;   Location: [place] CV LAB;  Service: Cardiovascular;  Laterality: N/A;    Family History  Problem Relation Age of Onset  . Lung cancer Mother   . Lung cancer Father   . Diabetes Maternal Uncle   . Myasthenia gravis Maternal Grandmother   . Leukemia Maternal Grandfather     Social History:  reports that she quit smoking about 4 years ago. Her smoking use included cigarettes. She has a 80.00 pack-year smoking history. She has never used smokeless tobacco. She reports current alcohol use. She reports that she does not use drugs.  She smoked 1 1/2 packs per day for 20 years (30 pack year smoking history) until 10/2015. She is hearing impaired (deaf). She lives in [place] with her boyfriend. Her daughter lives in [place]. Her daughter, [name]'s contact number is [phone].She has 2 cats named Pepper and Baby. The patient is accompanied by her son and daughter on Ipad and interpreter Anderson Malta today.   Allergies: No Known Allergies  Current Medications: Current Outpatient Medications  Medication Sig Dispense Refill  . acetaminophen (TYLENOL) 500 MG tablet Take 500 mg by mouth every 6 (six) hours as needed for moderate pain.     Marland Kitchen allopurinol (ZYLOPRIM) 300 MG tablet TAKE 1 TABLET BY MOUTH EVERY DAY 90 tablet 0  . aspirin EC 81 MG tablet Take 81 mg by mouth daily.    Marland Kitchen b complex vitamins tablet Take 1 tablet by mouth daily.    Carin Hock Iron (IRON CHEWS PEDIATRIC PO) Take 5 mg by mouth daily.    . Cholecalciferol (VITAMIN D3) 2000 units capsule Take 1,000 Units by mouth daily.     Marland Kitchen dextromethorphan 15 MG/5ML syrup Take 10 mLs by mouth 4 (four) times daily as needed for cough.    . diclofenac Sodium (VOLTAREN) 1 % GEL Apply topically 4 (four) times daily.    . DULoxetine (CYMBALTA) 60 MG capsule     . HYDROcodone-acetaminophen (NORCO) 5-325 MG tablet Take 1/2 - 1 tablet by mouth every 6 hours as needed for severe pain. 30 tablet 0  . ipratropium-albuterol (DUONEB) 0.5-2.5 (3)  MG/3ML SOLN Take 3 mLs by nebulization every 4 (four) hours as needed. 360 mL 3  . levofloxacin (LEVAQUIN) 500 MG tablet Take 1 tablet (500 mg total) by mouth daily. 10 tablet 0  . lidocaine-prilocaine (EMLA) cream Apply cream 1 hour before chemotherapy treatment and place small peive of saran wrap over cream to protect clothing 30 g 3  . Omega-3 Fatty Acids (FISH OIL PO) Take 1 tablet by mouth daily.    . ondansetron (ZOFRAN ODT) 4 MG disintegrating tablet Take 1 tablet (4 mg total) by mouth every 8 (eight) hours as needed for nausea or vomiting. 30 tablet 1  . pantoprazole (PROTONIX) 40 MG tablet Take 1 tablet (40 mg total) by mouth daily. 30 tablet 2  . vitamin E 400 UNIT capsule Take 400 Units by mouth daily.     Marland Kitchen atorvastatin (LIPITOR) 80 MG tablet Take 80 mg by mouth every evening.  No current facility-administered medications for this visit.   Facility-Administered Medications Ordered in Other Visits  Medication Dose Route Frequency Provider Last Rate Last Admin  . heparin lock flush 100 unit/mL  500 Units Intravenous Once Kathyleen Radice C, MD      . heparin lock flush 100 unit/mL  500 Units Intravenous Once Nashawn Hillock C, MD      . sodium chloride flush (NS) 0.9 % injection 10 mL  10 mL Intravenous PRN Nolon Stalls C, MD   10 mL at 04/05/17 1034  . sodium chloride flush (NS) 0.9 % injection 10 mL  10 mL Intravenous PRN Lequita Asal, MD   10 mL at 02/27/19 1033  . sodium chloride flush (NS) 0.9 % injection 10 mL  10 mL Intravenous PRN Nolon Stalls C, MD   10 mL at 01/01/20 0930    Review of Systems  Constitutional: Positive for malaise/fatigue (majority of the day) and weight loss (3 lbs). Negative for chills, diaphoresis (more during the evening time) and fever.       Having ups and downs this past week. She needs more assistance at home. Not active.  HENT: Positive for hearing loss (deaf). Negative for congestion and sore throat.   Eyes: Negative for  blurred vision and double vision.  Respiratory: Positive for shortness of breath (with minimal exertion). Negative for cough, hemoptysis and sputum production.   Cardiovascular: Negative for chest pain, palpitations and leg swelling.  Gastrointestinal: Positive for heartburn (reflux), nausea (on zofran as needed) and vomiting. Negative for abdominal pain, blood in stool, constipation, diarrhea and melena.       Poor appetite. Son feels she is not drinking enough fluids.  Genitourinary: Negative for dysuria, frequency and urgency.       Urinating 3-4 times per day.  Musculoskeletal: Positive for back pain (lower back) and joint pain (chronic; knees (L>R); osteoarthritis; s/p corticosteroid injection; better). Negative for falls and myalgias.  Skin: Negative for itching and rash.  Neurological: Positive for sensory change (baseline neuropathy unchanged; left hand 2nd and 3rd digits. neuropathy in sole of feet unchanged). Negative for dizziness, weakness and headaches.  Endo/Heme/Allergies: Negative for environmental allergies and polydipsia. Does not bruise/bleed easily.       Hot flashes at times  Psychiatric/Behavioral: Negative for depression and memory loss. The patient is not nervous/anxious and does not have insomnia.   All other systems reviewed and are negative.  Performance status (ECOG):  2-3  Vitals Blood pressure 117/77, pulse (!) 108, temperature (!) 95.4 F (35.2 C), temperature source Tympanic, weight 163 lb 8 oz (74.2 kg), SpO2 96 %.   Physical Exam  Constitutional: She is oriented to person, place, and time. She appears well-developed and well-nourished. Face mask in place.  Patient is sitting comfortably in the exam room in no acute distress.  She is examined in wheelchair.  HENT:  Head: Normocephalic and atraumatic.  Mouth/Throat: Oropharynx is clear and moist and mucous membranes are normal. No oral lesions. No oropharyngeal exudate.  Eyes: Pupils are equal, round, and  reactive to light. Conjunctivae and EOM are normal. Right eye exhibits no discharge. Left eye exhibits no discharge. No scleral icterus.  Hazel eyes.  Neck: No JVD present.  Cardiovascular: Regular rhythm and normal heart sounds. Exam reveals no gallop and no friction rub.  No murmur heard. Pulmonary/Chest: Effort normal and breath sounds normal. She has no wheezes. She has no rhonchi.  Decreased breath sound left lung 1/3 to 1/2 way up with  fine crackles on deep inspiration above dullness- stable.  Abdominal: Soft. Normal appearance and bowel sounds are normal. She exhibits no distension and no mass. There is no hepatosplenomegaly. There is no abdominal tenderness. There is no rebound and no guarding.  Musculoskeletal:        General: No tenderness or edema. Normal range of motion.     Cervical back: Normal range of motion and neck supple.     Left knee: No swelling.  Lymphadenopathy:       Head (right side): No preauricular, no posterior auricular and no occipital adenopathy present.       Head (left side): No preauricular, no posterior auricular and no occipital adenopathy present.    She has no cervical adenopathy.    She has no axillary adenopathy.       Right: No inguinal and no supraclavicular adenopathy present.       Left: No inguinal and no supraclavicular adenopathy present.  Neurological: She is alert and oriented to person, place, and time.  Skin: Skin is warm, dry and intact. No bruising, no lesion and no rash noted. She is not diaphoretic. No erythema. No pallor.  Psychiatric: She has a normal mood and affect. Her behavior is normal. Judgment and thought content normal.  Nursing note and vitals reviewed.   Imaging studies: 03/03/2016:  PET scanrevealed a hypermetabolic left lower lobe mass, bilateral pleural and pulmonary parenchymal nodular metastasis, a large pleural lesion invading the anterior left chest wall. There was activity in the superior left ocular orbit.  There was a large lesion centered in the right iliac wing with soft tissue extension. There were hypermetabolic right external iliac lymph nodes 01/29/2017:  PET scanrevealed interval progression of hypermetabolic nodules in the right upper (13 mm compared to 9 mm; SUV 10) and left lower lobes (26 mm compared to 22 mm; SUV 7.5). There was new hypermetabolic focus of activity along the anterior left pleura although no underlying pleural or lung mass was evident. 06/06/2019:  Chest, abdomen, and pelvis CTrevealed an interval mass-like opacity in the superior aspect of the left parahilar lungwith an interval more prominent mass-like component involving the pleura laterally at the level with slightly more prominent extension into the left hila region, suspicious for recurrent or progressive lung cancer. There was mediastinal and bilateral hilar adenopathyc/wmetastatic adenopathy or involvement with the patient's B-cell lymphoma. There was no evidence of metastatic disease or lymphoma involving the abdomen or pelvis. There was significantly improved ground-glass opacities in the right lung, minimal left pleural fluid(improved), andastable large hiatal hernia.There was stable enlarged thyroid gland containing heterogeneous nodules with substernal extension on the left.There was stable to slightly decreased size of the previously demonstrated right adrenal nodule and no significant change in a small, poorly visualized left adrenal nodule. 06/17/2019:  PET scanrevealed a known left parahilar lung mass markedly hypermetabolic with associated hypermetabolic lymphadenopathy in both hilar regions, the mediastinum, andthe right thoracic inlet. There was hypermetabolism in the right submandibular gland, indeterminate. There was no hypermetabolic metastatic disease evident in the abdomen or pelvis. There was a stable bilateral adrenal adenomas since 01/29/2018. There were tiny left pleural effusion and a large  hiatal hernia.There was a segment of transverse colon extendingup into the hiatal hernia without obstructive features.  12/02/2019:  Chest, abdomen and pelvis CT revealed interval resolution of mediastinal and hilar adenopathy c/w response to therapy. 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 There was increased moderate-sized  dependent left pleural effusion with increased left lower lobe compressive atelectasis. There was a new 6 mm nodule in the superior segment of the right lower lobe, nonspecific, but potentially a metastasis. Attention on follow-up was recommended.  There was no evidence of metastatic disease in the abdomen or pelvis.  There was a moderate-sized hiatal hernia and bilateral adrenal adenomas.    Infusion on 01/01/2020  Component Date Value Ref Range Status  . Magnesium 01/01/2020 1.5* 1.7 - 2.4 mg/dL Final   Performed at [facility], [address], [place], [place] [address]  . Sodium 01/01/2020 135  135 - 145 mmol/L Final  . Potassium 01/01/2020 3.6  3.5 - 5.1 mmol/L Final  . Chloride 01/01/2020 102  98 - 111 mmol/L Final  . CO2 01/01/2020 24  22 - 32 mmol/L Final  . Glucose, Bld 01/01/2020 121* 70 - 99 mg/dL Final   Glucose reference range applies only to samples taken after fasting for at least 8 hours.  . BUN 01/01/2020 13  8 - 23 mg/dL Final  . Creatinine, Ser 01/01/2020 0.85  0.44 - 1.00 mg/dL Final  . Calcium 01/01/2020 9.6  8.9 - 10.3 mg/dL Final  . Total Protein 01/01/2020 6.4* 6.5 - 8.1 g/dL Final  . Albumin 01/01/2020 2.4* 3.5 - 5.0 g/dL Final  . AST 01/01/2020 18  15 - 41 U/L Final  . ALT 01/01/2020 12  0 - 44 U/L Final  . Alkaline Phosphatase 01/01/2020 70  38 - 126 U/L Final  . Total Bilirubin 01/01/2020 0.7  0.3 - 1.2 mg/dL Final  . GFR calc non Af Amer 01/01/2020 >60  >60 mL/min Final  . GFR calc Af Amer 01/01/2020 >60  >60 mL/min  Final  . Anion gap 01/01/2020 9  5 - 15 Final   Performed at [facility], [address], [place], [place] [address]  . WBC 01/01/2020 10.2  4.0 - 10.5 K/uL Final  . RBC 01/01/2020 3.34* 3.87 - 5.11 MIL/uL Final  . Hemoglobin 01/01/2020 10.5* 12.0 - 15.0 g/dL Final  . HCT 01/01/2020 33.1* 36.0 - 46.0 % Final  . MCV 01/01/2020 99.1  80.0 - 100.0 fL Final  . MCH 01/01/2020 31.4  26.0 - 34.0 pg Final  . MCHC 01/01/2020 31.7  30.0 - 36.0 g/dL Final  . RDW 01/01/2020 18.0* 11.5 - 15.5 % Final  . Platelets 01/01/2020 400  150 - 400 K/uL Final  . nRBC 01/01/2020 0.0  0.0 - 0.2 % Final  . Neutrophils Relative % 01/01/2020 76  % Final  . Neutro Abs 01/01/2020 7.8* 1.7 - 7.7 K/uL Final  . Lymphocytes Relative 01/01/2020 10  % Final  . Lymphs Abs 01/01/2020 1.0  0.7 - 4.0 K/uL Final  . Monocytes Relative 01/01/2020 11  % Final  . Monocytes Absolute 01/01/2020 1.1* 0.1 - 1.0 K/uL Final  . Eosinophils Relative 01/01/2020 1  % Final  . Eosinophils Absolute 01/01/2020 0.1  0.0 - 0.5 K/uL Final  . Basophils Relative 01/01/2020 0  % Final  . Basophils Absolute 01/01/2020 0.0  0.0 - 0.1 K/uL Final  . Immature Granulocytes 01/01/2020 2  % Final  . Abs Immature Granulocytes 01/01/2020 0.16* 0.00 - 0.07 K/uL Final   Performed at [facility], [address], [place], [place] [address]    Assessment:  Toni Griffith is a 79 y.o. female withstage IVB B-cell lymphoma, unclassifiable, with features intermediate between diffuse large B-cell lymphoma and classical Hodgkin's lymphoma ("  gray zone" lymphoma). She underwent right iliac wing bone/bone marrow biopsy on 03/08/2016.  PET scanon 03/03/2016 revealed a hypermetabolic left lower lobe mass, bilateral pleural and pulmonary parenchymal nodular metastasis, a large pleural lesion invading the anterior left chest wall. There was activity in the superior left ocular orbit. There was a large lesion centered in the right iliac  wing with soft tissue extension. There were hypermetabolic right external iliac lymph nodes  [place] 04/04/2016 revealed an EF of 55-60%. Hepatitis B and C testing was negative on 03/30/2016. G6PD assay was normal. She declined LP with MTX prophylaxis.  She received 6 cycles of mini-RCHOP(04/07/2016 - 07/21/2016). She has had some transient fingertip numbness.  She received radiationto the right iliac wing, from 10/11/2016 - 11/01/2016. She received 3000 cGy over 3 weeks.  She has squamous cell lung cancer. CT guided left lower lobe nodule biopsyon 02/19/2017 confirmed squamous cell carcinoma. She underwent SBRT08/27/2018 - 04/09/2017. She received 5000 cGy in 5 fractions to the left lower lobe lesion. She underwent SBRTto a RUL lesion from 05/23/2017 - 06/18/2017.  CT guided left upper lobe nodule biopsyon 02/20/2018 revealedsquamous cell lung cancer. She received SBRTfrom 04/09/2018 - 04/15/2018. She received 6000 cGy in 5 fractions.   Foundation Oneon the lung biopsy on 03/15/2018 revealed MS-stable, tumor mutational burden 5 Muts/Mb, AKT2 amplification, CCNE1 amplification, CREBBP R1446L, KDR amplification, KIT amplification, PDGFRA amplification, PTEN loss exons 2-4, and PJ82 splice site [id number]. Marland Kitchen There were no alterations in EGFR, KRAS, RET, ALK, MET, ERBB2, BRAF, ROS1. PDL-1IHC analysis revealed a TPS score of 50%.  Bronchoscopyon 05/30/2018 revealed no endobronchial lesions. The 2 cm pre-carinal lymph node was biopsied. Pathology revealed metastatic non-small cell carcinoma, favor squamous cell carcinoma.  CT guided right paraspinal lesion biopsyon 10/16/2017 was compatible with involvement by the patient's previously diagnosed lymphoma. The case was referred to [facility] hematopathology. The neoplastic cells were largely CD30 negative. Repeat CD30 immunohistochemistry faintly stained >10% of the large atypical cells. CD20 stained the scattered large  atypical cells and few small cells.   She received 3 cycles of brentuximab vedotin(11/23/2017 - 01/11/2018).Her lymphoma progressed.  Shereceived14cycles of pembrolizumab(06/14/2018 -05/21/2019).   She received 4 cycles ofpembolizumab, carboplatin and Abraxane (08/29/2019 - 11/05/2019). She experienced tinnitus after her first day of chemotherapy. Tinnitus was minor with cycle #2.  Shereceived4000cGyto the left hilar regionfrom12/14/2020 -08/15/2019.  Chest, abdomen and pelvis CT on 12/02/2019 revealed interval resolution of mediastinal and hilar adenopathy c/w response to therapy. 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 There was increased moderate-sized dependent left pleural effusion with increased left lower lobe compressive atelectasis. There was a new 6 mm nodule in the superior segment of the right lower lobe, nonspecific, but potentially a metastasis. Attention on follow-up was recommended.  There was no evidence of metastatic disease in the abdomen or pelvis.  There was a moderate-sized hiatal hernia and bilateral adrenal adenomas.   Ultrasound guided left thoracentesis on 12/05/2019 revealed non-small cell carcinoma.   She hasiron deficiency anemia. Ferritinhas been followed: 14 on 05/06/2018,23 on 07/26/2018, and 59 on 11/01/2018. She denies any bleeding.  She has a grade I neuropathy. B12 and folate were normal on 12/06/2017. She has severe bilateral knee osteoarthritis. She has received steroid injections and Monovisc (hyaluronate derivatives) without improvement.   She was admitted to [facility] from 12/09/2019-12/11/2019 with chills, diaphoresis, tachycardia, and tachypnea for r/o sepsis. She was treated with broad spectrum antibiotics (vancomycin and Cefepime). Cultures remained negative.  She was discharged on Augmentin.  She was  switched to Levaquin on 12/25/2019.  She is deafand requires an interpreter.  Symptomatically, she has chronic shortness of breath.  She denies any fever or change in cough.  She requires some assistance with her ADLs.  Plan: 1.   Labs today:  CBC with diff, CMP, Mg, TSH. 2.Gray zone lymphoma Clinically, her lymphoma appears controlled. Exam reveals no adenopathy or hepatosplenomegaly. She has received 6 cycles of mini-RCOP, 3 cycles ofbrentuximab vedotin, and 16cycles of pembrolizumab. PET scan on 06/17/2019 revealed progressive disease in the chest felt secondary toprogressive lung cancer. She is s/p5additional cycle of maintenance pembrolizumab (last 11/05/2019). Review plan to restart pembrolizumab for ongoing maintenance treatment for recurrent gray zone lymphoma.  Patient and family agree to reinitiation of therapy.  Discussed with pharmacy no issues regarding continuation of therapy and gemcitabine. Maintenance cycle #6 pembrolizumab today. Discuss symptom management.  She has antiemetics at home to use on a prn bases.  Interventions are adequate.   .  3. 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 Patient is s/p SBRT to left upper lobe and left lower lobe nodules. Patient is s/p 15cyclesof pembrolizumab. Patient is s/p palliative radiation to the right hilar region (completed 08/15/2019). She is s/p4cycles of carboplatin and Abraxane (08/29/2019 - 11/05/2019). Chest, abdomen and pelvis CT scan on 12/02/2019 revealed a new moderate left-sided effusion.  Thoracentesis on 12/05/2019 confirmed non-small cell lung cancer.  No clinical trial available at [facility] with recommendation for single agent chemotherapy.  Continue to await follow-up from [facility] regarding treatment options.  Review plan for single agent chemotherapy (gemcitabine) rather than Taxotere given her  performance status and recent Abraxane.  Review plan for gemcitabine 1000 mg/meter squared weekly x2 every 3 weeks.   Potential side effects we reviewed.   Treatment is palliative.   Patient consents to treatment.  Labs reviewed.  Day 1 of cycle #1 gemcitabine. 4.Recurrent left sided pleural effusion  Patient is s/p thoracentesis on 12/05/2019 which confirmed non-small cell lung cancer.  Patient was seen by pulmonary medicine at [facility] on 12/23/2019 for consideration of pleurodesis vs Pleur-X catheter.   No intervention was performed as fluid had not reaccumulated.  Patient seen by Dr Patsey Berthold on 12/31/2019.  Plan for Pleur-X catheter if fluid reaccumulates.  Per conversation with Dr Patsey Berthold, stent would not be benificial as obstruction is too distal.  Continue to follow closely with Dr Patsey Berthold. 5.   Post obstructive pneumonia  Clinically, patient remains stable.  She has been treated with broad spectrum antibiotics at [facility] followed by Augmentin.  She is currently completing a course of Levaquin.  Continue to monitor. 6.   Chemotherapy-induced neuropathy Neuropathy remains stable. 7. Cancer related pain  Pain denies any significant pain.  She takes Tylenol for bilateral knee pain.   8.   Hypomagnesemia Magnesium 1.5.  Patient prefers IV magnesium rather than oral magnesium.  Magnesium 2 gm IV today. 9.   Code status  Code status is FULL. 10.   Consult palliative care. 11.   RTC in 1 week for MD assessment, labs (CBC with diff, CMP, Mg), and day 8 of cycle #1 gemcitabine.  I discussed the assessment and treatment plan with the patient.  The patient was provided an opportunity to ask questions and all were answered.  The patient agreed with the plan and demonstrated an understanding of the instructions.  The patient was advised to call  back if the symptoms worsen or if the condition fails to improve as anticipated.  I provided 25 minutes of face-to-face time during  this this encounter and > 50% was spent counseling as documented under my assessment and plan.     Lequita Asal, MD, PhD    01/01/2020, 10:16 AM  I, Selena Batten, am acting as scribe for [employer]. Mike Gip, MD, PhD.  I, Kindle Strohmeier C. Mike Gip, MD, have reviewed the above documentation for accuracy and completeness, and I agree with the above.

## 2019-12-30 NOTE — Telephone Encounter (Signed)
Toni Griffith called asking if patient appointment with Dr Mike Gip tomorrow morning should be rescheduled due to her not seeing the Pulmonologist until tomorrow afternoon and that Dr C needs their opinion to make her decision. Please advise and if needs to be reschedule, call daughter with new date and time

## 2019-12-31 ENCOUNTER — Other Ambulatory Visit: Payer: Self-pay

## 2019-12-31 ENCOUNTER — Encounter: Payer: Self-pay | Admitting: Pulmonary Disease

## 2019-12-31 ENCOUNTER — Inpatient Hospital Stay: Payer: Medicare HMO

## 2019-12-31 ENCOUNTER — Ambulatory Visit: Payer: Medicare HMO | Admitting: Radiation Oncology

## 2019-12-31 ENCOUNTER — Ambulatory Visit: Payer: Medicare HMO

## 2019-12-31 ENCOUNTER — Ambulatory Visit: Payer: Medicare HMO | Admitting: Pulmonary Disease

## 2019-12-31 ENCOUNTER — Ambulatory Visit: Payer: Medicare HMO | Admitting: Hematology and Oncology

## 2019-12-31 VITALS — BP 110/86 | HR 116 | Temp 97.7°F | Ht 63.0 in | Wt 164.0 lb

## 2019-12-31 DIAGNOSIS — J90 Pleural effusion, not elsewhere classified: Secondary | ICD-10-CM

## 2019-12-31 DIAGNOSIS — C3492 Malignant neoplasm of unspecified part of left bronchus or lung: Secondary | ICD-10-CM

## 2019-12-31 DIAGNOSIS — J449 Chronic obstructive pulmonary disease, unspecified: Secondary | ICD-10-CM

## 2019-12-31 DIAGNOSIS — R0602 Shortness of breath: Secondary | ICD-10-CM

## 2019-12-31 MED ORDER — IPRATROPIUM-ALBUTEROL 0.5-2.5 (3) MG/3ML IN SOLN: 3.0000 mL | RESPIRATORY_TRACT | 3 refills | Status: AC | PRN

## 2019-12-31 MED ORDER — PANTOPRAZOLE SODIUM 40 MG PO TBEC
40.0000 mg | DELAYED_RELEASE_TABLET | Freq: Every day | ORAL | 1 refills | Status: DC
Start: 2019-12-31 — End: 2019-12-31

## 2019-12-31 MED ORDER — PANTOPRAZOLE SODIUM 40 MG PO TBEC: 40.0000 mg | DELAYED_RELEASE_TABLET | Freq: Every day | ORAL | 2 refills | Status: AC

## 2019-12-31 NOTE — Patient Instructions (Signed)
We will start nebulizer patient's: 4 times a day as needed for shortness of breath and wheezing  We will restart your Protonix for stomach reflux  We are going to obtain an oxygen level at nighttime to make sure you do not need oxygen

## 2019-12-31 NOTE — Progress Notes (Signed)
 Assessment & Plan:  1. Chronic obstructive pulmonary disease, unspecified COPD type (HCC) (Primary) - Ambulatory Referral for DME  2. Squamous cell carcinoma of left lung (HCC)  3. Pleural effusion, left  4. Shortness of breath   Patient Instructions  We will start nebulizer patient's: 4 times a day as needed for shortness of breath and wheezing  We will restart your Protonix  for stomach reflux  We are going to obtain an oxygen level at nighttime to make sure you do not need oxygen  Please note: late entry documentation due to logistical difficulties during COVID-19 pandemic. This note is filed for information purposes only, and is not intended to be used for billing, nor does it represent the full scope/nature of the visit in question. Please see any associated scanned media linked to date of encounter for additional pertinent information.  Subjective:    HPI: Toni Griffith is a 79 y.o. female presenting to the pulmonology clinic on 12/31/2019 with report of: Follow-up (Dr. Aloha referred patient back over here. Daughter states that cancer has now gone to her lungs and outside of her lungs and wanted her to be seen. CXR last week 5/27 patient is having trouble breathing and cancer is spreading. )     Outpatient Encounter Medications as of 12/31/2019  Medication Sig Note   acetaminophen  (TYLENOL ) 500 MG tablet Take 500 mg by mouth every 6 (six) hours as needed for moderate pain.  (Patient not taking: Reported on 02/04/2020)    allopurinol  (ZYLOPRIM ) 300 MG tablet TAKE 1 TABLET BY MOUTH EVERY DAY (Patient taking differently: Take 300 mg by mouth daily. )    aspirin  EC 81 MG tablet Take 81 mg by mouth daily.    atorvastatin  (LIPITOR ) 80 MG tablet Take 80 mg by mouth every evening.     b complex vitamins tablet Take 1 tablet by mouth daily. (Patient not taking: Reported on 02/04/2020)    Carbonyl Iron (IRON CHEWS PEDIATRIC PO) Take 5 mg by mouth daily. (Patient not taking:  Reported on 02/04/2020)    Cholecalciferol  (VITAMIN D3) 2000 units capsule Take 1,000 Units by mouth daily.  (Patient not taking: Reported on 02/04/2020)    dextromethorphan  15 MG/5ML syrup Take 10 mLs by mouth 4 (four) times daily as needed for cough. (Patient not taking: Reported on 02/04/2020)    diclofenac  Sodium (VOLTAREN ) 1 % GEL Apply topically 4 (four) times daily.    DULoxetine  (CYMBALTA ) 60 MG capsule Take 60 mg by mouth daily.     lidocaine -prilocaine  (EMLA ) cream Apply cream 1 hour before chemotherapy treatment and place small peive of saran wrap over cream to protect clothing    Omega-3 Fatty Acids (FISH OIL PO) Take 1 tablet by mouth daily. (Patient not taking: Reported on 02/04/2020)    ondansetron  (ZOFRAN  ODT) 4 MG disintegrating tablet Take 1 tablet (4 mg total) by mouth every 8 (eight) hours as needed for nausea or vomiting.    vitamin E  400 UNIT capsule Take 400 Units by mouth daily.  (Patient not taking: Reported on 02/04/2020)    [DISCONTINUED] levofloxacin  (LEVAQUIN ) 500 MG tablet Take 1 tablet (500 mg total) by mouth daily.    ipratropium-albuterol  (DUONEB) 0.5-2.5 (3) MG/3ML SOLN Take 3 mLs by nebulization every 4 (four) hours as needed. 01/03/2020: Started on Thursday    pantoprazole  (PROTONIX ) 40 MG tablet Take 1 tablet (40 mg total) by mouth daily.    [DISCONTINUED] HYDROcodone -acetaminophen  (NORCO) 5-325 MG tablet Take 1/2 - 1 tablet by mouth  every 6 hours as needed for severe pain.    [DISCONTINUED] magnesium  oxide (MAG-OX) 400 (241.3 Mg) MG tablet Take 1 tablet (400 mg total) by mouth every other day.    [DISCONTINUED] pantoprazole  (PROTONIX ) 40 MG tablet Take 40 mg by mouth daily.     [DISCONTINUED] pantoprazole  (PROTONIX ) 40 MG tablet Take 1 tablet (40 mg total) by mouth daily.    [DISCONTINUED] potassium chloride  SA (KLOR-CON ) 20 MEQ tablet Take 1 tablet (20 mEq total) by mouth daily. Take as directed by MD.    Facility-Administered Encounter Medications as of 12/31/2019   Medication   heparin  lock flush 100 unit/mL   sodium chloride  flush (NS) 0.9 % injection 10 mL   sodium chloride  flush (NS) 0.9 % injection 10 mL      Objective:   Vitals:   12/31/19 1532  BP: 110/86  Pulse: (!) 116  Temp: 97.7 F (36.5 C)  Height: 5' 3 (1.6 m)  Weight: 164 lb (74.4 kg)  SpO2: 98%  TempSrc: Temporal  BMI (Calculated): 29.06     Physical exam documentation is limited by delayed entry of information.

## 2020-01-01 ENCOUNTER — Other Ambulatory Visit: Payer: Self-pay | Admitting: Hematology and Oncology

## 2020-01-01 ENCOUNTER — Inpatient Hospital Stay (HOSPITAL_BASED_OUTPATIENT_CLINIC_OR_DEPARTMENT_OTHER): Payer: Medicare HMO | Admitting: Hematology and Oncology

## 2020-01-01 ENCOUNTER — Encounter: Payer: Self-pay | Admitting: Hematology and Oncology

## 2020-01-01 ENCOUNTER — Inpatient Hospital Stay: Payer: Medicare HMO

## 2020-01-01 ENCOUNTER — Other Ambulatory Visit: Payer: Self-pay | Admitting: Licensed Clinical Social Worker

## 2020-01-01 ENCOUNTER — Inpatient Hospital Stay: Payer: Medicare HMO | Attending: Hematology and Oncology

## 2020-01-01 VITALS — BP 117/77 | HR 108 | Temp 95.4°F | Wt 163.5 lb

## 2020-01-01 DIAGNOSIS — J90 Pleural effusion, not elsewhere classified: Secondary | ICD-10-CM

## 2020-01-01 DIAGNOSIS — C3490 Malignant neoplasm of unspecified part of unspecified bronchus or lung: Secondary | ICD-10-CM

## 2020-01-01 DIAGNOSIS — J189 Pneumonia, unspecified organism: Secondary | ICD-10-CM | POA: Diagnosis not present

## 2020-01-01 DIAGNOSIS — G893 Neoplasm related pain (acute) (chronic): Secondary | ICD-10-CM

## 2020-01-01 DIAGNOSIS — C3492 Malignant neoplasm of unspecified part of left bronchus or lung: Secondary | ICD-10-CM | POA: Diagnosis not present

## 2020-01-01 DIAGNOSIS — D3502 Benign neoplasm of left adrenal gland: Secondary | ICD-10-CM | POA: Insufficient documentation

## 2020-01-01 DIAGNOSIS — I110 Hypertensive heart disease with heart failure: Secondary | ICD-10-CM | POA: Insufficient documentation

## 2020-01-01 DIAGNOSIS — C8518 Unspecified B-cell lymphoma, lymph nodes of multiple sites: Secondary | ICD-10-CM | POA: Diagnosis not present

## 2020-01-01 DIAGNOSIS — R05 Cough: Secondary | ICD-10-CM | POA: Insufficient documentation

## 2020-01-01 DIAGNOSIS — Z806 Family history of leukemia: Secondary | ICD-10-CM | POA: Insufficient documentation

## 2020-01-01 DIAGNOSIS — D3501 Benign neoplasm of right adrenal gland: Secondary | ICD-10-CM | POA: Insufficient documentation

## 2020-01-01 DIAGNOSIS — C3412 Malignant neoplasm of upper lobe, left bronchus or lung: Secondary | ICD-10-CM | POA: Insufficient documentation

## 2020-01-01 DIAGNOSIS — A419 Sepsis, unspecified organism: Secondary | ICD-10-CM | POA: Diagnosis not present

## 2020-01-01 DIAGNOSIS — M2550 Pain in unspecified joint: Secondary | ICD-10-CM | POA: Insufficient documentation

## 2020-01-01 DIAGNOSIS — Z7901 Long term (current) use of anticoagulants: Secondary | ICD-10-CM | POA: Insufficient documentation

## 2020-01-01 DIAGNOSIS — Z87891 Personal history of nicotine dependence: Secondary | ICD-10-CM | POA: Insufficient documentation

## 2020-01-01 DIAGNOSIS — E860 Dehydration: Secondary | ICD-10-CM | POA: Insufficient documentation

## 2020-01-01 DIAGNOSIS — Z5111 Encounter for antineoplastic chemotherapy: Secondary | ICD-10-CM | POA: Insufficient documentation

## 2020-01-01 DIAGNOSIS — Z7289 Other problems related to lifestyle: Secondary | ICD-10-CM | POA: Insufficient documentation

## 2020-01-01 DIAGNOSIS — I4891 Unspecified atrial fibrillation: Secondary | ICD-10-CM | POA: Insufficient documentation

## 2020-01-01 DIAGNOSIS — K449 Diaphragmatic hernia without obstruction or gangrene: Secondary | ICD-10-CM | POA: Insufficient documentation

## 2020-01-01 DIAGNOSIS — J440 Chronic obstructive pulmonary disease with acute lower respiratory infection: Secondary | ICD-10-CM | POA: Insufficient documentation

## 2020-01-01 DIAGNOSIS — Z801 Family history of malignant neoplasm of trachea, bronchus and lung: Secondary | ICD-10-CM | POA: Insufficient documentation

## 2020-01-01 DIAGNOSIS — Z833 Family history of diabetes mellitus: Secondary | ICD-10-CM | POA: Insufficient documentation

## 2020-01-01 DIAGNOSIS — C7951 Secondary malignant neoplasm of bone: Secondary | ICD-10-CM

## 2020-01-01 DIAGNOSIS — C8330 Diffuse large B-cell lymphoma, unspecified site: Secondary | ICD-10-CM | POA: Insufficient documentation

## 2020-01-01 DIAGNOSIS — G620 Drug-induced polyneuropathy: Secondary | ICD-10-CM | POA: Insufficient documentation

## 2020-01-01 DIAGNOSIS — Z79899 Other long term (current) drug therapy: Secondary | ICD-10-CM | POA: Insufficient documentation

## 2020-01-01 DIAGNOSIS — D509 Iron deficiency anemia, unspecified: Secondary | ICD-10-CM | POA: Insufficient documentation

## 2020-01-01 DIAGNOSIS — M170 Bilateral primary osteoarthritis of knee: Secondary | ICD-10-CM | POA: Insufficient documentation

## 2020-01-01 DIAGNOSIS — J9610 Chronic respiratory failure, unspecified whether with hypoxia or hypercapnia: Secondary | ICD-10-CM | POA: Insufficient documentation

## 2020-01-01 DIAGNOSIS — C8590 Non-Hodgkin lymphoma, unspecified, unspecified site: Secondary | ICD-10-CM

## 2020-01-01 DIAGNOSIS — H9190 Unspecified hearing loss, unspecified ear: Secondary | ICD-10-CM | POA: Insufficient documentation

## 2020-01-01 DIAGNOSIS — Z5112 Encounter for antineoplastic immunotherapy: Secondary | ICD-10-CM

## 2020-01-01 DIAGNOSIS — I509 Heart failure, unspecified: Secondary | ICD-10-CM | POA: Insufficient documentation

## 2020-01-01 DIAGNOSIS — F329 Major depressive disorder, single episode, unspecified: Secondary | ICD-10-CM | POA: Insufficient documentation

## 2020-01-01 DIAGNOSIS — C3432 Malignant neoplasm of lower lobe, left bronchus or lung: Secondary | ICD-10-CM | POA: Insufficient documentation

## 2020-01-01 DIAGNOSIS — T451X5A Adverse effect of antineoplastic and immunosuppressive drugs, initial encounter: Secondary | ICD-10-CM | POA: Insufficient documentation

## 2020-01-01 DIAGNOSIS — Z820 Family history of epilepsy and other diseases of the nervous system: Secondary | ICD-10-CM | POA: Insufficient documentation

## 2020-01-01 DIAGNOSIS — Z7189 Other specified counseling: Secondary | ICD-10-CM

## 2020-01-01 LAB — CBC WITH DIFFERENTIAL/PLATELET
Abs Immature Granulocytes: 0.16 10*3/uL — ABNORMAL HIGH (ref 0.00–0.07)
Basophils Absolute: 0 10*3/uL (ref 0.0–0.1)
Basophils Relative: 0 %
Eosinophils Absolute: 0.1 10*3/uL (ref 0.0–0.5)
Eosinophils Relative: 1 %
HCT: 33.1 % — ABNORMAL LOW (ref 36.0–46.0)
Hemoglobin: 10.5 g/dL — ABNORMAL LOW (ref 12.0–15.0)
Immature Granulocytes: 2 %
Lymphocytes Relative: 10 %
Lymphs Abs: 1 10*3/uL (ref 0.7–4.0)
MCH: 31.4 pg (ref 26.0–34.0)
MCHC: 31.7 g/dL (ref 30.0–36.0)
MCV: 99.1 fL (ref 80.0–100.0)
Monocytes Absolute: 1.1 10*3/uL — ABNORMAL HIGH (ref 0.1–1.0)
Monocytes Relative: 11 %
Neutro Abs: 7.8 10*3/uL — ABNORMAL HIGH (ref 1.7–7.7)
Neutrophils Relative %: 76 %
Platelets: 400 10*3/uL (ref 150–400)
RBC: 3.34 MIL/uL — ABNORMAL LOW (ref 3.87–5.11)
RDW: 18 % — ABNORMAL HIGH (ref 11.5–15.5)
WBC: 10.2 10*3/uL (ref 4.0–10.5)
nRBC: 0 % (ref 0.0–0.2)

## 2020-01-01 LAB — COMPREHENSIVE METABOLIC PANEL
ALT: 12 U/L (ref 0–44)
AST: 18 U/L (ref 15–41)
Albumin: 2.4 g/dL — ABNORMAL LOW (ref 3.5–5.0)
Alkaline Phosphatase: 70 U/L (ref 38–126)
Anion gap: 9 (ref 5–15)
BUN: 13 mg/dL (ref 8–23)
CO2: 24 mmol/L (ref 22–32)
Calcium: 9.6 mg/dL (ref 8.9–10.3)
Chloride: 102 mmol/L (ref 98–111)
Creatinine, Ser: 0.85 mg/dL (ref 0.44–1.00)
GFR calc Af Amer: >60 mL/min (ref >60)
GFR calc non Af Amer: >60 mL/min (ref >60)
Glucose, Bld: 121 mg/dL — ABNORMAL HIGH (ref 70–99)
Potassium: 3.6 mmol/L (ref 3.5–5.1)
Sodium: 135 mmol/L (ref 135–145)
Total Bilirubin: 0.7 mg/dL (ref 0.3–1.2)
Total Protein: 6.4 g/dL — ABNORMAL LOW (ref 6.5–8.1)

## 2020-01-01 LAB — MAGNESIUM: Magnesium: 1.5 mg/dL — ABNORMAL LOW (ref 1.7–2.4)

## 2020-01-01 LAB — TSH: TSH: 1.147 u[IU]/mL (ref 0.350–4.500)

## 2020-01-01 MED ORDER — HEPARIN SOD (PORK) LOCK FLUSH 100 UNIT/ML IV SOLN
500.0000 [IU] | Freq: Once | INTRAVENOUS | Status: AC | PRN
  Administered 2020-01-01: 500 [IU]
  Filled 2020-01-01: qty 5

## 2020-01-01 MED ORDER — SODIUM CHLORIDE 0.9 % IV SOLN
Freq: Once | INTRAVENOUS | Status: DC
  Filled 2020-01-01: qty 250

## 2020-01-01 MED ORDER — MAGNESIUM SULFATE 2 GM/50ML IV SOLN
2.0000 g | Freq: Once | INTRAVENOUS | Status: AC
  Administered 2020-01-01: 2 g via INTRAVENOUS
  Filled 2020-01-01: qty 50

## 2020-01-01 MED ORDER — ONDANSETRON HCL 4 MG PO TABS
8.0000 mg | ORAL_TABLET | Freq: Once | ORAL | Status: AC
  Administered 2020-01-01: 8 mg via ORAL
  Filled 2020-01-01: qty 2

## 2020-01-01 MED ORDER — HEPARIN SOD (PORK) LOCK FLUSH 100 UNIT/ML IV SOLN
500.0000 [IU] | Freq: Once | INTRAVENOUS | Status: DC
  Filled 2020-01-01: qty 5

## 2020-01-01 MED ORDER — ACETAMINOPHEN 325 MG PO TABS
ORAL_TABLET | ORAL | Status: AC
  Filled 2020-01-01: qty 2

## 2020-01-01 MED ORDER — ACETAMINOPHEN 325 MG PO TABS
650.0000 mg | ORAL_TABLET | Freq: Once | ORAL | Status: AC
  Administered 2020-01-01: 650 mg via ORAL

## 2020-01-01 MED ORDER — SODIUM CHLORIDE 0.9% FLUSH
10.0000 mL | INTRAVENOUS | Status: DC | PRN
  Administered 2020-01-01: 10 mL via INTRAVENOUS
  Filled 2020-01-01: qty 10

## 2020-01-01 MED ORDER — SODIUM CHLORIDE 0.9 % IV SOLN
200.0000 mg | Freq: Once | INTRAVENOUS | Status: AC
  Administered 2020-01-01: 200 mg via INTRAVENOUS
  Filled 2020-01-01: qty 8

## 2020-01-01 MED ORDER — SODIUM CHLORIDE 0.9 % IV SOLN
1800.0000 mg | Freq: Once | INTRAVENOUS | Status: AC
  Administered 2020-01-01: 1800 mg via INTRAVENOUS
  Filled 2020-01-01: qty 47.34

## 2020-01-01 MED ORDER — HEPARIN SOD (PORK) LOCK FLUSH 100 UNIT/ML IV SOLN
INTRAVENOUS | Status: AC
  Filled 2020-01-01: qty 5

## 2020-01-01 MED ORDER — SODIUM CHLORIDE 0.9 % IV SOLN
Freq: Once | INTRAVENOUS | Status: AC
  Filled 2020-01-01: qty 250

## 2020-01-01 NOTE — Progress Notes (Signed)
HR 108, Per [name] okay to proceed with treatment. Pt to also receive 2g IV Magnesium.

## 2020-01-03 ENCOUNTER — Inpatient Hospital Stay
Admission: EM | Admit: 2020-01-03 | Discharge: 2020-01-09 | DRG: 871 | Disposition: A | Discharge status: Skilled Nursing Facility | Payer: Medicare HMO | Attending: Internal Medicine | Admitting: Internal Medicine

## 2020-01-03 ENCOUNTER — Encounter: Payer: Self-pay | Admitting: Emergency Medicine

## 2020-01-03 ENCOUNTER — Telehealth: Payer: Self-pay | Admitting: Oncology

## 2020-01-03 ENCOUNTER — Emergency Department: Payer: Medicare HMO

## 2020-01-03 ENCOUNTER — Other Ambulatory Visit: Payer: Self-pay

## 2020-01-03 DIAGNOSIS — I340 Nonrheumatic mitral (valve) insufficiency: Secondary | ICD-10-CM | POA: Diagnosis not present

## 2020-01-03 DIAGNOSIS — J189 Pneumonia, unspecified organism: Secondary | ICD-10-CM | POA: Diagnosis not present

## 2020-01-03 DIAGNOSIS — C3490 Malignant neoplasm of unspecified part of unspecified bronchus or lung: Secondary | ICD-10-CM

## 2020-01-03 DIAGNOSIS — J441 Chronic obstructive pulmonary disease with (acute) exacerbation: Secondary | ICD-10-CM | POA: Diagnosis present

## 2020-01-03 DIAGNOSIS — I4892 Unspecified atrial flutter: Secondary | ICD-10-CM | POA: Diagnosis present

## 2020-01-03 DIAGNOSIS — I459 Conduction disorder, unspecified: Secondary | ICD-10-CM | POA: Diagnosis present

## 2020-01-03 DIAGNOSIS — I5031 Acute diastolic (congestive) heart failure: Secondary | ICD-10-CM | POA: Diagnosis present

## 2020-01-03 DIAGNOSIS — A419 Sepsis, unspecified organism: Secondary | ICD-10-CM | POA: Diagnosis not present

## 2020-01-03 DIAGNOSIS — C8516 Unspecified B-cell lymphoma, intrapelvic lymph nodes: Secondary | ICD-10-CM | POA: Diagnosis present

## 2020-01-03 DIAGNOSIS — J440 Chronic obstructive pulmonary disease with acute lower respiratory infection: Secondary | ICD-10-CM | POA: Diagnosis present

## 2020-01-03 DIAGNOSIS — Z951 Presence of aortocoronary bypass graft: Secondary | ICD-10-CM

## 2020-01-03 DIAGNOSIS — H913 Deaf nonspeaking, not elsewhere classified: Secondary | ICD-10-CM | POA: Diagnosis present

## 2020-01-03 DIAGNOSIS — D630 Anemia in neoplastic disease: Secondary | ICD-10-CM | POA: Diagnosis present

## 2020-01-03 DIAGNOSIS — E871 Hypo-osmolality and hyponatremia: Secondary | ICD-10-CM | POA: Diagnosis present

## 2020-01-03 DIAGNOSIS — D849 Immunodeficiency, unspecified: Secondary | ICD-10-CM | POA: Diagnosis present

## 2020-01-03 DIAGNOSIS — K1230 Oral mucositis (ulcerative), unspecified: Secondary | ICD-10-CM

## 2020-01-03 DIAGNOSIS — G629 Polyneuropathy, unspecified: Secondary | ICD-10-CM | POA: Diagnosis present

## 2020-01-03 DIAGNOSIS — C8170 Other classical Hodgkin lymphoma, unspecified site: Secondary | ICD-10-CM | POA: Diagnosis not present

## 2020-01-03 DIAGNOSIS — C83398 Diffuse large b-cell lymphoma of other extranodal and solid organ sites: Secondary | ICD-10-CM

## 2020-01-03 DIAGNOSIS — C3432 Malignant neoplasm of lower lobe, left bronchus or lung: Secondary | ICD-10-CM | POA: Diagnosis not present

## 2020-01-03 DIAGNOSIS — M1990 Unspecified osteoarthritis, unspecified site: Secondary | ICD-10-CM | POA: Diagnosis present

## 2020-01-03 DIAGNOSIS — C3492 Malignant neoplasm of unspecified part of left bronchus or lung: Secondary | ICD-10-CM

## 2020-01-03 DIAGNOSIS — I509 Heart failure, unspecified: Secondary | ICD-10-CM | POA: Diagnosis not present

## 2020-01-03 DIAGNOSIS — K5900 Constipation, unspecified: Secondary | ICD-10-CM | POA: Diagnosis present

## 2020-01-03 DIAGNOSIS — R6520 Severe sepsis without septic shock: Secondary | ICD-10-CM

## 2020-01-03 DIAGNOSIS — J9621 Acute and chronic respiratory failure with hypoxia: Secondary | ICD-10-CM | POA: Diagnosis present

## 2020-01-03 DIAGNOSIS — K219 Gastro-esophageal reflux disease without esophagitis: Secondary | ICD-10-CM | POA: Diagnosis present

## 2020-01-03 DIAGNOSIS — Z9221 Personal history of antineoplastic chemotherapy: Secondary | ICD-10-CM

## 2020-01-03 DIAGNOSIS — Z806 Family history of leukemia: Secondary | ICD-10-CM

## 2020-01-03 DIAGNOSIS — Z801 Family history of malignant neoplasm of trachea, bronchus and lung: Secondary | ICD-10-CM

## 2020-01-03 DIAGNOSIS — Z923 Personal history of irradiation: Secondary | ICD-10-CM

## 2020-01-03 DIAGNOSIS — I739 Peripheral vascular disease, unspecified: Secondary | ICD-10-CM | POA: Diagnosis present

## 2020-01-03 DIAGNOSIS — C3411 Malignant neoplasm of upper lobe, right bronchus or lung: Secondary | ICD-10-CM | POA: Diagnosis not present

## 2020-01-03 DIAGNOSIS — K130 Diseases of lips: Secondary | ICD-10-CM | POA: Diagnosis not present

## 2020-01-03 DIAGNOSIS — I2510 Atherosclerotic heart disease of native coronary artery without angina pectoris: Secondary | ICD-10-CM | POA: Diagnosis present

## 2020-01-03 DIAGNOSIS — B370 Candidal stomatitis: Secondary | ICD-10-CM | POA: Diagnosis present

## 2020-01-03 DIAGNOSIS — Z79899 Other long term (current) drug therapy: Secondary | ICD-10-CM

## 2020-01-03 DIAGNOSIS — I480 Paroxysmal atrial fibrillation: Secondary | ICD-10-CM | POA: Diagnosis not present

## 2020-01-03 DIAGNOSIS — I4891 Unspecified atrial fibrillation: Secondary | ICD-10-CM | POA: Diagnosis present

## 2020-01-03 DIAGNOSIS — Z515 Encounter for palliative care: Secondary | ICD-10-CM

## 2020-01-03 DIAGNOSIS — J9601 Acute respiratory failure with hypoxia: Secondary | ICD-10-CM | POA: Diagnosis not present

## 2020-01-03 DIAGNOSIS — Z833 Family history of diabetes mellitus: Secondary | ICD-10-CM

## 2020-01-03 DIAGNOSIS — I110 Hypertensive heart disease with heart failure: Secondary | ICD-10-CM | POA: Diagnosis present

## 2020-01-03 DIAGNOSIS — R918 Other nonspecific abnormal finding of lung field: Secondary | ICD-10-CM

## 2020-01-03 DIAGNOSIS — J90 Pleural effusion, not elsewhere classified: Secondary | ICD-10-CM

## 2020-01-03 DIAGNOSIS — E440 Moderate protein-calorie malnutrition: Secondary | ICD-10-CM | POA: Diagnosis present

## 2020-01-03 DIAGNOSIS — E785 Hyperlipidemia, unspecified: Secondary | ICD-10-CM | POA: Diagnosis present

## 2020-01-03 DIAGNOSIS — I361 Nonrheumatic tricuspid (valve) insufficiency: Secondary | ICD-10-CM | POA: Diagnosis not present

## 2020-01-03 DIAGNOSIS — Z20822 Contact with and (suspected) exposure to covid-19: Secondary | ICD-10-CM | POA: Diagnosis present

## 2020-01-03 DIAGNOSIS — C8339 Diffuse large B-cell lymphoma, extranodal and solid organ sites: Secondary | ICD-10-CM | POA: Diagnosis not present

## 2020-01-03 DIAGNOSIS — A4189 Other specified sepsis: Secondary | ICD-10-CM | POA: Diagnosis not present

## 2020-01-03 DIAGNOSIS — Z87891 Personal history of nicotine dependence: Secondary | ICD-10-CM

## 2020-01-03 DIAGNOSIS — Z7982 Long term (current) use of aspirin: Secondary | ICD-10-CM

## 2020-01-03 DIAGNOSIS — Z6831 Body mass index (BMI) 31.0-31.9, adult: Secondary | ICD-10-CM

## 2020-01-03 LAB — CBC WITH DIFFERENTIAL/PLATELET
Abs Immature Granulocytes: 0.2 10*3/uL — ABNORMAL HIGH (ref 0.00–0.07)
Basophils Absolute: 0 10*3/uL (ref 0.0–0.1)
Basophils Relative: 0 %
Eosinophils Absolute: 0 10*3/uL (ref 0.0–0.5)
Eosinophils Relative: 0 %
HCT: 29.6 % — ABNORMAL LOW (ref 36.0–46.0)
Hemoglobin: 9.8 g/dL — ABNORMAL LOW (ref 12.0–15.0)
Immature Granulocytes: 1 %
Lymphocytes Relative: 2 %
Lymphs Abs: 0.4 10*3/uL — ABNORMAL LOW (ref 0.7–4.0)
MCH: 32 pg (ref 26.0–34.0)
MCHC: 33.1 g/dL (ref 30.0–36.0)
MCV: 96.7 fL (ref 80.0–100.0)
Monocytes Absolute: 0.1 10*3/uL (ref 0.1–1.0)
Monocytes Relative: 1 %
Neutro Abs: 20.1 10*3/uL — ABNORMAL HIGH (ref 1.7–7.7)
Neutrophils Relative %: 96 %
Platelets: 363 10*3/uL (ref 150–400)
RBC: 3.06 MIL/uL — ABNORMAL LOW (ref 3.87–5.11)
RDW: 18 % — ABNORMAL HIGH (ref 11.5–15.5)
WBC: 20.9 10*3/uL — ABNORMAL HIGH (ref 4.0–10.5)
nRBC: 0 % (ref 0.0–0.2)

## 2020-01-03 LAB — COMPREHENSIVE METABOLIC PANEL
ALT: 14 U/L (ref 0–44)
AST: 21 U/L (ref 15–41)
Albumin: 2.3 g/dL — ABNORMAL LOW (ref 3.5–5.0)
Alkaline Phosphatase: 77 U/L (ref 38–126)
Anion gap: 10 (ref 5–15)
BUN: 19 mg/dL (ref 8–23)
CO2: 24 mmol/L (ref 22–32)
Calcium: 9.4 mg/dL (ref 8.9–10.3)
Chloride: 98 mmol/L (ref 98–111)
Creatinine, Ser: 0.79 mg/dL (ref 0.44–1.00)
GFR calc Af Amer: >60 mL/min (ref >60)
GFR calc non Af Amer: >60 mL/min (ref >60)
Glucose, Bld: 117 mg/dL — ABNORMAL HIGH (ref 70–99)
Potassium: 4.5 mmol/L (ref 3.5–5.1)
Sodium: 132 mmol/L — ABNORMAL LOW (ref 135–145)
Total Bilirubin: 1.3 mg/dL — ABNORMAL HIGH (ref 0.3–1.2)
Total Protein: 6.3 g/dL — ABNORMAL LOW (ref 6.5–8.1)

## 2020-01-03 LAB — LACTIC ACID, PLASMA: Lactic Acid, Venous: 1.3 mmol/L (ref 0.5–1.9)

## 2020-01-03 LAB — TROPONIN I (HIGH SENSITIVITY): Troponin I (High Sensitivity): 14 ng/L (ref <18)

## 2020-01-03 LAB — BRAIN NATRIURETIC PEPTIDE: B Natriuretic Peptide: 477 pg/mL — ABNORMAL HIGH (ref 0.0–100.0)

## 2020-01-03 MED ORDER — HEPARIN (PORCINE) 25000 UT/250ML-% IV SOLN
900.0000 [IU]/h | INTRAVENOUS | Status: DC
  Administered 2020-01-04: 900 [IU]/h via INTRAVENOUS

## 2020-01-03 MED ORDER — SODIUM CHLORIDE 0.9 % IV SOLN
2.0000 g | Freq: Once | INTRAVENOUS | Status: AC
  Administered 2020-01-03: 2 g via INTRAVENOUS
  Filled 2020-01-03: qty 2

## 2020-01-03 MED ORDER — FUROSEMIDE 10 MG/ML IJ SOLN
20.0000 mg | Freq: Once | INTRAMUSCULAR | Status: AC
  Administered 2020-01-03: 20 mg via INTRAVENOUS
  Filled 2020-01-03: qty 4

## 2020-01-03 MED ORDER — DIPHENHYDRAMINE HCL 50 MG/ML IJ SOLN: 25.0000 mg | Freq: Four times a day (QID) | INTRAMUSCULAR | Status: DC | PRN

## 2020-01-03 MED ORDER — IOHEXOL 350 MG/ML SOLN
75.0000 mL | Freq: Once | INTRAVENOUS | Status: AC | PRN
  Administered 2020-01-03: 75 mL via INTRAVENOUS

## 2020-01-03 MED ORDER — FAMOTIDINE 20 MG PO TABS
20.0000 mg | ORAL_TABLET | Freq: Two times a day (BID) | ORAL | Status: DC
  Administered 2020-01-04 – 2020-01-09 (×12): 20 mg via ORAL
  Filled 2020-01-03 (×12): qty 1

## 2020-01-03 MED ORDER — METOPROLOL TARTRATE 5 MG/5ML IV SOLN
5.0000 mg | Freq: Four times a day (QID) | INTRAVENOUS | Status: DC
  Administered 2020-01-04 – 2020-01-05 (×6): 5 mg via INTRAVENOUS
  Filled 2020-01-03 (×6): qty 5

## 2020-01-03 MED ORDER — MAGIC MOUTHWASH
15.0000 mL | Freq: Three times a day (TID) | ORAL | Status: DC
  Administered 2020-01-04 – 2020-01-09 (×17): 15 mL via ORAL
  Filled 2020-01-03 (×8): qty 15
  Filled 2020-01-03: qty 20
  Filled 2020-01-03 (×5): qty 15
  Filled 2020-01-03: qty 20
  Filled 2020-01-03 (×4): qty 15

## 2020-01-03 MED ORDER — POLYETHYLENE GLYCOL 3350 17 G PO PACK
17.0000 g | PACK | Freq: Two times a day (BID) | ORAL | Status: DC
  Administered 2020-01-04 – 2020-01-09 (×7): 17 g via ORAL
  Filled 2020-01-03 (×11): qty 1

## 2020-01-03 MED ORDER — HEPARIN BOLUS VIA INFUSION
3600.0000 [IU] | Freq: Once | INTRAVENOUS | Status: AC
  Administered 2020-01-04: 3600 [IU] via INTRAVENOUS
  Filled 2020-01-03: qty 3600

## 2020-01-03 NOTE — ED Notes (Signed)
Assigned bed @ 2335, spoke with RN Eugene Garnet

## 2020-01-03 NOTE — ED Provider Notes (Signed)
[facility] Emergency Department Provider Note   ____________________________________________   First MD Initiated Contact with Patient 01/03/20 1723     (approximate)  I have reviewed the triage vital signs and the nursing notes.   HISTORY  Chief Complaint Oral Swelling   HPI Toni Griffith is a 79 y.o. female patient has lymphoma and lung cancer.  She is deaf.  Her daughter interprets for me as we cannot find the interpreter on a stick.  She had that is the patient had a new chemo several days ago and another medicine.  They both started on Thursday.  Patient is gotten more short of breath in the last few days.  She is not on oxygen at home.  Here she is on 2 L of oxygen her oxygen saturations varying between 84 and 96 on 2 L with good waveform at all times.  Patient says her lips are sore and burning.  Lower lip is slightly swollen there are some cracking at the corners of both sides of her mouth.  This is what she came in for.  She does say that her shortness of breath is somewhat worse today than its been for example last week.         Past Medical History:  Diagnosis Date  . Arthritis   . Cancer [facility])    lymphoma-right hip  . Deaf   . GERD (gastroesophageal reflux disease)   . Hypertension    NOT ON MED AT PRESENT  . Squamous cell lung cancer [facility])     Patient Active Problem List   Diagnosis Date Noted  . Pneumonia 01/03/2020  . Congestive heart failure ([place])   . Cancer-related pain 12/21/2019  . Chills 12/10/2019  . Tachycardia 12/10/2019  . Tachypnea 12/10/2019  . Pleural effusion, left 12/03/2019  . Hypomagnesemia 10/18/2019  . Tinnitus of both ears 09/05/2019  . B12 deficiency 08/29/2019  . Arthritis of both knees 04/11/2019  . Primary osteoarthritis of both knees 12/22/2018  . Mediastinal adenopathy   . Encounter for antineoplastic chemotherapy 05/16/2018  . Encounter for antineoplastic immunotherapy 05/16/2018  .  Hyponatremia 02/01/2018  . Chronic pain of left knee 12/23/2017  . Chemotherapy-induced peripheral neuropathy ([place]) 11/24/2017  . Pain of both hip joints 11/24/2017  . Encounter for monoclonal antibody treatment for malignancy 11/23/2017  . Goals of care, counseling/discussion 11/01/2017  . 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 Osteoarthritis of left knee 04/27/2017  . Squamous cell lung cancer ([place]) 09/05/2016  . Hypokalemia 04/29/2016  . Neuropathy associated with cancer ([place]) 04/13/2016  . Hypercalcemia 03/30/2016  . Anemia 03/30/2016  . Non-Hodgkin's lymphoma ([place]) 03/08/2016  . Hodgkin's lymphoma ([place]) 03/08/2016  . 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 Bone metastasis ([place]) 03/03/2016  . Stage 3 chronic kidney disease 03/01/2016  . Gait abnormality 02/24/2016  . Nontoxic single thyroid nodule 02/21/2016  . History of coronary artery bypass graft x 2 02/20/2016  . Fatigue 02/20/2016  . Hiatal hernia 02/04/2016  . Esophageal dysphagia 12/31/2015  . Restless leg syndrome 12/28/2015  . Obesity 12/28/2015  . Mixed hyperlipidemia 12/28/2015  . GERD without esophagitis 12/28/2015  . Essential hypertension 12/28/2015  . Simple chronic bronchitis ([place]) 10/14/2015  . 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 Congenital deafness 10/14/2015    Past Surgical History:  Procedure Laterality Date  . CARDIAC SURGERY  1993  .  CORONARY ARTERY BYPASS GRAFT     DOUBLE  . CYST REMOVAL HAND Right   . ENDOBRONCHIAL ULTRASOUND N/A 05/30/2018   Procedure: ENDOBRONCHIAL ULTRASOUND;  Surgeon: Tyler Pita, MD;  Location: [facility] ORS;  Service: Cardiopulmonary;  Laterality: N/A;  . PERIPHERAL VASCULAR CATHETERIZATION N/A 04/06/2016   Procedure: Glori Luis Cath Insertion;  Surgeon: Algernon Huxley, MD;  Location: [facility];  Service:  Cardiovascular;  Laterality: N/A;    Prior to Admission medications   Medication Sig Start Date End Date Taking? Authorizing Provider  allopurinol (ZYLOPRIM) 300 MG tablet TAKE 1 TABLET BY MOUTH EVERY DAY Patient taking differently: Take 300 mg by mouth daily.  10/17/19  Yes Lequita Asal, MD  aspirin EC 81 MG tablet Take 81 mg by mouth daily.   Yes [provider]  atorvastatin (LIPITOR) 80 MG tablet Take 80 mg by mouth every evening.  10/14/15 01/03/20 Yes [provider]  b complex vitamins tablet Take 1 tablet by mouth daily.   Yes [provider]  Carbonyl Iron (IRON CHEWS PEDIATRIC PO) Take 5 mg by mouth daily.   Yes [provider]  Cholecalciferol (VITAMIN D3) 2000 units capsule Take 1,000 Units by mouth daily.    Yes [provider]  diclofenac Sodium (VOLTAREN) 1 % GEL Apply topically 4 (four) times daily.   Yes [provider]  DULoxetine (CYMBALTA) 60 MG capsule Take 60 mg by mouth daily.  09/01/19  Yes [provider]  levofloxacin (LEVAQUIN) 500 MG tablet Take 1 tablet (500 mg total) by mouth daily. 12/25/19  Yes Corcoran, Drue Second, MD  Omega-3 Fatty Acids (FISH OIL PO) Take 1 tablet by mouth daily.   Yes [provider]  ondansetron (ZOFRAN ODT) 4 MG disintegrating tablet Take 1 tablet (4 mg total) by mouth every 8 (eight) hours as needed for nausea or vomiting. 06/11/19  Yes Corcoran, Drue Second, MD  pantoprazole (PROTONIX) 40 MG tablet Take 1 tablet (40 mg total) by mouth daily. 12/31/19 12/30/20 Yes Tyler Pita, MD  vitamin E 400 UNIT capsule Take 400 Units by mouth daily.    Yes [provider]  acetaminophen (TYLENOL) 500 MG tablet Take 500 mg by mouth every 6 (six) hours as needed for moderate pain.     [provider]  dextromethorphan 15 MG/5ML syrup Take 10 mLs by mouth 4 (four) times daily as needed for cough.    [provider]  HYDROcodone-acetaminophen (NORCO) 5-325 MG  tablet Take 1/2 - 1 tablet by mouth every 6 hours as needed for severe pain. 12/17/19   Lequita Asal, MD  ipratropium-albuterol (DUONEB) 0.5-2.5 (3) MG/3ML SOLN Take 3 mLs by nebulization every 4 (four) hours as needed. 12/31/19   Tyler Pita, MD  lidocaine-prilocaine (EMLA) cream Apply cream 1 hour before chemotherapy treatment and place small peive of saran wrap over cream to protect clothing 06/11/19   Lequita Asal, MD    Allergies Patient has no known allergies.  Family History  Problem Relation Age of Onset  . Lung cancer Mother   . Lung cancer Father   . Diabetes Maternal Uncle   . Myasthenia gravis Maternal Grandmother   . Leukemia Maternal Grandfather     Social History Social History   Tobacco Use  . Smoking status: Former Smoker    Packs/day: 2.00    Years: 40.00    Pack years: 80.00    Types: Cigarettes    Quit date: 11/21/2015  Years since quitting: 4.1  . Smokeless tobacco: Never Used  Substance Use Topics  . Alcohol use: Yes    Comment: rare  . Drug use: No    Review of Systems  Constitutional: No fever/chills Eyes: No visual changes. ENT: No sore throat. Cardiovascular: Denies chest pain. Respiratory:shortness of breath. Gastrointestinal: No abdominal pain.  No nausea, no vomiting.  No diarrhea.  No constipation. Genitourinary: Negative for dysuria. Musculoskeletal: Negative for back pain. Skin: Negative for rash. Neurological: Negative for headaches, focal weakness  ____________________________________________   PHYSICAL EXAM:  VITAL SIGNS: ED Triage Vitals  Enc Vitals Group     BP 01/03/20 1643 (!) 132/92     Pulse Rate 01/03/20 1643 70     Resp 01/03/20 1643 (!) 40     Temp 01/03/20 1643 98.5 F (36.9 C)     Temp Source 01/03/20 1643 Oral     SpO2 01/03/20 1643 91 %     Weight --      Height --      Head Circumference --      Peak Flow --      Pain Score 01/03/20 1659 8     Pain Loc --      Pain Edu? --       Excl. in [place]? --     Constitutional: Alert and oriented.  Ill-appearing breathing very fast Eyes: Conjunctivae are normal.  Head: Atraumatic. Nose: No congestion/rhinnorhea. Mouth/Throat: Mucous membranes are moist.  Oropharynx non-erythematous. Neck: No stridor.   Cardiovascular: Normal rate, regular rhythm. Grossly normal heart sounds.  Good peripheral circulation. Respiratory: Normal respiratory effort.  No retractions. Lungs right lung is clear left lung only has breath sounds at the apex Gastrointestinal: Soft and nontender. No distention. No abdominal bruits. No CVA tenderness. Musculoskeletal: No lower extremity tenderness nor edema.  Neurologic:  Normal speech and language. No gross focal neurologic deficits are appreciated. No gait instability. Skin:  Skin is warm, dry and intact. No rash noted. Psychiatric: Mood and affect are normal. Speech and behavior are normal.  ____________________________________________   LABS (all labs ordered are listed, but only abnormal results are displayed)  Labs Reviewed  COMPREHENSIVE METABOLIC PANEL - Abnormal; Notable for the following components:      Result Value   Sodium 132 (*)    Glucose, Bld 117 (*)    Total Protein 6.3 (*)    Albumin 2.3 (*)    Total Bilirubin 1.3 (*)    All other components within normal limits  BRAIN NATRIURETIC PEPTIDE - Abnormal; Notable for the following components:   B Natriuretic Peptide 477.0 (*)    All other components within normal limits  CBC WITH DIFFERENTIAL/PLATELET - Abnormal; Notable for the following components:   WBC 20.9 (*)    RBC 3.06 (*)    Hemoglobin 9.8 (*)    HCT 29.6 (*)    RDW 18.0 (*)    Neutro Abs 20.1 (*)    Lymphs Abs 0.4 (*)    Abs Immature Granulocytes 0.20 (*)    All other components within normal limits  URINE CULTURE  CULTURE, BLOOD (ROUTINE X 2)  CULTURE, BLOOD (ROUTINE X 2)  SARS CORONAVIRUS 2 BY RT PCR (HOSPITAL ORDER, [facility])    LACTIC ACID, PLASMA  LACTIC ACID, PLASMA  URINALYSIS, COMPLETE (UACMP) WITH MICROSCOPIC  APTT  PROTIME-INR  HEPARIN LEVEL (UNFRACTIONATED)  TROPONIN I (HIGH SENSITIVITY)  TROPONIN I (HIGH SENSITIVITY)   ____________________________________________  EKG EKG read interpreted  by me shows a flutter at a rate of 121.  Normal axis no obvious acute ST-T wave changes  _Monitor reading shows an irregularly irregular rhythm.  Rate fluctuates between high 100s and high that I saw the 127.  ___________________________________________  RADIOLOGY  ED MD interpretation: Rest x-ray reviewed by me read by radiology shows no marked worsening of the variation of the left lung and what appears to be fluid in the right lung.  Official radiology report(s): CT ANGIO CHEST PE W OR WO CONTRAST  Result Date: 01/03/2020 CLINICAL DATA:  Shortness of breath, oral swelling, history of squamous cell lung cancer and lymphoma EXAM: CT ANGIOGRAPHY CHEST WITH CONTRAST TECHNIQUE: Multidetector CT imaging of the chest was performed using the standard protocol during bolus administration of intravenous contrast. Multiplanar CT image reconstructions and MIPs were obtained to evaluate the vascular anatomy. CONTRAST:  57mL OMNIPAQUE IOHEXOL 350 MG/ML SOLN COMPARISON:  12/02/2019, 01/03/2020 FINDINGS: Cardiovascular: This is a technically adequate evaluation of the pulmonary vasculature. There are no filling defects or pulmonary emboli. Stable atherosclerosis of the thoracic aorta and coronary vessels. Mediastinum/Nodes: Stable appearance of the thyroid, esophagus, and trachea. No pathologic mediastinal adenopathy. Lungs/Pleura: There is progressive left perihilar consolidation, consistent with progression of disease. Multilocular left pleural effusion is again noted, not appreciably changed in size. There is a new trace right pleural effusion. Increasing right lower lobe pulmonary nodule now measures 13 mm reference image 32,  compatible with progression of disease. Upper Abdomen: Stable left lobe hepatic cyst. 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 Reconstructed images demonstrate no additional findings. Review of the MIP images confirms the above findings. IMPRESSION: 1. Progression of disease, with enlarging left hilar mass and right lower lobe pulmonary nodule. 2. Stable multilocular left pleural effusion. 3. Trace right pleural effusion. Electronically Signed   By: Randa Ngo M.D.   On: 01/03/2020 22:31   DG Chest Portable 1 View  Result Date: 01/03/2020 CLINICAL DATA:  Short of breath, oral swelling, history of squamous cell lung cancer and lymphoma EXAM: PORTABLE CHEST 1 VIEW COMPARISON:  12/25/2019 FINDINGS: Single frontal view of the chest demonstrates right chest wall port unchanged. Cardiac silhouette is stable. There is increasing consolidation and effusion within the left hemithorax. Stable nodularity within the right midlung zone. Small amount of fluid within the minor fissure. Increased vascular congestion since prior study.  No pneumothorax. IMPRESSION: 1. Worsening left lung consolidation and effusion. 2. Stable nodularity within the right midlung zone. 3. Increased vascular congestion since prior study. Electronically Signed   By: Randa Ngo M.D.   On: 01/03/2020 18:20    ____________________________________________   PROCEDURES  Procedure(s) performed (including Critical Care): Critical care time 45 minutes this includes speaking several times to the patient and her daughter and evaluating the patient's labs and x-ray.  I also talked to the oncology on-call doc and the hospitalist about her.  Procedures   ____________________________________________   INITIAL IMPRESSION / ASSESSMENT AND PLAN / ED COURSE  Patient came in complaining of burning lips but I noticed that she was breathing very fast and she was fairly  tachycardic.  Her lip is only mildly swollen there are some chili-itis at both corners of the mouth.  I am not sure what this is from.  But on much more worried about her rapid respiratory rate In the 40s.  Chest x-ray showed much worse opacification of the left lung.  D-dimer was elevated EKG showed A.  A flutter which appears to be new rate of about 120 although it will go down into the low 100s as well.  Patient did not have any chest pain.  She has an elevated white count as well.  I am not sure if she has CHF with a pneumonia and I am not sure how new the A. fib is and how much is contributing to her shortness of breath.  The right lung also says shows some increased markings which are most consistent with CHF.  It is not anywhere near his severe as the left lung though.  We will get her in the hospital and treat her with more antibiotics which I started in the ER probably some more Lasix which we also started the ER and work on rate control.  Additionally we can further evaluate the burning of her lips which could be due to a vitamin deficiency or any 1 of a number of other causes.              ____________________________________________   FINAL CLINICAL IMPRESSION(S) / ED DIAGNOSES  Final diagnoses:  Congestive heart failure, unspecified HF chronicity, unspecified heart failure type ([place])  Community acquired pneumonia of right lung, unspecified part of lung     ED Discharge Orders    None       Note:  This document was prepared using Dragon voice recognition software and may include unintentional dictation errors.    Nena Polio, MD 01/03/20 2351

## 2020-01-03 NOTE — Progress Notes (Signed)
ANTICOAGULATION CONSULT NOTE - Initial Consult  Pharmacy Consult for heparin Indication: atrial fibrillation  No Known Allergies  Patient Measurements:   Heparin Dosing Weight: 61 kg  Vital Signs: Temp: 98.5 F (36.9 C) (06/05 1643) Temp Source: Oral (06/05 1643) BP: 145/82 (06/05 2000) Pulse Rate: 44 (06/05 2000)  Labs: Recent Labs    01/01/20 0916 01/03/20 1833  HGB 10.5* 9.8*  HCT 33.1* 29.6*  PLT 400 363  CREATININE 0.85 0.79  TROPONINIHS  --  14    Estimated Creatinine Clearance: 57.7 mL/min (by C-G formula based on SCr of 0.79 mg/dL).   Medical History: Past Medical History:  Diagnosis Date  . Arthritis   . Cancer [facility])    lymphoma-right hip  . Deaf   . GERD (gastroesophageal reflux disease)   . Hypertension    NOT ON MED AT PRESENT  . Squamous cell lung cancer (HCC)     Medications:  Scheduled:  . famotidine  20 mg Oral BID  . heparin  3,600 Units Intravenous Once  . magic mouthwash  15 mL Oral TID  . [START ON 01/04/2020] metoprolol tartrate  5 mg Intravenous Q6H  . polyethylene glycol  17 g Oral BID    Assessment: Patient arrives x c/o burning/swollen lips w/ h/o squam cell lung CA s/p chemo tx, GERD, COPD w/ pending sleep study, deafness, arrives to ED in afib/aflutter w/ RVR on EKG, troponin WNL, CBC WNL for patient's clinical scenario, but also has elevated WBC w/ left shift and increased vascular congestion on CXR. Baseline aPTT/INR pending, no anticoagulation PTA. Patient is being started on heparin drip for management of new onset afib w/ a CHADS-VASc score of 4 (HTN, age > 47, female).   Goal of Therapy:  Heparin level 0.3-0.7 units/ml Monitor platelets by anticoagulation protocol: Yes   Plan:  Will bolus patient w/ heparin 3600 units IV x 1. Will start rate at 900 units/hr. Will check anti-Xa at 0700. Will monitor daily CBC's and adjust per anti-Xa levels and f/u on baseline coags.  Tobie Lords, PharmD, BCPS Clinical  Pharmacist 01/03/2020,11:15 PM

## 2020-01-03 NOTE — H&P (Signed)
TRH H&P    Patient Demographics:    Toni Griffith, is a 79 y.o. female  MRN: [id number]  DOB - 24-Nov-1940  Admit Date - 01/03/2020  Referring MD/NP/PA: Dr. Cinda Quest  Outpatient Primary MD for the patient is Francesca Oman, DO  Patient coming from: Home  Chief complaint- Swollen lips   HPI:    Toni Griffith  is a 79 y.o. female, with history of squamous cell lung cancer, hypertension, GERD, hearing impairment, and arthritis presents to the ER with daughter for burning, swollen lips and mouth.  Daughter is interpreting American sign language.  They both report that patient had started 2 medications 3 days ago.  The first was an Atrovent inhaler, and the second was a new chemo infusion.  Yesterday, patient started to notice swollen burning lips.  The symptoms got worse overnight, and she had burning in her throat this morning as well.  Her son prepared her hot tea which soothe the pain, but it returned.  Patient took no Benadryl.  She did attempt Vaseline and Chapstick to soothe her lips, with no relief.  Patient reports her lip pain was 9 out of 10 yesterday and is 10 out of 10 today.  Patient called her on-call oncologist who told her to go into the ER for evaluation.  Patient follows with oncology here in [place].  Patient reports increased shortness of breath as well.  She is not on oxygen at home, and is requiring 4 L nasal cannula to maintain her oxygen saturations here in the ED.  Patient denies chest pain or palpitations.  She denies cough.  She did just finish an antibiotic course of Levaquin for pneumonia.  Patient reports no history of CHF or A. fib.  Today during my exam she is in A. fib with RVR with a rate of 148.  Her EKG also shows A. fib RVR with a rate of 121.  Patient is not on any blood thinners.  Patient reports nausea and dysphagia that are subacute and not necessarily associated with this event.  Patient  reports constipation as well.  Her last normal bowel movement was 4 days ago.  She attributes her constipation to poor p.o. intake-which is not associated with this event.  As mentioned above, patient does not wear oxygen at home, but she had a sleep study and is awaiting the results.  Anticipate she will be wearing oxygen at night.  ED highlights Temp 97.1, blood pressure 87/65, pulse rate 145, respiratory rate 45, O2 sat 95% on 4 L nasal cannula White blood cell count increased from 10,000 to almost 21,002 days Mild hyponatremia with a sodium of 132 BNP 477 Thyroid 1.147 Trope 14 X-ray equals worsening left lung consolidation and effusion increased vascular congestion EKG shows A. fib with a heart rate of 121 No medications given for A. fib in ED Lasix 20 mg IV and cefepime started in ED\ Chest x-ray shows worsening edema versus infiltrate      Review of systems:    In addition to the HPI above,  No Fever-chills, No Headache, No changes with Vision or hearing, Admits to dysphagia No Chest pain, Cough positive for shortness of Breath, No Abdominal pain, positive for nausea and constipation  no Blood in stool or Urine, No dysuria, No new skin rashes or bruises, Positive for chronic arthritis No new weakness, tingling, numbness in any extremity, No recent weight gain or loss, No polyuria, polydypsia or polyphagia, No significant Mental Stressors.  All other systems reviewed and are negative.    Past History of the following :    Past Medical History:  Diagnosis Date   Arthritis    Cancer ([place])    lymphoma-right hip   Deaf    GERD (gastroesophageal reflux disease)    Hypertension    NOT ON MED AT PRESENT   Squamous cell lung cancer ([place])       Past Surgical History:  Procedure Laterality Date   CARDIAC SURGERY  1993   CORONARY ARTERY BYPASS GRAFT     DOUBLE   CYST REMOVAL HAND Right    ENDOBRONCHIAL ULTRASOUND N/A 05/30/2018   Procedure:  ENDOBRONCHIAL ULTRASOUND;  Surgeon: Tyler Pita, MD;  Location: [facility] ORS;  Service: Cardiopulmonary;  Laterality: N/A;   PERIPHERAL VASCULAR CATHETERIZATION N/A 04/06/2016   Procedure: Glori Luis Cath Insertion;  Surgeon: Algernon Huxley, MD;  Location: [facility];  Service: Cardiovascular;  Laterality: N/A;      Social History:      Social History   Tobacco Use   Smoking status: Former Smoker    Packs/day: 2.00    Years: 40.00    Pack years: 80.00    Types: Cigarettes    Quit date: 11/21/2015    Years since quitting: 4.1   Smokeless tobacco: Never Used  Substance Use Topics   Alcohol use: Yes    Comment: rare       Family History :     Family History  Problem Relation Age of Onset   Lung cancer Mother    Lung cancer Father    Diabetes Maternal Uncle    Myasthenia gravis Maternal Grandmother    Leukemia Maternal Grandfather       Home Medications:   Prior to Admission medications   Medication Sig Start Date End Date Taking? Authorizing Provider  allopurinol (ZYLOPRIM) 300 MG tablet TAKE 1 TABLET BY MOUTH EVERY DAY Patient taking differently: Take 300 mg by mouth daily.  10/17/19  Yes Lequita Asal, MD  aspirin EC 81 MG tablet Take 81 mg by mouth daily.   Yes [provider]  atorvastatin (LIPITOR) 80 MG tablet Take 80 mg by mouth every evening.  10/14/15 01/03/20 Yes [provider]  b complex vitamins tablet Take 1 tablet by mouth daily.   Yes [provider]  Carbonyl Iron (IRON CHEWS PEDIATRIC PO) Take 5 mg by mouth daily.   Yes [provider]  Cholecalciferol (VITAMIN D3) 2000 units capsule Take 1,000 Units by mouth daily.    Yes [provider]  diclofenac Sodium (VOLTAREN) 1 % GEL Apply topically 4 (four) times daily.   Yes [provider]  DULoxetine (CYMBALTA) 60 MG capsule Take 60 mg by mouth daily.  09/01/19  Yes [provider]  levofloxacin (LEVAQUIN) 500 MG tablet Take 1  tablet (500 mg total) by mouth daily. 12/25/19  Yes Corcoran, Drue Second, MD  Omega-3 Fatty Acids (FISH OIL PO) Take 1 tablet by mouth daily.   Yes [provider]  ondansetron (ZOFRAN ODT) 4 MG  disintegrating tablet Take 1 tablet (4 mg total) by mouth every 8 (eight) hours as needed for nausea or vomiting. 06/11/19  Yes Corcoran, Drue Second, MD  pantoprazole (PROTONIX) 40 MG tablet Take 1 tablet (40 mg total) by mouth daily. 12/31/19 12/30/20 Yes Tyler Pita, MD  vitamin E 400 UNIT capsule Take 400 Units by mouth daily.    Yes [provider]  acetaminophen (TYLENOL) 500 MG tablet Take 500 mg by mouth every 6 (six) hours as needed for moderate pain.     [provider]  dextromethorphan 15 MG/5ML syrup Take 10 mLs by mouth 4 (four) times daily as needed for cough.    [provider]  HYDROcodone-acetaminophen (NORCO) 5-325 MG tablet Take 1/2 - 1 tablet by mouth every 6 hours as needed for severe pain. 12/17/19   Lequita Asal, MD  ipratropium-albuterol (DUONEB) 0.5-2.5 (3) MG/3ML SOLN Take 3 mLs by nebulization every 4 (four) hours as needed. 12/31/19   Tyler Pita, MD  lidocaine-prilocaine (EMLA) cream Apply cream 1 hour before chemotherapy treatment and place small peive of saran wrap over cream to protect clothing 06/11/19   Lequita Asal, MD     Allergies:    No Known Allergies   Physical Exam:   Vitals  Blood pressure (!) 145/82, pulse (!) 44, temperature 98.5 F (36.9 C), temperature source Oral, resp. rate (!) 42, SpO2 (!) 79 %.  1.  General: Lying with head of bed up, visibly short of breath  2. Psychiatric: Mood and behavior normal for situation  3. Neurologic: No focal deficits, at baseline  4. HEENMT:  Head is atraumatic, normocephalic, pupils are reactive to light, neck is nontender, trachea is midline Lips are minimally swollen with cheilitis White plaque along inside of mandible  5. Respiratory : Inspiratory and  expiratory wheezing with rhonchi  6. Cardiovascular : Heart rate is tachycardic, and rhythm is irregular  7. Gastrointestinal:  Abdomen is soft, nondistended, nontender to palpation  8. Skin:  Skin appears pale without acute lesion on limited skin exam  9.Musculoskeletal:  2+ pitting edema    Data Review:    CBC Recent Labs  Lab 01/01/20 0916 01/03/20 1833  WBC 10.2 20.9*  HGB 10.5* 9.8*  HCT 33.1* 29.6*  PLT 400 363  MCV 99.1 96.7  MCH 31.4 32.0  MCHC 31.7 33.1  RDW 18.0* 18.0*  LYMPHSABS 1.0 0.4*  MONOABS 1.1* 0.1  EOSABS 0.1 0.0  BASOSABS 0.0 0.0   ------------------------------------------------------------------------------------------------------------------  Results for orders placed or performed during the hospital encounter of 01/03/20 (from the past 48 hour(s))  Comprehensive metabolic panel     Status: Abnormal   Collection Time: 01/03/20  6:33 PM  Result Value Ref Range   Sodium 132 (L) 135 - 145 mmol/L   Potassium 4.5 3.5 - 5.1 mmol/L   Chloride 98 98 - 111 mmol/L   CO2 24 22 - 32 mmol/L   Glucose, Bld 117 (H) 70 - 99 mg/dL    Comment: Glucose reference range applies only to samples taken after fasting for at least 8 hours.   BUN 19 8 - 23 mg/dL   Creatinine, Ser 0.79 0.44 - 1.00 mg/dL   Calcium 9.4 8.9 - 10.3 mg/dL   Total Protein 6.3 (L) 6.5 - 8.1 g/dL   Albumin 2.3 (L) 3.5 - 5.0 g/dL   AST 21 15 - 41 U/L   ALT 14 0 - 44 U/L   Alkaline Phosphatase 77 38 - 126 U/L  Total Bilirubin 1.3 (H) 0.3 - 1.2 mg/dL   GFR calc non Af Amer >60 >60 mL/min   GFR calc Af Amer >60 >60 mL/min   Anion gap 10 5 - 15    Comment: Performed at [facility], [address] [place], [place] [address]  Brain natriuretic peptide     Status: Abnormal   Collection Time: 01/03/20  6:33 PM  Result Value Ref Range   B Natriuretic Peptide 477.0 (H) 0.0 - 100.0 pg/mL    Comment: Performed at [facility], [place], [place] [address]    Troponin I (High Sensitivity)     Status: None   Collection Time: 01/03/20  6:33 PM  Result Value Ref Range   Troponin I (High Sensitivity) 14 <18 ng/L    Comment: (NOTE) Elevated high sensitivity troponin I (hsTnI) values and significant  changes across serial measurements may suggest ACS but many other  chronic and acute conditions are known to elevate hsTnI results.  Refer to the "Links" section for chest pain algorithms and additional  guidance. Performed at [facility], [address] [place], [place] [address]   Lactic acid, plasma     Status: None   Collection Time: 01/03/20  6:33 PM  Result Value Ref Range   Lactic Acid, Venous 1.3 0.5 - 1.9 mmol/L    Comment: Performed at [facility], [address] [place], [place] [address]  CBC with Differential     Status: Abnormal   Collection Time: 01/03/20  6:33 PM  Result Value Ref Range   WBC 20.9 (H) 4.0 - 10.5 K/uL   RBC 3.06 (L) 3.87 - 5.11 MIL/uL   Hemoglobin 9.8 (L) 12.0 - 15.0 g/dL   HCT 29.6 (L) 36.0 - 46.0 %   MCV 96.7 80.0 - 100.0 fL   MCH 32.0 26.0 - 34.0 pg   MCHC 33.1 30.0 - 36.0 g/dL   RDW 18.0 (H) 11.5 - 15.5 %   Platelets 363 150 - 400 K/uL   nRBC 0.0 0.0 - 0.2 %   Neutrophils Relative % 96 %   Neutro Abs 20.1 (H) 1.7 - 7.7 K/uL   Lymphocytes Relative 2 %   Lymphs Abs 0.4 (L) 0.7 - 4.0 K/uL   Monocytes Relative 1 %   Monocytes Absolute 0.1 0.1 - 1.0 K/uL   Eosinophils Relative 0 %   Eosinophils Absolute 0.0 0.0 - 0.5 K/uL   Basophils Relative 0 %   Basophils Absolute 0.0 0.0 - 0.1 K/uL   Immature Granulocytes 1 %   Abs Immature Granulocytes 0.20 (H) 0.00 - 0.07 K/uL    Comment: Performed at [facility], [address] [place],  [address]    Chemistries  Recent Labs  Lab 01/01/20 [phone] 01/03/20 1833  NA 135 132*  K 3.6 4.5  CL 102 98  CO2 24 24  GLUCOSE 121* 117*  BUN 13 19  CREATININE 0.85 0.79  CALCIUM 9.6 9.4  MG 1.5*  --   AST 18 21  ALT 12 14   ALKPHOS 70 77  BILITOT 0.7 1.3*   ------------------------------------------------------------------------------------------------------------------  ------------------------------------------------------------------------------------------------------------------ GFR: Estimated Creatinine Clearance: 57.7 mL/min (by C-G formula based on SCr of 0.79 mg/dL). Liver Function Tests: Recent Labs  Lab 01/01/20 0916 01/03/20 1833  AST 18 21  ALT 12 14  ALKPHOS 70 77  BILITOT 0.7 1.3*  PROT 6.4* 6.3*  ALBUMIN 2.4* 2.3*   No results for input(s): LIPASE, AMYLASE in the  last 168 hours. No results for input(s): AMMONIA in the last 168 hours. Coagulation Profile: No results for input(s): INR, PROTIME in the last 168 hours. Cardiac Enzymes: No results for input(s): CKTOTAL, CKMB, CKMBINDEX, TROPONINI in the last 168 hours. BNP (last 3 results) No results for input(s): PROBNP in the last 8760 hours. HbA1C: No results for input(s): HGBA1C in the last 72 hours. CBG: No results for input(s): GLUCAP in the last 168 hours. Lipid Profile: No results for input(s): CHOL, HDL, LDLCALC, TRIG, CHOLHDL, LDLDIRECT in the last 72 hours. Thyroid Function Tests: Recent Labs    01/01/20 0916  TSH 1.147   Anemia Panel: No results for input(s): VITAMINB12, FOLATE, FERRITIN, TIBC, IRON, RETICCTPCT in the last 72 hours.  --------------------------------------------------------------------------------------------------------------- Urine analysis:    Component Value Date/Time   COLORURINE AMBER (A) 11/22/2018 1152   APPEARANCEUR HAZY (A) 11/22/2018 1152   LABSPEC 1.015 11/22/2018 1152   PHURINE 5.0 11/22/2018 1152   GLUCOSEU NEGATIVE 11/22/2018 1152   HGBUR NEGATIVE 11/22/2018 1152   BILIRUBINUR SMALL (A) 11/22/2018 1152   KETONESUR TRACE (A) 11/22/2018 1152   PROTEINUR NEGATIVE 11/22/2018 1152   NITRITE NEGATIVE 11/22/2018 1152   LEUKOCYTESUR TRACE (A) 11/22/2018 1152      Imaging Results:     DG Chest Portable 1 View  Result Date: 01/03/2020 CLINICAL DATA:  Short of breath, oral swelling, history of squamous cell lung cancer and lymphoma EXAM: PORTABLE CHEST 1 VIEW COMPARISON:  12/25/2019 FINDINGS: Single frontal view of the chest demonstrates right chest wall port unchanged. Cardiac silhouette is stable. There is increasing consolidation and effusion within the left hemithorax. Stable nodularity within the right midlung zone. Small amount of fluid within the minor fissure. Increased vascular congestion since prior study.  No pneumothorax. IMPRESSION: 1. Worsening left lung consolidation and effusion. 2. Stable nodularity within the right midlung zone. 3. Increased vascular congestion since prior study. Electronically Signed   By: Randa Ngo M.D.   On: 01/03/2020 18:20    My personal review of EKG: A. fib with a rate of 121  Assessment & Plan:    Active Problems:   Pneumonia   1. Acute hypoxic respiratory failure 1. Most likely secondary to edema versus infiltrate versus commendation of the 2 2. Lasix and cefepime started in ED 3. Continue cefepime 4. Continue 4 L nasal cannula 5. Continue pulse ox 6. Wean off O2 as tolerated 7. Given new onset A. fib, increased risk with cancer, respiratory failure -CTA to rule out PE 8. Continue to monitor 2. Sepsis secondary to pneumonia in immunocompromised patient 1. Recently finished Levaquin outpatient 2. Worsening findings on chest x-ray 3. Continue cefepime 4. Holding off on fluid bolus given possible pulmonary edema on imaging 5. Routine sputum cultures 6. Blood cultures -these will have to be obtained after antibiotics since antibiotics were given prior to admission 7. Urine strep and Legionella 8. Continue to monitor 3. A. fib with RVR 1. Heart rate at time of exam is 148 2. EKG shows A. fib with RVR rate 121 3. No documented history of A. Fib 4. Likely secondary to pulmonary disease 5. Echo in the a.m. 6. TSH  pending 7. Initial Trope 14, trend tonight 8. CHA2DS2-VASc 5 9. Start heparin drip 10. Will likely need bridging with warfarin 4. Oral candidiasis 1. Start Magic mouthwash with lidocaine 5. CHF 1. Echo in the a.m. 2. Given soft blood pressures (systolic in the 77O) holding off on further diuresis 3. Monitoring I's and O's 4. Daily  weights 5. Continue to monitor 6. Protein calorie malnutrition 1. Encourage protein shake between meals    DVT Prophylaxis-  Heparin drip- SCDs   AM Labs Ordered, also please review Full Orders  Family Communication: Admission, patients condition and plan of care including tests being ordered have been discussed with the patient and  Daughter who indicate understanding and agree with the plan. Code Status:  Full  Admission status: npatient :The appropriate admission status for this patient is INPATIENT. Inpatient status is judged to be reasonable and necessary in order to provide the required intensity of service to ensure the patient's safety. The patient's presenting symptoms, physical exam findings, and initial radiographic and laboratory data in the context of their chronic comorbidities is felt to place them at high risk for further clinical deterioration. Furthermore, it is not anticipated that the patient will be medically stable for discharge from the hospital within 2 midnights of admission. The following factors support the admission status of inpatient.     The patient's presenting symptoms include dyspnea The worrisome physical exam findings include Tachypnea, tachycardia, worsening infiltrate vs edema on CXR The initial radiographic and laboratory data are worrisome because of worsening infiltrate vs edema on CXR The chronic co-morbidities include Sq cell carcinoma, HTN,        * I certify that at the point of admission it is my clinical judgment that the patient will require inpatient hospital care spanning beyond 2 midnights from the  point of admission due to high intensity of service, high risk for further deterioration and high frequency of surveillance required.*  Time spent in minutes : [place]

## 2020-01-03 NOTE — ED Triage Notes (Signed)
Pt to ED via POV with Dtr, pt started c/o of her lips being sore and burning yesterday. This morning her mouth was swollen. Pt has started 2 new medications- 1 chemo infusion and a breathing treatment. Pt started both on Thursday. Pt states that she is short of breath when she gets up and does things but that this is not a new symptom. Pt is in NAD at this time.  Pt is no on O2 at home.

## 2020-01-03 NOTE — ED Triage Notes (Signed)
First RN Note: Pt presents to ED via [place] with her daughter, pt is deaf. Pt's daughter reports that patient had a reaction to new chemo she received several days ago, pt's daughter reports swelling to lips, pt with mild swelling noted to lips upon arrival to ED, sores noted to patient's tongue on arrival.

## 2020-01-03 NOTE — Telephone Encounter (Signed)
Patient's daughter called reporting that patient has developed lip swelling/burning and patient cannot eat.  Daughter looked at patient's mouth and there is no tongue swelling.  No breathing difficulties.  Patient received gemcitabine on Thursday.  Possible allergic reactions.  Given that patient does not have any tongue swelling or breathing difficulties at this point, I recommend patient to go to urgent care for further evaluation.  Also advised patient's daughter that if at any point that patient develops breathing problems or tongue swelling, advised patient to go to ER right away.  She appreciates the call and will take patient to urgent care.

## 2020-01-04 ENCOUNTER — Inpatient Hospital Stay (HOSPITAL_COMMUNITY)
Admit: 2020-01-04 | Discharge: 2020-01-04 | Disposition: A | Discharge status: Home / Self Care | Payer: Medicare HMO | Attending: Family Medicine | Admitting: Family Medicine

## 2020-01-04 DIAGNOSIS — K130 Diseases of lips: Secondary | ICD-10-CM

## 2020-01-04 DIAGNOSIS — J9601 Acute respiratory failure with hypoxia: Secondary | ICD-10-CM

## 2020-01-04 DIAGNOSIS — I509 Heart failure, unspecified: Secondary | ICD-10-CM

## 2020-01-04 DIAGNOSIS — R918 Other nonspecific abnormal finding of lung field: Secondary | ICD-10-CM

## 2020-01-04 DIAGNOSIS — A419 Sepsis, unspecified organism: Principal | ICD-10-CM

## 2020-01-04 DIAGNOSIS — J189 Pneumonia, unspecified organism: Secondary | ICD-10-CM

## 2020-01-04 DIAGNOSIS — K1230 Oral mucositis (ulcerative), unspecified: Secondary | ICD-10-CM

## 2020-01-04 DIAGNOSIS — I340 Nonrheumatic mitral (valve) insufficiency: Secondary | ICD-10-CM

## 2020-01-04 DIAGNOSIS — R6520 Severe sepsis without septic shock: Secondary | ICD-10-CM

## 2020-01-04 DIAGNOSIS — C3490 Malignant neoplasm of unspecified part of unspecified bronchus or lung: Secondary | ICD-10-CM

## 2020-01-04 DIAGNOSIS — I361 Nonrheumatic tricuspid (valve) insufficiency: Secondary | ICD-10-CM

## 2020-01-04 LAB — CBC WITH DIFFERENTIAL/PLATELET
Abs Immature Granulocytes: 0.24 10*3/uL — ABNORMAL HIGH (ref 0.00–0.07)
Basophils Absolute: 0 10*3/uL (ref 0.0–0.1)
Basophils Relative: 0 %
Eosinophils Absolute: 0 10*3/uL (ref 0.0–0.5)
Eosinophils Relative: 0 %
HCT: 29.3 % — ABNORMAL LOW (ref 36.0–46.0)
Hemoglobin: 9.6 g/dL — ABNORMAL LOW (ref 12.0–15.0)
Immature Granulocytes: 1 %
Lymphocytes Relative: 3 %
Lymphs Abs: 0.6 10*3/uL — ABNORMAL LOW (ref 0.7–4.0)
MCH: 32.1 pg (ref 26.0–34.0)
MCHC: 32.8 g/dL (ref 30.0–36.0)
MCV: 98 fL (ref 80.0–100.0)
Monocytes Absolute: 0.1 10*3/uL (ref 0.1–1.0)
Monocytes Relative: 0 %
Neutro Abs: 21 10*3/uL — ABNORMAL HIGH (ref 1.7–7.7)
Neutrophils Relative %: 96 %
Platelets: 361 10*3/uL (ref 150–400)
RBC: 2.99 MIL/uL — ABNORMAL LOW (ref 3.87–5.11)
RDW: 17.9 % — ABNORMAL HIGH (ref 11.5–15.5)
WBC: 21.9 10*3/uL — ABNORMAL HIGH (ref 4.0–10.5)
nRBC: 0 % (ref 0.0–0.2)

## 2020-01-04 LAB — HEPARIN LEVEL (UNFRACTIONATED)
Heparin Unfractionated: 1.52 IU/mL — ABNORMAL HIGH (ref 0.30–0.70)
Heparin Unfractionated: <0.1 IU/mL — ABNORMAL LOW (ref 0.30–0.70)
Heparin Unfractionated: <0.1 IU/mL — ABNORMAL LOW (ref 0.30–0.70)

## 2020-01-04 LAB — APTT
aPTT: >160 seconds (ref 24–36)
aPTT: >160 seconds (ref 24–36)

## 2020-01-04 LAB — PROCALCITONIN: Procalcitonin: 0.58 ng/mL

## 2020-01-04 LAB — COMPREHENSIVE METABOLIC PANEL
ALT: 14 U/L (ref 0–44)
AST: 21 U/L (ref 15–41)
Albumin: 2.3 g/dL — ABNORMAL LOW (ref 3.5–5.0)
Alkaline Phosphatase: 78 U/L (ref 38–126)
Anion gap: 6 (ref 5–15)
BUN: 20 mg/dL (ref 8–23)
CO2: 27 mmol/L (ref 22–32)
Calcium: 9.2 mg/dL (ref 8.9–10.3)
Chloride: 99 mmol/L (ref 98–111)
Creatinine, Ser: 0.95 mg/dL (ref 0.44–1.00)
GFR calc Af Amer: >60 mL/min (ref >60)
GFR calc non Af Amer: 58 mL/min — ABNORMAL LOW (ref >60)
Glucose, Bld: 131 mg/dL — ABNORMAL HIGH (ref 70–99)
Potassium: 4.5 mmol/L (ref 3.5–5.1)
Sodium: 132 mmol/L — ABNORMAL LOW (ref 135–145)
Total Bilirubin: 1.6 mg/dL — ABNORMAL HIGH (ref 0.3–1.2)
Total Protein: 6.4 g/dL — ABNORMAL LOW (ref 6.5–8.1)

## 2020-01-04 LAB — URINALYSIS, COMPLETE (UACMP) WITH MICROSCOPIC
Bilirubin Urine: NEGATIVE
Glucose, UA: NEGATIVE mg/dL
Hgb urine dipstick: NEGATIVE
Ketones, ur: NEGATIVE mg/dL
Leukocytes,Ua: NEGATIVE
Nitrite: NEGATIVE
Protein, ur: NEGATIVE mg/dL
Specific Gravity, Urine: 1.03 (ref 1.005–1.030)
pH: 5 (ref 5.0–8.0)

## 2020-01-04 LAB — PROTIME-INR
INR: 1.6 — ABNORMAL HIGH (ref 0.8–1.2)
Prothrombin Time: 18.4 seconds — ABNORMAL HIGH (ref 11.4–15.2)

## 2020-01-04 LAB — SARS CORONAVIRUS 2 BY RT PCR (HOSPITAL ORDER, PERFORMED IN ~~LOC~~ HOSPITAL LAB): SARS Coronavirus 2: NEGATIVE

## 2020-01-04 LAB — HIV ANTIBODY (ROUTINE TESTING W REFLEX): HIV Screen 4th Generation wRfx: NONREACTIVE

## 2020-01-04 LAB — MAGNESIUM: Magnesium: 1.8 mg/dL (ref 1.7–2.4)

## 2020-01-04 LAB — LACTIC ACID, PLASMA: Lactic Acid, Venous: 1 mmol/L (ref 0.5–1.9)

## 2020-01-04 LAB — GLUCOSE, CAPILLARY: Glucose-Capillary: 119 mg/dL — ABNORMAL HIGH (ref 70–99)

## 2020-01-04 LAB — MRSA PCR SCREENING: MRSA by PCR: NEGATIVE

## 2020-01-04 LAB — TROPONIN I (HIGH SENSITIVITY)
Troponin I (High Sensitivity): 21 ng/L — ABNORMAL HIGH (ref <18)
Troponin I (High Sensitivity): 19 ng/L — ABNORMAL HIGH (ref <18)

## 2020-01-04 LAB — BILIRUBIN, DIRECT: Bilirubin, Direct: 0.2 mg/dL (ref 0.0–0.2)

## 2020-01-04 LAB — TSH: TSH: 0.473 u[IU]/mL (ref 0.350–4.500)

## 2020-01-04 LAB — STREP PNEUMONIAE URINARY ANTIGEN: Strep Pneumo Urinary Antigen: NEGATIVE

## 2020-01-04 MED ORDER — SODIUM CHLORIDE 0.9 % IV SOLN
2.0000 g | Freq: Three times a day (TID) | INTRAVENOUS | Status: DC
  Administered 2020-01-04: 2 g via INTRAVENOUS
  Filled 2020-01-04 (×3): qty 2

## 2020-01-04 MED ORDER — CLOTRIMAZOLE 1 % EX CREA
TOPICAL_CREAM | Freq: Two times a day (BID) | CUTANEOUS | Status: DC
  Administered 2020-01-05: 1 via TOPICAL
  Filled 2020-01-04: qty 15

## 2020-01-04 MED ORDER — ADULT MULTIVITAMIN W/MINERALS CH
1.0000 | ORAL_TABLET | Freq: Every day | ORAL | Status: DC
  Administered 2020-01-04 – 2020-01-09 (×6): 1 via ORAL
  Filled 2020-01-04 (×6): qty 1

## 2020-01-04 MED ORDER — ENSURE ENLIVE PO LIQD
237.0000 mL | Freq: Two times a day (BID) | ORAL | Status: DC
  Administered 2020-01-04 – 2020-01-06 (×5): 237 mL via ORAL

## 2020-01-04 MED ORDER — CHLORHEXIDINE GLUCONATE CLOTH 2 % EX PADS
6.0000 | MEDICATED_PAD | Freq: Every day | CUTANEOUS | Status: DC
  Administered 2020-01-04 – 2020-01-09 (×5): 6 via TOPICAL
  Filled 2020-01-04: qty 6

## 2020-01-04 MED ORDER — ALBUTEROL SULFATE (2.5 MG/3ML) 0.083% IN NEBU
2.5000 mg | INHALATION_SOLUTION | Freq: Four times a day (QID) | RESPIRATORY_TRACT | Status: DC
  Administered 2020-01-04 – 2020-01-09 (×24): 2.5 mg via RESPIRATORY_TRACT
  Filled 2020-01-04 (×24): qty 3

## 2020-01-04 MED ORDER — PANTOPRAZOLE SODIUM 40 MG PO TBEC
40.0000 mg | DELAYED_RELEASE_TABLET | Freq: Every day | ORAL | Status: DC
  Administered 2020-01-04 – 2020-01-09 (×6): 40 mg via ORAL
  Filled 2020-01-04 (×6): qty 1

## 2020-01-04 MED ORDER — LIDOCAINE VISCOUS HCL 2 % MT SOLN
15.0000 mL | Freq: Three times a day (TID) | OROMUCOSAL | Status: DC | PRN
  Administered 2020-01-04 – 2020-01-05 (×2): 15 mL via OROMUCOSAL
  Filled 2020-01-04 (×2): qty 15

## 2020-01-04 MED ORDER — HEPARIN (PORCINE) 25000 UT/250ML-% IV SOLN
1150.0000 [IU]/h | INTRAVENOUS | Status: DC
  Administered 2020-01-04: 700 [IU]/h via INTRAVENOUS
  Administered 2020-01-05: 850 [IU]/h via INTRAVENOUS
  Administered 2020-01-06: 03:00:00 1150 [IU]/h via INTRAVENOUS
  Filled 2020-01-04 (×2): qty 250

## 2020-01-04 MED ORDER — METHYLPREDNISOLONE SODIUM SUCC 125 MG IJ SOLR
125.0000 mg | Freq: Once | INTRAMUSCULAR | Status: AC
  Administered 2020-01-04: 125 mg via INTRAVENOUS
  Filled 2020-01-04: qty 2

## 2020-01-04 MED ORDER — VITAMIN D 25 MCG (1000 UNIT) PO TABS
1000.0000 [IU] | ORAL_TABLET | Freq: Every day | ORAL | Status: DC
  Administered 2020-01-04 – 2020-01-09 (×6): 1000 [IU] via ORAL
  Filled 2020-01-04 (×6): qty 1

## 2020-01-04 MED ORDER — MAGIC MOUTHWASH W/LIDOCAINE: 5.0000 mL | Freq: Three times a day (TID) | ORAL | Status: DC | PRN

## 2020-01-04 MED ORDER — OMEGA-3-ACID ETHYL ESTERS 1 G PO CAPS
1.0000 g | ORAL_CAPSULE | Freq: Every day | ORAL | Status: DC
  Administered 2020-01-04 – 2020-01-09 (×6): 1 g via ORAL
  Filled 2020-01-04 (×7): qty 1

## 2020-01-04 MED ORDER — DULOXETINE HCL 30 MG PO CPEP
60.0000 mg | ORAL_CAPSULE | Freq: Every day | ORAL | Status: DC
  Administered 2020-01-04 – 2020-01-09 (×6): 60 mg via ORAL
  Filled 2020-01-04 (×6): qty 2

## 2020-01-04 MED ORDER — SODIUM CHLORIDE 0.9 % IV SOLN
2.0000 g | Freq: Two times a day (BID) | INTRAVENOUS | Status: DC
  Administered 2020-01-04 – 2020-01-05 (×2): 2 g via INTRAVENOUS
  Filled 2020-01-04 (×3): qty 2

## 2020-01-04 MED ORDER — HYDROCODONE-ACETAMINOPHEN 5-325 MG PO TABS
1.0000 | ORAL_TABLET | ORAL | Status: DC | PRN
  Administered 2020-01-04 – 2020-01-07 (×2): 1 via ORAL
  Filled 2020-01-04 (×2): qty 1

## 2020-01-04 MED ORDER — ASPIRIN EC 81 MG PO TBEC
81.0000 mg | DELAYED_RELEASE_TABLET | Freq: Every day | ORAL | Status: DC
  Administered 2020-01-04 – 2020-01-09 (×6): 81 mg via ORAL
  Filled 2020-01-04 (×6): qty 1

## 2020-01-04 MED ORDER — GUAIFENESIN-DM 100-10 MG/5ML PO SYRP
5.0000 mL | ORAL_SOLUTION | ORAL | Status: DC | PRN
  Filled 2020-01-04: qty 5

## 2020-01-04 MED ORDER — ORAL CARE MOUTH RINSE
15.0000 mL | Freq: Two times a day (BID) | OROMUCOSAL | Status: DC
  Administered 2020-01-04 – 2020-01-09 (×9): 15 mL via OROMUCOSAL

## 2020-01-04 MED ORDER — HEPARIN (PORCINE) 25000 UT/250ML-% IV SOLN
700.0000 [IU]/h | INTRAVENOUS | Status: DC
  Administered 2020-01-04: 700 [IU]/h via INTRAVENOUS

## 2020-01-04 MED ORDER — DEXTROMETHORPHAN HBR 15 MG/5ML PO SYRP: 10.0000 mL | ORAL_SOLUTION | Freq: Four times a day (QID) | ORAL | Status: DC | PRN

## 2020-01-04 MED ORDER — ATORVASTATIN CALCIUM 20 MG PO TABS
80.0000 mg | ORAL_TABLET | Freq: Every evening | ORAL | Status: DC
  Administered 2020-01-04 – 2020-01-09 (×6): 80 mg via ORAL
  Filled 2020-01-04 (×4): qty 4

## 2020-01-04 NOTE — Progress Notes (Signed)
PHARMACY NOTE:  ANTIMICROBIAL RENAL DOSAGE ADJUSTMENT  Current antimicrobial regimen includes a mismatch between antimicrobial dosage and estimated renal function.  As per policy approved by the Pharmacy & Therapeutics and Medical Executive Committees, the antimicrobial dosage will be adjusted accordingly.  Current antimicrobial dosage:  Cefepime 2gm IV Q8H  Indication: HCAP  Renal Function:  Estimated Creatinine Clearance: 49.9 mL/min (by C-G formula based on SCr of 0.95 mg/dL).     Antimicrobial dosage has been changed to:  Cefepime 2gm IV Q12H   Thank you for allowing pharmacy to be a part of this patient's care.  Cheri Guppy, [facility] 01/04/2020 11:42 AM

## 2020-01-04 NOTE — Consult Note (Addendum)
Hematology/Oncology Consult note [facility] [phone] [phone]  Patient Care Team: Francesca Oman, DO as PCP - General (Internal Medicine) Lequita Asal, MD as Medical Oncologist (Hematology and Oncology) Tyler Pita, MD as Consulting Physician (Pulmonary Disease)   Name of the patient: Toni Griffith  [id number]  1941/02/14   Date of visit: 01/04/20 REASON FOR COSULTATION:  Squamous cell lung cancer and lymphoma  History of presenting illness-  79 y.o. female with PMH listed including history of squamous cell lung cancer currently on chemotherapy with gemcitabine, history of gray zone lymphoma, deafness, hypertension, GERD, arthritis who is currently admitted in ICU. Patient's daughter called me yesterday reporting that patient complained lip swelling and burning sensation. Patient follows up with [name] for stage IV squamous cell lung cancer and history of gray zone lymphoma. Patient has finished chemotherapy for the treatment of gray zone lymphoma. Regarding to patient stage IV squamous cell lung cancer, patient has previously received radiation and chemotherapy. More recently, CT chest abdomen pelvis on 12/02/2019 showed new moderate left sided effusion and thoracentesis on 12/05/2019 confirmed non-small cell lung cancer.  Due to patient's performance status, patient was started on single agent gemcitabine and received her first dose on 01/01/2020. Given that patient has complained about lip swelling, there was concern of possible drug allergy/chemotherapy toxicities, I advised patient to go to emergency room for further evaluation. In the emergency room, her lip swelling was not very impressive.  She appears to have some inflammation at the corners of her mouth.  She also provided additional history of pain when she opens her mouth wide.  She also reports somewhat more shortness of breath than her baseline.  She does not use  oxygen at home. ER physician noticed that patient has tachypnea, tachycardia also an elevated total white count,. Chest x-ray showed worsening left lung consolidation effusion.  Stable nodularity within the right midlung zone.  Increased vascular congestion from prior study.  Patient was noticed to have A. fib with RVR with a rate of 148.  proBNP was also elevated in 400s. Patient was admitted for new onset of atrial fibrillation with RVR, working diagnosis of possible pneumonia and CHF exacerbation.  She was admitted to ICU overnight.  Started on IV antibiotics.  CT chest PE protocol was obtained which showed enlarged left hilar mass and right lower lobe lung nodules.  Stable multilobular left pleural effusion.  Trace right pleural effusion.   Review of Systems  Unable to perform ROS: Other (patient is deaf.  She uses sign language use)  Constitutional: Positive for appetite change and fatigue. Negative for chills and fever.  HENT:         Burning sensation at the corners of her mouth.   Respiratory: Positive for shortness of breath.   Cardiovascular: Negative for chest pain and leg swelling.  Gastrointestinal: Negative for abdominal distention, abdominal pain and nausea.  Genitourinary: Negative for dysuria.   Skin: Negative for rash.  Neurological: Negative for headaches.  Hematological: Bruises/bleeds easily.  Psychiatric/Behavioral: Negative for confusion.    No Known Allergies  Patient Active Problem List   Diagnosis Date Noted  . Sepsis with acute hypoxic respiratory failure ([place])   . Lung mass   . Community acquired pneumonia of right lung 01/03/2020  . Congestive heart failure ([place])   . Cancer-related pain 12/21/2019  . Chills 12/10/2019  . Tachycardia 12/10/2019  . Tachypnea 12/10/2019  . Pleural effusion, left 12/03/2019  . Hypomagnesemia 10/18/2019  .  Tinnitus of both ears 09/05/2019  . B12 deficiency 08/29/2019  . Arthritis of both knees 04/11/2019  . Primary  osteoarthritis of both knees 12/22/2018  . Mediastinal adenopathy   . Encounter for antineoplastic chemotherapy 05/16/2018  . Encounter for antineoplastic immunotherapy 05/16/2018  . Hyponatremia 02/01/2018  . Chronic pain of left knee 12/23/2017  . Chemotherapy-induced peripheral neuropathy ([place]) 11/24/2017  . Pain of both hip joints 11/24/2017  . Encounter for monoclonal antibody treatment for malignancy 11/23/2017  . Goals of care, counseling/discussion 11/01/2017  . 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 Osteoarthritis of left knee 04/27/2017  . Squamous cell lung cancer ([place]) 09/05/2016  . Hypokalemia 04/29/2016  . Neuropathy associated with cancer ([place]) 04/13/2016  . Hypercalcemia 03/30/2016  . Anemia 03/30/2016  . Non-Hodgkin's lymphoma ([place]) 03/08/2016  . Hodgkin's lymphoma ([place]) 03/08/2016  . 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 Bone metastasis ([place]) 03/03/2016  . Stage 3 chronic kidney disease 03/01/2016  . Gait abnormality 02/24/2016  . Nontoxic single thyroid nodule 02/21/2016  . History of coronary artery bypass graft x 2 02/20/2016  . Fatigue 02/20/2016  . Hiatal hernia 02/04/2016  . Esophageal dysphagia 12/31/2015  . Restless leg syndrome 12/28/2015  . Obesity 12/28/2015  . Mixed hyperlipidemia 12/28/2015  . GERD without esophagitis 12/28/2015  . Essential hypertension 12/28/2015  . Simple chronic bronchitis ([place]) 10/14/2015  . 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 Congenital deafness 10/14/2015     Past Medical History:  Diagnosis Date  . Arthritis   . Cancer [facility])    lymphoma-right hip  . Deaf   . GERD (gastroesophageal reflux disease)   . Hypertension    NOT ON MED AT PRESENT  . Squamous cell lung cancer [facility])      Past Surgical History:  Procedure Laterality Date  . CARDIAC SURGERY  1993    . CORONARY ARTERY BYPASS GRAFT     DOUBLE  . CYST REMOVAL HAND Right   . ENDOBRONCHIAL ULTRASOUND N/A 05/30/2018   Procedure: ENDOBRONCHIAL ULTRASOUND;  Surgeon: Tyler Pita, MD;  Location: [facility] ORS;  Service: Cardiopulmonary;  Laterality: N/A;  . PERIPHERAL VASCULAR CATHETERIZATION N/A 04/06/2016   Procedure: Glori Luis Cath Insertion;  Surgeon: Algernon Huxley, MD;  Location: [place] [facility];  Service: Cardiovascular;  Laterality: N/A;    Social History   Socioeconomic History  . Marital status: Single    Spouse name: Not on file  . Number of children: Not on file  . Years of education: Not on file  . Highest education level: Not on file  Occupational History  . Not on file  Tobacco Use  . Smoking status: Former Smoker    Packs/day: 2.00    Years: 40.00    Pack years: 80.00    Types: Cigarettes    Quit date: 11/21/2015    Years since quitting: 4.1  . Smokeless tobacco: Never Used  Substance and Sexual Activity  . Alcohol use: Yes    Comment: rare  . Drug use: No  . Sexual activity: Never  Other Topics Concern  . Not on file  Social History Narrative  . Not on file   Social Determinants of Health   Financial Resource Strain:   . Difficulty of Paying Living Expenses:   Food Insecurity:   . Worried About Charity fundraiser in the Last Year:   . [employer]  of Food in the Last Year:   Transportation Needs:   . Film/video editor (Medical):   Marland Kitchen Lack of Transportation (Non-Medical):   Physical Activity:   . Days of Exercise per Week:   . Minutes of Exercise per Session:   Stress:   . Feeling of Stress :   Social Connections:   . Frequency of Communication with Friends and Family:   . Frequency of Social Gatherings with Friends and Family:   . Attends Religious Services:   . Active Member of Clubs or Organizations:   . Attends Archivist Meetings:   Marland Kitchen Marital Status:   Intimate Partner Violence:   . Fear of Current or Ex-Partner:   . Emotionally  Abused:   Marland Kitchen Physically Abused:   . Sexually Abused:      Family History  Problem Relation Age of Onset  . Lung cancer Mother   . Lung cancer Father   . Diabetes Maternal Uncle   . Myasthenia gravis Maternal Grandmother   . Leukemia Maternal Grandfather      Current Facility-Administered Medications:  .  albuterol (PROVENTIL) (2.5 MG/3ML) 0.083% nebulizer solution 2.5 mg, 2.5 mg, Nebulization, Q6H, Zierle-Ghosh, Asia B, DO, 2.5 mg at 01/04/20 1452 .  aspirin EC tablet 81 mg, 81 mg, Oral, Daily, Zierle-Ghosh, Asia B, DO, 81 mg at 01/04/20 0940 .  atorvastatin (LIPITOR) tablet 80 mg, 80 mg, Oral, QPM, Zierle-Ghosh, Asia B, DO .  ceFEPIme (MAXIPIME) 2 g in sodium chloride 0.9 % 100 mL IVPB, 2 g, Intravenous, Q12H, Fritzi Mandes, MD .  Chlorhexidine Gluconate Cloth 2 % PADS 6 each, 6 each, Topical, Daily, Zierle-Ghosh, Asia B, DO, 6 each at 01/04/20 0942 .  cholecalciferol (VITAMIN D3) tablet 1,000 Units, 1,000 Units, Oral, Daily, Zierle-Ghosh, Asia B, DO, 1,000 Units at 01/04/20 0941 .  clotrimazole (LOTRIMIN) 1 % cream, , Topical, BID, Earlie Server, MD .  diphenhydrAMINE (BENADRYL) injection 25 mg, 25 mg, Intravenous, Q6H PRN, Zierle-Ghosh, Asia B, DO .  DULoxetine (CYMBALTA) DR capsule 60 mg, 60 mg, Oral, Daily, Zierle-Ghosh, Asia B, DO, 60 mg at 01/04/20 0941 .  famotidine (PEPCID) tablet 20 mg, 20 mg, Oral, BID, Zierle-Ghosh, Asia B, DO, 20 mg at 01/04/20 0941 .  feeding supplement (ENSURE ENLIVE) (ENSURE ENLIVE) liquid 237 mL, 237 mL, Oral, BID, Zierle-Ghosh, Asia B, DO, 237 mL at 01/04/20 0942 .  guaiFENesin-dextromethorphan (ROBITUSSIN DM) 100-10 MG/5ML syrup 5 mL, 5 mL, Oral, Q4H PRN, Zierle-Ghosh, Asia B, DO .  heparin ADULT infusion 100 units/mL (25000 units/277mL sodium chloride 0.45%), 700 Units/hr, Intravenous, Continuous, Hallaji, Sheema M, RPH, Last Rate: 7 mL/hr at 01/04/20 1510, 700 Units/hr at 01/04/20 1510 .  HYDROcodone-acetaminophen (NORCO/VICODIN) 5-325 MG per tablet 1  tablet, 1 tablet, Oral, Q4H PRN, Zierle-Ghosh, Asia B, DO, 1 tablet at 01/04/20 0034 .  lidocaine (XYLOCAINE) 2 % viscous mouth solution 15 mL, 15 mL, Mouth/Throat, TID PRN, Fritzi Mandes, MD .  magic mouthwash, 15 mL, Oral, TID, Zierle-Ghosh, Asia B, DO, 15 mL at 01/04/20 1508 .  MEDLINE mouth rinse, 15 mL, Mouth Rinse, BID, Sharion Settler, NP, 15 mL at 01/04/20 0943 .  metoprolol tartrate (LOPRESSOR) injection 5 mg, 5 mg, Intravenous, Q6H, Zierle-Ghosh, Asia B, DO, 5 mg at 01/04/20 1219 .  multivitamin with minerals tablet 1 tablet, 1 tablet, Oral, Daily, Zierle-Ghosh, Asia B, DO, 1 tablet at 01/04/20 0941 .  omega-3 acid ethyl esters (LOVAZA) capsule 1 g, 1 g, Oral, Daily, Zierle-Ghosh, Asia B, DO, 1 g at 01/04/20  0941 .  pantoprazole (PROTONIX) EC tablet 40 mg, 40 mg, Oral, Daily, Zierle-Ghosh, Asia B, DO, 40 mg at 01/04/20 0941 .  polyethylene glycol (MIRALAX / GLYCOLAX) packet 17 g, 17 g, Oral, BID, Zierle-Ghosh, Asia B, DO, 17 g at 01/04/20 5852  Facility-Administered Medications Ordered in Other Encounters:  .  heparin lock flush 100 unit/mL, 500 Units, Intravenous, Once, Corcoran, Melissa C, MD .  sodium chloride flush (NS) 0.9 % injection 10 mL, 10 mL, Intravenous, PRN, Mike Gip, Melissa C, MD, 10 mL at 04/05/17 1034 .  sodium chloride flush (NS) 0.9 % injection 10 mL, 10 mL, Intravenous, PRN, Nolon Stalls C, MD, 10 mL at 02/27/19 1033   Physical exam:  Vitals:   01/04/20 0700 01/04/20 0800 01/04/20 1148 01/04/20 1400  BP: 127/76 (!) 141/103 (!) 132/55 106/69  Pulse: 100 (!) 106 (!) 110 (!) 50  Resp: (!) 32 (!) 28 20 (!) 27  Temp:  98 F (36.7 C) 98.5 F (36.9 C)   TempSrc:  Oral    SpO2: 96% 97% 95% (!) 82%  Weight:      Height:       Physical Exam  Constitutional: She is oriented to person, place, and time. No distress.  HENT:  Head: Normocephalic and atraumatic.  Mouth/Throat: No oropharyngeal exudate.  Mucositis  Eyes: Pupils are equal, round, and reactive to  light. EOM are normal. No scleral icterus.  Cardiovascular:  Irregular irregular heartbeats  Pulmonary/Chest: Effort normal. No respiratory distress.  Diminished breath sounds at the left lower base. Decreased breath sound in the right lower lung, mild wheezing  Abdominal: Soft. She exhibits no distension. There is no abdominal tenderness.  Musculoskeletal:        General: No edema. Normal range of motion.     Cervical back: Normal range of motion and neck supple.  Neurological: She is alert and oriented to person, place, and time. No cranial nerve deficit. She exhibits normal muscle tone. Coordination normal.  Skin: Skin is warm and dry. No erythema.  Psychiatric: Affect normal.        CMP Latest Ref Rng & Units 01/04/2020  Glucose 70 - 99 mg/dL 131(H)  BUN 8 - 23 mg/dL 20  Creatinine 0.44 - 1.00 mg/dL 0.95  Sodium 135 - 145 mmol/L 132(L)  Potassium 3.5 - 5.1 mmol/L 4.5  Chloride 98 - 111 mmol/L 99  CO2 22 - 32 mmol/L 27  Calcium 8.9 - 10.3 mg/dL 9.2  Total Protein 6.5 - 8.1 g/dL 6.4(L)  Total Bilirubin 0.3 - 1.2 mg/dL 1.6(H)  Alkaline Phos 38 - 126 U/L 78  AST 15 - 41 U/L 21  ALT 0 - 44 U/L 14   CBC Latest Ref Rng & Units 01/04/2020  WBC 4.0 - 10.5 K/uL 21.9(H)  Hemoglobin 12.0 - 15.0 g/dL 9.6(L)  Hematocrit 36.0 - 46.0 % 29.3(L)  Platelets 150 - 400 K/uL 361    RADIOGRAPHIC STUDIES: I have personally reviewed the radiological images as listed and agreed with the findings in the report. DG Chest 2 View  Result Date: 12/25/2019 CLINICAL DATA:  79 year old female with history of left-sided pleural effusion. EXAM: CHEST - 2 VIEW COMPARISON:  Chest x-ray 12/05/2019. FINDINGS: Mass-like opacity in the left mid lung again noted. Ill-defined opacities throughout the left mid to lower lung. Trace left pleural effusion. Linear area of architectural distortion in the right mid lung. Small pulmonary nodule measuring 11 mm in the lateral aspect of the right mid lung, similar to the  prior  examination. No evidence of pulmonary edema. Cardiac silhouette is largely obscured. Aortic atherosclerosis. Status post median sternotomy for CABG. IMPRESSION: 1. The appearance of the chest is very similar to the prior study 12/05/2019 with chronic changes of known lung cancer and postradiation changes, as detailed above. 2. Trace left pleural effusion. 3. Aortic atherosclerosis. Electronically Signed   By: Vinnie Langton M.D.   On: 12/25/2019 10:51   CT ANGIO CHEST PE W OR WO CONTRAST  Result Date: 01/03/2020 CLINICAL DATA:  Shortness of breath, oral swelling, history of squamous cell lung cancer and lymphoma EXAM: CT ANGIOGRAPHY CHEST WITH CONTRAST TECHNIQUE: Multidetector CT imaging of the chest was performed using the standard protocol during bolus administration of intravenous contrast. Multiplanar CT image reconstructions and MIPs were obtained to evaluate the vascular anatomy. CONTRAST:  76mL OMNIPAQUE IOHEXOL 350 MG/ML SOLN COMPARISON:  12/02/2019, 01/03/2020 FINDINGS: Cardiovascular: This is a technically adequate evaluation of the pulmonary vasculature. There are no filling defects or pulmonary emboli. Stable atherosclerosis of the thoracic aorta and coronary vessels. Mediastinum/Nodes: Stable appearance of the thyroid, esophagus, and trachea. No pathologic mediastinal adenopathy. Lungs/Pleura: There is progressive left perihilar consolidation, consistent with progression of disease. Multilocular left pleural effusion is again noted, not appreciably changed in size. There is a new trace right pleural effusion. Increasing right lower lobe pulmonary nodule now measures 13 mm reference image 32, compatible with progression of disease. Upper Abdomen: Stable left lobe hepatic cyst. 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 Reconstructed images demonstrate no additional findings. Review of the MIP images confirms the above  findings. IMPRESSION: 1. Progression of disease, with enlarging left hilar mass and right lower lobe pulmonary nodule. 2. Stable multilocular left pleural effusion. 3. Trace right pleural effusion. Electronically Signed   By: Randa Ngo M.D.   On: 01/03/2020 22:31   DG Chest Left Decubitus  Result Date: 12/25/2019 CLINICAL DATA:  Assess left pleural effusion. History of left lung cancer. EXAM: CHEST - LEFT DECUBITUS COMPARISON:  Earlier same day and 12/05/2019, chest CT 12/02/2019 FINDINGS: Right lung demonstrates mild linear density over the mid lung likely atelectasis with stable right midlung nodule. Moderate stable opacification over the left midlung and left base. No definite free flowing left pleural effusion. Remainder of the exam is unchanged. IMPRESSION: Stable left midlung and left basilar opacification. No definite evidence of free-flowing left pleural effusion. Linear density right midlung unchanged likely atelectasis in stable right midlung nodule. Electronically Signed   By: Marin Olp M.D.   On: 12/25/2019 10:51   DG Chest Portable 1 View  Result Date: 01/03/2020 CLINICAL DATA:  Short of breath, oral swelling, history of squamous cell lung cancer and lymphoma EXAM: PORTABLE CHEST 1 VIEW COMPARISON:  12/25/2019 FINDINGS: Single frontal view of the chest demonstrates right chest wall port unchanged. Cardiac silhouette is stable. There is increasing consolidation and effusion within the left hemithorax. Stable nodularity within the right midlung zone. Small amount of fluid within the minor fissure. Increased vascular congestion since prior study.  No pneumothorax. IMPRESSION: 1. Worsening left lung consolidation and effusion. 2. Stable nodularity within the right midlung zone. 3. Increased vascular congestion since prior study. Electronically Signed   By: Randa Ngo M.D.   On: 01/03/2020 18:20    Assessment and plan- Patient is a 79 y.o. female with history of stage IV squamous cell  lung cancer, history of gray zone lymphoma, currently admitted in ICU.   #Acute hypoxic respiratory failure, most  likely secondary to vascular edema/pneumonia/pleural effusion .-Diuresis and IV antibiotics.  Procalcitonin is slightly elevated at 0.58.  Continue antibiotics for now.  #A. fib with RVR, check echocardiogram.  Patient has been started on heparin drip.  And beta-blocker. Acute CHF exacerbation, significantly elevated BNP.  #Stage IV squamous cell lung cancer CT was independently viewed by me discussed with patient and daughter. Progressive lung cancer.  Stable multilocular pleural effusion on the left. Hold off chemotherapy.  She does not seem to tolerate chemotherapy well. Follow-up with [name] for additional discussion of her options. Prognosis is poor  # Cheilitis, and mucositis Recommend clotrimazole 1% cream apply topically to the corners of her mouth.  Magic mouthwash swish and spit. #Elevated bilirubin, check direct bilirubin.  Possible due to recent chemotherapy Sign language interpreter online service was used for the entire encounter.  Thank you for allowing me to participate in the care of this patient.    Earlie Server, MD, PhD Hematology Oncology [facility] at [facility] Pager- [phone] 01/04/2020

## 2020-01-04 NOTE — Progress Notes (Signed)
Patient admitted to ICU 07 from the ED. This patient is deaf and relies on her daughter [name] for communication and interpreting conversations. She can also read lips very well and can communicate  By using the "interpreter on a stick." She is alert and oriented x4. Patient admitted to the ICU due to lip/throat soreness after receiving a new type of chemotherapy for lung cancer. In the ED, she was discovered to be in atrial fibrillation with rapid ventricular response. Due to this, she was given metoprolol to manage her heart rate and started on heparin. She also presented with shortness of breath with concerns for pneumonia, so she is receiving supplemental oxygen via nasal cannula at 5 liters, albuterol treatments, and antibiotics (cefepime). She also received one dose of solu-medrol (125 mg). She has an accessed implanted port and a peripheral IV for fluids and lab collection. She has a Purewick for output collection. She is currently resting comfortably and is in no apparent distress. All vitals are stable and call bell is within reach. Will continue to monitor.   Cameron Ali, RN

## 2020-01-04 NOTE — Progress Notes (Signed)
[facility] [place] NAME: Toni Griffith    MR#:  [id number]  DATE OF BIRTH:  [birth year]  SUBJECTIVE:   Sign language interpretation.  Patient came in with swelling and burning of her lower lips. Much improved able to swallow no tongue swelling. Had some shortness of breath. Currently getting IV heparin drip. Found to be an a fib with RVR. Heart rate fluctuates. Blood pressure is soft. REVIEW OF SYSTEMS:   Review of Systems  Unable to perform ROS: Other  pt nonverbal Tolerating Diet:yes Tolerating PT:   DRUG ALLERGIES:  No Known Allergies  VITALS:  Blood pressure (!) 141/103, pulse (!) 106, temperature 98 F (36.7 C), temperature source Oral, resp. rate (!) 28, height 5\' 3"  (1.6 m), weight 78.5 kg, SpO2 97 %.  PHYSICAL EXAMINATION:   Physical Exam  GENERAL:  79 y.o.-year-old patient lying in the bed with no acute distress.  EYES: Pupils equal, round, reactive to light and accommodation. No scleral icterus.   HEENT: Head atraumatic, normocephalic. Oropharynx and nasopharynx clear.  NECK:  Supple, no jugular venous distention. No thyroid enlargement, no tenderness.  LUNGS: Normal breath sounds bilaterally, no wheezing, rales, rhonchi. No use of accessory muscles of respiration. Port+ CARDIOVASCULAR: S1, S2 normal. No murmurs, rubs, or gallops. tachycardia ABDOMEN: Soft, nontender, nondistended. Bowel sounds present. No organomegaly or mass.  EXTREMITIES: No cyanosis, clubbing or edema b/l.    NEUROLOGIC: Cranial nerves II through XII are intact. No focal Motor or sensory deficits b/l.   PSYCHIATRIC:  patient is alert and oriented x 3. mute SKIN: No obvious rash, lesion, or ulcer.   LABORATORY PANEL:  CBC Recent Labs  Lab 01/04/20 0332  WBC 21.9*  HGB 9.6*  HCT 29.3*  PLT 361    Chemistries  Recent Labs  Lab 01/04/20 0332  NA 132*  K 4.5  CL 99  CO2 27  GLUCOSE 131*  BUN 20  CREATININE 0.95  CALCIUM 9.2  MG 1.8   AST 21  ALT 14  ALKPHOS 78  BILITOT 1.6*   Cardiac Enzymes No results for input(s): TROPONINI in the last 168 hours. RADIOLOGY:  CT ANGIO CHEST PE W OR WO CONTRAST  Result Date: 01/03/2020 CLINICAL DATA:  Shortness of breath, oral swelling, history of squamous cell lung cancer and lymphoma EXAM: CT ANGIOGRAPHY CHEST WITH CONTRAST TECHNIQUE: Multidetector CT imaging of the chest was performed using the standard protocol during bolus administration of intravenous contrast. Multiplanar CT image reconstructions and MIPs were obtained to evaluate the vascular anatomy. CONTRAST:  67mL OMNIPAQUE IOHEXOL 350 MG/ML SOLN COMPARISON:  12/02/2019, 01/03/2020 FINDINGS: Cardiovascular: This is a technically adequate evaluation of the pulmonary vasculature. There are no filling defects or pulmonary emboli. Stable atherosclerosis of the thoracic aorta and coronary vessels. Mediastinum/Nodes: Stable appearance of the thyroid, esophagus, and trachea. No pathologic mediastinal adenopathy. Lungs/Pleura: There is progressive left perihilar consolidation, consistent with progression of disease. Multilocular left pleural effusion is again noted, not appreciably changed in size. There is a new trace right pleural effusion. Increasing right lower lobe pulmonary nodule now measures 13 mm reference image 32, compatible with progression of disease. Upper Abdomen: Stable left lobe hepatic cyst. 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 Reconstructed images demonstrate no additional findings. Review of the MIP images confirms the above findings. IMPRESSION: 1. Progression of disease, with enlarging left hilar mass and right lower lobe pulmonary nodule. 2. Stable multilocular left pleural  effusion. 3. Trace right pleural effusion. Electronically Signed   By: Randa Ngo M.D.   On: 01/03/2020 22:31   DG Chest Portable 1 View  Result Date:  01/03/2020 CLINICAL DATA:  Short of breath, oral swelling, history of squamous cell lung cancer and lymphoma EXAM: PORTABLE CHEST 1 VIEW COMPARISON:  12/25/2019 FINDINGS: Single frontal view of the chest demonstrates right chest wall port unchanged. Cardiac silhouette is stable. There is increasing consolidation and effusion within the left hemithorax. Stable nodularity within the right midlung zone. Small amount of fluid within the minor fissure. Increased vascular congestion since prior study.  No pneumothorax. IMPRESSION: 1. Worsening left lung consolidation and effusion. 2. Stable nodularity within the right midlung zone. 3. Increased vascular congestion since prior study. Electronically Signed   By: Randa Ngo M.D.   On: 01/03/2020 18:20   ASSESSMENT AND PLAN:  Toni Griffith  is a 79 y.o. female, with history of squamous cell lung cancer, hypertension, GERD, hearing impairment, and arthritis presents to the ER with daughter for burning, swollen lips and mouth.  Daughter is interpreting American sign language.  They both report that patient had started 2 medications 3 days ago.  The first was an Atrovent inhaler, and the second was a new chemo infusion.  Yesterday, patient started to notice swollen burning lips.   #Acute hypoxic respiratory failure -Most likely secondary to edema versus infiltrate versus worsening lung mass -Lasix and cefepime started in ED -Continue cefepime, Procalcitonin 0.58 -will elevated wbc and h/o cancer cont abxs -Continue 4 L nasal cannula--wean down as tolerated(does not wear at home) -assess for home oxygen use -CT chest negative for PE  Progression of disease, with enlarging left hilar mass and right lower lobe pulmonary nodule.Stable multilocular left pleural effusion.Trace right pleural effusion -Oncology to see pt  Sepsis secondary to pneumonia in immunocompromised patient -Recently finished Levaquin outpatient -Worsening findings on chest x-ray appears due  to lung mass--cannot rule out underlying infection -Continue cefepime -BC negative  #A. fib with RVR in the setting of above -Likely secondary to pulmonary disease Echo in the a.m CHA2DS2-VASc 5, Start heparin drip -pt on IV metoprolol--change to oral BB if bp allows -consider cardiology consult  #Oral candidiasis -Start Magic mouthwash with lidocaine  #CHF acute likley diastolic (echo in 2458 EF 55-60%) -Given soft blood pressures (systolic in the 09X) holding off on further diuresis -Monitoring I's and O's -Daily weights -Cardiology consult  #Protein calorie malnutrition 1. Encourage protein shake between meals 2. Dietitian to see pt    DVT Prophylaxis-  Heparin drip- SCDs   AM Labs Ordered, also please review Full Orders  Family Communication: Admission, patients condition and plan of care including tests being ordered have been discussed with the patient and  Daughter who indicate understanding and agree with the plan. Code Status:  Full  Admission status: inpatient     The patient's presenting symptoms include dyspnea The worrisome physical exam findings include Tachypnea, tachycardia, worsening infiltrate vs edema on CXR The initial radiographic and laboratory data are worrisome because of worsening infiltrate vs edema on CXR The chronic co-morbidities include Sq cell carcinoma, HTN,  Procedures:none Family communication :family at bedside Consults :Oncology CODE STATUS: FULL    Dispo: The patient is from: Home              Anticipated d/c is to: Home              Anticipated d/c date is: 2 days  Patient currently is not medically stable to d/c.        TOTAL TIME TAKING CARE OF THIS PATIENT: 35 minutes.  >50% time spent on counselling and coordination of care  Note: This dictation was prepared with Dragon dictation along with smaller phrase technology. Any transcriptional errors that result from this process are  unintentional.  Fritzi Mandes M.D    [facility]   CC: Primary care physician; Francesca Oman, DOPatient ID: [name], female   DOB: 1940-10-23, 79 y.o.   MRN: [id number]

## 2020-01-04 NOTE — Progress Notes (Signed)
ANTICOAGULATION CONSULT NOTE - Initial Consult  Pharmacy Consult for heparin Indication: atrial fibrillation  No Known Allergies  Patient Measurements: Height: 5\' 3"  (160 cm) Weight: 78.5 kg (173 lb 1 oz) IBW/kg (Calculated) : 52.4 Heparin Dosing Weight: 61 kg  Vital Signs: Temp: 98.5 F (36.9 C) (06/06 1148) Temp Source: Oral (06/06 0800) BP: 106/69 (06/06 1400) Pulse Rate: 50 (06/06 1400)  Labs: Recent Labs    01/03/20 1833 01/04/20 0257 01/04/20 0332 01/04/20 1220 01/04/20 1357  HGB 9.8*  --  9.6*  --   --   HCT 29.6*  --  29.3*  --   --   PLT 363  --  361  --   --   APTT  --  >160*  --  >160*  --   LABPROT  --  18.4*  --   --   --   INR  --  1.6*  --   --   --   HEPARINUNFRC  --   --   --  1.52* <0.10*  CREATININE 0.79  --  0.95  --   --   TROPONINIHS 14 21* 19*  --   --     Estimated Creatinine Clearance: 49.9 mL/min (by C-G formula based on SCr of 0.95 mg/dL).   Medical History: Past Medical History:  Diagnosis Date  . Arthritis   . Cancer [facility])    lymphoma-right hip  . Deaf   . GERD (gastroesophageal reflux disease)   . Hypertension    NOT ON MED AT PRESENT  . Squamous cell lung cancer (HCC)     Medications:  Scheduled:  . albuterol  2.5 mg Nebulization Q6H  . aspirin EC  81 mg Oral Daily  . atorvastatin  80 mg Oral QPM  . Chlorhexidine Gluconate Cloth  6 each Topical Daily  . cholecalciferol  1,000 Units Oral Daily  . DULoxetine  60 mg Oral Daily  . famotidine  20 mg Oral BID  . feeding supplement (ENSURE ENLIVE)  237 mL Oral BID  . magic mouthwash  15 mL Oral TID  . mouth rinse  15 mL Mouth Rinse BID  . metoprolol tartrate  5 mg Intravenous Q6H  . multivitamin with minerals  1 tablet Oral Daily  . omega-3 acid ethyl esters  1 g Oral Daily  . pantoprazole  40 mg Oral Daily  . polyethylene glycol  17 g Oral BID    Assessment: Patient arrives x c/o burning/swollen lips w/ h/o squam cell lung CA s/p chemo tx, GERD, COPD w/ pending sleep  study, deafness, arrives to ED in afib/aflutter w/ RVR on EKG, troponin WNL, CBC WNL for patient's clinical scenario, but also has elevated WBC w/ left shift and increased vascular congestion on CXR. Baseline aPTT/INR pending, no anticoagulation PTA. Patient is being started on heparin drip for management of new onset afib w/ a CHADS-VASc score of 4 (HTN, age > 8, female).   06/06 @ 0300 aPTT > 160 seconds (this was the baseline draw after heparin started, but was drawn appropriately per RN stopping the heparin and flushing the line). However, since hgb dropped a little will hold drip for now and restart at 0500 at a rate of 700 units/hr   Goal of Therapy:  Heparin level 0.3-0.7 units/ml Monitor platelets by anticoagulation protocol: Yes   Plan:  6/6 @ 1220 HL: 1.52. Level is supratherapeutic. Lab reported as a Therapist, sports draw. Spoke with RN about holding heparin infusion for now. Lab contacted for heparin  level redraw.   6/6 Redraw @ 1357 <0.10. Restarting heparin infusion @ 0700 units/hr. Will redraw HL in 8 hours.    Will continue to monitor daily CBC's and adjust per anti-Xa levels and f/u on baseline coags.  Pernell Dupre, PharmD, BCPS Clinical Pharmacist 01/04/2020 2:50 PM

## 2020-01-04 NOTE — Progress Notes (Signed)
ANTICOAGULATION CONSULT NOTE - Initial Consult  Pharmacy Consult for heparin Indication: atrial fibrillation  No Known Allergies  Patient Measurements: Height: 5\' 3"  (160 cm) Weight: 78.5 kg (173 lb 1 oz) IBW/kg (Calculated) : 52.4 Heparin Dosing Weight: 61 kg  Vital Signs: Temp: 98.7 F (37.1 C) (06/06 0200) Temp Source: Axillary (06/06 0200) BP: 108/59 (06/06 0200) Pulse Rate: 94 (06/06 0200)  Labs: Recent Labs    01/01/20 0916 01/03/20 1833 01/04/20 0257  HGB 10.5* 9.8*  --   HCT 33.1* 29.6*  --   PLT 400 363  --   APTT  --   --  >160*  LABPROT  --   --  18.4*  INR  --   --  1.6*  CREATININE 0.85 0.79  --   TROPONINIHS  --  14  --     Estimated Creatinine Clearance: 59.3 mL/min (by C-G formula based on SCr of 0.79 mg/dL).   Medical History: Past Medical History:  Diagnosis Date   Arthritis    Cancer ([place])    lymphoma-right hip   Deaf    GERD (gastroesophageal reflux disease)    Hypertension    NOT ON MED AT PRESENT   Squamous cell lung cancer (HCC)     Medications:  Scheduled:   albuterol  2.5 mg Nebulization Q6H   aspirin EC  81 mg Oral Daily   atorvastatin  80 mg Oral QPM   Chlorhexidine Gluconate Cloth  6 each Topical Daily   cholecalciferol  1,000 Units Oral Daily   DULoxetine  60 mg Oral Daily   famotidine  20 mg Oral BID   feeding supplement (ENSURE ENLIVE)  237 mL Oral BID   magic mouthwash  15 mL Oral TID   mouth rinse  15 mL Mouth Rinse BID   metoprolol tartrate  5 mg Intravenous Q6H   multivitamin with minerals  1 tablet Oral Daily   omega-3 acid ethyl esters  1 g Oral Daily   pantoprazole  40 mg Oral Daily   polyethylene glycol  17 g Oral BID    Assessment: Patient arrives x c/o burning/swollen lips w/ h/o squam cell lung CA s/p chemo tx, GERD, COPD w/ pending sleep study, deafness, arrives to ED in afib/aflutter w/ RVR on EKG, troponin WNL, CBC WNL for patient's clinical scenario, but also has elevated WBC w/  left shift and increased vascular congestion on CXR. Baseline aPTT/INR pending, no anticoagulation PTA. Patient is being started on heparin drip for management of new onset afib w/ a CHADS-VASc score of 4 (HTN, age > 4, female).   Goal of Therapy:  Heparin level 0.3-0.7 units/ml Monitor platelets by anticoagulation protocol: Yes   Plan:  06/06 @ 0300 aPTT > 160 seconds (this was the baseline draw after heparin started, but was drawn appropriately per RN stopping the heparin and flushing the line). However, since hgb dropped a little will hold drip for now and restart at 0500 at a rate of 700 units/hr and will recheck aPTT at 1100 along with heparin level to assess patient's coagulation status.  Tobie Lords, PharmD, BCPS Clinical Pharmacist 01/04/2020,3:45 AM

## 2020-01-04 NOTE — Progress Notes (Signed)
ANTICOAGULATION CONSULT NOTE - Initial Consult  Pharmacy Consult for heparin Indication: atrial fibrillation  No Known Allergies  Patient Measurements: Height: 5\' 3"  (160 cm) Weight: 78.5 kg (173 lb 1 oz) IBW/kg (Calculated) : 52.4 Heparin Dosing Weight: 61 kg  Vital Signs: Temp: 98 F (36.7 C) (06/06 0800) Temp Source: Oral (06/06 0800) BP: 141/103 (06/06 0800) Pulse Rate: 106 (06/06 0800)  Labs: Recent Labs    01/03/20 1833 01/04/20 0257 01/04/20 0332 01/04/20 1220  HGB 9.8*  --  9.6*  --   HCT 29.6*  --  29.3*  --   PLT 363  --  361  --   APTT  --  >160*  --  >160*  LABPROT  --  18.4*  --   --   INR  --  1.6*  --   --   HEPARINUNFRC  --   --   --  1.52*  CREATININE 0.79  --  0.95  --   TROPONINIHS 14 21* 19*  --     Estimated Creatinine Clearance: 49.9 mL/min (by C-G formula based on SCr of 0.95 mg/dL).   Medical History: Past Medical History:  Diagnosis Date  . Arthritis   . Cancer [facility])    lymphoma-right hip  . Deaf   . GERD (gastroesophageal reflux disease)   . Hypertension    NOT ON MED AT PRESENT  . Squamous cell lung cancer (HCC)     Medications:  Scheduled:  . albuterol  2.5 mg Nebulization Q6H  . aspirin EC  81 mg Oral Daily  . atorvastatin  80 mg Oral QPM  . Chlorhexidine Gluconate Cloth  6 each Topical Daily  . cholecalciferol  1,000 Units Oral Daily  . DULoxetine  60 mg Oral Daily  . famotidine  20 mg Oral BID  . feeding supplement (ENSURE ENLIVE)  237 mL Oral BID  . magic mouthwash  15 mL Oral TID  . mouth rinse  15 mL Mouth Rinse BID  . metoprolol tartrate  5 mg Intravenous Q6H  . multivitamin with minerals  1 tablet Oral Daily  . omega-3 acid ethyl esters  1 g Oral Daily  . pantoprazole  40 mg Oral Daily  . polyethylene glycol  17 g Oral BID    Assessment: Patient arrives x c/o burning/swollen lips w/ h/o squam cell lung CA s/p chemo tx, GERD, COPD w/ pending sleep study, deafness, arrives to ED in afib/aflutter w/ RVR on EKG,  troponin WNL, CBC WNL for patient's clinical scenario, but also has elevated WBC w/ left shift and increased vascular congestion on CXR. Baseline aPTT/INR pending, no anticoagulation PTA. Patient is being started on heparin drip for management of new onset afib w/ a CHADS-VASc score of 4 (HTN, age > 69, female).   06/06 @ 0300 aPTT > 160 seconds (this was the baseline draw after heparin started, but was drawn appropriately per RN stopping the heparin and flushing the line). However, since hgb dropped a little will hold drip for now and restart at 0500 at a rate of 700 units/hr   Goal of Therapy:  Heparin level 0.3-0.7 units/ml Monitor platelets by anticoagulation protocol: Yes   Plan:  6/6 @ 1220 HL: 1.52. Level is supratherapeutic. Lab reported as a Therapist, sports draw. Spoke with RN about holding heparin infusion for now. Lab contacted for heparin level redraw.   Will continue to monitor daily CBC's and adjust per anti-Xa levels and f/u on baseline coags.  Pernell Dupre, PharmD, BCPS Clinical  Pharmacist 01/04/2020 1:46 PM

## 2020-01-05 ENCOUNTER — Other Ambulatory Visit: Payer: Self-pay

## 2020-01-05 DIAGNOSIS — C3432 Malignant neoplasm of lower lobe, left bronchus or lung: Secondary | ICD-10-CM

## 2020-01-05 DIAGNOSIS — J9601 Acute respiratory failure with hypoxia: Secondary | ICD-10-CM

## 2020-01-05 DIAGNOSIS — Z515 Encounter for palliative care: Secondary | ICD-10-CM

## 2020-01-05 DIAGNOSIS — C3411 Malignant neoplasm of upper lobe, right bronchus or lung: Secondary | ICD-10-CM

## 2020-01-05 DIAGNOSIS — A4189 Other specified sepsis: Secondary | ICD-10-CM

## 2020-01-05 DIAGNOSIS — I4891 Unspecified atrial fibrillation: Secondary | ICD-10-CM

## 2020-01-05 DIAGNOSIS — C8170 Other classical Hodgkin lymphoma, unspecified site: Secondary | ICD-10-CM

## 2020-01-05 LAB — LEGIONELLA PNEUMOPHILA SEROGP 1 UR AG: L. pneumophila Serogp 1 Ur Ag: NEGATIVE

## 2020-01-05 LAB — ECHOCARDIOGRAM COMPLETE
Height: 63 in
Weight: 2768.98 oz

## 2020-01-05 LAB — TROPONIN I (HIGH SENSITIVITY)
Troponin I (High Sensitivity): 14 ng/L (ref <18)
Troponin I (High Sensitivity): 13 ng/L (ref <18)

## 2020-01-05 LAB — HEPARIN LEVEL (UNFRACTIONATED)
Heparin Unfractionated: 0.14 IU/mL — ABNORMAL LOW (ref 0.30–0.70)
Heparin Unfractionated: 0.25 IU/mL — ABNORMAL LOW (ref 0.30–0.70)

## 2020-01-05 LAB — URINE CULTURE: Culture: NO GROWTH

## 2020-01-05 MED ORDER — HEPARIN BOLUS VIA INFUSION
2000.0000 [IU] | Freq: Once | INTRAVENOUS | Status: AC
  Administered 2020-01-05: 2000 [IU] via INTRAVENOUS
  Filled 2020-01-05: qty 2000

## 2020-01-05 MED ORDER — HEPARIN BOLUS VIA INFUSION
2000.0000 [IU] | Freq: Once | INTRAVENOUS | Status: DC
  Filled 2020-01-05: qty 2000

## 2020-01-05 MED ORDER — ALUM & MAG HYDROXIDE-SIMETH 200-200-20 MG/5ML PO SUSP
30.0000 mL | ORAL | Status: DC | PRN
  Administered 2020-01-05: 30 mL via ORAL
  Filled 2020-01-05: qty 30

## 2020-01-05 MED ORDER — METOPROLOL TARTRATE 50 MG PO TABS
50.0000 mg | ORAL_TABLET | Freq: Two times a day (BID) | ORAL | Status: DC
  Administered 2020-01-05 – 2020-01-09 (×8): 50 mg via ORAL
  Filled 2020-01-05 (×8): qty 1

## 2020-01-05 MED ORDER — CEFDINIR 300 MG PO CAPS
300.0000 mg | ORAL_CAPSULE | Freq: Two times a day (BID) | ORAL | Status: DC
  Administered 2020-01-05 – 2020-01-09 (×8): 300 mg via ORAL
  Filled 2020-01-05 (×9): qty 1

## 2020-01-05 MED ORDER — METOPROLOL TARTRATE 25 MG PO TABS: 25.0000 mg | ORAL_TABLET | Freq: Two times a day (BID) | ORAL | Status: DC

## 2020-01-05 NOTE — Progress Notes (Addendum)
ANTICOAGULATION CONSULT NOTE - Initial Consult  Pharmacy Consult for heparin Indication: atrial fibrillation  No Known Allergies  Patient Measurements: Height: 5\' 3"  (160 cm) Weight: 78.1 kg (172 lb 2.9 oz) IBW/kg (Calculated) : 52.4 Heparin Dosing Weight: 61 kg  Vital Signs: Temp: 97.8 F (36.6 C) (06/07 0800) Temp Source: Axillary (06/07 0800) BP: 127/77 (06/07 0800) Pulse Rate: 98 (06/07 0800)  Labs: Recent Labs    01/03/20 1833 01/04/20 0257 01/04/20 0332 01/04/20 1220 01/04/20 1220 01/04/20 1357 01/04/20 2149 01/05/20 0819  HGB 9.8*  --  9.6*  --   --   --   --   --   HCT 29.6*  --  29.3*  --   --   --   --   --   PLT 363  --  361  --   --   --   --   --   APTT  --  >160*  --  >160*  --   --   --   --   LABPROT  --  18.4*  --   --   --   --   --   --   INR  --  1.6*  --   --   --   --   --   --   HEPARINUNFRC  --   --   --  1.52*   < > <0.10* <0.10* 0.14*  CREATININE 0.79  --  0.95  --   --   --   --   --   TROPONINIHS 14 21* 19*  --   --   --   --   --    < > = values in this interval not displayed.    Estimated Creatinine Clearance: 49.9 mL/min (by C-G formula based on SCr of 0.95 mg/dL).   Medical History: Past Medical History:  Diagnosis Date  . Arthritis   . Cancer [facility])    lymphoma-right hip  . Deaf   . GERD (gastroesophageal reflux disease)   . Hypertension    NOT ON MED AT PRESENT  . Squamous cell lung cancer (HCC)     Medications:  Scheduled:  . albuterol  2.5 mg Nebulization Q6H  . aspirin EC  81 mg Oral Daily  . atorvastatin  80 mg Oral QPM  . Chlorhexidine Gluconate Cloth  6 each Topical Daily  . cholecalciferol  1,000 Units Oral Daily  . clotrimazole   Topical BID  . DULoxetine  60 mg Oral Daily  . famotidine  20 mg Oral BID  . feeding supplement (ENSURE ENLIVE)  237 mL Oral BID  . magic mouthwash  15 mL Oral TID  . mouth rinse  15 mL Mouth Rinse BID  . metoprolol tartrate  5 mg Intravenous Q6H  . multivitamin with minerals  1  tablet Oral Daily  . omega-3 acid ethyl esters  1 g Oral Daily  . pantoprazole  40 mg Oral Daily  . polyethylene glycol  17 g Oral BID    Assessment: Patient arrives x c/o burning/swollen lips w/ h/o squam cell lung CA s/p chemo tx, GERD, COPD w/ pending sleep study, deafness, arrives to ED in afib/aflutter w/ RVR on EKG, troponin WNL, CBC WNL for patient's clinical scenario, but also has elevated WBC w/ left shift and increased vascular congestion on CXR. Baseline aPTT/INR pending, no anticoagulation PTA. Patient is being started on heparin drip for management of new onset afib w/ a CHADS-VASc score of 4 (HTN,  age > 105, female).   Goal of Therapy:  Heparin level 0.3-0.7 units/ml Monitor platelets by anticoagulation protocol: Yes   Plan:  06/07 @ 0819 HL 0.14 subtherapeutic. Will bolus heparin 2000 units IV x 1 and increase rate to 1000 units/hr and will recheck HL 1700 -CBC low stable will continue to monitor.  [place] Resident 01/05/2020 9:00 AM

## 2020-01-05 NOTE — Progress Notes (Signed)
NP Sharion Settler made aware that pt complaining of chest tightness, new, hasnt had this before, VSS, HR 102, ordered STAT troponin and EKG, states that she will come to unit soon, family at bedside

## 2020-01-05 NOTE — Consult Note (Signed)
[facility] Cardiology Consultation Note  Patient ID: Toni Griffith, MRN: [id number], DOB/AGE: 1941-06-24 79 y.o. Admit date: 01/03/2020   Date of Consult: 01/05/2020 Primary Physician: Francesca Oman, DO Primary Cardiologist: None  Chief Complaint:  Chief Complaint  Patient presents with  . Oral Swelling   Reason for Consult: Atrial flutter  HPI: 79 y.o. female with known lung cancer status post previous coronary bypass grafting peripheral vascular disease hypertension hyperlipidemia for which the patient has had an exacerbation of hypoxia shortness of breath.  This hypoxia and shortness of breath is significant at this time and the patient has had additional issues atrial flutter with rapid ventricular rate.  Her atrial flutter is 120 250 bpm and has been concerning.  She was placed on metoprolol and her heart rate now is 110 bpm much more controlled.  The patient has had no evidence of congestive heart failure or anginal symptoms at this time.  The patient is receiving oxygenation at this time  Past Medical History:  Diagnosis Date  . Arthritis   . Cancer [facility])    lymphoma-right hip  . Deaf   . GERD (gastroesophageal reflux disease)   . Hypertension    NOT ON MED AT PRESENT  . 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 CARDIAC SURGERY  1993  . CORONARY ARTERY BYPASS GRAFT     DOUBLE  . CYST REMOVAL HAND Right   . ENDOBRONCHIAL ULTRASOUND N/A 05/30/2018   Procedure: ENDOBRONCHIAL ULTRASOUND;  Surgeon: Tyler Pita, MD;  Location: [facility] ORS;  Service: Cardiopulmonary;  Laterality: N/A;  . PERIPHERAL VASCULAR CATHETERIZATION N/A 04/06/2016   Procedure: Glori Luis Cath Insertion;  Surgeon: Algernon Huxley, MD;  Location: [facility];  Service: Cardiovascular;  Laterality: N/A;     Home Meds: Prior to Admission medications   Medication Sig Start Date End Date Taking? Authorizing Provider   allopurinol (ZYLOPRIM) 300 MG tablet TAKE 1 TABLET BY MOUTH EVERY DAY Patient taking differently: Take 300 mg by mouth daily.  10/17/19  Yes Lequita Asal, MD  aspirin EC 81 MG tablet Take 81 mg by mouth daily.   Yes [provider]  atorvastatin (LIPITOR) 80 MG tablet Take 80 mg by mouth every evening.  10/14/15 01/03/20 Yes [provider]  b complex vitamins tablet Take 1 tablet by mouth daily.   Yes [provider]  Carbonyl Iron (IRON CHEWS PEDIATRIC PO) Take 5 mg by mouth daily.   Yes [provider]  Cholecalciferol (VITAMIN D3) 2000 units capsule Take 1,000 Units by mouth daily.    Yes [provider]  diclofenac Sodium (VOLTAREN) 1 % GEL Apply topically 4 (four) times daily.   Yes [provider]  DULoxetine (CYMBALTA) 60 MG capsule Take 60 mg by mouth daily.  09/01/19  Yes [provider]  levofloxacin (LEVAQUIN) 500 MG tablet Take 1 tablet (500 mg total) by mouth daily. 12/25/19  Yes Corcoran, Drue Second, MD  Omega-3 Fatty Acids (FISH OIL PO) Take 1 tablet by mouth daily.   Yes [provider]  ondansetron (ZOFRAN ODT) 4 MG disintegrating tablet Take 1 tablet (4 mg total) by mouth every 8 (eight) hours as needed for nausea or vomiting. 06/11/19  Yes Corcoran, Drue Second, MD  pantoprazole (PROTONIX) 40 MG tablet Take 1 tablet (40 mg total) by mouth daily. 12/31/19 12/30/20 Yes Tyler Pita, MD  vitamin E  400 UNIT capsule Take 400 Units by mouth daily.    Yes [provider]  acetaminophen (TYLENOL) 500 MG tablet Take 500 mg by mouth every 6 (six) hours as needed for moderate pain.     [provider]  dextromethorphan 15 MG/5ML syrup Take 10 mLs by mouth 4 (four) times daily as needed for cough.    [provider]  HYDROcodone-acetaminophen (NORCO) 5-325 MG tablet Take 1/2 - 1 tablet by mouth every 6 hours as needed for severe pain. 12/17/19   Lequita Asal, MD  ipratropium-albuterol  (DUONEB) 0.5-2.5 (3) MG/3ML SOLN Take 3 mLs by nebulization every 4 (four) hours as needed. 12/31/19   Tyler Pita, MD  lidocaine-prilocaine (EMLA) cream Apply cream 1 hour before chemotherapy treatment and place small peive of saran wrap over cream to protect clothing 06/11/19   Lequita Asal, MD    Inpatient Medications:  . albuterol  2.5 mg Nebulization Q6H  . aspirin EC  81 mg Oral Daily  . atorvastatin  80 mg Oral QPM  . cefdinir  300 mg Oral Q12H  . Chlorhexidine Gluconate Cloth  6 each Topical Daily  . cholecalciferol  1,000 Units Oral Daily  . clotrimazole   Topical BID  . DULoxetine  60 mg Oral Daily  . famotidine  20 mg Oral BID  . feeding supplement (ENSURE ENLIVE)  237 mL Oral BID  . magic mouthwash  15 mL Oral TID  . mouth rinse  15 mL Mouth Rinse BID  . metoprolol tartrate  25 mg Oral BID  . multivitamin with minerals  1 tablet Oral Daily  . omega-3 acid ethyl esters  1 g Oral Daily  . pantoprazole  40 mg Oral Daily  . polyethylene glycol  17 g Oral BID   . heparin 1,000 Units/hr (01/05/20 1300)    Allergies: No Known Allergies  Social History   Socioeconomic History  . Marital status: Single    Spouse name: Not on file  . Number of children: Not on file  . Years of education: Not on file  . Highest education level: Not on file  Occupational History  . Not on file  Tobacco Use  . Smoking status: Former Smoker    Packs/day: 2.00    Years: 40.00    Pack years: 80.00    Types: Cigarettes    Quit date: 11/21/2015    Years since quitting: 4.1  . Smokeless tobacco: Never Used  Substance and Sexual Activity  . Alcohol use: Yes    Comment: rare  . Drug use: No  . Sexual activity: Never  Other Topics Concern  . Not on file  Social History Narrative  . Not on file   Social Determinants of Health   Financial Resource Strain:   . Difficulty of Paying Living Expenses:   Food Insecurity:   . Worried About Charity fundraiser in the Last Year:    . Arboriculturist in the Last Year:   Transportation Needs:   . Film/video editor (Medical):   Marland Kitchen Lack of Transportation (Non-Medical):   Physical Activity:   . Days of Exercise per Week:   . Minutes of Exercise per Session:   Stress:   . Feeling of Stress :   Social Connections:   . Frequency of Communication with Friends and Family:   . Frequency of Social Gatherings with Friends and Family:   . Attends Religious Services:   . Active Member of Clubs  or Organizations:   . Attends Archivist Meetings:   Marland Kitchen Marital Status:   Intimate Partner Violence:   . Fear of Current or Ex-Partner:   . Emotionally Abused:   Marland Kitchen Physically Abused:   . Sexually Abused:      Family History  Problem Relation Age of Onset  . Lung cancer Mother   . Lung cancer Father   . Diabetes Maternal Uncle   . Myasthenia gravis Maternal Grandmother   . Leukemia Maternal Grandfather      Review of Systems Positive for shortness of breath Negative for: General:  chills, fever, night sweats or weight changes.  Cardiovascular: PND orthopnea syncope dizziness  Dermatological skin lesions rashes Respiratory: Cough congestion Urologic: Frequent urination urination at night and hematuria Abdominal: negative for nausea, vomiting, diarrhea, bright red blood per rectum, melena, or hematemesis Neurologic: negative for visual changes, and/or hearing changes  All other systems reviewed and are otherwise negative except as noted above.  Labs: No results for input(s): CKTOTAL, CKMB, TROPONINI in the last 72 hours. Lab Results  Component Value Date   WBC 21.9 (H) 01/04/2020   HGB 9.6 (L) 01/04/2020   HCT 29.3 (L) 01/04/2020   MCV 98.0 01/04/2020   PLT 361 01/04/2020    Recent Labs  Lab 01/04/20 0332  NA 132*  K 4.5  CL 99  CO2 27  BUN 20  CREATININE 0.95  CALCIUM 9.2  PROT 6.4*  BILITOT 1.6*  ALKPHOS 78  ALT 14  AST 21  GLUCOSE 131*   Lab Results  Component Value Date   CHOL 114  06/11/2019   HDL 36 (L) 06/11/2019   LDLCALC 60 06/11/2019   TRIG 92 06/11/2019   No results found for: DDIMER  Radiology/Studies:  DG Chest 2 View  Result Date: 12/25/2019 CLINICAL DATA:  79 year old female with history of left-sided pleural effusion. EXAM: CHEST - 2 VIEW COMPARISON:  Chest x-ray 12/05/2019. FINDINGS: Mass-like opacity in the left mid lung again noted. Ill-defined opacities throughout the left mid to lower lung. Trace left pleural effusion. Linear area of architectural distortion in the right mid lung. Small pulmonary nodule measuring 11 mm in the lateral aspect of the right mid lung, similar to the prior examination. No evidence of pulmonary edema. Cardiac silhouette is largely obscured. Aortic atherosclerosis. Status post median sternotomy for CABG. IMPRESSION: 1. The appearance of the chest is very similar to the prior study 12/05/2019 with chronic changes of known lung cancer and postradiation changes, as detailed above. 2. Trace left pleural effusion. 3. Aortic atherosclerosis. Electronically Signed   By: Vinnie Langton M.D.   On: 12/25/2019 10:51   CT ANGIO CHEST PE W OR WO CONTRAST  Result Date: 01/03/2020 CLINICAL DATA:  Shortness of breath, oral swelling, history of squamous cell lung cancer and lymphoma EXAM: CT ANGIOGRAPHY CHEST WITH CONTRAST TECHNIQUE: Multidetector CT imaging of the chest was performed using the standard protocol during bolus administration of intravenous contrast. Multiplanar CT image reconstructions and MIPs were obtained to evaluate the vascular anatomy. CONTRAST:  4mL OMNIPAQUE IOHEXOL 350 MG/ML SOLN COMPARISON:  12/02/2019, 01/03/2020 FINDINGS: Cardiovascular: This is a technically adequate evaluation of the pulmonary vasculature. There are no filling defects or pulmonary emboli. Stable atherosclerosis of the thoracic aorta and coronary vessels. Mediastinum/Nodes: Stable appearance of the thyroid, esophagus, and trachea. No pathologic mediastinal  adenopathy. Lungs/Pleura: There is progressive left perihilar consolidation, consistent with progression of disease. Multilocular left pleural effusion is again noted, not appreciably changed in size.  There is a new trace right pleural effusion. Increasing right lower lobe pulmonary nodule now measures 13 mm reference image 32, compatible with progression of disease. Upper Abdomen: Stable left lobe hepatic cyst. 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 Reconstructed images demonstrate no additional findings. Review of the MIP images confirms the above findings. IMPRESSION: 1. Progression of disease, with enlarging left hilar mass and right lower lobe pulmonary nodule. 2. Stable multilocular left pleural effusion. 3. Trace right pleural effusion. Electronically Signed   By: Randa Ngo M.D.   On: 01/03/2020 22:31   DG Chest Left Decubitus  Result Date: 12/25/2019 CLINICAL DATA:  Assess left pleural effusion. History of left lung cancer. EXAM: CHEST - LEFT DECUBITUS COMPARISON:  Earlier same day and 12/05/2019, chest CT 12/02/2019 FINDINGS: Right lung demonstrates mild linear density over the mid lung likely atelectasis with stable right midlung nodule. Moderate stable opacification over the left midlung and left base. No definite free flowing left pleural effusion. Remainder of the exam is unchanged. IMPRESSION: Stable left midlung and left basilar opacification. No definite evidence of free-flowing left pleural effusion. Linear density right midlung unchanged likely atelectasis in stable right midlung nodule. Electronically Signed   By: Marin Olp M.D.   On: 12/25/2019 10:51   DG Chest Portable 1 View  Result Date: 01/03/2020 CLINICAL DATA:  Short of breath, oral swelling, history of squamous cell lung cancer and lymphoma EXAM: PORTABLE CHEST 1 VIEW COMPARISON:  12/25/2019 FINDINGS: Single frontal view of the chest  demonstrates right chest wall port unchanged. Cardiac silhouette is stable. There is increasing consolidation and effusion within the left hemithorax. Stable nodularity within the right midlung zone. Small amount of fluid within the minor fissure. Increased vascular congestion since prior study.  No pneumothorax. IMPRESSION: 1. Worsening left lung consolidation and effusion. 2. Stable nodularity within the right midlung zone. 3. Increased vascular congestion since prior study. Electronically Signed   By: Randa Ngo M.D.   On: 01/03/2020 18:20   ECHOCARDIOGRAM COMPLETE  Result Date: 01/05/2020    ECHOCARDIOGRAM REPORT   Patient Name:   ZARINAH OVIATT WYOVZ Date of Exam: 01/04/2020 Medical Rec #:  [id number]            Height:       63.0 in Accession #:    [id number]           Weight:       173.1 lb Date of Birth:  1941-04-20            BSA:          1.818 m Patient Age:    20 years             BP:           127/76 mmHg Patient Gender: F                    HR:           100 bpm. Exam Location:  [facility] Procedure: 2D Echo Indications:     Atrial Fibrillation  History:         Patient has prior history of Echocardiogram examinations. Risk                  Factors:Hypertension.  Sonographer:     L Thornton-Maynard Referring Phys:  [id number] [place] Diagnosing Phys: Kathlyn Sacramento MD IMPRESSIONS  1. Left ventricular ejection fraction, by estimation, is 60 to  65%. The left ventricle has normal function. The left ventricle has no regional wall motion abnormalities. There is mild left ventricular hypertrophy. Left ventricular diastolic parameters are indeterminate.  2. Right ventricular systolic function is normal. The right ventricular size is normal. There is moderately elevated pulmonary artery systolic pressure. The estimated right ventricular systolic pressure is 76.2 mmHg.  3. The mitral valve is normal in structure. Mild mitral valve regurgitation. No evidence of mitral stenosis.  4. Tricuspid valve  regurgitation is mild to moderate.  5. The aortic valve is normal in structure. Aortic valve regurgitation is not visualized. Mild aortic valve sclerosis is present, with no evidence of aortic valve stenosis.  6. Mildly dilated pulmonary artery.  7. The inferior vena cava is normal in size with greater than 50% respiratory variability, suggesting right atrial pressure of 3 mmHg. FINDINGS  Left Ventricle: Left ventricular ejection fraction, by estimation, is 60 to 65%. The left ventricle has normal function. The left ventricle has no regional wall motion abnormalities. The left ventricular internal cavity size was normal in size. There is  mild left ventricular hypertrophy. Left ventricular diastolic parameters are indeterminate. Right Ventricle: The right ventricular size is normal. No increase in right ventricular wall thickness. Right ventricular systolic function is normal. There is moderately elevated pulmonary artery systolic pressure. The tricuspid regurgitant velocity is 3.49 m/s, and with an assumed right atrial pressure of 5 mmHg, the estimated right ventricular systolic pressure is 83.1 mmHg. Left Atrium: Left atrial size was normal in size. Right Atrium: Right atrial size was normal in size. Pericardium: There is no evidence of pericardial effusion. Mitral Valve: The mitral valve is normal in structure. Normal mobility of the mitral valve leaflets. Mild mitral valve regurgitation. No evidence of mitral valve stenosis. MV peak gradient, 4.5 mmHg. The mean mitral valve gradient is 2.0 mmHg. Tricuspid Valve: The tricuspid valve is normal in structure. Tricuspid valve regurgitation is mild to moderate. No evidence of tricuspid stenosis. Aortic Valve: The aortic valve is normal in structure. Aortic valve regurgitation is not visualized. Mild aortic valve sclerosis is present, with no evidence of aortic valve stenosis. Aortic valve mean gradient measures 8.0 mmHg. Aortic valve peak gradient measures 12.5 mmHg.  Aortic valve area, by VTI measures 1.65 cm. Pulmonic Valve: The pulmonic valve was normal in structure. Pulmonic valve regurgitation is mild. No evidence of pulmonic stenosis. Aorta: The aortic root is normal in size and structure. Pulmonary Artery: The pulmonary artery is mildly dilated. Venous: The inferior vena cava is normal in size with greater than 50% respiratory variability, suggesting right atrial pressure of 3 mmHg. IAS/Shunts: No atrial level shunt detected by color flow Doppler.  LEFT VENTRICLE PLAX 2D LVIDd:         4.39 cm  Diastology LVIDs:         2.58 cm  LV e' lateral:   7.72 cm/s LV PW:         1.16 cm  LV E/e' lateral: 6.0 LV IVS:        1.24 cm  LV e' medial:    7.40 cm/s LVOT diam:     2.20 cm  LV E/e' medial:  6.3 LV SV:         48 LV SV Index:   26 LVOT Area:     3.80 cm  RIGHT VENTRICLE RV S prime:     12.60 cm/s TAPSE (M-mode): 1.8 cm LEFT ATRIUM  Index LA diam:        3.70 cm 2.03 cm/m LA Vol (A2C):   62.9 ml 34.59 ml/m LA Vol (A4C):   48.8 ml 26.84 ml/m LA Biplane Vol: 59.4 ml 32.67 ml/m  AORTIC VALVE                    PULMONIC VALVE AV Area (Vmax):    1.77 cm     PV Vmax:       1.19 m/s AV Area (Vmean):   1.61 cm     PV Peak grad:  5.7 mmHg AV Area (VTI):     1.65 cm AV Vmax:           177.00 cm/s AV Vmean:          137.000 cm/s AV VTI:            0.291 m AV Peak Grad:      12.5 mmHg AV Mean Grad:      8.0 mmHg LVOT Vmax:         82.20 cm/s LVOT Vmean:        58.200 cm/s LVOT VTI:          0.126 m LVOT/AV VTI ratio: 0.43  AORTA Ao Root diam: 3.40 cm MITRAL VALVE               TRICUSPID VALVE MV Peak grad: 4.5 mmHg     TR Peak grad:   48.7 mmHg MV Mean grad: 2.0 mmHg     TR Vmax:        349.00 cm/s MV Vmax:      1.06 m/s MV Vmean:     72.6 cm/s    SHUNTS MV E velocity: 46.70 cm/s  Systemic VTI:  0.13 m MV A velocity: 76.70 cm/s  Systemic Diam: 2.20 cm MV E/A ratio:  0.61 Kathlyn Sacramento MD Electronically signed by Kathlyn Sacramento MD Signature Date/Time: 01/05/2020/9:10:20  AM    Final     EKG: Atrial flutter with rapid ventricular rate nonspecific ST changes  Weights: Filed Weights   01/04/20 0200 01/05/20 0356  Weight: 78.5 kg 78.1 kg     Physical Exam: Blood pressure 109/74, pulse (!) 110, temperature 98 F (36.7 C), temperature source Axillary, resp. rate (!) 28, height 5\' 3"  (1.6 m), weight 78.1 kg, SpO2 98 %. Body mass index is 30.5 kg/m. General: Well developed, well nourished, in no acute distress. Head eyes ears nose throat: Normocephalic, atraumatic, sclera non-icteric, no xanthomas, nares are without discharge. No apparent thyromegaly and/or mass  Lungs: Normal respiratory effort.  Diffuse wheezes, no rales, no rhonchi.  Heart: Irregular with normal S1 S2. no murmur gallop, no rub, PMI is normal size and placement, carotid upstroke normal without bruit, jugular venous pressure is normal Abdomen: Soft, non-tender, non-distended with normoactive bowel sounds. No hepatomegaly. No rebound/guarding. No obvious abdominal masses. Abdominal aorta is normal size without bruit Extremities: Trace edema. no cyanosis, no clubbing, no ulcers  Peripheral : 2+ bilateral upper extremity pulses, 2+ bilateral femoral pulses, 2+ bilateral dorsal pedal pulse Neuro: Alert and oriented. No facial asymmetry. No focal deficit. Moves all extremities spontaneously. Musculoskeletal: Normal muscle tone without kyphosis Psych:  Responds to questions appropriately with a normal affect.    Assessment: 79 year old female with hypertension hyperlipidemia coronary artery disease status post coronary bypass graft with acute shortness of breath consistent with respiratory failure and COPD with wheezing and hypoxia causing atrial flutter with rapid ventricular rate and no current  evidence of myocardial infarction or congestive heart failure  Plan: 1.  Continue metoprolol at current dose for heart rate control and increase potentially to 50 mg twice per day for better heart rate  control and possible spontaneous conversion to normal sinus rhythm 2.  Okay for heparin for anticoagulation or subcutaneously for risk reduction in stroke with atrial fibrillation if able 3.  Echocardiogram for LV systolic dysfunction valvular heart disease contributing to above 4.  Continue treatment of primary cause of issues as above including shortness of breath and palpitations from exacerbation of COPD  Signed, Corey Skains M.D. [facility] 01/05/2020, 5:25 PM

## 2020-01-05 NOTE — Progress Notes (Signed)
Cross Cover Brief Note Nurse calls to report patient with new onset chest pain Patient describes to me anterior chest sternal soreness present for 6 months that improves some with antacid and muscle relaxer but never goes away.  Patient also endorses she is burping and that increases the pain. On exam pain can be ellicited with sternal chest palpation.  Also, patient endorses pain started after pacemaker placement EKG is no sinus rhythm. Lateral infarct cannot be ruled out. Will repeat to compare Troponin Treating now with antacid, PPI, monitor for improvement

## 2020-01-05 NOTE — TOC Initial Note (Addendum)
Transition of Care [facility]) - Initial/Assessment Note    Patient Details  Name: Toni Griffith MRN: [id number] Date of Birth: 03/29/1941  Transition of Care [facility]) CM/SW Contact:    Magnus Ivan, LCSW Phone Number: 01/05/2020, 11:34 AM  Clinical Narrative:        CSW completed Readmission Screen. Patient was asleep in room so CSW called patient's daughter/primary contact, [name]. [name] reported patient lives between [place] with her son (when she is doing cancer treatments) and [place] with her boyfriend. PCP is Dr. Redmond Pulling in [place], [place]. [facility] uses [facility] [place] or [place], depending on where she is at the time. [name] denied issues with obtaining medications. Patient's children provide transport to appointments. Patient has  walker and wheelchair at home. [name] reported patient had Home Health services in the past (she says over a year ago), she could not recall which agency was used. No SNF history per [name]. [name] was encouraged to reach out with any needs. CSW will continue to follow for discharge needs.            Expected Discharge Plan: Home/Self Care Barriers to Discharge: Continued Medical Work up   Patient Goals and CMS Choice   CMS Medicare.gov Compare Post Acute Care list provided to:: Patient Represenative (must comment) Choice offered to / list presented to : Adult Children  Expected Discharge Plan and Services Expected Discharge Plan: Home/Self Care       Living arrangements for the past 2 months: Single Family Home                                      Prior Living Arrangements/Services Living arrangements for the past 2 months: Single Family Home Lives with:: Adult Children, Significant Other Patient language and need for interpreter reviewed:: Yes        Need for Family Participation in Patient Care: Yes (Comment) Care giver support system in place?: Yes (comment) Current home services: DME Criminal Activity/Legal Involvement  Pertinent to Current Situation/Hospitalization: No - Comment as needed  Activities of Daily Living      Permission Sought/Granted                  Emotional Assessment         Alcohol / Substance Use: Not Applicable Psych Involvement: No (comment)  Admission diagnosis:  Pneumonia [J18.9] Community acquired pneumonia of right lung, unspecified part of lung [J18.9] Congestive heart failure, unspecified HF chronicity, unspecified heart failure type ([place]) [I50.9] Patient Active Problem List   Diagnosis Date Noted  . Sepsis with acute hypoxic respiratory failure ([place])   . Lung mass   . Mucositis   . Cheilitis   . Community acquired pneumonia of right lung 01/03/2020  . Congestive heart failure ([place])   . Cancer-related pain 12/21/2019  . Chills 12/10/2019  . Tachycardia 12/10/2019  . Tachypnea 12/10/2019  . Pleural effusion, left 12/03/2019  . Hypomagnesemia 10/18/2019  . Tinnitus of both ears 09/05/2019  . B12 deficiency 08/29/2019  . Arthritis of both knees 04/11/2019  . Primary osteoarthritis of both knees 12/22/2018  . Mediastinal adenopathy   . Encounter for antineoplastic chemotherapy 05/16/2018  . Encounter for antineoplastic immunotherapy 05/16/2018  . Hyponatremia 02/01/2018  . Chronic pain of left knee 12/23/2017  . Chemotherapy-induced peripheral neuropathy ([place]) 11/24/2017  . Pain of both hip joints 11/24/2017  . Encounter for monoclonal antibody treatment  for malignancy 11/23/2017  . Goals of care, counseling/discussion 11/01/2017  . 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 Osteoarthritis of left knee 04/27/2017  . Malignant neoplasm of lung ([place]) 09/05/2016  . Hypokalemia 04/29/2016  . Neuropathy associated with cancer ([place]) 04/13/2016  . Hypercalcemia 03/30/2016  . Anemia 03/30/2016  . Non-Hodgkin's lymphoma ([place]) 03/08/2016  . Hodgkin's lymphoma ([place]) 03/08/2016  . 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 Bone metastasis ([place]) 03/03/2016  . Stage 3 chronic kidney disease 03/01/2016  . Gait abnormality 02/24/2016  . Nontoxic single thyroid nodule 02/21/2016  . History of coronary artery bypass graft x 2 02/20/2016  . Fatigue 02/20/2016  . Hiatal hernia 02/04/2016  . Esophageal dysphagia 12/31/2015  . Restless leg syndrome 12/28/2015  . Obesity 12/28/2015  . Mixed hyperlipidemia 12/28/2015  . GERD without esophagitis 12/28/2015  . Essential hypertension 12/28/2015  . Simple chronic bronchitis ([place]) 10/14/2015  . 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 Congenital deafness 10/14/2015   PCP:  Francesca Oman, DO Pharmacy:   [id number] [place], [address] - [address], [address] AT [facility] [address] [place] [address] Phone: [phone] Fax: [phone]  [id number] [address], [place] [address] Phone: [phone] Fax: [phone]     Social Determinants of Health (SDOH) Interventions    Readmission Risk Interventions Readmission Risk Prevention Plan 01/05/2020  Transportation Screening Complete  PCP or Specialist Appt within 3-5 Days Complete  HRI or Home Care Consult Complete  Medication Review (RN Care Manager) Complete  Some recent data might be hidden

## 2020-01-05 NOTE — Progress Notes (Signed)
Chaplain made 3 unsuccessful attempts to visit patient. Patient was receiving medical care after being moved from ICU and consult with doctor and family members. Chaplain briefly spoke to unknow family who appeared  anxious, asked if Chaplain could come back later. Chaplain recommends follow up visit from AM Chaplain.

## 2020-01-05 NOTE — Progress Notes (Signed)
SHIFT SUMMARY:  Patients daughter, [name] was updated this morning while video calling with the patient on the patients cell phone. -0900  Patients son, [name] at bedside with the patient. -[place] has been placed.  Dr. Posey Pronto updated the patient, her son (at bedside) & her daughter (via [place]) using Interpreter services. All questions were answered.   Pt remains on Heparin gtt at this time.  Pt provided with a clip board and paper to write as needed.  Call bell taped to bedside in order to provide easy access for the patient.  Pt remains in good spirits and does a good job communicating her needs.  Pt has her IPad as well as her personal cell phone at bedside.

## 2020-01-05 NOTE — Progress Notes (Signed)
ANTICOAGULATION CONSULT NOTE - Initial Consult  Pharmacy Consult for heparin Indication: atrial fibrillation  No Known Allergies  Patient Measurements: Height: 5\' 3"  (160 cm) Weight: 78.5 kg (173 lb 1 oz) IBW/kg (Calculated) : 52.4 Heparin Dosing Weight: 61 kg  Vital Signs: Temp: 98 F (36.7 C) (06/06 2012) Temp Source: Oral (06/06 2012) BP: 114/71 (06/06 2200) Pulse Rate: 27 (06/06 2100)  Labs: Recent Labs    01/03/20 1833 01/04/20 0257 01/04/20 0332 01/04/20 1220 01/04/20 1357 01/04/20 2149  HGB 9.8*  --  9.6*  --   --   --   HCT 29.6*  --  29.3*  --   --   --   PLT 363  --  361  --   --   --   APTT  --  >160*  --  >160*  --   --   LABPROT  --  18.4*  --   --   --   --   INR  --  1.6*  --   --   --   --   HEPARINUNFRC  --   --   --  1.52* <0.10* <0.10*  CREATININE 0.79  --  0.95  --   --   --   TROPONINIHS 14 21* 19*  --   --   --     Estimated Creatinine Clearance: 49.9 mL/min (by C-G formula based on SCr of 0.95 mg/dL).   Medical History: Past Medical History:  Diagnosis Date  . Arthritis   . Cancer [facility])    lymphoma-right hip  . Deaf   . GERD (gastroesophageal reflux disease)   . Hypertension    NOT ON MED AT PRESENT  . Squamous cell lung cancer (HCC)     Medications:  Scheduled:  . albuterol  2.5 mg Nebulization Q6H  . aspirin EC  81 mg Oral Daily  . atorvastatin  80 mg Oral QPM  . Chlorhexidine Gluconate Cloth  6 each Topical Daily  . cholecalciferol  1,000 Units Oral Daily  . clotrimazole   Topical BID  . DULoxetine  60 mg Oral Daily  . famotidine  20 mg Oral BID  . feeding supplement (ENSURE ENLIVE)  237 mL Oral BID  . heparin  2,000 Units Intravenous Once  . magic mouthwash  15 mL Oral TID  . mouth rinse  15 mL Mouth Rinse BID  . metoprolol tartrate  5 mg Intravenous Q6H  . multivitamin with minerals  1 tablet Oral Daily  . omega-3 acid ethyl esters  1 g Oral Daily  . pantoprazole  40 mg Oral Daily  . polyethylene glycol  17 g Oral  BID    Assessment: Patient arrives x c/o burning/swollen lips w/ h/o squam cell lung CA s/p chemo tx, GERD, COPD w/ pending sleep study, deafness, arrives to ED in afib/aflutter w/ RVR on EKG, troponin WNL, CBC WNL for patient's clinical scenario, but also has elevated WBC w/ left shift and increased vascular congestion on CXR. Baseline aPTT/INR pending, no anticoagulation PTA. Patient is being started on heparin drip for management of new onset afib w/ a CHADS-VASc score of 4 (HTN, age > 74, female).   06/06 @ 0300 aPTT > 160 seconds (this was the baseline draw after heparin started, but was drawn appropriately per RN stopping the heparin and flushing the line). However, since hgb dropped a little will hold drip for now and restart at 0500 at a rate of 700 units/hr   Goal of Therapy:  Heparin level 0.3-0.7 units/ml Monitor platelets by anticoagulation protocol: Yes   Plan:  06/07 @ 2100 HL < 0.10 subtherapeutic. Will bolus heparin 2000 units IV x 1 and increase rate to 850 units/hr and will recheck HL at 0800, CBC low stable will continue to monitor.  Tobie Lords, PharmD, BCPS Clinical Pharmacist 01/05/2020 12:08 AM

## 2020-01-05 NOTE — Progress Notes (Signed)
[facility] [place] NAME: Toni Griffith    MR#:  [id number]  DATE OF BIRTH:  Jun 26, 1941  SUBJECTIVE:   Sign language interpretation. Son int he room and dter patty on Facetime  Patient came in with swelling and burning of her lower lips. Much improved able to swallow no tongue swelling. Had some shortness of breath.  Currently getting IV heparin drip. Found to be an a fib with RVR. Heart rate fluctuates. Blood pressure is soft. Eating well Denies any new complaints REVIEW OF SYSTEMS:   Review of Systems  Unable to perform ROS: Other  pt nonverbal Tolerating Diet:yes Tolerating PT:   DRUG ALLERGIES:  No Known Allergies  VITALS:  Blood pressure (!) 109/98, pulse (!) 103, temperature 97.8 F (36.6 C), temperature source Axillary, resp. rate 14, height 5\' 3"  (1.6 m), weight 78.1 kg, SpO2 100 %.  PHYSICAL EXAMINATION:   Physical Exam  GENERAL:  79 y.o.-year-old patient lying in the bed with no acute distress.  EYES: Pupils equal, round, reactive to light and accommodation. No scleral icterus.   HEENT: Head atraumatic, normocephalic. Oropharynx and nasopharynx clear.  NECK:  Supple, no jugular venous distention. No thyroid enlargement, no tenderness.  LUNGS: Normal breath sounds bilaterally, no wheezing, rales, rhonchi. No use of accessory muscles of respiration. Port+ CARDIOVASCULAR: S1, S2 normal. No murmurs, rubs, or gallops. tachycardia ABDOMEN: Soft, nontender, nondistended. Bowel sounds present. No organomegaly or mass.  EXTREMITIES: No cyanosis, clubbing or edema b/l.    NEUROLOGIC: Cranial nerves II through XII are intact. No focal Motor or sensory deficits b/l.   PSYCHIATRIC:  patient is alert and oriented x 3. mute SKIN: No obvious rash, lesion, or ulcer.   LABORATORY PANEL:  CBC Recent Labs  Lab 01/04/20 0332  WBC 21.9*  HGB 9.6*  HCT 29.3*  PLT 361    Chemistries  Recent Labs  Lab 01/04/20 0332  NA 132*  K  4.5  CL 99  CO2 27  GLUCOSE 131*  BUN 20  CREATININE 0.95  CALCIUM 9.2  MG 1.8  AST 21  ALT 14  ALKPHOS 78  BILITOT 1.6*   Cardiac Enzymes No results for input(s): TROPONINI in the last 168 hours. RADIOLOGY:  CT ANGIO CHEST PE W OR WO CONTRAST  Result Date: 01/03/2020 CLINICAL DATA:  Shortness of breath, oral swelling, history of squamous cell lung cancer and lymphoma EXAM: CT ANGIOGRAPHY CHEST WITH CONTRAST TECHNIQUE: Multidetector CT imaging of the chest was performed using the standard protocol during bolus administration of intravenous contrast. Multiplanar CT image reconstructions and MIPs were obtained to evaluate the vascular anatomy. CONTRAST:  47mL OMNIPAQUE IOHEXOL 350 MG/ML SOLN COMPARISON:  12/02/2019, 01/03/2020 FINDINGS: Cardiovascular: This is a technically adequate evaluation of the pulmonary vasculature. There are no filling defects or pulmonary emboli. Stable atherosclerosis of the thoracic aorta and coronary vessels. Mediastinum/Nodes: Stable appearance of the thyroid, esophagus, and trachea. No pathologic mediastinal adenopathy. Lungs/Pleura: There is progressive left perihilar consolidation, consistent with progression of disease. Multilocular left pleural effusion is again noted, not appreciably changed in size. There is a new trace right pleural effusion. Increasing right lower lobe pulmonary nodule now measures 13 mm reference image 32, compatible with progression of disease. Upper Abdomen: Stable left lobe hepatic cyst. 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 Reconstructed images demonstrate no additional findings. Review of the MIP images confirms the above findings. IMPRESSION: 1. Progression of disease, with  enlarging left hilar mass and right lower lobe pulmonary nodule. 2. Stable multilocular left pleural effusion. 3. Trace right pleural effusion. Electronically Signed   By: Randa Ngo  M.D.   On: 01/03/2020 22:31   DG Chest Portable 1 View  Result Date: 01/03/2020 CLINICAL DATA:  Short of breath, oral swelling, history of squamous cell lung cancer and lymphoma EXAM: PORTABLE CHEST 1 VIEW COMPARISON:  12/25/2019 FINDINGS: Single frontal view of the chest demonstrates right chest wall port unchanged. Cardiac silhouette is stable. There is increasing consolidation and effusion within the left hemithorax. Stable nodularity within the right midlung zone. Small amount of fluid within the minor fissure. Increased vascular congestion since prior study.  No pneumothorax. IMPRESSION: 1. Worsening left lung consolidation and effusion. 2. Stable nodularity within the right midlung zone. 3. Increased vascular congestion since prior study. Electronically Signed   By: Randa Ngo M.D.   On: 01/03/2020 18:20   ECHOCARDIOGRAM COMPLETE  Result Date: 01/05/2020    ECHOCARDIOGRAM REPORT   Patient Name:   Toni Griffith RKYHC Date of Exam: 01/04/2020 Medical Rec #:  [id number]            Height:       63.0 in Accession #:    [id number]           Weight:       173.1 lb Date of Birth:  03-20-1941            BSA:          1.818 m Patient Age:    54 years             BP:           127/76 mmHg Patient Gender: F                    HR:           100 bpm. Exam Location:  [facility] Procedure: 2D Echo Indications:     Atrial Fibrillation  History:         Patient has prior history of Echocardiogram examinations. Risk                  Factors:Hypertension.  Sonographer:     L Thornton-Maynard Referring Phys:  [id number] [place] Diagnosing Phys: Kathlyn Sacramento MD IMPRESSIONS  1. Left ventricular ejection fraction, by estimation, is 60 to 65%. The left ventricle has normal function. The left ventricle has no regional wall motion abnormalities. There is mild left ventricular hypertrophy. Left ventricular diastolic parameters are indeterminate.  2. Right ventricular systolic function is normal. The right ventricular size  is normal. There is moderately elevated pulmonary artery systolic pressure. The estimated right ventricular systolic pressure is 85.4 mmHg.  3. The mitral valve is normal in structure. Mild mitral valve regurgitation. No evidence of mitral stenosis.  4. Tricuspid valve regurgitation is mild to moderate.  5. The aortic valve is normal in structure. Aortic valve regurgitation is not visualized. Mild aortic valve sclerosis is present, with no evidence of aortic valve stenosis.  6. Mildly dilated pulmonary artery.  7. The inferior vena cava is normal in size with greater than 50% respiratory variability, suggesting right atrial pressure of 3 mmHg. FINDINGS  Left Ventricle: Left ventricular ejection fraction, by estimation, is 60 to 65%. The left ventricle has normal function. The left ventricle has no regional wall motion abnormalities. The left ventricular internal cavity size was normal in size. There is  mild left  ventricular hypertrophy. Left ventricular diastolic parameters are indeterminate. Right Ventricle: The right ventricular size is normal. No increase in right ventricular wall thickness. Right ventricular systolic function is normal. There is moderately elevated pulmonary artery systolic pressure. The tricuspid regurgitant velocity is 3.49 m/s, and with an assumed right atrial pressure of 5 mmHg, the estimated right ventricular systolic pressure is 42.7 mmHg. Left Atrium: Left atrial size was normal in size. Right Atrium: Right atrial size was normal in size. Pericardium: There is no evidence of pericardial effusion. Mitral Valve: The mitral valve is normal in structure. Normal mobility of the mitral valve leaflets. Mild mitral valve regurgitation. No evidence of mitral valve stenosis. MV peak gradient, 4.5 mmHg. The mean mitral valve gradient is 2.0 mmHg. Tricuspid Valve: The tricuspid valve is normal in structure. Tricuspid valve regurgitation is mild to moderate. No evidence of tricuspid stenosis. Aortic  Valve: The aortic valve is normal in structure. Aortic valve regurgitation is not visualized. Mild aortic valve sclerosis is present, with no evidence of aortic valve stenosis. Aortic valve mean gradient measures 8.0 mmHg. Aortic valve peak gradient measures 12.5 mmHg. Aortic valve area, by VTI measures 1.65 cm. Pulmonic Valve: The pulmonic valve was normal in structure. Pulmonic valve regurgitation is mild. No evidence of pulmonic stenosis. Aorta: The aortic root is normal in size and structure. Pulmonary Artery: The pulmonary artery is mildly dilated. Venous: The inferior vena cava is normal in size with greater than 50% respiratory variability, suggesting right atrial pressure of 3 mmHg. IAS/Shunts: No atrial level shunt detected by color flow Doppler.  LEFT VENTRICLE PLAX 2D LVIDd:         4.39 cm  Diastology LVIDs:         2.58 cm  LV e' lateral:   7.72 cm/s LV PW:         1.16 cm  LV E/e' lateral: 6.0 LV IVS:        1.24 cm  LV e' medial:    7.40 cm/s LVOT diam:     2.20 cm  LV E/e' medial:  6.3 LV SV:         48 LV SV Index:   26 LVOT Area:     3.80 cm  RIGHT VENTRICLE RV S prime:     12.60 cm/s TAPSE (M-mode): 1.8 cm LEFT ATRIUM             Index LA diam:        3.70 cm 2.03 cm/m LA Vol (A2C):   62.9 ml 34.59 ml/m LA Vol (A4C):   48.8 ml 26.84 ml/m LA Biplane Vol: 59.4 ml 32.67 ml/m  AORTIC VALVE                    PULMONIC VALVE AV Area (Vmax):    1.77 cm     PV Vmax:       1.19 m/s AV Area (Vmean):   1.61 cm     PV Peak grad:  5.7 mmHg AV Area (VTI):     1.65 cm AV Vmax:           177.00 cm/s AV Vmean:          137.000 cm/s AV VTI:            0.291 m AV Peak Grad:      12.5 mmHg AV Mean Grad:      8.0 mmHg LVOT Vmax:         82.20 cm/s LVOT Vmean:  58.200 cm/s LVOT VTI:          0.126 m LVOT/AV VTI ratio: 0.43  AORTA Ao Root diam: 3.40 cm MITRAL VALVE               TRICUSPID VALVE MV Peak grad: 4.5 mmHg     TR Peak grad:   48.7 mmHg MV Mean grad: 2.0 mmHg     TR Vmax:        349.00 cm/s MV  Vmax:      1.06 m/s MV Vmean:     72.6 cm/s    SHUNTS MV E velocity: 46.70 cm/s  Systemic VTI:  0.13 m MV A velocity: 76.70 cm/s  Systemic Diam: 2.20 cm MV E/A ratio:  0.61 Kathlyn Sacramento MD Electronically signed by Kathlyn Sacramento MD Signature Date/Time: 01/05/2020/9:10:20 AM    Final    ASSESSMENT AND PLAN:  Toni Griffith  is a 79 y.o. female, with history of squamous cell lung cancer, hypertension, GERD, hearing impairment, and arthritis presents to the ER with daughter for burning, swollen lips and mouth.  Daughter is interpreting American sign language.  They both report that patient had started 2 medications 3 days ago.  The first was an Atrovent inhaler, and the second was a new chemo infusion.  Yesterday, patient started to notice swollen burning lips.   #Acute hypoxic respiratory failuret likely secondary to edema versus infiltrate versus worsening lung mass -Lasix and cefepime started in ED--d/c lasix -Continue cefepime, Procalcitonin 0.58--change to oral abxs withelevated wbc and h/o cancer cont abxs for total 7 days -Continue 3 L nasal cannula--wean down as tolerated(does not wear at home) -assess for home oxygen use -CT chest negative for PE  Progression of disease, with enlarging left hilar mass and right lower lobe pulmonary nodule.Stable multilocular left pleural effusion.Trace right pleural effusion -Oncology to see pt--seen by Dr yu--follows with dr Mike Gip -pt was seen by Pallaitive care Josh--FULL code fornow and family will discuss furhter with dr Mike Gip  Sepsis secondary to pneumonia in immunocompromised patient--POA -resolved -Recently finished Levaquin outpatient -Worsening findings on chest x-ray appears due to lung mass--cannot rule out underlying infection -Continue cefepime--change to po meds -BC negative  #A. fib with RVR in the setting of above--new onset -Likely secondary to pulmonary disease Echo in the a.m CHA2DS2-VASc 5, Start heparin drip -pt on IV  metoprolol--change to oral BB  -consider cardiology consult with Dr Donnella Bi chat msg sent. -defer long term anticoagulation with Cardiology  #Oral ulcer - Magic mouthwash with lidocaine  #CHF acute likley diastolic (echo in 1093 EF 55-60%) -Given soft blood pressures (systolic in the 23F) holding off on further diuresis -Monitoring I's and O's -Daily weights -Cardiology consult--Dr Nehemiah Massed -uop 1050cc  #Protein calorie malnutrition 1. Encourage protein shake between meals 2. Dietitian to see pt  PT to see pt   DVT Prophylaxis-  Heparin drip- SCDs   AM Labs Ordered, also please review Full Orders  Family Communication: Admission, patients condition and plan of care including tests being ordered have been discussed with the patient and  Daughter who indicate understanding and agree with the plan. Code Status:  Full  Admission status: inpatient  Procedures:none Family communication :family at bedside Consults :Oncology CODE STATUS: FULL    Dispo: The patient is from: Home              Anticipated d/c is to: Home              Anticipated d/c date is: 2  days              Patient currently is not medically stable to d/c. Cardiology consult Pt on IV heparin gtt. PT pending       TOTAL TIME TAKING CARE OF THIS PATIENT: 35 minutes.  >50% time spent on counselling and coordination of care  Note: This dictation was prepared with Dragon dictation along with smaller phrase technology. Any transcriptional errors that result from this process are unintentional.  Fritzi Mandes M.D    [facility]   CC: Primary care physician; Francesca Oman, DOPatient ID: Toni Griffith, female   DOB: Apr 10, 1941, 79 y.o.   MRN: [id number]

## 2020-01-05 NOTE — Progress Notes (Signed)
[facility] Hematology/Oncology Progress Note  Date of admission: 01/03/2020  Hospital day:  01/05/2020  Chief Complaint: Toni Griffith is a 79 y.o. female who was admitted with acute hypoxic respiratory failure, sepsis secondary to pneumonia, and atrial fibrillation with RVR.  Subjective:  Patient notes general weakness and easily fatigueability.  Shortness of breath has improved. Lips remain tender.  Social History: The patient is accompanied by her daughter (in person), her son (facetime via mobile phone), and the interpreter today.  Allergies: No Known Allergies  Scheduled Medications: . albuterol  2.5 mg Nebulization Q6H  . aspirin EC  81 mg Oral Daily  . atorvastatin  80 mg Oral QPM  . cefdinir  300 mg Oral Q12H  . Chlorhexidine Gluconate Cloth  6 each Topical Daily  . cholecalciferol  1,000 Units Oral Daily  . clotrimazole   Topical BID  . DULoxetine  60 mg Oral Daily  . famotidine  20 mg Oral BID  . feeding supplement (ENSURE ENLIVE)  237 mL Oral BID  . heparin  2,000 Units Intravenous Once  . magic mouthwash  15 mL Oral TID  . mouth rinse  15 mL Mouth Rinse BID  . metoprolol tartrate  50 mg Oral BID  . multivitamin with minerals  1 tablet Oral Daily  . omega-3 acid ethyl esters  1 g Oral Daily  . pantoprazole  40 mg Oral Daily  . polyethylene glycol  17 g Oral BID    Review of Systems: GENERAL:  General fatigue.  Minimal activity causes shortness of breath.  No fevers, sweats. PERFORMANCE STATUS (ECOG):  2-3 HEENT:  Deaf.  Lips tender.  Throat less tender.  No visual changes, runny nose. Lungs: Shortness of breath with minimal exertion.  No cough.  No hemoptysis. Cardiac:  Tachycardia.  No chest pain,orthopnea, or PND. GI:  Decreased appetite.  Nausea.  No vomiting, diarrhea, constipation, melena or hematochezia. GU:  No urgency, frequency, dysuria, or hematuria. Musculoskeletal:  Low back pain.  Knee pain.  No muscle  tenderness. Extremities:  No pain or swelling. Skin:  No rashes or skin changes. 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  No headache, focal numbness or weakness. Endocrine:  No diabetes, thyroid issues, hot flashes or night sweats. Psych:  Discouraged. Pain:  No focal pain. Review of systems:  All other systems reviewed and found to be negative.  Physical Exam: Blood pressure 128/69, pulse (!) 102, temperature 98 F (36.7 C), temperature source Axillary, resp. rate 20, height 5\' 3"  (1.6 m), weight 172 lb 2.9 oz (78.1 kg), SpO2 99 %.  GENERAL:  Fatigued appearing woman lying in bed on the medical unit in no acute distress. MENTAL STATUS:  Alert and oriented to person, place and time. HEAD:  Thin short graying hair.  Normocephalic, atraumatic, face symmetric, no Cushingoid features. EYES:  Hazel eyes.  Pupils equal round and reactive to light and accomodation.  No conjunctivitis or scleral icterus. ENT:  Lips slightly pink without breakdown.  Oropharynx clear without ulceration or thrush.  Tongue normal.  Mucous membranes moist.  RESPIRATORY:  Soft expiratory wheezes.  Decreased breath sounds 1/3 up on the left.  No rhonchi. CARDIOVASCULAR:  Tachycardia.  Irregular rhythm with flow murmur. ABDOMEN:  Soft, non-tender, with active bowel sounds, and no hepatosplenomegaly.  No masses. SKIN:  No rashes, ulcers or lesions. EXTREMITIES: ICDs in place.  No edema, no skin discoloration or tenderness.  No palpable cords. NEUROLOGICAL: Unremarkable. PSYCH:  Appropriate.   Results for orders placed or performed during the hospital encounter of 01/03/20 (from the past 48 hour(s))  SARS Coronavirus 2 by RT PCR (hospital order, performed in [facility] hospital lab) Nasopharyngeal Nasopharyngeal Swab     Status: None   Collection Time: 01/04/20 12:36 AM   Specimen: Nasopharyngeal Swab  Result Value Ref Range   SARS Coronavirus 2 NEGATIVE NEGATIVE    Comment: (NOTE) SARS-CoV-2  target nucleic acids are NOT DETECTED. The SARS-CoV-2 RNA is generally detectable in upper and lower respiratory specimens during the acute phase of infection. The lowest concentration of SARS-CoV-2 viral copies this assay can detect is 250 copies / mL. A negative result does not preclude SARS-CoV-2 infection and should not be used as the sole basis for treatment or other patient management decisions.  A negative result may occur with improper specimen collection / handling, submission of specimen other than nasopharyngeal swab, presence of viral mutation(s) within the areas targeted by this assay, and inadequate number of viral copies (<250 copies / mL). A negative result must be combined with clinical observations, patient history, and epidemiological information. Fact Sheet for Patients:   StrictlyIdeas.no Fact Sheet for Healthcare Providers: BankingDealers.co.za This test is not yet approved or cleared  by the [place] FDA and has been authorized for detection and/or diagnosis of SARS-CoV-2 by FDA under an Emergency Use Authorization (EUA).  This EUA will remain in effect (meaning this test can be used) for the duration of the COVID-19 declaration under Section 564(b)(1) of the Act, 21 U.S.C. section 360bbb-3(b)(1), unless the authorization is terminated or revoked sooner. Performed at [facility], [address] [place], [place] [address]   Glucose, capillary     Status: Abnormal   Collection Time: 01/04/20  1:26 AM  Result Value Ref Range   Glucose-Capillary 119 (H) 70 - 99 mg/dL    Comment: Glucose reference range applies only to samples taken after fasting for at least 8 hours.  MRSA PCR Screening     Status: None   Collection Time: 01/04/20  1:50 AM   Specimen: Nasopharyngeal  Result Value Ref Range   MRSA by PCR NEGATIVE NEGATIVE    Comment:        The GeneXpert MRSA Assay (FDA approved for NASAL  specimens only), is one component of a comprehensive MRSA colonization surveillance program. It is not intended to diagnose MRSA infection nor to guide or monitor treatment for MRSA infections. Performed at [facility], [address] [place], [place] [address]   Urinalysis, Complete w Microscopic     Status: Abnormal   Collection Time: 01/04/20  1:52 AM  Result Value Ref Range   Color, Urine YELLOW (A) YELLOW   APPearance CLEAR (A) CLEAR   Specific Gravity, Urine 1.030 1.005 - 1.030   pH 5.0 5.0 - 8.0   Glucose, UA NEGATIVE NEGATIVE mg/dL   Hgb urine dipstick NEGATIVE NEGATIVE   Bilirubin Urine NEGATIVE NEGATIVE   Ketones, ur NEGATIVE NEGATIVE mg/dL   Protein, ur NEGATIVE NEGATIVE mg/dL   Nitrite NEGATIVE NEGATIVE   Leukocytes,Ua NEGATIVE NEGATIVE   RBC / HPF 0-5 0 - 5 RBC/hpf   WBC, UA 0-5 0 - 5 WBC/hpf   Bacteria, UA RARE (A) NONE SEEN   Squamous Epithelial / LPF 0-5 0 - 5    Comment: Performed at [facility], [address], [place], [place] [address]  Urine culture     Status: None   Collection Time: 01/04/20  1:52 AM   Specimen: Urine, Random  Result Value Ref Range   Specimen Description      URINE, RANDOM Performed at [facility], [address], [place], [place] [address]    Special Requests      NONE Performed at [facility], [address], [place], [place] [address]    Culture      NO GROWTH Performed at [facility], [place] [address], [place], [place] [address]    Report Status 01/05/2020 FINAL   Strep pneumoniae urinary antigen     Status: None   Collection Time: 01/04/20  1:52 AM  Result Value Ref Range   Strep Pneumo Urinary Antigen NEGATIVE NEGATIVE    Comment: Performed at [facility] [address], [place], [place] [address]  Legionella Pneumophila Serogp 1 Ur Ag     Status: None   Collection Time: 01/04/20  1:52 AM  Result Value Ref Range   L. pneumophila Serogp 1 Ur Ag Negative  Negative    Comment: (NOTE) Presumptive negative for L. pneumophila serogroup 1 antigen in urine, suggesting no recent or current infection. Legionnaires' disease cannot be ruled out since other serogroups and species may also cause disease. Performed At: [facility] [place], [place] [id number] Rush Farmer MD OV:[id number]    Source of Sample URINE, RANDOM     Comment: Performed at [facility], [address] [place], [place] [address]  Lactic acid, plasma     Status: None   Collection Time: 01/04/20  2:57 AM  Result Value Ref Range   Lactic Acid, Venous 1.0 0.5 - 1.9 mmol/L    Comment: Performed at [facility], [address] [place], [place] [address]  APTT     Status: Abnormal   Collection Time: 01/04/20  2:57 AM  Result Value Ref Range   aPTT >160 (HH) 24 - 36 seconds    Comment:        IF BASELINE aPTT IS ELEVATED, SUGGEST PATIENT RISK ASSESSMENT BE USED TO DETERMINE APPROPRIATE ANTICOAGULANT THERAPY. CRITICAL RESULT CALLED TO, READ BACK BY AND VERIFIED WITH: A [facility] 01/04/20 AT 0340 HS Performed at [facility], [address] [place], [place] [address]   Protime-INR     Status: Abnormal   Collection Time: 01/04/20  2:57 AM  Result Value Ref Range   Prothrombin Time 18.4 (H) 11.4 - 15.2 seconds   INR 1.6 (H) 0.8 - 1.2    Comment: (NOTE) INR goal varies based on device and disease states. Performed at [facility], [address] [place], [place] [address]   HIV Antibody (routine testing w rflx)     Status: None   Collection Time: 01/04/20  2:57 AM  Result Value Ref Range   HIV Screen 4th Generation wRfx Non Reactive Non Reactive    Comment: Performed at [facility], [place] [address] [place], [place] [address]  Troponin I (High Sensitivity)     Status: Abnormal   Collection Time: 01/04/20  2:57 AM  Result Value Ref Range   Troponin I (High Sensitivity) 21 (H) <18 ng/L    Comment:  (NOTE) Elevated high sensitivity troponin I (hsTnI) values and significant  changes across serial measurements may suggest ACS but many other  chronic and acute conditions are known to elevate hsTnI results.  Refer to the "Links" section for chest pain algorithms and additional  guidance. Performed at [facility], [address] [place], [place] [address]   CBC WITH DIFFERENTIAL     Status: Abnormal   Collection Time: 01/04/20  3:32 AM  Result Value Ref Range   WBC 21.9 (H) 4.0 - 10.5 K/uL   RBC 2.99 (L) 3.87 - 5.11 MIL/uL   Hemoglobin 9.6 (L) 12.0 - 15.0 g/dL   HCT 29.3 (L) 36.0 - 46.0 %   MCV 98.0 80.0 - 100.0 fL   MCH 32.1 26.0 - 34.0 pg   MCHC 32.8 30.0 - 36.0 g/dL   RDW 17.9 (H) 11.5 - 15.5 %   Platelets 361 150 - 400 K/uL   nRBC 0.0 0.0 - 0.2 %   Neutrophils Relative % 96 %   Neutro Abs 21.0 (H) 1.7 - 7.7 K/uL   Lymphocytes Relative 3 %   Lymphs Abs 0.6 (L) 0.7 - 4.0 K/uL   Monocytes Relative 0 %   Monocytes Absolute 0.1 0.1 - 1.0 K/uL   Eosinophils Relative 0 %   Eosinophils Absolute 0.0 0.0 - 0.5 K/uL   Basophils Relative 0 %   Basophils Absolute 0.0 0.0 - 0.1 K/uL   Immature Granulocytes 1 %   Abs Immature Granulocytes 0.24 (H) 0.00 - 0.07 K/uL    Comment: Performed at [facility], [address] [place], [place] [address]  Culture, blood (routine x 2) Call MD if unable to obtain prior to antibiotics being given     Status: None (Preliminary result)   Collection Time: 01/04/20  3:32 AM   Specimen: BLOOD  Result Value Ref Range   Specimen Description BLOOD RIGHT ASSIST CONTROL    Special Requests      BOTTLES DRAWN AEROBIC AND ANAEROBIC Blood Culture results may not be optimal due to an excessive volume of blood received in culture bottles   Culture      NO GROWTH 1 DAY Performed at [facility], [address] [place], [place] [address]    Report Status PENDING   Culture, blood (routine x 2) Call MD if unable to obtain prior  to antibiotics being given     Status: None (Preliminary result)   Collection Time: 01/04/20  3:32 AM   Specimen: BLOOD  Result Value Ref Range   Specimen Description BLOOD LEFT ASSIST CONTROL    Special Requests      BOTTLES DRAWN AEROBIC AND ANAEROBIC Blood Culture adequate volume   Culture      NO GROWTH 1 DAY Performed at [facility], [address] [place], [place] [address]    Report Status PENDING   Comprehensive metabolic panel     Status: Abnormal   Collection Time: 01/04/20  3:32 AM  Result Value Ref Range   Sodium 132 (L) 135 - 145 mmol/L   Potassium 4.5 3.5 - 5.1 mmol/L   Chloride 99 98 - 111 mmol/L   CO2 27 22 - 32 mmol/L   Glucose, Bld 131 (H) 70 - 99 mg/dL    Comment: Glucose reference range applies only to samples taken after fasting for at least 8 hours.   BUN 20 8 - 23 mg/dL   Creatinine, Ser 0.95 0.44 - 1.00 mg/dL   Calcium 9.2 8.9 - 10.3 mg/dL   Total Protein 6.4 (L) 6.5 - 8.1 g/dL   Albumin 2.3 (L) 3.5 - 5.0 g/dL   AST 21 15 - 41 U/L   ALT 14 0 - 44 U/L   Alkaline Phosphatase 78 38 - 126 U/L   Total Bilirubin 1.6 (H) 0.3 -  1.2 mg/dL   GFR calc non Af Amer 58 (L) >60 mL/min   GFR calc Af Amer >60 >60 mL/min   Anion gap 6 5 - 15    Comment: Performed at [facility], [address] [place], [place] [address]  Magnesium     Status: None   Collection Time: 01/04/20  3:32 AM  Result Value Ref Range   Magnesium 1.8 1.7 - 2.4 mg/dL    Comment: Performed at [facility], [address] [place], [place] [address]  TSH     Status: None   Collection Time: 01/04/20  3:32 AM  Result Value Ref Range   TSH 0.473 0.350 - 4.500 uIU/mL    Comment: Performed by a 3rd Generation assay with a functional sensitivity of <=0.01 uIU/mL. Performed at [facility], [place], [place] [address]   Troponin I (High Sensitivity)     Status: Abnormal   Collection Time: 01/04/20  3:32 AM  Result Value Ref Range   Troponin  I (High Sensitivity) 19 (H) <18 ng/L    Comment: (NOTE) Elevated high sensitivity troponin I (hsTnI) values and significant  changes across serial measurements may suggest ACS but many other  chronic and acute conditions are known to elevate hsTnI results.  Refer to the "Links" section for chest pain algorithms and additional  guidance. Performed at [facility], [address] [place], [place] [address]   APTT     Status: Abnormal   Collection Time: 01/04/20 12:20 PM  Result Value Ref Range   aPTT >160 (HH) 24 - 36 seconds    Comment:        IF BASELINE aPTT IS ELEVATED, SUGGEST PATIENT RISK ASSESSMENT BE USED TO DETERMINE APPROPRIATE ANTICOAGULANT THERAPY. CRITICAL RESULT CALLED TO, READ BACK BY AND VERIFIED WITH: KATHRYN CLAYTON AT 1308 01/04/20.PMF Performed at [facility], [place], [place] [address]   Heparin level (unfractionated)     Status: Abnormal   Collection Time: 01/04/20 12:20 PM  Result Value Ref Range   Heparin Unfractionated 1.52 (H) 0.30 - 0.70 IU/mL    Comment: (NOTE) If heparin results are below expected values, and patient dosage has  been confirmed, suggest follow up testing of antithrombin III levels. Performed at [facility], [address] [place], [place] [address] CORRECTED ON 06/06 AT 1310: PREVIOUSLY REPORTED AS 1.52   Procalcitonin - Baseline     Status: None   Collection Time: 01/04/20 12:20 PM  Result Value Ref Range   Procalcitonin 0.58 ng/mL    Comment:        Interpretation: PCT > 0.5 ng/mL and <= 2 ng/mL: Systemic infection (sepsis) is possible, but other conditions are known to elevate PCT as well. (NOTE)       Sepsis PCT Algorithm           Lower Respiratory Tract                                      Infection PCT Algorithm    ----------------------------     ----------------------------         PCT < 0.25 ng/mL                PCT < 0.10 ng/mL         Strongly encourage  Strongly discourage   discontinuation of antibiotics    initiation of antibiotics    ----------------------------     -----------------------------       PCT 0.25 - 0.50 ng/mL            PCT 0.10 - 0.25 ng/mL               OR       >80% decrease in PCT            Discourage initiation of                                            antibiotics      Encourage discontinuation           of antibiotics    ----------------------------     -----------------------------         PCT >= 0.50 ng/mL              PCT 0.26 - 0.50 ng/mL                AND       <80% decrease in PCT             Encourage initiation of                                             antibiotics       Encourage continuation           of antibiotics    ----------------------------     -----------------------------        PCT >= 0.50 ng/mL                  PCT > 0.50 ng/mL               AND         increase in PCT                  Strongly encourage                                      initiation of antibiotics    Strongly encourage escalation           of antibiotics                                     -----------------------------                                           PCT <= 0.25 ng/mL                                                 OR                                        >  80% decrease in PCT                                     Discontinue / Do not initiate                                             antibiotics Performed at [facility], [address] [place], [place] [address]   Bilirubin, direct     Status: None   Collection Time: 01/04/20 12:20 PM  Result Value Ref Range   Bilirubin, Direct 0.2 0.0 - 0.2 mg/dL    Comment: Performed at [facility], [place], [place] [address]  Heparin level (unfractionated)     Status: Abnormal   Collection Time: 01/04/20  1:57 PM  Result Value Ref Range   Heparin Unfractionated <0.10 (L) 0.30 - 0.70 IU/mL    Comment: (NOTE) If  heparin results are below expected values, and patient dosage has  been confirmed, suggest follow up testing of antithrombin III levels. Performed at [facility], [place], [place] [address]   Heparin level (unfractionated)     Status: Abnormal   Collection Time: 01/04/20  9:49 PM  Result Value Ref Range   Heparin Unfractionated <0.10 (L) 0.30 - 0.70 IU/mL    Comment: (NOTE) If heparin results are below expected values, and patient dosage has  been confirmed, suggest follow up testing of antithrombin III levels. Performed at [facility], [place], [place] [address]   Heparin level (unfractionated)     Status: Abnormal   Collection Time: 01/05/20  8:19 AM  Result Value Ref Range   Heparin Unfractionated 0.14 (L) 0.30 - 0.70 IU/mL    Comment: (NOTE) If heparin results are below expected values, and patient dosage has  been confirmed, suggest follow up testing of antithrombin III levels. Performed at [facility], [place], [place] [address]   Heparin level (unfractionated)     Status: Abnormal   Collection Time: 01/05/20  5:14 PM  Result Value Ref Range   Heparin Unfractionated 0.25 (L) 0.30 - 0.70 IU/mL    Comment: (NOTE) If heparin results are below expected values, and patient dosage has  been confirmed, suggest follow up testing of antithrombin III levels. Performed at [facility], [place], [place] [address]   Troponin I (High Sensitivity)     Status: None   Collection Time: 01/05/20  8:11 PM  Result Value Ref Range   Troponin I (High Sensitivity) 14 <18 ng/L    Comment: (NOTE) Elevated high sensitivity troponin I (hsTnI) values and significant  changes across serial measurements may suggest ACS but many other  chronic and acute conditions are known to elevate hsTnI results.  Refer to the "Links" section for chest pain algorithms and additional  guidance. Performed at  [facility], [address] [place], [place] [address]    CT ANGIO CHEST PE W OR WO CONTRAST  Result Date: 01/03/2020 CLINICAL DATA:  Shortness of breath, oral swelling, history of squamous cell lung cancer and lymphoma EXAM: CT ANGIOGRAPHY CHEST WITH CONTRAST TECHNIQUE: Multidetector CT imaging of the chest was performed using the standard protocol during bolus administration of intravenous contrast. Multiplanar CT image reconstructions and MIPs  were obtained to evaluate the vascular anatomy. CONTRAST:  74mL OMNIPAQUE IOHEXOL 350 MG/ML SOLN COMPARISON:  12/02/2019, 01/03/2020 FINDINGS: Cardiovascular: This is a technically adequate evaluation of the pulmonary vasculature. There are no filling defects or pulmonary emboli. Stable atherosclerosis of the thoracic aorta and coronary vessels. Mediastinum/Nodes: Stable appearance of the thyroid, esophagus, and trachea. No pathologic mediastinal adenopathy. Lungs/Pleura: There is progressive left perihilar consolidation, consistent with progression of disease. Multilocular left pleural effusion is again noted, not appreciably changed in size. There is a new trace right pleural effusion. Increasing right lower lobe pulmonary nodule now measures 13 mm reference image 32, compatible with progression of disease. Upper Abdomen: Stable left lobe hepatic cyst. 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 Reconstructed images demonstrate no additional findings. Review of the MIP images confirms the above findings. IMPRESSION: 1. Progression of disease, with enlarging left hilar mass and right lower lobe pulmonary nodule. 2. Stable multilocular left pleural effusion. 3. Trace right pleural effusion. Electronically Signed   By: Randa Ngo M.D.   On: 01/03/2020 22:31   ECHOCARDIOGRAM COMPLETE  Result Date: 01/05/2020    ECHOCARDIOGRAM REPORT   Patient Name:   Toni Griffith [id number] Date of  Exam: 01/04/2020 Medical Rec #:  [id number]            Height:       63.0 in Accession #:    [id number]           Weight:       173.1 lb Date of Birth:  07-02-1941            BSA:          1.818 m Patient Age:    89 years             BP:           127/76 mmHg Patient Gender: F                    HR:           100 bpm. Exam Location:  [facility] Procedure: 2D Echo Indications:     Atrial Fibrillation  History:         Patient has prior history of Echocardiogram examinations. Risk                  Factors:Hypertension.  Sonographer:     L Thornton-Maynard Referring Phys:  [id number] [place] Diagnosing Phys: Kathlyn Sacramento MD IMPRESSIONS  1. Left ventricular ejection fraction, by estimation, is 60 to 65%. The left ventricle has normal function. The left ventricle has no regional wall motion abnormalities. There is mild left ventricular hypertrophy. Left ventricular diastolic parameters are indeterminate.  2. Right ventricular systolic function is normal. The right ventricular size is normal. There is moderately elevated pulmonary artery systolic pressure. The estimated right ventricular systolic pressure is 64.3 mmHg.  3. The mitral valve is normal in structure. Mild mitral valve regurgitation. No evidence of mitral stenosis.  4. Tricuspid valve regurgitation is mild to moderate.  5. The aortic valve is normal in structure. Aortic valve regurgitation is not visualized. Mild aortic valve sclerosis is present, with no evidence of aortic valve stenosis.  6. Mildly dilated pulmonary artery.  7. The inferior vena cava is normal in size with greater than 50% respiratory variability, suggesting right atrial pressure of 3 mmHg. FINDINGS  Left Ventricle: Left ventricular ejection fraction, by estimation, is 60 to 65%. The  left ventricle has normal function. The left ventricle has no regional wall motion abnormalities. The left ventricular internal cavity size was normal in size. There is  mild left ventricular hypertrophy. Left  ventricular diastolic parameters are indeterminate. Right Ventricle: The right ventricular size is normal. No increase in right ventricular wall thickness. Right ventricular systolic function is normal. There is moderately elevated pulmonary artery systolic pressure. The tricuspid regurgitant velocity is 3.49 m/s, and with an assumed right atrial pressure of 5 mmHg, the estimated right ventricular systolic pressure is 93.7 mmHg. Left Atrium: Left atrial size was normal in size. Right Atrium: Right atrial size was normal in size. Pericardium: There is no evidence of pericardial effusion. Mitral Valve: The mitral valve is normal in structure. Normal mobility of the mitral valve leaflets. Mild mitral valve regurgitation. No evidence of mitral valve stenosis. MV peak gradient, 4.5 mmHg. The mean mitral valve gradient is 2.0 mmHg. Tricuspid Valve: The tricuspid valve is normal in structure. Tricuspid valve regurgitation is mild to moderate. No evidence of tricuspid stenosis. Aortic Valve: The aortic valve is normal in structure. Aortic valve regurgitation is not visualized. Mild aortic valve sclerosis is present, with no evidence of aortic valve stenosis. Aortic valve mean gradient measures 8.0 mmHg. Aortic valve peak gradient measures 12.5 mmHg. Aortic valve area, by VTI measures 1.65 cm. Pulmonic Valve: The pulmonic valve was normal in structure. Pulmonic valve regurgitation is mild. No evidence of pulmonic stenosis. Aorta: The aortic root is normal in size and structure. Pulmonary Artery: The pulmonary artery is mildly dilated. Venous: The inferior vena cava is normal in size with greater than 50% respiratory variability, suggesting right atrial pressure of 3 mmHg. IAS/Shunts: No atrial level shunt detected by color flow Doppler.  LEFT VENTRICLE PLAX 2D LVIDd:         4.39 cm  Diastology LVIDs:         2.58 cm  LV e' lateral:   7.72 cm/s LV PW:         1.16 cm  LV E/e' lateral: 6.0 LV IVS:        1.24 cm  LV e'  medial:    7.40 cm/s LVOT diam:     2.20 cm  LV E/e' medial:  6.3 LV SV:         48 LV SV Index:   26 LVOT Area:     3.80 cm  RIGHT VENTRICLE RV S prime:     12.60 cm/s TAPSE (M-mode): 1.8 cm LEFT ATRIUM             Index LA diam:        3.70 cm 2.03 cm/m LA Vol (A2C):   62.9 ml 34.59 ml/m LA Vol (A4C):   48.8 ml 26.84 ml/m LA Biplane Vol: 59.4 ml 32.67 ml/m  AORTIC VALVE                    PULMONIC VALVE AV Area (Vmax):    1.77 cm     PV Vmax:       1.19 m/s AV Area (Vmean):   1.61 cm     PV Peak grad:  5.7 mmHg AV Area (VTI):     1.65 cm AV Vmax:           177.00 cm/s AV Vmean:          137.000 cm/s AV VTI:            0.291 m AV Peak Grad:  12.5 mmHg AV Mean Grad:      8.0 mmHg LVOT Vmax:         82.20 cm/s LVOT Vmean:        58.200 cm/s LVOT VTI:          0.126 m LVOT/AV VTI ratio: 0.43  AORTA Ao Root diam: 3.40 cm MITRAL VALVE               TRICUSPID VALVE MV Peak grad: 4.5 mmHg     TR Peak grad:   48.7 mmHg MV Mean grad: 2.0 mmHg     TR Vmax:        349.00 cm/s MV Vmax:      1.06 m/s MV Vmean:     72.6 cm/s    SHUNTS MV E velocity: 46.70 cm/s  Systemic VTI:  0.13 m MV A velocity: 76.70 cm/s  Systemic Diam: 2.20 cm MV E/A ratio:  0.61 Kathlyn Sacramento MD Electronically signed by Kathlyn Sacramento MD Signature Date/Time: 01/05/2020/9:10:20 AM    Final     Assessment:  Iona Stay is a 79 y.o. 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  Chest CT angiogram on 01/03/2020 revealed an enlarging left hilar mass and right lower lobe nodule, stable left pleural effusion and trace right pleural effusion.  Symptomatically, she is fatigued.  She is interested in pursuing further treatment after discharge from the hospital.  Plan: 1. Stage IV squamous cell lung cancer  She is s/p SBRT to LUL and LLL nodules.  She is  s/p 15 cycles of pembrolizumab.  She is s/p palliative radiation to the right hilar region (completed 08/2019).  She is s/p 4 cycles of carboplatin and Abraxane (last 11/02/2019).  She developed a left sided pleural effusion + squamous cell lung cancer.  She was not eligible for clinical trial at [facility] or [facility] secondary to her lymphoma and performance status.  She received day 1 of cycle #1 gemcitabine on 01/01/2020.  It is unclear if the sensitivity in her lips and swallowing issues were related to gemcitabine.  She is not interested in further gemcitabine.  Long discussion today with patient and her family regarding her thoughts about further therapy.  She is interested in trying a different chemotherapy (Navelbine) after discharge.  Response rates and potential side effects reviewed.  Treatment is palliative. 2. Stage IV gray zone lymphoma  She is s/p 6 cycles of mini-RCHOP.  She is s/p 3 cycles of brentuximab vedotin.  She is s/p 21 cycles of pembrolizumab (last 01/01/2020).  Disease recurrent after mini-CHOP and progressive on brentuximab vedotin.  Disease has been in remission on maintenance pembrolizumab. 3. Acute hypoxic respiratory failure  Etiology felt secondary to pneumonia and edema.  She has been treated with antibiotics and diuresis.  Prior to hospitalization, patient had been on antibiotics:   [facility] admission 05/11-05/13/2021: Cefepime and vancomycin then discharged on Augmentin.   Levaquin beginning 12/25/2019.  Cefepime switched to [facility] today.  Patient on albuterol q 6 hours.  Contact Dr Patsey Berthold, patient's pulmonologist. 4. Atrial fibrillation with RVR  She on metoprolol and IV heparin. 5. Cheilitis  Etiology unclear and possibly related to gemcitabine. 6. Goals of care  Appreciate palliative care consult today.  Code status FULL. 7. Disposition  Patient transferred out of ICU today to 1C.  Patient will go  home to her family's care at time of  discharge.  Continue to monitor.   Lequita Asal, MD  01/05/2020, 8:49 PM

## 2020-01-05 NOTE — Progress Notes (Signed)
ANTICOAGULATION CONSULT NOTE - Initial Consult  Pharmacy Consult for heparin Indication: atrial fibrillation  No Known Allergies  Patient Measurements: Height: 5\' 3"  (160 cm) Weight: 78.1 kg (172 lb 2.9 oz) IBW/kg (Calculated) : 52.4 Heparin Dosing Weight: 61 kg  Vital Signs: Temp: 98 F (36.7 C) (06/07 1720) Temp Source: Axillary (06/07 1720) BP: 109/74 (06/07 1720) Pulse Rate: 110 (06/07 1720)  Labs: Recent Labs    01/03/20 1833 01/04/20 0257 01/04/20 0332 01/04/20 1220 01/04/20 1357 01/04/20 2149 01/05/20 0819 01/05/20 1714  HGB 9.8*  --  9.6*  --   --   --   --   --   HCT 29.6*  --  29.3*  --   --   --   --   --   PLT 363  --  361  --   --   --   --   --   APTT  --  >160*  --  >160*  --   --   --   --   LABPROT  --  18.4*  --   --   --   --   --   --   INR  --  1.6*  --   --   --   --   --   --   HEPARINUNFRC  --   --   --  1.52*   < > <0.10* 0.14* 0.25*  CREATININE 0.79  --  0.95  --   --   --   --   --   TROPONINIHS 14 21* 19*  --   --   --   --   --    < > = values in this interval not displayed.    Estimated Creatinine Clearance: 49.9 mL/min (by C-G formula based on SCr of 0.95 mg/dL).   Medical History: Past Medical History:  Diagnosis Date  . Arthritis   . 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 Deaf   . GERD (gastroesophageal reflux disease)   . Hypertension    NOT ON MED AT PRESENT  . Squamous cell lung cancer (HCC)     Medications:  Scheduled:  . albuterol  2.5 mg Nebulization Q6H  . aspirin EC  81 mg Oral Daily  . atorvastatin  80 mg Oral QPM  . cefdinir  300 mg Oral Q12H  . Chlorhexidine Gluconate Cloth  6 each Topical Daily  . cholecalciferol  1,000 Units Oral Daily  . clotrimazole   Topical BID  . DULoxetine  60 mg Oral Daily  . famotidine  20 mg Oral BID  . feeding supplement (ENSURE ENLIVE)  237 mL Oral BID  . heparin  2,000 Units Intravenous Once  . magic mouthwash  15 mL Oral TID  . mouth rinse  15 mL Mouth Rinse BID  .  metoprolol tartrate  50 mg Oral BID  . multivitamin with minerals  1 tablet Oral Daily  . omega-3 acid ethyl esters  1 g Oral Daily  . pantoprazole  40 mg Oral Daily  . polyethylene glycol  17 g Oral BID    Assessment: Patient arrives x c/o burning/swollen lips w/ h/o squam cell lung CA s/p chemo tx, GERD, COPD w/ pending sleep study, deafness, arrives to ED in afib/aflutter w/ RVR on EKG, troponin WNL, CBC WNL for patient's clinical scenario, but also has elevated WBC w/ left shift and increased vascular congestion on CXR. Baseline aPTT/INR pending, no anticoagulation PTA. Patient is being started  on heparin drip for management of new onset afib w/ a CHADS-VASc score of 4 (HTN, age > 69, female).   Goal of Therapy:  Heparin level 0.3-0.7 units/ml Monitor platelets by anticoagulation protocol: Yes   Plan:  06/07 @ 1714 HL 0.25 subtherapeutic. Will rebolus heparin 2000 units IV x 1 and increase rate to 1150 units/hr and will recheck HL in 8 hours. -CBC low stable will continue to monitor.  Pearla Dubonnet, PharmD Clinical Pharmacist 01/05/2020 7:11 PM

## 2020-01-05 NOTE — Consult Note (Signed)
[place]  [phone] [phone]   Name: Toni Griffith Date: 01/05/2020 MRN: [id number]  DOB: 06-29-1941  Patient Care Team: Francesca Oman, DO as PCP - General (Internal Medicine) Lequita Asal, MD as Medical Oncologist (Hematology and Oncology) Tyler Pita, MD as Consulting Physician (Pulmonary Disease)    REASON FOR CONSULTATION: Toni Griffith is a 79 y.o. 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  She was found to have acute hypoxic respiratory failure secondary to pneumonia.  Patient was also found to have A. fib with RVR.  Palliative care was consulted to address goals.  SOCIAL HISTORY:     reports that she quit smoking about 4 years ago. Her smoking use included cigarettes. She has a 80.00 pack-year smoking history. She has never used smokeless tobacco. She reports current alcohol use. She reports that she does not use drugs.   Patient is widowed.  She lives at home.  She has a son and daughter who are involved in her care.  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  ADVANCE DIRECTIVES:  HC POA is patient's daughter.  Patient's living will his not on file  CODE STATUS: Full code  PAST MEDICAL HISTORY: Past Medical History:  Diagnosis Date  . Arthritis   . Cancer [facility])    lymphoma-right hip  . Deaf   . GERD (gastroesophageal reflux disease)   . Hypertension    NOT ON MED AT PRESENT  . Squamous cell lung cancer ([place])     PAST SURGICAL HISTORY:  Past Surgical History:  Procedure Laterality Date  . CARDIAC SURGERY  1993  . CORONARY ARTERY BYPASS GRAFT     DOUBLE  . CYST REMOVAL HAND Right   . ENDOBRONCHIAL ULTRASOUND N/A 05/30/2018   Procedure: ENDOBRONCHIAL ULTRASOUND;  Surgeon: Tyler Pita,  MD;  Location: [facility] ORS;  Service: Cardiopulmonary;  Laterality: N/A;  . PERIPHERAL VASCULAR CATHETERIZATION N/A 04/06/2016   Procedure: Glori Luis Cath Insertion;  Surgeon: Algernon Huxley, MD;  Location: [place] [facility];  Service: Cardiovascular;  Laterality: N/A;    HEMATOLOGY/ONCOLOGY HISTORY:  Oncology History Overview Note  Patient has history of stage IVB B-cell lymphoma, unclassifiable, with features intermediate between diffuse large B-cell lymphoma and classical Hodgkin's lymphoma ("gray zone" lymphoma).  She underwent right iliac wing bone/bone marrow biopsy on 03/08/2016.  Head MRI on 02/21/2016 revealed no evidence of malignancy. 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   There was activity in the superior left ocular orbit.  There was a large lesion centered in the right iliac wing with soft tissue extension.  There were hypermetabolic right external iliac lymph nodes  She was admitted to [facility] in [facility].  She had renal failure and hypercalcemia. She was treated with IVF, calcitonin and Zometa.The patient has a history of coronary artery disease s/p CABG in 1993.  She has no history of heart failure.  She is deaf and requires an interpreter.  Echo on 04/04/2016 revealed an EF of 55-60%.  Hepatitis B and C testing was negative on 03/30/2016.   G6PD  assay was normal.  She declined LP with MTX prophylaxis.  She received 6 cycles of mini-RCHOP (04/07/2016 - 07/21/2016).  She has had some transient fingertip numbness.  She received radiationto the right iliac wing, from 10/11/2016 - 11/01/2016. She received 3000 cGy over 3 weeks.  PET scan on 01/29/2017 revealed interval progression of hypermetabolic nodules in  the right upper (13 mm compared to 9 mm; SUV 10) and left lower lobes (26 mm compared to 22 mm; SUV 7.5).  There was new hypermetabolic focus of activity along the anterior left pleura although no underlying pleural or lung mass was evident.  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  She underwent SBRT 03/26/2017 - 04/09/2017.  She received 5000 cGy in 5 fractions to the left lower lobe lesion.  She underwent SBRT to a RUL lesion from 05/23/2017 - 06/18/2017.  PET scan on 09/18/2017 revealed a mixed appearance, with marked improvement in the prior 2 pulmonary nodules; but with new Deauville 5 left common iliac lymph node enlargement; new Deauville 5 right paraspinal lesion; and new Deauville 4 left infrahilar nodal activity.  There was a focus of accentuated metabolic activity which had been present along the right lower submandibular gland but currently seemed to project over the subcutaneous tissues without CT correlate. Current maximum SUV 10.5, previously 9.7. There was increased accentuated activity along the left lateral spurring at L2-3. While typically such activity is inflammatory due to spurring, this had increased significantly since the prior exam and merit surveillance. There was a new 8 by 5 mm right upper lobe pulmonary nodule, Deauville 2.  CT guided right paraspinal lesion biopsy on 10/16/2017 was compatible with involvement by the patient's previously diagnosed lymphoma.  The case was referred to [facility] hematopathology.  The neoplastic cells were largely CD30 negative.  Repeat CD30 immunohistochemistry faintly stained > 10% of the large atypical cells. CD20 stained the scattered large atypical cells and few small cells.   She initiated brentuximab vedotin/Adcetris on 11/23/17.   She has a normocytic anemia.  Labs on 04/17/2016 revealed an elevated ferritin (444), iron saturation (11%), low TIBC (240), B12 (2409), and folate (10.8).  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  B12 and folate were normal on 12/06/2017.    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    02/10/2018 - 02/10/2018 Chemotherapy   The patient had palonosetron (ALOXI) injection 0.25 mg, 0.25 mg, Intravenous,  Once, 0 of 4 cycles riTUXimab (RITUXAN) 700 mg in sodium chloride 0.9 % 250 mL (2.1875 mg/mL) infusion, 375 mg/m2 = 700 mg, Intravenous,  Once, 0 of 4 cycles bendamustine (BENDEKA) 125 mg in sodium chloride 0.9 % 50 mL (2.2727 mg/mL) chemo infusion, 70 mg/m2 = 125 mg (100 % of original dose 70 mg/m2), Intravenous,  Once, 0 of 4 cycles Dose modification: 70 mg/m2 (original dose 70 mg/m2, Cycle 1, Reason: Patient Age, Comment: Advance as tolerated)  for chemotherapy treatment.    06/14/2018 - 07/29/2019 Chemotherapy   The patient had pembrolizumab (KEYTRUDA) 200 mg, 200 mg (100 % of original dose 200 mg), Intravenous,  Once, 1 of 1 cycle Dose modification:  200 mg (original dose 200 mg, Cycle 2, Reason: Provider Judgment) pembrolizumab (KEYTRUDA) 200 mg in sodium chloride 0.9 % 50 mL chemo infusion, 150 mg, Intravenous, Once, 15 of 18 cycles Dose modification: 200 mg (original dose 200 mg, Cycle 2, Reason: Other (see comments), Comment: dose) Administration: 200 mg (06/14/2018), 200 mg (07/05/2018), 200 mg (07/26/2018), 200 mg (08/16/2018), 200 mg (11/01/2018), 200 mg (11/22/2018), 200 mg (12/17/2018), 200 mg (02/27/2019), 200 mg (09/06/2018), 200 mg (09/27/2018), 200 mg (03/19/2019), 200 mg (04/09/2019), 200 mg (04/30/2019), 200 mg (05/21/2019), 200 mg (07/09/2019)  for chemotherapy treatment.    08/29/2019 - 11/25/2019  Chemotherapy   The patient had palonosetron (ALOXI) injection 0.25 mg, 0.25 mg, Intravenous,  Once, 4 of 4 cycles Administration: 0.25 mg (08/29/2019), 0.25 mg (10/15/2019), 0.25 mg (11/05/2019), 0.25 mg (09/24/2019) PACLitaxel-protein bound (ABRAXANE) chemo infusion 175 mg, 100 mg/m2 = 175 mg, Intravenous,  Once, 4 of 4 cycles Administration: 175 mg (08/29/2019), 175 mg (09/05/2019), 175 mg (10/15/2019), 175 mg (11/05/2019), 175 mg (09/24/2019), 175 mg (10/01/2019), 175 mg (11/12/2019), 175 mg (10/22/2019) CARBOplatin (PARAPLATIN) 390 mg in sodium chloride 0.9 % 250 mL chemo infusion, 390 mg (100 % of original dose 388.5 mg), Intravenous,  Once, 4 of 4 cycles Dose modification:   (original dose 388.5 mg, Cycle 1, Reason: Provider Judgment, Comment: advance if tolerated) Administration: 390 mg (08/29/2019), 420 mg (10/15/2019), 420 mg (11/05/2019), 420 mg (09/24/2019) fosaprepitant (EMEND) 150 mg in sodium chloride 0.9 % 145 mL IVPB, 150 mg, Intravenous,  Once, 4 of 4 cycles Administration: 150 mg (08/29/2019), 150 mg (10/15/2019), 150 mg (11/05/2019), 150 mg (09/24/2019) pembrolizumab (KEYTRUDA) 200 mg in sodium chloride 0.9 % 50 mL chemo infusion, 200 mg, Intravenous, Once, 4 of 6 cycles Administration: 200 mg (08/29/2019), 200 mg (10/15/2019), 200 mg (11/05/2019), 200 mg (09/24/2019)  for chemotherapy treatment.    01/01/2020 -  Chemotherapy   The patient had pembrolizumab (KEYTRUDA) 200 mg in sodium chloride 0.9 % 50 mL chemo infusion, 200 mg, Intravenous, Once, 1 of 6 cycles Administration: 200 mg (01/01/2020)  for chemotherapy treatment.    01/01/2020 -  Chemotherapy   The patient had gemcitabine (GEMZAR) 1,800 mg in sodium chloride 0.9 % 250 mL chemo infusion, 1,824 mg, Intravenous,  Once, 1 of 4 cycles Administration: 1,800 mg (01/01/2020)  for chemotherapy treatment.    Malignant neoplasm of lung ([place])  09/05/2016 Initial Diagnosis   Squamous cell lung cancer ([place])   06/14/2018 - 07/29/2019 Chemotherapy   The patient  had pembrolizumab (KEYTRUDA) 200 mg, 200 mg (100 % of original dose 200 mg), Intravenous,  Once, 1 of 1 cycle Dose modification: 200 mg (original dose 200 mg, Cycle 2, Reason: Provider Judgment) pembrolizumab (KEYTRUDA) 200 mg in sodium chloride 0.9 % 50 mL chemo infusion, 150 mg, Intravenous, Once, 9 of 15 cycles Dose modification: 200 mg (original dose 200 mg, Cycle 2, Reason: Other (see comments), Comment: dose) Administration: 200 mg (06/14/2018), 200 mg (07/05/2018), 200 mg (07/26/2018), 200 mg (08/16/2018), 200 mg (11/01/2018), 200 mg (11/22/2018), 200 mg (12/17/2018), 200 mg (09/06/2018), 200 mg (09/27/2018)  for chemotherapy treatment.    08/29/2019 - 11/25/2019 Chemotherapy   The patient had palonosetron (ALOXI) injection 0.25 mg, 0.25 mg, Intravenous,  Once, 4 of 4 cycles Administration: 0.25 mg (08/29/2019), 0.25 mg (10/15/2019), 0.25 mg (11/05/2019), 0.25 mg (09/24/2019) PACLitaxel-protein bound (ABRAXANE) chemo infusion 175 mg, 100 mg/m2 = 175 mg, Intravenous,  Once, 4 of 4 cycles Administration: 175 mg (08/29/2019), 175 mg (09/05/2019), 175 mg (10/15/2019), 175 mg (11/05/2019),  175 mg (09/24/2019), 175 mg (10/01/2019), 175 mg (11/12/2019), 175 mg (10/22/2019) CARBOplatin (PARAPLATIN) 390 mg in sodium chloride 0.9 % 250 mL chemo infusion, 390 mg (100 % of original dose 388.5 mg), Intravenous,  Once, 4 of 4 cycles Dose modification:   (original dose 388.5 mg, Cycle 1, Reason: Provider Judgment, Comment: advance if tolerated) Administration: 390 mg (08/29/2019), 420 mg (10/15/2019), 420 mg (11/05/2019), 420 mg (09/24/2019) fosaprepitant (EMEND) 150 mg in sodium chloride 0.9 % 145 mL IVPB, 150 mg, Intravenous,  Once, 4 of 4 cycles Administration: 150 mg (08/29/2019), 150 mg (10/15/2019), 150 mg (11/05/2019), 150 mg (09/24/2019) pembrolizumab (KEYTRUDA) 200 mg in sodium chloride 0.9 % 50 mL chemo infusion, 200 mg, Intravenous, Once, 4 of 6 cycles Administration: 200 mg (08/29/2019), 200 mg (10/15/2019), 200 mg (11/05/2019), 200  mg (09/24/2019)  for chemotherapy treatment.    01/01/2020 -  Chemotherapy   The patient had gemcitabine (GEMZAR) 1,800 mg in sodium chloride 0.9 % 250 mL chemo infusion, 1,824 mg, Intravenous,  Once, 1 of 4 cycles Administration: 1,800 mg (01/01/2020)  for chemotherapy treatment.    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    02/10/2018 - 02/10/2018 Chemotherapy   The patient had palonosetron (ALOXI) injection 0.25 mg, 0.25 mg, Intravenous,  Once, 0 of 4 cycles riTUXimab (RITUXAN) 700 mg in sodium chloride 0.9 % 250 mL (2.1875 mg/mL) infusion, 375 mg/m2 = 700 mg, Intravenous,  Once, 0 of 4 cycles bendamustine (BENDEKA) 125 mg in sodium chloride 0.9 % 50 mL (2.2727 mg/mL) chemo infusion, 70 mg/m2 = 125 mg (100 % of original dose 70 mg/m2), Intravenous,  Once, 0 of 4 cycles Dose modification: 70 mg/m2 (original dose 70 mg/m2, Cycle 1, Reason: Patient Age, Comment: Advance as tolerated)  for chemotherapy treatment.    08/29/2019 - 11/25/2019 Chemotherapy   The patient had palonosetron (ALOXI) injection 0.25 mg, 0.25 mg, Intravenous,  Once, 4 of 4 cycles Administration: 0.25 mg (08/29/2019), 0.25 mg (10/15/2019), 0.25 mg (11/05/2019), 0.25 mg (09/24/2019) PACLitaxel-protein bound (ABRAXANE) chemo infusion 175 mg, 100 mg/m2 = 175 mg, Intravenous,  Once, 4 of 4 cycles Administration: 175 mg (08/29/2019), 175 mg (09/05/2019), 175 mg (10/15/2019), 175 mg (11/05/2019), 175 mg (09/24/2019), 175 mg (10/01/2019), 175 mg (11/12/2019), 175 mg (10/22/2019) CARBOplatin (PARAPLATIN) 390 mg in sodium chloride 0.9 % 250 mL chemo infusion, 390 mg  (100 % of original dose 388.5 mg), Intravenous,  Once, 4 of 4 cycles Dose modification:   (original dose 388.5 mg, Cycle 1, Reason: Provider Judgment, Comment: advance if tolerated) Administration: 390 mg (08/29/2019), 420 mg (10/15/2019), 420 mg (11/05/2019), 420 mg (09/24/2019) fosaprepitant (EMEND) 150 mg in sodium chloride 0.9 % 145 mL IVPB, 150 mg, Intravenous,  Once, 4 of 4 cycles Administration: 150 mg (08/29/2019), 150 mg (10/15/2019), 150 mg (11/05/2019), 150 mg (09/24/2019) pembrolizumab (KEYTRUDA) 200 mg in sodium chloride 0.9 % 50 mL chemo infusion, 200 mg, Intravenous, Once, 4 of 6 cycles Administration: 200 mg (08/29/2019), 200 mg (10/15/2019), 200 mg (11/05/2019), 200 mg (09/24/2019)  for chemotherapy treatment.    01/01/2020 -  Chemotherapy   The patient had pembrolizumab (KEYTRUDA) 200 mg in  sodium chloride 0.9 % 50 mL chemo infusion, 200 mg, Intravenous, Once, 1 of 6 cycles Administration: 200 mg (01/01/2020)  for chemotherapy treatment.    01/01/2020 -  Chemotherapy   The patient had gemcitabine (GEMZAR) 1,800 mg in sodium chloride 0.9 % 250 mL chemo infusion, 1,824 mg, Intravenous,  Once, 1 of 4 cycles Administration: 1,800 mg (01/01/2020)  for chemotherapy treatment.      ALLERGIES:  has No Known Allergies.  MEDICATIONS:  Current Facility-Administered Medications  Medication Dose Route Frequency Provider Last Rate Last Admin  . albuterol (PROVENTIL) (2.5 MG/3ML) 0.083% nebulizer solution 2.5 mg  2.5 mg Nebulization Q6H Zierle-Ghosh, Asia B, DO   2.5 mg at 01/05/20 0754  . aspirin EC tablet 81 mg  81 mg Oral Daily Zierle-Ghosh, Asia B, DO   81 mg at 01/05/20 1013  . atorvastatin (LIPITOR) tablet 80 mg  80 mg Oral QPM Zierle-Ghosh, Asia B, DO   80 mg at 01/04/20 1705  . cefdinir (OMNICEF) capsule 300 mg  300 mg Oral Q12H Fritzi Mandes, MD      . Chlorhexidine Gluconate Cloth 2 % PADS 6 each  6 each Topical Daily Zierle-Ghosh, Asia B, DO   6 each at 01/04/20 0942  . cholecalciferol (VITAMIN  D3) tablet 1,000 Units  1,000 Units Oral Daily Zierle-Ghosh, Asia B, DO   1,000 Units at 01/05/20 1012  . clotrimazole (LOTRIMIN) 1 % cream   Topical BID Earlie Server, MD   1 application at 16/10/96 1013  . diphenhydrAMINE (BENADRYL) injection 25 mg  25 mg Intravenous Q6H PRN Zierle-Ghosh, Asia B, DO      . DULoxetine (CYMBALTA) DR capsule 60 mg  60 mg Oral Daily Zierle-Ghosh, Asia B, DO   60 mg at 01/05/20 1013  . famotidine (PEPCID) tablet 20 mg  20 mg Oral BID Zierle-Ghosh, Asia B, DO   20 mg at 01/05/20 1013  . feeding supplement (ENSURE ENLIVE) (ENSURE ENLIVE) liquid 237 mL  237 mL Oral BID Zierle-Ghosh, Asia B, DO   237 mL at 01/05/20 1151  . guaiFENesin-dextromethorphan (ROBITUSSIN DM) 100-10 MG/5ML syrup 5 mL  5 mL Oral Q4H PRN Zierle-Ghosh, Asia B, DO      . heparin ADULT infusion 100 units/mL (25000 units/26m sodium chloride 0.45%)  1,000 Units/hr Intravenous Continuous CBenita Gutter RPH 10 mL/hr at 01/05/20 1300 1,000 Units/hr at 01/05/20 1300  . HYDROcodone-acetaminophen (NORCO/VICODIN) 5-325 MG per tablet 1 tablet  1 tablet Oral Q4H PRN Zierle-Ghosh, Asia B, DO   1 tablet at 01/04/20 0034  . lidocaine (XYLOCAINE) 2 % viscous mouth solution 15 mL  15 mL Mouth/Throat TID PRN PFritzi Mandes MD   15 mL at 01/05/20 1013  . magic mouthwash  15 mL Oral TID Zierle-Ghosh, Asia B, DO   15 mL at 01/05/20 1016  . MEDLINE mouth rinse  15 mL Mouth Rinse BID MSharion Settler NP   15 mL at 01/05/20 1020  . metoprolol tartrate (LOPRESSOR) tablet 25 mg  25 mg Oral BID PFritzi Mandes MD   Stopped at 01/05/20 1312  . multivitamin with minerals tablet 1 tablet  1 tablet Oral Daily Zierle-Ghosh, Asia B, DO   1 tablet at 01/05/20 1013  . omega-3 acid ethyl esters (LOVAZA) capsule 1 g  1 g Oral Daily Zierle-Ghosh, Asia B, DO   1 g at 01/05/20 1013  . pantoprazole (PROTONIX) EC tablet 40 mg  40 mg Oral Daily Zierle-Ghosh, Asia B, DO   40 mg at 01/05/20 1013  .  polyethylene glycol (MIRALAX / GLYCOLAX) packet 17 g   17 g Oral BID Zierle-Ghosh, Asia B, DO   17 g at 01/05/20 1013   Facility-Administered Medications Ordered in Other Encounters  Medication Dose Route Frequency Provider Last Rate Last Admin  . heparin lock flush 100 unit/mL  500 Units Intravenous Once Corcoran, Melissa C, MD      . sodium chloride flush (NS) 0.9 % injection 10 mL  10 mL Intravenous PRN Nolon Stalls C, MD   10 mL at 04/05/17 1034  . sodium chloride flush (NS) 0.9 % injection 10 mL  10 mL Intravenous PRN Nolon Stalls C, MD   10 mL at 02/27/19 1033    VITAL SIGNS: BP (!) 109/98   Pulse (!) 103   Temp 97.8 F (36.6 C) (Axillary)   Resp 14   Ht '5\' 3"'  (1.6 m)   Wt 172 lb 2.9 oz (78.1 kg)   SpO2 100%   BMI 30.50 kg/m  Filed Weights   01/04/20 0200 01/05/20 0356  Weight: 173 lb 1 oz (78.5 kg) 172 lb 2.9 oz (78.1 kg)    Estimated body mass index is 30.5 kg/m as calculated from the following:   Height as of this encounter: '5\' 3"'  (1.6 m).   Weight as of this encounter: 172 lb 2.9 oz (78.1 kg).  LABS: CBC:    Component Value Date/Time   WBC 21.9 (H) 01/04/2020 0332   HGB 9.6 (L) 01/04/2020 0332   HGB 9.8 (L) 03/30/2016 1347   HCT 29.3 (L) 01/04/2020 0332   PLT 361 01/04/2020 0332   MCV 98.0 01/04/2020 0332   NEUTROABS 21.0 (H) 01/04/2020 0332   LYMPHSABS 0.6 (L) 01/04/2020 0332   MONOABS 0.1 01/04/2020 0332   EOSABS 0.0 01/04/2020 0332   BASOSABS 0.0 01/04/2020 0332   Comprehensive Metabolic Panel:    Component Value Date/Time   NA 132 (L) 01/04/2020 0332   K 4.5 01/04/2020 0332   CL 99 01/04/2020 0332   CO2 27 01/04/2020 0332   BUN 20 01/04/2020 0332   CREATININE 0.95 01/04/2020 0332   GLUCOSE 131 (H) 01/04/2020 0332   CALCIUM 9.2 01/04/2020 0332   AST 21 01/04/2020 0332   ALT 14 01/04/2020 0332   ALKPHOS 78 01/04/2020 0332   BILITOT 1.6 (H) 01/04/2020 0332   PROT 6.4 (L) 01/04/2020 0332   ALBUMIN 2.3 (L) 01/04/2020 0332    RADIOGRAPHIC STUDIES: DG Chest 2 View  Result Date:  12/25/2019 CLINICAL DATA:  79 year old female with history of left-sided pleural effusion. EXAM: CHEST - 2 VIEW COMPARISON:  Chest x-ray 12/05/2019. FINDINGS: Mass-like opacity in the left mid lung again noted. Ill-defined opacities throughout the left mid to lower lung. Trace left pleural effusion. Linear area of architectural distortion in the right mid lung. Small pulmonary nodule measuring 11 mm in the lateral aspect of the right mid lung, similar to the prior examination. No evidence of pulmonary edema. Cardiac silhouette is largely obscured. Aortic atherosclerosis. Status post median sternotomy for CABG. IMPRESSION: 1. The appearance of the chest is very similar to the prior study 12/05/2019 with chronic changes of known lung cancer and postradiation changes, as detailed above. 2. Trace left pleural effusion. 3. Aortic atherosclerosis. Electronically Signed   By: Vinnie Langton M.D.   On: 12/25/2019 10:51   CT ANGIO CHEST PE W OR WO CONTRAST  Result Date: 01/03/2020 CLINICAL DATA:  Shortness of breath, oral swelling, history of squamous cell lung cancer and lymphoma EXAM: CT ANGIOGRAPHY CHEST  WITH CONTRAST TECHNIQUE: Multidetector CT imaging of the chest was performed using the standard protocol during bolus administration of intravenous contrast. Multiplanar CT image reconstructions and MIPs were obtained to evaluate the vascular anatomy. CONTRAST:  8m OMNIPAQUE IOHEXOL 350 MG/ML SOLN COMPARISON:  12/02/2019, 01/03/2020 FINDINGS: Cardiovascular: This is a technically adequate evaluation of the pulmonary vasculature. There are no filling defects or pulmonary emboli. Stable atherosclerosis of the thoracic aorta and coronary vessels. Mediastinum/Nodes: Stable appearance of the thyroid, esophagus, and trachea. No pathologic mediastinal adenopathy. Lungs/Pleura: There is progressive left perihilar consolidation, consistent with progression of disease. Multilocular left pleural effusion is again noted, not  appreciably changed in size. There is a new trace right pleural effusion. Increasing right lower lobe pulmonary nodule now measures 13 mm reference image 32, compatible with progression of disease. Upper Abdomen: Stable left lobe hepatic cyst. 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 Reconstructed images demonstrate no additional findings. Review of the MIP images confirms the above findings. IMPRESSION: 1. Progression of disease, with enlarging left hilar mass and right lower lobe pulmonary nodule. 2. Stable multilocular left pleural effusion. 3. Trace right pleural effusion. Electronically Signed   By: MRanda NgoM.D.   On: 01/03/2020 22:31   DG Chest Left Decubitus  Result Date: 12/25/2019 CLINICAL DATA:  Assess left pleural effusion. History of left lung cancer. EXAM: CHEST - LEFT DECUBITUS COMPARISON:  Earlier same day and 12/05/2019, chest CT 12/02/2019 FINDINGS: Right lung demonstrates mild linear density over the mid lung likely atelectasis with stable right midlung nodule. Moderate stable opacification over the left midlung and left base. No definite free flowing left pleural effusion. Remainder of the exam is unchanged. IMPRESSION: Stable left midlung and left basilar opacification. No definite evidence of free-flowing left pleural effusion. Linear density right midlung unchanged likely atelectasis in stable right midlung nodule. Electronically Signed   By: DMarin OlpM.D.   On: 12/25/2019 10:51   DG Chest Portable 1 View  Result Date: 01/03/2020 CLINICAL DATA:  Short of breath, oral swelling, history of squamous cell lung cancer and lymphoma EXAM: PORTABLE CHEST 1 VIEW COMPARISON:  12/25/2019 FINDINGS: Single frontal view of the chest demonstrates right chest wall port unchanged. Cardiac silhouette is stable. There is increasing consolidation and effusion within the left hemithorax. Stable nodularity within the  right midlung zone. Small amount of fluid within the minor fissure. Increased vascular congestion since prior study.  No pneumothorax. IMPRESSION: 1. Worsening left lung consolidation and effusion. 2. Stable nodularity within the right midlung zone. 3. Increased vascular congestion since prior study. Electronically Signed   By: MRanda NgoM.D.   On: 01/03/2020 18:20   ECHOCARDIOGRAM COMPLETE  Result Date: 01/05/2020    ECHOCARDIOGRAM REPORT   Patient Name:   SSHAANA ACOCELLAWCNOBSDate of Exam: 01/04/2020 Medical Rec #:  [id number]           Height:       63.0 in Accession #:    [id number]          Weight:       173.1 lb Date of Birth:  [birth year]           BSA:          1.818 m Patient Age:    89years             BP:           127/76 mmHg Patient Gender: F  HR:           100 bpm. Exam Location:  [facility] Procedure: 2D Echo Indications:     Atrial Fibrillation  History:         Patient has prior history of Echocardiogram examinations. Risk                  Factors:Hypertension.  Sonographer:     L Thornton-Maynard Referring Phys:  [id number] [place] Diagnosing Phys: Kathlyn Sacramento MD IMPRESSIONS  1. Left ventricular ejection fraction, by estimation, is 60 to 65%. The left ventricle has normal function. The left ventricle has no regional wall motion abnormalities. There is mild left ventricular hypertrophy. Left ventricular diastolic parameters are indeterminate.  2. Right ventricular systolic function is normal. The right ventricular size is normal. There is moderately elevated pulmonary artery systolic pressure. The estimated right ventricular systolic pressure is 63.8 mmHg.  3. The mitral valve is normal in structure. Mild mitral valve regurgitation. No evidence of mitral stenosis.  4. Tricuspid valve regurgitation is mild to moderate.  5. The aortic valve is normal in structure. Aortic valve regurgitation is not visualized. Mild aortic valve sclerosis is present, with no evidence of  aortic valve stenosis.  6. Mildly dilated pulmonary artery.  7. The inferior vena cava is normal in size with greater than 50% respiratory variability, suggesting right atrial pressure of 3 mmHg. FINDINGS  Left Ventricle: Left ventricular ejection fraction, by estimation, is 60 to 65%. The left ventricle has normal function. The left ventricle has no regional wall motion abnormalities. The left ventricular internal cavity size was normal in size. There is  mild left ventricular hypertrophy. Left ventricular diastolic parameters are indeterminate. Right Ventricle: The right ventricular size is normal. No increase in right ventricular wall thickness. Right ventricular systolic function is normal. There is moderately elevated pulmonary artery systolic pressure. The tricuspid regurgitant velocity is 3.49 m/s, and with an assumed right atrial pressure of 5 mmHg, the estimated right ventricular systolic pressure is 46.6 mmHg. Left Atrium: Left atrial size was normal in size. Right Atrium: Right atrial size was normal in size. Pericardium: There is no evidence of pericardial effusion. Mitral Valve: The mitral valve is normal in structure. Normal mobility of the mitral valve leaflets. Mild mitral valve regurgitation. No evidence of mitral valve stenosis. MV peak gradient, 4.5 mmHg. The mean mitral valve gradient is 2.0 mmHg. Tricuspid Valve: The tricuspid valve is normal in structure. Tricuspid valve regurgitation is mild to moderate. No evidence of tricuspid stenosis. Aortic Valve: The aortic valve is normal in structure. Aortic valve regurgitation is not visualized. Mild aortic valve sclerosis is present, with no evidence of aortic valve stenosis. Aortic valve mean gradient measures 8.0 mmHg. Aortic valve peak gradient measures 12.5 mmHg. Aortic valve area, by VTI measures 1.65 cm. Pulmonic Valve: The pulmonic valve was normal in structure. Pulmonic valve regurgitation is mild. No evidence of pulmonic stenosis. Aorta:  The aortic root is normal in size and structure. Pulmonary Artery: The pulmonary artery is mildly dilated. Venous: The inferior vena cava is normal in size with greater than 50% respiratory variability, suggesting right atrial pressure of 3 mmHg. IAS/Shunts: No atrial level shunt detected by color flow Doppler.  LEFT VENTRICLE PLAX 2D LVIDd:         4.39 cm  Diastology LVIDs:         2.58 cm  LV e' lateral:   7.72 cm/s LV PW:         1.16  cm  LV E/e' lateral: 6.0 LV IVS:        1.24 cm  LV e' medial:    7.40 cm/s LVOT diam:     2.20 cm  LV E/e' medial:  6.3 LV SV:         48 LV SV Index:   26 LVOT Area:     3.80 cm  RIGHT VENTRICLE RV S prime:     12.60 cm/s TAPSE (M-mode): 1.8 cm LEFT ATRIUM             Index LA diam:        3.70 cm 2.03 cm/m LA Vol (A2C):   62.9 ml 34.59 ml/m LA Vol (A4C):   48.8 ml 26.84 ml/m LA Biplane Vol: 59.4 ml 32.67 ml/m  AORTIC VALVE                    PULMONIC VALVE AV Area (Vmax):    1.77 cm     PV Vmax:       1.19 m/s AV Area (Vmean):   1.61 cm     PV Peak grad:  5.7 mmHg AV Area (VTI):     1.65 cm AV Vmax:           177.00 cm/s AV Vmean:          137.000 cm/s AV VTI:            0.291 m AV Peak Grad:      12.5 mmHg AV Mean Grad:      8.0 mmHg LVOT Vmax:         82.20 cm/s LVOT Vmean:        58.200 cm/s LVOT VTI:          0.126 m LVOT/AV VTI ratio: 0.43  AORTA Ao Root diam: 3.40 cm MITRAL VALVE               TRICUSPID VALVE MV Peak grad: 4.5 mmHg     TR Peak grad:   48.7 mmHg MV Mean grad: 2.0 mmHg     TR Vmax:        349.00 cm/s MV Vmax:      1.06 m/s MV Vmean:     72.6 cm/s    SHUNTS MV E velocity: 46.70 cm/s  Systemic VTI:  0.13 m MV A velocity: 76.70 cm/s  Systemic Diam: 2.20 cm MV E/A ratio:  0.61 Kathlyn Sacramento MD Electronically signed by Kathlyn Sacramento MD Signature Date/Time: 01/05/2020/9:10:20 AM    Final     PERFORMANCE STATUS (ECOG) : 3 - Symptomatic, >50% confined to bed  Review of Systems Unless otherwise noted, a complete review of systems is  negative.  Physical Exam General: NAD, frail appearing Cardiovascular: regular rate and rhythm Pulmonary: Wheezing Extremities: no edema, no joint deformities Skin: no rashes Neurological: Weakness but otherwise nonfocal  IMPRESSION: I met with patient and son.  Patient's daughter participated in the visit via [place].  I used an ASL interpreter to communicate.  I introduced palliative care services and attempted to establish therapeutic rapport.  Patient says that she is feeling better each day.  She denied any symptomatic complaints at present.  Together, we reviewed patient's current medical problems and work-up to date.  Patient and family plan to meet with [name] later today to discuss treatment options for her lung cancer.  Patient communicated that are her goals are aligned with treatment if any options are available.  Son says that he would  like to know her prognosis and response rate.  We discussed CODE STATUS in detail.  Her daughter stated that she is afraid patient was not understanding.  However, patient communicated that she wished to remain a full code but that she would discuss with her family.  Patient's HC POA is on file.  Daughter is unsure if patient has a living will but they would be interested in establishing 1 while she is in the hospital if possible.  I will consult the chaplain to assist with this.  PLAN: -Continue current scope of treatment -Chaplain to assist with establishing ACP -Full code -Will follow  Case and plan discussed with Dr. Posey Pronto and [name]   Patient expressed understanding and was in agreement with this plan. She also understands that She can call the clinic at any time with any questions, concerns, or complaints.     Time Total: 60 minutes  Visit consisted of counseling and education dealing with the complex and emotionally intense issues of symptom management and palliative care in the setting of serious and potentially  life-threatening illness.Greater than 50%  of this time was spent counseling and coordinating care related to the above assessment and plan.  Signed by: Altha Harm, PhD, NP-C

## 2020-01-06 ENCOUNTER — Other Ambulatory Visit: Payer: Self-pay | Admitting: Hematology and Oncology

## 2020-01-06 DIAGNOSIS — C3492 Malignant neoplasm of unspecified part of left bronchus or lung: Secondary | ICD-10-CM

## 2020-01-06 DIAGNOSIS — I480 Paroxysmal atrial fibrillation: Secondary | ICD-10-CM

## 2020-01-06 LAB — CBC
HCT: 25.7 % — ABNORMAL LOW (ref 36.0–46.0)
Hemoglobin: 8.4 g/dL — ABNORMAL LOW (ref 12.0–15.0)
MCH: 31.8 pg (ref 26.0–34.0)
MCHC: 32.7 g/dL (ref 30.0–36.0)
MCV: 97.3 fL (ref 80.0–100.0)
Platelets: 273 10*3/uL (ref 150–400)
RBC: 2.64 MIL/uL — ABNORMAL LOW (ref 3.87–5.11)
RDW: 18.1 % — ABNORMAL HIGH (ref 11.5–15.5)
WBC: 13 10*3/uL — ABNORMAL HIGH (ref 4.0–10.5)
nRBC: 0 % (ref 0.0–0.2)

## 2020-01-06 LAB — HEPARIN LEVEL (UNFRACTIONATED): Heparin Unfractionated: 0.36 IU/mL (ref 0.30–0.70)

## 2020-01-06 LAB — HEMOGLOBIN: Hemoglobin: 8.9 g/dL — ABNORMAL LOW (ref 12.0–15.0)

## 2020-01-06 MED ORDER — BISACODYL 5 MG PO TBEC
10.0000 mg | DELAYED_RELEASE_TABLET | Freq: Every day | ORAL | Status: DC
  Administered 2020-01-06 – 2020-01-09 (×4): 10 mg via ORAL
  Filled 2020-01-06 (×4): qty 2

## 2020-01-06 MED ORDER — BISACODYL 10 MG RE SUPP
10.0000 mg | Freq: Every day | RECTAL | Status: DC
  Administered 2020-01-07: 11:00:00 10 mg via RECTAL
  Filled 2020-01-06: qty 1

## 2020-01-06 MED ORDER — METOPROLOL TARTRATE 5 MG/5ML IV SOLN: 5.0000 mg | INTRAVENOUS | Status: DC | PRN

## 2020-01-06 MED ORDER — FLEET ENEMA 7-19 GM/118ML RE ENEM: 1.0000 | ENEMA | Freq: Once | RECTAL | Status: DC

## 2020-01-06 MED ORDER — APIXABAN 5 MG PO TABS
5.0000 mg | ORAL_TABLET | Freq: Two times a day (BID) | ORAL | Status: DC
  Administered 2020-01-06 – 2020-01-09 (×7): 5 mg via ORAL
  Filled 2020-01-06 (×7): qty 1

## 2020-01-06 MED ORDER — ONDANSETRON HCL 4 MG/2ML IJ SOLN
4.0000 mg | Freq: Four times a day (QID) | INTRAMUSCULAR | Status: DC | PRN
  Administered 2020-01-06 – 2020-01-07 (×2): 4 mg via INTRAVENOUS
  Filled 2020-01-06 (×2): qty 2

## 2020-01-06 NOTE — Progress Notes (Signed)
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

## 2020-01-06 NOTE — Progress Notes (Signed)
NP Randol Kern made aware that tele reports pt converted to afib controlled rate, pt to receive metoprolol in morning med pass, NP acknowledged, no new orders

## 2020-01-06 NOTE — Progress Notes (Signed)
Dr. Posey Pronto made aware that patient was having nausea and elevated HR. New order for Zofran. Patients HR was 138 but then decreased. New order for Metoprolol PRN.

## 2020-01-06 NOTE — Progress Notes (Signed)
ANTICOAGULATION CONSULT NOTE -  Pharmacy Consult for Eliquis Indication: atrial fibrillation  No Known Allergies  Patient Measurements: Height: 5\' 3"  (160 cm) Weight: 78.1 kg (172 lb 2.9 oz) IBW/kg (Calculated) : 52.4 Heparin Dosing Weight: 61 kg  Vital Signs: Temp: 98 F (36.7 C) (06/08 0415) Temp Source: Axillary (06/08 0415) BP: 126/74 (06/08 0415) Pulse Rate: 93 (06/08 0415)  Labs: Recent Labs    01/03/20 1833 01/03/20 1833 01/04/20 0257 01/04/20 0332 01/04/20 1220 01/04/20 1357 01/05/20 0819 01/05/20 1714 01/05/20 2011 01/05/20 2127 01/06/20 0254  HGB 9.8*   < >  --  9.6*  --   --   --   --   --   --  8.4*  HCT 29.6*  --   --  29.3*  --   --   --   --   --   --  25.7*  PLT 363  --   --  361  --   --   --   --   --   --  273  APTT  --   --  >160*  --  >160*  --   --   --   --   --   --   LABPROT  --   --  18.4*  --   --   --   --   --   --   --   --   INR  --   --  1.6*  --   --   --   --   --   --   --   --   HEPARINUNFRC  --   --   --   --  1.52*   < > 0.14* 0.25*  --   --  0.36  CREATININE 0.79  --   --  0.95  --   --   --   --   --   --   --   TROPONINIHS 14   < > 21* 19*  --   --   --   --  14 13  --    < > = values in this interval not displayed.    Estimated Creatinine Clearance: 49.9 mL/min (by C-G formula based on SCr of 0.95 mg/dL).   Medical History: Past Medical History:  Diagnosis Date  . Arthritis   . Cancer [facility])    lymphoma-right hip  . Deaf   . GERD (gastroesophageal reflux disease)   . Hypertension    NOT ON MED AT PRESENT  . Squamous cell lung cancer (HCC)     Medications:  Scheduled:  . albuterol  2.5 mg Nebulization Q6H  . apixaban  5 mg Oral BID  . aspirin EC  81 mg Oral Daily  . atorvastatin  80 mg Oral QPM  . cefdinir  300 mg Oral Q12H  . Chlorhexidine Gluconate Cloth  6 each Topical Daily  . cholecalciferol  1,000 Units Oral Daily  . clotrimazole   Topical BID  . DULoxetine  60 mg Oral Daily  . famotidine  20 mg  Oral BID  . feeding supplement (ENSURE ENLIVE)  237 mL Oral BID  . magic mouthwash  15 mL Oral TID  . mouth rinse  15 mL Mouth Rinse BID  . metoprolol tartrate  50 mg Oral BID  . multivitamin with minerals  1 tablet Oral Daily  . omega-3 acid ethyl esters  1 g Oral Daily  . pantoprazole  40 mg Oral  Daily  . polyethylene glycol  17 g Oral BID    Assessment: Patient arrives x c/o burning/swollen lips w/ h/o squam cell lung CA s/p chemo tx, GERD, COPD w/ pending sleep study, deafness, arrives to ED in afib/aflutter w/ RVR on EKG, troponin WNL, CBC WNL for patient's clinical scenario, but also has elevated WBC w/ left shift and increased vascular congestion on CXR. Baseline aPTT/INR pending, no anticoagulation PTA.   Patient was started on heparin drip for management of new onset afib w/ a CHADS-VASc score of 4 (HTN, age > 19, female).   Pharmacy has now been consulted to start Eliquis   Goal of Therapy:  Monitor platelets by anticoagulation protocol: Yes   Plan:  Will start Eliquis 5mg  bid, and discontinue heparin drip with first dose of Eliquis.   -H/H lower, PLTs stable will continue to monitor.  Will check CBC and Scr a minimum of every 3 days per protocol.  Lu Duffel, PharmD, BCPS Clinical Pharmacist 01/06/2020 7:29 AM

## 2020-01-06 NOTE — Evaluation (Addendum)
Physical Therapy Evaluation Patient Details Name: Toni Griffith MRN: [id number] DOB: [birth year] Today's Date: 01/06/2020   History of Present Illness  presented to ER secondary to SOB, burning/swollen lips; admitted for management of acute hypoxic respiratory failure and sepsis related to PNA, also noted in afib with RVR upon admission.  Clinical Impression  Upon evaluation, patient alert and oriented to basic information; follows commands and agreeable to participation with session.  Bilat UE/LE strength and ROM grossly WFL and symmetrical; appropriate for basic transfers and mobility.  Endorses mild dizziness with transition to upright; improves with accommodation to position, vitals stable and WFL.  Able to complete bed mobility with min assist; sit/stand, basic transfers and gait (5' x3) with RW, min assist.  Requires forward flexed posture, propping/leaning on RW despite cuing for hand placement; shuffling stepping pattern with mod SOB (sats >92% on 1L).  Limited endurance/activity tolerance noted; unable to tolerate additional distance at this time. Would benefit from skilled PT to address above deficits and promote optimal return to PLOF.; recommend transition to STR upon discharge from acute hospitalization, but will monitor progress and update as appropriate.  Of note, if patient/family opt for discharge home, may consider access to [facility] and cushion for safety with mobility in home/community as needed.  ASL interpreter, Beryl Meager with [employer] present to assist with communication throughout session.     Follow Up Recommendations SNF(will monitor progress and update as appropriate)    Equipment Recommendations  Rolling walker with 5" wheels;3in1 (PT);Wheelchair (measurements PT);Wheelchair cushion (measurements PT)    Recommendations for Other Services       Precautions / Restrictions Precautions Precautions: Fall Restrictions Weight Bearing  Restrictions: No      Mobility  Bed Mobility Overal bed mobility: Needs Assistance Bed Mobility: Supine to Sit     Supine to sit: Min assist     General bed mobility comments: for truncal elevation  Transfers Overall transfer level: Needs assistance Equipment used: Rolling walker (2 wheeled) Transfers: Sit to/from Stand Sit to Stand: Min assist         General transfer comment: assist for lift off, standing balance; prefers to lean/prop on RW despite cuing for hand placement and postural extension  Ambulation/Gait Ambulation/Gait assistance: Min assist Gait Distance (Feet): (5' x3) Assistive device: Rolling walker (2 wheeled)       General Gait Details: forward flexed posture, propping/leaning on RW despite cuing for hand placement; shuffling stepping pattern with mod SOB (sats >92% on 1L).  Limited endurance/activity tolerance noted; unable to tolerate additional distance at this time  Stairs            Wheelchair Mobility    Modified Rankin (Stroke Patients Only)       Balance Overall balance assessment: Needs assistance Sitting-balance support: No upper extremity supported;Feet supported Sitting balance-Leahy Scale: Good     Standing balance support: Bilateral upper extremity supported Standing balance-Leahy Scale: Fair                               Pertinent Vitals/Pain Pain Assessment: No/denies pain    Home Living Family/patient expects to be discharged to:: Private residence Living Arrangements: Spouse/significant other;Children Available Help at Discharge: Family;Available PRN/intermittently Type of Home: House Home Access: Stairs to enter Entrance Stairs-Rails: Psychiatric nurse of Steps: 6 Home Layout: One level Home Equipment: Walker - 2 wheels;Cane - single point;Shower seat      Prior Function Level  of Independence: Independent with assistive device(s)         Comments: Ambulatory for household  distances with 4WRW; assist from boyfriend for ADLs, household chores as appropriate.  No home O2.  Denies recent fall history.     Hand Dominance        Extremity/Trunk Assessment   Upper Extremity Assessment Upper Extremity Assessment: Overall WFL for tasks assessed    Lower Extremity Assessment Lower Extremity Assessment: Generalized weakness(grossly 4-/5 throughout)       Communication   Communication: Interpreter utilized(ASL interpreter Beryl Meager from [employer] utilized throughout session)  Cognition Arousal/Alertness: Awake/alert Behavior During Therapy: WFL for tasks assessed/performed Overall Cognitive Status: Within Functional Limits for tasks assessed                                        General Comments      Exercises Other Exercises Other Exercises: Toilet transfer, SPT with RW, min assist for sit/stand, standing balance and safety; dep assist for hygiene, clothing management. Other Exercises: Educated in seated therex for use as HEP outside of therapy-ankle pumps, LAQs, rowing/scapular retraction with deep breathing.  Patient demonstrates understanding and agreeable to sets of 10 as tolerated.   Assessment/Plan    PT Assessment Patient needs continued PT services  PT Problem List Decreased strength;Decreased activity tolerance;Decreased balance;Decreased mobility;Decreased knowledge of use of DME;Decreased safety awareness;Decreased knowledge of precautions;Cardiopulmonary status limiting activity;Obesity       PT Treatment Interventions DME instruction;Gait training;Stair training;Functional mobility training;Therapeutic activities;Therapeutic exercise;Patient/family education;Balance training    PT Goals (Current goals can be found in the Care Plan section)  Acute Rehab PT Goals Patient Stated Goal: agreeable to session PT Goal Formulation: With patient Time For Goal Achievement: 01/20/20 Potential to Achieve  Goals: Fair    Frequency Min 2X/week   Barriers to discharge Decreased caregiver support      Co-evaluation               AM-PAC PT "6 Clicks" Mobility  Outcome Measure Help needed turning from your back to your side while in a flat bed without using bedrails?: None Help needed moving from lying on your back to sitting on the side of a flat bed without using bedrails?: A Little Help needed moving to and from a bed to a chair (including a wheelchair)?: A Little Help needed standing up from a chair using your arms (e.g., wheelchair or bedside chair)?: A Little Help needed to walk in hospital room?: A Little Help needed climbing 3-5 steps with a railing? : A Lot 6 Click Score: 18    End of Session Equipment Utilized During Treatment: Gait belt;Oxygen Activity Tolerance: Patient limited by fatigue Patient left: in chair;with call bell/phone within reach;with chair alarm set Nurse Communication: Mobility status PT Visit Diagnosis: Muscle weakness (generalized) (M62.81);Difficulty in walking, not elsewhere classified (R26.2)    Time: 2633-3545 PT Time Calculation (min) (ACUTE ONLY): 47 min   Charges:   PT Evaluation $PT Eval Moderate Complexity: 1 Mod PT Treatments $Therapeutic Exercise: 8-22 mins $Therapeutic Activity: 8-22 mins        Satrina Magallanes H. Owens Shark, PT, DPT, NCS 01/06/20, 4:37 PM [phone]

## 2020-01-06 NOTE — Progress Notes (Addendum)
NP made aware that pt coughed up a small amt of thin bloody mucus, reminded that pt on heparin drip, NP acknowledged, order to continue to monitor pt at this time, repeat HGB at 1100

## 2020-01-06 NOTE — Progress Notes (Signed)
[facility] Encounter Note  Patient: Toni Griffith / Admit Date: 01/03/2020 / Date of Encounter: 01/06/2020, 6:32 PM   Subjective: Patient overall condition not changed from admission.  Severe shortness of breath with next Tory wheezes.  Patient's heart rate much better controlled at 95 bpm telemetry continuing to show atrial flutter with variable heart block.  No evidence of congestive heart failure or anginal symptoms or myocardial infarction at this time.  Review of Systems: Positive for: Shortness of breath Negative for: Vision change, hearing change, syncope, dizziness, nausea, vomiting,diarrhea, bloody stool, stomach pain, cough, congestion, diaphoresis, urinary frequency, urinary pain,skin lesions, skin rashes Others previously listed  Objective: Telemetry: Atrial flutter with variable heart rate Physical Exam: Blood pressure 124/82, pulse 93, temperature 98.6 F (37 C), temperature source Oral, resp. rate (!) 22, height 5\' 3"  (1.6 m), weight 78.1 kg, SpO2 97 %. Body mass index is 30.5 kg/m. General: Well developed, well nourished, in no acute distress. Head: Normocephalic, atraumatic, sclera non-icteric, no xanthomas, nares are without discharge. Neck: No apparent masses Lungs: Normal respirations with diffuse wheezes, no rhonchi, no rales , basilar crackles   Heart: Irregular rate and rhythm, normal S1 S2, no murmur, no rub, no gallop, PMI is normal size and placement, carotid upstroke normal without bruit, jugular venous pressure normal Abdomen: Soft, non-tender, non-distended with normoactive bowel sounds. No hepatosplenomegaly. Abdominal aorta is normal size without bruit Extremities: Trace edema, no clubbing, no cyanosis, no ulcers,  Peripheral: 2+ radial, 2+ femoral, 2+ dorsal pedal pulses Neuro: Alert and oriented. Moves all extremities spontaneously. Psych:  Responds to questions appropriately with a normal affect.   Intake/Output Summary (Last  24 hours) at 01/06/2020 1832 Last data filed at 01/06/2020 0900 Gross per 24 hour  Intake 305.6 ml  Output 600 ml  Net -294.4 ml    Inpatient Medications:  . albuterol  2.5 mg Nebulization Q6H  . apixaban  5 mg Oral BID  . aspirin EC  81 mg Oral Daily  . atorvastatin  80 mg Oral QPM  . bisacodyl  10 mg Oral Daily  . bisacodyl  10 mg Rectal Daily  . cefdinir  300 mg Oral Q12H  . Chlorhexidine Gluconate Cloth  6 each Topical Daily  . cholecalciferol  1,000 Units Oral Daily  . clotrimazole   Topical BID  . DULoxetine  60 mg Oral Daily  . famotidine  20 mg Oral BID  . feeding supplement (ENSURE ENLIVE)  237 mL Oral BID  . magic mouthwash  15 mL Oral TID  . mouth rinse  15 mL Mouth Rinse BID  . metoprolol tartrate  50 mg Oral BID  . multivitamin with minerals  1 tablet Oral Daily  . omega-3 acid ethyl esters  1 g Oral Daily  . pantoprazole  40 mg Oral Daily  . polyethylene glycol  17 g Oral BID  . sodium phosphate  1 enema Rectal Once   Infusions:   Labs: Recent Labs    01/03/20 1833 01/04/20 0332  NA 132* 132*  K 4.5 4.5  CL 98 99  CO2 24 27  GLUCOSE 117* 131*  BUN 19 20  CREATININE 0.79 0.95  CALCIUM 9.4 9.2  MG  --  1.8   Recent Labs    01/03/20 1833 01/04/20 0332  AST 21 21  ALT 14 14  ALKPHOS 77 78  BILITOT 1.3* 1.6*  PROT 6.3* 6.4*  ALBUMIN 2.3* 2.3*   Recent Labs    01/03/20 1833 01/03/20  1660 01/04/20 0332 01/04/20 0332 01/06/20 0254 01/06/20 1040  WBC 20.9*   < > 21.9*  --  13.0*  --   NEUTROABS 20.1*  --  21.0*  --   --   --   HGB 9.8*   < > 9.6*   < > 8.4* 8.9*  HCT 29.6*   < > 29.3*  --  25.7*  --   MCV 96.7   < > 98.0  --  97.3  --   PLT 363   < > 361  --  273  --    < > = values in this interval not displayed.   No results for input(s): CKTOTAL, CKMB, TROPONINI in the last 72 hours. Invalid input(s): POCBNP No results for input(s): HGBA1C in the last 72 hours.   Weights: Filed Weights   01/04/20 0200 01/05/20 0356  Weight: 78.5 kg  78.1 kg     Radiology/Studies:  DG Chest 2 View  Result Date: 12/25/2019 CLINICAL DATA:  79 year old female with history of left-sided pleural effusion. EXAM: CHEST - 2 VIEW COMPARISON:  Chest x-ray 12/05/2019. FINDINGS: Mass-like opacity in the left mid lung again noted. Ill-defined opacities throughout the left mid to lower lung. Trace left pleural effusion. Linear area of architectural distortion in the right mid lung. Small pulmonary nodule measuring 11 mm in the lateral aspect of the right mid lung, similar to the prior examination. No evidence of pulmonary edema. Cardiac silhouette is largely obscured. Aortic atherosclerosis. Status post median sternotomy for CABG. IMPRESSION: 1. The appearance of the chest is very similar to the prior study 12/05/2019 with chronic changes of known lung cancer and postradiation changes, as detailed above. 2. Trace left pleural effusion. 3. Aortic atherosclerosis. Electronically Signed   By: Vinnie Langton M.D.   On: 12/25/2019 10:51   CT ANGIO CHEST PE W OR WO CONTRAST  Result Date: 01/03/2020 CLINICAL DATA:  Shortness of breath, oral swelling, history of squamous cell lung cancer and lymphoma EXAM: CT ANGIOGRAPHY CHEST WITH CONTRAST TECHNIQUE: Multidetector CT imaging of the chest was performed using the standard protocol during bolus administration of intravenous contrast. Multiplanar CT image reconstructions and MIPs were obtained to evaluate the vascular anatomy. CONTRAST:  54mL OMNIPAQUE IOHEXOL 350 MG/ML SOLN COMPARISON:  12/02/2019, 01/03/2020 FINDINGS: Cardiovascular: This is a technically adequate evaluation of the pulmonary vasculature. There are no filling defects or pulmonary emboli. Stable atherosclerosis of the thoracic aorta and coronary vessels. Mediastinum/Nodes: Stable appearance of the thyroid, esophagus, and trachea. No pathologic mediastinal adenopathy. Lungs/Pleura: There is progressive left perihilar consolidation, consistent with progression  of disease. Multilocular left pleural effusion is again noted, not appreciably changed in size. There is a new trace right pleural effusion. Increasing right lower lobe pulmonary nodule now measures 13 mm reference image 32, compatible with progression of disease. Upper Abdomen: Stable left lobe hepatic cyst. 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 Reconstructed images demonstrate no additional findings. Review of the MIP images confirms the above findings. IMPRESSION: 1. Progression of disease, with enlarging left hilar mass and right lower lobe pulmonary nodule. 2. Stable multilocular left pleural effusion. 3. Trace right pleural effusion. Electronically Signed   By: Randa Ngo M.D.   On: 01/03/2020 22:31   DG Chest Left Decubitus  Result Date: 12/25/2019 CLINICAL DATA:  Assess left pleural effusion. History of left lung cancer. EXAM: CHEST - LEFT DECUBITUS COMPARISON:  Earlier same day and 12/05/2019, chest CT 12/02/2019 FINDINGS: Right  lung demonstrates mild linear density over the mid lung likely atelectasis with stable right midlung nodule. Moderate stable opacification over the left midlung and left base. No definite free flowing left pleural effusion. Remainder of the exam is unchanged. IMPRESSION: Stable left midlung and left basilar opacification. No definite evidence of free-flowing left pleural effusion. Linear density right midlung unchanged likely atelectasis in stable right midlung nodule. Electronically Signed   By: Marin Olp M.D.   On: 12/25/2019 10:51   DG Chest Portable 1 View  Result Date: 01/03/2020 CLINICAL DATA:  Short of breath, oral swelling, history of squamous cell lung cancer and lymphoma EXAM: PORTABLE CHEST 1 VIEW COMPARISON:  12/25/2019 FINDINGS: Single frontal view of the chest demonstrates right chest wall port unchanged. Cardiac silhouette is stable. There is increasing consolidation and  effusion within the left hemithorax. Stable nodularity within the right midlung zone. Small amount of fluid within the minor fissure. Increased vascular congestion since prior study.  No pneumothorax. IMPRESSION: 1. Worsening left lung consolidation and effusion. 2. Stable nodularity within the right midlung zone. 3. Increased vascular congestion since prior study. Electronically Signed   By: Randa Ngo M.D.   On: 01/03/2020 18:20   ECHOCARDIOGRAM COMPLETE  Result Date: 01/05/2020    ECHOCARDIOGRAM REPORT   Patient Name:   Toni Griffith KZSWF Date of Exam: 01/04/2020 Medical Rec #:  [id number]            Height:       63.0 in Accession #:    [id number]           Weight:       173.1 lb Date of Birth:  [birth year]            BSA:          1.818 m Patient Age:    79 years             BP:           127/76 mmHg Patient Gender: F                    HR:           100 bpm. Exam Location:  [facility] Procedure: 2D Echo Indications:     Atrial Fibrillation  History:         Patient has prior history of Echocardiogram examinations. Risk                  Factors:Hypertension.  Sonographer:     L Thornton-Maynard Referring Phys:  [id number] [place] Diagnosing Phys: Kathlyn Sacramento MD IMPRESSIONS  1. Left ventricular ejection fraction, by estimation, is 60 to 65%. The left ventricle has normal function. The left ventricle has no regional wall motion abnormalities. There is mild left ventricular hypertrophy. Left ventricular diastolic parameters are indeterminate.  2. Right ventricular systolic function is normal. The right ventricular size is normal. There is moderately elevated pulmonary artery systolic pressure. The estimated right ventricular systolic pressure is 15.1 mmHg.  3. The mitral valve is normal in structure. Mild mitral valve regurgitation. No evidence of mitral stenosis.  4. Tricuspid valve regurgitation is mild to moderate.  5. The aortic valve is normal in structure. Aortic valve regurgitation is not  visualized. Mild aortic valve sclerosis is present, with no evidence of aortic valve stenosis.  6. Mildly dilated pulmonary artery.  7. The inferior vena cava is normal in size with greater than 50% respiratory variability, suggesting right atrial pressure of  3 mmHg. FINDINGS  Left Ventricle: Left ventricular ejection fraction, by estimation, is 60 to 65%. The left ventricle has normal function. The left ventricle has no regional wall motion abnormalities. The left ventricular internal cavity size was normal in size. There is  mild left ventricular hypertrophy. Left ventricular diastolic parameters are indeterminate. Right Ventricle: The right ventricular size is normal. No increase in right ventricular wall thickness. Right ventricular systolic function is normal. There is moderately elevated pulmonary artery systolic pressure. The tricuspid regurgitant velocity is 3.49 m/s, and with an assumed right atrial pressure of 5 mmHg, the estimated right ventricular systolic pressure is 62.9 mmHg. Left Atrium: Left atrial size was normal in size. Right Atrium: Right atrial size was normal in size. Pericardium: There is no evidence of pericardial effusion. Mitral Valve: The mitral valve is normal in structure. Normal mobility of the mitral valve leaflets. Mild mitral valve regurgitation. No evidence of mitral valve stenosis. MV peak gradient, 4.5 mmHg. The mean mitral valve gradient is 2.0 mmHg. Tricuspid Valve: The tricuspid valve is normal in structure. Tricuspid valve regurgitation is mild to moderate. No evidence of tricuspid stenosis. Aortic Valve: The aortic valve is normal in structure. Aortic valve regurgitation is not visualized. Mild aortic valve sclerosis is present, with no evidence of aortic valve stenosis. Aortic valve mean gradient measures 8.0 mmHg. Aortic valve peak gradient measures 12.5 mmHg. Aortic valve area, by VTI measures 1.65 cm. Pulmonic Valve: The pulmonic valve was normal in structure. Pulmonic  valve regurgitation is mild. No evidence of pulmonic stenosis. Aorta: The aortic root is normal in size and structure. Pulmonary Artery: The pulmonary artery is mildly dilated. Venous: The inferior vena cava is normal in size with greater than 50% respiratory variability, suggesting right atrial pressure of 3 mmHg. IAS/Shunts: No atrial level shunt detected by color flow Doppler.  LEFT VENTRICLE PLAX 2D LVIDd:         4.39 cm  Diastology LVIDs:         2.58 cm  LV e' lateral:   7.72 cm/s LV PW:         1.16 cm  LV E/e' lateral: 6.0 LV IVS:        1.24 cm  LV e' medial:    7.40 cm/s LVOT diam:     2.20 cm  LV E/e' medial:  6.3 LV SV:         48 LV SV Index:   26 LVOT Area:     3.80 cm  RIGHT VENTRICLE RV S prime:     12.60 cm/s TAPSE (M-mode): 1.8 cm LEFT ATRIUM             Index LA diam:        3.70 cm 2.03 cm/m LA Vol (A2C):   62.9 ml 34.59 ml/m LA Vol (A4C):   48.8 ml 26.84 ml/m LA Biplane Vol: 59.4 ml 32.67 ml/m  AORTIC VALVE                    PULMONIC VALVE AV Area (Vmax):    1.77 cm     PV Vmax:       1.19 m/s AV Area (Vmean):   1.61 cm     PV Peak grad:  5.7 mmHg AV Area (VTI):     1.65 cm AV Vmax:           177.00 cm/s AV Vmean:          137.000 cm/s AV VTI:  0.291 m AV Peak Grad:      12.5 mmHg AV Mean Grad:      8.0 mmHg LVOT Vmax:         82.20 cm/s LVOT Vmean:        58.200 cm/s LVOT VTI:          0.126 m LVOT/AV VTI ratio: 0.43  AORTA Ao Root diam: 3.40 cm MITRAL VALVE               TRICUSPID VALVE MV Peak grad: 4.5 mmHg     TR Peak grad:   48.7 mmHg MV Mean grad: 2.0 mmHg     TR Vmax:        349.00 cm/s MV Vmax:      1.06 m/s MV Vmean:     72.6 cm/s    SHUNTS MV E velocity: 46.70 cm/s  Systemic VTI:  0.13 m MV A velocity: 76.70 cm/s  Systemic Diam: 2.20 cm MV E/A ratio:  0.61 Kathlyn Sacramento MD Electronically signed by Kathlyn Sacramento MD Signature Date/Time: 01/05/2020/9:10:20 AM    Final      Assessment and Recommendation  79 y.o. female with lung cancer known coronary disease status  post coronary bypass graft hypertension hyperlipidemia peripheral vascular disease with atrial flutter with rapid ventricular rate likely secondary to hypoxia and COPD exacerbation without evidence of congestive heart failure or myocardial infarction 1.  Continue heart rate control with current medical regimen of metoprolol and 50 mg twice per day with no change today 2.  Okay for change over to Eliquis for further risk reduction in stroke with atrial flutter watching closely for any concerns of bleeding or bruising complications 3.  No further cardiac diagnostics necessary at this time 4.  Continuation of supportive care of hypoxia and lung cancer 5.  Okay for discharge to home from cardiac standpoint with heart rate control and anticoagulation with follow-up in the future for adjustments of medication management  Signed, Serafina Royals M.D. FACC

## 2020-01-06 NOTE — Progress Notes (Signed)
Pt with no further complaints for chest pain

## 2020-01-06 NOTE — Progress Notes (Signed)
[facility] [place] NAME: Toni Griffith    MR#:  [id number]  DATE OF BIRTH:  [birth year]  SUBJECTIVE:   Sign language interpretation. dter patty on Facetime  Patient came in with swelling and burning of her lower lips. Much improved able to swallow no tongue swelling. Had some shortness of breath.   Patient had episode of vomiting this morning. Although she did keep her PO meds and per RN. Overall she felt nauseated received some IV Zofran. Transiently her heart rate went to the 130s but currently 92- 100.  Patient has had constipation for about a week. During my conversation she all of a sudden had a large to have a bowel movement. Call nurse tech to help her out have a bowel movement.  Discussed with daughter that physical therapy will work with her later today. REVIEW OF SYSTEMS:   Review of Systems  Unable to perform ROS: Other  pt nonverbal Tolerating Diet:yes Tolerating PT:   DRUG ALLERGIES:  No Known Allergies  VITALS:  Blood pressure 135/82, pulse 93, temperature 98 F (36.7 C), temperature source Oral, resp. rate 20, height 5\' 3"  (1.6 m), weight 78.1 kg, SpO2 98 %.  PHYSICAL EXAMINATION:   Physical Exam  GENERAL:  79 y.o.-year-old patient lying in the bed with no acute distress.  EYES: Pupils equal, round, reactive to light and accommodation. No scleral icterus.   HEENT: Head atraumatic, normocephalic. Oropharynx and nasopharynx clear.  NECK:  Supple, no jugular venous distention. No thyroid enlargement, no tenderness.  LUNGS: Normal breath sounds bilaterally, no wheezing, rales, rhonchi. No use of accessory muscles of respiration. Port+ CARDIOVASCULAR: S1, S2 normal. No murmurs, rubs, or gallops. tachycardia ABDOMEN: Soft, nontender, nondistended. Bowel sounds present. No organomegaly or mass.  EXTREMITIES: No cyanosis, clubbing or edema b/l.    NEUROLOGIC: Cranial nerves II through XII are intact. No focal Motor or  sensory deficits b/l.   PSYCHIATRIC:  patient is alert and oriented x 3. mute SKIN: No obvious rash, lesion, or ulcer.   LABORATORY PANEL:  CBC Recent Labs  Lab 01/06/20 0254 01/06/20 0254 01/06/20 1040  WBC 13.0*  --   --   HGB 8.4*   < > 8.9*  HCT 25.7*  --   --   PLT 273  --   --    < > = values in this interval not displayed.    Chemistries  Recent Labs  Lab 01/04/20 0332  NA 132*  K 4.5  CL 99  CO2 27  GLUCOSE 131*  BUN 20  CREATININE 0.95  CALCIUM 9.2  MG 1.8  AST 21  ALT 14  ALKPHOS 78  BILITOT 1.6*   Cardiac Enzymes No results for input(s): TROPONINI in the last 168 hours. RADIOLOGY:  No results found. ASSESSMENT AND PLAN:  Toni Griffith  is a 79 y.o. female, with history of squamous cell lung cancer, hypertension, GERD, hearing impairment, and arthritis presents to the ER with daughter for burning, swollen lips and mouth.  Daughter is interpreting American sign language.  They both report that patient had started 2 medications 3 days ago.  The first was an Atrovent inhaler, and the second was a new chemo infusion.  Yesterday, patient started to notice swollen burning lips.   #Acute hypoxic respiratory failuret likely secondary to edema versus infiltrate versus worsening lung mass -Lasix and cefepime started in ED--d/c lasix -Continue cefepime, Procalcitonin 0.58--change to oral abxs withelevated wbc and h/o cancer cont  abxs for total 7 days -Continue 3 L nasal cannula--wean down as tolerated(does not wear at home) -assess for home oxygen use -CT chest negative for PE  Progression of disease, with enlarging left hilar mass and right lower lobe pulmonary nodule.Stable multilocular left pleural effusion.Trace right pleural effusion -Oncology to see pt--seen by Dr Mike Gip-- pt will resume cancer rx as out pt -pt was seen by [facility] care Josh--FULL code for now and family will discuss furhter with dr Mike Gip  Sepsis secondary to pneumonia in  immunocompromised patient--POA -resolved -Recently finished Levaquin outpatient -Worsening findings on chest x-ray appears due to lung mass--cannot rule out underlying infection -Continue cefepime--change to po cefdinir -BC negative  #A. fib with RVR in the setting of above--new onset -Likely secondary to pulmonary disease -Echo noted CHA2DS2-VASc 5, IV heparin drip--now on po eliquis per cardiology rec -pt on IV metoprolol--change to oral BB 50 mg bid - cardiology consult with Dr Nehemiah Massed -defer long term anticoagulation with Cardiology--recommends to start po DOAC's  # constipation with Nausea and vomiting today -pt has had the urge to have BM today--will consider bowel regimen if needed  #Oral ulcer - Magic mouthwash with lidocaine  #CHF acute likley diastolic (echo in 2841 EF 55-60%) -Given soft blood pressures (systolic in the 32G) holding off on further diuresis -Monitoring I's and O's -Daily weights -Cardiology consult--Dr Nehemiah Massed -uop 1050cc -appears Euvolemic  #Protein calorie malnutrition 1. Encourage protein shake between meals 2. Dietitian to see pt  PT to see pt   DVT Prophylaxis-  Eliquis AM Labs Ordered, also please review Full Orders  Family Communication: Admission, patients condition and plan of care including tests being ordered have been discussed with the patient and  Daughter who indicate understanding and agree with the plan. Code Status:  Full  Admission status: inpatient  Procedures:none Family communication :family dter patty on FT Consults :Oncology CODE STATUS: FULL    Dispo: The patient is from: Home              Anticipated d/c is to: Home              Anticipated d/c date is: 2 days              Patient currently is not medically stable to d/c. Cardiology consult done . PT pending Pt having nausea and vomiting       TOTAL TIME TAKING CARE OF THIS PATIENT: 25 minutes.  >50% time spent on counselling and coordination  of care  Note: This dictation was prepared with Dragon dictation along with smaller phrase technology. Any transcriptional errors that result from this process are unintentional.  Fritzi Mandes M.D    [facility]   CC: Primary care physician; Francesca Oman, DOPatient ID: Toni Griffith, female   DOB: March 18, 1941, 79 y.o.   MRN: [id number]

## 2020-01-06 NOTE — Progress Notes (Signed)
ANTICOAGULATION CONSULT NOTE -  Pharmacy Consult for heparin Indication: atrial fibrillation  No Known Allergies  Patient Measurements: Height: 5\' 3"  (160 cm) Weight: 78.1 kg (172 lb 2.9 oz) IBW/kg (Calculated) : 52.4 Heparin Dosing Weight: 61 kg  Vital Signs: Temp: 97.6 F (36.4 C) (06/07 2345) Temp Source: Axillary (06/07 2345) BP: 117/74 (06/07 2345) Pulse Rate: 85 (06/07 2345)  Labs: Recent Labs    01/03/20 1833 01/03/20 1833 01/04/20 0257 01/04/20 0332 01/04/20 1220 01/04/20 1357 01/05/20 0819 01/05/20 1714 01/05/20 2011 01/05/20 2127 01/06/20 0254  HGB 9.8*   < >  --  9.6*  --   --   --   --   --   --  8.4*  HCT 29.6*  --   --  29.3*  --   --   --   --   --   --  25.7*  PLT 363  --   --  361  --   --   --   --   --   --  273  APTT  --   --  >160*  --  >160*  --   --   --   --   --   --   LABPROT  --   --  18.4*  --   --   --   --   --   --   --   --   INR  --   --  1.6*  --   --   --   --   --   --   --   --   HEPARINUNFRC  --   --   --   --  1.52*   < > 0.14* 0.25*  --   --  0.36  CREATININE 0.79  --   --  0.95  --   --   --   --   --   --   --   TROPONINIHS 14   < > 21* 19*  --   --   --   --  14 13  --    < > = values in this interval not displayed.    Estimated Creatinine Clearance: 49.9 mL/min (by C-G formula based on SCr of 0.95 mg/dL).   Medical History: Past Medical History:  Diagnosis Date  . Arthritis   . [facility]    lymphoma-right hip  . Deaf   . GERD (gastroesophageal reflux disease)   . Hypertension    NOT ON MED AT PRESENT  . Squamous cell lung cancer (HCC)     Medications:  Scheduled:  . albuterol  2.5 mg Nebulization Q6H  . aspirin EC  81 mg Oral Daily  . atorvastatin  80 mg Oral QPM  . cefdinir  300 mg Oral Q12H  . Chlorhexidine Gluconate Cloth  6 each Topical Daily  . cholecalciferol  1,000 Units Oral Daily  . clotrimazole   Topical BID  . DULoxetine  60 mg Oral Daily  . famotidine  20 mg Oral BID  . feeding  supplement (ENSURE ENLIVE)  237 mL Oral BID  . heparin  2,000 Units Intravenous Once  . magic mouthwash  15 mL Oral TID  . mouth rinse  15 mL Mouth Rinse BID  . metoprolol tartrate  50 mg Oral BID  . multivitamin with minerals  1 tablet Oral Daily  . omega-3 acid ethyl esters  1 g Oral Daily  . pantoprazole  40 mg Oral  Daily  . polyethylene glycol  17 g Oral BID    Assessment: Patient arrives x c/o burning/swollen lips w/ h/o squam cell lung CA s/p chemo tx, GERD, COPD w/ pending sleep study, deafness, arrives to ED in afib/aflutter w/ RVR on EKG, troponin WNL, CBC WNL for patient's clinical scenario, but also has elevated WBC w/ left shift and increased vascular congestion on CXR. Baseline aPTT/INR pending, no anticoagulation PTA. Patient is being started on heparin drip for management of new onset afib w/ a CHADS-VASc score of 4 (HTN, age > 74, female).   Goal of Therapy:  Heparin level 0.3-0.7 units/ml Monitor platelets by anticoagulation protocol: Yes   Plan:  06/08 @ 0254 HL 0.36 therapeutic x 1.  Will continue heparin rate at 1150 units/hr and will recheck HL in 8 hours to confirm.   -H/H lower, PLTs stable will continue to monitor.  Ena Dawley, PharmD Clinical Pharmacist 01/06/2020 3:31 AM

## 2020-01-07 DIAGNOSIS — E440 Moderate protein-calorie malnutrition: Secondary | ICD-10-CM | POA: Insufficient documentation

## 2020-01-07 LAB — CBC
HCT: 26.6 % — ABNORMAL LOW (ref 36.0–46.0)
Hemoglobin: 8.7 g/dL — ABNORMAL LOW (ref 12.0–15.0)
MCH: 31.9 pg (ref 26.0–34.0)
MCHC: 32.7 g/dL (ref 30.0–36.0)
MCV: 97.4 fL (ref 80.0–100.0)
Platelets: 220 10*3/uL (ref 150–400)
RBC: 2.73 MIL/uL — ABNORMAL LOW (ref 3.87–5.11)
RDW: 17.9 % — ABNORMAL HIGH (ref 11.5–15.5)
WBC: 5.8 10*3/uL (ref 4.0–10.5)
nRBC: 0.5 % — ABNORMAL HIGH (ref 0.0–0.2)

## 2020-01-07 LAB — CREATININE, SERUM
Creatinine, Ser: 0.71 mg/dL (ref 0.44–1.00)
GFR calc Af Amer: >60 mL/min (ref >60)
GFR calc non Af Amer: >60 mL/min (ref >60)

## 2020-01-07 MED ORDER — DICLOFENAC SODIUM 1 % EX GEL
2.0000 g | Freq: Three times a day (TID) | CUTANEOUS | Status: DC | PRN
  Filled 2020-01-07: qty 100

## 2020-01-07 MED ORDER — BOOST / RESOURCE BREEZE PO LIQD CUSTOM
1.0000 | Freq: Three times a day (TID) | ORAL | Status: DC
  Administered 2020-01-08 – 2020-01-09 (×5): 1 via ORAL

## 2020-01-07 MED ORDER — FLEET ENEMA 7-19 GM/118ML RE ENEM: 1.0000 | ENEMA | Freq: Once | RECTAL | Status: DC

## 2020-01-07 MED ORDER — FLEET ENEMA 7-19 GM/118ML RE ENEM
1.0000 | ENEMA | Freq: Once | RECTAL | Status: AC
  Administered 2020-01-07: 23:00:00 1 via RECTAL

## 2020-01-07 NOTE — Progress Notes (Signed)
[facility] NAME: Toni Griffith    MR#:  [id number]  DATE OF BIRTH:  1940-11-19  SUBJECTIVE:   Sign language interpretation. dter patty on Facetime  Patient came in with swelling and burning of her lower lips. Much improved able to swallow no tongue swelling. Had some shortness of breath.   C/o nausea. Does not want to go home today Ate some cereal, wants french toast Refused to take supoository and enema yday. Had a very small BM  Patient has had constipation for about a week. During my conversation she all of a sudden had a large to have a bowel movement.   Discussed with daughter to convince pt to take bowel prep--pt now agreeable REVIEW OF SYSTEMS:   Review of Systems  Unable to perform ROS: Other  pt nonverbal Tolerating Diet:yes Tolerating PT:   DRUG ALLERGIES:  No Known Allergies  VITALS:  Blood pressure (!) 142/81, pulse 70, temperature 98.2 F (36.8 C), temperature source Oral, resp. rate (!) 22, height 5\' 3"  (1.6 m), weight 79.4 kg, SpO2 95 %.  PHYSICAL EXAMINATION:   Physical Exam  GENERAL:  79 y.o.-year-old patient lying in the bed with no acute distress.  EYES: Pupils equal, round, reactive to light and accommodation. No scleral icterus.   HEENT: Head atraumatic, normocephalic. Oropharynx and nasopharynx clear.  NECK:  Supple, no jugular venous distention. No thyroid enlargement, no tenderness.  LUNGS: Normal breath sounds bilaterally, no wheezing, rales, rhonchi. No use of accessory muscles of respiration. Port+ CARDIOVASCULAR: S1, S2 normal. No murmurs, rubs, or gallops. tachycardia ABDOMEN: Soft, nontender, nondistended. Bowel sounds present. No organomegaly or mass.  EXTREMITIES: No cyanosis, clubbing or edema b/l.    NEUROLOGIC: Cranial nerves II through XII are intact. No focal Motor or sensory deficits b/l.   PSYCHIATRIC:  patient is alert and oriented x 3. mute SKIN: No obvious rash, lesion, or  ulcer.   LABORATORY PANEL:  CBC Recent Labs  Lab 01/07/20 0531  WBC 5.8  HGB 8.7*  HCT 26.6*  PLT 220    Chemistries  Recent Labs  Lab 01/04/20 0332 01/04/20 0332 01/07/20 0531  NA 132*  --   --   K 4.5  --   --   CL 99  --   --   CO2 27  --   --   GLUCOSE 131*  --   --   BUN 20  --   --   CREATININE 0.95   < > 0.71  CALCIUM 9.2  --   --   MG 1.8  --   --   AST 21  --   --   ALT 14  --   --   ALKPHOS 78  --   --   BILITOT 1.6*  --   --    < > = values in this interval not displayed.   Cardiac Enzymes No results for input(s): TROPONINI in the last 168 hours. RADIOLOGY:  No results found. ASSESSMENT AND PLAN:  Charlott Calvario  is a 79 y.o. female, with history of squamous cell lung cancer, hypertension, GERD, hearing impairment, and arthritis presents to the ER with daughter for burning, swollen lips and mouth.  Daughter is interpreting American sign language.  They both report that patient had started 2 medications 3 days ago.  The first was an Atrovent inhaler, and the second was a new chemo infusion.  Yesterday, patient started to notice swollen burning lips.   #  Acute hypoxic respiratory failuret likely secondary to worsening lung mass -Lasix and cefepime started in ED--d/c lasix -Continue cefepime, Procalcitonin 0.58--change to oral abxs withelevated wbc and h/o cancer cont abxs for total 7 days -Continue 3 L nasal cannula--wean down as tolerated(does not wear at home) -assess for home oxygen use -CT chest negative for PE  Progression of disease, with enlarging left hilar mass and right lower lobe pulmonary nodule.Stable multilocular left pleural effusion.Trace right pleural effusion -Oncology ---seen by Dr Mike Gip-- pt will resume cancer rx as out pt -pt was seen by Palla[facility] care Josh--FULL code for now and family will discuss furhter with dr Mike Gip  Sepsis secondary to pneumonia in immunocompromised patient--POA -resolved -Recently finished Levaquin  outpatient -Worsening findings on chest x-ray appears due to lung mass--cannot rule out underlying infection -Continue cefepime--changed to po cefdinir -BC negative  #A. fib with RVR in the setting of above--new onset -Likely secondary to pulmonary disease -Echo noted CHA2DS2-VASc 5, IV heparin drip--now on po eliquis per cardiology rec -pt on IV metoprolol--changed to oral BB 50 mg bid - cardiology consult with Dr Nehemiah Massed -defer long term anticoagulation with Cardiology--recommends to start po DOAC's  # constipation with Nausea and vomiting today -pt has had the urge to have BM today -suppository and enema  #Oral ulcer - Magic mouthwash with lidocaine  #CHF acute likley diastolic (echo in 2706 EF 55-60%) -Given soft blood pressures (systolic in the 23J) holding off on further diuresis -Monitoring I's and O's -Daily weights -Cardiology consult--Dr Nehemiah Massed -uop 1050cc -appears Euvolemic  #Protein calorie malnutrition-mild Encourage protein shake between meals  PT recommends rehab--family wants to take her home. Palliative care consultation done--FULL code  DVT Prophylaxis-  Eliquis Family Communication: dter patty Code Status:  Full Admission status: inpatient  Procedures:none Family communication :family dter patty on facetime Consults :Oncology, cardiology CODE STATUS: FULL    Dispo: The patient is from: Home              Anticipated d/c is to: Home with [facility]              Anticipated d/c date is: 1 day              Patient currently is stable for discharge however she wants to stay one more day since she is constipated and nauseous  TOTAL TIME TAKING CARE OF THIS PATIENT: 25 minutes.  >50% time spent on counselling and coordination of care  Note: This dictation was prepared with Dragon dictation along with smaller phrase technology. Any transcriptional errors that result from this process are unintentional.  Fritzi Mandes M.D    [facility]    CC: Primary care physician; Francesca Griffith, DOPatient ID: Toni Griffith, female   DOB: 10-Nov-1940, 79 y.o.   MRN: [id number]

## 2020-01-07 NOTE — TOC Progression Note (Signed)
Transition of Care [facility]) - Progression Note    Patient Details  Name: Toni Griffith MRN: [id number] Date of Birth: 10/23/1940  Transition of Care [facility]) CM/SW Contact  Shelbie Hutching, RN Phone Number: 01/07/2020, 3:28 PM  Clinical Narrative:    Patient has all needed DME at home.  Patient agrees to having home health services set up and Outpatient palliative with [facility].  Margaretmary Eddy with [facility] accepted referral for outpatient palliative and Helene Kelp with [facility] has accepted referral for home health services.  Patient has used [facility] in the past but [facility] is unable to accept referral due to staffing.  This RNCM will follow up with patient and family tomorrow with resources for the Deaf in [place].     Expected Discharge Plan: [place] Barriers to Discharge: Continued Medical Work up  Expected Discharge Plan and Services Expected Discharge Plan: [place]   Discharge Planning Services: CM Consult Post Acute Care Choice: [place] arrangements for the past 2 months: Single Family Home                           HH Arranged: PT, OT, Nurse's Aide [place] Agency: [facility] ([facility]) Date [place]: 01/07/20 Time [place]: [place] Representative spoke with at [place]: [place] (SDOH) Interventions    Readmission Risk Interventions Readmission Risk Prevention Plan 01/05/2020  Transportation Screening Complete  PCP or Specialist Appt within 3-5 Days Complete  HRI or Home Care Consult Complete  Medication Review (RN Care Manager) Complete  Some recent data might be hidden

## 2020-01-07 NOTE — Care Management Important Message (Signed)
Important Message  Patient Details  Name: Toni Griffith MRN: [id number] Date of Birth: 25-Oct-1940   Medicare Important Message Given:  Yes     Juliann Pulse A Jahn Franchini 01/07/2020, 12:04 PM

## 2020-01-07 NOTE — Progress Notes (Signed)
Initial Nutrition Assessment  DOCUMENTATION CODES:   Non-severe (moderate) malnutrition in context of chronic illness  INTERVENTION:  Downgrade diet to dysphagia 3 with thin liquids.  Will discontinue Ensure Enlive as patient is not tolerating them well (does enjoy the flavor but not able to keep down).  Provide Boost Breeze po TID, each supplement provides 250 kcal and 9 grams of protein.  Provide Magic cup TID with meals, each supplement provides 290 kcal and 9 grams of protein.  Provided "Making the Most of Each Bite" handout from the Academy of Nutrition and Dietetics. Encouraged intake of small, frequent meals. Discussed ways to increase calorie and protein intake.  NUTRITION DIAGNOSIS:   Moderate Malnutrition related to chronic illness(stage IV grazy zone lymphoma, stage IV SCC of lung) as evidenced by moderate fat depletion, moderate muscle depletion, 8.6% weight loss over 2 months.  GOAL:   Patient will meet greater than or equal to 90% of their needs  MONITOR:   PO intake, Supplement acceptance, Labs, Weight trends, I & O's  REASON FOR ASSESSMENT:   Malnutrition Screening Tool    ASSESSMENT:   79 year old female with PMHx of HTN, GERD, arthritis, hearing impairment, stage IV gray zone lymphoma, stage IV SCC of lung who is admitted with acute hypoxic respiratory failure likely secondary to worsening lung mass, sepsis, A-fib with RVR, constipation with nausea and vomiting, oral ulcer, acute CHF.   Met with patient at bedside. Used [employer] video interpreter for [employer] (interpreter [id number]). Patient also called her daughter through Carolee Rota so she could be present during nutrition assessment virtually. Patient reports she has had a decreased appetite for a while now. She reports it is related to her cancer and nausea/vomiting. She also endorses difficulty chewing as she only has her top dentures and does not have any bottom dentures. Patient reports she  needs foods that are easy to chew. She is not eating any full meals at this time. She reports she is eating small amounts of her meals and sometimes only bites. She can tolerate applesauce, yogurt, and other soft foods. She reports she enjoys the flavor of Ensure but is having nausea with the Ensure and cannot keep them down. She is willing to try Boost Breeze and Magic Cup to see if she tolerates better. Discussing eating small meals more often throughout the day.   Patient's UBW was 170-180 lbs. Daughter reports patient had lost down to 163 lbs. According to chart patient was 81.2 kg on 11/03/2019. She was 74.2 kg on 01/01/2020. That is a weight loss of 7 kg (8.6% body weight) over 2 months, which is significant for time frame. Current weight is documented as 79.4 kg (175.05 lbs), so unsure which weight is more accurate.  Medications reviewed and include: Dulcolax, vitamin D3 1000 units daily, famotidine, MVI, Lovaza 1 gram daily, Protonix, Miralax 17 grams BID.  Labs reviewed.  NUTRITION - FOCUSED PHYSICAL EXAM:    Most Recent Value  Orbital Region  Moderate depletion  Upper Arm Region  Moderate depletion  Thoracic and Lumbar Region  No depletion  Buccal Region  Moderate depletion  Temple Region  Moderate depletion  Clavicle Bone Region  Moderate depletion  Clavicle and Acromion Bone Region  Mild depletion  Scapular Bone Region  Unable to assess  Dorsal Hand  Severe depletion  Patellar Region  Unable to assess  Anterior Thigh Region  Unable to assess  Posterior Calf Region  Unable to assess  Edema (RD Assessment)  Mild  Hair  Reviewed  Eyes  Reviewed  Mouth  Reviewed [no bottom dentures, has upper dentures]  Skin  Reviewed  Nails  Reviewed     Diet Order:   Diet Order            Diet Heart Room service appropriate? Yes; Fluid consistency: Thin; Fluid restriction: 1800 mL Fluid  Diet effective now             EDUCATION NEEDS:   Education needs have been addressed  Skin:   Skin Assessment: Reviewed RN Assessment  Last BM:  01/06/2020 - small type 1  Height:   Ht Readings from Last 1 Encounters:  01/04/20 '5\' 3"'  (1.6 m)   Weight:   Wt Readings from Last 1 Encounters:  01/07/20 79.4 kg   BMI:  Body mass index is 31.01 kg/m.  Estimated Nutritional Needs:   Kcal:  1900-2100  Protein:  95-105 grams  Fluid:  1.9 L/day  Jacklynn Barnacle, MS, RD, LDN Pager number available on Amion

## 2020-01-07 NOTE — Progress Notes (Signed)
Patient ID: Toni Griffith, female   DOB: 1941/06/21, 79 y.o.   MRN: [id number]   Spoke with dter [name] and she wants to consider Rehab for pt. TOC consulted and message sent to start process

## 2020-01-08 ENCOUNTER — Inpatient Hospital Stay: Payer: Medicare HMO

## 2020-01-08 ENCOUNTER — Inpatient Hospital Stay: Payer: Medicare HMO | Admitting: Hematology and Oncology

## 2020-01-08 LAB — SARS CORONAVIRUS 2 BY RT PCR (HOSPITAL ORDER, PERFORMED IN ~~LOC~~ HOSPITAL LAB): SARS Coronavirus 2: NEGATIVE

## 2020-01-08 MED ORDER — ONDANSETRON 4 MG PO TBDP
4.0000 mg | ORAL_TABLET | Freq: Three times a day (TID) | ORAL | Status: DC | PRN
  Administered 2020-01-08: 14:00:00 4 mg via ORAL
  Filled 2020-01-08 (×4): qty 1

## 2020-01-08 NOTE — TOC Progression Note (Signed)
Transition of Care [facility]) - Progression Note    Patient Details  Name: Toni Griffith MRN: [id number] Date of Birth: 1941-05-25  Transition of Care [facility]) CM/SW Contact  Shelbie Hutching, RN Phone Number: 01/08/2020, 12:55 PM  Clinical Narrative:    Patient and patient's family has decided that she would benefit from some skilled short term rehab.  Patient's daughter prefers [facility] [employer] started, [facility] started.  Patient managed by [facility]- reference number is [phone].   Expected Discharge Plan: [place] Barriers to Discharge: Continued Medical Work up  Expected Discharge Plan and Services Expected Discharge Plan: [place]   Discharge Planning Services: CM Consult Post Acute Care Choice: [place] Living arrangements for the past 2 months: Single Family Home                           HH Arranged: PT, OT, Nurse's Aide [place] Agency: [facility] ([facility]) Date [place]: 01/07/20 Time [place]: [place] Representative spoke with at [place]: [place] ([place]) Interventions    Readmission Risk Interventions Readmission Risk Prevention Plan 01/05/2020  Transportation Screening Complete  PCP or Specialist Appt within 3-5 Days Complete  HRI or Home Care Consult Complete  Medication Review (RN Care Manager) Complete  Some recent data might be hidden

## 2020-01-08 NOTE — NC FL2 (Signed)
[place] LEVEL OF CARE SCREENING TOOL     IDENTIFICATION  Patient Name: Toni Griffith Birthdate: 18-Mar-1941 Sex: female Admission Date (Current Location): 01/03/2020  [facility] and [place] Number:  Engineering geologist and Address:  [facility], [address], [place], [place] [address]      Provider Number: [id number]  Attending Physician Name and Address:  Fritzi Mandes, MD  Relative Name and Phone Number:  [name]- daughter  [phone]    Current Level of Care: Hospital Recommended Level of Care: [place] Prior Approval Number:    Date Approved/Denied:   PASRR Number: [id number]  Discharge Plan: SNF    Current Diagnoses: Patient Active Problem List   Diagnosis Date Noted   Malnutrition of moderate degree 01/07/2020   Palliative care encounter    Sepsis with acute hypoxic respiratory failure ([place])    Lung mass    Mucositis    Cheilitis    Community acquired pneumonia of right lung 01/03/2020   Congestive heart failure ([place])    Cancer-related pain 12/21/2019   Chills 12/10/2019   Tachycardia 12/10/2019   Tachypnea 12/10/2019   Pleural effusion, left 12/03/2019   Hypomagnesemia 10/18/2019   Tinnitus of both ears 09/05/2019   B12 deficiency 08/29/2019   Arthritis of both knees 04/11/2019   Primary osteoarthritis of both knees 12/22/2018   Mediastinal adenopathy    Encounter for antineoplastic chemotherapy 05/16/2018   Encounter for antineoplastic immunotherapy 05/16/2018   Hyponatremia 02/01/2018   Chronic pain of left knee 12/23/2017   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   Pain of both hip joints 11/24/2017   Encounter for monoclonal antibody treatment for malignancy 11/23/2017   Goals of care, counseling/discussion 11/01/2017   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[place]) 11/01/2017   Osteoarthritis of left knee 04/27/2017    Malignant neoplasm of lung ([place]) 09/05/2016   Hypokalemia 04/29/2016   Neuropathy associated with cancer ([place]) 04/13/2016   Hypercalcemia 03/30/2016   Anemia 03/30/2016   Non-Hodgkin's lymphoma ([place]) 03/08/2016   Hodgkin's lymphoma ([place]) 03/08/2016   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 ([place]) 03/08/2016   Bone metastasis ([place]) 03/03/2016   Stage 3 chronic kidney disease 03/01/2016   Gait abnormality 02/24/2016   Nontoxic single thyroid nodule 02/21/2016   History of coronary artery bypass graft x 2 02/20/2016   Fatigue 02/20/2016   Hiatal hernia 02/04/2016   Esophageal dysphagia 12/31/2015   Restless leg syndrome 12/28/2015   Obesity 12/28/2015   Mixed hyperlipidemia 12/28/2015   GERD without esophagitis 12/28/2015   Essential hypertension 12/28/2015   Simple chronic bronchitis ([place]) 10/14/2015   Coronary artery disease involving nonautologous biological coronary 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 10/14/2015   Congenital deafness 10/14/2015    Orientation RESPIRATION BLADDER Height & Weight     Self, Time, Situation, Place  O2 (2L [place]) Continent Weight: 80 kg Height:  5\' 3"  (160 cm)  BEHAVIORAL SYMPTOMS/MOOD NEUROLOGICAL BOWEL NUTRITION STATUS      Continent Diet (see discharge summary)  AMBULATORY STATUS COMMUNICATION OF NEEDS Skin   Extensive Assist Non-Verbally (American Sign Language and can write) Normal                       Personal Care Assistance Level of Assistance  Bathing, Feeding, Dressing Bathing Assistance: Limited assistance Feeding assistance: Limited assistance Dressing Assistance: Limited assistance     Functional Limitations Info  SPECIAL CARE FACTORS FREQUENCY  PT (By licensed PT), OT (By licensed OT)     PT Frequency: 5 times per week OT Frequency: 5 times per week            Contractures Contractures Info: Not present    Additional Factors Info  Code Status,  Allergies Code Status Info: Full Allergies Info: NKA           Current Medications (01/08/2020):  This is the current hospital active medication list Current Facility-Administered Medications  Medication Dose Route Frequency Provider Last Rate Last Admin   albuterol (PROVENTIL) (2.5 MG/3ML) 0.083% nebulizer solution 2.5 mg  2.5 mg Nebulization Q6H Zierle-Ghosh, Asia B, DO   2.5 mg at 01/08/20 0732   alum & mag hydroxide-simeth (MAALOX/MYLANTA) 200-200-20 MG/5ML suspension 30 mL  30 mL Oral Q4H PRN Sharion Settler, NP   30 mL at 01/05/20 2016   apixaban (ELIQUIS) tablet 5 mg  5 mg Oral BID Lu Duffel, RPH   5 mg at 01/08/20 1610   aspirin EC tablet 81 mg  81 mg Oral Daily Zierle-Ghosh, Asia B, DO   81 mg at 01/08/20 0946   atorvastatin (LIPITOR) tablet 80 mg  80 mg Oral QPM Zierle-Ghosh, Asia B, DO   80 mg at 01/07/20 1715   bisacodyl (DULCOLAX) EC tablet 10 mg  10 mg Oral Daily Fritzi Mandes, MD   10 mg at 01/08/20 0947   bisacodyl (DULCOLAX) suppository 10 mg  10 mg Rectal Daily Fritzi Mandes, MD   10 mg at 01/07/20 1037   cefdinir (OMNICEF) capsule 300 mg  300 mg Oral Q12H Fritzi Mandes, MD   300 mg at 01/08/20 9604   Chlorhexidine Gluconate Cloth 2 % PADS 6 each  6 each Topical Daily Zierle-Ghosh, Asia B, DO   6 each at 01/08/20 0950   cholecalciferol (VITAMIN D3) tablet 1,000 Units  1,000 Units Oral Daily Zierle-Ghosh, Asia B, DO   1,000 Units at 01/08/20 0948   clotrimazole (LOTRIMIN) 1 % cream   Topical BID Earlie Server, MD   Given at 01/08/20 [phone]   diclofenac Sodium (VOLTAREN) 1 % topical gel 2 g  2 g Topical Q8H PRN Fritzi Mandes, MD       diphenhydrAMINE (BENADRYL) injection 25 mg  25 mg Intravenous Q6H PRN Zierle-Ghosh, Asia B, DO       DULoxetine (CYMBALTA) DR capsule 60 mg  60 mg Oral Daily Zierle-Ghosh, Asia B, DO   60 mg at 01/08/20 0948   famotidine (PEPCID) tablet 20 mg  20 mg Oral BID Zierle-Ghosh, Asia B, DO   20 mg at 01/08/20 0948   feeding supplement  (BOOST / RESOURCE BREEZE) liquid 1 Container  1 Container Oral TID BM Fritzi Mandes, MD   1 Container at 01/08/20 0950   guaiFENesin-dextromethorphan (ROBITUSSIN DM) 100-10 MG/5ML syrup 5 mL  5 mL Oral Q4H PRN Zierle-Ghosh, Asia B, DO       lidocaine (XYLOCAINE) 2 % viscous mouth solution 15 mL  15 mL Mouth/Throat TID PRN Fritzi Mandes, MD   15 mL at 01/05/20 1013   magic mouthwash  15 mL Oral TID Zierle-Ghosh, Asia B, DO   15 mL at 01/08/20 0949   MEDLINE mouth rinse  15 mL Mouth Rinse BID Sharion Settler, NP   15 mL at 01/08/20 0950   metoprolol tartrate (LOPRESSOR) injection 5 mg  5 mg Intravenous Q4H PRN Fritzi Mandes, MD       metoprolol tartrate (LOPRESSOR) tablet 50 mg  50 mg Oral BID Corey Skains, MD   50 mg at 01/08/20 2878   multivitamin with minerals tablet 1 tablet  1 tablet Oral Daily Zierle-Ghosh, Asia B, DO   1 tablet at 01/08/20 0946   omega-3 acid ethyl esters (LOVAZA) capsule 1 g  1 g Oral Daily Zierle-Ghosh, Asia B, DO   1 g at 01/08/20 0946   ondansetron (ZOFRAN) injection 4 mg  4 mg Intravenous Q6H PRN Fritzi Mandes, MD   4 mg at 01/07/20 0936   pantoprazole (PROTONIX) EC tablet 40 mg  40 mg Oral Daily Zierle-Ghosh, Asia B, DO   40 mg at 01/08/20 0948   polyethylene glycol (MIRALAX / GLYCOLAX) packet 17 g  17 g Oral BID Zierle-Ghosh, Asia B, DO   17 g at 01/07/20 2247   sodium phosphate (FLEET) 7-19 GM/118ML enema 1 enema  1 enema Rectal Once Fritzi Mandes, MD       sodium phosphate (FLEET) 7-19 GM/118ML enema 1 enema  1 enema Rectal Once Fritzi Mandes, MD       Facility-Administered Medications Ordered in Other Encounters  Medication Dose Route Frequency Provider Last Rate Last Admin   heparin lock flush 100 unit/mL  500 Units Intravenous Once [place], Melissa C, MD       sodium chloride flush (NS) 0.9 % injection 10 mL  10 mL Intravenous PRN Mike Gip, Melissa C, MD   10 mL at 04/05/17 1034   sodium chloride flush (NS) 0.9 % injection 10 mL  10 mL Intravenous PRN  Lequita Asal, MD   10 mL at 02/27/19 1033     Discharge Medications: Please see discharge summary for a list of discharge medications.  Relevant Imaging Results:  Relevant Lab Results:   Additional Information SS# [id number]  Shelbie Hutching, RN

## 2020-01-08 NOTE — Progress Notes (Signed)
[facility] Liaison note:  New referral for [employer] community Palliative program to follow post discharge received from [place]. Patient information given to referral. Thank you or this referral.  Flo Shanks BSN, RN, [place] [phone]

## 2020-01-08 NOTE — Progress Notes (Signed)
Physical Therapy Treatment Patient Details Name: Toni Griffith MRN: [id number] DOB: September 19, 1940 Today's Date: 01/08/2020    History of Present Illness presented to ER secondary to SOB, burning/swollen lips; admitted for management of acute hypoxic respiratory failure and sepsis related to PNA, also noted in afib with RVR upon admission.    PT Comments    Pt on commode upon arrival.  Assisted with care and transferred with min a x 1 back to bed.  After seated rest stood x 2.  Pt was able to lay back down with min a x 1 and get up on opposite side of bed with min a x 1 to be able to access recliner.  Stood and transferred with RW and min a x 1.  After seated rest and obtaining longer O2 cord she is able to walk a small 10' loop in room with Rw and min a x 1.  Fatigued with effort.  Sats and HR monitored during session on 2 lpm.  Overall tolerated well.  Some fluctuations in HR but son stated she does have a history of A-fib which would explain readings.  Remained in recliner at rest.  Pt chose to have son translate for her.  She shook her head no to ipad. Son reports need for SNF upon discharge.  Pt was independent with mobility prior to admission.  SWS working on bed placement.     Follow Up Recommendations  SNF     Equipment Recommendations  Rolling walker with 5" wheels;3in1 (PT);Wheelchair (measurements PT);Wheelchair cushion (measurements PT)    Recommendations for Other Services       Precautions / Restrictions Precautions Precautions: Fall    Mobility  Bed Mobility Overal bed mobility: Needs Assistance Bed Mobility: Supine to Sit;Sit to Supine     Supine to sit: Min assist Sit to supine: Min assist      Transfers Overall transfer level: Needs assistance Equipment used: Rolling walker (2 wheeled) Transfers: Sit to/from Stand Sit to Stand: Min assist            Ambulation/Gait Ambulation/Gait assistance: Herbalist (Feet): 8 Feet Assistive  device: Rolling walker (2 wheeled) Gait Pattern/deviations: Step-through pattern;Decreased step length - right;Decreased step length - left;Trunk flexed Gait velocity: decreased   General Gait Details: short distances in room with min a x 1 for safety and light assist.   Stairs             Wheelchair Mobility    Modified Rankin (Stroke Patients Only)       Balance Overall balance assessment: Needs assistance Sitting-balance support: No upper extremity supported;Feet supported Sitting balance-Leahy Scale: Good     Standing balance support: Bilateral upper extremity supported Standing balance-Leahy Scale: Fair                              Cognition Arousal/Alertness: Awake/alert Behavior During Therapy: WFL for tasks assessed/performed Overall Cognitive Status: Within Functional Limits for tasks assessed                                        Exercises Other Exercises Other Exercises: standing marching in place 2 x 10 with seated rest    General Comments        Pertinent Vitals/Pain Pain Assessment: No/denies pain    Home Living  Prior Function            PT Goals (current goals can now be found in the care plan section) Progress towards PT goals: Progressing toward goals    Frequency    Min 2X/week      PT Plan Current plan remains appropriate    Co-evaluation              AM-PAC PT "6 Clicks" Mobility   Outcome Measure  Help needed turning from your back to your side while in a flat bed without using bedrails?: None Help needed moving from lying on your back to sitting on the side of a flat bed without using bedrails?: A Little Help needed moving to and from a bed to a chair (including a wheelchair)?: A Little Help needed standing up from a chair using your arms (e.g., wheelchair or bedside chair)?: A Little Help needed to walk in hospital room?: A Little Help needed climbing  3-5 steps with a railing? : A Lot 6 Click Score: 18    End of Session Equipment Utilized During Treatment: Gait belt;Oxygen Activity Tolerance: Patient tolerated treatment well;Patient limited by fatigue Patient left: in chair;with call bell/phone within reach;with chair alarm set Nurse Communication: Mobility status       Time: 6384-5364 PT Time Calculation (min) (ACUTE ONLY): 25 min  Charges:  $Gait Training: 8-22 mins $Therapeutic Exercise: 8-22 mins                    Chesley Noon, PTA 01/08/20, 1:10 PM

## 2020-01-08 NOTE — Progress Notes (Signed)
[facility] [place] NAME: Anesia Blackwell    MR#:  [id number]  DATE OF BIRTH:  Apr 16, 1941  SUBJECTIVE:   Sign language interpretation--not working so took help from dter patty on Facetime  Patient did take bowel prep yesterday. Had good bowel movement. Ate good breakfast this morning. Has some nausea. No other complaints. Hemodynamically stable.  She is sitting out in the chair. REVIEW OF SYSTEMS:   Review of Systems  Unable to perform ROS: Other  pt nonverbal Tolerating Diet:yes Tolerating PT:   DRUG ALLERGIES:  No Known Allergies  VITALS:  Blood pressure 140/78, pulse 88, temperature 99.2 F (37.3 C), temperature source Oral, resp. rate 14, height 5\' 3"  (1.6 m), weight 80 kg, SpO2 100 %.  PHYSICAL EXAMINATION:   Physical Exam  GENERAL:  79 y.o.-year-old patient lying in the bed with no acute distress.  EYES: Pupils equal, round, reactive to light and accommodation. No scleral icterus.   HEENT: Head atraumatic, normocephalic. Oropharynx and nasopharynx clear.  NECK:  Supple, no jugular venous distention. No thyroid enlargement, no tenderness.  LUNGS: Normal breath sounds bilaterally, no wheezing, rales, rhonchi. No use of accessory muscles of respiration. Port+ CARDIOVASCULAR: S1, S2 normal. No murmurs, rubs, or gallops. tachycardia ABDOMEN: Soft, nontender, nondistended. Bowel sounds present. No organomegaly or mass.  EXTREMITIES: No cyanosis, clubbing or edema b/l.    NEUROLOGIC: Cranial nerves II through XII are intact. No focal Motor or sensory deficits b/l.   PSYCHIATRIC:  patient is alert and oriented x 3. mute SKIN: No obvious rash, lesion, or ulcer.   LABORATORY PANEL:  CBC Recent Labs  Lab 01/07/20 0531  WBC 5.8  HGB 8.7*  HCT 26.6*  PLT 220    Chemistries  Recent Labs  Lab 01/04/20 0332 01/04/20 0332 01/07/20 0531  NA 132*  --   --   K 4.5  --   --   CL 99  --   --   CO2 27  --   --   GLUCOSE 131*   --   --   BUN 20  --   --   CREATININE 0.95   < > 0.71  CALCIUM 9.2  --   --   MG 1.8  --   --   AST 21  --   --   ALT 14  --   --   ALKPHOS 78  --   --   BILITOT 1.6*  --   --    < > = values in this interval not displayed.   Cardiac Enzymes No results for input(s): TROPONINI in the last 168 hours. RADIOLOGY:  No results found. ASSESSMENT AND PLAN:  Paislynn Hegstrom  is a 79 y.o. female, with history of squamous cell lung cancer, hypertension, GERD, hearing impairment, and arthritis presents to the ER with daughter for burning, swollen lips and mouth.  Daughter is interpreting American sign language.  They both report that patient had started 2 medications 3 days ago.  The first was an Atrovent inhaler, and the second was a new chemo infusion.  Yesterday, patient started to notice swollen burning lips.  #Acute hypoxic respiratory failuret likely secondary to worsening lung mass -Lasix and cefepime started in ED--d/c lasix -Continue cefepime, Procalcitonin 0.58--change to oral abxs withelevated wbc and h/o cancer cont abxs for total 7 days -Continue 2 L nasal cannula--wean down as tolerated(does not wear at home) -assess for home oxygen use -CT chest negative for PE  Progression of disease, with enlarging left hilar mass and right lower lobe pulmonary nodule.Stable multilocular left pleural effusion.Trace right pleural effusion -Oncology ---seen by Dr Mike Gip-- pt will resume cancer rx as out pt -pt was seen by Palla[facility] care Josh--FULL code for now and family will discuss furhter with dr Mike Gip  Sepsis secondary to pneumonia in immunocompromised patient--POA -resolved -Worsening findings on chest x-ray appears due to lung mass--cannot rule out underlying infection -Continue cefepime--changed to po cefdinir -BC negative  #A. fib with RVR in the setting of above--new onset -Likely secondary to pulmonary disease -Echo noted CHA2DS2-VASc 5, IV heparin drip--now on po eliquis per  cardiology rec -pt was on IV metoprolol--changed to oral BB 50 mg bid - cardiology consult with Dr Nehemiah Massed -defer long term anticoagulation with Cardiology--recommends to start po DOAC's  # constipation with Nausea and vomiting  -suppository and enema yday--had BM  #Oral ulcer - Magic mouthwash with lidocaine  #CHF acute likley diastolic (echo in 8022 EF 55-60%) -Given soft blood pressures (systolic in the 33K) holding off on further diuresis -Daily weights, I and O -Cardiology consult--Dr Nehemiah Massed -uop 1050 cc -appears Euvolemic  #Protein calorie malnutrition-mild Encourage protein shake between meals  PT recommends rehab--family requests rehab Palliative care consultation done--FULL code  DVT Prophylaxis-  Eliquis Family Communication: dter patty Code Status:  Full Admission status: inpatient  Procedures:none Family communication :family dter patty on facetime Consults :Oncology, cardiology CODE STATUS: FULL    Dispo: The patient is from: Home              Anticipated d/c is to: rehab once bed available              Anticipated d/c date is: 1 day              Patient currently is stable for discharge  TOTAL TIME TAKING CARE OF THIS PATIENT: 25 minutes.  >50% time spent on counselling and coordination of care  Note: This dictation was prepared with Dragon dictation along with smaller phrase technology. Any transcriptional errors that result from this process are unintentional.  Fritzi Mandes M.D    [facility]   CC: Primary care physician; Francesca Oman, DOPatient ID: [name], female   DOB: 1941/06/03, 79 y.o.   MRN: [id number]

## 2020-01-09 ENCOUNTER — Other Ambulatory Visit: Payer: Self-pay | Admitting: Internal Medicine

## 2020-01-09 DIAGNOSIS — J90 Pleural effusion, not elsewhere classified: Secondary | ICD-10-CM

## 2020-01-09 DIAGNOSIS — C8339 Diffuse large B-cell lymphoma, extranodal and solid organ sites: Secondary | ICD-10-CM

## 2020-01-09 LAB — CBC
HCT: 28.4 % — ABNORMAL LOW (ref 36.0–46.0)
Hemoglobin: 9.3 g/dL — ABNORMAL LOW (ref 12.0–15.0)
MCH: 32.1 pg (ref 26.0–34.0)
MCHC: 32.7 g/dL (ref 30.0–36.0)
MCV: 97.9 fL (ref 80.0–100.0)
Platelets: 178 10*3/uL (ref 150–400)
RBC: 2.9 MIL/uL — ABNORMAL LOW (ref 3.87–5.11)
RDW: 17.9 % — ABNORMAL HIGH (ref 11.5–15.5)
WBC: 8.5 10*3/uL (ref 4.0–10.5)
nRBC: 0.7 % — ABNORMAL HIGH (ref 0.0–0.2)

## 2020-01-09 LAB — CREATININE, SERUM
Creatinine, Ser: 0.7 mg/dL (ref 0.44–1.00)
GFR calc Af Amer: >60 mL/min (ref >60)
GFR calc non Af Amer: >60 mL/min (ref >60)

## 2020-01-09 LAB — CULTURE, BLOOD (ROUTINE X 2)
Culture: NO GROWTH
Culture: NO GROWTH
Special Requests: ADEQUATE

## 2020-01-09 MED ORDER — METOPROLOL TARTRATE 50 MG PO TABS: 50.0000 mg | ORAL_TABLET | Freq: Two times a day (BID) | ORAL | Status: AC

## 2020-01-09 MED ORDER — LIDOCAINE VISCOUS HCL 2 % MT SOLN: 15.0000 mL | Freq: Three times a day (TID) | OROMUCOSAL | 0 refills | Status: AC | PRN

## 2020-01-09 MED ORDER — ACETAMINOPHEN 325 MG PO TABS
650.0000 mg | ORAL_TABLET | Freq: Four times a day (QID) | ORAL | Status: DC | PRN
  Administered 2020-01-09: 650 mg via ORAL
  Filled 2020-01-09: qty 2

## 2020-01-09 MED ORDER — HEPARIN SOD (PORK) LOCK FLUSH 100 UNIT/ML IV SOLN
INTRAVENOUS | Status: AC
  Filled 2020-01-09: qty 5

## 2020-01-09 MED ORDER — BISACODYL 10 MG RE SUPP: 10.0000 mg | Freq: Every day | RECTAL | 0 refills | Status: AC | PRN

## 2020-01-09 MED ORDER — GUAIFENESIN-DM 100-10 MG/5ML PO SYRP: 5.0000 mL | ORAL_SOLUTION | ORAL | 0 refills | Status: AC | PRN

## 2020-01-09 MED ORDER — CLOTRIMAZOLE 1 % EX CREA: TOPICAL_CREAM | Freq: Two times a day (BID) | CUTANEOUS | 0 refills | Status: AC

## 2020-01-09 MED ORDER — MAGIC MOUTHWASH: 15.0000 mL | Freq: Three times a day (TID) | ORAL | 0 refills | Status: AC

## 2020-01-09 MED ORDER — POLYETHYLENE GLYCOL 3350 17 G PO PACK: 17.0000 g | PACK | Freq: Two times a day (BID) | ORAL | 0 refills | Status: AC

## 2020-01-09 MED ORDER — APIXABAN 5 MG PO TABS: 5.0000 mg | ORAL_TABLET | Freq: Two times a day (BID) | ORAL | Status: AC

## 2020-01-09 MED ORDER — ALUM & MAG HYDROXIDE-SIMETH 200-200-20 MG/5ML PO SUSP: 30.0000 mL | ORAL | 0 refills | Status: AC | PRN

## 2020-01-09 NOTE — Progress Notes (Signed)
ANTICOAGULATION CONSULT NOTE -  Pharmacy Consult for Eliquis Indication: atrial fibrillation  No Known Allergies  Patient Measurements: Height: 5\' 3"  (160 cm) Weight: 79 kg (174 lb 2.6 oz) IBW/kg (Calculated) : 52.4 Heparin Dosing Weight: 61 kg  Vital Signs: Temp: 98.5 F (36.9 C) (06/11 0412) Temp Source: Oral (06/11 0011) BP: 95/80 (06/11 0412) Pulse Rate: 87 (06/11 0412)  Labs: Recent Labs    01/07/20 0531 01/09/20 0509  HGB 8.7* 9.3*  HCT 26.6* 28.4*  PLT 220 178  CREATININE 0.71 0.70    Estimated Creatinine Clearance: 59.5 mL/min (by C-G formula based on SCr of 0.7 mg/dL).   Medical History: Past Medical History:  Diagnosis Date  . Arthritis   . Cancer [facility])    lymphoma-right hip  . Deaf   . GERD (gastroesophageal reflux disease)   . Hypertension    NOT ON MED AT PRESENT  . Squamous cell lung cancer (HCC)     Medications:  Scheduled:  . albuterol  2.5 mg Nebulization Q6H  . apixaban  5 mg Oral BID  . aspirin EC  81 mg Oral Daily  . atorvastatin  80 mg Oral QPM  . bisacodyl  10 mg Oral Daily  . bisacodyl  10 mg Rectal Daily  . cefdinir  300 mg Oral Q12H  . Chlorhexidine Gluconate Cloth  6 each Topical Daily  . cholecalciferol  1,000 Units Oral Daily  . clotrimazole   Topical BID  . DULoxetine  60 mg Oral Daily  . famotidine  20 mg Oral BID  . feeding supplement  1 Container Oral TID BM  . magic mouthwash  15 mL Oral TID  . mouth rinse  15 mL Mouth Rinse BID  . metoprolol tartrate  50 mg Oral BID  . multivitamin with minerals  1 tablet Oral Daily  . omega-3 acid ethyl esters  1 g Oral Daily  . pantoprazole  40 mg Oral Daily  . polyethylene glycol  17 g Oral BID  . sodium phosphate  1 enema Rectal Once  . sodium phosphate  1 enema Rectal Once    Assessment: Patient arrives x c/o burning/swollen lips w/ h/o squam cell lung CA s/p chemo tx, GERD, COPD w/ pending sleep study, deafness, arrives to ED in afib/aflutter w/ RVR on EKG, troponin WNL,  CBC WNL for patient's clinical scenario, but also has elevated WBC w/ left shift and increased vascular congestion on CXR. Baseline aPTT/INR pending, no anticoagulation PTA.   Patient was started on heparin drip for management of new onset afib w/ a CHADS-VASc score of 4 (HTN, age > 22, female).   Pharmacy has now been consulted to start Eliquis   Goal of Therapy:  Monitor platelets by anticoagulation protocol: Yes   Plan:  Will continue Eliquis 5mg  bid   -H/H stable with Hgb 9.3, PLTs trending down will continue to monitor.  Will check CBC and Scr a minimum of every 3 days per protocol.  Lu Duffel, PharmD, BCPS Clinical Pharmacist 01/09/2020 8:05 AM

## 2020-01-09 NOTE — TOC Transition Note (Signed)
Transition of Care [facility]) - CM/SW Discharge Note   Patient Details  Name: Acelyn Basham MRN: [id number] Date of Birth: 08-31-1940  Transition of Care [facility]) CM/SW Contact:  Meriel Flavors, LCSW Phone Number: 01/09/2020, 2:09 PM   Clinical Narrative:    [id number] authorization number     Barriers to Discharge: Continued Medical Work up   Patient Goals and CMS Choice   CMS Medicare.gov Compare Post Acute Care list provided to:: Patient Represenative (must comment) Choice offered to / list presented to : Adult Children, Patient  Discharge Placement                       Discharge Plan and Services   Discharge Planning Services: CM Consult Post Acute Care Choice: Skilled Nursing Facility                    HH Arranged: PT, OT, Nurse's Aide [facility] [place] Agency: [facility] (now [facility]) Date [place]: 01/07/20 Time [place]: [place] Representative spoke with at [place]: [place] (SDOH) Interventions     Readmission Risk Interventions Readmission Risk Prevention Plan 01/05/2020  Transportation Screening Complete  PCP or Specialist Appt within 3-5 Days Complete  HRI or Home Care Consult Complete  Medication Review (RN Care Manager) Complete  Some recent data might be hidden

## 2020-01-09 NOTE — Care Management Important Message (Signed)
Important Message  Patient Details  Name: Toni Griffith MRN: [id number] Date of Birth: 06/18/1941   Medicare Important Message Given:  Yes  Talked with daughter, [name] [phone]) by phone and reviewed the Important Message from Medicare.  She stated she understood her mother's rights and they were waiting on a Rehab. bed so was in agreement with the discharge plan.    [name] 01/09/2020, 11:29 AM

## 2020-01-09 NOTE — Discharge Summary (Signed)
Physician Discharge Summary  Toni Griffith YQI:[id number] DOB: 1940/10/27 DOA: 01/03/2020  PCP: Francesca Oman, DO  Admit date: 01/03/2020 Discharge date: 01/09/2020  Admitted From: Home Disposition:  SNF  Recommendations for Outpatient Follow-up:  1. Follow up with PCP in 1-2 weeks 2. Follow-up with your oncologist. 3. Please obtain BMP/CBC in one week 4. Please follow up on the following pending results: None  Home Health: No Equipment/Devices: Rolling walker, home oxygen Discharge Condition: Fair CODE STATUS: Full Diet recommendation: Heart Healthy  Brief/Interim Summary: Toni a79 Griffith,with history of squamous cell lung cancer, hypertension, GERD, hearing impairment, and arthritis presents to the ER with daughter for burning, swollen lips and mouth. Daughter is interpreting American sign language. They both report that patient had started 2 medications 3 days ago. The first was anAtrovent inhaler, and the second was a new chemo infusion. Yesterday, patient started to notice swollen burning lips.  She found to have some blistering's on inner side of her lower lip.  Responded well to Magic mouthwash.  Patient also developed acute on chronic respiratory failure and was found to have pneumonia which was treated with cefepime initially and finish the course with cefdinir.  Blood cultures remain negative.  CT chest negative for PE  Progression of disease, with enlarging left hilar mass and right lower lobe pulmonary nodule.Stable multilocular left pleural effusion.Trace right pleural effusion  Patient has worsening lung mass and will follow up with her oncologist. She was desaturating on room air requiring 2 L all the time.  It also developed new onset A. fib with RVR most likely secondary to pulmonary disease.  Cardiology was consulted and she was started on Eliquis and metoprolol.  She was rate controlled on discharge.  Patient has an history of diastolic  heart failure.  Appears euvolemic.  No acute concern.  Patient has protein calorie malnutrition and generalized weakness.  Our physical therapist were recommending rehab and she is being discharged to a rehab before returning home.  Patient is deaf and mute.  Communication was mostly done with the help of a sign language interpreter and daughter on face time.  Patient has life limiting illness.  Will need palliative care evaluation and management as an outpatient.  Discharge Diagnoses:  Active Problems:   Malignant neoplasm of lung ([place])   Community acquired pneumonia of right lung   Sepsis with acute hypoxic respiratory failure (HCC)   Lung mass   Mucositis   Cheilitis   Palliative care encounter   Malnutrition of moderate degree  Discharge Instructions  Discharge Instructions    Diet - low sodium heart healthy   Complete by: As directed    For home use only DME oxygen   Complete by: As directed    Length of Need: Lifetime   Liters per Minute: 2   Frequency: Continuous (stationary and portable oxygen unit needed)   Oxygen conserving device: Yes   Oxygen delivery system: Gas   Increase activity slowly   Complete by: As directed      Allergies as of 01/09/2020   No Known Allergies     Medication List    STOP taking these medications   levofloxacin 500 MG tablet Commonly known as: Levaquin     TAKE these medications   acetaminophen 500 MG tablet Commonly known as: TYLENOL Take 500 mg by mouth every 6 (six) hours as needed for moderate pain.   allopurinol 300 MG tablet Commonly known as: ZYLOPRIM TAKE 1 TABLET BY MOUTH EVERY DAY  alum & mag hydroxide-simeth 200-200-20 MG/5ML suspension Commonly known as: MAALOX/MYLANTA Take 30 mLs by mouth every 4 (four) hours as needed for indigestion or heartburn.   apixaban 5 MG Tabs tablet Commonly known as: ELIQUIS Take 1 tablet (5 mg total) by mouth 2 (two) times daily.   aspirin EC 81 MG tablet Take 81 mg by mouth  daily.   atorvastatin 80 MG tablet Commonly known as: LIPITOR Take 80 mg by mouth every evening.   b complex vitamins tablet Take 1 tablet by mouth daily.   bisacodyl 10 MG suppository Commonly known as: DULCOLAX Place 1 suppository (10 mg total) rectally daily as needed for moderate constipation.   clotrimazole 1 % cream Commonly known as: LOTRIMIN Apply topically 2 (two) times daily.   dextromethorphan 15 MG/5ML syrup Take 10 mLs by mouth 4 (four) times daily as needed for cough.   diclofenac Sodium 1 % Gel Commonly known as: VOLTAREN Apply topically 4 (four) times daily.   DULoxetine 60 MG capsule Commonly known as: CYMBALTA Take 60 mg by mouth daily.   FISH OIL PO Take 1 tablet by mouth daily.   guaiFENesin-dextromethorphan 100-10 MG/5ML syrup Commonly known as: ROBITUSSIN DM Take 5 mLs by mouth every 4 (four) hours as needed for cough.   HYDROcodone-acetaminophen 5-325 MG tablet Commonly known as: Norco Take 1/2 - 1 tablet by mouth every 6 hours as needed for severe pain.   ipratropium-albuterol 0.5-2.5 (3) MG/3ML Soln Commonly known as: DUONEB Take 3 mLs by nebulization every 4 (four) hours as needed.   IRON CHEWS PEDIATRIC PO Take 5 mg by mouth daily.   lidocaine 2 % solution Commonly known as: XYLOCAINE Use as directed 15 mLs in the mouth or throat 3 (three) times daily as needed for mouth pain.   lidocaine-prilocaine cream Commonly known as: EMLA Apply cream 1 hour before chemotherapy treatment and place small peive of saran wrap over cream to protect clothing   magic mouthwash Soln Take 15 mLs by mouth 3 (three) times daily.   metoprolol tartrate 50 MG tablet Commonly known as: LOPRESSOR Take 1 tablet (50 mg total) by mouth 2 (two) times daily.   ondansetron 4 MG disintegrating tablet Commonly known as: Zofran ODT Take 1 tablet (4 mg total) by mouth every 8 (eight) hours as needed for nausea or vomiting.   pantoprazole 40 MG tablet Commonly  known as: Protonix Take 1 tablet (40 mg total) by mouth daily.   polyethylene glycol 17 g packet Commonly known as: MIRALAX / GLYCOLAX Take 17 g by mouth 2 (two) times daily.   Vitamin D3 50 MCG (2000 UT) capsule Take 1,000 Units by mouth daily.   vitamin E 180 MG (400 UNITS) capsule Take 400 Units by mouth daily.            Durable Medical Equipment  (From admission, onward)         Start     Ordered   01/09/20 0000  For home use only DME oxygen       Question Answer Comment  Length of Need Lifetime   Liters per Minute 2   Frequency Continuous (stationary and portable oxygen unit needed)   Oxygen conserving device Yes   Oxygen delivery system Gas      01/09/20 1500          Follow-up Information    [facility] Follow up on 01/15/2020.   Specialty: Cardiology Why: at 9:30am. Enter through [facility] Contact information: [address] [place] [phone]       Francesca Oman, DO Follow up.   Specialty: Internal Medicine Why: Please schedule PCP follow-up within 5-7 days of discharge date. Contact information: [address] [place] [address] [phone]              No Known Allergies  Consultations:  Cardiology  Oncology  Procedures/Studies: DG Chest 2 View  Result Date: 12/25/2019 CLINICAL DATA:  79 year old female with history of left-sided pleural effusion. EXAM: CHEST - 2 VIEW COMPARISON:  Chest x-ray 12/05/2019. FINDINGS: Mass-like opacity in the left mid lung again noted. Ill-defined opacities throughout the left mid to lower lung. Trace left pleural effusion. Linear area of architectural distortion in the right mid lung. Small pulmonary nodule measuring 11 mm in the lateral aspect of the right mid lung, similar to the prior examination. No evidence of pulmonary edema. Cardiac silhouette is largely obscured. Aortic  atherosclerosis. Status post median sternotomy for CABG. IMPRESSION: 1. The appearance of the chest is very similar to the prior study 12/05/2019 with chronic changes of known lung cancer and postradiation changes, as detailed above. 2. Trace left pleural effusion. 3. Aortic atherosclerosis. Electronically Signed   By: Vinnie Langton M.D.   On: 12/25/2019 10:51   CT ANGIO CHEST PE W OR WO CONTRAST  Result Date: 01/03/2020 CLINICAL DATA:  Shortness of breath, oral swelling, history of squamous cell lung cancer and lymphoma EXAM: CT ANGIOGRAPHY CHEST WITH CONTRAST TECHNIQUE: Multidetector CT imaging of the chest was performed using the standard protocol during bolus administration of intravenous contrast. Multiplanar CT image reconstructions and MIPs were obtained to evaluate the vascular anatomy. CONTRAST:  88mL OMNIPAQUE IOHEXOL 350 MG/ML SOLN COMPARISON:  12/02/2019, 01/03/2020 FINDINGS: Cardiovascular: This is a technically adequate evaluation of the pulmonary vasculature. There are no filling defects or pulmonary emboli. Stable atherosclerosis of the thoracic aorta and coronary vessels. Mediastinum/Nodes: Stable appearance of the thyroid, esophagus, and trachea. No pathologic mediastinal adenopathy. Lungs/Pleura: There is progressive left perihilar consolidation, consistent with progression of disease. Multilocular left pleural effusion is again noted, not appreciably changed in size. There is a new trace right pleural effusion. Increasing right lower lobe pulmonary nodule now measures 13 mm reference image 32, compatible with progression of disease. Upper Abdomen: Stable left lobe hepatic cyst. 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 Reconstructed images demonstrate no additional findings. Review of the MIP images confirms the above findings. IMPRESSION: 1. Progression of disease, with enlarging left hilar mass and right lower  lobe pulmonary nodule. 2. Stable multilocular left pleural effusion. 3. Trace right pleural effusion. Electronically Signed   By: Randa Ngo M.D.   On: 01/03/2020 22:31   DG Chest Left Decubitus  Result Date: 12/25/2019 CLINICAL DATA:  Assess left pleural effusion. History of left lung cancer. EXAM: CHEST - LEFT DECUBITUS COMPARISON:  Earlier same day and 12/05/2019, chest CT 12/02/2019 FINDINGS: Right lung demonstrates mild linear density over the mid lung likely atelectasis with stable right midlung nodule. Moderate stable opacification over the left midlung and left base. No definite free flowing left pleural effusion. Remainder of the exam is unchanged. IMPRESSION: Stable left midlung and left basilar opacification. No definite evidence of free-flowing left pleural effusion. Linear density right midlung unchanged likely atelectasis in stable right midlung nodule. Electronically Signed   By: Marin Olp M.D.   On: 12/25/2019 10:51  DG Chest Portable 1 View  Result Date: 01/03/2020 CLINICAL DATA:  Short of breath, oral swelling, history of squamous cell lung cancer and lymphoma EXAM: PORTABLE CHEST 1 VIEW COMPARISON:  12/25/2019 FINDINGS: Single frontal view of the chest demonstrates right chest wall port unchanged. Cardiac silhouette is stable. There is increasing consolidation and effusion within the left hemithorax. Stable nodularity within the right midlung zone. Small amount of fluid within the minor fissure. Increased vascular congestion since prior study.  No pneumothorax. IMPRESSION: 1. Worsening left lung consolidation and effusion. 2. Stable nodularity within the right midlung zone. 3. Increased vascular congestion since prior study. Electronically Signed   By: Randa Ngo M.D.   On: 01/03/2020 18:20   ECHOCARDIOGRAM COMPLETE  Result Date: 01/05/2020    ECHOCARDIOGRAM REPORT   Patient Name:   Toni Griffith Date of Exam: 01/04/2020 Medical Rec #:  [id number]            Height:        63.0 in Accession #:    [id number]           Weight:       173.1 lb Date of Birth:  [birth year]            BSA:          1.818 m Patient Age:    30 years             BP:           127/76 mmHg Patient Gender: F                    HR:           100 bpm. Exam Location:  [facility] Procedure: 2D Echo Indications:     Atrial Fibrillation  History:         Patient has prior history of Echocardiogram examinations. Risk                  Factors:Hypertension.  Sonographer:     L Thornton-Maynard Referring Phys:  [id number] [place] Diagnosing Phys: Kathlyn Sacramento MD IMPRESSIONS  1. Left ventricular ejection fraction, by estimation, is 60 to 65%. The left ventricle has normal function. The left ventricle has no regional wall motion abnormalities. There is mild left ventricular hypertrophy. Left ventricular diastolic parameters are indeterminate.  2. Right ventricular systolic function is normal. The right ventricular size is normal. There is moderately elevated pulmonary artery systolic pressure. The estimated right ventricular systolic pressure is 09.3 mmHg.  3. The mitral valve is normal in structure. Mild mitral valve regurgitation. No evidence of mitral stenosis.  4. Tricuspid valve regurgitation is mild to moderate.  5. The aortic valve is normal in structure. Aortic valve regurgitation is not visualized. Mild aortic valve sclerosis is present, with no evidence of aortic valve stenosis.  6. Mildly dilated pulmonary artery.  7. The inferior vena cava is normal in size with greater than 50% respiratory variability, suggesting right atrial pressure of 3 mmHg. FINDINGS  Left Ventricle: Left ventricular ejection fraction, by estimation, is 60 to 65%. The left ventricle has normal function. The left ventricle has no regional wall motion abnormalities. The left ventricular internal cavity size was normal in size. There is  mild left ventricular hypertrophy. Left ventricular diastolic parameters are indeterminate. Right  Ventricle: The right ventricular size is normal. No increase in right ventricular wall thickness. Right ventricular systolic function is normal. There is moderately elevated pulmonary artery systolic pressure.  The tricuspid regurgitant velocity is 3.49 m/s, and with an assumed right atrial pressure of 5 mmHg, the estimated right ventricular systolic pressure is 35.3 mmHg. Left Atrium: Left atrial size was normal in size. Right Atrium: Right atrial size was normal in size. Pericardium: There is no evidence of pericardial effusion. Mitral Valve: The mitral valve is normal in structure. Normal mobility of the mitral valve leaflets. Mild mitral valve regurgitation. No evidence of mitral valve stenosis. MV peak gradient, 4.5 mmHg. The mean mitral valve gradient is 2.0 mmHg. Tricuspid Valve: The tricuspid valve is normal in structure. Tricuspid valve regurgitation is mild to moderate. No evidence of tricuspid stenosis. Aortic Valve: The aortic valve is normal in structure. Aortic valve regurgitation is not visualized. Mild aortic valve sclerosis is present, with no evidence of aortic valve stenosis. Aortic valve mean gradient measures 8.0 mmHg. Aortic valve peak gradient measures 12.5 mmHg. Aortic valve area, by VTI measures 1.65 cm. Pulmonic Valve: The pulmonic valve was normal in structure. Pulmonic valve regurgitation is mild. No evidence of pulmonic stenosis. Aorta: The aortic root is normal in size and structure. Pulmonary Artery: The pulmonary artery is mildly dilated. Venous: The inferior vena cava is normal in size with greater than 50% respiratory variability, suggesting right atrial pressure of 3 mmHg. IAS/Shunts: No atrial level shunt detected by color flow Doppler.  LEFT VENTRICLE PLAX 2D LVIDd:         4.39 cm  Diastology LVIDs:         2.58 cm  LV e' lateral:   7.72 cm/s LV PW:         1.16 cm  LV E/e' lateral: 6.0 LV IVS:        1.24 cm  LV e' medial:    7.40 cm/s LVOT diam:     2.20 cm  LV E/e' medial:   6.3 LV SV:         48 LV SV Index:   26 LVOT Area:     3.80 cm  RIGHT VENTRICLE RV S prime:     12.60 cm/s TAPSE (M-mode): 1.8 cm LEFT ATRIUM             Index LA diam:        3.70 cm 2.03 cm/m LA Vol (A2C):   62.9 ml 34.59 ml/m LA Vol (A4C):   48.8 ml 26.84 ml/m LA Biplane Vol: 59.4 ml 32.67 ml/m  AORTIC VALVE                    PULMONIC VALVE AV Area (Vmax):    1.77 cm     PV Vmax:       1.19 m/s AV Area (Vmean):   1.61 cm     PV Peak grad:  5.7 mmHg AV Area (VTI):     1.65 cm AV Vmax:           177.00 cm/s AV Vmean:          137.000 cm/s AV VTI:            0.291 m AV Peak Grad:      12.5 mmHg AV Mean Grad:      8.0 mmHg LVOT Vmax:         82.20 cm/s LVOT Vmean:        58.200 cm/s LVOT VTI:          0.126 m LVOT/AV VTI ratio: 0.43  AORTA Ao Root diam: 3.40 cm MITRAL VALVE  TRICUSPID VALVE MV Peak grad: 4.5 mmHg     TR Peak grad:   48.7 mmHg MV Mean grad: 2.0 mmHg     TR Vmax:        349.00 cm/s MV Vmax:      1.06 m/s MV Vmean:     72.6 cm/s    SHUNTS MV E velocity: 46.70 cm/s  Systemic VTI:  0.13 m MV A velocity: 76.70 cm/s  Systemic Diam: 2.20 cm MV E/A ratio:  0.61 Kathlyn Sacramento MD Electronically signed by Kathlyn Sacramento MD Signature Date/Time: 01/05/2020/9:10:20 AM    Final      Subjective: Patient has no new complaints today.  She was able to eat her breakfast without any difficulty.  Stating that her leg pain is improving.  She was sitting in the chair when seen today.  Discharge Exam: Vitals:   01/09/20 0826 01/09/20 1201  BP: 129/69 131/79  Pulse: 95 88  Resp: 20   Temp: 98.1 F (36.7 C) 97.9 F (36.6 C)  SpO2: 93% 97%   Vitals:   01/09/20 0412 01/09/20 0500 01/09/20 0826 01/09/20 1201  BP: 95/80  129/69 131/79  Pulse: 87  95 88  Resp: 17  20   Temp: 98.5 F (36.9 C)  98.1 F (36.7 C) 97.9 F (36.6 C)  TempSrc:    Oral  SpO2: 90%  93% 97%  Weight:  79 kg    Height:        General: Pt is alert, awake, not in acute distress Cardiovascular: RRR, S1/S2 +, no  rubs, no gallops Respiratory: CTA bilaterally, no wheezing, no rhonchi Abdominal: Soft, NT, ND, bowel sounds + Extremities: no edema, no cyanosis   The results of significant diagnostics from this hospitalization (including imaging, microbiology, ancillary and laboratory) are listed below for reference.    Microbiology: Recent Results (from the past 240 hour(s))  SARS Coronavirus 2 by RT PCR (hospital order, performed in [facility] hospital lab) Nasopharyngeal Nasopharyngeal Swab     Status: None   Collection Time: 01/04/20 12:36 AM   Specimen: Nasopharyngeal Swab  Result Value Ref Range Status   SARS Coronavirus 2 NEGATIVE NEGATIVE Final    Comment: (NOTE) SARS-CoV-2 target nucleic acids are NOT DETECTED. The SARS-CoV-2 RNA is generally detectable in upper and lower respiratory specimens during the acute phase of infection. The lowest concentration of SARS-CoV-2 viral copies this assay can detect is 250 copies / mL. A negative result does not preclude SARS-CoV-2 infection and should not be used as the sole basis for treatment or other patient management decisions.  A negative result may occur with improper specimen collection / handling, submission of specimen other than nasopharyngeal swab, presence of viral mutation(s) within the areas targeted by this assay, and inadequate number of viral copies (<250 copies / mL). A negative result must be combined with clinical observations, patient history, and epidemiological information. Fact Sheet for Patients:   StrictlyIdeas.no Fact Sheet for Healthcare Providers: BankingDealers.co.za This test is not yet approved or cleared  by the [place] FDA and has been authorized for detection and/or diagnosis of SARS-CoV-2 by FDA under an Emergency Use Authorization (EUA).  This EUA will remain in effect (meaning this test can be used) for the duration of the COVID-19 declaration under Section  564(b)(1) of the Act, 21 U.S.C. section 360bbb-3(b)(1), unless the authorization is terminated or revoked sooner. Performed at [facility], [address], [place], [place] [address]   MRSA PCR Screening  Status: None   Collection Time: 01/04/20  1:50 AM   Specimen: Nasopharyngeal  Result Value Ref Range Status   MRSA by PCR NEGATIVE NEGATIVE Final    Comment:        The GeneXpert MRSA Assay (FDA approved for NASAL specimens only), is one component of a comprehensive MRSA colonization surveillance program. It is not intended to diagnose MRSA infection nor to guide or monitor treatment for MRSA infections. Performed at [facility], [address] [place], [place] [address]   Urine culture     Status: None   Collection Time: 01/04/20  1:52 AM   Specimen: Urine, Random  Result Value Ref Range Status   Specimen Description   Final    URINE, RANDOM Performed at [facility], [address] [place], [place] [address]    Special Requests   Final    NONE Performed at [facility], [address], [place], [place] [address]    Culture   Final    NO GROWTH Performed at [facility], [place] [address] [place], [place] [address]    Report Status 01/05/2020 FINAL  Final  Culture, blood (routine x 2) Call MD if unable to obtain prior to antibiotics being given     Status: None   Collection Time: 01/04/20  3:32 AM   Specimen: BLOOD  Result Value Ref Range Status   Specimen Description BLOOD RIGHT ASSIST CONTROL  Final   Special Requests   Final    BOTTLES DRAWN AEROBIC AND ANAEROBIC Blood Culture results may not be optimal due to an excessive volume of blood received in culture bottles   Culture   Final    NO GROWTH 5 DAYS Performed at [facility], [address], [place], [place] [address]    Report Status 01/09/2020 FINAL  Final  Culture, blood (routine x 2) Call MD if unable to obtain prior to antibiotics being  given     Status: None   Collection Time: 01/04/20  3:32 AM   Specimen: BLOOD  Result Value Ref Range Status   Specimen Description BLOOD LEFT ASSIST CONTROL  Final   Special Requests   Final    BOTTLES DRAWN AEROBIC AND ANAEROBIC Blood Culture adequate volume   Culture   Final    NO GROWTH 5 DAYS Performed at [facility] [place], [address] [place], [place] [address]    Report Status 01/09/2020 FINAL  Final  SARS Coronavirus 2 by RT PCR (hospital order, performed in [facility] hospital lab) Nasopharyngeal Nasopharyngeal Swab     Status: None   Collection Time: 01/08/20  1:43 PM   Specimen: Nasopharyngeal Swab  Result Value Ref Range Status   SARS Coronavirus 2 NEGATIVE NEGATIVE Final    Comment: (NOTE) SARS-CoV-2 target nucleic acids are NOT DETECTED.  The SARS-CoV-2 RNA is generally detectable in upper and lower respiratory specimens during the acute phase of infection. The lowest concentration of SARS-CoV-2 viral copies this assay can detect is 250 copies / mL. A negative result does not preclude SARS-CoV-2 infection and should not be used as the sole basis for treatment or other patient management decisions.  A negative result may occur with improper specimen collection / handling, submission of specimen other than nasopharyngeal swab, presence of viral mutation(s) within the areas targeted by this assay, and inadequate number of viral copies (<250 copies / mL). A negative result must be combined with clinical observations, patient history, and epidemiological information.  Fact  Sheet for Patients:   StrictlyIdeas.no  Fact Sheet for Healthcare Providers: BankingDealers.co.za  This test is not yet approved or  cleared by the [place] FDA and has been authorized for detection and/or diagnosis of SARS-CoV-2 by FDA under an Emergency Use Authorization (EUA).  This EUA will remain in effect (meaning this test can be  used) for the duration of the COVID-19 declaration under Section 564(b)(1) of the Act, 21 U.S.C. section 360bbb-3(b)(1), unless the authorization is terminated or revoked sooner.  Performed at [facility], [address] [place], [place] [address]      Labs: BNP (last 3 results) Recent Labs    01/03/20 1833  BNP 983.3*   Basic Metabolic Panel: Recent Labs  Lab 01/03/20 1833 01/04/20 0332 01/07/20 0531 01/09/20 0509  NA 132* 132*  --   --   K 4.5 4.5  --   --   CL 98 99  --   --   CO2 24 27  --   --   GLUCOSE 117* 131*  --   --   BUN 19 20  --   --   CREATININE 0.79 0.95 0.71 0.70  CALCIUM 9.4 9.2  --   --   MG  --  1.8  --   --    Liver Function Tests: Recent Labs  Lab 01/03/20 1833 01/04/20 0332  AST 21 21  ALT 14 14  ALKPHOS 77 78  BILITOT 1.3* 1.6*  PROT 6.3* 6.4*  ALBUMIN 2.3* 2.3*   No results for input(s): LIPASE, AMYLASE in the last 168 hours. No results for input(s): AMMONIA in the last 168 hours. CBC: Recent Labs  Lab 01/03/20 1833 01/03/20 1833 01/04/20 0332 01/06/20 0254 01/06/20 1040 01/07/20 0531 01/09/20 0509  WBC 20.9*  --  21.9* 13.0*  --  5.8 8.5  NEUTROABS 20.1*  --  21.0*  --   --   --   --   HGB 9.8*   < > 9.6* 8.4* 8.9* 8.7* 9.3*  HCT 29.6*  --  29.3* 25.7*  --  26.6* 28.4*  MCV 96.7  --  98.0 97.3  --  97.4 97.9  PLT 363  --  361 273  --  220 178   < > = values in this interval not displayed.   Cardiac Enzymes: No results for input(s): CKTOTAL, CKMB, CKMBINDEX, TROPONINI in the last 168 hours. BNP: Invalid input(s): POCBNP CBG: Recent Labs  Lab 01/04/20 0126  GLUCAP 119*   D-Dimer No results for input(s): DDIMER in the last 72 hours. Hgb A1c No results for input(s): HGBA1C in the last 72 hours. Lipid Profile No results for input(s): CHOL, HDL, LDLCALC, TRIG, CHOLHDL, LDLDIRECT in the last 72 hours. Thyroid function studies No results for input(s): TSH, T4TOTAL, T3FREE, THYROIDAB in the last 72  hours.  Invalid input(s): FREET3 Anemia work up No results for input(s): VITAMINB12, FOLATE, FERRITIN, TIBC, IRON, RETICCTPCT in the last 72 hours. Urinalysis    Component Value Date/Time   COLORURINE YELLOW (A) 01/04/2020 0152   APPEARANCEUR CLEAR (A) 01/04/2020 0152   LABSPEC 1.030 01/04/2020 0152   PHURINE 5.0 01/04/2020 0152   GLUCOSEU NEGATIVE 01/04/2020 0152   HGBUR NEGATIVE 01/04/2020 0152   BILIRUBINUR NEGATIVE 01/04/2020 0152   KETONESUR NEGATIVE 01/04/2020 0152   PROTEINUR NEGATIVE 01/04/2020 0152   NITRITE NEGATIVE 01/04/2020 0152   LEUKOCYTESUR NEGATIVE 01/04/2020 0152   Sepsis Labs Invalid input(s): PROCALCITONIN,  WBC,  LACTICIDVEN Microbiology Recent Results (from the past  240 hour(s))  SARS Coronavirus 2 by RT PCR (hospital order, performed in [facility] hospital lab) Nasopharyngeal Nasopharyngeal Swab     Status: None   Collection Time: 01/04/20 12:36 AM   Specimen: Nasopharyngeal Swab  Result Value Ref Range Status   SARS Coronavirus 2 NEGATIVE NEGATIVE Final    Comment: (NOTE) SARS-CoV-2 target nucleic acids are NOT DETECTED. The SARS-CoV-2 RNA is generally detectable in upper and lower respiratory specimens during the acute phase of infection. The lowest concentration of SARS-CoV-2 viral copies this assay can detect is 250 copies / mL. A negative result does not preclude SARS-CoV-2 infection and should not be used as the sole basis for treatment or other patient management decisions.  A negative result may occur with improper specimen collection / handling, submission of specimen other than nasopharyngeal swab, presence of viral mutation(s) within the areas targeted by this assay, and inadequate number of viral copies (<250 copies / mL). A negative result must be combined with clinical observations, patient history, and epidemiological information. Fact Sheet for Patients:   StrictlyIdeas.no Fact Sheet for Healthcare  Providers: BankingDealers.co.za This test is not yet approved or cleared  by the [place] FDA and has been authorized for detection and/or diagnosis of SARS-CoV-2 by FDA under an Emergency Use Authorization (EUA).  This EUA will remain in effect (meaning this test can be used) for the duration of the COVID-19 declaration under Section 564(b)(1) of the Act, 21 U.S.C. section 360bbb-3(b)(1), unless the authorization is terminated or revoked sooner. Performed at [facility], [address] [place], [place] [address]   MRSA PCR Screening     Status: None   Collection Time: 01/04/20  1:50 AM   Specimen: Nasopharyngeal  Result Value Ref Range Status   MRSA by PCR NEGATIVE NEGATIVE Final    Comment:        The GeneXpert MRSA Assay (FDA approved for NASAL specimens only), is one component of a comprehensive MRSA colonization surveillance program. It is not intended to diagnose MRSA infection nor to guide or monitor treatment for MRSA infections. Performed at [facility], [address] [place], [place] [address]   Urine culture     Status: None   Collection Time: 01/04/20  1:52 AM   Specimen: Urine, Random  Result Value Ref Range Status   Specimen Description   Final    URINE, RANDOM Performed at [facility], [address] [place], [place] [address]    Special Requests   Final    NONE Performed at [facility], [address] [place], [place] [address]    Culture   Final    NO GROWTH Performed at [facility], [place] [address] [place], [place] [address]    Report Status 01/05/2020 FINAL  Final  Culture, blood (routine x 2) Call MD if unable to obtain prior to antibiotics being given     Status: None   Collection Time: 01/04/20  3:32 AM   Specimen: BLOOD  Result Value Ref Range Status   Specimen Description BLOOD RIGHT ASSIST CONTROL  Final   Special Requests   Final    BOTTLES DRAWN AEROBIC AND  ANAEROBIC Blood Culture results may not be optimal due to an excessive volume of blood received in culture bottles   Culture   Final    NO GROWTH 5 DAYS Performed at [facility], [address] [place], [place] [address]    Report Status 01/09/2020 FINAL  Final  Culture, blood (routine x 2) Call MD if unable to obtain prior to antibiotics being given     Status: None   Collection Time: 01/04/20  3:32 AM   Specimen: BLOOD  Result Value Ref Range Status   Specimen Description BLOOD LEFT ASSIST CONTROL  Final   Special Requests   Final    BOTTLES DRAWN AEROBIC AND ANAEROBIC Blood Culture adequate volume   Culture   Final    NO GROWTH 5 DAYS Performed at [facility], [address], [place], [place] [address]    Report Status 01/09/2020 FINAL  Final  SARS Coronavirus 2 by RT PCR (hospital order, performed in [facility] hospital lab) Nasopharyngeal Nasopharyngeal Swab     Status: None   Collection Time: 01/08/20  1:43 PM   Specimen: Nasopharyngeal Swab  Result Value Ref Range Status   SARS Coronavirus 2 NEGATIVE NEGATIVE Final    Comment: (NOTE) SARS-CoV-2 target nucleic acids are NOT DETECTED.  The SARS-CoV-2 RNA is generally detectable in upper and lower respiratory specimens during the acute phase of infection. The lowest concentration of SARS-CoV-2 viral copies this assay can detect is 250 copies / mL. A negative result does not preclude SARS-CoV-2 infection and should not be used as the sole basis for treatment or other patient management decisions.  A negative result may occur with improper specimen collection / handling, submission of specimen other than nasopharyngeal swab, presence of viral mutation(s) within the areas targeted by this assay, and inadequate number of viral copies (<250 copies / mL). A negative result must be combined with clinical observations, patient history, and epidemiological information.  Fact Sheet for Patients:    StrictlyIdeas.no  Fact Sheet for Healthcare Providers: BankingDealers.co.za  This test is not yet approved or  cleared by the [place] FDA and has been authorized for detection and/or diagnosis of SARS-CoV-2 by FDA under an Emergency Use Authorization (EUA).  This EUA will remain in effect (meaning this test can be used) for the duration of the COVID-19 declaration under Section 564(b)(1) of the Act, 21 U.S.C. section 360bbb-3(b)(1), unless the authorization is terminated or revoked sooner.  Performed at [facility], [address] [place], [place] [address]     Time coordinating discharge: Over 30 minutes  SIGNED:  Lorella Nimrod, MD  [facility] 01/09/2020, 3:01 PM  If 7PM-7AM, please contact night-coverage [email or web address]  This record has been created using Systems analyst. Errors have been sought and corrected,but may not always be located. Such creation errors do not reflect on the standard of care.

## 2020-01-09 NOTE — Progress Notes (Signed)
EMS to pick up pt. Pt to be discharged with oxygen. Family at bedside when EMS arrived

## 2020-01-12 ENCOUNTER — Telehealth: Payer: Self-pay

## 2020-01-12 DIAGNOSIS — J449 Chronic obstructive pulmonary disease, unspecified: Secondary | ICD-10-CM

## 2020-01-12 NOTE — Progress Notes (Signed)
[facility]  [address] [place], [place] [address] Phone: [phone]  Fax: [phone]   Clinic Day:  01/14/2020  Referring physician: Francesca Oman, DO  Chief Complaint: Toni Griffith is a 79 y.o. 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  HPI: The patient was last seen in the medical oncology clinic on 01/01/2020. At that time, she had chronic shortness of breath. She denied any fever or change in cough. She required some assistance with her ADLs. I discussed a palliative care consult.  Hematocrit was 33.1, hemoglobin 10.5, MCV 99.1, platelets 400,000, WBC 10,200 (ANC 7800).  Magnesium was 1.5.  Patient received 2 gm IV magnesium.  She received day 1 of cycle #1 gemcitabine.   Patient was admitted to [facility] from 01/03/2020 - 01/09/2020. The patient presented with swollen lips and mouth associated with burning. Patient had started 2 medications 3 days ago. The first was anAtrovent inhaler, and the second was gemcitabine. She was found to have some blistering's on inner side of her lower lip. She responded well to Magic mouthwash.  During admission, she developed acute on chronic respiratory failure and was found to have pneumonia. Echo showed an EF of 60-65%. Chest CT was negative for PE. There was progression of disease, with enlarging left hilar mass and right lower lobe pulmonary nodule. There was a stable multilocular left pleural effusion. There was trace right pleural effusion. Patient was treated with Cefepime initially then switched to  Cefdinir.  Blood cultures remained negative.  She was desaturating on room air requiring 2 L/min all the time. She had new onset A. fib with RVR most likely secondary to pulmonary disease.  Cardiology was consulted; she was started on Eliquis and metoprolol.  Physical therapy recommended rehab; she was discharged to a rehab  before returning home. Palliative care follow-up as an outpatient was recommended.  Symptomatically, the patient noted feeling sleepy and tired all the time. She tends to fall asleep when she is eating. Her weight is down 5 lbs. She notes early satiety. She is not drinking any Ensure or Boost. During lunch, her son will bring her protein shakes, V8 and light healthy snacks. She denies any nausea, vomiting or diarrhea. She is constipated. She is unsure if she will be completing antibiotics because she takes what the nurse give her at the center.   She is at [facility] in [place], [place]. She does not like the rehab center. Yesterday was her first day of exercise. PT is helping her gain strength in the upper and lower extremities. She will be in rehab for 10 days. She reports feeling depressed at the rehab center.   She has a cough. She is using 2 liters/min of oxygen. She denies any recent fevers. Her neuropathy is the same. She does not have any hot flashes.  She reports left knee pain. She continues to have lip swelling s/p receiving gemcitabine treatment. She feels like she has persistent side effects to gemcitabine.  She has a follow up planned with cardiology tomorrow.    Past Medical History:  Diagnosis Date  . Arthritis   . Cancer [facility])    lymphoma-right hip  . Deaf   . GERD (gastroesophageal reflux disease)   . Hypertension    NOT ON MED AT PRESENT  . Squamous cell lung cancer [facility])     Past Surgical History:  Procedure  Laterality Date  . CARDIAC SURGERY  1993  . CORONARY ARTERY BYPASS GRAFT     DOUBLE  . CYST REMOVAL HAND Right   . ENDOBRONCHIAL ULTRASOUND N/A 05/30/2018   Procedure: ENDOBRONCHIAL ULTRASOUND;  Surgeon: Tyler Pita, MD;  Location: [facility] ORS;  Service: Cardiopulmonary;  Laterality: N/A;  . PERIPHERAL VASCULAR CATHETERIZATION N/A 04/06/2016   Procedure: Glori Luis Cath Insertion;  Surgeon: Algernon Huxley, MD;  Location: [facility];  Service:  Cardiovascular;  Laterality: N/A;    Family History  Problem Relation Age of Onset  . Lung cancer Mother   . Lung cancer Father   . Diabetes Maternal Uncle   . Myasthenia gravis Maternal Grandmother   . Leukemia Maternal Grandfather     Social History:  reports that she quit smoking about 4 years ago. Her smoking use included cigarettes. She has a 80.00 pack-year smoking history. She has never used smokeless tobacco. She reports current alcohol use. She reports that she does not use drugs.  She smoked 1 1/2 packs per day for 20 years (30 pack year smoking history) until 10/2015. She is hearing impaired (deaf). She lives in [place] with her boyfriend. Her daughter lives in [place]. Her daughter, [name]'s contact number is [phone].She has 2 cats named Pepper and Baby. The patient is accompanied by interpreter Lucretia Roers, her son on the iPad, her daughter is on the phone today.   Allergies: No Known Allergies  Current Medications: Current Outpatient Medications  Medication Sig Dispense Refill  . acetaminophen (TYLENOL) 500 MG tablet Take 500 mg by mouth every 6 (six) hours as needed for moderate pain.     Marland Kitchen allopurinol (ZYLOPRIM) 300 MG tablet TAKE 1 TABLET BY MOUTH EVERY DAY (Patient taking differently: Take 300 mg by mouth daily. ) 90 tablet 0  . alum & mag hydroxide-simeth (MAALOX/MYLANTA) 200-200-20 MG/5ML suspension Take 30 mLs by mouth every 4 (four) hours as needed for indigestion or heartburn. 355 mL 0  . apixaban (ELIQUIS) 5 MG TABS tablet Take 1 tablet (5 mg total) by mouth 2 (two) times daily. 60 tablet   . aspirin EC 81 MG tablet Take 81 mg by mouth daily.    Marland Kitchen b complex vitamins tablet Take 1 tablet by mouth daily.    . bisacodyl (DULCOLAX) 10 MG suppository Place 1 suppository (10 mg total) rectally daily as needed for moderate constipation. 12 suppository 0  . Carbonyl Iron (IRON CHEWS PEDIATRIC PO) Take 5 mg by mouth daily.    . Cholecalciferol (VITAMIN D3) 2000  units capsule Take 1,000 Units by mouth daily.     . clotrimazole (LOTRIMIN) 1 % cream Apply topically 2 (two) times daily. 30 g 0  . dextromethorphan 15 MG/5ML syrup Take 10 mLs by mouth 4 (four) times daily as needed for cough.    . diclofenac Sodium (VOLTAREN) 1 % GEL Apply topically 4 (four) times daily.    . DULoxetine (CYMBALTA) 60 MG capsule Take 60 mg by mouth daily.     Marland Kitchen guaiFENesin-dextromethorphan (ROBITUSSIN DM) 100-10 MG/5ML syrup Take 5 mLs by mouth every 4 (four) hours as needed for cough. 118 mL 0  . HYDROcodone-acetaminophen (NORCO) 5-325 MG tablet Take 1/2 - 1 tablet by mouth every 6 hours as needed for severe pain. 30 tablet 0  . ipratropium-albuterol (DUONEB) 0.5-2.5 (3) MG/3ML SOLN Take 3 mLs by nebulization every 4 (four) hours as needed. 360 mL 3  . lidocaine (XYLOCAINE) 2 % solution Use as directed 15  mLs in the mouth or throat 3 (three) times daily as needed for mouth pain.  0  . lidocaine-prilocaine (EMLA) cream Apply cream 1 hour before chemotherapy treatment and place small peive of saran wrap over cream to protect clothing 30 g 3  . magic mouthwash SOLN Take 15 mLs by mouth 3 (three) times daily.  0  . metoprolol tartrate (LOPRESSOR) 50 MG tablet Take 1 tablet (50 mg total) by mouth 2 (two) times daily.    . Omega-3 Fatty Acids (FISH OIL PO) Take 1 tablet by mouth daily.    . ondansetron (ZOFRAN ODT) 4 MG disintegrating tablet Take 1 tablet (4 mg total) by mouth every 8 (eight) hours as needed for nausea or vomiting. 30 tablet 1  . pantoprazole (PROTONIX) 40 MG tablet Take 1 tablet (40 mg total) by mouth daily. 30 tablet 2  . polyethylene glycol (MIRALAX / GLYCOLAX) 17 g packet Take 17 g by mouth 2 (two) times daily. 14 each 0  . vitamin E 400 UNIT capsule Take 400 Units by mouth daily.     Marland Kitchen atorvastatin (LIPITOR) 80 MG tablet Take 80 mg by mouth every evening.      No current facility-administered medications for this visit.   Facility-Administered Medications  Ordered in Other Visits  Medication Dose Route Frequency Provider Last Rate Last Admin  . heparin lock flush 100 unit/mL  500 Units Intravenous Once Kurt Azimi C, MD      . heparin lock flush 100 unit/mL  500 Units Intravenous Once Emmalene Kattner C, MD      . sodium chloride flush (NS) 0.9 % injection 10 mL  10 mL Intravenous PRN Nolon Stalls C, MD   10 mL at 04/05/17 1034  . sodium chloride flush (NS) 0.9 % injection 10 mL  10 mL Intravenous PRN Lequita Asal, MD   10 mL at 02/27/19 1033  . sodium chloride flush (NS) 0.9 % injection 10 mL  10 mL Intravenous PRN Lequita Asal, MD        Review of Systems  Constitutional: Positive for malaise/fatigue (majority of the day) and weight loss (5 lbs). Negative for chills, diaphoresis (resolved) and fever.       Feels tired and sleepy. Currently in rehab to regain strength.  HENT: Positive for hearing loss (deaf). Negative for congestion and sore throat.   Eyes: Negative for blurred vision and double vision.  Respiratory: Positive for cough and shortness of breath (with minimal exertion). Negative for hemoptysis and sputum production.        On 2 liters of oxygen.  Cardiovascular: Negative for chest pain, palpitations and leg swelling.  Gastrointestinal: Positive for constipation. Negative for abdominal pain, blood in stool, diarrhea, heartburn (reflux), melena, nausea (on zofran as needed) and vomiting.       Early satiety. Drinking protein shakes and V8.  Genitourinary: Negative for dysuria, frequency and urgency.       Urinating 3-4 times per day.  Musculoskeletal: Positive for joint pain (chronic; left knee). Negative for back pain (lower back), falls and myalgias.  Skin: Negative for itching and rash.       Lip swelling from gemcitabine.  Neurological: Positive for sensory change (baseline neuropathy unchanged; left hand 2nd and 3rd digits. neuropathy in sole of feet unchanged). Negative for dizziness, weakness and  headaches.  Endo/Heme/Allergies: Negative for environmental allergies and polydipsia. Does not bruise/bleed easily.       Hot flashes at times  Psychiatric/Behavioral: Positive for depression (at  rehab center). Negative for memory loss. The patient is not nervous/anxious and does not have insomnia.   All other systems reviewed and are negative.  Performance status (ECOG):  2-3  Vitals Blood pressure (!) 122/95, pulse (!) 104, temperature (!) 95.5 F (35.3 C), temperature source Tympanic, resp. rate 16, weight 158 lb (71.7 kg), SpO2 98 %.   Physical Exam Vitals and nursing note reviewed.  Constitutional:      Appearance: Normal appearance. She is well-developed. She is not diaphoretic.     Interventions: Face mask in place.     Comments: Patient is sitting comfortably in the exam room in no acute distress.  She is examined in wheelchair.  HENT:     Head: Normocephalic and atraumatic.     Nose:     Comments: Roscoe in place.    Mouth/Throat:     Mouth: No oral lesions.     Pharynx: No oropharyngeal exudate.  Eyes:     General: No scleral icterus.       Right eye: No discharge.        Left eye: No discharge.     Conjunctiva/sclera: Conjunctivae normal.     Pupils: Pupils are equal, round, and reactive to light.     Comments: Hazel eyes.  Neck:     Vascular: No JVD.  Cardiovascular:     Rate and Rhythm: Regular rhythm.     Heart sounds: Normal heart sounds. No murmur heard.  No friction rub. No gallop.   Pulmonary:     Effort: Pulmonary effort is normal.     Breath sounds: Examination of the left-middle field reveals decreased breath sounds. Examination of the left-lower field reveals decreased breath sounds. Decreased breath sounds present. No wheezing or rhonchi.  Abdominal:     General: Bowel sounds are normal. There is no distension.     Palpations: Abdomen is soft. There is no mass.     Tenderness: There is no abdominal tenderness. There is no guarding or rebound.    Musculoskeletal:        General: No tenderness. Normal range of motion.     Cervical back: Normal range of motion and neck supple.     Left knee: No swelling.  Lymphadenopathy:     Head:     Right side of head: No preauricular, posterior auricular or occipital adenopathy.     Left side of head: No preauricular, posterior auricular or occipital adenopathy.     Cervical: No cervical adenopathy.     Upper Body:     Right upper body: No supraclavicular adenopathy.     Left upper body: No supraclavicular adenopathy.     Lower Body: No right inguinal adenopathy. No left inguinal adenopathy.  Skin:    General: Skin is warm and dry.     Coloration: Skin is not pale.     Findings: No bruising, erythema, lesion or rash.  Neurological:     Mental Status: She is alert and oriented to person, place, and time.  Psychiatric:        Behavior: Behavior normal.        Thought Content: Thought content normal.        Judgment: Judgment normal.    Imaging studies: 03/03/2016:  PET scanrevealed a hypermetabolic left lower lobe mass, bilateral pleural and pulmonary parenchymal nodular metastasis, a large pleural lesion invading the anterior left chest wall. There was activity in the superior left ocular orbit. There was a large lesion centered in the  right iliac wing with soft tissue extension. There were hypermetabolic right external iliac lymph nodes 01/29/2017:  PET scanrevealed interval progression of hypermetabolic nodules in the right upper (13 mm compared to 9 mm; SUV 10) and left lower lobes (26 mm compared to 22 mm; SUV 7.5). There was new hypermetabolic focus of activity along the anterior left pleura although no underlying pleural or lung mass was evident. 06/06/2019:  Chest, abdomen, and pelvis CTrevealed an interval mass-like opacity in the superior aspect of the left parahilar lungwith an interval more prominent mass-like component involving the pleura laterally at the level with  slightly more prominent extension into the left hila region, suspicious for recurrent or progressive lung cancer. There was mediastinal and bilateral hilar adenopathyc/wmetastatic adenopathy or involvement with the patient's B-cell lymphoma. There was no evidence of metastatic disease or lymphoma involving the abdomen or pelvis. There was significantly improved ground-glass opacities in the right lung, minimal left pleural fluid(improved), andastable large hiatal hernia.There was stable enlarged thyroid gland containing heterogeneous nodules with substernal extension on the left.There was stable to slightly decreased size of the previously demonstrated right adrenal nodule and no significant change in a small, poorly visualized left adrenal nodule. 06/17/2019:  PET scanrevealed a known left parahilar lung mass markedly hypermetabolic with associated hypermetabolic lymphadenopathy in both hilar regions, the mediastinum, andthe right thoracic inlet. There was hypermetabolism in the right submandibular gland, indeterminate. There was no hypermetabolic metastatic disease evident in the abdomen or pelvis. There was a stable bilateral adrenal adenomas since 01/29/2018. There were tiny left pleural effusion and a large hiatal hernia.There was a segment of transverse colon extendingup into the hiatal hernia without obstructive features.  12/02/2019:  Chest, abdomen and pelvis CT revealed interval resolution of mediastinal and hilar adenopathy c/w response to therapy. 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 There was increased moderate-sized dependent left pleural effusion with increased left lower lobe compressive atelectasis. There was a new 6 mm nodule in the superior segment of the right lower lobe, nonspecific, but potentially a metastasis. Attention on follow-up was recommended.  There was no  evidence of metastatic disease in the abdomen or pelvis.  There was a moderate-sized hiatal hernia and bilateral adrenal adenomas.    Appointment on 01/14/2020  Component Date Value Ref Range Status  . WBC 01/14/2020 9.8  4.0 - 10.5 K/uL Final  . RBC 01/14/2020 3.21* 3.87 - 5.11 MIL/uL Final  . Hemoglobin 01/14/2020 9.9* 12.0 - 15.0 g/dL Final  . HCT 01/14/2020 30.5* 36 - 46 % Final  . MCV 01/14/2020 95.0  80.0 - 100.0 fL Final  . MCH 01/14/2020 30.8  26.0 - 34.0 pg Final  . MCHC 01/14/2020 32.5  30.0 - 36.0 g/dL Final  . RDW 01/14/2020 18.2* 11.5 - 15.5 % Final  . Platelets 01/14/2020 210  150 - 400 K/uL Final  . nRBC 01/14/2020 0.0  0.0 - 0.2 % Final  . Neutrophils Relative % 01/14/2020 82  % Final  . Neutro Abs 01/14/2020 8.0* 1.7 - 7.7 K/uL Final  . Lymphocytes Relative 01/14/2020 7  % Final  . Lymphs Abs 01/14/2020 0.7  0.7 - 4.0 K/uL Final  . Monocytes Relative 01/14/2020 10  % Final  . Monocytes Absolute 01/14/2020 0.9  0 - 1 K/uL Final  . Eosinophils Relative 01/14/2020 0  % Final  . Eosinophils Absolute 01/14/2020 0.0  0 - 0 K/uL Final  . Basophils Relative 01/14/2020 0  %  Final  . Basophils Absolute 01/14/2020 0.0  0 - 0 K/uL Final  . Immature Granulocytes 01/14/2020 1  % Final  . Abs Immature Granulocytes 01/14/2020 0.14* 0.00 - 0.07 K/uL Final   Performed at [facility], [address] [place], [place] [address]    Assessment:  Toni Griffith is a 79 y.o. female withstage IVB B-cell lymphoma, unclassifiable, with features intermediate between diffuse large B-cell lymphoma and classical Hodgkin's lymphoma ("gray zone" lymphoma). She underwent right iliac wing bone/bone marrow biopsy on 03/08/2016.  PET scanon 03/03/2016 revealed a hypermetabolic left lower lobe mass, bilateral pleural and pulmonary parenchymal nodular metastasis, a large pleural lesion invading the anterior left chest wall. There was activity in the superior left ocular orbit.  There was a large lesion centered in the right iliac wing with soft tissue extension. There were hypermetabolic right external iliac lymph nodes  [place]oon 04/04/2016 revealed an EF of 55-60%. Hepatitis B and C testing was negative on 03/30/2016. G6PD assay was normal. She declined LP with MTX prophylaxis.  She received 6 cycles of mini-RCHOP(04/07/2016 - 07/21/2016). She has had some transient fingertip numbness.  She received radiationto the right iliac wing, from 10/11/2016 - 11/01/2016. She received 3000 cGy over 3 weeks.  She has squamous cell lung cancer. CT guided left lower lobe nodule biopsyon 02/19/2017 confirmed squamous cell carcinoma. She underwent SBRT08/27/2018 - 04/09/2017. She received 5000 cGy in 5 fractions to the left lower lobe lesion. She underwent SBRTto a RUL lesion from 05/23/2017 - 06/18/2017.  CT guided left upper lobe nodule biopsyon 02/20/2018 revealedsquamous cell lung cancer. She received SBRTfrom 04/09/2018 - 04/15/2018. She received 6000 cGy in 5 fractions.   Foundation Oneon the lung biopsy on 03/15/2018 revealed MS-stable, tumor mutational burden 5 Muts/Mb, AKT2 amplification, CCNE1 amplification, CREBBP R1446L, KDR amplification, KIT amplification, PDGFRA amplification, PTEN loss exons 2-4, and NW29 splice site 562+1H>Y. Marland Kitchen There were no alterations in EGFR, KRAS, RET, ALK, MET, ERBB2, BRAF, ROS1. PDL-1IHC analysis revealed a TPS score of 50%.  Bronchoscopyon 05/30/2018 revealed no endobronchial lesions. The 2 cm pre-carinal lymph node was biopsied. Pathology revealed metastatic non-small cell carcinoma, favor squamous cell carcinoma.  CT guided right paraspinal lesion biopsyon 10/16/2017 was compatible with involvement by the patient's previously diagnosed lymphoma. The case was referred to [facility] hematopathology. The neoplastic cells were largely CD30 negative. Repeat CD30 immunohistochemistry faintly stained >10% of the large  atypical cells. CD20 stained the scattered large atypical cells and few small cells.   She received 3 cycles of brentuximab vedotin(11/23/2017 - 01/11/2018).Her lymphoma progressed.  Shereceived14cycles of pembrolizumab(06/14/2018 -05/21/2019).   She received 4 cycles ofpembolizumab, carboplatin and Abraxane (08/29/2019 - 11/05/2019). She experienced tinnitus after her first day of chemotherapy. Tinnitus was minor with cycle #2.  Shereceived4000cGyto the left hilar regionfrom12/14/2020 -08/15/2019.  Chest, abdomen and pelvis CT on 12/02/2019 revealed interval resolution of mediastinal and hilar adenopathy c/w response to therapy. 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 There was increased moderate-sized dependent left pleural effusion with increased left lower lobe compressive atelectasis. There was a new 6 mm nodule in the superior segment of the right lower lobe, nonspecific, but potentially a metastasis. Attention on follow-up was recommended.  There was no evidence of metastatic disease in the abdomen or pelvis.  There was a moderate-sized hiatal hernia and bilateral adrenal adenomas.   Ultrasound guided left thoracentesis on 12/05/2019 revealed non-small cell carcinoma.   She hasiron deficiency anemia. Ferritinhas  been followed: 14 on 05/06/2018,23 on 07/26/2018, and 59 on 11/01/2018. She denies any bleeding.  She has a grade I neuropathy. B12 and folate were normal on 12/06/2017. She has severe bilateral knee osteoarthritis. She has received steroid injections and Monovisc (hyaluronate derivatives) without improvement.   She was admitted to [facility] from 12/09/2019-12/11/2019 with chills, diaphoresis, tachycardia, and tachypnea for r/o sepsis. She was treated with broad spectrum antibiotics (vancomycin and Cefepime). Cultures remained negative.   She was discharged on Augmentin.  She was switched to Levaquin on 12/25/2019.  She was admitted to [facility] from 01/03/2020 - 01/09/2020 with swollen lips and mouth burning felt secondary to medication (? gemcitabine), chronic respiratory failure, pneumonia, and new onset A. fib with RVR.  She was treated with Cefepime then switched to Cefdinir, Eliquis, and metoprolol.  She was discharged to rehabilitation.  She is deafand requires an interpreter.  Symptomatically, she remains fatigued since her hospitalization.  Exam reveals decreased breath sounds in the left mid lung field.  Plan: 1.   Labs today: CBC with diff, CMP, Mg. 2.Gray zone lymphoma Clinically, her lymphoma is controlled. Exam reveals no adenopathy or hepatosplenomegaly She has received 6 cycles of mini-RCOP, 3 cycles ofbrentuximab vedotin, and 16cycles of pembrolizumab. PET scan on 06/17/2019 revealed progressive disease in the chest felt secondary toprogressive lung cancer. She is s/p5additional cycle of maintenance pembrolizumab (last 11/05/2019). Discuss possible future pembrolizumab for maintenance treatment for recurrent gray zone lymphoma Treatment currently on hold. 3. 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 Patient is s/p SBRT to left upper lobe and left lower lobe nodules. Patient is s/p 15cyclesof pembrolizumab. Patient is s/p palliative radiation to the right hilar region (completed 08/15/2019). She is s/p4cycles of carboplatin and Abraxane (08/29/2019 - 11/05/2019). Chest, abdomen and pelvis CT scan on 12/02/2019 revealed a new moderate left-sided effusion.  Thoracentesis on 12/05/2019 confirmed non-small cell lung cancer.  No clinical trial available at [facility].  Recommendation for single agent chemotherapy.  Patient did poorly with gemcitabine and does not wish any further doses.  Patient  plans to pursue navelbine in the near future. 4.Recurrent left sided pleural effusion  Patient is s/p thoracentesis on 12/05/2019 which confirmed non-small cell lung cancer.  Patient was seen by pulmonary medicine at [facility] on 12/23/2019 for consideration of pleurodesis vs Pleur-X catheter.  Patient seen by Dr Patsey Berthold on 12/31/2019.  Plan for Pleur-X catheter if fluid reaccumulates.  Per conversation with Dr Patsey Berthold, stent would not be benificial as obstruction is too distal.  Patient followed by Dr. Patsey Berthold. 5.   Post obstructive pneumonia  Clinically, she is stable.  She has been on several courses of antibiotics 6.   Chemotherapy-induced neuropathy Neuropathy remains stable. 7. Cancer related pain  Patient denies any significant pain.  Continue to monitor closely 8.   Hypomagnesemia Magnesium 1.5.  Magnesium 2 g IV today. 9.   Hypercalcemia  Calcium 10.5 (corrected 11.86).  Etiology secondary to hypercalcemia of malignancy.  Zometa 4 mg IV today. 10.   Code status  Code status is FULL. 11.   RTC in 1 week for MD assessment, labs (CBC with diff, CMP, Mg) and day 1 of cycle #1 [place].  I discussed the assessment and treatment plan with the patient.  The patient was provided an opportunity to ask questions and all were answered.  The patient agreed with the plan and demonstrated an understanding of the instructions.  The  patient was advised to call back if the symptoms worsen or if the condition fails to improve as anticipated.  I provided 30 minutes of face-to-face time during this this encounter and > 50% was spent counseling as documented under my assessment and plan.     Lequita Asal, MD, PhD    01/14/2020, 9:59 AM  I, Selena Batten, am acting as scribe for [employer]. Mike Gip, MD, PhD.  I, Toryn Dewalt C. Mike Gip, MD, have reviewed the above documentation for accuracy and completeness, and I agree with the above.

## 2020-01-12 NOTE — Telephone Encounter (Signed)
ONO reviewed by Dr. Patsey Berthold- recommend 2L QHS. Called and spoke to pt's daughter, [name](DPR) and relayed ONO results.  [name] stated that pt had recent admission and was discharged on oxygen.  [name] is unaware of who provides pt with oxygen, however she will call us back with name of DME so our records can be updated.

## 2020-01-13 NOTE — Progress Notes (Signed)
Patient daughter stated that she is on oxygen now. She is now in the a rehab facility and was just released from the hospital on Friday January 09, 2020.

## 2020-01-14 ENCOUNTER — Inpatient Hospital Stay: Payer: Medicare HMO

## 2020-01-14 ENCOUNTER — Other Ambulatory Visit: Payer: Self-pay

## 2020-01-14 ENCOUNTER — Ambulatory Visit: Payer: Medicare HMO | Admitting: Family

## 2020-01-14 ENCOUNTER — Encounter: Payer: Self-pay | Admitting: Hematology and Oncology

## 2020-01-14 ENCOUNTER — Inpatient Hospital Stay (HOSPITAL_BASED_OUTPATIENT_CLINIC_OR_DEPARTMENT_OTHER): Payer: Medicare HMO | Admitting: Hematology and Oncology

## 2020-01-14 VITALS — BP 118/71 | HR 103 | Temp 97.7°F | Resp 20

## 2020-01-14 VITALS — BP 122/95 | HR 104 | Temp 95.5°F | Resp 16 | Wt 158.0 lb

## 2020-01-14 DIAGNOSIS — J440 Chronic obstructive pulmonary disease with acute lower respiratory infection: Secondary | ICD-10-CM | POA: Diagnosis not present

## 2020-01-14 DIAGNOSIS — C3492 Malignant neoplasm of unspecified part of left bronchus or lung: Secondary | ICD-10-CM

## 2020-01-14 DIAGNOSIS — C8518 Unspecified B-cell lymphoma, lymph nodes of multiple sites: Secondary | ICD-10-CM | POA: Diagnosis not present

## 2020-01-14 DIAGNOSIS — Z7189 Other specified counseling: Secondary | ICD-10-CM

## 2020-01-14 DIAGNOSIS — J189 Pneumonia, unspecified organism: Secondary | ICD-10-CM | POA: Diagnosis not present

## 2020-01-14 DIAGNOSIS — I110 Hypertensive heart disease with heart failure: Secondary | ICD-10-CM | POA: Diagnosis not present

## 2020-01-14 DIAGNOSIS — C3412 Malignant neoplasm of upper lobe, left bronchus or lung: Secondary | ICD-10-CM | POA: Diagnosis not present

## 2020-01-14 DIAGNOSIS — D3501 Benign neoplasm of right adrenal gland: Secondary | ICD-10-CM | POA: Diagnosis not present

## 2020-01-14 DIAGNOSIS — Z5111 Encounter for antineoplastic chemotherapy: Secondary | ICD-10-CM | POA: Diagnosis present

## 2020-01-14 DIAGNOSIS — C7951 Secondary malignant neoplasm of bone: Secondary | ICD-10-CM

## 2020-01-14 DIAGNOSIS — E860 Dehydration: Secondary | ICD-10-CM | POA: Diagnosis not present

## 2020-01-14 DIAGNOSIS — F329 Major depressive disorder, single episode, unspecified: Secondary | ICD-10-CM | POA: Diagnosis not present

## 2020-01-14 DIAGNOSIS — I509 Heart failure, unspecified: Secondary | ICD-10-CM | POA: Diagnosis not present

## 2020-01-14 DIAGNOSIS — T451X5A Adverse effect of antineoplastic and immunosuppressive drugs, initial encounter: Secondary | ICD-10-CM | POA: Diagnosis not present

## 2020-01-14 DIAGNOSIS — C3432 Malignant neoplasm of lower lobe, left bronchus or lung: Secondary | ICD-10-CM | POA: Diagnosis not present

## 2020-01-14 DIAGNOSIS — J9610 Chronic respiratory failure, unspecified whether with hypoxia or hypercapnia: Secondary | ICD-10-CM | POA: Diagnosis not present

## 2020-01-14 DIAGNOSIS — I4891 Unspecified atrial fibrillation: Secondary | ICD-10-CM | POA: Diagnosis not present

## 2020-01-14 DIAGNOSIS — J90 Pleural effusion, not elsewhere classified: Secondary | ICD-10-CM | POA: Diagnosis not present

## 2020-01-14 DIAGNOSIS — G893 Neoplasm related pain (acute) (chronic): Secondary | ICD-10-CM | POA: Diagnosis not present

## 2020-01-14 DIAGNOSIS — C8330 Diffuse large B-cell lymphoma, unspecified site: Secondary | ICD-10-CM | POA: Diagnosis present

## 2020-01-14 DIAGNOSIS — M25562 Pain in left knee: Secondary | ICD-10-CM

## 2020-01-14 DIAGNOSIS — M170 Bilateral primary osteoarthritis of knee: Secondary | ICD-10-CM | POA: Diagnosis not present

## 2020-01-14 DIAGNOSIS — H9190 Unspecified hearing loss, unspecified ear: Secondary | ICD-10-CM | POA: Diagnosis not present

## 2020-01-14 DIAGNOSIS — K449 Diaphragmatic hernia without obstruction or gangrene: Secondary | ICD-10-CM | POA: Diagnosis not present

## 2020-01-14 DIAGNOSIS — G620 Drug-induced polyneuropathy: Secondary | ICD-10-CM | POA: Diagnosis not present

## 2020-01-14 DIAGNOSIS — D509 Iron deficiency anemia, unspecified: Secondary | ICD-10-CM | POA: Diagnosis not present

## 2020-01-14 DIAGNOSIS — D3502 Benign neoplasm of left adrenal gland: Secondary | ICD-10-CM | POA: Diagnosis not present

## 2020-01-14 LAB — CBC WITH DIFFERENTIAL/PLATELET
Abs Immature Granulocytes: 0.14 10*3/uL — ABNORMAL HIGH (ref 0.00–0.07)
Basophils Absolute: 0 10*3/uL (ref 0.0–0.1)
Basophils Relative: 0 %
Eosinophils Absolute: 0 10*3/uL (ref 0.0–0.5)
Eosinophils Relative: 0 %
HCT: 30.5 % — ABNORMAL LOW (ref 36.0–46.0)
Hemoglobin: 9.9 g/dL — ABNORMAL LOW (ref 12.0–15.0)
Immature Granulocytes: 1 %
Lymphocytes Relative: 7 %
Lymphs Abs: 0.7 10*3/uL (ref 0.7–4.0)
MCH: 30.8 pg (ref 26.0–34.0)
MCHC: 32.5 g/dL (ref 30.0–36.0)
MCV: 95 fL (ref 80.0–100.0)
Monocytes Absolute: 0.9 10*3/uL (ref 0.1–1.0)
Monocytes Relative: 10 %
Neutro Abs: 8 10*3/uL — ABNORMAL HIGH (ref 1.7–7.7)
Neutrophils Relative %: 82 %
Platelets: 210 10*3/uL (ref 150–400)
RBC: 3.21 MIL/uL — ABNORMAL LOW (ref 3.87–5.11)
RDW: 18.2 % — ABNORMAL HIGH (ref 11.5–15.5)
WBC: 9.8 10*3/uL (ref 4.0–10.5)
nRBC: 0 % (ref 0.0–0.2)

## 2020-01-14 LAB — COMPREHENSIVE METABOLIC PANEL
ALT: 22 U/L (ref 0–44)
AST: 21 U/L (ref 15–41)
Albumin: 2.4 g/dL — ABNORMAL LOW (ref 3.5–5.0)
Alkaline Phosphatase: 78 U/L (ref 38–126)
Anion gap: 7 (ref 5–15)
BUN: 16 mg/dL (ref 8–23)
CO2: 27 mmol/L (ref 22–32)
Calcium: 10.5 mg/dL — ABNORMAL HIGH (ref 8.9–10.3)
Chloride: 97 mmol/L — ABNORMAL LOW (ref 98–111)
Creatinine, Ser: 0.77 mg/dL (ref 0.44–1.00)
GFR calc Af Amer: >60 mL/min (ref >60)
GFR calc non Af Amer: >60 mL/min (ref >60)
Glucose, Bld: 137 mg/dL — ABNORMAL HIGH (ref 70–99)
Potassium: 3.4 mmol/L — ABNORMAL LOW (ref 3.5–5.1)
Sodium: 131 mmol/L — ABNORMAL LOW (ref 135–145)
Total Bilirubin: 0.6 mg/dL (ref 0.3–1.2)
Total Protein: 6.3 g/dL — ABNORMAL LOW (ref 6.5–8.1)

## 2020-01-14 LAB — MAGNESIUM: Magnesium: 1.5 mg/dL — ABNORMAL LOW (ref 1.7–2.4)

## 2020-01-14 MED ORDER — SODIUM CHLORIDE 0.9 % IV SOLN
Freq: Once | INTRAVENOUS | Status: AC
  Filled 2020-01-14: qty 250

## 2020-01-14 MED ORDER — MAGNESIUM SULFATE 2 GM/50ML IV SOLN
2.0000 g | Freq: Once | INTRAVENOUS | Status: AC
  Administered 2020-01-14: 2 g via INTRAVENOUS

## 2020-01-14 MED ORDER — SODIUM CHLORIDE 0.9% FLUSH
10.0000 mL | INTRAVENOUS | Status: DC | PRN
  Administered 2020-01-14: 10 mL via INTRAVENOUS
  Filled 2020-01-14: qty 10

## 2020-01-14 MED ORDER — ZOLEDRONIC ACID 4 MG/100ML IV SOLN
4.0000 mg | Freq: Once | INTRAVENOUS | Status: AC
  Administered 2020-01-14: 4 mg via INTRAVENOUS

## 2020-01-14 MED ORDER — ACETAMINOPHEN 325 MG PO TABS
650.0000 mg | ORAL_TABLET | Freq: Once | ORAL | Status: AC
  Administered 2020-01-14: 650 mg via ORAL

## 2020-01-14 MED ORDER — HEPARIN SOD (PORK) LOCK FLUSH 100 UNIT/ML IV SOLN
500.0000 [IU] | Freq: Once | INTRAVENOUS | Status: AC
  Administered 2020-01-14: 500 [IU] via INTRAVENOUS
  Filled 2020-01-14: qty 5

## 2020-01-14 NOTE — Patient Instructions (Signed)
Zoledronic Acid injection (Hypercalcemia, Oncology) What is this medicine? ZOLEDRONIC ACID (ZOE le dron ik AS id) lowers the amount of calcium loss from bone. It is used to treat too much calcium in your blood from cancer. It is also used to prevent complications of cancer that has spread to the bone. This medicine may be used for other purposes; ask your health care provider or pharmacist if you have questions. COMMON BRAND NAME(S): Zometa What should I tell my health care provider before I take this medicine? They need to know if you have any of these conditions:  aspirin-sensitive asthma  cancer, especially if you are receiving medicines used to treat cancer  dental disease or wear dentures  infection  kidney disease  receiving corticosteroids like dexamethasone or prednisone  an unusual or allergic reaction to zoledronic acid, other medicines, foods, dyes, or preservatives  pregnant or trying to get pregnant  breast-feeding How should I use this medicine? This medicine is for infusion into a vein. It is given by a health care professional in a hospital or clinic setting. Talk to your pediatrician regarding the use of this medicine in children. Special care may be needed. Overdosage: If you think you have taken too much of this medicine contact a poison control center or emergency room at once. NOTE: This medicine is only for you. Do not share this medicine with others. What if I miss a dose? It is important not to miss your dose. Call your doctor or health care professional if you are unable to keep an appointment. What may interact with this medicine?  certain antibiotics given by injection  NSAIDs, medicines for pain and inflammation, like ibuprofen or naproxen  some diuretics like bumetanide, furosemide  teriparatide  thalidomide This list may not describe all possible interactions. Give your health care provider a list of all the medicines, herbs, non-prescription  drugs, or dietary supplements you use. Also tell them if you smoke, drink alcohol, or use illegal drugs. Some items may interact with your medicine. What should I watch for while using this medicine? Visit your doctor or health care professional for regular checkups. It may be some time before you see the benefit from this medicine. Do not stop taking your medicine unless your doctor tells you to. Your doctor may order blood tests or other tests to see how you are doing. Women should inform their doctor if they wish to become pregnant or think they might be pregnant. There is a potential for serious side effects to an unborn child. Talk to your health care professional or pharmacist for more information. You should make sure that you get enough calcium and vitamin D while you are taking this medicine. Discuss the foods you eat and the vitamins you take with your health care professional. Some people who take this medicine have severe bone, joint, and/or muscle pain. This medicine may also increase your risk for jaw problems or a broken thigh bone. Tell your doctor right away if you have severe pain in your jaw, bones, joints, or muscles. Tell your doctor if you have any pain that does not go away or that gets worse. Tell your dentist and dental surgeon that you are taking this medicine. You should not have major dental surgery while on this medicine. See your dentist to have a dental exam and fix any dental problems before starting this medicine. Take good care of your teeth while on this medicine. Make sure you see your dentist for regular follow-up  appointments. What side effects may I notice from receiving this medicine? Side effects that you should report to your doctor or health care professional as soon as possible:  allergic reactions like skin rash, itching or hives, swelling of the face, lips, or tongue  anxiety, confusion, or depression  breathing problems  changes in vision  eye  pain  feeling faint or lightheaded, falls  jaw pain, especially after dental work  mouth sores  muscle cramps, stiffness, or weakness  redness, blistering, peeling or loosening of the skin, including inside the mouth  trouble passing urine or change in the amount of urine Side effects that usually do not require medical attention (report to your doctor or health care professional if they continue or are bothersome):  bone, joint, or muscle pain  constipation  diarrhea  fever  hair loss  irritation at site where injected  loss of appetite  nausea, vomiting  stomach upset  trouble sleeping  trouble swallowing  weak or tired This list may not describe all possible side effects. Call your doctor for medical advice about side effects. You may report side effects to FDA at 1-800-FDA-1088. Where should I keep my medicine? This drug is given in a hospital or clinic and will not be stored at home. NOTE: This sheet is a summary. It may not cover all possible information. If you have questions about this medicine, talk to your doctor, pharmacist, or health care provider.  2020 Elsevier/Gold Standard (2013-12-13 14:19:39) Magnesium Sulfate injection What is this medicine? MAGNESIUM SULFATE (mag NEE zee um SUL fate) is an electrolyte injection commonly used to treat low magnesium levels in your blood. It is also used to prevent or control seizures in women with preeclampsia or eclampsia. This medicine may be used for other purposes; ask your health care provider or pharmacist if you have questions. What should I tell my health care provider before I take this medicine? They need to know if you have any of these conditions:  heart disease  history of irregular heart beat  kidney disease  an unusual or allergic reaction to magnesium sulfate, medicines, foods, dyes, or preservatives  pregnant or trying to get pregnant  breast-feeding How should I use this medicine? This  medicine is for infusion into a vein. It is given by a health care professional in a hospital or clinic setting. Talk to your pediatrician regarding the use of this medicine in children. While this drug may be prescribed for selected conditions, precautions do apply. Overdosage: If you think you have taken too much of this medicine contact a poison control center or emergency room at once. NOTE: This medicine is only for you. Do not share this medicine with others. What if I miss a dose? This does not apply. What may interact with this medicine? This medicine may interact with the following medications:  certain medicines for anxiety or sleep  certain medicines for seizures like phenobarbital  digoxin  medicines that relax muscles for surgery  narcotic medicines for pain This list may not describe all possible interactions. Give your health care provider a list of all the medicines, herbs, non-prescription drugs, or dietary supplements you use. Also tell them if you smoke, drink alcohol, or use illegal drugs. Some items may interact with your medicine. What should I watch for while using this medicine? Your condition will be monitored carefully while you are receiving this medicine. You may need blood work done while you are receiving this medicine. What side effects may  I notice from receiving this medicine? Side effects that you should report to your doctor or health care professional as soon as possible:  allergic reactions like skin rash, itching or hives, swelling of the face, lips, or tongue  facial flushing  muscle weakness  signs and symptoms of low blood pressure like dizziness; feeling faint or lightheaded, falls; unusually weak or tired  signs and symptoms of a dangerous change in heartbeat or heart rhythm like chest pain; dizziness; fast or irregular heartbeat; palpitations; breathing problems  sweating This list may not describe all possible side effects. Call your  doctor for medical advice about side effects. You may report side effects to FDA at 1-800-FDA-1088. Where should I keep my medicine? This drug is given in a hospital or clinic and will not be stored at home. NOTE: This sheet is a summary. It may not cover all possible information. If you have questions about this medicine, talk to your doctor, pharmacist, or health care provider.  2020 Elsevier/Gold Standard (2016-02-02 12:31:42)

## 2020-01-14 NOTE — Progress Notes (Signed)
Patient ID: Toni Griffith, female    DOB: 1940/08/19, 79 y.o.   MRN: [id number]  Entire visit done in with sign language interpreter present  HPI  Toni Griffith is a 79 y/o female with a history of lung cancer, HTN, GERD, COPD, previous tobacco use, completely deaf and chronic heart failure.   Echo report from 01/04/20 reviewed and showed an EF of 60-65% along with mild LVH, moderately elevated PA pressure and mild/moderate TR.   Admitted 01/03/20 due to shortness of breath along with medication allergy. Cardiology and oncology consults obtained. Diagnosed with pneumonia which was treated with antibiotics. Blood cultures negative. Chest CT negative for PE. Worsening lung mass. New onset AF with RVR with initiation of apixaban. Discharged after 6 days.    She presents today for her initial visit with a chief complaint of minimal shortness of breath upon moderate exertion. She does feel like it's improved some since wearing oxygen. She has associated fatigue, decreased appetite, cough, healing mouth sores, left knee pain and some depression along with this. She denies any difficulty sleeping, dizziness, abdominal distention, palpitations, pedal edema or chest pain.   Says that she's not getting weighed daily at the facility and PT is supposed to be starting.   Past Medical History:  Diagnosis Date  . Arthritis   . Cancer [facility])    lymphoma-right hip  . CHF (congestive heart failure) ([place])   . COPD (chronic obstructive pulmonary disease) ([place])   . Deaf   . GERD (gastroesophageal reflux disease)   . Hypertension    NOT ON MED AT PRESENT  . 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 CARDIAC SURGERY  1993  . CORONARY ARTERY BYPASS GRAFT     DOUBLE  . CYST REMOVAL HAND Right   . ENDOBRONCHIAL ULTRASOUND N/A 05/30/2018   Procedure: ENDOBRONCHIAL ULTRASOUND;  Surgeon: Tyler Pita, MD;  Location: [facility] ORS;  Service: Cardiopulmonary;   Laterality: N/A;  . PERIPHERAL VASCULAR CATHETERIZATION N/A 04/06/2016   Procedure: Glori Luis Cath Insertion;  Surgeon: Algernon Huxley, MD;  Location: [place] CV LAB;  Service: Cardiovascular;  Laterality: N/A;   Family History  Problem Relation Age of Onset  . Lung cancer Mother   . Lung cancer Father   . Diabetes Maternal Uncle   . Myasthenia gravis Maternal Grandmother   . Leukemia Maternal Grandfather    Social History   Tobacco Use  . Smoking status: Former Smoker    Packs/day: 2.00    Years: 40.00    Pack years: 80.00    Types: Cigarettes    Quit date: 11/21/2015    Years since quitting: 4.1  . Smokeless tobacco: Never Used  Substance Use Topics  . Alcohol use: Yes    Comment: rare   No Known Allergies Prior to Admission medications   Medication Sig Start Date End Date Taking? Authorizing Provider  acetaminophen (TYLENOL) 500 MG tablet Take 500 mg by mouth every 6 (six) hours as needed for moderate pain.    Yes [provider]  allopurinol (ZYLOPRIM) 300 MG tablet TAKE 1 TABLET BY MOUTH EVERY DAY Patient taking differently: Take 300 mg by mouth daily.  10/17/19  Yes Lequita Asal, MD  alum & mag hydroxide-simeth (MAALOX/MYLANTA) 200-200-20 MG/5ML suspension Take 30 mLs by mouth every 4 (four) hours as needed for indigestion or heartburn. 01/09/20  Yes Lorella Nimrod, MD  apixaban (ELIQUIS) 5 MG TABS tablet Take 1 tablet (  5 mg total) by mouth 2 (two) times daily. 01/09/20  Yes Lorella Nimrod, MD  aspirin EC 81 MG tablet Take 81 mg by mouth daily.   Yes [provider]  b complex vitamins tablet Take 1 tablet by mouth daily.   Yes [provider]  bisacodyl (DULCOLAX) 10 MG suppository Place 1 suppository (10 mg total) rectally daily as needed for moderate constipation. 01/09/20  Yes Lorella Nimrod, MD  Carbonyl Iron (IRON CHEWS PEDIATRIC PO) Take 5 mg by mouth daily.   Yes [provider]  Cholecalciferol (VITAMIN D3) 2000 units capsule Take  1,000 Units by mouth daily.    Yes [provider]  clotrimazole (LOTRIMIN) 1 % cream Apply topically 2 (two) times daily. 01/09/20  Yes Lorella Nimrod, MD  dextromethorphan 15 MG/5ML syrup Take 10 mLs by mouth 4 (four) times daily as needed for cough.   Yes [provider]  diclofenac Sodium (VOLTAREN) 1 % GEL Apply topically 4 (four) times daily.   Yes [provider]  DULoxetine (CYMBALTA) 60 MG capsule Take 60 mg by mouth daily.  09/01/19  Yes [provider]  guaiFENesin-dextromethorphan (ROBITUSSIN DM) 100-10 MG/5ML syrup Take 5 mLs by mouth every 4 (four) hours as needed for cough. 01/09/20  Yes Lorella Nimrod, MD  ipratropium-albuterol (DUONEB) 0.5-2.5 (3) MG/3ML SOLN Take 3 mLs by nebulization every 4 (four) hours as needed. 12/31/19  Yes Tyler Pita, MD  lidocaine (XYLOCAINE) 2 % solution Use as directed 15 mLs in the mouth or throat 3 (three) times daily as needed for mouth pain. 01/09/20  Yes Lorella Nimrod, MD  lidocaine-prilocaine (EMLA) cream Apply cream 1 hour before chemotherapy treatment and place small peive of saran wrap over cream to protect clothing 06/11/19  Yes Corcoran, Drue Second, MD  magic mouthwash SOLN Take 15 mLs by mouth 3 (three) times daily. 01/09/20  Yes Lorella Nimrod, MD  metoprolol tartrate (LOPRESSOR) 50 MG tablet Take 1 tablet (50 mg total) by mouth 2 (two) times daily. 01/09/20  Yes Lorella Nimrod, MD  Omega-3 Fatty Acids (FISH OIL PO) Take 1 tablet by mouth daily.   Yes [provider]  ondansetron (ZOFRAN ODT) 4 MG disintegrating tablet Take 1 tablet (4 mg total) by mouth every 8 (eight) hours as needed for nausea or vomiting. 06/11/19  Yes Corcoran, Drue Second, MD  pantoprazole (PROTONIX) 40 MG tablet Take 1 tablet (40 mg total) by mouth daily. 12/31/19 12/30/20 Yes Tyler Pita, MD  polyethylene glycol (MIRALAX / GLYCOLAX) 17 g packet Take 17 g by mouth 2 (two) times daily. 01/09/20  Yes Lorella Nimrod, MD  vitamin E 400  UNIT capsule Take 400 Units by mouth daily.    Yes [provider]  atorvastatin (LIPITOR) 80 MG tablet Take 80 mg by mouth every evening.  10/14/15 01/03/20  [provider]    Review of Systems  Constitutional: Positive for appetite change (decreased) and fatigue.  HENT: Positive for mouth sores (on lips) and rhinorrhea. Negative for congestion and sore throat.   Eyes: Negative.   Respiratory: Positive for cough and shortness of breath (wearing oxygen).   Cardiovascular: Negative for chest pain, palpitations and leg swelling.  Gastrointestinal: Negative for abdominal distention and abdominal pain.  Endocrine: Negative.   Genitourinary: Negative.   Musculoskeletal: Positive for arthralgias (left knee pain) and back pain (at times). Negative for neck pain.  Skin: Negative.   Allergic/Immunologic: Negative.   Neurological: Negative for dizziness and light-headedness.  Hematological: Negative for  adenopathy. Does not bruise/bleed easily.  Psychiatric/Behavioral: Positive for dysphoric mood. Negative for sleep disturbance. The patient is not nervous/anxious.    Vitals:   01/15/20 1003  BP: 134/72  Pulse: 82  Resp: 16  SpO2: 97%  Weight: 158 lb (71.7 kg)  Height: 5\' 2"  (1.575 m)   Wt Readings from Last 3 Encounters:  01/15/20 158 lb (71.7 kg)  01/14/20 158 lb (71.7 kg)  01/09/20 174 lb 2.6 oz (79 kg)   Lab Results  Component Value Date   CREATININE 0.77 01/14/2020   CREATININE 0.70 01/09/2020   CREATININE 0.71 01/07/2020    Physical Exam Vitals and nursing note reviewed.  Constitutional:      Appearance: She is well-developed.  HENT:     Head: Normocephalic and atraumatic.  Neck:     Vascular: No JVD.  Cardiovascular:     Rate and Rhythm: Normal rate and regular rhythm.  Pulmonary:     Effort: Pulmonary effort is normal. No respiratory distress.     Breath sounds: No wheezing or rales.  Musculoskeletal:     Cervical back: Normal range of motion and  neck supple.     Right lower leg: No tenderness. No edema.     Left lower leg: No tenderness. No edema.  Skin:    General: Skin is warm and dry.  Neurological:     General: No focal deficit present.     Mental Status: She is alert and oriented to person, place, and time.  Psychiatric:        Mood and Affect: Mood normal.        Behavior: Behavior normal.     Assessment & Plan:  1: Chronic heart failure with preserved ejection fraction with structural changes- - NYHA class II - euvolemic today - not being weighed so an order was written for her to be weighed daily and to call for an overnight weight gain of >2 pounds or a weekly weight gain of > 5 pounds - not adding salt to her food; reviewed the importance of closely following a low sodium diet once she returns home - saw cardiology Nehemiah Massed) during recent admission - to start receiving PT at the facility - BNP 01/03/20 was 477.0  2: HTN- - BP looks good today - currently seeing PCP at the facility but normally sees PCP in [facility] she [facility] - [facility] 01/14/20 reviewed and showed sodium 131, potassium 3.4, creatinine 0.77 and GFR >60  3: COPD- - saw pulmonology Ronalee Belts) 12/31/19 - wearing oxygen @ 2L  4: Lung cancer-  - saw oncology Mike Gip) 01/14/20  5: Atrial fibrillation- - now on apixaban - discussed that she may need cardiology f/u regarding this.    Patient did not bring her medications nor a list. Each medication was verbally reviewed with the patient and she was encouraged to bring the bottles to every visit to confirm accuracy of list.  Return in 2 months or sooner for any questions/problems before then.

## 2020-01-14 NOTE — Patient Instructions (Signed)
Zoledronic Acid injection (Hypercalcemia, Oncology) What is this medicine? ZOLEDRONIC ACID (ZOE le dron ik AS id) lowers the amount of calcium loss from bone. It is used to treat too much calcium in your blood from cancer. It is also used to prevent complications of cancer that has spread to the bone. This medicine may be used for other purposes; ask your health care provider or pharmacist if you have questions. COMMON BRAND NAME(S): Zometa What should I tell my health care provider before I take this medicine? They need to know if you have any of these conditions:  aspirin-sensitive asthma  cancer, especially if you are receiving medicines used to treat cancer  dental disease or wear dentures  infection  kidney disease  receiving corticosteroids like dexamethasone or prednisone  an unusual or allergic reaction to zoledronic acid, other medicines, foods, dyes, or preservatives  pregnant or trying to get pregnant  breast-feeding How should I use this medicine? This medicine is for infusion into a vein. It is given by a health care professional in a hospital or clinic setting. Talk to your pediatrician regarding the use of this medicine in children. Special care may be needed. Overdosage: If you think you have taken too much of this medicine contact a poison control center or emergency room at once. NOTE: This medicine is only for you. Do not share this medicine with others. What if I miss a dose? It is important not to miss your dose. Call your doctor or health care professional if you are unable to keep an appointment. What may interact with this medicine?  certain antibiotics given by injection  NSAIDs, medicines for pain and inflammation, like ibuprofen or naproxen  some diuretics like bumetanide, furosemide  teriparatide  thalidomide This list may not describe all possible interactions. Give your health care provider a list of all the medicines, herbs, non-prescription  drugs, or dietary supplements you use. Also tell them if you smoke, drink alcohol, or use illegal drugs. Some items may interact with your medicine. What should I watch for while using this medicine? Visit your doctor or health care professional for regular checkups. It may be some time before you see the benefit from this medicine. Do not stop taking your medicine unless your doctor tells you to. Your doctor may order blood tests or other tests to see how you are doing. Women should inform their doctor if they wish to become pregnant or think they might be pregnant. There is a potential for serious side effects to an unborn child. Talk to your health care professional or pharmacist for more information. You should make sure that you get enough calcium and vitamin D while you are taking this medicine. Discuss the foods you eat and the vitamins you take with your health care professional. Some people who take this medicine have severe bone, joint, and/or muscle pain. This medicine may also increase your risk for jaw problems or a broken thigh bone. Tell your doctor right away if you have severe pain in your jaw, bones, joints, or muscles. Tell your doctor if you have any pain that does not go away or that gets worse. Tell your dentist and dental surgeon that you are taking this medicine. You should not have major dental surgery while on this medicine. See your dentist to have a dental exam and fix any dental problems before starting this medicine. Take good care of your teeth while on this medicine. Make sure you see your dentist for regular follow-up   appointments. What side effects may I notice from receiving this medicine? Side effects that you should report to your doctor or health care professional as soon as possible:  allergic reactions like skin rash, itching or hives, swelling of the face, lips, or tongue  anxiety, confusion, or depression  breathing problems  changes in vision  eye  pain  feeling faint or lightheaded, falls  jaw pain, especially after dental work  mouth sores  muscle cramps, stiffness, or weakness  redness, blistering, peeling or loosening of the skin, including inside the mouth  trouble passing urine or change in the amount of urine Side effects that usually do not require medical attention (report to your doctor or health care professional if they continue or are bothersome):  bone, joint, or muscle pain  constipation  diarrhea  fever  hair loss  irritation at site where injected  loss of appetite  nausea, vomiting  stomach upset  trouble sleeping  trouble swallowing  weak or tired This list may not describe all possible side effects. Call your doctor for medical advice about side effects. You may report side effects to FDA at 1-800-FDA-1088. Where should I keep my medicine? This drug is given in a hospital or clinic and will not be stored at home. NOTE: This sheet is a summary. It may not cover all possible information. If you have questions about this medicine, talk to your doctor, pharmacist, or health care provider.  2020 [employer] (2013-12-13 14:19:39)  

## 2020-01-15 ENCOUNTER — Encounter: Payer: Self-pay | Admitting: Family

## 2020-01-15 ENCOUNTER — Ambulatory Visit: Payer: Medicare HMO | Attending: Family | Admitting: Family

## 2020-01-15 VITALS — BP 134/72 | HR 82 | Resp 16 | Ht 62.0 in | Wt 158.0 lb

## 2020-01-15 DIAGNOSIS — J449 Chronic obstructive pulmonary disease, unspecified: Secondary | ICD-10-CM | POA: Insufficient documentation

## 2020-01-15 DIAGNOSIS — Z85118 Personal history of other malignant neoplasm of bronchus and lung: Secondary | ICD-10-CM | POA: Diagnosis not present

## 2020-01-15 DIAGNOSIS — M1990 Unspecified osteoarthritis, unspecified site: Secondary | ICD-10-CM | POA: Insufficient documentation

## 2020-01-15 DIAGNOSIS — Z87891 Personal history of nicotine dependence: Secondary | ICD-10-CM | POA: Insufficient documentation

## 2020-01-15 DIAGNOSIS — M25562 Pain in left knee: Secondary | ICD-10-CM | POA: Diagnosis not present

## 2020-01-15 DIAGNOSIS — R0602 Shortness of breath: Secondary | ICD-10-CM | POA: Insufficient documentation

## 2020-01-15 DIAGNOSIS — Z79899 Other long term (current) drug therapy: Secondary | ICD-10-CM | POA: Insufficient documentation

## 2020-01-15 DIAGNOSIS — K219 Gastro-esophageal reflux disease without esophagitis: Secondary | ICD-10-CM | POA: Insufficient documentation

## 2020-01-15 DIAGNOSIS — H9190 Unspecified hearing loss, unspecified ear: Secondary | ICD-10-CM | POA: Insufficient documentation

## 2020-01-15 DIAGNOSIS — Z7982 Long term (current) use of aspirin: Secondary | ICD-10-CM | POA: Diagnosis not present

## 2020-01-15 DIAGNOSIS — I480 Paroxysmal atrial fibrillation: Secondary | ICD-10-CM

## 2020-01-15 DIAGNOSIS — R918 Other nonspecific abnormal finding of lung field: Secondary | ICD-10-CM

## 2020-01-15 DIAGNOSIS — Z801 Family history of malignant neoplasm of trachea, bronchus and lung: Secondary | ICD-10-CM | POA: Diagnosis not present

## 2020-01-15 DIAGNOSIS — Z7901 Long term (current) use of anticoagulants: Secondary | ICD-10-CM | POA: Diagnosis not present

## 2020-01-15 DIAGNOSIS — I110 Hypertensive heart disease with heart failure: Secondary | ICD-10-CM | POA: Diagnosis not present

## 2020-01-15 DIAGNOSIS — I5032 Chronic diastolic (congestive) heart failure: Secondary | ICD-10-CM | POA: Insufficient documentation

## 2020-01-15 DIAGNOSIS — Z951 Presence of aortocoronary bypass graft: Secondary | ICD-10-CM | POA: Insufficient documentation

## 2020-01-15 DIAGNOSIS — I10 Essential (primary) hypertension: Secondary | ICD-10-CM

## 2020-01-15 DIAGNOSIS — I4891 Unspecified atrial fibrillation: Secondary | ICD-10-CM | POA: Diagnosis not present

## 2020-01-15 NOTE — Patient Instructions (Addendum)
Begin weighing daily and call for an overnight weight gain of > 2 pounds or a weekly weight gain of >5 pounds. 

## 2020-01-19 ENCOUNTER — Other Ambulatory Visit: Payer: Medicare HMO

## 2020-01-19 ENCOUNTER — Ambulatory Visit: Payer: Medicare HMO | Admitting: Hematology and Oncology

## 2020-01-19 ENCOUNTER — Ambulatory Visit: Payer: Medicare HMO

## 2020-01-20 NOTE — Progress Notes (Signed)
[facility]  [address] [place], [place] [address] Phone: [phone]  Fax: [phone]   Clinic Day:  01/21/2020  Referring physician: Francesca Oman, DO  Chief Complaint: Toni Griffith is a 79 y.o. 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  HPI: The patient was last seen in the medical oncology clinic on 01/14/2020. At that time, she felt sleepy and tired all the time. Weight was down 5 lbs.  She was on 2 liters/min oxygen.  She was at [facility] in [place], [place]. Physical therapy was helping her gain strength in the upper and lower extremities.   Hematocrit was 30.5, hemoglobin 9.9, MCV 95.0, platelets 210,000, WBC 9,800. Sodium was 131. Calcium was 10.5.  Magnesium was 1.5. She received 2 gm of magnesium.  She received Zometa for malignancy associated hypercalcemia.  The patient established care with Darylene Price, FNP, cardiology, on 01/15/2020 for shortness of breath.  Exam was unremarkable. Advised patient to begin weighing herself daily. Will follow up in 2 months.  During the interim, she has been okay. She is notes on and off pain above her left breast.  She denies any trauma, injury, or skin changes to the area. She has mild knee pain.  She has a runny nose and cough. Shortness of breath has improved.  She denies headaches, change in vision, sore throat, urinary or abdominal symptoms.  She has not been eating much because of another sore on her lower lip. She only drinks occasional small cups of water. She has potassium at home, but not at the rehab facility.  She is still living at a care facility; she does not like it there.  Her family is trying to bring her home this week. She remains on 2 liters/min of oxygen. She has been doing physical therapy and occupational therapy.   She is dehydrated in clinic.  The patient is  agreeable to IV fluids today and plans to return tomorrow for possible treatment.   Past Medical History:  Diagnosis Date  . Arthritis   . Cancer [facility])    lymphoma-right hip  . CHF (congestive heart failure) ([place])   . COPD (chronic obstructive pulmonary disease) ([place])   . Deaf   . GERD (gastroesophageal reflux disease)   . Hypertension    NOT ON MED AT PRESENT  . Squamous cell lung cancer [facility])     Past Surgical History:  Procedure Laterality Date  . CARDIAC SURGERY  1993  . CORONARY ARTERY BYPASS GRAFT     DOUBLE  . CYST REMOVAL HAND Right   . ENDOBRONCHIAL ULTRASOUND N/A 05/30/2018   Procedure: ENDOBRONCHIAL ULTRASOUND;  Surgeon: Tyler Pita, MD;  Location: [facility] ORS;  Service: Cardiopulmonary;  Laterality: N/A;  . PERIPHERAL VASCULAR CATHETERIZATION N/A 04/06/2016   Procedure: Glori Luis Cath Insertion;  Surgeon: Algernon Huxley, MD;  Location: [facility] LAB;  Service: Cardiovascular;  Laterality: N/A;    Family History  Problem Relation Age of Onset  . Lung cancer Mother   . Lung cancer Father   . Diabetes Maternal Uncle   . Myasthenia gravis Maternal Grandmother   . Leukemia Maternal Grandfather     Social History:  reports that she quit smoking about 4 years ago. Her smoking use included cigarettes. She has a 80.00 pack-year smoking history. She has never used smokeless tobacco. She reports current  alcohol use. She reports that she does not use drugs.  She smoked 1 1/2 packs per day for 20 years (30 pack year smoking history) until 10/2015. She is hearing impaired (deaf). She lives in [place] with her boyfriend. Her daughter lives in [place]. Her daughter, [name]'s contact number is [phone].She has 2 cats named Pepper and Baby. The patient is accompanied by her daughter on the phone and an interpreter today.  Allergies: No Known Allergies  Current Medications: Current Outpatient Medications  Medication Sig Dispense Refill  . acetaminophen (TYLENOL) 500  MG tablet Take 500 mg by mouth every 6 (six) hours as needed for moderate pain.     Marland Kitchen allopurinol (ZYLOPRIM) 300 MG tablet TAKE 1 TABLET BY MOUTH EVERY DAY (Patient taking differently: Take 300 mg by mouth daily. ) 90 tablet 0  . alum & mag hydroxide-simeth (MAALOX/MYLANTA) 200-200-20 MG/5ML suspension Take 30 mLs by mouth every 4 (four) hours as needed for indigestion or heartburn. 355 mL 0  . apixaban (ELIQUIS) 5 MG TABS tablet Take 1 tablet (5 mg total) by mouth 2 (two) times daily. 60 tablet   . aspirin EC 81 MG tablet Take 81 mg by mouth daily.    Marland Kitchen atorvastatin (LIPITOR) 80 MG tablet Take 80 mg by mouth every evening.     Marland Kitchen b complex vitamins tablet Take 1 tablet by mouth daily.    . bisacodyl (DULCOLAX) 10 MG suppository Place 1 suppository (10 mg total) rectally daily as needed for moderate constipation. 12 suppository 0  . Carbonyl Iron (IRON CHEWS PEDIATRIC PO) Take 5 mg by mouth daily.    . Cholecalciferol (VITAMIN D3) 2000 units capsule Take 1,000 Units by mouth daily.     . clotrimazole (LOTRIMIN) 1 % cream Apply topically 2 (two) times daily. 30 g 0  . dextromethorphan 15 MG/5ML syrup Take 10 mLs by mouth 4 (four) times daily as needed for cough.    . diclofenac Sodium (VOLTAREN) 1 % GEL Apply topically 4 (four) times daily.    . DULoxetine (CYMBALTA) 60 MG capsule Take 60 mg by mouth daily.     Marland Kitchen guaiFENesin-dextromethorphan (ROBITUSSIN DM) 100-10 MG/5ML syrup Take 5 mLs by mouth every 4 (four) hours as needed for cough. 118 mL 0  . ipratropium-albuterol (DUONEB) 0.5-2.5 (3) MG/3ML SOLN Take 3 mLs by nebulization every 4 (four) hours as needed. 360 mL 3  . lidocaine (XYLOCAINE) 2 % solution Use as directed 15 mLs in the mouth or throat 3 (three) times daily as needed for mouth pain.  0  . lidocaine-prilocaine (EMLA) cream Apply cream 1 hour before chemotherapy treatment and place small peive of saran wrap over cream to protect clothing 30 g 3  . magic mouthwash SOLN Take 15 mLs by  mouth 3 (three) times daily.  0  . metoprolol tartrate (LOPRESSOR) 50 MG tablet Take 1 tablet (50 mg total) by mouth 2 (two) times daily.    . Omega-3 Fatty Acids (FISH OIL PO) Take 1 tablet by mouth daily.    . ondansetron (ZOFRAN ODT) 4 MG disintegrating tablet Take 1 tablet (4 mg total) by mouth every 8 (eight) hours as needed for nausea or vomiting. 30 tablet 1  . pantoprazole (PROTONIX) 40 MG tablet Take 1 tablet (40 mg total) by mouth daily. 30 tablet 2  . polyethylene glycol (MIRALAX / GLYCOLAX) 17 g packet Take 17 g by mouth 2 (two) times daily. 14 each 0  . vitamin E 400 UNIT capsule Take  400 Units by mouth daily.      No current facility-administered medications for this visit.   Facility-Administered Medications Ordered in Other Visits  Medication Dose Route Frequency Provider Last Rate Last Admin  . heparin lock flush 100 unit/mL  500 Units Intravenous Once Shadrack Brummitt C, MD      . sodium chloride flush (NS) 0.9 % injection 10 mL  10 mL Intravenous PRN Nolon Stalls C, MD   10 mL at 04/05/17 1034  . sodium chloride flush (NS) 0.9 % injection 10 mL  10 mL Intravenous PRN Nolon Stalls C, MD   10 mL at 02/27/19 1033    Review of Systems  Constitutional: Positive for weight loss (1 lb). Negative for chills, diaphoresis (resolved), fever and malaise/fatigue.       Doing okay.  HENT: Positive for hearing loss (deaf). Negative for congestion, ear discharge, ear pain, nosebleeds, sinus pain, sore throat and tinnitus.        Sore on lower inner lip. Runny nose due to oxygen  Eyes: Negative for blurred vision and double vision.  Respiratory: Positive for cough. Negative for hemoptysis, sputum production and shortness of breath (improved).        On 2 liters/min of oxygen.  Cardiovascular: Negative for chest pain, palpitations and leg swelling.  Gastrointestinal: Negative for abdominal pain, blood in stool, constipation, diarrhea, heartburn, melena, nausea (on zofran as  needed) and vomiting.       Appetite has decreased due to sore on lip. Fluid intake has decreased.  Genitourinary: Negative for dysuria, frequency, hematuria and urgency.  Musculoskeletal: Positive for joint pain (chronic; left knee, mild). Negative for back pain (lower back), falls and myalgias.  Skin: Negative for itching and rash.       Intermittent pain above left breast.  Neurological: Positive for sensory change (baseline neuropathy unchanged; left hand 2nd and 3rd digits. neuropathy in sole of feet unchanged). Negative for dizziness, weakness and headaches.  Endo/Heme/Allergies: Negative for environmental allergies and polydipsia. Does not bruise/bleed easily.       Hot flashes at times  Psychiatric/Behavioral: Positive for depression. Negative for memory loss. The patient is not nervous/anxious and does not have insomnia.   All other systems reviewed and are negative.  Performance status (ECOG):  2-3  Vitals Blood pressure (!) 117/103, pulse 85, temperature (!) 96.7 F (35.9 C), temperature source Tympanic, resp. rate (!) 22, weight 157 lb (71.2 kg), SpO2 99 %.   Physical Exam Vitals and nursing note reviewed.  Constitutional:      General: She is not in acute distress.    Appearance: Normal appearance. She is well-developed. She is not diaphoretic.     Interventions: Face mask in place.     Comments: Patient is sitting comfortably in the exam room in no acute distress.  She is examined in wheelchair.  HENT:     Head: Normocephalic and atraumatic.     Mouth/Throat:     Mouth: Mucous membranes are dry. No oral lesions.     Pharynx: No oropharyngeal exudate.     Comments: Linear trauma lower lip corresponding to upper dentures.  No blisters. Eyes:     General: No scleral icterus.       Right eye: No discharge.        Left eye: No discharge.     Conjunctiva/sclera: Conjunctivae normal.     Pupils: Pupils are equal, round, and reactive to light.     Comments: Hazel eyes.    Neck:  Vascular: No JVD.  Cardiovascular:     Rate and Rhythm: Regular rhythm.     Heart sounds: Normal heart sounds. No murmur heard.  No friction rub. No gallop.   Pulmonary:     Effort: Pulmonary effort is normal.     Breath sounds: No wheezing or rhonchi.     Comments: Decreased breath sounds left mid lung field (chronic). Abdominal:     General: Bowel sounds are normal. There is no distension.     Palpations: Abdomen is soft. There is no mass.     Tenderness: There is no abdominal tenderness. There is no guarding or rebound.  Musculoskeletal:        General: No swelling or tenderness. Normal range of motion.     Cervical back: Normal range of motion and neck supple.     Left knee: No swelling.  Lymphadenopathy:     Head:     Right side of head: No preauricular, posterior auricular or occipital adenopathy.     Left side of head: No preauricular, posterior auricular or occipital adenopathy.     Cervical: No cervical adenopathy.     Upper Body:     Right upper body: No supraclavicular or axillary adenopathy.     Left upper body: No supraclavicular or axillary adenopathy.     Lower Body: No right inguinal adenopathy. No left inguinal adenopathy.  Skin:    General: Skin is warm and dry.     Coloration: Skin is not pale.     Findings: No bruising, erythema, lesion or rash.  Neurological:     Mental Status: She is alert and oriented to person, place, and time.  Psychiatric:        Behavior: Behavior normal.        Thought Content: Thought content normal.        Judgment: Judgment normal.    Imaging studies: 03/03/2016:  PET scanrevealed a hypermetabolic left lower lobe mass, bilateral pleural and pulmonary parenchymal nodular metastasis, a large pleural lesion invading the anterior left chest wall. There was activity in the superior left ocular orbit. There was a large lesion centered in the right iliac wing with soft tissue extension. There were hypermetabolic right  external iliac lymph nodes 01/29/2017:  PET scanrevealed interval progression of hypermetabolic nodules in the right upper (13 mm compared to 9 mm; SUV 10) and left lower lobes (26 mm compared to 22 mm; SUV 7.5). There was new hypermetabolic focus of activity along the anterior left pleura although no underlying pleural or lung mass was evident. 06/06/2019:  Chest, abdomen, and pelvis CTrevealed an interval mass-like opacity in the superior aspect of the left parahilar lungwith an interval more prominent mass-like component involving the pleura laterally at the level with slightly more prominent extension into the left hila region, suspicious for recurrent or progressive lung cancer. There was mediastinal and bilateral hilar adenopathyc/wmetastatic adenopathy or involvement with the patient's B-cell lymphoma. There was no evidence of metastatic disease or lymphoma involving the abdomen or pelvis. There was significantly improved ground-glass opacities in the right lung, minimal left pleural fluid(improved), andastable large hiatal hernia.There was stable enlarged thyroid gland containing heterogeneous nodules with substernal extension on the left.There was stable to slightly decreased size of the previously demonstrated right adrenal nodule and no significant change in a small, poorly visualized left adrenal nodule. 06/17/2019:  PET scanrevealed a known left parahilar lung mass markedly hypermetabolic with associated hypermetabolic lymphadenopathy in both hilar regions, the mediastinum, andthe right thoracic  inlet. There was hypermetabolism in the right submandibular gland, indeterminate. There was no hypermetabolic metastatic disease evident in the abdomen or pelvis. There was a stable bilateral adrenal adenomas since 01/29/2018. There were tiny left pleural effusion and a large hiatal hernia.There was a segment of transverse colon extendingup into the hiatal hernia without obstructive  features.  12/02/2019:  Chest, abdomen and pelvis CT revealed interval resolution of mediastinal and hilar adenopathy c/w response to therapy. 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 There was increased moderate-sized dependent left pleural effusion with increased left lower lobe compressive atelectasis. There was a new 6 mm nodule in the superior segment of the right lower lobe, nonspecific, but potentially a metastasis. Attention on follow-up was recommended.  There was no evidence of metastatic disease in the abdomen or pelvis.  There was a moderate-sized hiatal hernia and bilateral adrenal adenomas.    Infusion on 01/21/2020  Component Date Value Ref Range Status  . Magnesium 01/21/2020 1.8  1.7 - 2.4 mg/dL Final   Performed at [facility], [address], [place], [place] [address]  . Sodium 01/21/2020 135  135 - 145 mmol/L Final  . Potassium 01/21/2020 3.3* 3.5 - 5.1 mmol/L Final  . Chloride 01/21/2020 102  98 - 111 mmol/L Final  . CO2 01/21/2020 25  22 - 32 mmol/L Final  . Glucose, Bld 01/21/2020 129* 70 - 99 mg/dL Final   Glucose reference range applies only to samples taken after fasting for at least 8 hours.  . BUN 01/21/2020 14  8 - 23 mg/dL Final  . Creatinine, Ser 01/21/2020 1.19* 0.44 - 1.00 mg/dL Final  . Calcium 01/21/2020 8.1* 8.9 - 10.3 mg/dL Final  . Total Protein 01/21/2020 6.1* 6.5 - 8.1 g/dL Final  . Albumin 01/21/2020 2.1* 3.5 - 5.0 g/dL Final  . AST 01/21/2020 35  15 - 41 U/L Final  . ALT 01/21/2020 25  0 - 44 U/L Final  . Alkaline Phosphatase 01/21/2020 101  38 - 126 U/L Final  . Total Bilirubin 01/21/2020 0.5  0.3 - 1.2 mg/dL Final  . GFR calc non Af Amer 01/21/2020 44* >60 mL/min Final  . GFR calc Af Amer 01/21/2020 51* >60 mL/min Final  . Anion gap 01/21/2020 8  5 - 15 Final   Performed at [facility], [address] [place], [place] [address]  . WBC 01/21/2020 9.3  4.0 - 10.5 K/uL Final  . RBC 01/21/2020 3.27* 3.87 - 5.11 MIL/uL Final  . Hemoglobin 01/21/2020 10.0* 12.0 - 15.0 g/dL Final  . HCT 01/21/2020 30.9* 36 - 46 % Final  . MCV 01/21/2020 94.5  80.0 - 100.0 fL Final  . MCH 01/21/2020 30.6  26.0 - 34.0 pg Final  . MCHC 01/21/2020 32.4  30.0 - 36.0 g/dL Final  . RDW 01/21/2020 18.1* 11.5 - 15.5 % Final  . Platelets 01/21/2020 431* 150 - 400 K/uL Final  . nRBC 01/21/2020 0.0  0.0 - 0.2 % Final  . Neutrophils Relative % 01/21/2020 76  % Final  . Neutro Abs 01/21/2020 7.0  1.7 - 7.7 K/uL Final  . Lymphocytes Relative 01/21/2020 10  % Final  . Lymphs Abs 01/21/2020 0.9  0.7 - 4.0 K/uL Final  . Monocytes Relative 01/21/2020 13  % Final  . Monocytes Absolute 01/21/2020 1.2* 0 - 1 K/uL Final  . Eosinophils Relative 01/21/2020 0  % Final  . Eosinophils Absolute 01/21/2020 0.0  0 - 0 K/uL Final  . Basophils Relative 01/21/2020 0  % Final  . Basophils Absolute 01/21/2020 0.0  0 - 0 K/uL Final  . Immature Granulocytes 01/21/2020 1  % Final  . Abs Immature Granulocytes 01/21/2020 0.12* 0.00 - 0.07 K/uL Final   Performed at [facility], [address], [place], [place] [address]    Assessment:  Karalee Hauter is a 79 y.o. female withstage IVB B-cell lymphoma, unclassifiable, with features intermediate between diffuse large B-cell lymphoma and classical Hodgkin's lymphoma ("gray zone" lymphoma). She underwent right iliac wing bone/bone marrow biopsy on 03/08/2016.  PET scanon 03/03/2016 revealed a hypermetabolic left lower lobe mass, bilateral pleural and pulmonary parenchymal nodular metastasis, a large pleural lesion invading the anterior left chest wall. There was activity in the superior left ocular orbit. There was a large lesion centered in the right iliac wing with soft tissue extension. There were hypermetabolic right external iliac lymph nodes  [place] 04/04/2016 revealed  an EF of 55-60%. Hepatitis B and C testing was negative on 03/30/2016. G6PD assay was normal. She declined LP with MTX prophylaxis.  She received 6 cycles of mini-RCHOP(04/07/2016 - 07/21/2016). She has had some transient fingertip numbness.  She received radiationto the right iliac wing, from 10/11/2016 - 11/01/2016. She received 3000 cGy over 3 weeks.  She has squamous cell lung cancer. CT guided left lower lobe nodule biopsyon 02/19/2017 confirmed squamous cell carcinoma. She underwent SBRT08/27/2018 - 04/09/2017. She received 5000 cGy in 5 fractions to the left lower lobe lesion. She underwent SBRTto a RUL lesion from 05/23/2017 - 06/18/2017.  CT guided left upper lobe nodule biopsyon 02/20/2018 revealedsquamous cell lung cancer. She received SBRTfrom 04/09/2018 - 04/15/2018. She received 6000 cGy in 5 fractions.   Foundation Oneon the lung biopsy on 03/15/2018 revealed MS-stable, tumor mutational burden 5 Muts/Mb, AKT2 amplification, CCNE1 amplification, CREBBP R1446L, KDR amplification, KIT amplification, PDGFRA amplification, PTEN loss exons 2-4, and VO16 splice site [id number]>G. Marland Kitchen There were no alterations in EGFR, KRAS, RET, ALK, MET, ERBB2, BRAF, ROS1. PDL-1IHC analysis revealed a TPS score of 50%.  Bronchoscopyon 05/30/2018 revealed no endobronchial lesions. The 2 cm pre-carinal lymph node was biopsied. Pathology revealed metastatic non-small cell carcinoma, favor squamous cell carcinoma.  CT guided right paraspinal lesion biopsyon 10/16/2017 was compatible with involvement by the patient's previously diagnosed lymphoma. The case was referred to [facility] hematopathology. The neoplastic cells were largely CD30 negative. Repeat CD30 immunohistochemistry faintly stained >10% of the large atypical cells. CD20 stained the scattered large atypical cells and few small cells.   She received 3 cycles of brentuximab vedotin(11/23/2017 - 01/11/2018).Her lymphoma  progressed.  Shereceived14cycles of pembrolizumab(06/14/2018 -05/21/2019).   She received 4 cycles ofpembolizumab, carboplatin and Abraxane (08/29/2019 - 11/05/2019). She experienced tinnitus after her first day of chemotherapy. Tinnitus was minor with cycle #2.  She received day 1 of cycle #1 gemcitabine (01/01/2020).  She declined further gemcitabine.  Shereceived4000cGyto the left hilar regionfrom12/14/2020 -08/15/2019.  Chest, abdomen and pelvis CT on 12/02/2019 revealed interval resolution of mediastinal and hilar adenopathy c/w response to therapy. 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 There was increased moderate-sized dependent left pleural effusion with increased left lower lobe compressive atelectasis. There was a new 6 mm nodule in the superior segment of the right lower lobe, nonspecific, but potentially a metastasis. Attention on follow-up was recommended.  There was no evidence of metastatic disease in the abdomen or pelvis.  There  was a moderate-sized hiatal hernia and bilateral adrenal adenomas.   Ultrasound guided left thoracentesis on 12/05/2019 revealed non-small cell carcinoma.   She hasiron deficiency anemia. Ferritinhas been followed: 14 on 05/06/2018,23 on 07/26/2018, and 59 on 11/01/2018. She denies any bleeding.  She has a grade I neuropathy. B12 and folate were normal on 12/06/2017. She has severe bilateral knee osteoarthritis. She has received steroid injections and Monovisc (hyaluronate derivatives) without improvement.   She was admitted to [facility] from 12/09/2019-12/11/2019 with chills, diaphoresis, tachycardia, and tachypnea for r/o sepsis. She was treated with broad spectrum antibiotics (vancomycin and Cefepime). Cultures remained negative.  She was discharged on Augmentin.  She was switched to Levaquin on  12/25/2019.  She was admitted to [facility] from 01/03/2020 - 01/09/2020 with swollen lips and mouth burning felt secondary to medication (? gemcitabine), chronic respiratory failure, pneumonia, and new onset A. fib with RVR.  She was treated with Cefepime then switched to Cefdinir, Eliquis, and metoprolol.  She was discharged to rehabilitation.  She has malignancy associated hypercalcemia.  She received Zometa on 01/14/2020.  She has atrial fibrillation and is on Eliquis.  She has NYHA class II chronic heart failure.  Echo on 01/04/2020 revealed an EF of 60-65%.  She is deafand requires an interpreter.  Symptomatically, she is dehydrated.  She remains in rehabilitation.  She wishes to pursue treatment. Corrected calcium is 9.7 (improved).  Plan: 1.   Labs: CBC with diff, CMP, Mg. 2.Gray zone lymphoma Clinically, her lymphoma appears controlled. Exam reveals no adenopathy or hepatosplenomegaly. She has received 6 cycles of mini-RCOP, 3 cycles ofbrentuximab vedotin, and 16cycles of pembrolizumab. PET scan on 06/17/2019 revealed progressive disease in the chest felt secondary toprogressive lung cancer. She is s/p6additional cycle of maintenance pembrolizumab (last 01/01/2020). Discuss plan to continue pembrolizumab after first cycle of navelbine.  3. 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 Patient is s/p SBRT to left upper lobe and left lower lobe nodules. Patient is s/p 15cyclesof pembrolizumab. Patient is s/p palliative radiation to the right hilar region (completed 08/15/2019). She is s/p4cycles of carboplatin and Abraxane (08/29/2019 - 11/05/2019). Chest, abdomen and pelvis CT scan on 12/02/2019 revealed a new moderate left-sided effusion.  Thoracentesis on 12/05/2019 confirmed non-small cell lung cancer.  No clinical trial available at [facility].  She received day 1  of cycle #1 gemcitabine.  Course was complicated  Discuss plans for navelbine (2 weeks on and 1 week off).   Potential side effects reviewed.  Discuss plan for treatment tomorrow. 4.Recurrent left sided pleural effusion  Patient is s/p thoracentesis on 12/05/2019 which confirmed non-small cell lung cancer.  Plan for Pleur-X catheter if fluid reaccumulates.  Stent would not be benificial as obstruction is too distal.  Patient is followed by Dr Patsey Berthold. 5.   Post obstructive pneumonia  Clinically, patient clinically remains stable.  She has completed several rounds of antibiotics.    Continue to monitor. 6.   Malignancy associated hypercalcemia  Calcium 10.5 (corrected 11.86) last week.  Calcium 8.1 (corrected 9.714) today.  She received Zometa last week.  Suspect patient will likely require treatment monthly.  Continue to monitor. 7.   Chemotherapy-induced neuropathy Neuropathy is stable. 8. Cancer related pain  Pain notes some left chest sided discomfort.  She takes Tylenol for bilateral knee pain.   9.   Dehydration  Patient clinically dehydrated today with dry oral mucosa.  Creatinine has increased (0.77  to 1.19).  She notes limited fluid intake.  Check orthostatics , give 500 cc NS then recheck orthostatics. 10.  Reactive airway disease  Patient audibly wheezing in the infusion center after her appointment and prior to receiving fluids.   Exam in patient room earlier without wheezing.  Wheezing appears positional (worse in recumbent position).  After sitting upright, wheezing improves, but audible throughout (right > left).     O2 sats 96% with RR 22.  Nebulized treatment in [facility] is unavailable.  Patient has a neubilizer at home, but not at the care facility.  Discuss transfer to ER for evaluation and treatment.  Daughter to discuss available options at care facility. 11.   Code status  Code status is FULL. 12.   RTC tomorrow for brief MD assessment,  labs (CMP), +/- IVF and +/- navelbine.  I discussed the assessment and treatment plan with the patient.  The patient was provided an opportunity to ask questions and all were answered.  The patient agreed with the plan and demonstrated an understanding of the instructions.  The patient was advised to call back if the symptoms worsen or if the condition fails to improve as anticipated.  I provided 22 minutes of face-to-face time during this this encounter and > 50% was spent counseling as documented under my assessment and plan.  An additional 15 minutes were spent reviewing her chart ([facility] [place]) including notes, labs, and imaging studies as well as reassessing the patient in the infusion center, contacting [facility] and the [facility] ER.  I spoke to the patient's daughter on her arrival.    Lequita Asal, MD, PhD    01/21/2020, 2:11 PM  I, De Burrs, am acting as scribe for [employer]. Mike Gip, MD, PhD.  I, Shailynn Fong C. Mike Gip, MD, have reviewed the above documentation for accuracy and completeness, and I agree with the above.

## 2020-01-20 NOTE — Telephone Encounter (Signed)
Spoke to pt's daughter, Toni Griffith ([place]).  Toni Griffith stated that pt was discharged on 2L.  Toni Griffith is providing pt with O2. Per Toni Griffith, pt was only provided with tanks.  Order has been placed for night time oxygen.  Toni Griffith is aware and voiced her understanding. Nothing further is needed.

## 2020-01-21 ENCOUNTER — Other Ambulatory Visit: Payer: Self-pay

## 2020-01-21 ENCOUNTER — Inpatient Hospital Stay: Payer: Medicare HMO

## 2020-01-21 ENCOUNTER — Encounter: Payer: Self-pay | Admitting: Hematology and Oncology

## 2020-01-21 ENCOUNTER — Inpatient Hospital Stay: Payer: Medicare HMO | Admitting: Hematology and Oncology

## 2020-01-21 VITALS — BP 117/103 | HR 85 | Temp 96.7°F | Resp 22 | Wt 157.0 lb

## 2020-01-21 DIAGNOSIS — Z7189 Other specified counseling: Secondary | ICD-10-CM

## 2020-01-21 DIAGNOSIS — Z5111 Encounter for antineoplastic chemotherapy: Secondary | ICD-10-CM | POA: Diagnosis not present

## 2020-01-21 DIAGNOSIS — C8518 Unspecified B-cell lymphoma, lymph nodes of multiple sites: Secondary | ICD-10-CM

## 2020-01-21 DIAGNOSIS — E538 Deficiency of other specified B group vitamins: Secondary | ICD-10-CM

## 2020-01-21 DIAGNOSIS — E860 Dehydration: Secondary | ICD-10-CM

## 2020-01-21 DIAGNOSIS — C7951 Secondary malignant neoplasm of bone: Secondary | ICD-10-CM | POA: Diagnosis not present

## 2020-01-21 DIAGNOSIS — C3492 Malignant neoplasm of unspecified part of left bronchus or lung: Secondary | ICD-10-CM

## 2020-01-21 LAB — CBC WITH DIFFERENTIAL/PLATELET
Abs Immature Granulocytes: 0.12 10*3/uL — ABNORMAL HIGH (ref 0.00–0.07)
Basophils Absolute: 0 10*3/uL (ref 0.0–0.1)
Basophils Relative: 0 %
Eosinophils Absolute: 0 10*3/uL (ref 0.0–0.5)
Eosinophils Relative: 0 %
HCT: 30.9 % — ABNORMAL LOW (ref 36.0–46.0)
Hemoglobin: 10 g/dL — ABNORMAL LOW (ref 12.0–15.0)
Immature Granulocytes: 1 %
Lymphocytes Relative: 10 %
Lymphs Abs: 0.9 10*3/uL (ref 0.7–4.0)
MCH: 30.6 pg (ref 26.0–34.0)
MCHC: 32.4 g/dL (ref 30.0–36.0)
MCV: 94.5 fL (ref 80.0–100.0)
Monocytes Absolute: 1.2 10*3/uL — ABNORMAL HIGH (ref 0.1–1.0)
Monocytes Relative: 13 %
Neutro Abs: 7 10*3/uL (ref 1.7–7.7)
Neutrophils Relative %: 76 %
Platelets: 431 10*3/uL — ABNORMAL HIGH (ref 150–400)
RBC: 3.27 MIL/uL — ABNORMAL LOW (ref 3.87–5.11)
RDW: 18.1 % — ABNORMAL HIGH (ref 11.5–15.5)
WBC: 9.3 10*3/uL (ref 4.0–10.5)
nRBC: 0 % (ref 0.0–0.2)

## 2020-01-21 LAB — COMPREHENSIVE METABOLIC PANEL
ALT: 25 U/L (ref 0–44)
AST: 35 U/L (ref 15–41)
Albumin: 2.1 g/dL — ABNORMAL LOW (ref 3.5–5.0)
Alkaline Phosphatase: 101 U/L (ref 38–126)
Anion gap: 8 (ref 5–15)
BUN: 14 mg/dL (ref 8–23)
CO2: 25 mmol/L (ref 22–32)
Calcium: 8.1 mg/dL — ABNORMAL LOW (ref 8.9–10.3)
Chloride: 102 mmol/L (ref 98–111)
Creatinine, Ser: 1.19 mg/dL — ABNORMAL HIGH (ref 0.44–1.00)
GFR calc Af Amer: 51 mL/min — ABNORMAL LOW (ref >60)
GFR calc non Af Amer: 44 mL/min — ABNORMAL LOW (ref >60)
Glucose, Bld: 129 mg/dL — ABNORMAL HIGH (ref 70–99)
Potassium: 3.3 mmol/L — ABNORMAL LOW (ref 3.5–5.1)
Sodium: 135 mmol/L (ref 135–145)
Total Bilirubin: 0.5 mg/dL (ref 0.3–1.2)
Total Protein: 6.1 g/dL — ABNORMAL LOW (ref 6.5–8.1)

## 2020-01-21 LAB — MAGNESIUM: Magnesium: 1.8 mg/dL (ref 1.7–2.4)

## 2020-01-21 MED ORDER — HEPARIN SOD (PORK) LOCK FLUSH 100 UNIT/ML IV SOLN
500.0000 [IU] | Freq: Once | INTRAVENOUS | Status: AC
  Administered 2020-01-21: 500 [IU] via INTRAVENOUS
  Filled 2020-01-21: qty 5

## 2020-01-21 MED ORDER — SODIUM CHLORIDE 0.9 % IV SOLN
Freq: Once | INTRAVENOUS | Status: AC
  Filled 2020-01-21: qty 250

## 2020-01-21 NOTE — Progress Notes (Signed)
[facility]  [address] [place], [place] [address] Phone: [phone]  Fax: [phone]   Clinic Day:  01/22/2020  Referring physician: Francesca Oman, DO  Chief Complaint: Toni Griffith is a 79 y.o. 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  HPI: The patient was last seen in the medical oncology clinic on 01/21/2020. At that time, she was dehydrated.  She remained in rehabilitation.  She wished to pursue treatment. Corrected calcium was 9.7 (improved). Hematocrit was 30.9, hemoglobin 10,0, MCV 94.5, platelets 431,000, WBC 9,300. Potassium was 3.3. Creatinine was 1.19 (prior 0.77).  She received IVF.  Treatment was postponed.  While in the infusion center, she developed significant wheezing which appeared positional.  She was referred to [facility] ER for treatment, but she was able to get the medication she needed at her rehab facility so she did not go to the ER.   During the interim, she has been okay. Her energy is "pretty good." She has had one episode of vomiting since yesterday. She coughs every once in a while.  She denies shortness of breath and diarrhea. The patient would like to start treatment today. She will speak with her family and make a decision about her code status.   Past Medical History:  Diagnosis Date  . Arthritis   . Cancer [facility])    lymphoma-right hip  . CHF (congestive heart failure) ([place])   . COPD (chronic obstructive pulmonary disease) ([place])   . Deaf   . GERD (gastroesophageal reflux disease)   . Hypertension    NOT ON MED AT PRESENT  . Squamous cell lung cancer [facility])     Past Surgical History:  Procedure Laterality Date  . CARDIAC SURGERY  1993  . CORONARY ARTERY BYPASS GRAFT     DOUBLE  . CYST REMOVAL HAND Right   . ENDOBRONCHIAL ULTRASOUND N/A 05/30/2018   Procedure: ENDOBRONCHIAL ULTRASOUND;  Surgeon: Tyler Pita, MD;  Location: [facility] ORS;  Service: Cardiopulmonary;  Laterality: N/A;  . PERIPHERAL VASCULAR CATHETERIZATION N/A 04/06/2016   Procedure: Glori Luis Cath Insertion;  Surgeon: Algernon Huxley, MD;  Location: [facility] LAB;  Service: Cardiovascular;  Laterality: N/A;    Family History  Problem Relation Age of Onset  . Lung cancer Mother   . Lung cancer Father   . Diabetes Maternal Uncle   . Myasthenia gravis Maternal Grandmother   . Leukemia Maternal Grandfather     Social History:  reports that she quit smoking about 4 years ago. Her smoking use included cigarettes. She has a 80.00 pack-year smoking history. She has never used smokeless tobacco. She reports current alcohol use. She reports that she does not use drugs.  She smoked 1 1/2 packs per day for 20 years (30 pack year smoking history) until 10/2015. She is hearing impaired (deaf). She lives in [place] with her boyfriend. Her daughter lives in [place]. Her daughter, [name]'s contact number is [phone].She has 2 cats named Pepper and Baby. The patient is accompanied by her son and daughter on Facetime and an interpreter today.  Allergies: No Known Allergies  Current Medications: Current Outpatient Medications  Medication Sig Dispense Refill  . acetaminophen (TYLENOL) 500 MG tablet Take 500 mg by mouth every 6 (six) hours as needed for moderate pain.     Marland Kitchen allopurinol (ZYLOPRIM) 300 MG tablet TAKE 1 TABLET  BY MOUTH EVERY DAY (Patient taking differently: Take 300 mg by mouth daily. ) 90 tablet 0  . alum & mag hydroxide-simeth (MAALOX/MYLANTA) 200-200-20 MG/5ML suspension Take 30 mLs by mouth every 4 (four) hours as needed for indigestion or heartburn. 355 mL 0  . apixaban (ELIQUIS) 5 MG TABS tablet Take 1 tablet (5 mg total) by mouth 2 (two) times daily. 60 tablet   . aspirin EC 81 MG tablet Take 81 mg by mouth daily.    Marland Kitchen b complex vitamins tablet Take 1 tablet by mouth daily.    . bisacodyl (DULCOLAX) 10 MG suppository  Place 1 suppository (10 mg total) rectally daily as needed for moderate constipation. 12 suppository 0  . Carbonyl Iron (IRON CHEWS PEDIATRIC PO) Take 5 mg by mouth daily.    . Cholecalciferol (VITAMIN D3) 2000 units capsule Take 1,000 Units by mouth daily.     . clotrimazole (LOTRIMIN) 1 % cream Apply topically 2 (two) times daily. 30 g 0  . dextromethorphan 15 MG/5ML syrup Take 10 mLs by mouth 4 (four) times daily as needed for cough.    . diclofenac Sodium (VOLTAREN) 1 % GEL Apply topically 4 (four) times daily.    . DULoxetine (CYMBALTA) 60 MG capsule Take 60 mg by mouth daily.     Marland Kitchen guaiFENesin-dextromethorphan (ROBITUSSIN DM) 100-10 MG/5ML syrup Take 5 mLs by mouth every 4 (four) hours as needed for cough. 118 mL 0  . ipratropium-albuterol (DUONEB) 0.5-2.5 (3) MG/3ML SOLN Take 3 mLs by nebulization every 4 (four) hours as needed. 360 mL 3  . lidocaine (XYLOCAINE) 2 % solution Use as directed 15 mLs in the mouth or throat 3 (three) times daily as needed for mouth pain.  0  . lidocaine-prilocaine (EMLA) cream Apply cream 1 hour before chemotherapy treatment and place small peive of saran wrap over cream to protect clothing 30 g 3  . magic mouthwash SOLN Take 15 mLs by mouth 3 (three) times daily.  0  . metoprolol tartrate (LOPRESSOR) 50 MG tablet Take 1 tablet (50 mg total) by mouth 2 (two) times daily.    . Omega-3 Fatty Acids (FISH OIL PO) Take 1 tablet by mouth daily.    . pantoprazole (PROTONIX) 40 MG tablet Take 1 tablet (40 mg total) by mouth daily. 30 tablet 2  . polyethylene glycol (MIRALAX / GLYCOLAX) 17 g packet Take 17 g by mouth 2 (two) times daily. 14 each 0  . vitamin E 400 UNIT capsule Take 400 Units by mouth daily.     Marland Kitchen atorvastatin (LIPITOR) 80 MG tablet Take 80 mg by mouth every evening.     . ondansetron (ZOFRAN ODT) 4 MG disintegrating tablet Take 1 tablet (4 mg total) by mouth every 8 (eight) hours as needed for nausea or vomiting. (Patient not taking: Reported on  01/21/2020) 30 tablet 1   No current facility-administered medications for this visit.   Facility-Administered Medications Ordered in Other Visits  Medication Dose Route Frequency Provider Last Rate Last Admin  . heparin lock flush 100 unit/mL  500 Units Intravenous Once Corcoran, Melissa C, MD      . sodium chloride flush (NS) 0.9 % injection 10 mL  10 mL Intravenous PRN Nolon Stalls C, MD   10 mL at 04/05/17 1034  . sodium chloride flush (NS) 0.9 % injection 10 mL  10 mL Intravenous PRN Lequita Asal, MD   10 mL at 02/27/19 1033    Review of Systems  Constitutional: Negative for  chills, diaphoresis, fever, malaise/fatigue and weight loss (stable).       Doing okay.  HENT: Positive for hearing loss (deaf). Negative for congestion, ear discharge, ear pain, nosebleeds, sinus pain, sore throat and tinnitus.   Eyes: Negative for blurred vision and double vision.  Respiratory: Positive for cough (occasional). Negative for hemoptysis, sputum production and shortness of breath.        On 2 liters/min of oxygen.  Cardiovascular: Negative for chest pain, palpitations and leg swelling.  Gastrointestinal: Positive for vomiting (one episode). Negative for abdominal pain, blood in stool, constipation, diarrhea, heartburn, melena and nausea (on zofran as needed).  Genitourinary: Negative for dysuria, frequency, hematuria and urgency.  Musculoskeletal: Negative for back pain, falls, joint pain, myalgias and neck pain.  Skin: Negative for itching and rash.       Intermittent pain above left breast.  Neurological: Negative for dizziness, sensory change (baseline neuropathy unchanged; left hand 2nd and 3rd digits. neuropathy in sole of feet unchanged), weakness and headaches.  Endo/Heme/Allergies: Negative for environmental allergies and polydipsia. Does not bruise/bleed easily.  Psychiatric/Behavioral: Positive for depression. Negative for memory loss. The patient is not nervous/anxious and does  not have insomnia.   All other systems reviewed and are negative.  Performance status (ECOG):  2-3  Vitals Blood pressure 113/83, pulse 88, temperature (!) 96.9 F (36.1 C), temperature source Tympanic, SpO2 98 %.   Physical Exam Vitals and nursing note reviewed.  Constitutional:      General: She is not in acute distress.    Appearance: Normal appearance. She is well-developed. She is not diaphoretic.     Interventions: Face mask in place.     Comments: Patient is sitting comfortably in the exam room in no acute distress.  She is examined in wheelchair.  HENT:     Head: Normocephalic and atraumatic.     Mouth/Throat:     Mouth: Mucous membranes are dry. No oral lesions.     Pharynx: No oropharyngeal exudate.     Comments: Linear trauma lower lip corresponding to upper dentures.  No blisters. Eyes:     General: No scleral icterus.       Right eye: No discharge.        Left eye: No discharge.     Extraocular Movements: Extraocular movements intact.     Conjunctiva/sclera: Conjunctivae normal.     Pupils: Pupils are equal, round, and reactive to light.     Comments: Hazel eyes.  Neck:     Vascular: No JVD.  Cardiovascular:     Rate and Rhythm: Normal rate and regular rhythm.     Pulses: Normal pulses.     Heart sounds: Normal heart sounds. No murmur heard.  No friction rub. No gallop.   Pulmonary:     Effort: Pulmonary effort is normal. No respiratory distress.     Breath sounds: Normal breath sounds. No wheezing, rhonchi or rales.     Comments: Decreased breath sounds left mid lung field (chronic). Chest:     Chest wall: No tenderness.  Abdominal:     General: Bowel sounds are normal. There is no distension.     Palpations: Abdomen is soft. There is no mass.     Tenderness: There is no abdominal tenderness. There is no guarding or rebound.  Musculoskeletal:        General: No swelling or tenderness. Normal range of motion.     Cervical back: Normal range of motion and  neck supple.  Left knee: No swelling.  Lymphadenopathy:     Head:     Right side of head: No preauricular, posterior auricular or occipital adenopathy.     Left side of head: No preauricular, posterior auricular or occipital adenopathy.     Cervical: No cervical adenopathy.     Upper Body:     Right upper body: No supraclavicular or axillary adenopathy.     Left upper body: No supraclavicular or axillary adenopathy.     Lower Body: No right inguinal adenopathy. No left inguinal adenopathy.  Skin:    General: Skin is warm and dry.     Coloration: Skin is not pale.     Findings: No bruising, erythema, lesion or rash.  Neurological:     Mental Status: She is alert and oriented to person, place, and time. Mental status is at baseline.  Psychiatric:        Behavior: Behavior normal.        Thought Content: Thought content normal.        Judgment: Judgment normal.    Imaging studies: 03/03/2016:  PET scanrevealed a hypermetabolic left lower lobe mass, bilateral pleural and pulmonary parenchymal nodular metastasis, a large pleural lesion invading the anterior left chest wall. There was activity in the superior left ocular orbit. There was a large lesion centered in the right iliac wing with soft tissue extension. There were hypermetabolic right external iliac lymph nodes 01/29/2017:  PET scanrevealed interval progression of hypermetabolic nodules in the right upper (13 mm compared to 9 mm; SUV 10) and left lower lobes (26 mm compared to 22 mm; SUV 7.5). There was new hypermetabolic focus of activity along the anterior left pleura although no underlying pleural or lung mass was evident. 06/06/2019:  Chest, abdomen, and pelvis CTrevealed an interval mass-like opacity in the superior aspect of the left parahilar lungwith an interval more prominent mass-like component involving the pleura laterally at the level with slightly more prominent extension into the left hila region, suspicious  for recurrent or progressive lung cancer. There was mediastinal and bilateral hilar adenopathyc/wmetastatic adenopathy or involvement with the patient's B-cell lymphoma. There was no evidence of metastatic disease or lymphoma involving the abdomen or pelvis. There was significantly improved ground-glass opacities in the right lung, minimal left pleural fluid(improved), andastable large hiatal hernia.There was stable enlarged thyroid gland containing heterogeneous nodules with substernal extension on the left.There was stable to slightly decreased size of the previously demonstrated right adrenal nodule and no significant change in a small, poorly visualized left adrenal nodule. 06/17/2019:  PET scanrevealed a known left parahilar lung mass markedly hypermetabolic with associated hypermetabolic lymphadenopathy in both hilar regions, the mediastinum, andthe right thoracic inlet. There was hypermetabolism in the right submandibular gland, indeterminate. There was no hypermetabolic metastatic disease evident in the abdomen or pelvis. There was a stable bilateral adrenal adenomas since 01/29/2018. There were tiny left pleural effusion and a large hiatal hernia.There was a segment of transverse colon extendingup into the hiatal hernia without obstructive features.  12/02/2019:  Chest, abdomen and pelvis CT revealed interval resolution of mediastinal and hilar adenopathy c/w response to therapy. 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 There was increased moderate-sized dependent left pleural effusion with increased left lower lobe compressive atelectasis. There was a new 6 mm nodule in the superior segment of the right lower lobe, nonspecific, but potentially a metastasis. Attention on follow-up was recommended.  There was no evidence of  metastatic disease in the abdomen or pelvis.  There was a  moderate-sized hiatal hernia and bilateral adrenal adenomas.    Appointment on 01/22/2020  Component Date Value Ref Range Status  . Sodium 01/22/2020 132* 135 - 145 mmol/L Final  . Potassium 01/22/2020 3.0* 3.5 - 5.1 mmol/L Final  . Chloride 01/22/2020 102  98 - 111 mmol/L Final  . CO2 01/22/2020 23  22 - 32 mmol/L Final  . Glucose, Bld 01/22/2020 122* 70 - 99 mg/dL Final   Glucose reference range applies only to samples taken after fasting for at least 8 hours.  . BUN 01/22/2020 14  8 - 23 mg/dL Final  . Creatinine, Ser 01/22/2020 1.24* 0.44 - 1.00 mg/dL Final  . Calcium 01/22/2020 7.9* 8.9 - 10.3 mg/dL Final  . Total Protein 01/22/2020 6.2* 6.5 - 8.1 g/dL Final  . Albumin 01/22/2020 2.3* 3.5 - 5.0 g/dL Final  . AST 01/22/2020 34  15 - 41 U/L Final  . ALT 01/22/2020 24  0 - 44 U/L Final  . Alkaline Phosphatase 01/22/2020 103  38 - 126 U/L Final  . Total Bilirubin 01/22/2020 0.6  0.3 - 1.2 mg/dL Final  . GFR calc non Af Amer 01/22/2020 42* >60 mL/min Final  . GFR calc Af Amer 01/22/2020 49* >60 mL/min Final  . Anion gap 01/22/2020 7  5 - 15 Final   Performed at [facility], [address] [place], [place] [address]  Infusion on 01/21/2020  Component Date Value Ref Range Status  . Magnesium 01/21/2020 1.8  1.7 - 2.4 mg/dL Final   Performed at [facility], [address] [place], [place] [address]  . Sodium 01/21/2020 135  135 - 145 mmol/L Final  . Potassium 01/21/2020 3.3* 3.5 - 5.1 mmol/L Final  . Chloride 01/21/2020 102  98 - 111 mmol/L Final  . CO2 01/21/2020 25  22 - 32 mmol/L Final  . Glucose, Bld 01/21/2020 129* 70 - 99 mg/dL Final   Glucose reference range applies only to samples taken after fasting for at least 8 hours.  . BUN 01/21/2020 14  8 - 23 mg/dL Final  . Creatinine, Ser 01/21/2020 1.19* 0.44 - 1.00 mg/dL Final  . Calcium 01/21/2020 8.1* 8.9 - 10.3 mg/dL Final  . Total Protein 01/21/2020 6.1* 6.5 - 8.1 g/dL Final  . Albumin 01/21/2020  2.1* 3.5 - 5.0 g/dL Final  . AST 01/21/2020 35  15 - 41 U/L Final  . ALT 01/21/2020 25  0 - 44 U/L Final  . Alkaline Phosphatase 01/21/2020 101  38 - 126 U/L Final  . Total Bilirubin 01/21/2020 0.5  0.3 - 1.2 mg/dL Final  . GFR calc non Af Amer 01/21/2020 44* >60 mL/min Final  . GFR calc Af Amer 01/21/2020 51* >60 mL/min Final  . Anion gap 01/21/2020 8  5 - 15 Final   Performed at [facility], [address] [place], [place] [address]  . WBC 01/21/2020 9.3  4.0 - 10.5 K/uL Final  . RBC 01/21/2020 3.27* 3.87 - 5.11 MIL/uL Final  . Hemoglobin 01/21/2020 10.0* 12.0 - 15.0 g/dL Final  . HCT 01/21/2020 30.9* 36 - 46 % Final  . MCV 01/21/2020 94.5  80.0 - 100.0 fL Final  . MCH 01/21/2020 30.6  26.0 - 34.0 pg Final  . MCHC 01/21/2020 32.4  30.0 - 36.0 g/dL Final  . RDW 01/21/2020 18.1* 11.5 - 15.5 % Final  . Platelets 01/21/2020 431* 150 - 400 K/uL  Final  . nRBC 01/21/2020 0.0  0.0 - 0.2 % Final  . Neutrophils Relative % 01/21/2020 76  % Final  . Neutro Abs 01/21/2020 7.0  1.7 - 7.7 K/uL Final  . Lymphocytes Relative 01/21/2020 10  % Final  . Lymphs Abs 01/21/2020 0.9  0.7 - 4.0 K/uL Final  . Monocytes Relative 01/21/2020 13  % Final  . Monocytes Absolute 01/21/2020 1.2* 0 - 1 K/uL Final  . Eosinophils Relative 01/21/2020 0  % Final  . Eosinophils Absolute 01/21/2020 0.0  0 - 0 K/uL Final  . Basophils Relative 01/21/2020 0  % Final  . Basophils Absolute 01/21/2020 0.0  0 - 0 K/uL Final  . Immature Granulocytes 01/21/2020 1  % Final  . Abs Immature Granulocytes 01/21/2020 0.12* 0.00 - 0.07 K/uL Final   Performed at [facility], [address] [place], [place] [address]    Assessment:  Toni Griffith is a 79 y.o. female withstage IVB B-cell lymphoma, unclassifiable, with features intermediate between diffuse large B-cell lymphoma and classical Hodgkin's lymphoma ("gray zone" lymphoma). She underwent right iliac wing bone/bone marrow biopsy on 03/08/2016.   PET scanon 03/03/2016 revealed a hypermetabolic left lower lobe mass, bilateral pleural and pulmonary parenchymal nodular metastasis, a large pleural lesion invading the anterior left chest wall. There was activity in the superior left ocular orbit. There was a large lesion centered in the right iliac wing with soft tissue extension. There were hypermetabolic right external iliac lymph nodes  [place] 04/04/2016 revealed an EF of 55-60%. Hepatitis B and C testing was negative on 03/30/2016. G6PD assay was normal. She declined LP with MTX prophylaxis.  She received 6 cycles of mini-RCHOP(04/07/2016 - 07/21/2016). She has had some transient fingertip numbness.  She received radiationto the right iliac wing, from 10/11/2016 - 11/01/2016. She received 3000 cGy over 3 weeks.  She has squamous cell lung cancer. CT guided left lower lobe nodule biopsyon 02/19/2017 confirmed squamous cell carcinoma. She underwent SBRT08/27/2018 - 04/09/2017. She received 5000 cGy in 5 fractions to the left lower lobe lesion. She underwent SBRTto a RUL lesion from 05/23/2017 - 06/18/2017.  CT guided left upper lobe nodule biopsyon 02/20/2018 revealedsquamous cell lung cancer. She received SBRTfrom 04/09/2018 - 04/15/2018. She received 6000 cGy in 5 fractions.   Foundation Oneon the lung biopsy on 03/15/2018 revealed MS-stable, tumor mutational burden 5 Muts/Mb, AKT2 amplification, CCNE1 amplification, CREBBP R1446L, KDR amplification, KIT amplification, PDGFRA amplification, PTEN loss exons 2-4, and ME26 splice site [id number]>Q. Marland Kitchen There were no alterations in EGFR, KRAS, RET, ALK, MET, ERBB2, BRAF, ROS1. PDL-1IHC analysis revealed a TPS score of 50%.  Bronchoscopyon 05/30/2018 revealed no endobronchial lesions. The 2 cm pre-carinal lymph node was biopsied. Pathology revealed metastatic non-small cell carcinoma, favor squamous cell carcinoma.  CT guided right paraspinal lesion biopsyon  10/16/2017 was compatible with involvement by the patient's previously diagnosed lymphoma. The case was referred to [facility] hematopathology. The neoplastic cells were largely CD30 negative. Repeat CD30 immunohistochemistry faintly stained >10% of the large atypical cells. CD20 stained the scattered large atypical cells and few small cells.   She received 3 cycles of brentuximab vedotin(11/23/2017 - 01/11/2018).Her lymphoma progressed.  Shereceived14cycles of pembrolizumab(06/14/2018 -05/21/2019).   She received 4 cycles ofpembolizumab, carboplatin and Abraxane (08/29/2019 - 11/05/2019). She experienced tinnitus after her first day of chemotherapy. Tinnitus was minor with cycle #2.  She received day 1 of cycle #1 gemcitabine (01/01/2020).  She declined further gemcitabine.  Shereceived4000cGyto the left hilar regionfrom12/14/2020 -08/15/2019.  Chest, abdomen and pelvis CT on 12/02/2019 revealed interval resolution of mediastinal and hilar adenopathy c/w response to therapy. 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 There was increased moderate-sized dependent left pleural effusion with increased left lower lobe compressive atelectasis. There was a new 6 mm nodule in the superior segment of the right lower lobe, nonspecific, but potentially a metastasis. Attention on follow-up was recommended.  There was no evidence of metastatic disease in the abdomen or pelvis.  There was a moderate-sized hiatal hernia and bilateral adrenal adenomas.   Ultrasound guided left thoracentesis on 12/05/2019 revealed non-small cell carcinoma.   She hasiron deficiency anemia. Ferritinhas been followed: 14 on 05/06/2018,23 on 07/26/2018, and 59 on 11/01/2018. She denies any bleeding.  She has a grade I neuropathy. B12 and folate were normal on 12/06/2017. She has severe bilateral knee  osteoarthritis. She has received steroid injections and Monovisc (hyaluronate derivatives) without improvement.   She was admitted to [facility] from 12/09/2019-12/11/2019 with chills, diaphoresis, tachycardia, and tachypnea for r/o sepsis. She was treated with broad spectrum antibiotics (vancomycin and Cefepime). Cultures remained negative.  She was discharged on Augmentin.  She was switched to Levaquin on 12/25/2019.  She was admitted to [facility] from 01/03/2020 - 01/09/2020 with swollen lips and mouth burning felt secondary to medication (? gemcitabine), chronic respiratory failure, pneumonia, and new onset A. fib with RVR.  She was treated with Cefepime then switched to Cefdinir, Eliquis, and metoprolol.  She was discharged to rehabilitation.  She has malignancy associated hypercalcemia.  She received Zometa on 01/14/2020.  She has atrial fibrillation and is on Eliquis.  She has NYHA class II chronic heart failure.  Echo on 01/04/2020 revealed an EF of 60-65%.  She is deafand requires an interpreter.  Symptomatically, she feels "ok".  She denies increased shortness of breath.  She feels ready to begin chemotherapy.   Potassium is 3.0.  Plan: 1.   Labs today: CMP. 2.Gray zone lymphoma Clinically, her lymphoma appears controlled M reveals no adenopathy or hepatosplenomegaly She has received 6 cycles of mini-RCOP, 3 cycles ofbrentuximab vedotin, and 16cycles of pembrolizumab. PET scan on 06/17/2019 revealed progressive disease in the chest felt secondary toprogressive lung cancer. She is s/p6additional cycle of maintenance pembrolizumab (last 01/01/2020). Consider reinitiation of pembrolizumab after first cycle of Navelbine.  3. Clinical stage IV squamous cell lung cancer Clinically, she has a persistent postobstructive process in her left lung. Patient is s/p SBRT to left upper lobe and left lower lobe nodules. Patient is s/p 15cyclesof pembrolizumab.  Patient is s/p palliative radiation to the right hilar region (completed 08/15/2019). She is s/p4cycles of carboplatin and Abraxane (08/29/2019 - 11/05/2019). Chest, abdomen and pelvis CT scan on 12/02/2019 revealed a new moderate left-sided effusion.  Thoracentesis on 12/05/2019 confirmed non-small cell lung cancer.  No clinical trial available at [facility].  She received day 1 of cycle #1 gemcitabine.  Course was complicated  Review plans for Eugene Garnet been (2 weeks on and 1 week off).   Side effects we reviewed.   Consented to treatment today.  Labs reviewed.  Begin day 1 of cycle #1Navelbine today.  Discuss symptom management.  She has antiemetics and pain medications at home to use on a prn bases.  Interventions are adequate.    4.Recurrent left sided pleural effusion  Patient is s/p thoracentesis on 12/05/2019 which confirmed non-small cell lung cancer.  Plan for Pleur-X catheter if fluid reaccumulates.  No current evidence of increasing pleural effusion.  A stent would not be benificial as obstruction is too distal.  She is followed by Dr. Patsey Berthold. 5.   Post obstructive pneumonia  Clinically, he remains stable.  She has completed several rounds of antibiotics.    Continue to monitor closely 6.   Malignancy associated hypercalcemia  Calcium 10.5 (corrected 11.86) on 01/14/2020.  She receives Zometa.  Calcium 7.9 (corrected 9.345) today.  Continue to monitor 7.   Chemotherapy-induced neuropathy Neuropathy remains stable. 8. Cancer related pain  Pain is well managed.   9.   Dehydration  She was dehydrated yesterday.  Continue to encourage fluids. 10.  Reactive airway disease  Patient received a nebulized treatment last week.  Wheezing appeared positional.  Continue to monitor. 11.   Hypokalemia  Potassium 3.0.  Potassium 20 meq IV today.  Discuss oral potassium 20 meq a day. 12.   Code status  Code status is FULL. 13.   Day 1 of cycle #1  navelbine. 14.   RTC in 1 week for MD assessment, labs (CBC with diff, CMP, Mg), and day 8 of cycle #1 navelbine.  I discussed the assessment and treatment plan with the patient.  The patient was provided an opportunity to ask questions and all were answered.  The patient agreed with the plan and demonstrated an understanding of the instructions.  The patient was advised to call back if the symptoms worsen or if the condition fails to improve as anticipated.   Lequita Asal, MD, PhD    01/22/2020, 10:37 AM  I, De Burrs, am acting as scribe for [employer]. Mike Gip, MD, PhD.  I, Melissa C. Mike Gip, MD, have reviewed the above documentation for accuracy and completeness, and I agree with the above.

## 2020-01-21 NOTE — Addendum Note (Signed)
Addended by: Claudette Head A on: 01/21/2020 04:35 PM   Modules accepted: Orders

## 2020-01-21 NOTE — Progress Notes (Signed)
No new changes noted today. The patient still has a cough.

## 2020-01-21 NOTE — Telephone Encounter (Addendum)
Pt's daughter, [name] [facility]) is requesting a mask for nebulizer verses mouth piece.  Per Dr. Patsey Berthold verbally okay to order.  Order has been placed.  Left detailed message for pt's daughter, [name].  Nothing further is needed.

## 2020-01-21 NOTE — Progress Notes (Signed)
Patient is still tired and sleepy a lot she thinks its from all the meds shes on. Patient is on 2 liters of oxygen. O2 is 99%

## 2020-01-22 ENCOUNTER — Inpatient Hospital Stay: Payer: Medicare HMO

## 2020-01-22 ENCOUNTER — Inpatient Hospital Stay (HOSPITAL_BASED_OUTPATIENT_CLINIC_OR_DEPARTMENT_OTHER): Payer: Medicare HMO | Admitting: Hematology and Oncology

## 2020-01-22 ENCOUNTER — Telehealth: Payer: Self-pay | Admitting: Pulmonary Disease

## 2020-01-22 ENCOUNTER — Encounter: Payer: Self-pay | Admitting: Hematology and Oncology

## 2020-01-22 ENCOUNTER — Other Ambulatory Visit: Payer: Self-pay

## 2020-01-22 VITALS — BP 113/83 | HR 88 | Temp 96.9°F

## 2020-01-22 DIAGNOSIS — E876 Hypokalemia: Secondary | ICD-10-CM

## 2020-01-22 DIAGNOSIS — C3492 Malignant neoplasm of unspecified part of left bronchus or lung: Secondary | ICD-10-CM

## 2020-01-22 DIAGNOSIS — Z7189 Other specified counseling: Secondary | ICD-10-CM

## 2020-01-22 DIAGNOSIS — C8518 Unspecified B-cell lymphoma, lymph nodes of multiple sites: Secondary | ICD-10-CM | POA: Diagnosis not present

## 2020-01-22 DIAGNOSIS — Z5111 Encounter for antineoplastic chemotherapy: Secondary | ICD-10-CM

## 2020-01-22 DIAGNOSIS — E871 Hypo-osmolality and hyponatremia: Secondary | ICD-10-CM

## 2020-01-22 DIAGNOSIS — C7951 Secondary malignant neoplasm of bone: Secondary | ICD-10-CM | POA: Diagnosis not present

## 2020-01-22 DIAGNOSIS — G893 Neoplasm related pain (acute) (chronic): Secondary | ICD-10-CM | POA: Diagnosis not present

## 2020-01-22 LAB — COMPREHENSIVE METABOLIC PANEL
ALT: 24 U/L (ref 0–44)
AST: 34 U/L (ref 15–41)
Albumin: 2.3 g/dL — ABNORMAL LOW (ref 3.5–5.0)
Alkaline Phosphatase: 103 U/L (ref 38–126)
Anion gap: 7 (ref 5–15)
BUN: 14 mg/dL (ref 8–23)
CO2: 23 mmol/L (ref 22–32)
Calcium: 7.9 mg/dL — ABNORMAL LOW (ref 8.9–10.3)
Chloride: 102 mmol/L (ref 98–111)
Creatinine, Ser: 1.24 mg/dL — ABNORMAL HIGH (ref 0.44–1.00)
GFR calc Af Amer: 49 mL/min — ABNORMAL LOW (ref >60)
GFR calc non Af Amer: 42 mL/min — ABNORMAL LOW (ref >60)
Glucose, Bld: 122 mg/dL — ABNORMAL HIGH (ref 70–99)
Potassium: 3 mmol/L — ABNORMAL LOW (ref 3.5–5.1)
Sodium: 132 mmol/L — ABNORMAL LOW (ref 135–145)
Total Bilirubin: 0.6 mg/dL (ref 0.3–1.2)
Total Protein: 6.2 g/dL — ABNORMAL LOW (ref 6.5–8.1)

## 2020-01-22 MED ORDER — ONDANSETRON HCL 4 MG PO TABS
8.0000 mg | ORAL_TABLET | Freq: Once | ORAL | Status: AC
  Administered 2020-01-22: 8 mg via ORAL
  Filled 2020-01-22: qty 2

## 2020-01-22 MED ORDER — VINORELBINE TARTRATE CHEMO INJECTION 50 MG/5ML
20.0000 mg/m2 | Freq: Once | INTRAVENOUS | Status: AC
  Administered 2020-01-22: 37 mg via INTRAVENOUS
  Filled 2020-01-22: qty 3.7

## 2020-01-22 MED ORDER — SODIUM CHLORIDE 0.9 % IV SOLN
Freq: Once | INTRAVENOUS | Status: AC
  Filled 2020-01-22: qty 250

## 2020-01-22 MED ORDER — HEPARIN SOD (PORK) LOCK FLUSH 100 UNIT/ML IV SOLN
INTRAVENOUS | Status: AC
  Filled 2020-01-22: qty 5

## 2020-01-22 MED ORDER — POTASSIUM CHLORIDE CRYS ER 10 MEQ PO TBCR: 20.0000 meq | EXTENDED_RELEASE_TABLET | Freq: Every day | ORAL | 0 refills | Status: DC

## 2020-01-22 MED ORDER — HEPARIN SOD (PORK) LOCK FLUSH 100 UNIT/ML IV SOLN
500.0000 [IU] | Freq: Once | INTRAVENOUS | Status: AC | PRN
  Administered 2020-01-22: 500 [IU]
  Filled 2020-01-22: qty 5

## 2020-01-22 MED ORDER — POTASSIUM CHLORIDE 20 MEQ/100ML IV SOLN
20.0000 meq | Freq: Once | INTRAVENOUS | Status: AC
  Administered 2020-01-22: 20 meq via INTRAVENOUS

## 2020-01-22 NOTE — Telephone Encounter (Signed)
Order was placed on 01/21/2020.  [name], please advise. Thanks

## 2020-01-23 ENCOUNTER — Non-Acute Institutional Stay: Payer: Medicare HMO | Admitting: Nurse Practitioner

## 2020-01-23 ENCOUNTER — Encounter: Payer: Self-pay | Admitting: Nurse Practitioner

## 2020-01-23 ENCOUNTER — Other Ambulatory Visit: Payer: Self-pay

## 2020-01-23 DIAGNOSIS — Z515 Encounter for palliative care: Secondary | ICD-10-CM

## 2020-01-23 DIAGNOSIS — C3490 Malignant neoplasm of unspecified part of unspecified bronchus or lung: Secondary | ICD-10-CM

## 2020-01-23 NOTE — Telephone Encounter (Signed)
CM sent to [facility] x 2 .  Spoke with [name] with [employer] this morning. She has located the referral for the Nebulizer Mask and will get this sent out to patient. Called and spoke with patient's daughter to advise that Toni Griffith does have the order and will be processed ASAP.  Nothing else needed at this time. [name]

## 2020-01-23 NOTE — Progress Notes (Signed)
[facility] Consult Note Telephone: [phone]  Fax: [phone]  PATIENT NAME: Toni Griffith DOB: 10/05/1940 MRN: [id number]  PRIMARY CARE PROVIDER:   Francesca Oman, DO  REFERRING PROVIDER:  Dr [facility] RESPONSIBLE PARTY:    Daughter;  Boris Sharper [phone] Son  Chriselda Leppert [phone]  [phone]  I was asked by Dr Nyra Capes to see 79 y.o. year old female for Palliative care consult for goals of care   RECOMMENDATIONS and PLAN:  1. ACP: Full code  2. Palliative care encounter; Palliative medicine team will continue to support patient, patient's family, and medical team. Visit consisted of counseling and education dealing with the complex and emotionally intense issues of symptom management and palliative care in the setting of serious and potentially life-threatening illness  I spent 90 minutes providing this consultation,  Start at 1:00pm. More than 50% of the time in this consultation was spent coordinating communication.   HISTORY OF PRESENT ILLNESS:  Toni Griffith is a 79 y.o. year old female with multiple medical problems includingSquamous cell lung cancer, Non-Hodgkin's B cell lymphoma, deaf, COPD, congestive heart failure, cabg, hypertension, gerd, OA, anemia, B12 deficiency, chronic kidney disease, chemotherapy-induced peripheral neuropathy, restless leg syndrome, . Hospitalize 6 / 11/2019 to 6 / 11 / 2021 for burning swollen lips and mouth. Using an actual vent inhaler and new chemotherapy infusion. New onset afib with rvr started on Eliquis and metoprolol. Protein calorie malnutrition and generalized weakness. Patient is deaf and mute. Seen 6/ 24/2021 by Dr Mike Gip Oncologist I noticed she was saying in the medical oncology Clinic 6/23 / 2021 with hydrated and wish to pursue treatment she received IV fluids treatment was postponed. While at the infusion center she develop wheezing. She  returned to the rehab facility. Toni Griffith does continue to reside at [facility] for short-term rehab and will be discharging home with her son today. Toni Griffith does require assistance with transferring, mobility, adl's. She is getting more strength to be able to do that on her own. Toni Griffith does have a rolling walker. That's why it does feed herself and appetite has slowly been improving. Staff endorses no concern. Toni Griffith does remain on 2Lcontinuous oxygen to liters. At present Toni Griffith is sitting on the side of her bed with her son present. Toni Griffith does appear chronically ill but comfortable, smiling and interacting signing with her son. I visited and observe Toni Griffith. We talked about purpose of palliative care visit. Toni Griffith in agreement. We talked about how she was feeling. Toni. Griffith endorses she is doing better. Toni Griffith is ready to go home. We talked about symptoms of pain and shortness of breath what she denies. We talked about the oxygen. We talked about past medical history briefly chronic disease. We talked about recent chemotherapy treatment yesterday and next infusion next week. We talked about medical goals of care. We talked about her appetite. We talked about therapy at home. We talked about palliative care at home. Toni Griffith and Son all in agreement to continue palliative care to follow at home. We talked about medical goals of care. We talked about code status as she remains a full code. We talked about role of palliative care and plan of care. Follow-up palliative care visit scheduled. Therapeutic listening and emotional support provided. Contact information. Questions answered to satisfaction.   CODE STATUS: Full code  PPS: 40% HOSPICE ELIGIBILITY/DIAGNOSIS: TBD  PAST MEDICAL HISTORY:  Past Medical History:  Diagnosis Date  . Arthritis   . Cancer [facility])    lymphoma-right hip  . CHF (congestive heart failure) ([place])   . COPD (chronic obstructive pulmonary disease)  ([place])   . Deaf   . GERD (gastroesophageal reflux disease)   . Hypertension    NOT ON MED AT PRESENT  . Squamous cell lung cancer ([place])     SOCIAL HX:  Social History   Tobacco Use  . Smoking status: Former Smoker    Packs/day: 2.00    Years: 40.00    Pack years: 80.00    Types: Cigarettes    Quit date: 11/21/2015    Years since quitting: 4.1  . Smokeless tobacco: Never Used  Substance Use Topics  . Alcohol use: Yes    Comment: rare    ALLERGIES: No Known Allergies   PERTINENT MEDICATIONS:  Outpatient Encounter Medications as of 01/23/2020  Medication Sig  . acetaminophen (TYLENOL) 500 MG tablet Take 500 mg by mouth every 6 (six) hours as needed for moderate pain.   Marland Kitchen allopurinol (ZYLOPRIM) 300 MG tablet TAKE 1 TABLET BY MOUTH EVERY DAY (Patient taking differently: Take 300 mg by mouth daily. )  . alum & mag hydroxide-simeth (MAALOX/MYLANTA) 200-200-20 MG/5ML suspension Take 30 mLs by mouth every 4 (four) hours as needed for indigestion or heartburn.  Marland Kitchen apixaban (ELIQUIS) 5 MG TABS tablet Take 1 tablet (5 mg total) by mouth 2 (two) times daily.  Marland Kitchen aspirin EC 81 MG tablet Take 81 mg by mouth daily.  Marland Kitchen atorvastatin (LIPITOR) 80 MG tablet Take 80 mg by mouth every evening.   Marland Kitchen b complex vitamins tablet Take 1 tablet by mouth daily.  . bisacodyl (DULCOLAX) 10 MG suppository Place 1 suppository (10 mg total) rectally daily as needed for moderate constipation.  Carin Hock Iron (IRON CHEWS PEDIATRIC PO) Take 5 mg by mouth daily.  . Cholecalciferol (VITAMIN D3) 2000 units capsule Take 1,000 Units by mouth daily.   . clotrimazole (LOTRIMIN) 1 % cream Apply topically 2 (two) times daily.  Marland Kitchen dextromethorphan 15 MG/5ML syrup Take 10 mLs by mouth 4 (four) times daily as needed for cough.  . diclofenac Sodium (VOLTAREN) 1 % GEL Apply topically 4 (four) times daily.  . DULoxetine (CYMBALTA) 60 MG capsule Take 60 mg by mouth daily.   Marland Kitchen guaiFENesin-dextromethorphan (ROBITUSSIN DM) 100-10 MG/5ML  syrup Take 5 mLs by mouth every 4 (four) hours as needed for cough.  Marland Kitchen ipratropium-albuterol (DUONEB) 0.5-2.5 (3) MG/3ML SOLN Take 3 mLs by nebulization every 4 (four) hours as needed.  . lidocaine (XYLOCAINE) 2 % solution Use as directed 15 mLs in the mouth or throat 3 (three) times daily as needed for mouth pain.  Marland Kitchen lidocaine-prilocaine (EMLA) cream Apply cream 1 hour before chemotherapy treatment and place small peive of saran wrap over cream to protect clothing  . magic mouthwash SOLN Take 15 mLs by mouth 3 (three) times daily.  . metoprolol tartrate (LOPRESSOR) 50 MG tablet Take 1 tablet (50 mg total) by mouth 2 (two) times daily.  . Omega-3 Fatty Acids (FISH OIL PO) Take 1 tablet by mouth daily.  . ondansetron (ZOFRAN ODT) 4 MG disintegrating tablet Take 1 tablet (4 mg total) by mouth every 8 (eight) hours as needed for nausea or vomiting. (Patient not taking: Reported on 01/21/2020)  . pantoprazole (PROTONIX) 40 MG tablet Take 1 tablet (40 mg total) by mouth daily.  . polyethylene glycol (MIRALAX / GLYCOLAX) 17 g packet Take 17  g by mouth 2 (two) times daily.  . potassium chloride SA (KLOR-CON) 10 MEQ tablet Take 2 tablets (20 mEq total) by mouth daily.  . vitamin E 400 UNIT capsule Take 400 Units by mouth daily.    Facility-Administered Encounter Medications as of 01/23/2020  Medication  . heparin lock flush 100 unit/mL  . sodium chloride flush (NS) 0.9 % injection 10 mL  . sodium chloride flush (NS) 0.9 % injection 10 mL    PHYSICAL EXAM:   General: chronically ill, pale, pleasant frail appearing, thin female Cardiovascular: regular rate and rhythm Pulmonary: clear ant fields Neurological: generalized weakness  Sohana Austell Ihor Gully, NP

## 2020-01-27 ENCOUNTER — Telehealth: Payer: Self-pay | Admitting: Pulmonary Disease

## 2020-01-27 NOTE — Telephone Encounter (Signed)
Called and spoke to pt's daughter, [name] ([place]).  [name] stated that pt was recently discharged from rehab. While in rehab pt wore oxygen continuously. [name] stated that pt's oxygen levels are around 89-91% on room air at rest.  Per our records, pt should only be wearing oxygen at night.  [name] is concerned pt should be wearing oxygen at all times.  [name] is requesting a POC if pt needs to wear oxygen during the day.  Per pur records, it has only been prescribed night time oxygen, therefore a walk test will be needed to qualify pt for daytime oxygen.  [name] wished to reach out to the rehab facility that pt attended to see if they had qualified her. She will call back with an update.

## 2020-01-27 NOTE — Telephone Encounter (Signed)
Spoke to Dr. Patsey Berthold, who recommended that pt wear oxygen as needed during the day to above 90%.  Toni Griffith has been made aware of this information.  Will await call back from [facility] regarding qualification.

## 2020-01-28 NOTE — Progress Notes (Signed)
[facility]  [address], [address] [place], [place] [address] Phone: [phone]  Fax: [phone]   Clinic Day:  01/29/2020  Referring physician: Francesca Oman, DO  Chief Complaint: Toni Griffith is a 79 y.o. 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  HPI: The patient was last seen in the medical oncology clinic on 01/22/2020. At that time, she felt "pretty good".  She had an intermittent cough.  She resided at the rehab facility gaining strength to be able to manage her ADLs.  She wished to proceed with chemotherapy.  Sodium was 132, potassium 3.0, creatinine1.24, and albumin 2.3.  She received potassium 20 meq IV.  She began oral potassium.  She received day 1 of cycle #1 navelbine.  The patient was seen by Natalia Leatherwood, NP from [facility] on 01/23/2020.  She was discharged home from [facility] on 01/25/2020.  During the interim, she has been feeling better. After her last treatment, she just felt tired and low energy. She also has not been eating or drinking a lot.  She is very happy to be home with her family.  She reports shortness of breath on exertion and constipation. The pain denies any pain at this time. Her son takes care of her and helps her walk around with her walker. The patient needs help changing her clothes and going to the bathroom.  The patient would like to keep getting treatment but her daughter is unsure whether she is strong enough. After a long discussion, decision was made to defer treatment until 02/04/2020.    Past Medical History:  Diagnosis Date  . Arthritis   . Cancer [facility])    lymphoma-right hip  . CHF (congestive heart failure) ([place])   . COPD (chronic obstructive pulmonary disease) ([place])   . Deaf   . GERD (gastroesophageal reflux disease)   . Hypertension    NOT ON MED AT PRESENT  . Squamous cell lung  cancer [facility])     Past Surgical History:  Procedure Laterality Date  . CARDIAC SURGERY  1993  . CORONARY ARTERY BYPASS GRAFT     DOUBLE  . CYST REMOVAL HAND Right   . ENDOBRONCHIAL ULTRASOUND N/A 05/30/2018   Procedure: ENDOBRONCHIAL ULTRASOUND;  Surgeon: Tyler Pita, MD;  Location: [facility] ORS;  Service: Cardiopulmonary;  Laterality: N/A;  . PERIPHERAL VASCULAR CATHETERIZATION N/A 04/06/2016   Procedure: Glori Luis Cath Insertion;  Surgeon: Algernon Huxley, MD;  Location: [facility];  Service: Cardiovascular;  Laterality: N/A;    Family History  Problem Relation Age of Onset  . Lung cancer Mother   . Lung cancer Father   . Diabetes Maternal Uncle   . Myasthenia gravis Maternal Grandmother   . Leukemia Maternal Grandfather     Social History:  reports that she quit smoking about 4 years ago. Her smoking use included cigarettes. She has a 80.00 pack-year smoking history. She has never used smokeless tobacco. She reports current alcohol use. She reports that she does not use drugs.  She smoked 1 1/2 packs per day for 20 years (30 pack year smoking history) until 10/2015. She is hearing impaired (deaf). She lives in [place] with her boyfriend. Her daughter lives in [place]. Her daughter, [name]'s contact number is [phone].She has 2 cats named Pepper and Baby. The patient is accompanied by her daughter on Carolee Rota, son  on the iPad, and an interpreter today.  Allergies: No Known Allergies  Current Medications: Current Outpatient Medications  Medication Sig Dispense Refill  . acetaminophen (TYLENOL) 500 MG tablet Take 500 mg by mouth every 6 (six) hours as needed for moderate pain.     Marland Kitchen allopurinol (ZYLOPRIM) 300 MG tablet TAKE 1 TABLET BY MOUTH EVERY DAY (Patient taking differently: Take 300 mg by mouth daily. ) 90 tablet 0  . alum & mag hydroxide-simeth (MAALOX/MYLANTA) 200-200-20 MG/5ML suspension Take 30 mLs by mouth every 4 (four) hours as needed for indigestion or  heartburn. 355 mL 0  . apixaban (ELIQUIS) 5 MG TABS tablet Take 1 tablet (5 mg total) by mouth 2 (two) times daily. 60 tablet   . aspirin EC 81 MG tablet Take 81 mg by mouth daily.    . bisacodyl (DULCOLAX) 10 MG suppository Place 1 suppository (10 mg total) rectally daily as needed for moderate constipation. 12 suppository 0  . Cholecalciferol (VITAMIN D3) 2000 units capsule Take 1,000 Units by mouth daily.     . clotrimazole (LOTRIMIN) 1 % cream Apply topically 2 (two) times daily. 30 g 0  . dextromethorphan 15 MG/5ML syrup Take 10 mLs by mouth 4 (four) times daily as needed for cough.    . diclofenac Sodium (VOLTAREN) 1 % GEL Apply topically 4 (four) times daily.    . DULoxetine (CYMBALTA) 60 MG capsule Take 60 mg by mouth daily.     Marland Kitchen guaiFENesin-dextromethorphan (ROBITUSSIN DM) 100-10 MG/5ML syrup Take 5 mLs by mouth every 4 (four) hours as needed for cough. 118 mL 0  . ipratropium-albuterol (DUONEB) 0.5-2.5 (3) MG/3ML SOLN Take 3 mLs by nebulization every 4 (four) hours as needed. 360 mL 3  . lidocaine (XYLOCAINE) 2 % solution Use as directed 15 mLs in the mouth or throat 3 (three) times daily as needed for mouth pain.  0  . lidocaine-prilocaine (EMLA) cream Apply cream 1 hour before chemotherapy treatment and place small peive of saran wrap over cream to protect clothing 30 g 3  . magic mouthwash SOLN Take 15 mLs by mouth 3 (three) times daily.  0  . metoprolol tartrate (LOPRESSOR) 50 MG tablet Take 1 tablet (50 mg total) by mouth 2 (two) times daily.    . pantoprazole (PROTONIX) 40 MG tablet Take 1 tablet (40 mg total) by mouth daily. 30 tablet 2  . polyethylene glycol (MIRALAX / GLYCOLAX) 17 g packet Take 17 g by mouth 2 (two) times daily. 14 each 0  . potassium chloride SA (KLOR-CON) 10 MEQ tablet Take 2 tablets (20 mEq total) by mouth daily. 30 tablet 0  . atorvastatin (LIPITOR) 80 MG tablet Take 80 mg by mouth every evening.     Marland Kitchen b complex vitamins tablet Take 1 tablet by mouth daily.     Blossom Hoops Iron (IRON CHEWS PEDIATRIC PO) Take 5 mg by mouth daily.    . Omega-3 Fatty Acids (FISH OIL PO) Take 1 tablet by mouth daily.    . ondansetron (ZOFRAN ODT) 4 MG disintegrating tablet Take 1 tablet (4 mg total) by mouth every 8 (eight) hours as needed for nausea or vomiting. (Patient not taking: Reported on 01/21/2020) 30 tablet 1  . vitamin E 400 UNIT capsule Take 400 Units by mouth daily.      No current facility-administered medications for this visit.   Facility-Administered Medications Ordered in Other Visits  Medication Dose Route Frequency Provider Last Rate Last Admin  . heparin lock flush 100  unit/mL  500 Units Intravenous Once Jamesetta Greenhalgh C, MD      . heparin lock flush 100 unit/mL  500 Units Intravenous Once Fayez Sturgell C, MD      . sodium chloride flush (NS) 0.9 % injection 10 mL  10 mL Intravenous PRN Nolon Stalls C, MD   10 mL at 04/05/17 1034  . sodium chloride flush (NS) 0.9 % injection 10 mL  10 mL Intravenous PRN Lequita Asal, MD   10 mL at 02/27/19 1033  . sodium chloride flush (NS) 0.9 % injection 10 mL  10 mL Intravenous PRN Lequita Asal, MD   10 mL at 01/29/20 3419    Review of Systems  Constitutional: Positive for malaise/fatigue. Negative for chills, diaphoresis, fever and weight loss (up 1 lb).       Doing "ok".  Low energy.  HENT: Positive for hearing loss (deaf). Negative for congestion, ear discharge, ear pain, nosebleeds, sinus pain, sore throat and tinnitus.   Eyes: Negative for blurred vision and double vision.  Respiratory: Positive for shortness of breath (on exertion). Negative for cough, hemoptysis and sputum production.        On 2 liters/min of oxygen.  Cardiovascular: Negative for chest pain, palpitations and leg swelling.  Gastrointestinal: Positive for constipation. Negative for abdominal pain, blood in stool, diarrhea, heartburn, melena, nausea (on zofran as needed) and vomiting.       No appetite. Not  eating or drinking a lot.  Genitourinary: Negative for dysuria, frequency, hematuria and urgency.  Musculoskeletal: Negative for back pain, falls, joint pain, myalgias and neck pain.  Skin: Negative for itching and rash.  Neurological: Negative for dizziness, sensory change (baseline neuropathy unchanged; left hand 2nd and 3rd digits. neuropathy in sole of feet unchanged), weakness and headaches.  Endo/Heme/Allergies: Negative for environmental allergies and polydipsia. Does not bruise/bleed easily.  Psychiatric/Behavioral: Positive for depression. Negative for memory loss. The patient is not nervous/anxious and does not have insomnia.   All other systems reviewed and are negative.  Performance status (ECOG):  3  Vitals Blood pressure 124/74, pulse (!) 103, temperature (!) 97.2 F (36.2 C), temperature source Tympanic, resp. rate 18, height _0  (1.575 m), weight 158 lb (71.7 kg), SpO2 96 %.   Physical Exam Vitals and nursing note reviewed.  Constitutional:      General: She is not in acute distress.    Appearance: Normal appearance. She is well-developed. She is not diaphoretic.     Interventions: Face mask in place.     Comments: Patient is sitting comfortably in the exam room in no acute distress.  She is examined in wheelchair.  HENT:     Head: Normocephalic and atraumatic.     Comments: Short gray hair.    Nose:     Comments: [place] in place.    Mouth/Throat:     Mouth: Mucous membranes are moist. No oral lesions.     Pharynx: Oropharynx is clear. No oropharyngeal exudate.  Eyes:     General: No scleral icterus.    Extraocular Movements: Extraocular movements intact.     Conjunctiva/sclera: Conjunctivae normal.     Pupils: Pupils are equal, round, and reactive to light.     Comments: Hazel eyes.  Neck:     Vascular: No JVD.  Cardiovascular:     Rate and Rhythm: Normal rate and regular rhythm.     Pulses: Normal pulses.     Heart sounds: Normal heart sounds. No murmur heard.  No friction rub. No gallop.   Pulmonary:     Effort: Pulmonary effort is normal. No respiratory distress.     Breath sounds: No wheezing, rhonchi or rales.     Comments: Decreased breath sounds left mid and lower lung fields (chronic). Chest:     Chest wall: No tenderness.  Abdominal:     General: Bowel sounds are normal. There is no distension.     Palpations: Abdomen is soft. There is no mass.     Tenderness: There is no abdominal tenderness. There is no guarding or rebound.  Musculoskeletal:        General: No swelling or tenderness. Normal range of motion.     Cervical back: Normal range of motion and neck supple.     Left knee: No swelling.  Lymphadenopathy:     Head:     Right side of head: No preauricular, posterior auricular or occipital adenopathy.     Left side of head: No preauricular, posterior auricular or occipital adenopathy.     Cervical: No cervical adenopathy.     Upper Body:     Right upper body: No supraclavicular or axillary adenopathy.     Left upper body: No supraclavicular or axillary adenopathy.     Lower Body: No right inguinal adenopathy. No left inguinal adenopathy.  Skin:    General: Skin is warm and dry.     Coloration: Skin is not pale.     Findings: No bruising, erythema, lesion or rash.  Neurological:     Mental Status: She is alert and oriented to person, place, and time. Mental status is at baseline.  Psychiatric:        Behavior: Behavior normal.        Thought Content: Thought content normal.        Judgment: Judgment normal.    Imaging studies: 03/03/2016:  PET scanrevealed a hypermetabolic left lower lobe mass, bilateral pleural and pulmonary parenchymal nodular metastasis, a large pleural lesion invading the anterior left chest wall. There was activity in the superior left ocular orbit. There was a large lesion centered in the right iliac wing with soft tissue extension. There were hypermetabolic right external iliac lymph  nodes 01/29/2017:  PET scanrevealed interval progression of hypermetabolic nodules in the right upper (13 mm compared to 9 mm; SUV 10) and left lower lobes (26 mm compared to 22 mm; SUV 7.5). There was new hypermetabolic focus of activity along the anterior left pleura although no underlying pleural or lung mass was evident. 06/06/2019:  Chest, abdomen, and pelvis CTrevealed an interval mass-like opacity in the superior aspect of the left parahilar lungwith an interval more prominent mass-like component involving the pleura laterally at the level with slightly more prominent extension into the left hila region, suspicious for recurrent or progressive lung cancer. There was mediastinal and bilateral hilar adenopathyc/wmetastatic adenopathy or involvement with the patient's B-cell lymphoma. There was no evidence of metastatic disease or lymphoma involving the abdomen or pelvis. There was significantly improved ground-glass opacities in the right lung, minimal left pleural fluid(improved), andastable large hiatal hernia.There was stable enlarged thyroid gland containing heterogeneous nodules with substernal extension on the left.There was stable to slightly decreased size of the previously demonstrated right adrenal nodule and no significant change in a small, poorly visualized left adrenal nodule. 06/17/2019:  PET scanrevealed a known left parahilar lung mass markedly hypermetabolic with associated hypermetabolic lymphadenopathy in both hilar regions, the mediastinum, andthe right thoracic inlet. There was hypermetabolism in the  right submandibular gland, indeterminate. There was no hypermetabolic metastatic disease evident in the abdomen or pelvis. There was a stable bilateral adrenal adenomas since 01/29/2018. There were tiny left pleural effusion and a large hiatal hernia.There was a segment of transverse colon extendingup into the hiatal hernia without obstructive features.  12/02/2019:   Chest, abdomen and pelvis CT revealed interval resolution of mediastinal and hilar adenopathy c/w response to therapy. 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 There was increased moderate-sized dependent left pleural effusion with increased left lower lobe compressive atelectasis. There was a new 6 mm nodule in the superior segment of the right lower lobe, nonspecific, but potentially a metastasis. Attention on follow-up was recommended.  There was no evidence of metastatic disease in the abdomen or pelvis.  There was a moderate-sized hiatal hernia and bilateral adrenal adenomas.    Infusion on 01/29/2020  Component Date Value Ref Range Status  . Magnesium 01/29/2020 1.6* 1.7 - 2.4 mg/dL Final   Performed at [facility], [address], [place], [place] [address]  . Sodium 01/29/2020 137  135 - 145 mmol/L Final  . Potassium 01/29/2020 3.8  3.5 - 5.1 mmol/L Final  . Chloride 01/29/2020 104  98 - 111 mmol/L Final  . CO2 01/29/2020 24  22 - 32 mmol/L Final  . Glucose, Bld 01/29/2020 140* 70 - 99 mg/dL Final   Glucose reference range applies only to samples taken after fasting for at least 8 hours.  . BUN 01/29/2020 15  8 - 23 mg/dL Final  . Creatinine, Ser 01/29/2020 0.91  0.44 - 1.00 mg/dL Final  . Calcium 92/64/9869 8.2* 8.9 - 10.3 mg/dL Final  . Total Protein 01/29/2020 6.0* 6.5 - 8.1 g/dL Final  . Albumin 22/94/0823 2.0* 3.5 - 5.0 g/dL Final  . AST 66/98/0287 20  15 - 41 U/L Final  . ALT 01/29/2020 21  0 - 44 U/L Final  . Alkaline Phosphatase 01/29/2020 127* 38 - 126 U/L Final  . Total Bilirubin 01/29/2020 0.7  0.3 - 1.2 mg/dL Final  . GFR calc non Af Amer 01/29/2020 >60  >60 mL/min Final  . GFR calc Af Amer 01/29/2020 >60  >60 mL/min Final  . Anion gap 01/29/2020 9  5 - 15 Final   Performed at [facility], [address] [place], [place] [address]  .  WBC 01/29/2020 7.0  4.0 - 10.5 K/uL Final  . RBC 01/29/2020 2.98* 3.87 - 5.11 MIL/uL Final  . Hemoglobin 01/29/2020 8.9* 12.0 - 15.0 g/dL Final  . HCT 65/78/4292 27.8* 36 - 46 % Final  . MCV 01/29/2020 93.3  80.0 - 100.0 fL Final  . MCH 01/29/2020 29.9  26.0 - 34.0 pg Final  . MCHC 01/29/2020 32.0  30.0 - 36.0 g/dL Final  . RDW 77/26/2420 18.5* 11.5 - 15.5 % Final  . Platelets 01/29/2020 410* 150 - 400 K/uL Final  . nRBC 01/29/2020 0.0  0.0 - 0.2 % Final  . Neutrophils Relative % 01/29/2020 77  % Final  . Neutro Abs 01/29/2020 5.4  1.7 - 7.7 K/uL Final  . Lymphocytes Relative 01/29/2020 16  % Final  . Lymphs Abs 01/29/2020 1.1  0.7 - 4.0 K/uL Final  . Monocytes Relative 01/29/2020 5  % Final  . Monocytes Absolute 01/29/2020 0.3  0 - 1 K/uL Final  . Eosinophils Relative 01/29/2020 0  % Final  . Eosinophils Absolute 01/29/2020 0.0  0 -  0 K/uL Final  . Basophils Relative 01/29/2020 1  % Final  . Basophils Absolute 01/29/2020 0.1  0 - 0 K/uL Final  . Immature Granulocytes 01/29/2020 1  % Final  . Abs Immature Granulocytes 01/29/2020 0.09* 0.00 - 0.07 K/uL Final   Performed at [facility], [address], [place], [place] [address]    Assessment:  Toni Griffith is a 79 y.o. female withstage IVB B-cell lymphoma, unclassifiable, with features intermediate between diffuse large B-cell lymphoma and classical Hodgkin's lymphoma ("gray zone" lymphoma). She underwent right iliac wing bone/bone marrow biopsy on 03/08/2016.  PET scanon 03/03/2016 revealed a hypermetabolic left lower lobe mass, bilateral pleural and pulmonary parenchymal nodular metastasis, a large pleural lesion invading the anterior left chest wall. There was activity in the superior left ocular orbit. There was a large lesion centered in the right iliac wing with soft tissue extension. There were hypermetabolic right external iliac lymph nodes  [place] 04/04/2016 revealed an EF of 55-60%. Hepatitis B  and C testing was negative on 03/30/2016. G6PD assay was normal. She declined LP with MTX prophylaxis.  She received 6 cycles of mini-RCHOP(04/07/2016 - 07/21/2016). She has had some transient fingertip numbness.  She received radiationto the right iliac wing, from 10/11/2016 - 11/01/2016. She received 3000 cGy over 3 weeks.  She has squamous cell lung cancer. CT guided left lower lobe nodule biopsyon 02/19/2017 confirmed squamous cell carcinoma. She underwent SBRT08/27/2018 - 04/09/2017. She received 5000 cGy in 5 fractions to the left lower lobe lesion. She underwent SBRTto a RUL lesion from 05/23/2017 - 06/18/2017.  CT guided left upper lobe nodule biopsyon 02/20/2018 revealedsquamous cell lung cancer. She received SBRTfrom 04/09/2018 - 04/15/2018. She received 6000 cGy in 5 fractions.   Foundation Oneon the lung biopsy on 03/15/2018 revealed MS-stable, tumor mutational burden 5 Muts/Mb, AKT2 amplification, CCNE1 amplification, CREBBP R1446L, KDR amplification, KIT amplification, PDGFRA amplification, PTEN loss exons 2-4, and PX10 splice site 626+9S>W. Marland Kitchen There were no alterations in EGFR, KRAS, RET, ALK, MET, ERBB2, BRAF, ROS1. PDL-1IHC analysis revealed a TPS score of 50%.  Bronchoscopyon 05/30/2018 revealed no endobronchial lesions. The 2 cm pre-carinal lymph node was biopsied. Pathology revealed metastatic non-small cell carcinoma, favor squamous cell carcinoma.  CT guided right paraspinal lesion biopsyon 10/16/2017 was compatible with involvement by the patient's previously diagnosed lymphoma. The case was referred to [facility] hematopathology. The neoplastic cells were largely CD30 negative. Repeat CD30 immunohistochemistry faintly stained >10% of the large atypical cells. CD20 stained the scattered large atypical cells and few small cells.   She received 3 cycles of brentuximab vedotin(11/23/2017 - 01/11/2018).Her lymphoma progressed.  Shereceived14cycles  of pembrolizumab(06/14/2018 -05/21/2019).   Shereceived4000cGyto the left hilar regionfrom12/14/2020 -08/15/2019.  She received 4 cycles ofpembolizumab, carboplatin and Abraxane (08/29/2019 - 11/05/2019). She experienced tinnitus after her first day of chemotherapy. Tinnitus was minor with cycle #2.  She received day 1 of cycle #1 gemcitabine (01/01/2020).  She declined further gemcitabine.  She is day 8 of cycle #1 navelbine (01/22/2020).  She tolerated treatment well except for fatigue.  Chest, abdomen and pelvis CT on 12/02/2019 revealed interval resolution of mediastinal and hilar adenopathy c/w response to therapy. 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 There was increased moderate-sized dependent left pleural effusion with increased left lower lobe compressive atelectasis. There was a new 6 mm nodule in the superior segment of the right lower lobe, nonspecific, but potentially a metastasis. Attention on follow-up was  recommended.  There was no evidence of metastatic disease in the abdomen or pelvis.  There was a moderate-sized hiatal hernia and bilateral adrenal adenomas.   Ultrasound guided left thoracentesis on 12/05/2019 revealed non-small cell carcinoma.   She hasiron deficiency anemia. Ferritinhas been followed: 14 on 05/06/2018,23 on 07/26/2018, and 59 on 11/01/2018. She denies any bleeding.  She has a grade I neuropathy. B12 and folate were normal on 12/06/2017. She has severe bilateral knee osteoarthritis. She has received steroid injections and Monovisc (hyaluronate derivatives) without improvement.   She was [facility] to [facility] from 12/09/2019-12/11/2019 with chills, diaphoresis, tachycardia, and tachypnea for r/o sepsis. She was treated with broad spectrum antibiotics (vancomycin and Cefepime). Cultures remained negative.  She was  discharged on Augmentin.  She was switched to Levaquin on 12/25/2019.  She was admitted to [facility] from 01/03/2020 - 01/09/2020 with swollen lips and mouth burning felt secondary to medication (? gemcitabine), chronic respiratory failure, pneumonia, and new onset A. fib with RVR.  She was treated with Cefepime then switched to Cefdinir, Eliquis, and metoprolol.  She was discharged to rehabilitation.  She has malignancy associated hypercalcemia.  She received Zometa on 01/14/2020.  She has atrial fibrillation and is on Eliquis.  She has NYHA class II chronic heart failure.  Echo on 01/04/2020 revealed an EF of 60-65%.  She is deafand requires an interpreter.  Symptomatically, she remains fatigued.  She requires assistance with her activities of daily living.  Exam reveals decreased breath sound 2/3 up on the left.   Plan: 1.   Labs today: CBC with diff, CMP, Mg. 2.Gray zone lymphoma Clinically, her lymphoma is controlled. Exam exam reveals no adenopathy or hepatosplenomegaly. She has received 6 cycles of mini-RCOP, 3 cycles ofbrentuximab vedotin, and 16cycles of pembrolizumab. PET scan on 06/17/2019 revealed progressive disease in the chest felt secondary toprogressive lung cancer. She is s/p6additional cycle of maintenance pembrolizumab (last 01/01/2020). May consider reinstitution of pembrolizumab if patient responding to Navelbine.  3. Clinical stage IV squamous cell lung cancer Clinically, she has an obstructive process in her left lung. Patient is s/p SBRT to left upper lobe and left lower lobe nodules. Patient is s/p 15cyclesof pembrolizumab. Patient is s/p palliative radiation to the right hilar region (completed 08/15/2019). She is s/p4cycles of carboplatin and Abraxane (08/29/2019 - 11/05/2019). Chest, abdomen and pelvis CT scan on 12/02/2019 revealed a new moderate left-sided effusion.  Thoracentesis on 12/05/2019 confirmed  non-small cell lung cancer.  No clinical trial available at [facility].  She received day 1 of cycle #1 gemcitabine.  Course was complicated  She received day 1 of cycle #1 navelbine on 01/22/2020.   She tolerated chemotherapy fairly well with only slight fatigue.   Counts remain adequate.  Her daughter is concerned about her general decline in health.  Decision made to postpone chemotherapy today and reevaluate next week. 4.Recurrent left sided pleural effusion  Patient is s/p thoracentesis on 12/05/2019 which confirmed non-small cell lung cancer.  Plan for Pleur-X catheter if fluid reaccumulates.  Stent would not be benificial as obstruction is too distal.  She is followed by Dr. Patsey Berthold.  RN to call Dr Patsey Berthold re: portable oxygen tank. 5.   Post obstructive process  Clinically, he remains stable but debilitated.  She has completed several rounds of antibiotics.    She has poor respiratory reserve.  She is on oxygen at night. 6.   Malignancy associated hypercalcemia  Calcium 10.5 (corrected 11.86) on 2020/02/16.  Calcium 8.2 (corrected  9.9) today.  She received Zometa on 02-18-20.  Continue to monitor. 7.   Chemotherapy-induced anemia Hematocrit 27.8.  Hemoglobin 8.9.  MCV 93.3. Preauth Retacrit. 8. Cancer related pain  Pain remains well managed.   9.   Code status  Code status is FULL. 10.   RTC on 02/04/2020 for MD assessment, labs (CBC with diff, CMP, Mg, hold tube), and day 8 of cycle #1 navelbine.  I discussed the assessment and treatment plan with the patient.  The patient was provided an opportunity to ask questions and all were answered.  The patient agreed with the plan and demonstrated an understanding of the instructions.  The patient was advised to call back if the symptoms worsen or if the condition fails to improve as anticipated.  I provided 17 minutes of face-to-face time during this encounter and > 50% was spent counseling as documented under my  assessment and plan.     Lequita Asal, MD, PhD    01/29/2020, 10:04 AM  I, De Burrs, am acting as Education administrator for [employer]. Mike Gip, MD, PhD.  I, Polk Minor C. Mike Gip, MD, have reviewed the above documentation for accuracy and completeness, and I agree with the above.

## 2020-01-29 ENCOUNTER — Inpatient Hospital Stay: Payer: Medicare HMO

## 2020-01-29 ENCOUNTER — Inpatient Hospital Stay: Payer: Medicare HMO | Admitting: Hematology and Oncology

## 2020-01-29 ENCOUNTER — Other Ambulatory Visit: Payer: Self-pay

## 2020-01-29 ENCOUNTER — Encounter: Payer: Self-pay | Admitting: Hematology and Oncology

## 2020-01-29 ENCOUNTER — Telehealth: Payer: Self-pay | Admitting: Pulmonary Disease

## 2020-01-29 ENCOUNTER — Inpatient Hospital Stay: Payer: Medicare HMO | Attending: Hematology and Oncology

## 2020-01-29 ENCOUNTER — Telehealth: Payer: Self-pay

## 2020-01-29 ENCOUNTER — Other Ambulatory Visit: Payer: Medicare HMO | Admitting: Primary Care

## 2020-01-29 VITALS — BP 124/74 | HR 103 | Temp 97.2°F | Resp 18 | Ht 62.0 in | Wt 158.0 lb

## 2020-01-29 DIAGNOSIS — D3502 Benign neoplasm of left adrenal gland: Secondary | ICD-10-CM | POA: Insufficient documentation

## 2020-01-29 DIAGNOSIS — Z87891 Personal history of nicotine dependence: Secondary | ICD-10-CM | POA: Insufficient documentation

## 2020-01-29 DIAGNOSIS — Z5111 Encounter for antineoplastic chemotherapy: Secondary | ICD-10-CM

## 2020-01-29 DIAGNOSIS — D3501 Benign neoplasm of right adrenal gland: Secondary | ICD-10-CM | POA: Diagnosis not present

## 2020-01-29 DIAGNOSIS — C8330 Diffuse large B-cell lymphoma, unspecified site: Secondary | ICD-10-CM | POA: Insufficient documentation

## 2020-01-29 DIAGNOSIS — K5900 Constipation, unspecified: Secondary | ICD-10-CM | POA: Diagnosis not present

## 2020-01-29 DIAGNOSIS — C3490 Malignant neoplasm of unspecified part of unspecified bronchus or lung: Secondary | ICD-10-CM

## 2020-01-29 DIAGNOSIS — G893 Neoplasm related pain (acute) (chronic): Secondary | ICD-10-CM | POA: Insufficient documentation

## 2020-01-29 DIAGNOSIS — I4891 Unspecified atrial fibrillation: Secondary | ICD-10-CM | POA: Insufficient documentation

## 2020-01-29 DIAGNOSIS — G63 Polyneuropathy in diseases classified elsewhere: Secondary | ICD-10-CM

## 2020-01-29 DIAGNOSIS — R5381 Other malaise: Secondary | ICD-10-CM | POA: Insufficient documentation

## 2020-01-29 DIAGNOSIS — C3492 Malignant neoplasm of unspecified part of left bronchus or lung: Secondary | ICD-10-CM

## 2020-01-29 DIAGNOSIS — I110 Hypertensive heart disease with heart failure: Secondary | ICD-10-CM | POA: Insufficient documentation

## 2020-01-29 DIAGNOSIS — D649 Anemia, unspecified: Secondary | ICD-10-CM

## 2020-01-29 DIAGNOSIS — T451X5A Adverse effect of antineoplastic and immunosuppressive drugs, initial encounter: Secondary | ICD-10-CM | POA: Insufficient documentation

## 2020-01-29 DIAGNOSIS — E876 Hypokalemia: Secondary | ICD-10-CM

## 2020-01-29 DIAGNOSIS — J90 Pleural effusion, not elsewhere classified: Secondary | ICD-10-CM | POA: Insufficient documentation

## 2020-01-29 DIAGNOSIS — M170 Bilateral primary osteoarthritis of knee: Secondary | ICD-10-CM | POA: Insufficient documentation

## 2020-01-29 DIAGNOSIS — Z7901 Long term (current) use of anticoagulants: Secondary | ICD-10-CM | POA: Insufficient documentation

## 2020-01-29 DIAGNOSIS — J9610 Chronic respiratory failure, unspecified whether with hypoxia or hypercapnia: Secondary | ICD-10-CM | POA: Insufficient documentation

## 2020-01-29 DIAGNOSIS — C801 Malignant (primary) neoplasm, unspecified: Secondary | ICD-10-CM

## 2020-01-29 DIAGNOSIS — Z801 Family history of malignant neoplasm of trachea, bronchus and lung: Secondary | ICD-10-CM | POA: Diagnosis not present

## 2020-01-29 DIAGNOSIS — I509 Heart failure, unspecified: Secondary | ICD-10-CM | POA: Insufficient documentation

## 2020-01-29 DIAGNOSIS — Z515 Encounter for palliative care: Secondary | ICD-10-CM

## 2020-01-29 DIAGNOSIS — Z79899 Other long term (current) drug therapy: Secondary | ICD-10-CM | POA: Diagnosis not present

## 2020-01-29 DIAGNOSIS — C8518 Unspecified B-cell lymphoma, lymph nodes of multiple sites: Secondary | ICD-10-CM

## 2020-01-29 DIAGNOSIS — Z833 Family history of diabetes mellitus: Secondary | ICD-10-CM | POA: Insufficient documentation

## 2020-01-29 DIAGNOSIS — D6481 Anemia due to antineoplastic chemotherapy: Secondary | ICD-10-CM | POA: Diagnosis not present

## 2020-01-29 DIAGNOSIS — Z806 Family history of leukemia: Secondary | ICD-10-CM | POA: Insufficient documentation

## 2020-01-29 DIAGNOSIS — D509 Iron deficiency anemia, unspecified: Secondary | ICD-10-CM

## 2020-01-29 DIAGNOSIS — J189 Pneumonia, unspecified organism: Secondary | ICD-10-CM | POA: Diagnosis not present

## 2020-01-29 DIAGNOSIS — G629 Polyneuropathy, unspecified: Secondary | ICD-10-CM | POA: Diagnosis not present

## 2020-01-29 DIAGNOSIS — R0602 Shortness of breath: Secondary | ICD-10-CM | POA: Diagnosis not present

## 2020-01-29 DIAGNOSIS — Z820 Family history of epilepsy and other diseases of the nervous system: Secondary | ICD-10-CM | POA: Insufficient documentation

## 2020-01-29 DIAGNOSIS — H9190 Unspecified hearing loss, unspecified ear: Secondary | ICD-10-CM | POA: Diagnosis not present

## 2020-01-29 DIAGNOSIS — K449 Diaphragmatic hernia without obstruction or gangrene: Secondary | ICD-10-CM | POA: Diagnosis not present

## 2020-01-29 DIAGNOSIS — Z7189 Other specified counseling: Secondary | ICD-10-CM

## 2020-01-29 LAB — COMPREHENSIVE METABOLIC PANEL
ALT: 21 U/L (ref 0–44)
AST: 20 U/L (ref 15–41)
Albumin: 2 g/dL — ABNORMAL LOW (ref 3.5–5.0)
Alkaline Phosphatase: 127 U/L — ABNORMAL HIGH (ref 38–126)
Anion gap: 9 (ref 5–15)
BUN: 15 mg/dL (ref 8–23)
CO2: 24 mmol/L (ref 22–32)
Calcium: 8.2 mg/dL — ABNORMAL LOW (ref 8.9–10.3)
Chloride: 104 mmol/L (ref 98–111)
Creatinine, Ser: 0.91 mg/dL (ref 0.44–1.00)
GFR calc Af Amer: >60 mL/min (ref >60)
GFR calc non Af Amer: >60 mL/min (ref >60)
Glucose, Bld: 140 mg/dL — ABNORMAL HIGH (ref 70–99)
Potassium: 3.8 mmol/L (ref 3.5–5.1)
Sodium: 137 mmol/L (ref 135–145)
Total Bilirubin: 0.7 mg/dL (ref 0.3–1.2)
Total Protein: 6 g/dL — ABNORMAL LOW (ref 6.5–8.1)

## 2020-01-29 LAB — CBC WITH DIFFERENTIAL/PLATELET
Abs Immature Granulocytes: 0.09 10*3/uL — ABNORMAL HIGH (ref 0.00–0.07)
Basophils Absolute: 0.1 10*3/uL (ref 0.0–0.1)
Basophils Relative: 1 %
Eosinophils Absolute: 0 10*3/uL (ref 0.0–0.5)
Eosinophils Relative: 0 %
HCT: 27.8 % — ABNORMAL LOW (ref 36.0–46.0)
Hemoglobin: 8.9 g/dL — ABNORMAL LOW (ref 12.0–15.0)
Immature Granulocytes: 1 %
Lymphocytes Relative: 16 %
Lymphs Abs: 1.1 10*3/uL (ref 0.7–4.0)
MCH: 29.9 pg (ref 26.0–34.0)
MCHC: 32 g/dL (ref 30.0–36.0)
MCV: 93.3 fL (ref 80.0–100.0)
Monocytes Absolute: 0.3 10*3/uL (ref 0.1–1.0)
Monocytes Relative: 5 %
Neutro Abs: 5.4 10*3/uL (ref 1.7–7.7)
Neutrophils Relative %: 77 %
Platelets: 410 10*3/uL — ABNORMAL HIGH (ref 150–400)
RBC: 2.98 MIL/uL — ABNORMAL LOW (ref 3.87–5.11)
RDW: 18.5 % — ABNORMAL HIGH (ref 11.5–15.5)
WBC: 7 10*3/uL (ref 4.0–10.5)
nRBC: 0 % (ref 0.0–0.2)

## 2020-01-29 LAB — MAGNESIUM: Magnesium: 1.6 mg/dL — ABNORMAL LOW (ref 1.7–2.4)

## 2020-01-29 MED ORDER — HEPARIN SOD (PORK) LOCK FLUSH 100 UNIT/ML IV SOLN
500.0000 [IU] | Freq: Once | INTRAVENOUS | Status: AC
  Administered 2020-01-29: 500 [IU] via INTRAVENOUS
  Filled 2020-01-29: qty 5

## 2020-01-29 MED ORDER — SODIUM CHLORIDE 0.9% FLUSH
10.0000 mL | INTRAVENOUS | Status: DC | PRN
  Administered 2020-01-29: 10 mL via INTRAVENOUS
  Filled 2020-01-29: qty 10

## 2020-01-29 NOTE — Telephone Encounter (Signed)
Spoke to pt's daughter, [name] ([place]).  [name] stated that PT at the rehab facility that pt attended is working on an order for POC, as pt was on oxygen 24/7 while in rehab. I have advised [name] to contact our office if PT is unable to order POC.  Nothing further is needed.

## 2020-01-29 NOTE — Telephone Encounter (Signed)
Spoke with pulmonary about ordering patient portable oxygen tank. They said they will reach back to patient.

## 2020-01-29 NOTE — Telephone Encounter (Signed)
Lm for pt's daughter, [name] (DPR). *please see 01/27/2020 phone note.

## 2020-01-29 NOTE — Progress Notes (Signed)
The patient daughter states she is not eatten at all and drinking only 8-16 oz

## 2020-01-29 NOTE — Progress Notes (Signed)
Toni Griffith Consult Note Telephone: [phone]  Fax: [phone]  PATIENT NAME: Toni Griffith [address] [place] Toni Griffith [address] [phone] (home)  DOB: 1941-02-17 MRN: [id number]  PRIMARY CARE PROVIDER:    [facility] [address] [place] [address] [phone]  REFERRING PROVIDER:   Irean Hong, NP [place],  [place] [address]  RESPONSIBLE PARTY:   Extended Emergency Contact Information Primary Emergency Contact: Toni Griffith Address: [address]          [place],  [address] Toni Griffith of [place] Phone: [phone] Mobile Phone: [phone] Relation: Daughter Secondary Emergency Contact: Toni Griffith [place] Phone: [phone] Work Phone: [phone] Relation: Son   I met met with Toni Griffith and her daughter today at her son's home. Patient is deaf and daughter signed for our meeting.  ASSESSMENT AND RECOMMENDATIONS:   1. Advance Care Planning/Goals of Care: Goals include to maximize quality of life and symptom management. Our advance care planning conversation included a discussion about:     The value and importance of advance care planning   Experiences with loved ones who have been seriously ill or have died   Exploration of personal, cultural or spiritual beliefs that might influence medical decisions   Exploration of goals of care in the event of a sudden injury or illness   Identification  of a healthcare agent   Review   advance directive document. MOST given, will review before completion with adult children. We discussed goals of care. She states she wants everything done she wants to pursue treatment and feels that it will give her more time. We discuss the advanced nature of her illness and how any treatment will also bring side effects maybe ones that were intolerable. She said she would want to try.She  will discuss more in depth with her son and daughter.  2. Symptom Management:   Dyspnea: Extreme DOE. Has oxygen in home but no travel oxygen. She also needs some help with her oxygen and  DME and is talking to the nursing home who has agreed to help with obtaining her portable oxygen. We discussed morphine as possible modality for air hunger treatment. She was open to this.    Treatment decision plans: Toni Griffith has returned to her son's home from the nursing home, where she is saying to take outpatient chemo treatments. This morning she went to chemo but was not given a treatment. They are going to pursue red blood cell stimulator and reschedule. She states that her son and daughter are taking care of her now and she plans to go back-and-forth between her home in [place] and her sons home.   Constipation: Recommended bowel program with  miralax and senna. She describes being constipated most of the time.   Safety: Discussed medical alert for button call system.  Has orders for PT, OT.   3. Family /Caregiver/Community Supports: Lives with son currently in [place] but has a home in [place].We discussed home health CNA which should be part of the care plan.  4. Cognitive / Functional decline: Alert and oriented . Deaf, communicates with signing and some lip reading. Able to ambulate with extreme and debilitating DOE and weakness, using rollator. She is a fall risk.  5. Follow up Palliative Care Visit: Palliative care will continue to follow for goals of care clarification and symptom management. Return 2-3 weeks or  prn.  I spent 95 minutes providing this consultation,  from 1400 to 1535. More than 50% of the time in this consultation was spent coordinating communication.   HISTORY OF PRESENT ILLNESS:  Toni Griffith is a 79 y.o. 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 She was in hospital for lip edema which was likely  from chemo. Found Pneumonia and afib at that time. Now negotiating a chemo course of treatment. Palliative Care was asked to follow this patient by consultation request of Toni Griffith, Toni Boys, NP  to help address advance care planning and goals of care. This is a follow up visit.  CODE STATUS: FULL CODE  PPS: 40%  HOSPICE ELIGIBILITY/DIAGNOSIS: TBD  PAST MEDICAL HISTORY:  Past Medical History:  Diagnosis Date  . Arthritis   . Cancer [employer] [facility] [employer] [facility])    lymphoma-right hip  . CHF (congestive heart failure) ([place])   . COPD (chronic obstructive pulmonary disease) ([place])   . Deaf   . GERD (gastroesophageal reflux disease)   . Hypertension    NOT ON MED AT PRESENT  . Squamous cell lung cancer ([place])     SOCIAL HX:  Social History   Tobacco Use  . Smoking status: Former Smoker    Packs/day: 2.00    Years: 40.00    Pack years: 80.00    Types: Cigarettes    Quit date: 11/21/2015    Years since quitting: 4.1  . Smokeless tobacco: Never Used  Substance Use Topics  . Alcohol use: Yes    Comment: rare    ALLERGIES: No Known Allergies   PERTINENT MEDICATIONS:  Outpatient Encounter Medications as of 01/29/2020  Medication Sig  . acetaminophen (TYLENOL) 500 MG tablet Take 500 mg by mouth every 6 (six) hours as needed for moderate pain.   Marland Kitchen allopurinol (ZYLOPRIM) 300 MG tablet TAKE 1 TABLET BY MOUTH EVERY DAY (Patient taking differently: Take 300 mg by mouth daily. )  . alum & mag hydroxide-simeth (MAALOX/MYLANTA) 200-200-20 MG/5ML suspension Take 30 mLs by mouth every 4 (four) hours as needed for indigestion or heartburn.  Marland Kitchen apixaban (ELIQUIS) 5 MG TABS tablet Take 1 tablet (5 mg total) by mouth 2 (two) times daily.  Marland Kitchen aspirin EC 81 MG tablet Take 81 mg by mouth daily.  Marland Kitchen atorvastatin (LIPITOR) 80 MG tablet Take 80 mg by mouth every evening.   Marland Kitchen b complex vitamins tablet Take 1 tablet by mouth daily.  . bisacodyl (DULCOLAX) 10 MG suppository Place 1 suppository (10 mg total) rectally daily as needed  for moderate constipation.  Toni Griffith Iron (IRON CHEWS PEDIATRIC PO) Take 5 mg by mouth daily.  . Cholecalciferol (VITAMIN D3) 2000 units capsule Take 1,000 Units by mouth daily.   . clotrimazole (LOTRIMIN) 1 % cream Apply topically 2 (two) times daily.  Marland Kitchen dextromethorphan 15 MG/5ML syrup Take 10 mLs by mouth 4 (four) times daily as needed for cough.  . diclofenac Sodium (VOLTAREN) 1 % GEL Apply topically 4 (four) times daily.  . DULoxetine (CYMBALTA) 60 MG capsule Take 60 mg by mouth daily.   Marland Kitchen guaiFENesin-dextromethorphan (ROBITUSSIN DM) 100-10 MG/5ML syrup Take 5 mLs by mouth every 4 (four) hours as needed for cough.  Marland Kitchen ipratropium-albuterol (DUONEB) 0.5-2.5 (3) MG/3ML SOLN Take 3 mLs by nebulization every 4 (four) hours as needed.  . lidocaine (XYLOCAINE) 2 % solution Use as directed 15 mLs in the mouth or throat 3 (three) times daily as needed for mouth pain.  Marland Kitchen  lidocaine-prilocaine (EMLA) cream Apply cream 1 hour before chemotherapy treatment and place small peive of saran wrap over cream to protect clothing  . magic mouthwash SOLN Take 15 mLs by mouth 3 (three) times daily.  . metoprolol tartrate (LOPRESSOR) 50 MG tablet Take 1 tablet (50 mg total) by mouth 2 (two) times daily.  . Omega-3 Fatty Acids (FISH OIL PO) Take 1 tablet by mouth daily.  . ondansetron (ZOFRAN ODT) 4 MG disintegrating tablet Take 1 tablet (4 mg total) by mouth every 8 (eight) hours as needed for nausea or vomiting. (Patient not taking: Reported on 01/21/2020)  . pantoprazole (PROTONIX) 40 MG tablet Take 1 tablet (40 mg total) by mouth daily.  . polyethylene glycol (MIRALAX / GLYCOLAX) 17 g packet Take 17 g by mouth 2 (two) times daily.  . potassium chloride SA (KLOR-CON) 10 MEQ tablet Take 2 tablets (20 mEq total) by mouth daily.  . vitamin E 400 UNIT capsule Take 400 Units by mouth daily.    Facility-Administered Encounter Medications as of 01/29/2020  Medication  . heparin lock flush 100 unit/mL  . [COMPLETED]  heparin lock flush 100 unit/mL  . sodium chloride flush (NS) 0.9 % injection 10 mL  . sodium chloride flush (NS) 0.9 % injection 10 mL  . sodium chloride flush (NS) 0.9 % injection 10 mL    PHYSICAL EXAM / ROS:   Current and past weights: 174 lbs General: NAD, frail appearing, WNWD Cardiovascular: no chest pain reported, slight LE edema  Pulmonary:  occ cough, ncreased SOB, DOE. Oyxgen in home, placed at 2 L / [place] Abdomen: appetite poor, intake minimal, endorses constipation, continent of bowel GU: denies dysuria, continent of urine MSK:  ++ joint and ROM abnormalities, ambulatory with walker and stand by Skin: no rashes or wounds reported or noted on gross exam Neurological: Weakness, denies pain, endorses fatigue, drowsiness  Jason Coop, NP [facility]  COVID-19 PATIENT SCREENING TOOL  Person answering questions: ____________[name]_______ _____   1.  Is the patient or any family member in the home showing any signs or symptoms regarding respiratory infection?               Person with Symptom- __________NA_________________  a. Fever                                                                          Yes___ No___          ___________________  b. Shortness of breath                                                    Yes___ No___          ___________________ c. Cough/congestion                                       Yes___  No___         ___________________ d. Body aches/pains  Yes___ No___        ____________________ e. Gastrointestinal symptoms (diarrhea, nausea)           Yes___ No___        ____________________  2. Within the past 14 days, has anyone living in the home had any contact with someone with or under investigation for COVID-19?    Yes___ No_X_   Person __________________

## 2020-01-30 ENCOUNTER — Other Ambulatory Visit: Payer: Self-pay | Admitting: Hematology and Oncology

## 2020-01-30 DIAGNOSIS — E871 Hypo-osmolality and hyponatremia: Secondary | ICD-10-CM

## 2020-01-30 NOTE — Telephone Encounter (Signed)
Spoke to pt's daughter, [name](DPR), who is requesting POC as pt wore oxygen 24/7 when in rehab.  Per our records, pt is only prescribed night time oxygen. I have explained the need for a qualifying walk to [name] in order to prescribe a POC.  Pt has pending appt for 02/18/2020. I have updated appt note for a reminder that a walk is needed during this visit.  [name] was okay with waiting until pending appt, as we do not have sooner available in our [place] office. [name] was not open to going to [place] for sooner appt with NP.  Nothing further is needed at this time.

## 2020-01-30 NOTE — Telephone Encounter (Signed)
°   Ref Range & Units 1 d ago  Potassium 3.5 - 5.1 mmol/L 3.8

## 2020-02-03 ENCOUNTER — Telehealth: Payer: Self-pay | Admitting: Primary Care

## 2020-02-03 NOTE — Progress Notes (Signed)
[facility]  [address], [address] [place], [place] [address] Phone: [phone]  Fax: [phone]   Clinic Day:  02/03/2020  Referring physician: Francesca Oman, DO  Chief Complaint: Toni Griffith is a 79 y.o. 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  HPI: The patient was last seen in the medical oncology clinic on 01/29/2020. At that time, she felt tired.  She was not eating or drinking much despite being home from the rehab center.  She required assistance for ADLs.  Hematocrit was 27.8, hemoglobin 8.9, MCV 93.3, platelets 410,000, WBC 7,000. Calcium was 8.2.  Albumin was 2.0.  Magnesium was 1.6.  Oral magnesium was discussed.  Her family wished to defer treatment and thus chemotherapy was held.  During the interim, she has been "pretty good."   Her daughter disagrees.  She has not been eating well because she has no appetite. She is also having trouble swallowing because food gets stuck in her throat. Her throat is sensitive to cold. Yesterday, she ate applesauce, some cereal, and part of a tomato sandwich. Her daughter has been trying to put protein powder in her cereal to give her extra calories. She has not been enjoying Boost or Ensure because they taste different. She had a little bit of diarrhea a few days ago.  The patient has also been tired and sleeping more. Her daughter reports that she sleeps 20 hours/day. Her shortness of breath has worsened since last night. She is currently on 2 liters/min oxygen. She has a wheelchair at home and sometimes uses a walker to get to the bathroom. The patient can no longer get into the shower because she has no energy.   The patient's family has been cutting down on her OTC vitamins to reduce the amounts of pills she has to take.  She requests portable oxygen.  The family has not been able to get a  portable oxygen unit for the patient. According to Dr. Patsey Berthold, the patient needed to be seen for a walking test before she could get portable oxygen. Her appointment with Dr Patsey Berthold is on 02/18/2020.  A walking test was completed in the office today. The patient used a wheelchair for support because she did not have her walker; she was only able to take 3-4 steps before her oxygen saturation decreased to 79-80%.   She had to stop walking secondary to shortness of breath.    The patient and her family are agreeable to postponing chemotherapy and proceeding with Hospice care until the patient's condition improves.   Past Medical History:  Diagnosis Date  . Arthritis   . Cancer [facility])    lymphoma-right hip  . CHF (congestive heart failure) ([place])   . COPD (chronic obstructive pulmonary disease) ([place])   . Deaf   . GERD (gastroesophageal reflux disease)   . Hypertension    NOT ON MED AT PRESENT  . Squamous cell lung cancer [facility])     Past Surgical History:  Procedure Laterality Date  . CARDIAC SURGERY  1993  . CORONARY ARTERY BYPASS GRAFT     DOUBLE  . CYST REMOVAL HAND Right   . ENDOBRONCHIAL ULTRASOUND N/A 05/30/2018   Procedure: ENDOBRONCHIAL ULTRASOUND;  Surgeon: Tyler Pita, MD;  Location: [facility] ORS;  Service: Cardiopulmonary;  Laterality: N/A;  . PERIPHERAL VASCULAR CATHETERIZATION N/A 04/06/2016   Procedure: Glori Luis  Cath Insertion;  Surgeon: Algernon Huxley, MD;  Location: [place] CV LAB;  Service: Cardiovascular;  Laterality: N/A;    Family History  Problem Relation Age of Onset  . Lung cancer Mother   . Lung cancer Father   . Diabetes Maternal Uncle   . Myasthenia gravis Maternal Grandmother   . Leukemia Maternal Grandfather     Social History:  reports that she quit smoking about 4 years ago. Her smoking use included cigarettes. She has a 80.00 pack-year smoking history. She has never used smokeless tobacco. She reports current alcohol use. She reports that she does  not use drugs.  She smoked 1 1/2 packs per day for 20 years (30 pack year smoking history) until 10/2015. She is hearing impaired (deaf). She lives in [place] with her boyfriend. Her daughter lives in [place]. Her daughter, [name]'s contact number is [phone].She has 2 cats named Pepper and Baby. The patient is accompanied by her daughter today.  Allergies: No Known Allergies  Current Medications: Current Outpatient Medications  Medication Sig Dispense Refill  . acetaminophen (TYLENOL) 500 MG tablet Take 500 mg by mouth every 6 (six) hours as needed for moderate pain.     Marland Kitchen allopurinol (ZYLOPRIM) 300 MG tablet TAKE 1 TABLET BY MOUTH EVERY DAY (Patient taking differently: Take 300 mg by mouth daily. ) 90 tablet 0  . alum & mag hydroxide-simeth (MAALOX/MYLANTA) 200-200-20 MG/5ML suspension Take 30 mLs by mouth every 4 (four) hours as needed for indigestion or heartburn. 355 mL 0  . apixaban (ELIQUIS) 5 MG TABS tablet Take 1 tablet (5 mg total) by mouth 2 (two) times daily. 60 tablet   . aspirin EC 81 MG tablet Take 81 mg by mouth daily.    Marland Kitchen atorvastatin (LIPITOR) 80 MG tablet Take 80 mg by mouth every evening.     Marland Kitchen b complex vitamins tablet Take 1 tablet by mouth daily.    . bisacodyl (DULCOLAX) 10 MG suppository Place 1 suppository (10 mg total) rectally daily as needed for moderate constipation. 12 suppository 0  . Carbonyl Iron (IRON CHEWS PEDIATRIC PO) Take 5 mg by mouth daily.    . Cholecalciferol (VITAMIN D3) 2000 units capsule Take 1,000 Units by mouth daily.     . clotrimazole (LOTRIMIN) 1 % cream Apply topically 2 (two) times daily. 30 g 0  . dextromethorphan 15 MG/5ML syrup Take 10 mLs by mouth 4 (four) times daily as needed for cough.    . diclofenac Sodium (VOLTAREN) 1 % GEL Apply topically 4 (four) times daily.    . DULoxetine (CYMBALTA) 60 MG capsule Take 60 mg by mouth daily.     Marland Kitchen guaiFENesin-dextromethorphan (ROBITUSSIN DM) 100-10 MG/5ML syrup Take 5 mLs by mouth every  4 (four) hours as needed for cough. 118 mL 0  . ipratropium-albuterol (DUONEB) 0.5-2.5 (3) MG/3ML SOLN Take 3 mLs by nebulization every 4 (four) hours as needed. 360 mL 3  . KLOR-CON M10 10 MEQ tablet TAKE 2 TABLETS BY MOUTH DAILY 30 tablet 0  . lidocaine (XYLOCAINE) 2 % solution Use as directed 15 mLs in the mouth or throat 3 (three) times daily as needed for mouth pain.  0  . lidocaine-prilocaine (EMLA) cream Apply cream 1 hour before chemotherapy treatment and place small peive of saran wrap over cream to protect clothing 30 g 3  . magic mouthwash SOLN Take 15 mLs by mouth 3 (three) times daily.  0  . metoprolol tartrate (LOPRESSOR) 50 MG tablet Take  1 tablet (50 mg total) by mouth 2 (two) times daily.    . Omega-3 Fatty Acids (FISH OIL PO) Take 1 tablet by mouth daily.    . ondansetron (ZOFRAN ODT) 4 MG disintegrating tablet Take 1 tablet (4 mg total) by mouth every 8 (eight) hours as needed for nausea or vomiting. (Patient not taking: Reported on 01/21/2020) 30 tablet 1  . pantoprazole (PROTONIX) 40 MG tablet Take 1 tablet (40 mg total) by mouth daily. 30 tablet 2  . polyethylene glycol (MIRALAX / GLYCOLAX) 17 g packet Take 17 g by mouth 2 (two) times daily. 14 each 0  . vitamin E 400 UNIT capsule Take 400 Units by mouth daily.      No current facility-administered medications for this visit.   Facility-Administered Medications Ordered in Other Visits  Medication Dose Route Frequency Provider Last Rate Last Admin  . heparin lock flush 100 unit/mL  500 Units Intravenous Once Cha Gomillion C, MD      . sodium chloride flush (NS) 0.9 % injection 10 mL  10 mL Intravenous PRN Nolon Stalls C, MD   10 mL at 04/05/17 1034  . sodium chloride flush (NS) 0.9 % injection 10 mL  10 mL Intravenous PRN Lequita Asal, MD   10 mL at 02/27/19 1033    Review of Systems  Constitutional: Positive for malaise/fatigue (sleeps 20 hrs/day). Negative for chills, diaphoresis, fever and weight loss (up  1 lb).       Feeling tired.  HENT: Positive for hearing loss (deaf). Negative for congestion, ear discharge, ear pain, nosebleeds, sinus pain, sore throat and tinnitus.        Trouble swallowing. Food gets stuck in throat. Throat sensitive to cold.  Eyes: Negative for blurred vision and double vision.  Respiratory: Positive for shortness of breath (worsening). Negative for cough, hemoptysis and sputum production.        On 2 liters/min of oxygen.  Cardiovascular: Negative for chest pain, palpitations and leg swelling.  Gastrointestinal: Positive for diarrhea (a few days ago). Negative for abdominal pain, blood in stool, constipation, heartburn, melena, nausea (on zofran as needed) and vomiting.       No appetite. Not eating or drinking a lot. Does not like Boost or Ensure.  Genitourinary: Negative for dysuria, frequency, hematuria and urgency.  Musculoskeletal: Negative for back pain, falls, joint pain, myalgias and neck pain.  Skin: Negative for itching and rash.  Neurological: Negative for dizziness, sensory change (baseline neuropathy unchanged; left hand 2nd and 3rd digits. neuropathy in sole of feet unchanged), weakness and headaches.  Endo/Heme/Allergies: Negative for environmental allergies and polydipsia. Does not bruise/bleed easily.  Psychiatric/Behavioral: Positive for depression. Negative for memory loss. The patient is not nervous/anxious and does not have insomnia.   All other systems reviewed and are negative.  Performance status (ECOG):  3  Vitals There were no vitals taken for this visit.   Physical Exam Vitals and nursing note reviewed.  Constitutional:      General: She is not in acute distress.    Appearance: Normal appearance. She is well-developed. She is not diaphoretic.     Interventions: Face mask in place.     Comments: Fatigued appearing woman sitting comfortably in a wheelchair in no acute distress.  She needs assistance getting up from the chair.  HENT:      Head: Normocephalic and atraumatic.     Comments: Short gray hair.    Nose:     Comments: Onida in place.  Mouth/Throat:     Mouth: Mucous membranes are moist. No oral lesions.     Pharynx: Oropharynx is clear. No oropharyngeal exudate.  Eyes:     General: No scleral icterus.    Extraocular Movements: Extraocular movements intact.     Conjunctiva/sclera: Conjunctivae normal.     Pupils: Pupils are equal, round, and reactive to light.     Comments: Hazel eyes.  Neck:     Vascular: No JVD.  Cardiovascular:     Rate and Rhythm: Regular rhythm. Tachycardia present.     Heart sounds: Normal heart sounds. No murmur heard.  No friction rub. No gallop.   Pulmonary:     Effort: Pulmonary effort is normal. No respiratory distress.     Breath sounds: No wheezing or rales.     Comments: Decreased breath sounds left side except for upper lobe.  Right side clear to auscultation.  No wheezes. Chest:     Chest wall: No tenderness.  Abdominal:     General: Bowel sounds are normal. There is no distension.     Palpations: Abdomen is soft. There is no mass.     Tenderness: There is no abdominal tenderness. There is no guarding or rebound.  Musculoskeletal:        General: No swelling or tenderness. Normal range of motion.     Cervical back: Normal range of motion and neck supple.     Left knee: No swelling.  Lymphadenopathy:     Head:     Right side of head: No preauricular, posterior auricular or occipital adenopathy.     Left side of head: No preauricular, posterior auricular or occipital adenopathy.     Cervical: No cervical adenopathy.     Upper Body:     Right upper body: No supraclavicular or axillary adenopathy.     Left upper body: No supraclavicular or axillary adenopathy.     Lower Body: No right inguinal adenopathy. No left inguinal adenopathy.  Skin:    General: Skin is warm and dry.     Coloration: Skin is not pale.     Findings: No bruising, erythema, lesion or rash.    Neurological:     Mental Status: She is alert and oriented to person, place, and time. Mental status is at baseline.  Psychiatric:        Behavior: Behavior normal.        Thought Content: Thought content normal.        Judgment: Judgment normal.    Imaging studies: 03/03/2016:  PET scanrevealed a hypermetabolic left lower lobe mass, bilateral pleural and pulmonary parenchymal nodular metastasis, a large pleural lesion invading the anterior left chest wall. There was activity in the superior left ocular orbit. There was a large lesion centered in the right iliac wing with soft tissue extension. There were hypermetabolic right external iliac lymph nodes 01/29/2017:  PET scanrevealed interval progression of hypermetabolic nodules in the right upper (13 mm compared to 9 mm; SUV 10) and left lower lobes (26 mm compared to 22 mm; SUV 7.5). There was new hypermetabolic focus of activity along the anterior left pleura although no underlying pleural or lung mass was evident. 06/06/2019:  Chest, abdomen, and pelvis CTrevealed an interval mass-like opacity in the superior aspect of the left parahilar lungwith an interval more prominent mass-like component involving the pleura laterally at the level with slightly more prominent extension into the left hila region, suspicious for recurrent or progressive lung cancer. There was mediastinal and bilateral  hilar adenopathyc/wmetastatic adenopathy or involvement with the patient's B-cell lymphoma. There was no evidence of metastatic disease or lymphoma involving the abdomen or pelvis. There was significantly improved ground-glass opacities in the right lung, minimal left pleural fluid(improved), andastable large hiatal hernia.There was stable enlarged thyroid gland containing heterogeneous nodules with substernal extension on the left.There was stable to slightly decreased size of the previously demonstrated right adrenal nodule and no significant  change in a small, poorly visualized left adrenal nodule. 06/17/2019:  PET scanrevealed a known left parahilar lung mass markedly hypermetabolic with associated hypermetabolic lymphadenopathy in both hilar regions, the mediastinum, andthe right thoracic inlet. There was hypermetabolism in the right submandibular gland, indeterminate. There was no hypermetabolic metastatic disease evident in the abdomen or pelvis. There was a stable bilateral adrenal adenomas since 01/29/2018. There were tiny left pleural effusion and a large hiatal hernia.There was a segment of transverse colon extendingup into the hiatal hernia without obstructive features.  12/02/2019:  Chest, abdomen and pelvis CT revealed interval resolution of mediastinal and hilar adenopathy c/w response to therapy. 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 There was increased moderate-sized dependent left pleural effusion with increased left lower lobe compressive atelectasis. There was a new 6 mm nodule in the superior segment of the right lower lobe, nonspecific, but potentially a metastasis. Attention on follow-up was recommended.  There was no evidence of metastatic disease in the abdomen or pelvis.  There was a moderate-sized hiatal hernia and bilateral adrenal adenomas.    No visits with results within 3 Day(s) from this visit.  Latest known visit with results is:  Infusion on 01/29/2020  Component Date Value Ref Range Status  . Magnesium 01/29/2020 1.6* 1.7 - 2.4 mg/dL Final   Performed at [facility], [address], [place], [place] [address]  . Sodium 01/29/2020 137  135 - 145 mmol/L Final  . Potassium 01/29/2020 3.8  3.5 - 5.1 mmol/L Final  . Chloride 01/29/2020 104  98 - 111 mmol/L Final  . CO2 01/29/2020 24  22 - 32 mmol/L Final  . Glucose, Bld 01/29/2020 140* 70 - 99 mg/dL Final   Glucose reference range  applies only to samples taken after fasting for at least 8 hours.  . BUN 01/29/2020 15  8 - 23 mg/dL Final  . Creatinine, Ser 01/29/2020 0.91  0.44 - 1.00 mg/dL Final  . Calcium 01/29/2020 8.2* 8.9 - 10.3 mg/dL Final  . Total Protein 01/29/2020 6.0* 6.5 - 8.1 g/dL Final  . Albumin 01/29/2020 2.0* 3.5 - 5.0 g/dL Final  . AST 01/29/2020 20  15 - 41 U/L Final  . ALT 01/29/2020 21  0 - 44 U/L Final  . Alkaline Phosphatase 01/29/2020 127* 38 - 126 U/L Final  . Total Bilirubin 01/29/2020 0.7  0.3 - 1.2 mg/dL Final  . GFR calc non Af Amer 01/29/2020 >60  >60 mL/min Final  . GFR calc Af Amer 01/29/2020 >60  >60 mL/min Final  . Anion gap 01/29/2020 9  5 - 15 Final   Performed at [facility], [address] [place], [place] [address]  . WBC 01/29/2020 7.0  4.0 - 10.5 K/uL Final  . RBC 01/29/2020 2.98* 3.87 - 5.11 MIL/uL Final  . Hemoglobin 01/29/2020 8.9* 12.0 - 15.0 g/dL Final  . HCT 01/29/2020 27.8* 36 - 46 % Final  . MCV 01/29/2020 93.3  80.0 - 100.0 fL Final  . [facility] 01/29/2020 29.9  26.0 - 34.0 pg Final  . MCHC 01/29/2020 32.0  30.0 - 36.0 g/dL Final  . RDW 01/29/2020 18.5* 11.5 - 15.5 % Final  . Platelets 01/29/2020 410* 150 - 400 K/uL Final  . nRBC 01/29/2020 0.0  0.0 - 0.2 % Final  . Neutrophils Relative % 01/29/2020 77  % Final  . Neutro Abs 01/29/2020 5.4  1.7 - 7.7 K/uL Final  . Lymphocytes Relative 01/29/2020 16  % Final  . Lymphs Abs 01/29/2020 1.1  0.7 - 4.0 K/uL Final  . Monocytes Relative 01/29/2020 5  % Final  . Monocytes Absolute 01/29/2020 0.3  0 - 1 K/uL Final  . Eosinophils Relative 01/29/2020 0  % Final  . Eosinophils Absolute 01/29/2020 0.0  0 - 0 K/uL Final  . Basophils Relative 01/29/2020 1  % Final  . Basophils Absolute 01/29/2020 0.1  0 - 0 K/uL Final  . Immature Granulocytes 01/29/2020 1  % Final  . Abs Immature Granulocytes 01/29/2020 0.09* 0.00 - 0.07 K/uL Final   Performed at [facility], [address], [place], [place] [address]     Assessment:  Viktoria Gruetzmacher is a 79 y.o. female withstage IVB B-cell lymphoma, unclassifiable, with features intermediate between diffuse large B-cell lymphoma and classical Hodgkin's lymphoma ("gray zone" lymphoma). She underwent right iliac wing bone/bone marrow biopsy on 03/08/2016.  PET scanon 03/03/2016 revealed a hypermetabolic left lower lobe mass, bilateral pleural and pulmonary parenchymal nodular metastasis, a large pleural lesion invading the anterior left chest wall. There was activity in the superior left ocular orbit. There was a large lesion centered in the right iliac wing with soft tissue extension. There were hypermetabolic right external iliac lymph nodes  [place] 04/04/2016 revealed an EF of 55-60%. Hepatitis B and C testing was negative on 03/30/2016. G6PD assay was normal. She declined LP with MTX prophylaxis.  She received 6 cycles of mini-RCHOP(04/07/2016 - 07/21/2016). She has had some transient fingertip numbness.  She received radiationto the right iliac wing, from 10/11/2016 - 11/01/2016. She received 3000 cGy over 3 weeks.  She has squamous cell lung cancer. CT guided left lower lobe nodule biopsyon 02/19/2017 confirmed squamous cell carcinoma. She underwent SBRT08/27/2018 - 04/09/2017. She received 5000 cGy in 5 fractions to the left lower lobe lesion. She underwent SBRTto a RUL lesion from 05/23/2017 - 06/18/2017.  CT guided left upper lobe nodule biopsyon 02/20/2018 revealedsquamous cell lung cancer. She received SBRTfrom 04/09/2018 - 04/15/2018. She received 6000 cGy in 5 fractions.   Foundation Oneon the lung biopsy on 03/15/2018 revealed MS-stable, tumor mutational burden 5 Muts/Mb, AKT2 amplification, CCNE1 amplification, CREBBP R1446L, KDR amplification, KIT amplification, PDGFRA amplification, PTEN loss exons 2-4, and WS56 splice site 812+7N>T. Marland Kitchen There were no alterations in EGFR, KRAS, RET, ALK, MET, ERBB2, BRAF,  ROS1. PDL-1IHC analysis revealed a TPS score of 50%.  Bronchoscopyon 05/30/2018 revealed no endobronchial lesions. The 2 cm pre-carinal lymph node was biopsied. Pathology revealed metastatic non-small cell carcinoma, favor squamous cell carcinoma.  CT guided right paraspinal lesion biopsyon 10/16/2017 was compatible with involvement by the patient's previously diagnosed lymphoma. The case was referred to [facility] hematopathology. The neoplastic cells were largely CD30 negative. Repeat CD30 immunohistochemistry faintly stained >10% of the large atypical cells. CD20 stained the scattered large atypical cells and few small cells.   She received 3 cycles of brentuximab vedotin(11/23/2017 - 01/11/2018).Her lymphoma progressed.  Shereceived14cycles of pembrolizumab(06/14/2018 -05/21/2019).   Shereceived4000cGyto the left hilar regionfrom12/14/2020 -08/15/2019.  She received 4 cycles ofpembolizumab, carboplatin  and Abraxane (08/29/2019 - 11/05/2019). She experienced tinnitus after her first day of chemotherapy. Tinnitus was minor with cycle #2.  She received day 1 of cycle #1 gemcitabine (01/01/2020).  She declined further gemcitabine.  She received day 1 of cycle #1 navelbine (01/22/2020).  Performance status has declined.  Chest, abdomen and pelvis CT on 12/02/2019 revealed interval resolution of mediastinal and hilar adenopathy c/w response to therapy. 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 There was increased moderate-sized dependent left pleural effusion with increased left lower lobe compressive atelectasis. There was a new 6 mm nodule in the superior segment of the right lower lobe, nonspecific, but potentially a metastasis. Attention on follow-up was recommended.  There was no evidence of metastatic disease in the abdomen or pelvis.  There was a moderate-sized  hiatal hernia and bilateral adrenal adenomas.   Ultrasound guided left thoracentesis on 12/05/2019 revealed non-small cell carcinoma.   She hasiron deficiency anemia. Ferritinhas been followed: 14 on 05/06/2018,23 on 07/26/2018, and 59 on 11/01/2018. She denies any bleeding.  She has a grade I neuropathy. B12 and folate were normal on 12/06/2017. She has severe bilateral knee osteoarthritis. She has received steroid injections and Monovisc (hyaluronate derivatives) without improvement.   She was admitted to [facility] from 12/09/2019-12/11/2019 with chills, diaphoresis, tachycardia, and tachypnea for r/o sepsis. She was treated with broad spectrum antibiotics (vancomycin and Cefepime). Cultures remained negative.  She was discharged on Augmentin.  She was switched to Levaquin on 12/25/2019.  She was admitted to [facility] from 01/03/2020 - 01/09/2020 with swollen lips and mouth burning felt secondary to medication (? gemcitabine), chronic respiratory failure, pneumonia, and new onset A. fib with RVR.  She was treated with Cefepime then switched to Cefdinir, Eliquis, and metoprolol.  She was discharged to rehabilitation.  She has malignancy associated hypercalcemia.  She received Zometa on 01/14/2020.  She has atrial fibrillation and is on Eliquis.  She has NYHA class II chronic heart failure.  Echo on 01/04/2020 revealed an EF of 60-65%.  She is deafand requires an interpreter.  Symptomatically, she has progressive weakness.  She is sleeping 20 hours/day.  She becomes short of breath with minimal exertion.  She is eating little. Performance status is 3.  Plan: 1.   Labs today: CBC with diff, CMP, Mg, hold tube 2.Pearline Cables zone lymphoma Clinically, her lymphoma is controlled.. Exam continues to reveal no adenopathy or hepatosplenomegaly She has received 6 cycles of mini-RCOP, 3 cycles ofbrentuximab vedotin, and 16cycles of pembrolizumab. PET scan on 06/17/2019 revealed  progressive disease in the chest felt secondary toprogressive lung cancer. She is s/p6additional cycle of maintenance pembrolizumab (last 01/01/2020).  3. 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 Patient is s/p SBRT to left upper lobe and left lower lobe nodules. Patient is s/p 15cyclesof pembrolizumab. Patient is s/p palliative radiation to the right hilar region (completed 08/15/2019). She is s/p4cycles of carboplatin and Abraxane (08/29/2019 - 11/05/2019). Chest, abdomen and pelvis CT scan on 12/02/2019 revealed a new moderate left-sided effusion.  Thoracentesis on 12/05/2019 confirmed non-small cell lung cancer.  No clinical trial available at [facility].  She received day 1 of cycle #1 gemcitabine.  Course was complicated  She received day 1 of cycle #1 navelbine on 01/22/2020.  Her performance status is poor.  She is sleeping 20 hours/day.  She is unable to care for her ADLs.  She is eating minimally.  She becomes short of breath with minimal exertion.  We discussed supportive care and transition to Hospice.   If she improves, we discussed readdressing treatment in the future.  Multiple questions were asked and answered. 4.Post obstructive process  Clinically, she is doing poorly.  She has minimal respiratory reserve.  With ambulation, oxygen saturation drops to 79-80%.  We discussed portable oxygen.  Dr Patsey Berthold contacted. 5.   Malignancy associated hypercalcemia  Calcium 10.5 (corrected 11.86) on Feb 17, 2020.   She received Zometa.  Calcium 8.4 (corrected 10.01) today.  Discuss follow-up calcium in 2 weeks.  Anticipate need for additional Zometa in the future. 6.   Hypokalemia, resolved  Potassium 4.1.  Patient currently on potassium 10 meq BID.  Decrease potassium to 10 meq q day.  Patient's family decreasing unnecessart medications secondary to issues  with swallowing. 7.   Hypomagnesemia, resolved  Magnesium 1.9.  Hold magnesium for now. 8.   Hospice consult. 9.   RTC in 2 weeks for MD assessment, labs (CBC with diff, CMP, Mg), and +/- Zometa.  I discussed the assessment and treatment plan with the patient.  The patient was provided an opportunity to ask questions and all were answered.  The patient agreed with the plan and demonstrated an understanding of the instructions.  The patient was advised to call back if the symptoms worsen or if the condition fails to improve as anticipated.  I provided 39 minutes of face-to-face time during this encounter and > 50% was spent counseling as documented under my assessment and plan.  An additional 5 minutes were spent reviewing her chart (Epic and Care Everywhere) including notes, labs, and imaging studies.  I discussed ambulatory oximetry with Dr Patsey Berthold today.   Lequita Asal, MD, PhD    02/03/2020, 11:18 AM  I, De Burrs, am acting as Education administrator for [employer]. Mike Gip, MD, PhD.  I, Domani Bakos C. Mike Gip, MD, have reviewed the above documentation for accuracy and completeness, and I agree with the above.

## 2020-02-03 NOTE — Telephone Encounter (Signed)
Call received from patient daughter [name] this morning asking about the documents that we discussed last week. We spoke for 20 minutes. We reviewed the use of the MOST form that she should keep it with her for advance directives for EMS or other medical appointments. We discussed their expectation that they would be receiving custodial help from palliative. Palliative is a consultative service and hospice program would be able to bring in supportive care. We talked about goals of care and patients desire to continue chemotherapy. Precious Bard will discuss tomorrow with oncology regarding side effects And benefits of this current chemo regimen. We discussed if patient were to elect to stop treatment that it would be appropriate to refer hospice at this time. [name] asked that I discuss the above with her brother, Ellyanna Holton. Attempted to call at 3:15 PM voicemail but no ability to leave a message. They will be seen in clinic tomorrow and I advise them to talk candidly with oncology about goals of care.

## 2020-02-04 ENCOUNTER — Telehealth: Payer: Self-pay

## 2020-02-04 ENCOUNTER — Encounter: Payer: Self-pay | Admitting: Hematology and Oncology

## 2020-02-04 ENCOUNTER — Inpatient Hospital Stay: Payer: Medicare HMO | Admitting: Hematology and Oncology

## 2020-02-04 ENCOUNTER — Inpatient Hospital Stay: Payer: Medicare HMO

## 2020-02-04 ENCOUNTER — Other Ambulatory Visit: Payer: Medicare HMO

## 2020-02-04 ENCOUNTER — Ambulatory Visit: Payer: Medicare HMO

## 2020-02-04 ENCOUNTER — Other Ambulatory Visit: Payer: Self-pay

## 2020-02-04 ENCOUNTER — Ambulatory Visit: Payer: Medicare HMO | Admitting: Hematology and Oncology

## 2020-02-04 VITALS — BP 103/77 | HR 110 | Temp 96.5°F | Resp 20

## 2020-02-04 DIAGNOSIS — Z5111 Encounter for antineoplastic chemotherapy: Secondary | ICD-10-CM

## 2020-02-04 DIAGNOSIS — C8330 Diffuse large B-cell lymphoma, unspecified site: Secondary | ICD-10-CM | POA: Diagnosis not present

## 2020-02-04 DIAGNOSIS — Z7189 Other specified counseling: Secondary | ICD-10-CM

## 2020-02-04 DIAGNOSIS — G620 Drug-induced polyneuropathy: Secondary | ICD-10-CM

## 2020-02-04 DIAGNOSIS — C8518 Unspecified B-cell lymphoma, lymph nodes of multiple sites: Secondary | ICD-10-CM | POA: Diagnosis not present

## 2020-02-04 DIAGNOSIS — C3492 Malignant neoplasm of unspecified part of left bronchus or lung: Secondary | ICD-10-CM

## 2020-02-04 DIAGNOSIS — D649 Anemia, unspecified: Secondary | ICD-10-CM

## 2020-02-04 DIAGNOSIS — T451X5A Adverse effect of antineoplastic and immunosuppressive drugs, initial encounter: Secondary | ICD-10-CM

## 2020-02-04 LAB — CBC WITH DIFFERENTIAL/PLATELET
Abs Immature Granulocytes: 0.14 10*3/uL — ABNORMAL HIGH (ref 0.00–0.07)
Basophils Absolute: 0 10*3/uL (ref 0.0–0.1)
Basophils Relative: 0 %
Eosinophils Absolute: 0 10*3/uL (ref 0.0–0.5)
Eosinophils Relative: 0 %
HCT: 29.9 % — ABNORMAL LOW (ref 36.0–46.0)
Hemoglobin: 9.7 g/dL — ABNORMAL LOW (ref 12.0–15.0)
Immature Granulocytes: 2 %
Lymphocytes Relative: 19 %
Lymphs Abs: 1.3 10*3/uL (ref 0.7–4.0)
MCH: 29.8 pg (ref 26.0–34.0)
MCHC: 32.4 g/dL (ref 30.0–36.0)
MCV: 91.7 fL (ref 80.0–100.0)
Monocytes Absolute: 1 10*3/uL (ref 0.1–1.0)
Monocytes Relative: 15 %
Neutro Abs: 4.2 10*3/uL (ref 1.7–7.7)
Neutrophils Relative %: 64 %
Platelets: 504 10*3/uL — ABNORMAL HIGH (ref 150–400)
RBC: 3.26 MIL/uL — ABNORMAL LOW (ref 3.87–5.11)
RDW: 19.3 % — ABNORMAL HIGH (ref 11.5–15.5)
WBC: 6.7 10*3/uL (ref 4.0–10.5)
nRBC: 0 % (ref 0.0–0.2)

## 2020-02-04 LAB — COMPREHENSIVE METABOLIC PANEL
ALT: 28 U/L (ref 0–44)
AST: 28 U/L (ref 15–41)
Albumin: 2.1 g/dL — ABNORMAL LOW (ref 3.5–5.0)
Alkaline Phosphatase: 144 U/L — ABNORMAL HIGH (ref 38–126)
Anion gap: 8 (ref 5–15)
BUN: 17 mg/dL (ref 8–23)
CO2: 26 mmol/L (ref 22–32)
Calcium: 8.4 mg/dL — ABNORMAL LOW (ref 8.9–10.3)
Chloride: 105 mmol/L (ref 98–111)
Creatinine, Ser: 0.98 mg/dL (ref 0.44–1.00)
GFR calc Af Amer: >60 mL/min (ref >60)
GFR calc non Af Amer: 56 mL/min — ABNORMAL LOW (ref >60)
Glucose, Bld: 121 mg/dL — ABNORMAL HIGH (ref 70–99)
Potassium: 4.1 mmol/L (ref 3.5–5.1)
Sodium: 139 mmol/L (ref 135–145)
Total Bilirubin: 0.5 mg/dL (ref 0.3–1.2)
Total Protein: 6.2 g/dL — ABNORMAL LOW (ref 6.5–8.1)

## 2020-02-04 LAB — IRON AND TIBC
Iron: 10 ug/dL — ABNORMAL LOW (ref 28–170)
Saturation Ratios: 6 % — ABNORMAL LOW (ref 10.4–31.8)
TIBC: 161 ug/dL — ABNORMAL LOW (ref 250–450)
UIBC: 151 ug/dL

## 2020-02-04 LAB — FERRITIN: Ferritin: 565 ng/mL — ABNORMAL HIGH (ref 11–307)

## 2020-02-04 LAB — VITAMIN B12: Vitamin B-12: 1496 pg/mL — ABNORMAL HIGH (ref 180–914)

## 2020-02-04 LAB — FOLATE: Folate: 7.6 ng/mL (ref >5.9)

## 2020-02-04 LAB — MAGNESIUM: Magnesium: 1.9 mg/dL (ref 1.7–2.4)

## 2020-02-04 MED ORDER — SODIUM CHLORIDE 0.9% FLUSH
10.0000 mL | INTRAVENOUS | Status: DC | PRN
  Administered 2020-02-04: 10 mL via INTRAVENOUS
  Filled 2020-02-04: qty 10

## 2020-02-04 MED ORDER — HEPARIN SOD (PORK) LOCK FLUSH 100 UNIT/ML IV SOLN
500.0000 [IU] | Freq: Once | INTRAVENOUS | Status: AC
  Administered 2020-02-04: 500 [IU] via INTRAVENOUS
  Filled 2020-02-04: qty 5

## 2020-02-04 NOTE — Telephone Encounter (Signed)
The patient daughter was informed to hold the magnesium for now until futher notice, Per Dr Mike Gip. Magnesium is good today. She was understanding and agreeable.

## 2020-02-04 NOTE — Telephone Encounter (Signed)
Dr. Patsey Berthold requested that I contact pt's daughter, [name] and clarify oxygen usage and tanks.  [name] stated that pt only has a bedside concentrator and one large tank for emergency use.  I spoke to [place] with [facility], who confirmed that pt is only prescribed nighttime oxygen. Per Margarita Grizzle, an order was not placed for daytime oxygen during pt's recent admission, although pt wore oxygen 24/7 during admission per [name].   Will route to Dr. Patsey Berthold to make aware.

## 2020-02-04 NOTE — Progress Notes (Signed)
Increased trouble in breathing since last night. No appetite and trouble swallowing sometimes and gets gagged on food. Patient is on 2L of oxygen.

## 2020-02-04 NOTE — Telephone Encounter (Signed)
-----   Message from Lequita Asal, MD sent at 02/04/2020  1:16 PM EDT ----- Regarding: Please call daughter  Magnesium level is good.  She can hold magnesium for now.  M ----- Message ----- From: Buel Ream, [facility] [place] Sent: 02/04/2020  11:47 AM EDT To: Lequita Asal, MD

## 2020-02-04 NOTE — Progress Notes (Signed)
Per Dr Mike Gip I told daughter [name] to stop giving oral magnesium at this time. She understands directive.

## 2020-02-04 NOTE — Telephone Encounter (Signed)
On her most recent admission she required oxygen 24/7.  On today's visit at the cancer center and medicine saturations were down to 79 to 80% on room air taking just only 6 steps.  She recovered quickly on supplemental oxygen.  I suspect she will need 2 L/min.  She will come for a follow-up visit on 21 July but will need a portable source to physicians appointments.  Lets try to do a qualification in the office on Friday, 9 July.

## 2020-02-04 NOTE — Telephone Encounter (Signed)
Toni Griffith is aware of below recommendations and voiced her understanding.  Pt has been scheduled for qualifying walk on 02/06/2020 at 10:00. Nothing further is needed.

## 2020-02-06 ENCOUNTER — Ambulatory Visit: Payer: Medicare HMO

## 2020-02-13 ENCOUNTER — Telehealth: Payer: Self-pay | Admitting: *Deleted

## 2020-02-13 ENCOUNTER — Other Ambulatory Visit: Payer: Self-pay | Admitting: Hematology and Oncology

## 2020-02-13 ENCOUNTER — Other Ambulatory Visit: Payer: Medicare HMO | Admitting: Primary Care

## 2020-02-13 DIAGNOSIS — E871 Hypo-osmolality and hyponatremia: Secondary | ICD-10-CM

## 2020-02-18 ENCOUNTER — Other Ambulatory Visit: Payer: Medicare HMO

## 2020-02-18 ENCOUNTER — Ambulatory Visit: Payer: Medicare HMO | Admitting: Hematology and Oncology

## 2020-02-18 ENCOUNTER — Ambulatory Visit: Payer: Medicare HMO

## 2020-02-18 ENCOUNTER — Ambulatory Visit: Payer: Medicare HMO | Admitting: Pulmonary Disease

## 2020-02-29 NOTE — Telephone Encounter (Signed)
   Ref Range & Units 9 d ago 2 wk ago  Potassium 3.5 - 5.1 mmol/L 4.1  3.8

## 2020-02-29 NOTE — Telephone Encounter (Signed)
Call from [place] with Hospice to inform us of patient death at 12:56 PM today

## 2020-02-29 DEATH — deceased

## 2020-03-24 ENCOUNTER — Ambulatory Visit: Payer: Medicare HMO | Admitting: Family

## 2024-05-06 NOTE — Progress Notes (Signed)
    Assessment & Plan:  There are no diagnoses linked to this encounter.  Patient Instructions  You can return here as needed.    Please note: late entry documentation due to logistical difficulties during COVID-19 pandemic. This note is filed for information purposes only, and is not intended to be used for billing, nor does it represent the full scope/nature of the visit in question. Please see any associated scanned media linked to date of encounter for additional pertinent information.  Subjective:    HPI: Toni Griffith is a 83 y.o. female presenting to the pulmonology clinic on 06/19/2019 with report of: Follow-up (PET scan 06/17/2019. pt reports of prod cough with white mucus and wheezing, sob with exertion.)     Outpatient Encounter Medications as of 06/19/2019  Medication Sig   acetaminophen  (TYLENOL ) 500 MG tablet Take 500 mg by mouth every 6 (six) hours as needed for moderate pain.  (Patient not taking: Reported on 02/04/2020)   aspirin  EC 81 MG tablet Take 81 mg by mouth daily.   atorvastatin  (LIPITOR ) 80 MG tablet Take 80 mg by mouth every evening.    b complex vitamins tablet Take 1 tablet by mouth daily. (Patient not taking: Reported on 02/04/2020)   Cholecalciferol  (VITAMIN D3) 2000 units capsule Take 1,000 Units by mouth daily.  (Patient not taking: Reported on 02/04/2020)   lidocaine -prilocaine  (EMLA ) cream Apply cream 1 hour before chemotherapy treatment and place small peive of saran wrap over cream to protect clothing   ondansetron  (ZOFRAN  ODT) 4 MG disintegrating tablet Take 1 tablet (4 mg total) by mouth every 8 (eight) hours as needed for nausea or vomiting.   vitamin E  400 UNIT capsule Take 400 Units by mouth daily.  (Patient not taking: Reported on 02/04/2020)   [DISCONTINUED] allopurinol  (ZYLOPRIM ) 300 MG tablet TAKE 1 TABLET BY MOUTH EVERY DAY   [DISCONTINUED] DULoxetine  (CYMBALTA ) 30 MG capsule Take 1 capsule (30mg ) daily for 7 days then increase to 2  capsules (60mg ) daily.   [DISCONTINUED] ferrous sulfate 325 (65 FE) MG tablet Take 650 mg by mouth daily with breakfast.   [DISCONTINUED] gabapentin  (NEURONTIN ) 100 MG capsule Take 2 capsules (200 mg total) by mouth at bedtime.   [DISCONTINUED] pantoprazole  (PROTONIX ) 40 MG tablet Take 40 mg by mouth daily.    [DISCONTINUED] vitamin B-12 (CYANOCOBALAMIN ) 500 MCG tablet Take 500 mcg by mouth daily.   Facility-Administered Encounter Medications as of 06/19/2019  Medication   heparin  lock flush 100 unit/mL   sodium chloride  flush (NS) 0.9 % injection 10 mL   sodium chloride  flush (NS) 0.9 % injection 10 mL      Objective:   Vitals:   06/19/19 1111  BP: 132/72  Pulse: 99  Temp: (!) 97.4 F (36.3 C)  Height: 5' 3 (1.6 m)  Weight: 173 lb (78.5 kg)  SpO2: 96%  TempSrc: Temporal  BMI (Calculated): 30.65     Physical exam documentation is limited by delayed entry of information.
